# Patient Record
Sex: Male | Born: 1945
Health system: Southern US, Community
[De-identification: ages and names within clinical notes are randomized; demographics above are authoritative.]

## PROBLEM LIST (undated history)

## (undated) DIAGNOSIS — S060XAA Concussion with loss of consciousness status unknown, initial encounter: Secondary | ICD-10-CM

## (undated) DIAGNOSIS — K4090 Unilateral inguinal hernia, without obstruction or gangrene, not specified as recurrent: Secondary | ICD-10-CM

## (undated) DIAGNOSIS — M353 Polymyalgia rheumatica: Secondary | ICD-10-CM

## (undated) DIAGNOSIS — M1990 Unspecified osteoarthritis, unspecified site: Secondary | ICD-10-CM

## (undated) DIAGNOSIS — R0600 Dyspnea, unspecified: Secondary | ICD-10-CM

## (undated) DIAGNOSIS — K219 Gastro-esophageal reflux disease without esophagitis: Secondary | ICD-10-CM

## (undated) DIAGNOSIS — S060X9A Concussion with loss of consciousness of unspecified duration, initial encounter: Secondary | ICD-10-CM

## (undated) DIAGNOSIS — Z8679 Personal history of other diseases of the circulatory system: Secondary | ICD-10-CM

## (undated) DIAGNOSIS — I480 Paroxysmal atrial fibrillation: Secondary | ICD-10-CM

## (undated) DIAGNOSIS — Z9889 Other specified postprocedural states: Secondary | ICD-10-CM

## (undated) DIAGNOSIS — I10 Essential (primary) hypertension: Secondary | ICD-10-CM

## (undated) DIAGNOSIS — R112 Nausea with vomiting, unspecified: Secondary | ICD-10-CM

## (undated) DIAGNOSIS — Z91018 Allergy to other foods: Secondary | ICD-10-CM

## (undated) DIAGNOSIS — E785 Hyperlipidemia, unspecified: Secondary | ICD-10-CM

## (undated) DIAGNOSIS — G4730 Sleep apnea, unspecified: Secondary | ICD-10-CM

## (undated) DIAGNOSIS — T4145XA Adverse effect of unspecified anesthetic, initial encounter: Secondary | ICD-10-CM

## (undated) DIAGNOSIS — I499 Cardiac arrhythmia, unspecified: Secondary | ICD-10-CM

## (undated) DIAGNOSIS — I341 Nonrheumatic mitral (valve) prolapse: Secondary | ICD-10-CM

## (undated) DIAGNOSIS — J8410 Pulmonary fibrosis, unspecified: Secondary | ICD-10-CM

## (undated) DIAGNOSIS — T8859XA Other complications of anesthesia, initial encounter: Secondary | ICD-10-CM

## (undated) DIAGNOSIS — R7303 Prediabetes: Secondary | ICD-10-CM

## (undated) DIAGNOSIS — R079 Chest pain, unspecified: Secondary | ICD-10-CM

## (undated) DIAGNOSIS — K929 Disease of digestive system, unspecified: Secondary | ICD-10-CM

## (undated) HISTORY — DX: Chest pain, unspecified: R07.9

## (undated) HISTORY — DX: Unilateral inguinal hernia, without obstruction or gangrene, not specified as recurrent: K40.90

## (undated) HISTORY — PX: APPENDECTOMY: SHX54

## (undated) HISTORY — PX: BACK SURGERY: SHX140

## (undated) HISTORY — PX: TONSILLECTOMY: SUR1361

## (undated) HISTORY — PX: DG GREAT TOE LEFT FOOT: HXRAD1656

## (undated) HISTORY — PX: EYE SURGERY: SHX253

## (undated) HISTORY — DX: Paroxysmal atrial fibrillation: I48.0

## (undated) HISTORY — PX: COLONOSCOPY: SHX174

---
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
## 1997-05-14 HISTORY — PX: OTHER SURGICAL HISTORY: SHX169

## 1999-05-15 HISTORY — PX: SHOULDER OPEN ROTATOR CUFF REPAIR: SHX2407

## 1999-05-15 HISTORY — PX: CARDIAC CATHETERIZATION: SHX172

## 1999-05-23 ENCOUNTER — Inpatient Hospital Stay (HOSPITAL_COMMUNITY): Admission: EM | Admit: 1999-05-23 | Discharge: 1999-05-25 | Payer: Self-pay | Admitting: Emergency Medicine

## 2001-11-17 ENCOUNTER — Encounter: Payer: Self-pay | Admitting: Family Medicine

## 2001-11-17 ENCOUNTER — Ambulatory Visit (HOSPITAL_COMMUNITY): Admission: RE | Admit: 2001-11-17 | Discharge: 2001-11-17 | Payer: Self-pay | Admitting: Family Medicine

## 2001-11-18 ENCOUNTER — Encounter: Payer: Self-pay | Admitting: Family Medicine

## 2001-11-18 ENCOUNTER — Ambulatory Visit (HOSPITAL_COMMUNITY): Admission: RE | Admit: 2001-11-18 | Discharge: 2001-11-18 | Payer: Self-pay | Admitting: Family Medicine

## 2001-12-31 ENCOUNTER — Encounter: Payer: Self-pay | Admitting: Orthopedic Surgery

## 2001-12-31 ENCOUNTER — Ambulatory Visit (HOSPITAL_COMMUNITY): Admission: RE | Admit: 2001-12-31 | Discharge: 2001-12-31 | Payer: Self-pay | Admitting: Orthopedic Surgery

## 2002-05-05 ENCOUNTER — Encounter: Payer: Self-pay | Admitting: Family Medicine

## 2002-05-05 ENCOUNTER — Ambulatory Visit (HOSPITAL_COMMUNITY): Admission: RE | Admit: 2002-05-05 | Discharge: 2002-05-05 | Payer: Self-pay | Admitting: Family Medicine

## 2002-05-14 HISTORY — PX: CHOLECYSTECTOMY: SHX55

## 2002-07-02 ENCOUNTER — Ambulatory Visit (HOSPITAL_COMMUNITY): Admission: RE | Admit: 2002-07-02 | Discharge: 2002-07-02 | Payer: Self-pay | Admitting: General Surgery

## 2003-02-01 ENCOUNTER — Ambulatory Visit (HOSPITAL_COMMUNITY): Admission: RE | Admit: 2003-02-01 | Discharge: 2003-02-01 | Payer: Self-pay | Admitting: Family Medicine

## 2003-02-01 ENCOUNTER — Encounter: Payer: Self-pay | Admitting: Family Medicine

## 2003-03-12 ENCOUNTER — Ambulatory Visit (HOSPITAL_COMMUNITY): Admission: RE | Admit: 2003-03-12 | Discharge: 2003-03-12 | Payer: Self-pay | Admitting: Neurological Surgery

## 2003-03-23 ENCOUNTER — Encounter (HOSPITAL_COMMUNITY): Admission: RE | Admit: 2003-03-23 | Discharge: 2003-04-22 | Payer: Self-pay | Admitting: Neurological Surgery

## 2003-06-10 ENCOUNTER — Ambulatory Visit (HOSPITAL_COMMUNITY): Admission: RE | Admit: 2003-06-10 | Discharge: 2003-06-10 | Payer: Self-pay | Admitting: Internal Medicine

## 2003-06-17 ENCOUNTER — Encounter (HOSPITAL_COMMUNITY): Admission: RE | Admit: 2003-06-17 | Discharge: 2003-07-17 | Payer: Self-pay | Admitting: Neurological Surgery

## 2003-07-19 ENCOUNTER — Encounter (HOSPITAL_COMMUNITY): Admission: RE | Admit: 2003-07-19 | Discharge: 2003-08-18 | Payer: Self-pay | Admitting: Neurological Surgery

## 2004-04-24 ENCOUNTER — Ambulatory Visit (HOSPITAL_COMMUNITY): Admission: RE | Admit: 2004-04-24 | Discharge: 2004-04-24 | Payer: Self-pay | Admitting: Family Medicine

## 2004-10-06 ENCOUNTER — Ambulatory Visit (HOSPITAL_COMMUNITY): Admission: RE | Admit: 2004-10-06 | Discharge: 2004-10-06 | Payer: Self-pay | Admitting: Family Medicine

## 2005-08-13 ENCOUNTER — Ambulatory Visit (HOSPITAL_COMMUNITY): Admission: RE | Admit: 2005-08-13 | Discharge: 2005-08-13 | Payer: Self-pay | Admitting: Family Medicine

## 2005-09-28 ENCOUNTER — Encounter (HOSPITAL_COMMUNITY): Admission: RE | Admit: 2005-09-28 | Discharge: 2005-10-18 | Payer: Self-pay | Admitting: Orthopedic Surgery

## 2005-10-22 ENCOUNTER — Ambulatory Visit: Payer: Self-pay | Admitting: Internal Medicine

## 2005-10-22 ENCOUNTER — Observation Stay (HOSPITAL_COMMUNITY): Admission: EM | Admit: 2005-10-22 | Discharge: 2005-10-23 | Payer: Self-pay | Admitting: Emergency Medicine

## 2005-10-25 ENCOUNTER — Encounter (HOSPITAL_COMMUNITY): Admission: RE | Admit: 2005-10-25 | Discharge: 2005-11-24 | Payer: Self-pay | Admitting: Orthopedic Surgery

## 2005-11-26 ENCOUNTER — Encounter (HOSPITAL_COMMUNITY): Admission: RE | Admit: 2005-11-26 | Discharge: 2005-12-26 | Payer: Self-pay | Admitting: Orthopedic Surgery

## 2006-01-10 ENCOUNTER — Ambulatory Visit (HOSPITAL_COMMUNITY): Admission: RE | Admit: 2006-01-10 | Discharge: 2006-01-10 | Payer: Self-pay | Admitting: Family Medicine

## 2006-01-11 ENCOUNTER — Encounter (HOSPITAL_COMMUNITY): Admission: RE | Admit: 2006-01-11 | Discharge: 2006-02-09 | Payer: Self-pay | Admitting: Family Medicine

## 2006-12-05 ENCOUNTER — Ambulatory Visit (HOSPITAL_COMMUNITY): Admission: RE | Admit: 2006-12-05 | Discharge: 2006-12-05 | Payer: Self-pay | Admitting: Gastroenterology

## 2007-01-22 ENCOUNTER — Ambulatory Visit (HOSPITAL_COMMUNITY): Admission: RE | Admit: 2007-01-22 | Discharge: 2007-01-22 | Payer: Self-pay | Admitting: Surgery

## 2007-01-22 ENCOUNTER — Encounter (INDEPENDENT_AMBULATORY_CARE_PROVIDER_SITE_OTHER): Payer: Self-pay | Admitting: Surgery

## 2007-04-21 ENCOUNTER — Ambulatory Visit (HOSPITAL_COMMUNITY): Admission: RE | Admit: 2007-04-21 | Discharge: 2007-04-21 | Payer: Self-pay | Admitting: Family Medicine

## 2007-12-01 ENCOUNTER — Encounter: Admission: RE | Admit: 2007-12-01 | Discharge: 2007-12-01 | Payer: Self-pay | Admitting: Family Medicine

## 2007-12-03 ENCOUNTER — Encounter: Admission: RE | Admit: 2007-12-03 | Discharge: 2007-12-03 | Payer: Self-pay | Admitting: Family Medicine

## 2008-01-06 ENCOUNTER — Encounter: Admission: RE | Admit: 2008-01-06 | Discharge: 2008-01-06 | Payer: Self-pay | Admitting: Gastroenterology

## 2008-01-23 ENCOUNTER — Encounter: Admission: RE | Admit: 2008-01-23 | Discharge: 2008-01-23 | Payer: Self-pay | Admitting: Gastroenterology

## 2008-01-29 ENCOUNTER — Ambulatory Visit (HOSPITAL_COMMUNITY): Admission: RE | Admit: 2008-01-29 | Discharge: 2008-01-29 | Payer: Self-pay | Admitting: Gastroenterology

## 2009-02-14 ENCOUNTER — Ambulatory Visit (HOSPITAL_COMMUNITY): Admission: RE | Admit: 2009-02-14 | Discharge: 2009-02-14 | Payer: Self-pay | Admitting: Family Medicine

## 2009-02-15 ENCOUNTER — Ambulatory Visit (HOSPITAL_COMMUNITY): Admission: RE | Admit: 2009-02-15 | Discharge: 2009-02-15 | Payer: Self-pay | Admitting: Family Medicine

## 2009-04-04 ENCOUNTER — Ambulatory Visit (HOSPITAL_COMMUNITY): Admission: RE | Admit: 2009-04-04 | Discharge: 2009-04-04 | Payer: Self-pay | Admitting: Family Medicine

## 2010-03-14 ENCOUNTER — Ambulatory Visit: Payer: Self-pay | Admitting: Internal Medicine

## 2010-03-20 ENCOUNTER — Ambulatory Visit (HOSPITAL_COMMUNITY): Admission: RE | Admit: 2010-03-20 | Discharge: 2010-03-20 | Payer: Self-pay | Admitting: Internal Medicine

## 2010-06-07 ENCOUNTER — Ambulatory Visit (HOSPITAL_COMMUNITY)
Admission: RE | Admit: 2010-06-07 | Discharge: 2010-06-07 | Payer: Self-pay | Source: Home / Self Care | Attending: Internal Medicine | Admitting: Internal Medicine

## 2010-06-20 ENCOUNTER — Ambulatory Visit (INDEPENDENT_AMBULATORY_CARE_PROVIDER_SITE_OTHER): Payer: 59 | Admitting: Internal Medicine

## 2010-06-20 DIAGNOSIS — R197 Diarrhea, unspecified: Secondary | ICD-10-CM

## 2010-06-26 NOTE — Consult Note (Signed)
NAMEMOISES, TERPSTRA             ACCOUNT NO.:  [id number]  MEDICAL RECORD NO.:  [id number]          PATIENT TYPE:  OUT  LOCATION:  RAD                           FACILITY:  [facility]  PHYSICIAN:  Lionel December, M.D.    DATE OF BIRTH:  Dec 25, 1945  DATE OF CONSULTATION: DATE OF DISCHARGE:  06/07/2010                                CONSULTATION   PRESENTING COMPLAINT:  Followup for diarrhea, bloating, and burping.  Last visit was on March 14, 2010.  SUBJECTIVE:  The patient is here for scheduled visit.  He is 65 year old Caucasian male, patient of Dr. Sudie Bailey, who has been struggling with recurrent bouts of diarrhea, bloating, and frequent burping.  He has undergone extensive workup and all the studies been negative, only thing that was abnormal last year was CRP of 8 and it came down when he was treated with prednisone.  Only therapy that has provided relief to his symptoms is prednisone.  This initially was given to him because he was having back pain.  He recently had relapse of his symptoms and he was asked to go back on prednisone.  He states when he dropped the dose to 15 mg, his symptoms relapsed.  He recalls the day he was having small bowel x-ray, he was burping like crazy.  He says now he is back on 20 mg of prednisone and feeling 80% better.  He is still burping but not as much.  He is having 3 to 4 stools per day, stools are loose to firm. Before prednisone, he was having as many as 8 stools per day.  He is having excessive flatulence.  He states when he gets sick, he also experiences severe cramps across the abdomen.  Following his last visit, he went on a gluten-free diet for 1 month.  He states he was 100% compliant and it did not make any difference.  He has not lost any weight.  He is very concerned about side effects from prednisone.  Bone density study in November and he was noted to have osteoporosis.  He had a T-score of -2.7 at neck of left femur, score at [facility]  spine was -0.3%. He is presently on calcium with vitamin D and doing some walking and exercise.  He is not having any problems with skin rash, fever, or chills.  He denies melena or rectal bleeding.  He is not having any dysuria or hematuria.  He states he drinks 4 glasses of red wine every day.  CURRENT MEDICATIONS:  Atenolol 25 mg b.i.d., Norvasc 2.5 mg daily, ASA 81 mg daily, Crestor 2.5 mg daily, B6 100 mg daily, cetirizine 10 mg daily, sulfasalazine 500 mg q.i.d., Imodium 2 mg t.i.d., prednisone 20 mg daily, promethazine 25 mg daily p.r.n., Protonix 40 mg daily p.r.n., carisoprodol 350 mg at bedtime, Citracal one daily, vitamin D3 2000 units daily.  OBJECTIVE:  VITAL SIGNS:  Weight 186 pounds, which is stable.  He is 69 inches tall, pulse 82 per minute, blood pressure 120/80, temperature is 98.7. GENERAL:  He does not appear to be in any distress. HEENT:  Conjunctivae are pink.  Sclerae are nonicteric.  Oropharyngeal mucosa is normal. NECK:  No neck masses or thyromegaly noted. ABDOMEN:  Full.  Bowel sounds are normal on palpation, soft.  Abdomen without tenderness, organomegaly, or masses.  Percussion note is normal.  LABORATORY DATA:  From June 12, 2010, his B12 level was 252.  June 02, 2010, WBC 8.0, H and H 16.6 and 48.7, MCV 107.  His sed rate was 1, CRP was 0.1 which is normal.  Small-bowel follow-through on June 07, 2010, was within normal limits.  Contrast reached the right and transverse colon in 2 hours.  ASSESSMENT:  Si remains with diarrhea, bloating, and frequent belching. In the past, he has failed therapy for irritable bowel syndrome.  He has also failed therapy with Xifaxan and another antibiotics.  His breath test was negative for small intestine bacterial overgrowth.  He has had esophagogastroduodenoscopy with duodenal biopsy on 2 occasions as well as celiac antibody panel and at least 2 colonoscopies, all of these have been negative.  Last year, he  had MR enterography, which was normal.  Second opinion was sought from Dr. [name] of [facility] and he did not recommend small bowel enteroscopy.  I noticed that his MCV is gradually going up.  His CRP, which previously was elevated, is now normal.  I wonder if his symptoms are secondary to an enterocyte toxicity due to red wine or he could be allergic or intolerant of some ingredients in red wine.  Since nothing else has been found, I would like him to get off red wine and see if he can control his symptoms and he can come off prednisone for good.  RECOMMENDATIONS:  The patient will try to quit drinking red wine altogether.  Prescription given for Librium 25 mg t.i.d. p.r.n., 60 with the refill.  Also start him on Levbid 1 tablet 30 minutes before breakfast daily, prescription given for 30 with 5 refills.  He will have stool studies for WBC, C. diff, O and P, and culture sensitivity when his diarrhea gets worse.  He will start his taper at 5 mg per week.  Review progress over the phone in 2 weeks or so and plan to see him back in few weeks.     Lionel December, M.D.     NR/MEDQ  D:  06/21/2010  T:  06/21/2010  Job:  [id number]  cc:   Mila Homer. Sudie Bailey, M.D. Fax: [phone]  Electronically Signed by Lionel December M.D. on 06/26/2010 11:12:29 AM

## 2010-07-04 ENCOUNTER — Ambulatory Visit (INDEPENDENT_AMBULATORY_CARE_PROVIDER_SITE_OTHER): Payer: Self-pay | Admitting: Internal Medicine

## 2010-08-21 ENCOUNTER — Ambulatory Visit (INDEPENDENT_AMBULATORY_CARE_PROVIDER_SITE_OTHER): Payer: 59 | Admitting: Internal Medicine

## 2010-08-21 DIAGNOSIS — R197 Diarrhea, unspecified: Secondary | ICD-10-CM

## 2010-08-22 ENCOUNTER — Ambulatory Visit (INDEPENDENT_AMBULATORY_CARE_PROVIDER_SITE_OTHER): Payer: 59 | Admitting: Internal Medicine

## 2010-09-08 NOTE — Consult Note (Signed)
[name], [name]             ACCOUNT NO.:  [id number]  MEDICAL RECORD NO.:  [id number]           PATIENT TYPE:  LOCATION:                                 FACILITY:  PHYSICIAN:  Lionel December, M.D.    DATE OF BIRTH:  1945/06/22  DATE OF CONSULTATION:  08/21/2010 DATE OF DISCHARGE:                                CONSULTATION   PRESENTING COMPLAINT:  Persistent diarrhea.  SUBJECTIVE:  The patient is a 65 year old Caucasian male patient of Dr. Sudie Bailey who was last seen 2 months ago.  He has had extensive studies for his diarrhea and they have all been negative except elevated CRP on one occasion was normal in January at 0.1.  He has not responded to antispasmodics, antidiarrheals, antibiotics, but he always has responded to prednisone.  He had repeat small bowel study in January this year and was within normal limits.  On his last visit, I asked him to cut back on his alcohol intake to no more than 2 drinks a day.  He was asked to take Librium on p.r.n. basis.  He was begun on Levbid.  He has stool studies and all of these were negative consisting of C. diff by PCR, O and P cultures and lactoferrin.  Few weeks ago, we also called in a prescription for Apriso for him.  As we discussed, he has been keeping a stool diary.  He stopped his prednisone on July 28, 2010, and he is only on 2.5 mg daily.  He has been keeping diary from July 31, 2010, through today.  He was having anywhere from 4-6 loose to watery stools per day.  He took 5 mg of prednisone on August 05, 2010, and he had one loose stool that day and on the following day, he had two loose stools.  He took 10 mg of prednisone on 3 and April 4 and on the 5, he had one normal stool all day long and now he is back on low-dose prednisone.  He took 5 mg and 6 and 2.5 daily thereafter and he has had normal to loose stools, but one to three per day.  He has also been keeping diary of the times.  Almost all of his bowel  movements occur prior to 12 o'clock and very rarely in the afternoon.  He states when he has diarrhea, he exploited and he starts burping.  He states he has to take prednisone, otherwise he cannot function and just gets out and feels exhausted.  When he has normal stools, they are like they have been in the past and at times he has caliber thin long stools, but other times they are loose watery and mushy.  He has not passed any blood.  His appetite is good and he has been maintaining his weight.  In fact, he has gained 5 pounds in the last 8 weeks.  He states that [name] has not helped him, it has given him heartburn.  He also has not seen any improvement with Imodium and Levbid.  CURRENT MEDICATIONS: 1. Atenolol 25 mg p.o. b.i.d. 2. Norvasc 2.5 mg daily. 3. ASA 81 mg  daily. 4. Crestor 2.5 mg daily. 5. Vitamin B 600 mg daily. 6. Cetirizine 10 mg daily p.r.n. 7. Sulfasalazine 500 mg p.o. q.i.d. 8. Imodium 2 mg 3-4 times a day. 9. Prednisone 2.5 mg daily presently. 10.Promethazine 25 mg daily p.r.n. 11.Protonix 40 mg p.o. daily p.r.n. 12.Vitamin D3 2000 units daily. 13.Apriso 1.5 g daily. 14.Levbid 1 tablet every morning.  OBJECTIVE:  VITAL SIGNS:  Weight 191 pounds, he is 69 inches tall, pulse 72 per minute, blood pressure 128/70 and temp is 97.4. EYES:  Conjunctivae are pink.  Sclerae are nonicteric. MOUTH:  Oropharyngeal mucosa is normal. NECK:  No neck masses or thyromegaly noted. ABDOMEN:  Symmetrical.  Bowel sounds are normal on palpation, soft abdomen without tenderness, organomegaly or masses. EXTREMITIES:  No peripheral edema or clubbing noted.  ASSESSMENT:  The patient has had diarrhea for more than 2 years.  He has had EGD with duodenal biopsy x2.  Two colonoscopies, breath test for small intestine bacterial overgrowth, small-bowel follow-through MR enterography, multiple stool studies and they have all been negative. He has not responded to antispasmodics,  antidiarrheals, Xifaxan.  Only test that was abnormal was CRP of 8 at one point and 3 months ago it was 0.1 which is normal.  At the time, I felt his CRP may be elevated because of unknown inflammatory bowel disease, but it is possible that CRP was up because of his RA and not necessarily of GI origin.  It is still possible that he has irritable bowel syndrome, although he has not responded to therapy.  PLAN:  He will discontinue Apriso, Imodium and Levbid.  We will start him on Lomotil 2 tablets twice daily prescription given for 120 doses.  We will proceed with 24-hour stool collection for stool volume and fat. I asked the patient that when he is ready to collect stool, he will need to be on regular diet for at least 1 week and he should also hold off his antidiarrheals for few days before and while he is collecting stool specimen.  He can try to come off the prednisone as soon as he could.     Lionel December, M.D.     NR/MEDQ  D:  08/21/2010  T:  08/22/2010  Job:  [id number]  cc:   Mila Homer. Sudie Bailey, M.D. Fax: [phone]  Aundra Dubin, M.D. [address] [place] [address]  Electronically Signed by Lionel December M.D. on 09/08/2010 11:29:16 AM

## 2010-09-12 ENCOUNTER — Ambulatory Visit (INDEPENDENT_AMBULATORY_CARE_PROVIDER_SITE_OTHER): Payer: 59 | Admitting: Internal Medicine

## 2010-09-26 NOTE — Op Note (Signed)
[name], [name]             ACCOUNT NO.:  [id number]   MEDICAL RECORD NO.:  [id number]          PATIENT TYPE:  AMB   LOCATION:  DAY                          FACILITY:  [facility]   PHYSICIAN:  Currie Paris, M.D.DATE OF BIRTH:  [birth year]   DATE OF PROCEDURE:  01/22/2007  DATE OF DISCHARGE:                               OPERATIVE REPORT   PREOPERATIVE DIAGNOSIS:  Chronic calculus cholecystitis.   POSTOPERATIVE DIAGNOSIS:  Chronic calculus cholecystitis.   OPERATION:  Laparoscopic cholecystectomy with intraoperative  choloangiogram.   SURGEON:  Currie Paris, M.D.   ASSISTANT:  Lennie Muckle, M.D.   ANESTHESIA:  General endotracheal.   CLINICAL HISTORY:  This is a 65 year old man with known gallstones and  biliary type symptoms.  We elected to proceed with cholecystectomy.   DESCRIPTION OF PROCEDURE:  The patient was seen in the holding area and  had no further questions.  We confirmed cholecystectomy as the planned  procedure.  The patient was taken to the operating room and after  satisfactory general endotracheal anesthesia had been obtained, the  abdomen was clipped, prepped and draped.  The time out was done.   An umbilical incision was made, the fascia identified and opened, and  the peritoneal cavity entered under direct vision.  A pursestring was  placed, the [place] introduced, and the abdomen insufflated 15.  A camera  was placed and there was obvious adhesions around the gallbladder as  well as a few around the right colon where he had a prior appendectomy.  The patient was placed in reverse Trendelenburg and tilted to the left.  A 10/11 trocar was placed in the epigastrium and two 5 mm in the right  upper quadrant.   The omental adhesions were taken down. The gallbladder was retracted  over the liver.  The peritoneum in the area of the triangle of Calot was  opened and I dissected out a long segment of cystic duct and cystic  artery and put one clip  on each.  The cyst duct was opened and a Cook  catheter introduced and operative angiography done.  This was normal.  The catheter was removed and three clips were placed on the cystic duct.  The cystic duct was divided.  Additional clips were placed on the cystic  artery and it was divided.   A posterior branch was clipped and divided and the gallbladder removed  from below to above with coagulation current cautery.  It was placed  into a bag and we spent a lot of time irrigating as there had been a  little bit of bile spillage.  Once everything appeared to be completely  clear, the gallbladder was brought out the umbilical port.  We made a  final check for hemostasis and then removed the lateral ports.  The  umbilical site was closed with the  pursestring.  The air was deflated through the epigastric port.  The  skin was closed with 4-0 Monocryl subcuticular plus Dermabond.  The  patient tolerated the procedure well and there were no operative  complications.  All counts were correct.  Currie Paris, M.D.  Electronically Signed     CJS/MEDQ  D:  01/22/2007  T:  01/22/2007  Job:  [id number]   cc:   Mila Homer. Sudie Bailey, M.D.  Fax: [phone]   Petra Kuba, M.D.  Fax: [phone]

## 2010-09-29 NOTE — H&P (Signed)
   NAMEDEQUON, SCHNEBLY                         ACCOUNT NO.:  [id number]   MEDICAL RECORD NO.:  [id number]                  PATIENT TYPE:   LOCATION:                                       FACILITY:   PHYSICIAN:  Dalia Heading, M.D.               DATE OF BIRTH:  May 20, 1945   DATE OF ADMISSION:  DATE OF DISCHARGE:                                HISTORY & PHYSICAL   CHIEF COMPLAINT:  Diverticulitis.   HISTORY OF PRESENT ILLNESS:  This patient is a 65 year old white male who is  referred for colonoscopy.  He recently was treated for CT scan, confirmed  left-sided diverticulitis.  His last episode was 1 week ago.  No fever or  chills have been noted.  Occasional diarrhea had been noted.  He is  finishing his antibiotic course. He denies any lightheadedness, weight loss,  fever, abdominal pain, constipation, hematochezia, or melena. He has never  had a colonoscopy.  There is no immediate family history of colon carcinoma   PAST MEDICAL HISTORY:  Past medical history includes hypertension.   PAST SURGICAL HISTORY:  Past surgical history bone spur surgery,  appendectomy, torn rotator cuff repair on the left shoulder.   CURRENT MEDICATIONS:  Atenolol, Norvasc, baby aspirin, glucosamine,  Pravachol.   ALLERGIES:  Xylocaine and codeine.   REVIEW OF SYSTEMS:  Noncontributory.   PHYSICAL EXAMINATION:  GENERAL:  On physical examination the patient is a  well-developed, well-nourished white male in no acute distress.  VITAL SIGNS:  He is afebrile and vital signs are stable.  LUNGS:  Clear to auscultation with equal breath sounds bilaterally.  HEART:  Heart examination reveals a regular rate and rhythm without S3, S4,  or murmurs.  ABDOMEN:  The abdomen is soft, nontender, nondistended.  No  hepatosplenomegaly, masses are noted.  RECTAL:  Rectal examination was deferred to the procedure.   IMPRESSION:  Diverticulitis.    PLAN:  The patient is scheduled for colonoscopy on 07/02/02.   The risks and  benefits of the procedure including bleeding and perforation were fully  explained to the patient who gave informed consent.                                               Dalia Heading, M.D.    MAJ/MEDQ  D:  06/11/2002  T:  06/11/2002  Job:  [id number]   cc:   Patrica Duel, M.D.  [address], [address]  [place]  [place] [address]  Fax: [phone]

## 2010-09-29 NOTE — H&P (Signed)
NAMEDENNISE, BAMBER             ACCOUNT NO.:  [id number]   MEDICAL RECORD NO.:  [id number]          PATIENT TYPE:  EMS   LOCATION:  ED                            FACILITY:  [facility]   PHYSICIAN:  Madelin Rear. Sherwood Gambler, MD  DATE OF BIRTH:  1945-07-01   DATE OF ADMISSION:  10/22/2005  DATE OF DISCHARGE:  LH                                HISTORY & PHYSICAL   CHIEF COMPLAINT:  Abdominal pain.   HISTORY OF PRESENT ILLNESS:  Patient presented to the emergency department  after being awakened in the middle of the night by abdominal pain.  He was  fine when he went to bed; however, woke up with crescendo type central  epigastric abdominal crampy type pain.  He had no emesis, no hematemesis,  hematochezia, or melena.  He had no fever, rigors, or chills.  No frequency,  urgency, or dysuria.   PAST MEDICAL HISTORY:  He has had viral cardiomyopathy evaluated by  cardiology in the remote past with a catheterization in the past showing no  coronary artery disease and normal left ventricular function.  He was noted  at that time of catheterization to have angiographic evidence of mitral  valve prolapse.  He was maintained on calcium channel blocker and beta  blocker therapy.  He has had hyperlipidemia maintained on Zocor.  He has  also had previous episodes of gastroesophageal reflux disease, although he  is not currently on any antacid regimen.  He is recently status post rotator  cuff orthopedic surgery, in fact, due for a follow-up postoperative visit  tomorrow in his orthopedist office.   SOCIAL HISTORY:  No smoking, drinking, or alcohol.  No drug use.   REVIEW OF SYSTEMS:  As under HPI, also negative.   FAMILY HISTORY:  Noncontributory.   PHYSICAL EXAMINATION:  SKIN:  Unremarkable.  HEENT:  No JVD or adenopathy.  Neck is supple.  CHEST:  Clear.  CARDIAC:  Regular rate and rhythm without murmur, gallop, or rub.  ABDOMEN:  Soft.  No organomegaly or masses.  No guarding or rebound  tenderness.  EXTREMITIES:  Without clubbing, cyanosis, edema.  NEUROLOGIC:  Nonfocal.   LABORATORIES:  Normal CBC.  Normal amylase and lipase, although liver  function tests are conspicuously absent on the ER laboratory sheet and will  be added and reviewed when available.  Ultrasonography shows by my look at  the radiographs to show gallstones, however, official radiologic  interpretation is pending at present.  The CT scan was interpreted by Dr.  __________ and this was interpreted as having mesenteric stranding specific  with nonspecific inflammation.  There was specifically no evidence of  abscess, perforation according to the radiology report.   IMPRESSION:  1.  Abdominal pain, question etiology.  He does have evidence of mesenteric      inflammation.  At present, his pain is under control.  We will admit him      for observation, gastrointestinal, as well as possible surgical      consultation, although right now other than his gallstones there does      not appear to be  active surgical disease.  2.  Cardiomyopathy in the past.  Right now he is well compensated.  We will      maintain his outpatient regimen as usual.  3.  Status post orthopedic/rotator cuff surgery.  He will have to put his      appointment off and he will make arrangements to do that.      Madelin Rear. Sherwood Gambler, MD  Electronically Signed     LJF/MEDQ  D:  10/22/2005  T:  10/22/2005  Job:  [id number]

## 2010-09-29 NOTE — Consult Note (Signed)
[name], [name]             ACCOUNT NO.:  [id number]   MEDICAL RECORD NO.:  [id number]          PATIENT TYPE:  INP   LOCATION:  A311                          FACILITY:  [facility]   PHYSICIAN:  R. Roetta Sessions, M.D. DATE OF BIRTH:  [birth year]   DATE OF CONSULTATION:  10/22/2005  DATE OF DISCHARGE:                                   CONSULTATION   REASON FOR CONSULTATION:  Abdominal pain, abnormal ultrasound findings.   HISTORY OF PRESENT ILLNESS:  Mr. Taggart Prasad was a 65 year old gentleman  in his usual state of fairly good health until he was awoken in the early  morning hours this morning out of a sound sleep with boring epigastric pain  and was prominent and he describes it as severe.  He got no relief with over-  the-counter Zantac, came to the Emergency Department where he was evaluated  further and subsequently admitted.  Workup this far included CT of the  abdomen and pelvis along with ultrasound and HIDA scan.  CT shows some  nonspecific mesenteric stranding.  Ultrasound demonstrated prominent sludge  in the gallbladder without gallbladder wall thickening or pericholecystic  fluid.  HIDA scan demonstrated very sluggish filling of the gallbladder,  upwards of 1 hour, with a diminished gallbladder ejection fraction of  approximately 11-12%.  Mr. Sarr was given some pain medication and  within a couple of hours of coming to the Emergency Department his symptoms  are totally resolved, he is hungry and he wants to eat.   ADMITTING LABORATORY DATA:  Include a white count of 6.8, H&H of 15.8 and  45.3.  The LFTs were notably normal with total bilirubin of 0.9 on  admission.  They were repeated at 1330 today and his total bilirubin is 1.8.  However, his transaminases and alkaline phosphatase have remained normal.  Amylase, lipase normal on admission.   PAST MEDICAL HISTORY:  Significant for:  1.  Recent right rotator cuff repair by Dr. Thurston Hole, down in [place].  He   is 4 weeks out from surgery.  2.  History of viral cardiomyopathy with apparent good recovery in cardiac      function.  Prior cardiac cath demonstrated some mitral valve prolapse      only.  3.  He was told he had diverticulitis when he had abdominal pain back in      2004.  He describes Dr. Lovell Sheehan doing a colonoscopy without significant      findings.  4.  He also has a hypertension.  5.  History of hyperlipidemia.   MEDICATIONS ON ADMISSION:  Atenolol, Norvasc, aspirin, Allegra, Zocor.   ALLERGIES:  1.  CODEINE.  2.  XYLOCAINE.   FAMILY HISTORY:  Noncontributory.   SOCIAL HISTORY:  Patient does not smoke or drink.  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   REVIEW OF SYSTEMS:  No chest pain or dyspnea on exertion.  No fever, chills,  no change in weight.  No melena, rectal bleeding.  No odynophagia, dysphagia  or early satiety, reflux symptoms.   PHYSICAL EXAMINATION:  Reveals a pleasant 65 year old gentleman resting  comfortably.  Temperature 98.2, pulse 78, respiratory rate 18, BP 107/67,  admission weight 182.7 pounds.  Skin warm and dry, there is no jaundice.  No  continuous stigmata of chronic liver disease.  HEENT EXAMINATION:  No scleral icterus, JVD is not prominent.  CHEST:  Lungs are clear to auscultation.  CARDIAC EXAM:  Regular rate and rhythm without murmur, gallop or rub.  ABDOMEN:  Nondistended, positive bowel sounds, soft, nontender, no  appreciable mass or organomegaly.   ADDITIONAL LABORATORY DATA:  Sodium 135, potassium 3.5, chloride 106,  glucose 110, BUN 9, creatinine 1.  LFTs at 1330:  Total bilirubin 1.4,  direct troponin 0.2, indirect 1.2.  Alkaline phosphatase 80, AST 22, ALT 26,  total protein 5.8, albumin 3.3.   IMPRESSION:  Mr. Jaquavis Felmlee is a pleasant 65 year old gentleman who has  been admitted to the hospital with a  self-limiting bout of upper abdominal  pain.  Most likely this is secondary to biliary colic.  He has prominent  sludge in his gallbladder seen on ultrasound and had a slow-to-fill  gallbladder and a markedly diminished ejection fraction.  I suspect there is  an element of chronic cholecystitis and biliary dyskinesia present.   This was apparently his first attack.   He did have a mild bump in his bilirubin over a several hour period which  could be a telltale sign of some recent passage of sludge through his bile  duct.  At any rate, he looks pretty good now.   RECOMMENDATIONS:  I feel that Mr. Metzinger ought to go ahead and see a  surgeon electively, make plans to get his gallbladder out.  He tells me he  would like to have surgery down in [place] and wants to be referred to  one of the surgeons down there.   Will go ahead and advance him to a low-fat diet this evening and see how he  does.  Will make sure he is not having any problems before being allowed to  go home.   I want to thank Dr. Artis Delay for letting me see this nice gentleman.      Jonathon Bellows, M.D.  Electronically Signed     RMR/MEDQ  D:  10/22/2005  T:  10/22/2005  Job:  [id number]   cc:   Madelin Rear. Sherwood Gambler, MD  Fax: [phone]

## 2010-09-29 NOTE — Op Note (Signed)
   TNAMEJULES, BATY                      ACCOUNT NO.:  [id number]   MEDICAL RECORD NO.:  [id number]                   PATIENT TYPE:  AMB   LOCATION:  DAY                                  FACILITY:  [facility]   PHYSICIAN:  Sherri Rad, M.D.               DATE OF BIRTH:  03/13/1946   DATE OF PROCEDURE:  12/31/2001  DATE OF DISCHARGE:                                 OPERATIVE REPORT   PREOPERATIVE DIAGNOSIS:  Left hallux rigidus.   POSTOPERATIVE DIAGNOSIS:  Left hallux rigidus.   OPERATION:  Left great toe cheilectomy.   ANESTHESIA:  General endotracheal tube.   TOURNIQUET TIME:  17 minutes.   SURGEON:  Sherri Rad, M.D.   ASSISTANT:  None.   COMPLICATIONS:  None.   DISPOSITION:  Stable to PAR.   INDICATION:  This is a 65 year old gentleman, who approximately three months  ago, had a right great toe cheilectomy for hallux rigidus and has also had  symptoms involving the left great toe. very similar to the right.  He was  consented for the above procedure.  All risks which include infection,  neurovascular injury, stiffness, arthritis, same pain, worsening of pain  were all explained.  Questions were encouraged and answered.   DESCRIPTION OF OPERATION:  The patient was brought to the operating room and  placed in supine position.  After adequate general endotracheal tube  anesthesia was administered as well as Ancef 1 g IV piggyback, the left  lower extremity was then prepped and draped in a sterile manner over a  proximally-placed thigh tourniquet.  The left lower extremity was then  gravity exsanguinated and tourniquet was elevated 290 mmHg.  A longitudinal  incision medial to the EHL was then made.  Dissection was carried down  through skin.  Neurovascular bundle was protected superomedially, and the  EHL was protected laterally.  The capsule was then incised in line with the  incision.  There was a large spur dorsally.  This was then removed with the  sagittal saw as well as the spurs in both the medial and lateral gutters of  the joint as well.  The wound was copiously irrigated with normal saline.  Toe was ranged, and the range was excellent with almost 80 degrees of  dorsiflexion and no impinging areas.  The capsule was then closed with 3-0  Vicryl.  Skin was closed with 4-0 nylon after the tourniquet was deflated at  17 minutes.  Complications none.  Disposition stable to PAR.                                               Sherri Rad, M.D.    PAB/MEDQ  D:  12/31/2001  T:  12/31/2001  Job:  [id number]

## 2010-09-29 NOTE — Cardiovascular Report (Signed)
[facility]  Patient:    [name]                     MRN: [id number] Proc. Date: 05/24/99 Adm. Date:  [id number] Attending:  Virgina Evener CC:         Cardiac Catheterization Laboratory             Karleen Hampshire, M.D.             [place] [facility] - Dr. Ellin Goodie office                        Cardiac Catheterization  PROCEDURE:  Cardiac catheterization.  CARDIOLOGIST:  Lennette Bihari, M.D.  INDICATIONS:  Mr. Sena is a 65 year old white male with a remote history of an apparent viral cardiomyopathy in 1989, at which time he was found to have LV dysfunction and ventricular arrhythmia, for which he underwent a cardiac catheterization and an EP study, as well as an echocardiography at [facility].  The patient does have mitral valve prolapse.  Ultimately, his LV function has normalized.  He has done well with beta blockade and had been taking atenolol 75 mg q.d.  Yesterday, he developed significant substernal chest pressure, consistent with possible unstable angina that was nitrate-responsive.  He was admitted to [facility].  CPK enzymes have been negative.  He s now referred for a definitive cardiac catheterization.  Because of the LIDOCAINE allergy, the patient was premedicated and topical anesthesia was done with 1% Nesacaine (_______), rather than lidocaine, an amide.  HEMODYNAMIC DATA: Central aortic pressure:  95/60,. Left ventricular pressure:  94/14.  ANGIOGRAPHIC DATA: 1. Left main coronary artery:  Was angiographically normal and trifurcated    into an left anterior descending coronary artery, in an intermediate vessel    in the left circumflex coronary artery. 2. Left anterior descending coronary artery:  Gave rise to a proximal diagonal    vessel, and several proximal septal perforating arteries.  The mid-LAD    seemed to dip intramyocardially, and then there was evidence for  systolic    muscle bridging in this intramyocardial segment.  During diastole, this    vessel appeared normal without obstruction.  During systole, it seemed    to narrow to approximately possibly 60% in some views, not seen on all views.    The remainder of the LAD was normal. 3. Intermediate vessel:  Was angiographically normal. 4. Circumflex vessel:  Gave rise to a proximal marginal vessel and otherwise    the AV groove portion was angiographically normal. 5. Right coronary artery:  Was angiographically normal and gave rise to the    PDA, and ended in a posterolateral system.  BIPLANE CINE LEFT VENTRICULOGRAPHY:  Revealed normal LV function.  There was ventricular ectopy during the left ventriculography.  There was evidence for angiographic mitral valve prolapse.  IMPRESSION: 1. Normal left ventricular function. 2. Angiographic mitral valve prolapse. 3. No evidence for obstructive coronary artery disease. 4. Mid-left anterior descending coronary artery muscle bridging in an    intramyocardial segment with normal appearance during diastole, and    narrowing up to 60% during systole.  RECOMMENDATIONS: 1. Addition of nitrate, versus calcium channel blockade. 2. Beta blocker regimen, as blood pressure tolerates it. DD:  05/24/99 TD:  05/24/99 Job: [id number]

## 2010-09-29 NOTE — Discharge Summary (Signed)
[name], [name]             ACCOUNT NO.:  [id number]   MEDICAL RECORD NO.:  [id number]          PATIENT TYPE:  INP   LOCATION:  A311                          FACILITY:  [facility]   PHYSICIAN:  Madelin Rear. Sherwood Gambler, MD  DATE OF BIRTH:  04-30-1946   DATE OF ADMISSION:  10/22/2005  DATE OF DISCHARGE:  06/12/2007LH                                 DISCHARGE SUMMARY   DISCHARGE DIAGNOSES:  1.  Acute on chronic cholelithiasis and cholecystitis.  2.  Abdominal pain with mesenteric stranding, question etiology.  3.  Previous history of viral cardiomyopathy, resolved and well compensated.   DISCHARGE MEDICATIONS:  1.  Norvasc 2.5 mg p.o. daily.  2.  Aspirin 81 mg p.o. daily.  3.  Tenormin 25 and 50 A.M. and P.M.  4.  Protonix 40 mg p.o. b.i.d.  5.  Zocor 20 mg daily.   HOSPITAL COURSE:  The patient was admitted with severe abdominal pain  requiring intravenous morphine for relief.  He had precious little in the  way of associated symptoms or prodromal symptoms.  Work up showed CT scan  which showed mesenteric stranding of uncertain significance in etiology.  Subsequent work up of his gallbladder did reveal gallbladder sludge,  thickened gallbladder wall, and markedly reduced ejection fraction.  His  symptoms spontaneously resolved with bowel rest.  He was requesting Dr.  Jamey Ripa or Dr. Ezzard Standing of [facility] for elective  outpatient cholecystectomy, anticipated to be laparoscopically.  These  arrangements will be made as an outpatient.  On the day of discharge he is  asymptomatic.      Madelin Rear. Sherwood Gambler, MD  Electronically Signed     LJF/MEDQ  D:  10/23/2005  T:  10/23/2005  Job:  [id number]

## 2011-02-23 LAB — COMPREHENSIVE METABOLIC PANEL WITH GFR
ALT: 21
AST: 22
Albumin: 3.8
Alkaline Phosphatase: 88
BUN: 8
CO2: 26
Calcium: 8.9
Chloride: 111
Creatinine, Ser: 0.99
GFR calc Af Amer: >60
GFR calc non Af Amer: >60
Glucose, Bld: 114 — ABNORMAL HIGH
Potassium: 4
Sodium: 142
Total Bilirubin: 1.4 — ABNORMAL HIGH
Total Protein: 6.5

## 2011-02-23 LAB — LIPASE, BLOOD: Lipase: 37

## 2011-02-23 LAB — URINALYSIS, ROUTINE W REFLEX MICROSCOPIC
Bilirubin Urine: NEGATIVE
Glucose, UA: NEGATIVE
Hgb urine dipstick: NEGATIVE
Ketones, ur: NEGATIVE
Nitrite: NEGATIVE
Protein, ur: NEGATIVE
Specific Gravity, Urine: 1.016
Urobilinogen, UA: 0.2
pH: 5.5

## 2011-02-23 LAB — CBC
HCT: 45.5
Hemoglobin: 16.1
MCHC: 35.4
MCV: 97
Platelets: 216
RBC: 4.69
RDW: 13.5
WBC: 5.7

## 2011-02-23 LAB — DIFFERENTIAL
Basophils Absolute: 0
Basophils Relative: 1
Eosinophils Absolute: 0.2
Eosinophils Relative: 3
Lymphocytes Relative: 40
Lymphs Abs: 2.3
Monocytes Absolute: 0.5
Monocytes Relative: 9
Neutro Abs: 2.7
Neutrophils Relative %: 47

## 2011-02-26 ENCOUNTER — Other Ambulatory Visit (HOSPITAL_COMMUNITY): Payer: Self-pay | Admitting: Rheumatology

## 2011-02-26 ENCOUNTER — Ambulatory Visit (HOSPITAL_COMMUNITY)
Admission: RE | Admit: 2011-02-26 | Discharge: 2011-02-26 | Disposition: A | Discharge status: Home / Self Care | Payer: 59 | Source: Ambulatory Visit | Attending: Rheumatology | Admitting: Rheumatology

## 2011-02-26 DIAGNOSIS — M542 Cervicalgia: Secondary | ICD-10-CM

## 2011-02-26 DIAGNOSIS — M5030 Other cervical disc degeneration, unspecified cervical region: Secondary | ICD-10-CM | POA: Insufficient documentation

## 2011-02-26 DIAGNOSIS — R202 Paresthesia of skin: Secondary | ICD-10-CM

## 2011-02-26 DIAGNOSIS — R209 Unspecified disturbances of skin sensation: Secondary | ICD-10-CM | POA: Insufficient documentation

## 2011-03-09 ENCOUNTER — Other Ambulatory Visit (HOSPITAL_COMMUNITY): Payer: Self-pay | Admitting: Rheumatology

## 2011-03-09 DIAGNOSIS — M542 Cervicalgia: Secondary | ICD-10-CM

## 2011-03-13 ENCOUNTER — Ambulatory Visit (HOSPITAL_COMMUNITY)
Admission: RE | Admit: 2011-03-13 | Discharge: 2011-03-13 | Disposition: A | Discharge status: Home / Self Care | Payer: 59 | Source: Ambulatory Visit | Attending: Rheumatology | Admitting: Rheumatology

## 2011-03-13 DIAGNOSIS — M542 Cervicalgia: Secondary | ICD-10-CM

## 2011-03-13 DIAGNOSIS — M5380 Other specified dorsopathies, site unspecified: Secondary | ICD-10-CM | POA: Insufficient documentation

## 2011-03-13 DIAGNOSIS — M5000 Cervical disc disorder with myelopathy, unspecified cervical region: Secondary | ICD-10-CM | POA: Insufficient documentation

## 2011-03-13 DIAGNOSIS — R209 Unspecified disturbances of skin sensation: Secondary | ICD-10-CM | POA: Insufficient documentation

## 2011-04-16 ENCOUNTER — Telehealth (INDEPENDENT_AMBULATORY_CARE_PROVIDER_SITE_OTHER): Payer: Self-pay | Admitting: *Deleted

## 2011-04-16 NOTE — Telephone Encounter (Signed)
[name] said the reason he has not been to see you, is he has a pinched nerve in his neck. [name] has been going for injections and hasn't have any problems. Would like to know if he still needs to be seen. Scheduled an apt for 04/24/11 at 2:15 pm.

## 2011-04-24 ENCOUNTER — Ambulatory Visit (INDEPENDENT_AMBULATORY_CARE_PROVIDER_SITE_OTHER): Payer: 59 | Admitting: Internal Medicine

## 2011-07-10 ENCOUNTER — Ambulatory Visit (INDEPENDENT_AMBULATORY_CARE_PROVIDER_SITE_OTHER): Payer: 59 | Admitting: Internal Medicine

## 2011-07-10 ENCOUNTER — Encounter (INDEPENDENT_AMBULATORY_CARE_PROVIDER_SITE_OTHER): Payer: Self-pay | Admitting: Internal Medicine

## 2011-07-10 DIAGNOSIS — R141 Gas pain: Secondary | ICD-10-CM

## 2011-07-10 DIAGNOSIS — R142 Eructation: Secondary | ICD-10-CM

## 2011-07-10 DIAGNOSIS — R197 Diarrhea, unspecified: Secondary | ICD-10-CM

## 2011-07-10 DIAGNOSIS — R110 Nausea: Secondary | ICD-10-CM | POA: Insufficient documentation

## 2011-07-10 DIAGNOSIS — M1990 Unspecified osteoarthritis, unspecified site: Secondary | ICD-10-CM | POA: Insufficient documentation

## 2011-07-10 DIAGNOSIS — K529 Noninfective gastroenteritis and colitis, unspecified: Secondary | ICD-10-CM

## 2011-07-10 DIAGNOSIS — M810 Age-related osteoporosis without current pathological fracture: Secondary | ICD-10-CM | POA: Insufficient documentation

## 2011-07-10 MED ORDER — DIPHENOXYLATE-ATROPINE 2.5-0.025 MG PO TABS: 2.0000 | ORAL_TABLET | Freq: Two times a day (BID) | ORAL | Status: DC

## 2011-07-10 MED ORDER — ONDANSETRON HCL 4 MG PO TABS: 4.0000 mg | ORAL_TABLET | Freq: Two times a day (BID) | ORAL | Status: AC | PRN

## 2011-07-10 NOTE — Progress Notes (Signed)
Presenting complaint; Followup for diarrhea nausea and burping. Subjective: Patient is 66 year old Caucasian male patient of Dr. [name] who is here for scheduled visit to review his GI problems. He was last seen in April 2012 and begun on Lomotil. He says his diarrhea is just about gone. However he continues to experience intractable spells of nausea with burping and inability to eat. Last week before he went on prednisone and he was also vomiting. He had Kenalog injection to his neck about 2 months ago and had no problems until last week and he believe it is secondary to Kenalog. Since he is then back on prednisone he is 75% better. He generally has 2-3 formed stools daily with occasional diarrhea. He has not experienced any accidents lately. His appetite is back to normal. His weight is close to his baseline. He is concerned about having to use prednisone for his symptoms because of osteoporosis. He remains with pain involving multiple joints. He is under care of Dr. [name] who feels he has mild rheumatoid arthritis. He is doing better with sulfasalazine. He uses Aleve no more than couple of times a month. Current Medications:  sulfasalazine 1 g by mouth twice a day. Atenolol 25 mg by mouth twice a day Amlodipine 2.5 mg by mouth daily Aspirin 81 mg by mouth daily Vitamin B6 100 mg by mouth daily Cetirizine 10 mg by mouth daily. Prednisone 2.5 mg by mouth twice a day Iomotil 2 tablets by mouth twice a day Citracal 1 tablet by mouth daily Pantoprazole 40 mg by mouth daily when necessary Witamin D3 2000 units by mouth daily     Objective: Blood pressure 128/70, pulse 76, temperature 98.7 F (37.1 C), temperature source Oral, resp. rate 14, height 5\' 9"  (1.753 m), weight 185 lb 6.4 oz (84.097 kg). Conjunctiva is pink. Sclera is nonicteric Oropharyngeal mucosa is normal. No neck masses or thyromegaly noted. Cardiac exam with regular rhythm normal S1 and S2. No murmur or gallop  noted. Lungs are clear to auscultation. Abdomen is full. Bowel sounds are hyperactive. No bruits noted. On palpation it is soft and nontender without organomegaly or masses.  No LE edema or clubbing noted.   Assessment: Patient has ill-defined GI syndrome consisting of diarrhea, nausea and intractable burping. He has undergone extensive evaluation both locally as well as in [place] an opinion from Dr. [name] of [facility]. Trial with the gluten free diet was ineffective. He has always responded to prednisone. He was documented to have elevated CRP. He there has allergic gastroenteritis or idiopathic IBD. Osteoporosis. Presently he is on calcium and vitamin D. He may benefit from additional therapy. Will get assistance from Dr.[name].  Plan: Taper prednisone over the next 2 weeks. Consider EGD with gastroduodenal biopsy as well as CBC with differential and CRP when symptoms relapse. Patient will discuss further treatment options regarding osteoporosis with Dr. [name] on his next visit. Decrease Lomotil dose to 3 tablets per day if possible. Ondansetron 4 mg by mouth twice a day when necessary

## 2011-07-10 NOTE — Patient Instructions (Addendum)
Prednisone taper over the next 2 weeks as discussed. Notify when symptoms recur in which case will schedule EGD with  gastric and duodenal biopsy. Decrease Lomotil to 2 tablets in the morning and one in the evening.

## 2011-11-21 ENCOUNTER — Other Ambulatory Visit (INDEPENDENT_AMBULATORY_CARE_PROVIDER_SITE_OTHER): Payer: Self-pay | Admitting: *Deleted

## 2011-11-21 NOTE — Telephone Encounter (Signed)
We rec'd to refill request from the [facility] for Prednisone 10 mg #100  Take one tablet once daily. Take with Food Ondansetron 4 mg Tab #30 Take one tablet every 12 hours as needed for nausea. Per Dr.Rehman both of these are approved and I called and gave a verbal okay to [facility].

## 2012-01-02 ENCOUNTER — Telehealth (INDEPENDENT_AMBULATORY_CARE_PROVIDER_SITE_OTHER): Payer: Self-pay | Admitting: *Deleted

## 2012-01-02 NOTE — Telephone Encounter (Signed)
[facility] called and states that the patient is in need of a refill on his generic lomotil , patient going out of town tomorrow, August 23,2013. Per Dr.Rehman may refill the Lomotil 2.5 mg - Patient to take 2 tablets twice a day #120 with 2 additional refills. This was called to [facility].

## 2012-08-26 ENCOUNTER — Telehealth (INDEPENDENT_AMBULATORY_CARE_PROVIDER_SITE_OTHER): Payer: Self-pay | Admitting: *Deleted

## 2012-08-26 NOTE — Telephone Encounter (Signed)
Patient had [facility] to call our office and ask that we refill his Prednisone 10 mg take 1 daily #30 Per Dr.Rehman we may fill this  Medication but the patient needs to have a office appointment. Last seen February 2013. I called [facility] and gave a verbal order per Dr.Rehman to [employer]. Marchelle Folks was made aware that the patient needed an appointment with Dr.Rehman..... Forwarded to Lupita Leash to make an appointment soon as possible

## 2012-08-27 DIAGNOSIS — Z79899 Other long term (current) drug therapy: Secondary | ICD-10-CM | POA: Diagnosis not present

## 2012-08-27 DIAGNOSIS — M130 Polyarthritis, unspecified: Secondary | ICD-10-CM | POA: Diagnosis not present

## 2012-08-27 NOTE — Telephone Encounter (Signed)
Apt has been scheduled for 09/16/12 with Dr. Karilyn Cota.

## 2012-09-01 DIAGNOSIS — M7989 Other specified soft tissue disorders: Secondary | ICD-10-CM | POA: Diagnosis not present

## 2012-09-01 DIAGNOSIS — R7401 Elevation of levels of liver transaminase levels: Secondary | ICD-10-CM | POA: Diagnosis not present

## 2012-09-01 DIAGNOSIS — M069 Rheumatoid arthritis, unspecified: Secondary | ICD-10-CM | POA: Diagnosis not present

## 2012-09-01 DIAGNOSIS — Z79899 Other long term (current) drug therapy: Secondary | ICD-10-CM | POA: Diagnosis not present

## 2012-09-16 ENCOUNTER — Encounter (INDEPENDENT_AMBULATORY_CARE_PROVIDER_SITE_OTHER): Payer: Self-pay | Admitting: Internal Medicine

## 2012-09-16 ENCOUNTER — Ambulatory Visit (INDEPENDENT_AMBULATORY_CARE_PROVIDER_SITE_OTHER): Payer: Medicare Other | Admitting: Internal Medicine

## 2012-09-16 VITALS — BP 128/78 | HR 74 | Temp 99.0°F | Resp 18 | Ht 69.0 in | Wt 193.2 lb

## 2012-09-16 DIAGNOSIS — R197 Diarrhea, unspecified: Secondary | ICD-10-CM | POA: Diagnosis not present

## 2012-09-16 DIAGNOSIS — R143 Flatulence: Secondary | ICD-10-CM | POA: Diagnosis not present

## 2012-09-16 DIAGNOSIS — K529 Noninfective gastroenteritis and colitis, unspecified: Secondary | ICD-10-CM

## 2012-09-16 DIAGNOSIS — R7989 Other specified abnormal findings of blood chemistry: Secondary | ICD-10-CM | POA: Diagnosis not present

## 2012-09-16 DIAGNOSIS — R141 Gas pain: Secondary | ICD-10-CM | POA: Diagnosis not present

## 2012-09-16 DIAGNOSIS — R945 Abnormal results of liver function studies: Secondary | ICD-10-CM

## 2012-09-16 DIAGNOSIS — R140 Abdominal distension (gaseous): Secondary | ICD-10-CM

## 2012-09-16 DIAGNOSIS — R142 Eructation: Secondary | ICD-10-CM | POA: Diagnosis not present

## 2012-09-16 MED ORDER — AZATHIOPRINE 50 MG PO TABS: 100.0000 mg | ORAL_TABLET | Freq: Every day | ORAL | Status: DC

## 2012-09-16 NOTE — Patient Instructions (Addendum)
Physician will contact her with results of blood work and CT when completed.

## 2012-09-16 NOTE — Progress Notes (Signed)
Presenting complaint;  Abdominal bloating. History of diarrhea, regurgitation and belching.  Subjective:  Patient is 67 year old Caucasian male who has history of chronic diarrhea which has been prednisone responsive. He has had elevated CRP. He has undergone multiple studies but definite etiology of his diarrhea has not been determined. He was last seen in February 2013. Since then he has been diagnosed with rheumatoid arthritis and is under care of Dr. will [name]. At one point he was in 150 mg of azathioprine per day. Dose was decreased from 100 mg of 50 mg last month because of elevated transaminases. He states while he was on higher dose of azathioprine all of his GI symptoms resolved. He had a relapse of his diarrhea with intractable bloating regurgitation and burping and was begun on prednisone on 09/02/2011 and he is then rapidly tapering it. He has 3 more days to go. He is presently on 5 mg daily. His diarrhea has resolved. Today he had one formed stool but he continues to complain of loading which he describes to be intractable. His appetite is good. He has gained 8 pounds since his last visit 14 months ago. He denies fever chills nausea vomiting or night sweats. There is no history of icteric hepatitis. There is also no history of prior transfusion or jaundice. He has not been vaccinated for hepatitis A or B. He has never received any tattoos. He does have a history of fatty liver based on prior imaging studies but he does not recall that his transaminases have ever been elevated. He has 2-3 losses of wine every evening.   Current Medications: Current Outpatient Prescriptions  Medication Sig Dispense Refill  . alendronate (FOSAMAX) 70 MG tablet Take 70 mg by mouth every 7 (seven) days. Take with a full glass of water on an empty stomach.      Marland Kitchen amLODipine (NORVASC) 2.5 MG tablet Take 2.5 mg by mouth daily.       Marland Kitchen aspirin 81 MG tablet Take 81 mg by mouth daily.      Marland Kitchen atenolol  (TENORMIN) 25 MG tablet Take 25 mg by mouth. Patient takes 1 by mouth in the morning and 1 by mouth in the evening      . azaTHIOprine (IMURAN) 50 MG tablet Take 50 mg by mouth daily.       . Calcium Citrate-Vitamin D (CITRACAL + D PO) Take by mouth. This is 400 mg of Citracal and 500 Units of Vitamin D      . carisoprodol (SOMA) 350 MG tablet Take 350 mg by mouth as needed for muscle spasms.      . diphenoxylate-atropine (LOMOTIL) 2.5-0.025 MG per tablet Take 1 tablet by mouth. Patient takes 1 by mouth in the morning and 1 by mouth in the evening      . NON FORMULARY 10 mg. Per the patient this is an over the counter medication - Ceritigine Hydrochloride      . ondansetron (ZOFRAN) 4 MG tablet Take 4 mg by mouth as needed for nausea.      . predniSONE (DELTASONE) 10 MG tablet Take 5 mg by mouth daily.       Marland Kitchen sulfaSALAzine (AZULFIDINE) 500 MG tablet Take 500 mg by mouth 2 (two) times daily.      Marland Kitchen ZETIA 10 MG tablet Take 10 mg by mouth daily.        No current facility-administered medications for this visit.     Objective: Blood pressure 128/78, pulse 74, temperature 99 F (  37.2 C), temperature source Oral, resp. rate 18, height 5\' 9"  (1.753 m), weight 193 lb 3.2 oz (87.635 kg). Patient is alert and in no acute distress. Conjunctiva is pink. Sclera is nonicteric Oropharyngeal mucosa is normal. No neck masses or thyromegaly noted. Cardiac exam with regular rhythm normal S1 and S2. No murmur or gallop noted. Lungs are clear to auscultation. Abdomen is protuberant but protection note is not very tympanitic. Bowel sounds are normal. No bruits noted. On palpation soft abdomen without tenderness splenomegaly or masses. No LE edema or clubbing noted.  Labs/studies Results:  Lab data from 08/27/2012. WBC 4.7, H&H 16.3 and 46.1. MCV is 108.7 and platelet count 225K. Bilirubin 1.4, AP 52, AST 40, ALT 65, total protein 6.5 and albumin 4.1. Calcium 9.2. Electrolytes within normal limits. Glucose  83, BUN 11 and creatinine 0.88  Assessment:  #1. Recurrent diarrhea so severe with bloating burping and regurgitation. His symptom complex has always responded to prednisone. His CRP has been elevated in the past. His symptom complex has been suggestive of IBD but none found on endoscopic evaluation as well as multiple imaging modalities. He was seen by Dr. [name] of [facility] but he was not interested in small bowel endoscopy. Given capsule could not be authorized by his insurance. When he was on higher dose of azathioprine his GI symptoms resolved suggesting he may have idiopathic IBD. He needs to be on higher dose of azathioprine.  #2. Mildly elevated transaminases. ALT is greater than AST. I doubt that this is secondary to ethanol use. I agree with Dr. [name] that he needs to cut back on alcohol intake and should not consume more than 2 drinks per day. #3. Bloating. His abdominal exam does not suggest excessive flatulence. I doubt that he has ascites. This would be easily ruled out with ultrasound.    Plan: Patient advised to keep alcohol intake to no more than 2 drinks per day. Hepatobiliary ultrasound. He will go to the lab next week for LFTs, HbsAg and HCV antibody.  increase azathioprine 200 mg by mouth daily. I will contact patient with results of the studies and plan to see him back in 3 months.

## 2012-09-17 ENCOUNTER — Other Ambulatory Visit (INDEPENDENT_AMBULATORY_CARE_PROVIDER_SITE_OTHER): Payer: Self-pay | Admitting: Internal Medicine

## 2012-09-17 DIAGNOSIS — R110 Nausea: Secondary | ICD-10-CM

## 2012-09-17 DIAGNOSIS — M069 Rheumatoid arthritis, unspecified: Secondary | ICD-10-CM

## 2012-09-17 DIAGNOSIS — R140 Abdominal distension (gaseous): Secondary | ICD-10-CM

## 2012-09-17 DIAGNOSIS — R197 Diarrhea, unspecified: Secondary | ICD-10-CM

## 2012-09-17 DIAGNOSIS — K5229 Other allergic and dietetic gastroenteritis and colitis: Secondary | ICD-10-CM

## 2012-09-17 DIAGNOSIS — M810 Age-related osteoporosis without current pathological fracture: Secondary | ICD-10-CM

## 2012-09-17 DIAGNOSIS — R142 Eructation: Secondary | ICD-10-CM

## 2012-09-22 ENCOUNTER — Ambulatory Visit (HOSPITAL_COMMUNITY)
Admission: RE | Admit: 2012-09-22 | Discharge: 2012-09-22 | Disposition: A | Discharge status: Home / Self Care | Payer: Medicare Other | Source: Ambulatory Visit | Attending: Internal Medicine | Admitting: Internal Medicine

## 2012-09-22 DIAGNOSIS — M069 Rheumatoid arthritis, unspecified: Secondary | ICD-10-CM

## 2012-09-22 DIAGNOSIS — R197 Diarrhea, unspecified: Secondary | ICD-10-CM | POA: Diagnosis not present

## 2012-09-22 DIAGNOSIS — R7989 Other specified abnormal findings of blood chemistry: Secondary | ICD-10-CM | POA: Diagnosis not present

## 2012-09-22 DIAGNOSIS — K7689 Other specified diseases of liver: Secondary | ICD-10-CM | POA: Diagnosis not present

## 2012-09-22 DIAGNOSIS — R110 Nausea: Secondary | ICD-10-CM | POA: Diagnosis not present

## 2012-09-22 DIAGNOSIS — R141 Gas pain: Secondary | ICD-10-CM | POA: Diagnosis not present

## 2012-09-22 DIAGNOSIS — R140 Abdominal distension (gaseous): Secondary | ICD-10-CM

## 2012-09-22 DIAGNOSIS — R142 Eructation: Secondary | ICD-10-CM | POA: Insufficient documentation

## 2012-09-22 DIAGNOSIS — M810 Age-related osteoporosis without current pathological fracture: Secondary | ICD-10-CM

## 2012-09-22 DIAGNOSIS — K5229 Other allergic and dietetic gastroenteritis and colitis: Secondary | ICD-10-CM

## 2012-09-22 LAB — HEPATITIS B SURFACE ANTIGEN: Hepatitis B Surface Ag: NEGATIVE

## 2012-09-22 LAB — HEPATIC FUNCTION PANEL
ALT: 35 U/L (ref 0–53)
AST: 30 U/L (ref 0–37)
Albumin: 3.9 g/dL (ref 3.5–5.2)
Alkaline Phosphatase: 48 U/L (ref 39–117)
Bilirubin, Direct: 0.1 mg/dL (ref 0.0–0.3)
Indirect Bilirubin: 0.9 mg/dL (ref 0.0–0.9)
Total Bilirubin: 1 mg/dL (ref 0.3–1.2)
Total Protein: 5.9 g/dL — ABNORMAL LOW (ref 6.0–8.3)

## 2012-09-22 LAB — HEPATITIS C ANTIBODY: HCV Ab: NEGATIVE

## 2012-09-22 MED ORDER — IOHEXOL 300 MG/ML  SOLN
100.0000 mL | Freq: Once | INTRAMUSCULAR | Status: AC | PRN
  Administered 2012-09-22: 100 mL via INTRAVENOUS

## 2012-09-25 ENCOUNTER — Telehealth: Payer: Self-pay | Admitting: Cardiovascular Disease

## 2012-09-25 NOTE — Telephone Encounter (Signed)
Please call -cant afford his Zieta!

## 2012-10-01 ENCOUNTER — Telehealth (INDEPENDENT_AMBULATORY_CARE_PROVIDER_SITE_OTHER): Payer: Self-pay | Admitting: *Deleted

## 2012-10-01 DIAGNOSIS — R945 Abnormal results of liver function studies: Secondary | ICD-10-CM

## 2012-10-01 NOTE — Telephone Encounter (Signed)
Labs per Dr.Rehman. 

## 2012-10-02 ENCOUNTER — Encounter (INDEPENDENT_AMBULATORY_CARE_PROVIDER_SITE_OTHER): Payer: Self-pay

## 2012-10-08 ENCOUNTER — Other Ambulatory Visit (INDEPENDENT_AMBULATORY_CARE_PROVIDER_SITE_OTHER): Payer: Self-pay | Admitting: Internal Medicine

## 2012-10-27 DIAGNOSIS — H43399 Other vitreous opacities, unspecified eye: Secondary | ICD-10-CM | POA: Diagnosis not present

## 2012-10-27 DIAGNOSIS — H35369 Drusen (degenerative) of macula, unspecified eye: Secondary | ICD-10-CM | POA: Diagnosis not present

## 2012-10-27 DIAGNOSIS — H5210 Myopia, unspecified eye: Secondary | ICD-10-CM | POA: Diagnosis not present

## 2012-10-27 DIAGNOSIS — H43819 Vitreous degeneration, unspecified eye: Secondary | ICD-10-CM | POA: Diagnosis not present

## 2012-10-28 DIAGNOSIS — Z79899 Other long term (current) drug therapy: Secondary | ICD-10-CM | POA: Diagnosis not present

## 2012-10-28 DIAGNOSIS — M130 Polyarthritis, unspecified: Secondary | ICD-10-CM | POA: Diagnosis not present

## 2012-10-29 ENCOUNTER — Other Ambulatory Visit (INDEPENDENT_AMBULATORY_CARE_PROVIDER_SITE_OTHER): Payer: Self-pay | Admitting: Internal Medicine

## 2012-11-05 ENCOUNTER — Other Ambulatory Visit (INDEPENDENT_AMBULATORY_CARE_PROVIDER_SITE_OTHER): Payer: Self-pay | Admitting: *Deleted

## 2012-11-05 ENCOUNTER — Encounter (INDEPENDENT_AMBULATORY_CARE_PROVIDER_SITE_OTHER): Payer: Self-pay | Admitting: *Deleted

## 2012-11-05 DIAGNOSIS — R7989 Other specified abnormal findings of blood chemistry: Secondary | ICD-10-CM | POA: Diagnosis not present

## 2012-11-05 DIAGNOSIS — R945 Abnormal results of liver function studies: Secondary | ICD-10-CM

## 2012-11-13 DIAGNOSIS — R7989 Other specified abnormal findings of blood chemistry: Secondary | ICD-10-CM | POA: Diagnosis not present

## 2012-11-13 LAB — CBC WITH DIFFERENTIAL/PLATELET
Basophils Absolute: 0 10*3/uL (ref 0.0–0.1)
Basophils Relative: 0 % (ref 0–1)
Eosinophils Absolute: 0 10*3/uL (ref 0.0–0.7)
Eosinophils Relative: 0 % (ref 0–5)
HCT: 42.7 % (ref 39.0–52.0)
Hemoglobin: 15.3 g/dL (ref 13.0–17.0)
Lymphocytes Relative: 28 % (ref 12–46)
Lymphs Abs: 1.5 10*3/uL (ref 0.7–4.0)
MCH: 38.3 pg — ABNORMAL HIGH (ref 26.0–34.0)
MCHC: 35.8 g/dL (ref 30.0–36.0)
MCV: 107 fL — ABNORMAL HIGH (ref 78.0–100.0)
Monocytes Absolute: 0.6 10*3/uL (ref 0.1–1.0)
Monocytes Relative: 12 % (ref 3–12)
Neutro Abs: 3.2 10*3/uL (ref 1.7–7.7)
Neutrophils Relative %: 60 % (ref 43–77)
Platelets: 211 10*3/uL (ref 150–400)
RBC: 3.99 MIL/uL — ABNORMAL LOW (ref 4.22–5.81)
RDW: 14.6 % (ref 11.5–15.5)
WBC: 5.3 10*3/uL (ref 4.0–10.5)

## 2012-11-18 DIAGNOSIS — M069 Rheumatoid arthritis, unspecified: Secondary | ICD-10-CM | POA: Diagnosis not present

## 2012-11-18 DIAGNOSIS — R6889 Other general symptoms and signs: Secondary | ICD-10-CM | POA: Diagnosis not present

## 2012-11-18 DIAGNOSIS — Z79899 Other long term (current) drug therapy: Secondary | ICD-10-CM | POA: Diagnosis not present

## 2012-11-20 ENCOUNTER — Telehealth (INDEPENDENT_AMBULATORY_CARE_PROVIDER_SITE_OTHER): Payer: Self-pay | Admitting: *Deleted

## 2012-11-20 DIAGNOSIS — R945 Abnormal results of liver function studies: Secondary | ICD-10-CM

## 2012-11-20 NOTE — Telephone Encounter (Signed)
Per Dr.Rehman the patient will need to have labs drawn in 3 months 

## 2012-11-27 ENCOUNTER — Other Ambulatory Visit: Payer: Self-pay | Admitting: Cardiovascular Disease

## 2012-11-27 ENCOUNTER — Other Ambulatory Visit (INDEPENDENT_AMBULATORY_CARE_PROVIDER_SITE_OTHER): Payer: Self-pay | Admitting: Internal Medicine

## 2012-12-08 DIAGNOSIS — M549 Dorsalgia, unspecified: Secondary | ICD-10-CM | POA: Diagnosis not present

## 2012-12-16 ENCOUNTER — Encounter (INDEPENDENT_AMBULATORY_CARE_PROVIDER_SITE_OTHER): Payer: Self-pay | Admitting: Internal Medicine

## 2012-12-16 ENCOUNTER — Ambulatory Visit (INDEPENDENT_AMBULATORY_CARE_PROVIDER_SITE_OTHER): Payer: Medicare Other | Admitting: Internal Medicine

## 2012-12-16 VITALS — BP 118/76 | HR 74 | Temp 98.3°F | Resp 18 | Ht 69.0 in | Wt 188.0 lb

## 2012-12-16 DIAGNOSIS — R197 Diarrhea, unspecified: Secondary | ICD-10-CM | POA: Diagnosis not present

## 2012-12-16 DIAGNOSIS — K529 Noninfective gastroenteritis and colitis, unspecified: Secondary | ICD-10-CM

## 2012-12-16 MED ORDER — DIPHENOXYLATE-ATROPINE 2.5-0.025 MG PO TABS: 1.0000 | ORAL_TABLET | Freq: Every day | ORAL | Status: DC

## 2012-12-16 NOTE — Patient Instructions (Signed)
Consultation with GI physician at [facility] to be arranged

## 2012-12-16 NOTE — Progress Notes (Signed)
Presenting complaint;  Followup for chronic diarrhea.  Subjective:  Patient is 67 year old Caucasian male who presents for scheduled visit. He was last seen 3 months ago. He has steroid responsive chronic diarrhea. Endoscopic evaluation as well as small bowel follow-through were negative. He was evaluated by Dr. [name] of [facility] at my request but he did not feel that small bowel endoscopy was indicated. Patient was also diagnosed with rheumatoid arthritis by Dr. [name] and had been on Imuran. On his last visit I advised him to stay on this medication as it might help with this diarrhea. He states he did well for several weeks. As he tried to decrease prednisone from 10 mg to 5 mg daily his diarrhea relapse any at the co-op in the dose. 2 weeks ago he fell coming down the stairs and developed back pain. He was put back on Medrol Dosepak. As soon as he went on Medrol Dosepak he noted resolution of his diarrhea and bloating. Since Imuran is not helping Dr. Sharyn Lull felt it could be discontinued. Patient denies fever chills melena or rectal bleeding. His weight is down by 5 pounds in the last 3 months.  Current Medications: Current Outpatient Prescriptions  Medication Sig Dispense Refill  . alendronate (FOSAMAX) 70 MG tablet Take 70 mg by mouth every 7 (seven) days. Take with a full glass of water on an empty stomach.      Marland Kitchen amLODipine (NORVASC) 2.5 MG tablet TAKE ONE (1) TABLET EACH DAY  30 tablet  6  . aspirin 81 MG tablet Take 81 mg by mouth. The patient states that he takes every other day      . atenolol (TENORMIN) 25 MG tablet Take 25 mg by mouth. Patient takes 1 by mouth in the morning and 1 by mouth in the evening      . azaTHIOprine (IMURAN) 50 MG tablet Take 2 tablets (100 mg total) by mouth daily.  60 tablet  5  . Calcium Citrate-Vitamin D (CITRACAL + D PO) Take by mouth. This is 400 mg of Citracal and 500 Units of Vitamin D      . carisoprodol (SOMA) 350 MG tablet Take 350 mg  by mouth daily as needed for muscle spasms.       . diphenoxylate-atropine (LOMOTIL) 2.5-0.025 MG per tablet Take 1 tablet by mouth. Patient takes 2 by mouth in the morning .      Marland Kitchen NON FORMULARY 10 mg. Per the patient this is an over the counter medication - Ceritigine Hydrochloride      . ondansetron (ZOFRAN) 4 MG tablet TAKE ONE TABLET EVERY 12 HOURS AS NEEDEDFOR NAUSEA  30 tablet  1  . predniSONE (DELTASONE) 10 MG tablet TAKE ONE TABLET ONCE DAILY. TAKE WITH FOOD  100 tablet  0  . sulfaSALAzine (AZULFIDINE) 500 MG tablet Take 500 mg by mouth 2 (two) times daily.      Marland Kitchen ZETIA 10 MG tablet Take 10 mg by mouth daily.        No current facility-administered medications for this visit.     Objective: Blood pressure 118/76, pulse 74, temperature 98.3 F (36.8 C), temperature source Oral, resp. rate 18, height 5\' 9"  (1.753 m), weight 188 lb (85.276 kg). Patient is alert and in no acute distress. Conjunctiva is pink. Sclera is nonicteric Oropharyngeal mucosa is normal. No neck masses or thyromegaly noted. Cardiac exam with regular rhythm normal S1 and S2. No murmur or gallop noted. Lungs are clear to auscultation. Abdomen. Bowel  sounds are normal. Palpation reveals soft abdomen without tenderness and no megaly or masses.  No LE edema or clubbing noted.   Assessment:  #1. Chronic diarrhea. He has been on Imuran for over 6 months but he could not be weaned off prednisone. Therefore I do not see any reason for him to stay on Imuran.  Response to prednisone would suggest inflammatory bowel disease but we have not been able to document this with endoscopic or other means. Only biochemical abnormality that was documented was elevated CRP.    Plan: Will discuss patient's condition with Dr. [name] later this Discontinue Imuran. New prescription given for diphenoxylate. CRP. Consultation with GI physician at [facility].

## 2012-12-17 DIAGNOSIS — R197 Diarrhea, unspecified: Secondary | ICD-10-CM | POA: Diagnosis not present

## 2012-12-18 LAB — C-REACTIVE PROTEIN: CRP: <0.5 mg/dL

## 2012-12-30 ENCOUNTER — Encounter (INDEPENDENT_AMBULATORY_CARE_PROVIDER_SITE_OTHER): Payer: Self-pay

## 2013-01-21 DIAGNOSIS — Q828 Other specified congenital malformations of skin: Secondary | ICD-10-CM | POA: Diagnosis not present

## 2013-01-28 ENCOUNTER — Other Ambulatory Visit: Payer: Self-pay | Admitting: Cardiovascular Disease

## 2013-01-30 ENCOUNTER — Other Ambulatory Visit (INDEPENDENT_AMBULATORY_CARE_PROVIDER_SITE_OTHER): Payer: Self-pay | Admitting: Internal Medicine

## 2013-01-30 DIAGNOSIS — R197 Diarrhea, unspecified: Secondary | ICD-10-CM

## 2013-01-30 NOTE — Telephone Encounter (Signed)
Prednisone 10 mg daily 

## 2013-01-30 NOTE — Telephone Encounter (Signed)
Rx was sent to pharmacy electronically. 

## 2013-02-11 ENCOUNTER — Encounter (INDEPENDENT_AMBULATORY_CARE_PROVIDER_SITE_OTHER): Payer: Self-pay | Admitting: *Deleted

## 2013-02-11 ENCOUNTER — Other Ambulatory Visit (INDEPENDENT_AMBULATORY_CARE_PROVIDER_SITE_OTHER): Payer: Self-pay | Admitting: *Deleted

## 2013-02-11 DIAGNOSIS — R945 Abnormal results of liver function studies: Secondary | ICD-10-CM

## 2013-02-16 DIAGNOSIS — R21 Rash and other nonspecific skin eruption: Secondary | ICD-10-CM | POA: Diagnosis not present

## 2013-02-18 DIAGNOSIS — R21 Rash and other nonspecific skin eruption: Secondary | ICD-10-CM | POA: Diagnosis not present

## 2013-02-18 DIAGNOSIS — J309 Allergic rhinitis, unspecified: Secondary | ICD-10-CM | POA: Diagnosis not present

## 2013-04-13 DIAGNOSIS — M545 Low back pain, unspecified: Secondary | ICD-10-CM | POA: Diagnosis not present

## 2013-04-13 DIAGNOSIS — IMO0002 Reserved for concepts with insufficient information to code with codable children: Secondary | ICD-10-CM | POA: Diagnosis not present

## 2013-04-14 ENCOUNTER — Ambulatory Visit (INDEPENDENT_AMBULATORY_CARE_PROVIDER_SITE_OTHER): Payer: Medicare Other | Admitting: Cardiovascular Disease

## 2013-04-14 ENCOUNTER — Encounter: Payer: Self-pay | Admitting: Cardiovascular Disease

## 2013-04-14 VITALS — BP 120/78 | HR 57 | Ht 69.0 in | Wt 193.0 lb

## 2013-04-14 DIAGNOSIS — E785 Hyperlipidemia, unspecified: Secondary | ICD-10-CM | POA: Diagnosis not present

## 2013-04-14 DIAGNOSIS — I422 Other hypertrophic cardiomyopathy: Secondary | ICD-10-CM

## 2013-04-14 DIAGNOSIS — I10 Essential (primary) hypertension: Secondary | ICD-10-CM | POA: Diagnosis not present

## 2013-04-14 NOTE — Patient Instructions (Signed)
Your physician recommends that you schedule a follow-up appointment in: 1 year  

## 2013-04-19 ENCOUNTER — Encounter: Payer: Self-pay | Admitting: Cardiovascular Disease

## 2013-04-19 DIAGNOSIS — I10 Essential (primary) hypertension: Secondary | ICD-10-CM | POA: Insufficient documentation

## 2013-04-19 DIAGNOSIS — I422 Other hypertrophic cardiomyopathy: Secondary | ICD-10-CM | POA: Insufficient documentation

## 2013-04-19 DIAGNOSIS — E785 Hyperlipidemia, unspecified: Secondary | ICD-10-CM | POA: Insufficient documentation

## 2013-04-19 NOTE — Progress Notes (Signed)
[name], male   DOB: 1945-12-28, 67 y.o.   MRN: [id number]     HPI: [name] is a 67 y.o. male  presents for one-year cardiology evaluation.  [name] has remote history of viral myocarditis in 1989 ejection fraction was 25%. He subsequently normalized LV function. He has a history of mild mitral valve prolapse. Cardiac catheterization in 2001 showed mild mid systolic LAD bridging. He has been noted to have mild T-wave changes on his left cardiogram. Additional problems include hypertension as well as hyperlipidemia. He was unable to tolerate Lipitor. He initially tolerate Crestor but then developed significant myalgias. Is also a history of possible polymyalgia rheumatica for which he is seeing Dr. [name]. He sees Dr. Karilyn Cota for bowel issues and has been on prednisone and sulfasalazine therapy. Over the past year, he has continued to do well. He denies any episodes of palpitations. He denies any shortness of breath. He continues to work. He now sees Dr. Dwana Melena for his primary M.D.  History reviewed. No pertinent past medical history.  Past Surgical History  Procedure Laterality Date  . Cholecystectomy  2004  . Shoulder open rotator cuff repair Bilateral 2001  . Apendectomy  1965  . Bone spur Bilateral 1999    Allergies  Allergen Reactions  . Lidocaine Hcl Anaphylaxis  . Crestor [Rosuvastatin Calcium]     Body Aches    Current Outpatient Prescriptions  Medication Sig Dispense Refill  . alendronate (FOSAMAX) 70 MG tablet Take 70 mg by mouth every 7 (seven) days. Take with a full glass of water on an empty stomach.      Marland Kitchen amLODipine (NORVASC) 2.5 MG tablet TAKE ONE (1) TABLET EACH DAY  30 tablet  6  . aspirin 81 MG tablet Take 81 mg by mouth. The patient states that he takes every other day      . atenolol (TENORMIN) 25 MG tablet Take 25 mg by mouth. Patient takes 1 by mouth in the morning and 1 by mouth in the evening      . Calcium Citrate-Vitamin  D (CITRACAL + D PO) Take by mouth. This is 400 mg of Citracal and 500 Units of Vitamin D      . carisoprodol (SOMA) 350 MG tablet Take 350 mg by mouth daily as needed for muscle spasms.       . diphenoxylate-atropine (LOMOTIL) 2.5-0.025 MG per tablet Patient takes 2 by mouth in the morning .      Marland Kitchen NON FORMULARY 10 mg. Per the patient this is an over the counter medication - Ceritigine Hydrochloride      . ondansetron (ZOFRAN) 4 MG tablet TAKE ONE TABLET EVERY 12 HOURS AS NEEDEDFOR NAUSEA  30 tablet  1  . predniSONE (DELTASONE) 10 MG tablet TAKE ONE TABLET ONCE DAILY. TAKE WITH FOOD  30 tablet  5  . sulfaSALAzine (AZULFIDINE) 500 MG tablet Take 500 mg by mouth 2 (two) times daily.      Marland Kitchen ZETIA 10 MG tablet TAKE ONE (1) TABLET EACH DAY  30 tablet  3   No current facility-administered medications for this visit.    History   Social History  . Marital Status: Married    Spouse Name: N/A    Number of Children: N/A  . Years of Education: N/A   Occupational History  . Not on file.   Social History Main Topics  . Smoking status: Former Smoker    Types: Cigarettes    Quit  date: 07/10/1971  . Smokeless tobacco: Never Used     Comment: Patient smoked 1/2 pack per week  . Alcohol Use: Yes     Comment: 1-2 glasses of wine with evening meal  . Drug Use: No  . Sexual Activity: Not on file   Other Topics Concern  . Not on file   Social History Narrative  . 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 He is married has 2 children. There is no tobacco use. He stays active. There is no alcohol use.  History reviewed. No pertinent family history.  ROS is negative for fevers, chills or night sweats. He denies skin rash. He wears glasses. Denies visual changes. He denies change in hearing. There is no lymphadenopathy. He denies wheezing or shortness of breath. He denies palpitations. There is no chest pain. He denies nausea or vomiting. His stomach issues have resolved with chronic  prednisone as well as sulfasalazine. He was never diagnosed with ulcerative colitis or Crohn's disease. He denies myalgias. He denies edema. He denies paresthesias. There is no diabetes. There is no cold or heat intolerance.  Other comprehensive 14 point system review is negative.  PE BP 120/78  Pulse 57  Ht 5\' 9"  (1.753 m)  Wt 87.544 kg (193 lb)  BMI 28.49 kg/m2  General: Alert, oriented, no distress.  Skin: normal turgor, no rashes HEENT: Normocephalic, atraumatic. Pupils round and reactive; sclera anicteric;no lid lag.  Nose without nasal septal hypertrophy Mouth/Parynx benign; Mallinpatti scale 2 Neck: No JVD, no carotid briuts Lungs: clear to ausculatation and percussion; no wheezing or rales Heart: RRR, s1 s2 normal 1/6 systolic murmur. Abdomen: soft, nontender; no hepatosplenomehaly, BS+; abdominal aorta nontender and not dilated by palpation. Pulses 2+ Extremities: no clubbing cyanosis or edema, Homan's sign negative  Neurologic: grossly nonfocal; cranial nerves intact Psychologic: normal affect and mood.  ECG: Sinus rhythm at 57 beats per minute. No ectopy. Normal intervals.  LABS:  BMET    Component Value Date/Time   NA 142 01/22/2007 0720   K 4.0 01/22/2007 0720   CL 111 01/22/2007 0720   CO2 26 01/22/2007 0720   GLUCOSE 114* 01/22/2007 0720   BUN 8 01/22/2007 0720   CREATININE 0.99 01/22/2007 0720   CALCIUM 8.9 01/22/2007 0720   GFRNONAA >60 01/22/2007 0720   GFRAA  Value: >60        The eGFR has been calculated using the MDRD equation. This calculation has not been validated in all clinical 01/22/2007 0720     Hepatic Function Panel     Component Value Date/Time   PROT 5.9* 09/16/2012 1621   ALBUMIN 3.9 09/16/2012 1621   AST 30 09/16/2012 1621   ALT 35 09/16/2012 1621   ALKPHOS 48 09/16/2012 1621   BILITOT 1.0 09/16/2012 1621   BILIDIR 0.1 09/16/2012 1621   IBILI 0.9 09/16/2012 1621     CBC    Component Value Date/Time   WBC 5.3 11/13/2012 0940   RBC 3.99* 11/13/2012 0940     HGB 15.3 11/13/2012 0940   HCT 42.7 11/13/2012 0940   PLT 211 11/13/2012 0940   MCV 107.0* 11/13/2012 0940   MCH 38.3* 11/13/2012 0940   MCHC 35.8 11/13/2012 0940   RDW 14.6 11/13/2012 0940   LYMPHSABS 1.5 11/13/2012 0940   MONOABS 0.6 11/13/2012 0940   EOSABS 0.0 11/13/2012 0940   BASOSABS 0.0 11/13/2012 0940     BNP No results found for this basename: probnp    Lipid  Panel  No results found for this basename: chol, trig, hdl, cholhdl, vldl, ldlcalc     RADIOLOGY: No results found.    ASSESSMENT AND PLAN:  [name] continues to do well. He is now 25 years after he developed a viral myocarditis associated with an ejection fraction of 25%. His last echo Doppler study in October 2011 showed an ejection fraction greater than 55%. He did have mild TR. Clinically he remained stable. He is not having palpitations. His GI status is stable on current therapy. He was unable to tolerate statin therapy but tolerates Zetia for hyperlipidemia.Marland Kitchen His blood pressure is controlled. I will see him in one year for followup evaluation.    Lennette Bihari, MD, [facility]  04/19/2013 8:32 PM

## 2013-04-20 ENCOUNTER — Other Ambulatory Visit: Payer: Self-pay | Admitting: Rheumatology

## 2013-04-20 ENCOUNTER — Other Ambulatory Visit (HOSPITAL_COMMUNITY): Payer: Self-pay | Admitting: Rheumatology

## 2013-04-20 DIAGNOSIS — M545 Low back pain, unspecified: Secondary | ICD-10-CM

## 2013-04-21 ENCOUNTER — Ambulatory Visit (HOSPITAL_COMMUNITY)
Admission: RE | Admit: 2013-04-21 | Discharge: 2013-04-21 | Disposition: A | Discharge status: Home / Self Care | Payer: Medicare Other | Source: Ambulatory Visit | Attending: Rheumatology | Admitting: Rheumatology

## 2013-04-21 ENCOUNTER — Encounter (HOSPITAL_COMMUNITY): Payer: Self-pay

## 2013-04-21 DIAGNOSIS — M25559 Pain in unspecified hip: Secondary | ICD-10-CM | POA: Diagnosis not present

## 2013-04-21 DIAGNOSIS — M47817 Spondylosis without myelopathy or radiculopathy, lumbosacral region: Secondary | ICD-10-CM | POA: Insufficient documentation

## 2013-04-21 DIAGNOSIS — M899 Disorder of bone, unspecified: Secondary | ICD-10-CM | POA: Diagnosis not present

## 2013-04-21 DIAGNOSIS — M5126 Other intervertebral disc displacement, lumbar region: Secondary | ICD-10-CM | POA: Insufficient documentation

## 2013-04-21 DIAGNOSIS — M48061 Spinal stenosis, lumbar region without neurogenic claudication: Secondary | ICD-10-CM | POA: Diagnosis not present

## 2013-04-21 DIAGNOSIS — M545 Low back pain, unspecified: Secondary | ICD-10-CM | POA: Insufficient documentation

## 2013-04-22 ENCOUNTER — Other Ambulatory Visit: Payer: Medicare Other

## 2013-04-24 DIAGNOSIS — M5126 Other intervertebral disc displacement, lumbar region: Secondary | ICD-10-CM | POA: Diagnosis not present

## 2013-04-24 DIAGNOSIS — E669 Obesity, unspecified: Secondary | ICD-10-CM | POA: Diagnosis not present

## 2013-04-27 ENCOUNTER — Other Ambulatory Visit: Payer: Self-pay | Admitting: Neurological Surgery

## 2013-04-27 ENCOUNTER — Encounter (HOSPITAL_COMMUNITY): Payer: Self-pay | Admitting: Respiratory Therapy

## 2013-04-28 ENCOUNTER — Encounter: Payer: Self-pay | Admitting: Cardiovascular Disease

## 2013-04-30 ENCOUNTER — Encounter (HOSPITAL_COMMUNITY)
Admission: RE | Admit: 2013-04-30 | Discharge: 2013-04-30 | Disposition: A | Discharge status: Home / Self Care | Payer: Medicare Other | Source: Ambulatory Visit | Attending: Neurological Surgery | Admitting: Neurological Surgery

## 2013-04-30 ENCOUNTER — Other Ambulatory Visit (HOSPITAL_COMMUNITY): Payer: Self-pay | Admitting: *Deleted

## 2013-04-30 ENCOUNTER — Encounter (HOSPITAL_COMMUNITY): Payer: Self-pay

## 2013-04-30 DIAGNOSIS — Z01818 Encounter for other preprocedural examination: Secondary | ICD-10-CM | POA: Diagnosis not present

## 2013-04-30 DIAGNOSIS — Z01812 Encounter for preprocedural laboratory examination: Secondary | ICD-10-CM | POA: Insufficient documentation

## 2013-04-30 HISTORY — DX: Gastro-esophageal reflux disease without esophagitis: K21.9

## 2013-04-30 HISTORY — DX: Hyperlipidemia, unspecified: E78.5

## 2013-04-30 HISTORY — DX: Disease of digestive system, unspecified: K92.9

## 2013-04-30 HISTORY — DX: Personal history of other diseases of the circulatory system: Z86.79

## 2013-04-30 HISTORY — DX: Essential (primary) hypertension: I10

## 2013-04-30 HISTORY — DX: Unspecified osteoarthritis, unspecified site: M19.90

## 2013-04-30 HISTORY — DX: Polymyalgia rheumatica: M35.3

## 2013-04-30 LAB — BASIC METABOLIC PANEL
BUN: 18 mg/dL (ref 6–23)
CO2: 29 mEq/L (ref 19–32)
Calcium: 8.6 mg/dL (ref 8.4–10.5)
Chloride: 100 mEq/L (ref 96–112)
Creatinine, Ser: 0.97 mg/dL (ref 0.50–1.35)
GFR calc Af Amer: >90 mL/min (ref >90)
GFR calc non Af Amer: 85 mL/min — ABNORMAL LOW (ref >90)
Glucose, Bld: 100 mg/dL — ABNORMAL HIGH (ref 70–99)
Potassium: 3.9 mEq/L (ref 3.5–5.1)
Sodium: 136 mEq/L (ref 135–145)

## 2013-04-30 LAB — CBC
HCT: 48.8 % (ref 39.0–52.0)
Hemoglobin: 17.7 g/dL — ABNORMAL HIGH (ref 13.0–17.0)
MCH: 38.6 pg — ABNORMAL HIGH (ref 26.0–34.0)
MCHC: 36.3 g/dL — ABNORMAL HIGH (ref 30.0–36.0)
MCV: 106.3 fL — ABNORMAL HIGH (ref 78.0–100.0)
Platelets: 213 10*3/uL (ref 150–400)
RBC: 4.59 MIL/uL (ref 4.22–5.81)
RDW: 12.3 % (ref 11.5–15.5)
WBC: 6.3 10*3/uL (ref 4.0–10.5)

## 2013-04-30 LAB — SURGICAL PCR SCREEN
MRSA, PCR: NEGATIVE
Staphylococcus aureus: NEGATIVE

## 2013-04-30 NOTE — Progress Notes (Signed)
04/30/13 1259  OBSTRUCTIVE SLEEP APNEA  Have you ever been diagnosed with sleep apnea through a sleep study? No  Do you snore loudly (loud enough to be heard through closed doors)?  1  Do you often feel tired, fatigued, or sleepy during the daytime? 0  Has anyone observed you stop breathing during your sleep? 0  Do you have, or are you being treated for high blood pressure? 0  BMI more than 35 kg/m2? 0  Age over 65 years old? 1  Neck circumference greater than 40 cm/18 inches? 1 (19)  Gender: 1  Obstructive Sleep Apnea Score 4  Score 4 or greater  Results sent to PCP

## 2013-04-30 NOTE — Pre-Procedure Instructions (Signed)
Yosiah Jasmin Lollis  04/30/2013   Your procedure is scheduled on:  Tuesday, May 05, 2013 at 7:30 AM.   Report to [facility] Entrance "A" Admitting Office at 5:30 AM.   Call this number if you have problems the morning of surgery: [phone]   Remember:   Do not eat food or drink liquids after midnight Monday, 05/04/13.   Take these medicines the morning of surgery with A SIP OF WATER: amLODipine (NORVASC), atenolol (TENORMIN),diphenoxylate-atropine (LOMOTIL),  carisoprodol (SOMA) - if needed, ondansetron [facility]) - if needed.    Do not wear jewelry.  Do not wear lotions, powders, or cologne. You may wear deodorant.  Men may shave face and neck.  Do not bring valuables to the hospital.  [facility] is not responsible                  for any belongings or valuables.               Contacts, dentures or bridgework may not be worn into surgery.  Leave suitcase in the car. After surgery it may be brought to your room.  For patients admitted to the hospital, discharge time is determined by your                treatment team.                Special Instructions: Shower using CHG 2 nights before surgery and the night before surgery.  If you shower the day of surgery use CHG.  Use special wash - you have one bottle of CHG for all showers.  You should use approximately 1/3 of the bottle for each shower.   Please read over the following fact sheets that you were given: Pain Booklet, Coughing and Deep Breathing, MRSA Information and Surgical Site Infection Prevention

## 2013-05-04 ENCOUNTER — Encounter (HOSPITAL_COMMUNITY): Payer: Self-pay | Admitting: Certified Registered Nurse Anesthetist

## 2013-05-04 ENCOUNTER — Encounter (INDEPENDENT_AMBULATORY_CARE_PROVIDER_SITE_OTHER): Payer: Self-pay | Admitting: *Deleted

## 2013-05-04 MED ORDER — CEFAZOLIN SODIUM-DEXTROSE 2-3 GM-% IV SOLR
2.0000 g | INTRAVENOUS | Status: AC
  Administered 2013-05-05: 2 g via INTRAVENOUS
  Filled 2013-05-04: qty 50

## 2013-05-05 ENCOUNTER — Ambulatory Visit (HOSPITAL_COMMUNITY)
Admission: RE | Admit: 2013-05-05 | Discharge: 2013-05-05 | Disposition: A | Discharge status: Home / Self Care | Payer: Medicare Other | Source: Ambulatory Visit | Attending: Neurological Surgery | Admitting: Neurological Surgery

## 2013-05-05 ENCOUNTER — Ambulatory Visit (HOSPITAL_COMMUNITY): Payer: Medicare Other

## 2013-05-05 ENCOUNTER — Encounter (HOSPITAL_COMMUNITY): Payer: Medicare Other | Admitting: Certified Registered Nurse Anesthetist

## 2013-05-05 ENCOUNTER — Ambulatory Visit (HOSPITAL_COMMUNITY): Payer: Medicare Other | Admitting: Certified Registered Nurse Anesthetist

## 2013-05-05 ENCOUNTER — Encounter (HOSPITAL_COMMUNITY)
Admission: RE | Disposition: A | Discharge status: Home / Self Care | Payer: Self-pay | Source: Ambulatory Visit | Attending: Neurological Surgery

## 2013-05-05 ENCOUNTER — Encounter (HOSPITAL_COMMUNITY): Payer: Self-pay | Admitting: *Deleted

## 2013-05-05 DIAGNOSIS — Z7982 Long term (current) use of aspirin: Secondary | ICD-10-CM | POA: Diagnosis not present

## 2013-05-05 DIAGNOSIS — I10 Essential (primary) hypertension: Secondary | ICD-10-CM | POA: Diagnosis not present

## 2013-05-05 DIAGNOSIS — M519 Unspecified thoracic, thoracolumbar and lumbosacral intervertebral disc disorder: Secondary | ICD-10-CM | POA: Diagnosis not present

## 2013-05-05 DIAGNOSIS — Z87891 Personal history of nicotine dependence: Secondary | ICD-10-CM | POA: Diagnosis not present

## 2013-05-05 DIAGNOSIS — Z79899 Other long term (current) drug therapy: Secondary | ICD-10-CM | POA: Insufficient documentation

## 2013-05-05 DIAGNOSIS — K219 Gastro-esophageal reflux disease without esophagitis: Secondary | ICD-10-CM | POA: Diagnosis not present

## 2013-05-05 DIAGNOSIS — M5126 Other intervertebral disc displacement, lumbar region: Secondary | ICD-10-CM | POA: Diagnosis present

## 2013-05-05 HISTORY — PX: LUMBAR LAMINECTOMY/ DECOMPRESSION WITH MET-RX: SHX5959

## 2013-05-05 SURGERY — LUMBAR LAMINECTOMY/ DECOMPRESSION WITH MET-RX
Anesthesia: General | Site: Back | Laterality: Right

## 2013-05-05 MED ORDER — PHENOL 1.4 % MT LIQD: 1.0000 | OROMUCOSAL | Status: DC | PRN

## 2013-05-05 MED ORDER — DOCUSATE SODIUM 100 MG PO CAPS
100.0000 mg | ORAL_CAPSULE | Freq: Two times a day (BID) | ORAL | Status: DC
  Filled 2013-05-05: qty 1

## 2013-05-05 MED ORDER — EZETIMIBE 10 MG PO TABS
10.0000 mg | ORAL_TABLET | Freq: Every day | ORAL | Status: DC
  Filled 2013-05-05: qty 1

## 2013-05-05 MED ORDER — METHOCARBAMOL 100 MG/ML IJ SOLN
500.0000 mg | Freq: Four times a day (QID) | INTRAVENOUS | Status: DC | PRN
  Filled 2013-05-05: qty 5

## 2013-05-05 MED ORDER — PHENYLEPHRINE HCL 10 MG/ML IJ SOLN
INTRAMUSCULAR | Status: DC | PRN
  Administered 2013-05-05 (×2): 40 ug via INTRAVENOUS

## 2013-05-05 MED ORDER — OXYCODONE HCL 5 MG/5ML PO SOLN
5.0000 mg | Freq: Once | ORAL | Status: DC | PRN
Start: 2013-05-05 — End: 2013-05-05

## 2013-05-05 MED ORDER — HYDROMORPHONE HCL PF 1 MG/ML IJ SOLN: 0.2500 mg | INTRAMUSCULAR | Status: DC | PRN

## 2013-05-05 MED ORDER — KETOROLAC TROMETHAMINE 15 MG/ML IJ SOLN
INTRAMUSCULAR | Status: DC | PRN
  Administered 2013-05-05: 30 mg via INTRAVENOUS

## 2013-05-05 MED ORDER — GLYCOPYRROLATE 0.2 MG/ML IJ SOLN
INTRAMUSCULAR | Status: DC | PRN
  Administered 2013-05-05: .6 mg via INTRAVENOUS

## 2013-05-05 MED ORDER — SUCCINYLCHOLINE CHLORIDE 20 MG/ML IJ SOLN
INTRAMUSCULAR | Status: DC | PRN
  Administered 2013-05-05: 100 mg via INTRAVENOUS

## 2013-05-05 MED ORDER — OXYCODONE HCL 5 MG PO TABS: 5.0000 mg | ORAL_TABLET | Freq: Once | ORAL | Status: DC | PRN

## 2013-05-05 MED ORDER — ONDANSETRON HCL 4 MG/2ML IJ SOLN: 4.0000 mg | INTRAMUSCULAR | Status: DC | PRN

## 2013-05-05 MED ORDER — HEMOSTATIC AGENTS (NO CHARGE) OPTIME
TOPICAL | Status: DC | PRN
  Administered 2013-05-05: 1 via TOPICAL

## 2013-05-05 MED ORDER — ACETAMINOPHEN 650 MG RE SUPP: 650.0000 mg | RECTAL | Status: DC | PRN

## 2013-05-05 MED ORDER — SODIUM CHLORIDE 0.9 % IR SOLN
Status: DC | PRN
  Administered 2013-05-05: 09:00:00

## 2013-05-05 MED ORDER — FLEET ENEMA 7-19 GM/118ML RE ENEM
1.0000 | ENEMA | Freq: Once | RECTAL | Status: DC | PRN
  Filled 2013-05-05: qty 1

## 2013-05-05 MED ORDER — THROMBIN 5000 UNITS EX SOLR
CUTANEOUS | Status: DC | PRN
  Administered 2013-05-05 (×2): 5000 [IU] via TOPICAL

## 2013-05-05 MED ORDER — MENTHOL 3 MG MT LOZG: 1.0000 | LOZENGE | OROMUCOSAL | Status: DC | PRN

## 2013-05-05 MED ORDER — ALUM & MAG HYDROXIDE-SIMETH 200-200-20 MG/5ML PO SUSP: 30.0000 mL | Freq: Four times a day (QID) | ORAL | Status: DC | PRN

## 2013-05-05 MED ORDER — SULFASALAZINE 500 MG PO TABS
500.0000 mg | ORAL_TABLET | Freq: Two times a day (BID) | ORAL | Status: DC
Start: 2013-05-05 — End: 2013-05-05
  Filled 2013-05-05 (×2): qty 1

## 2013-05-05 MED ORDER — MIDAZOLAM HCL 2 MG/2ML IJ SOLN: 0.5000 mg | Freq: Once | INTRAMUSCULAR | Status: DC | PRN

## 2013-05-05 MED ORDER — ROCURONIUM BROMIDE 100 MG/10ML IV SOLN
INTRAVENOUS | Status: DC | PRN
  Administered 2013-05-05: 20 mg via INTRAVENOUS
  Administered 2013-05-05: 30 mg via INTRAVENOUS

## 2013-05-05 MED ORDER — PROPOFOL 10 MG/ML IV BOLUS
INTRAVENOUS | Status: DC | PRN
  Administered 2013-05-05: 50 mg via INTRAVENOUS
  Administered 2013-05-05: 150 mg via INTRAVENOUS

## 2013-05-05 MED ORDER — BISACODYL 10 MG RE SUPP: 10.0000 mg | Freq: Every day | RECTAL | Status: DC | PRN

## 2013-05-05 MED ORDER — ATENOLOL 25 MG PO TABS
25.0000 mg | ORAL_TABLET | Freq: Once | ORAL | Status: AC
  Administered 2013-05-05: 25 mg via ORAL
  Filled 2013-05-05: qty 1

## 2013-05-05 MED ORDER — POLYETHYLENE GLYCOL 3350 17 G PO PACK
17.0000 g | PACK | Freq: Every day | ORAL | Status: DC | PRN
  Filled 2013-05-05: qty 1

## 2013-05-05 MED ORDER — OXYCODONE-ACETAMINOPHEN 5-325 MG PO TABS: 1.0000 | ORAL_TABLET | ORAL | Status: DC | PRN

## 2013-05-05 MED ORDER — MIDAZOLAM HCL 5 MG/5ML IJ SOLN
INTRAMUSCULAR | Status: DC | PRN
  Administered 2013-05-05 (×2): 1 mg via INTRAVENOUS

## 2013-05-05 MED ORDER — KETOROLAC TROMETHAMINE 15 MG/ML IJ SOLN
15.0000 mg | Freq: Four times a day (QID) | INTRAMUSCULAR | Status: DC
  Administered 2013-05-05: 15 mg via INTRAVENOUS
  Filled 2013-05-05: qty 1

## 2013-05-05 MED ORDER — 0.9 % SODIUM CHLORIDE (POUR BTL) OPTIME
TOPICAL | Status: DC | PRN
  Administered 2013-05-05: 1000 mL

## 2013-05-05 MED ORDER — LACTATED RINGERS IV SOLN
INTRAVENOUS | Status: DC | PRN
  Administered 2013-05-05 (×2): via INTRAVENOUS

## 2013-05-05 MED ORDER — AMLODIPINE BESYLATE 2.5 MG PO TABS
2.5000 mg | ORAL_TABLET | Freq: Every day | ORAL | Status: DC
  Filled 2013-05-05: qty 1

## 2013-05-05 MED ORDER — DEXAMETHASONE SODIUM PHOSPHATE 10 MG/ML IJ SOLN
INTRAMUSCULAR | Status: AC
  Administered 2013-05-05: 10 mg via INTRAVENOUS
  Filled 2013-05-05: qty 1

## 2013-05-05 MED ORDER — BUPIVACAINE HCL (PF) 0.5 % IJ SOLN
INTRAMUSCULAR | Status: DC | PRN
  Administered 2013-05-05: 6 mL

## 2013-05-05 MED ORDER — METHOCARBAMOL 500 MG PO TABS: 500.0000 mg | ORAL_TABLET | Freq: Four times a day (QID) | ORAL | Status: DC | PRN

## 2013-05-05 MED ORDER — ONDANSETRON HCL 4 MG/2ML IJ SOLN
INTRAMUSCULAR | Status: DC | PRN
  Administered 2013-05-05: 4 mg via INTRAVENOUS

## 2013-05-05 MED ORDER — MORPHINE SULFATE 2 MG/ML IJ SOLN: 1.0000 mg | INTRAMUSCULAR | Status: DC | PRN

## 2013-05-05 MED ORDER — SODIUM CHLORIDE 0.9 % IJ SOLN: 3.0000 mL | INTRAMUSCULAR | Status: DC | PRN

## 2013-05-05 MED ORDER — ATENOLOL 25 MG PO TABS
25.0000 mg | ORAL_TABLET | Freq: Two times a day (BID) | ORAL | Status: DC
  Filled 2013-05-05 (×2): qty 1

## 2013-05-05 MED ORDER — SENNA 8.6 MG PO TABS: 1.0000 | ORAL_TABLET | Freq: Two times a day (BID) | ORAL | Status: DC

## 2013-05-05 MED ORDER — FENTANYL CITRATE 0.05 MG/ML IJ SOLN
INTRAMUSCULAR | Status: DC | PRN
  Administered 2013-05-05: 50 ug via INTRAVENOUS
  Administered 2013-05-05: 200 ug via INTRAVENOUS
  Administered 2013-05-05: 50 ug via INTRAVENOUS

## 2013-05-05 MED ORDER — SODIUM CHLORIDE 0.9 % IJ SOLN
3.0000 mL | Freq: Two times a day (BID) | INTRAMUSCULAR | Status: DC
Start: 2013-05-05 — End: 2013-05-05
  Administered 2013-05-05: 3 mL via INTRAVENOUS

## 2013-05-05 MED ORDER — NEOSTIGMINE METHYLSULFATE 1 MG/ML IJ SOLN
INTRAMUSCULAR | Status: DC | PRN
  Administered 2013-05-05: 4 mg via INTRAVENOUS

## 2013-05-05 MED ORDER — ACETAMINOPHEN 325 MG PO TABS: 650.0000 mg | ORAL_TABLET | ORAL | Status: DC | PRN

## 2013-05-05 MED ORDER — PROMETHAZINE HCL 25 MG/ML IJ SOLN: 6.2500 mg | INTRAMUSCULAR | Status: DC | PRN

## 2013-05-05 MED ORDER — MEPERIDINE HCL 25 MG/ML IJ SOLN: 6.2500 mg | INTRAMUSCULAR | Status: DC | PRN

## 2013-05-05 SURGICAL SUPPLY — 55 items
ADH SKN CLS APL DERMABOND .7 (GAUZE/BANDAGES/DRESSINGS)
ADH SKN CLS LQ APL DERMABOND (GAUZE/BANDAGES/DRESSINGS) ×1
BAG DECANTER FOR FLEXI CONT (MISCELLANEOUS) ×2 IMPLANT
BLADE SURG 15 STRL LF DISP TIS (BLADE) IMPLANT
BLADE SURG 15 STRL SS (BLADE)
BLADE SURG ROTATE 9660 (MISCELLANEOUS) IMPLANT
CANISTER SUCT 3000ML (MISCELLANEOUS) ×2 IMPLANT
CONT SPEC 4OZ CLIKSEAL STRL BL (MISCELLANEOUS) ×2 IMPLANT
DECANTER SPIKE VIAL GLASS SM (MISCELLANEOUS) IMPLANT
DERMABOND ADHESIVE PROPEN (GAUZE/BANDAGES/DRESSINGS) ×1
DERMABOND ADVANCED (GAUZE/BANDAGES/DRESSINGS)
DERMABOND ADVANCED .7 DNX12 (GAUZE/BANDAGES/DRESSINGS) IMPLANT
DERMABOND ADVANCED .7 DNX6 (GAUZE/BANDAGES/DRESSINGS) IMPLANT
DRAPE C-ARM 42X72 X-RAY (DRAPES) ×4 IMPLANT
DRAPE LAPAROTOMY 100X72 PEDS (DRAPES) ×2 IMPLANT
DRAPE MICROSCOPE LEICA (MISCELLANEOUS) ×2 IMPLANT
DRAPE POUCH INSTRU U-SHP 10X18 (DRAPES) ×2 IMPLANT
DURAPREP 6ML APPLICATOR 50/CS (WOUND CARE) ×2 IMPLANT
ELECT BLADE 6.5 EXT (BLADE) ×2 IMPLANT
ELECT REM PT RETURN 9FT ADLT (ELECTROSURGICAL) ×2
ELECTRODE REM PT RTRN 9FT ADLT (ELECTROSURGICAL) ×1 IMPLANT
GAUZE SPONGE 4X4 16PLY XRAY LF (GAUZE/BANDAGES/DRESSINGS) IMPLANT
GLOVE BIOGEL PI IND STRL 6.5 (GLOVE) ×2 IMPLANT
GLOVE BIOGEL PI IND STRL 8.5 (GLOVE) ×1 IMPLANT
GLOVE BIOGEL PI INDICATOR 6.5 (GLOVE) ×2
GLOVE BIOGEL PI INDICATOR 8.5 (GLOVE) ×1
GLOVE ECLIPSE 6.5 STRL STRAW (GLOVE) ×1 IMPLANT
GLOVE ECLIPSE 8.5 STRL (GLOVE) ×2 IMPLANT
GLOVE EXAM NITRILE LRG STRL (GLOVE) IMPLANT
GLOVE EXAM NITRILE MD LF STRL (GLOVE) IMPLANT
GLOVE EXAM NITRILE XL STR (GLOVE) IMPLANT
GLOVE EXAM NITRILE XS STR PU (GLOVE) IMPLANT
GLOVE SS BIOGEL STRL SZ 6.5 (GLOVE) ×2 IMPLANT
GLOVE SUPERSENSE BIOGEL SZ 6.5 (GLOVE) ×2
GOWN BRE IMP SLV AUR LG STRL (GOWN DISPOSABLE) ×2 IMPLANT
GOWN BRE IMP SLV AUR XL STRL (GOWN DISPOSABLE) IMPLANT
GOWN STRL REIN 2XL LVL4 (GOWN DISPOSABLE) ×2 IMPLANT
KIT BASIN OR (CUSTOM PROCEDURE TRAY) ×2 IMPLANT
KIT ROOM TURNOVER OR (KITS) ×2 IMPLANT
NDL SPNL 20GX3.5 QUINCKE YW (NEEDLE) IMPLANT
NEEDLE HYPO 18GX1.5 BLUNT FILL (NEEDLE) IMPLANT
NEEDLE HYPO 22GX1.5 SAFETY (NEEDLE) ×2 IMPLANT
NEEDLE SPNL 20GX3.5 QUINCKE YW (NEEDLE) ×2 IMPLANT
NS IRRIG 1000ML POUR BTL (IV SOLUTION) ×2 IMPLANT
PACK LAMINECTOMY NEURO (CUSTOM PROCEDURE TRAY) ×2 IMPLANT
PAD ARMBOARD 7.5X6 YLW CONV (MISCELLANEOUS) ×6 IMPLANT
RUBBERBAND STERILE (MISCELLANEOUS) ×4 IMPLANT
SPONGE SURGIFOAM ABS GEL SZ50 (HEMOSTASIS) ×2 IMPLANT
SUT VIC AB 3-0 SH 8-18 (SUTURE) ×2 IMPLANT
SYR 20ML ECCENTRIC (SYRINGE) ×2 IMPLANT
SYR 5ML LL (SYRINGE) IMPLANT
TOWEL OR 17X24 6PK STRL BLUE (TOWEL DISPOSABLE) ×2 IMPLANT
TOWEL OR 17X26 10 PK STRL BLUE (TOWEL DISPOSABLE) ×2 IMPLANT
WATER STERILE IRR 1000ML POUR (IV SOLUTION) ×2 IMPLANT
WIRE TIP MIS 2.5MM NEURO (BURR) ×2 IMPLANT

## 2013-05-05 NOTE — Transfer of Care (Signed)
Immediate Anesthesia Transfer of Care Note  Patient: [name]  Procedure(s) Performed: Procedure(s) with comments: Right Lumbar three-four Extraforaminal Microdiskectomy with Metrex (Right) - Right Lumbar three-four Extraforaminal Microdiskectomy with Metrex  Patient Location: PACU  Anesthesia Type:General  Level of Consciousness: awake, alert  and oriented  Airway & Oxygen Therapy: Patient Spontanous Breathing  Post-op Assessment: Report given to PACU RN  Post vital signs: Reviewed and stable  Complications: No apparent anesthesia complications

## 2013-05-05 NOTE — H&P (Signed)
CHIEF COMPLAINT:                                          Pain in the lower back and on right leg.  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  I  have seen him in the past for cervical spondylotic changes.  However, he tells me that around the Thanksgiving holiday, he developed significant pain in the buttock, right lower extremity radiating down the anterior and posterior aspect of his thigh.  Pain has become progressively worse.  He was seen by Dr. [name], who gave him a prednisone dose course and he notes that this has had little effect.  He also was started on some hydrocodone and he found that this would not allow him to sleep and was not controlling his pain very well.  He since has been given a prescription for oxycodone and he underwent an MRI of the lumbar spine that was performed on 04/21/2013.  The study is brought in for review and it demonstrates that he has evidence of disk degenerative changes at L2-3 and L3-4.  At L3-4 on the right side, there is presence of a foraminal disc herniation that elevates and compresses the exiting L3 nerve root on the right side.  I demonstrated the findings to [name] and his wife.  He notes that he has otherwise been fairly healthy and he has been doing well; however, this pain has become increasingly intolerable.  REVIEW OF SYSTEMS:                                    Systems review is notable for the back pain and leg pain on the 14-point review sheet in addition to some wearing glasses and ringing in the ears.  PAST MEDICAL HISTORY:                                    Current Medical Conditions:  Reveals that he has generally had good health, has some GI symptoms.    Prior Operations:  Notable for some rotator cuff surgery, cholecystectomy in the past, and some foot surgery.    Medications and Allergies:  His current medications include atenolol, Norvasc,  aspirin, cetirizine, sulfasalazine, prednisone, calcium, Zetia, Soma, and Fosamax for his bones.  He also has been recently started on oxycodone.  NOTES AN ALLERGY TO XYLOCAINE.  SOCIAL HISTORY:                                            He does not smoke.  He drinks alcohol on a social basis. Height and weight have been stable at 185 pounds and 5 feet 9 inches.  PHYSICAL EXAMINATION:                                On physical examination, I note that he has some weakness in the right quad compared  to the left side, and he has an absent quad reflex on the right side.  He has trace Achilles reflexes bilaterally.  His left quad reflex is intact.  IMPRESSION AND PLAN:                                 The patient has evidence of a foraminal disc herniation at L3-4 on the right side.  He also has some spondylotic changes at L2-3, but I do not believe that these are relevant to the current difficulties.  I discussed with him a plan of treatment for this process.  I have suggested that we use some larger doses of steroids in the form of Decadron 4 mg twice a day for the next couple of days to see if this gives him some relief.  If it does give him substantial relief, then we may consider transforaminal steroid injection at L3-4 on the right side.  If it is not effective, then ultimately he may require surgical extirpation of the disc and I discussed the procedure using a Metrx to dilator system to approach the area, doing a minimal amount of dissection and removing the disc fragment itself.  I  noted that this would simply decompress the nerve root, hopefully alleviating the pressure and pain on the nerve, thereafter the nerve can heal itself.  I did note that he does have findings of degenerative changes of the disc and there is a chance of recurrent disc herniation, which is about 15%.  Nonetheless, I believe that decompressing this nerve root would likely give him good relief of the symptoms.  We will see how he  does with the oral Decadron; however, we will also plan on scheduling an extraforaminal discectomy at L3-4 on the right side in the coming week if this is not successful.

## 2013-05-05 NOTE — Plan of Care (Signed)
Problem: Consults Goal: Diagnosis - Spinal Surgery Outcome: Completed/Met Date Met:  05/05/13 Lumbar Laminectomy (Complex)     

## 2013-05-05 NOTE — Op Note (Signed)
Date of surgery: 05/05/2013  Pre op Diagnosis: Lumbar herniated nucleus pulposus extraforaminal . Right lumbar radiculopathy L3-L4   Post op Diagnosis:Lumbar herniated nucleus pulposus extraforaminal L3-4 right with lumbar radiculopathy   Procedure: Microdiscectomy L3-4 left, extraforaminal with Metrix retractor, operating microscope  Surgeon:Tihanna Goodson, MD  Assistant: Shirlean Kelly M.D.  Anesthesia: General endotracheal  Indications: Patient is a 67 year old individual is had severe right buttock and hip pain for approximately 2 weeks time. He's not responded well to oral prednisone or Decadron. He's been advised regarding the need for microdiscectomy in the extraforaminal space as an MRI demonstrates a large fragment of disc at L3-L4 on the right side.  Procedure: The patient was brought to the operating room supine on a stretcher. After the smooth induction of general endotracheal anesthesia the patient was turned prone onto the operating table. The bony prominences were appropriately padded and protected. The back was prepped with alcohol and DuraPrep and draped sterilely. Fluoroscopic guidance was used to localize the appropriate level right L3-L4. The skin was infiltrated with 5 cc  half percent Marcaine. A small vertical incision an inch in length was created. A K wire was passed to the laminar arch of L3 and a wanding technique was used to create a space around the lamina and pars. Dilators were then passed to dilate to the 20 mm diameter. A 5 centimeter by  cannula was fixed to the operating table with a clamp.  The operating microscope was brought into the field. Through this aperture the soft tissues were cleared from the lateral aspect of the pars and the superior articular process of the facet joint. The intertransverse space was cleared of soft tissues. A curette was used to remove remnants of soft tissue and then a high-speed drill was used to remove the lateral  aspect of the pars and the superior articular process. The undersurface of the lateral aspect was then examined. A 2 and 3 mm Kerrison punch was used to remove further bone from the lateral aspect of the pars to allow exploration of the exiting L3 nerve root. The nerve root was mobilized from above and below and a mass of the disc was identified.  The nerve root was carefully protected and dissection about the nerve root was performed both from the inferior aspect of the nerve and the superior aspect of the nerve. A series of ball dissectors were used to facilitate removal and freeing of any loose disc material. In the end the nerve root was decompressed of the surrounding disc material and its path was free and clear into the extraforaminal zone. Hemostasis was obtained using small pledgets of Gelfoam and careful bipolar cautery.  When adequate hemostasis was achieved the endoscopic cannula was removed, the microscope was removed, and closure of the incision was undertaken with 3-0 Vicryl in the fascia and 3-0 Vicryl in the subcuticular skin. Dermabond was used to cover the skin.Blood loss was 10 cc. The patient tolerated the procedure and was returned to the recovery room in stable condition.

## 2013-05-05 NOTE — Progress Notes (Signed)
PT Cancellation Note  Patient Details Name: [name] MRN: [id number] DOB: 03-Jul-1945   Cancelled Treatment:     Pt actively discharging as we speak and is declining PT services prior to discharge.   Marcene Brawn 05/05/2013, 2:21 PM

## 2013-05-05 NOTE — Anesthesia Preprocedure Evaluation (Addendum)
Anesthesia Evaluation  Patient identified by MRN, date of birth, ID band Patient awake    Reviewed: Allergy & Precautions, H&P , NPO status , Patient's Chart, lab work & pertinent test results, reviewed documented beta blocker date and time   History of Anesthesia Complications Negative for: history of anesthetic complications  Airway Mallampati: II TM Distance: >3 FB Neck ROM: Full    Dental  (+) Teeth Intact   Pulmonary former smoker,  breath sounds clear to auscultation  Pulmonary exam normal       Cardiovascular hypertension, Pt. on home beta blockers and Pt. on medications Rhythm:Regular Rate:Normal  '11 ECHO: EF 55%   Neuro/Psych Back pain: narcotics    GI/Hepatic Neg liver ROS, GERD-  Medicated and Poorly Controlled,  Endo/Other  negative endocrine ROS  Renal/GU negative Renal ROS     Musculoskeletal  (+) Arthritis -, Rheumatoid disorders,    Abdominal (+) - obese,   Peds  Hematology negative hematology ROS (+)   Anesthesia Other Findings   Reproductive/Obstetrics                       Anesthesia Physical Anesthesia Plan  ASA: II  Anesthesia Plan: General   Post-op Pain Management:    Induction: Intravenous  Airway Management Planned: Oral ETT  Additional Equipment:   Intra-op Plan:   Post-operative Plan: Extubation in OR  Informed Consent: I have reviewed the patients History and Physical, chart, labs and discussed the procedure including the risks, benefits and alternatives for the proposed anesthesia with the patient or authorized representative who has indicated his/her understanding and acceptance.   Dental advisory given  Plan Discussed with: CRNA, Anesthesiologist and Surgeon  Anesthesia Plan Comments: (Plan routine monitors, GETA )       Anesthesia Quick Evaluation

## 2013-05-05 NOTE — Anesthesia Postprocedure Evaluation (Signed)
  Anesthesia Post-op Note  Patient: [name]  Procedure(s) Performed: Procedure(s) with comments: Right Lumbar three-four Extraforaminal Microdiskectomy with Metrex (Right) - Right Lumbar three-four Extraforaminal Microdiskectomy with Metrex  Patient Location: PACU  Anesthesia Type:General  Level of Consciousness: awake, alert , oriented and patient cooperative  Airway and Oxygen Therapy: Patient Spontanous Breathing and Patient connected to nasal cannula oxygen  Post-op Pain: mild  Post-op Assessment: Post-op Vital signs reviewed, Patient's Cardiovascular Status Stable, Respiratory Function Stable, Patent Airway, No signs of Nausea or vomiting and Pain level controlled  Post-op Vital Signs: Reviewed and stable  Complications: No apparent anesthesia complications

## 2013-05-05 NOTE — Preoperative (Signed)
Beta Blockers   Reason not to administer Beta Blockers:Not Applicable 

## 2013-05-05 NOTE — Discharge Summary (Signed)
Physician Discharge Summary  [name] MRN: [id number] DOB/AGE: [birth year] 67 y.o.  Admit date: 05/05/2013 Discharge date: 05/05/2013  Admission Diagnoses: Herniated nucleus pulposus L3-L4 right with right lumbar radiculopathy  Discharge Diagnoses: Herniated nucleus pulposus L3-L4 right with right lumbar radiculopathy Active Problems:   Herniated nucleus pulposus, L3-4 right   Discharged Condition: good  Hospital Course: Patient was admitted to undergo surgical decompression via an extraforaminal discectomy using Metrix retractor and operating microscope. He tolerated the surgery well.  Consults: None  Significant Diagnostic Studies: None  Treatments: surgery: Extraforaminal microdiscectomy using Metrix operating system and operating microscope.  Discharge Exam: Blood pressure 124/77, pulse 77, temperature 97.5 F (36.4 C), temperature source Oral, resp. rate 18, SpO2 92.00%. Motor function is intact in both lower sternum his incision is clean and dry.  Disposition:  discharge home  Discharge Orders   Future Appointments Provider Department Dept Phone   06/29/2013 9:30 AM Malissa Hippo, MD [facility] [phone]   Future Orders Complete By Expires   Call MD for:  redness, tenderness, or signs of infection (pain, swelling, redness, odor or green/yellow discharge around incision site)  As directed    Call MD for:  severe uncontrolled pain  As directed    Call MD for:  temperature >100.4  As directed    Diet - low sodium heart healthy  As directed    Discharge instructions  As directed    Comments:     Okay to shower. Do not apply salves or appointments to incision. No heavy lifting with the upper extremities greater than 15 pounds. May resume driving when not requiring pain medication and patient feels comfortable with doing so.   Increase activity slowly  As directed        Medication List         alendronate 70 MG tablet   Commonly known as:  FOSAMAX  Take 70 mg by mouth every 7 (seven) days. Take with a full glass of water on an empty stomach.     amLODipine 2.5 MG tablet  Commonly known as:  NORVASC  Take 2.5 mg by mouth daily.     aspirin 81 MG tablet  Take 81 mg by mouth. The patient states that he takes every other day     atenolol 25 MG tablet  Commonly known as:  TENORMIN  Take 25 mg by mouth 2 (two) times daily.     carisoprodol 350 MG tablet  Commonly known as:  SOMA  Take 350 mg by mouth daily as needed for muscle spasms.     CITRACAL + D PO  Take 1 tablet by mouth daily.     diphenoxylate-atropine 2.5-0.025 MG per tablet  Commonly known as:  LOMOTIL  Take 2 tablets by mouth every morning.     ezetimibe 10 MG tablet  Commonly known as:  ZETIA  Take 10 mg by mouth daily.     HYDROcodone-acetaminophen 5-325 MG per tablet  Commonly known as:  NORCO/VICODIN  Take 1 tablet by mouth at bedtime as needed for moderate pain.     naproxen sodium 220 MG tablet  Commonly known as:  ANAPROX  Take 440 mg by mouth 2 (two) times daily as needed (pain).     ondansetron 4 MG tablet  Commonly known as:  ZOFRAN  Take 4 mg by mouth every 12 (twelve) hours as needed for nausea or vomiting.     oxyCODONE-acetaminophen 7.5-325 MG per tablet  Commonly  known as:  PERCOCET  Take 1 tablet by mouth at bedtime as needed for pain.     predniSONE 10 MG tablet  Commonly known as:  DELTASONE  Take 10 mg by mouth daily with breakfast.     sulfaSALAzine 500 MG tablet  Commonly known as:  AZULFIDINE  Take 500 mg by mouth 2 (two) times daily.         SignedStefani Dama 05/05/2013, 2:01 PM

## 2013-05-05 NOTE — Progress Notes (Signed)
Pt. alert and oriented,follows simple instructions, denies pain. Incision area without swelling, redness or S/S of infection. Voiding adequate clear yellow urine. Moving all extremities well and vitals stable and documented. Patient discharged home with spouse. Lumbar surgery notes instructions given to patient and family member for home safety and precautions. Pt. and family stated understanding of instructions given.

## 2013-05-06 ENCOUNTER — Encounter (HOSPITAL_COMMUNITY): Payer: Self-pay | Admitting: Neurological Surgery

## 2013-05-10 ENCOUNTER — Other Ambulatory Visit (INDEPENDENT_AMBULATORY_CARE_PROVIDER_SITE_OTHER): Payer: Self-pay | Admitting: Internal Medicine

## 2013-06-02 ENCOUNTER — Other Ambulatory Visit: Payer: Self-pay | Admitting: Cardiovascular Disease

## 2013-06-02 NOTE — Telephone Encounter (Signed)
Rx was sent to pharmacy electronically. 

## 2013-06-17 ENCOUNTER — Other Ambulatory Visit: Payer: Self-pay | Admitting: Cardiovascular Disease

## 2013-06-17 NOTE — Telephone Encounter (Signed)
Rx was sent to pharmacy electronically. 

## 2013-06-29 ENCOUNTER — Encounter (INDEPENDENT_AMBULATORY_CARE_PROVIDER_SITE_OTHER): Payer: Self-pay | Admitting: Internal Medicine

## 2013-06-29 ENCOUNTER — Ambulatory Visit (INDEPENDENT_AMBULATORY_CARE_PROVIDER_SITE_OTHER): Payer: Medicare Other | Admitting: Internal Medicine

## 2013-06-29 VITALS — BP 116/70 | HR 76 | Temp 97.2°F | Resp 18 | Ht 69.0 in | Wt 187.1 lb

## 2013-06-29 DIAGNOSIS — K529 Noninfective gastroenteritis and colitis, unspecified: Secondary | ICD-10-CM

## 2013-06-29 DIAGNOSIS — R197 Diarrhea, unspecified: Secondary | ICD-10-CM

## 2013-06-29 MED ORDER — PREDNISONE 2 MG PO TBEC: 8.0000 mg | DELAYED_RELEASE_TABLET | Freq: Every day | ORAL | Status: DC

## 2013-06-29 NOTE — Progress Notes (Signed)
Presenting complaint;  Follow for chronic diarrhea.  Subjective:  Patient is 68 year old Caucasian male with several year history of diarrhea with negative workup and responsive to prednisone. He was also tried on Imuran but it did not work. He is presently taking prednisone 10 mg a day and 2 Lomotil tablets every morning. He feels fine. On most days he has 2 formed stools. Every now and then he has a third stool. He has intermittent nausea and usually takes 2 ondansetron tablets a week. He has good appetite and his weight has been stable. He had lumbar spine surgery on 05/05/2013. He did develop constipation while he was on pain medications. He has history of rheumatoid arthritis and osteoporosis and sees Dr. Charlestine Night.    Current Medications: Current Outpatient Prescriptions  Medication Sig Dispense Refill  . alendronate (FOSAMAX) 70 MG tablet Take 70 mg by mouth every 7 (seven) days. Take with a full glass of water on an empty stomach.      Marland Kitchen amLODipine (NORVASC) 2.5 MG tablet Take 2.5 mg by mouth daily.      Marland Kitchen aspirin 81 MG tablet Take 81 mg by mouth. The patient states that he takes every other day      . atenolol (TENORMIN) 25 MG tablet TAKE ONE TABLET BY MOUTH TWICE A DAY  180 tablet  3  . Calcium Citrate-Vitamin D (CITRACAL + D PO) Take 1 tablet by mouth daily.       . carisoprodol (SOMA) 350 MG tablet Take 350 mg by mouth daily as needed for muscle spasms.       . diphenoxylate-atropine (LOMOTIL) 2.5-0.025 MG per tablet TAKE TWO (2) TABLETS BY MOUTH EVERY MORNING  60 tablet  5  . ondansetron (ZOFRAN) 4 MG tablet Take 4 mg by mouth every 12 (twelve) hours as needed for nausea or vomiting.      . predniSONE (DELTASONE) 10 MG tablet Take 10 mg by mouth every evening.       . sulfaSALAzine (AZULFIDINE) 500 MG tablet Take 500 mg by mouth 2 (two) times daily.      Marland Kitchen ZETIA 10 MG tablet TAKE ONE (1) TABLET EACH DAY  30 tablet  11   No current facility-administered medications for this  visit.     Objective: Blood pressure 116/70, pulse 76, temperature 97.2 F (36.2 C), temperature source Oral, resp. rate 18, height 5\' 9"  (1.753 m), weight 187 lb 1.6 oz (84.868 kg). Patient is alert and in no acute distress. Conjunctiva is pink. Sclera is nonicteric Oropharyngeal mucosa is normal. No neck masses or thyromegaly noted. Cardiac exam with regular rhythm normal S1 and S2. No murmur or gallop noted. Lungs are clear to auscultation. Abdomen is full. Bowel sounds are normal. On palpation it is soft and nontender without organomegaly or masses   No LE edema or clubbing noted.  Labs/studies Results: C-reactive protein on 12/16/2012 was less than 0.5  Assessment:  #1.Chronic steroid responsive diarrhea. He has undergone multiple studies and no evidence found to make a diagnosis of inflammatory bowel disease. On one occasion his CRP was elevated but that could have been due to RA. He is also on low-dose sulfasalazine but it has not helped. #2.Osteoporosis.   Plan:  Continued diphenoxylate at current dosages 2 tablets every morning. Increase Citracal D to 2 tablets daily. Decrease prednisone dose to 8 mg by mouth every morning. If he does well with prednisone dose reduction will consider dropping the dose to 7 mg a day in 2-3  months. Office visit in 6 months.

## 2013-06-29 NOTE — Patient Instructions (Addendum)
Decrease prednisone to 8 mg by mouth every morning. Take 2 Citracal D tablets daily instead of one Notify if diarrhea relapses. Progress report in 2 months

## 2013-07-01 ENCOUNTER — Other Ambulatory Visit: Payer: Self-pay | Admitting: Cardiovascular Disease

## 2013-07-01 NOTE — Telephone Encounter (Signed)
Rx was sent to pharmacy electronically. 

## 2013-07-14 DIAGNOSIS — L259 Unspecified contact dermatitis, unspecified cause: Secondary | ICD-10-CM | POA: Diagnosis not present

## 2013-09-01 ENCOUNTER — Telehealth (INDEPENDENT_AMBULATORY_CARE_PROVIDER_SITE_OTHER): Payer: Self-pay | Admitting: *Deleted

## 2013-09-01 DIAGNOSIS — M069 Rheumatoid arthritis, unspecified: Secondary | ICD-10-CM | POA: Diagnosis not present

## 2013-09-01 DIAGNOSIS — L259 Unspecified contact dermatitis, unspecified cause: Secondary | ICD-10-CM | POA: Diagnosis not present

## 2013-09-01 DIAGNOSIS — Z79899 Other long term (current) drug therapy: Secondary | ICD-10-CM | POA: Diagnosis not present

## 2013-09-01 NOTE — Telephone Encounter (Signed)
Forwarded to Dr.Rehman for review. 

## 2013-09-01 NOTE — Telephone Encounter (Signed)
Dr. 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 He feels this is the problems he has had for years. He also has been off the Lotomil and Prednisone for 2 weeks and is doing good. Canceled his Aug. apt and would like to know when he should schedule for next f/u? The return phone number is [phone].

## 2013-09-03 NOTE — Telephone Encounter (Signed)
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

## 2013-09-03 NOTE — Telephone Encounter (Signed)
Need to know the diagnosis and he can call for office visit if symptoms relapse.

## 2013-09-03 NOTE — Telephone Encounter (Signed)
[name] per Dr.Rehman the patient may make an appointment when needed.

## 2013-09-03 NOTE — Telephone Encounter (Signed)
I have called PCP but they remain closed for lunch.

## 2013-09-04 NOTE — Telephone Encounter (Signed)
LM that Dr. Laural Golden has been advised of his phone message on 09/01/13. He has also confirmed everything with Dr. Nevada Crane and he could schedule an apt when needed. To please return the call at [phone].

## 2013-09-07 DIAGNOSIS — L600 Ingrowing nail: Secondary | ICD-10-CM | POA: Diagnosis not present

## 2013-09-07 DIAGNOSIS — B353 Tinea pedis: Secondary | ICD-10-CM | POA: Diagnosis not present

## 2013-10-14 ENCOUNTER — Encounter: Payer: Self-pay | Admitting: Podiatry

## 2013-10-14 ENCOUNTER — Ambulatory Visit: Payer: Medicare Other | Admitting: Podiatry

## 2013-10-14 VITALS — BP 114/70 | HR 77 | Resp 16 | Ht 69.0 in | Wt 180.0 lb

## 2013-10-14 DIAGNOSIS — L600 Ingrowing nail: Secondary | ICD-10-CM | POA: Diagnosis not present

## 2013-10-14 NOTE — Patient Instructions (Signed)

## 2013-10-14 NOTE — Progress Notes (Signed)
Subjective:     [name], male   DOB: 11-Aug-1945, 68 y.o.   MRN: [id number]  Toe Pain    patient presents with damage big toenail left that has redness has had drainage occurring mostly on the medial side also proximal with probable history of trauma   Review of Systems  All other systems reviewed and are negative.      Objective:   Physical Exam  Nursing note and vitals reviewed. Constitutional: He is oriented to person, place, and time.  Cardiovascular: Intact distal pulses.   Musculoskeletal: Normal range of motion.  Neurological: He is oriented to person, place, and time.  Skin: Skin is dry.   neurovascular status intact with muscle strength adequate range of motion subtalar and midtarsal joint within normal limits. Left big toenail distal two thirds is loose with redness and drainage occurring on both the medial and lateral side with mild discomfort noted. Digits found to be well perfused and arch height normal    Assessment:     Probable paronychia infection left hallux with trauma most likely a part of the problem    Plan:     H&P performed infiltrated 60 mg I can Marcaine mixture removed the hallux nail borders proud flesh and abscess tissue which is all localized with no proximal edema erythema drainage noted. Applied sterile dressing instructed on soaks and reappoint as needed

## 2013-10-14 NOTE — Progress Notes (Signed)
   Subjective:    [name], male    DOB: 15-Jun-1945, 68 y.o.   MRN: [id number]  HPI Comments: "I have a bad toenail"  Patient c/o achy 1st toe left for about 4 weeks. The toenail is thickened and discolored and loose at the base of toe. The corners are ingrown. He says that it was infected and has now cleared with antibiotic Rx'd by PCP. PCP recommended toenail removal.  Toe Pain       Review of Systems  Eyes: Positive for redness.  Musculoskeletal: Positive for back pain.  Skin: Positive for rash.       Change in nails  Allergic/Immunologic: Positive for food allergies.  All other systems reviewed and are negative.      Objective:   Physical Exam        Assessment & Plan:

## 2013-12-28 ENCOUNTER — Ambulatory Visit (INDEPENDENT_AMBULATORY_CARE_PROVIDER_SITE_OTHER): Payer: Medicare Other | Admitting: Internal Medicine

## 2014-01-04 ENCOUNTER — Other Ambulatory Visit (INDEPENDENT_AMBULATORY_CARE_PROVIDER_SITE_OTHER): Payer: Self-pay | Admitting: Internal Medicine

## 2014-01-11 DIAGNOSIS — M130 Polyarthritis, unspecified: Secondary | ICD-10-CM | POA: Diagnosis not present

## 2014-01-11 DIAGNOSIS — M545 Low back pain, unspecified: Secondary | ICD-10-CM | POA: Diagnosis not present

## 2014-01-11 DIAGNOSIS — M722 Plantar fascial fibromatosis: Secondary | ICD-10-CM | POA: Diagnosis not present

## 2014-01-14 ENCOUNTER — Other Ambulatory Visit (INDEPENDENT_AMBULATORY_CARE_PROVIDER_SITE_OTHER): Payer: Self-pay | Admitting: Internal Medicine

## 2014-01-20 DIAGNOSIS — K589 Irritable bowel syndrome without diarrhea: Secondary | ICD-10-CM | POA: Diagnosis not present

## 2014-02-10 DIAGNOSIS — K589 Irritable bowel syndrome without diarrhea: Secondary | ICD-10-CM | POA: Diagnosis not present

## 2014-04-12 DIAGNOSIS — H5213 Myopia, bilateral: Secondary | ICD-10-CM | POA: Diagnosis not present

## 2014-04-12 DIAGNOSIS — H524 Presbyopia: Secondary | ICD-10-CM | POA: Diagnosis not present

## 2014-04-12 DIAGNOSIS — H3530 Unspecified macular degeneration: Secondary | ICD-10-CM | POA: Diagnosis not present

## 2014-04-12 DIAGNOSIS — Z23 Encounter for immunization: Secondary | ICD-10-CM | POA: Diagnosis not present

## 2014-04-12 DIAGNOSIS — H52223 Regular astigmatism, bilateral: Secondary | ICD-10-CM | POA: Diagnosis not present

## 2014-04-14 ENCOUNTER — Ambulatory Visit (INDEPENDENT_AMBULATORY_CARE_PROVIDER_SITE_OTHER): Payer: Medicare Other | Admitting: Cardiovascular Disease

## 2014-04-14 ENCOUNTER — Encounter: Payer: Self-pay | Admitting: Cardiovascular Disease

## 2014-04-14 VITALS — BP 110/66 | HR 74 | Ht 69.0 in | Wt 187.1 lb

## 2014-04-14 DIAGNOSIS — E785 Hyperlipidemia, unspecified: Secondary | ICD-10-CM

## 2014-04-14 DIAGNOSIS — I10 Essential (primary) hypertension: Secondary | ICD-10-CM

## 2014-04-14 DIAGNOSIS — A071 Giardiasis [lambliasis]: Secondary | ICD-10-CM | POA: Diagnosis not present

## 2014-04-14 DIAGNOSIS — E782 Mixed hyperlipidemia: Secondary | ICD-10-CM | POA: Diagnosis not present

## 2014-04-14 DIAGNOSIS — Z8679 Personal history of other diseases of the circulatory system: Secondary | ICD-10-CM | POA: Diagnosis not present

## 2014-04-14 DIAGNOSIS — Z79899 Other long term (current) drug therapy: Secondary | ICD-10-CM | POA: Diagnosis not present

## 2014-04-14 NOTE — Patient Instructions (Signed)
Your physician wants you to follow-up in: 1 year or sooner if needed. You will receive a reminder letter in the mail two months in advance. If you don't receive a letter, please call our office to schedule the follow-up appointment.  Your physician recommends that you return for lab work. The requested lab orders has been provided for you to carry to Dr. Nevada Crane to be included with what he plans to order. Please be sure that Dr. Claiborne Billings receive  a copy of these  lab reports.

## 2014-04-14 NOTE — Progress Notes (Signed)
[name], male   DOB: 07/28/1945, 68 y.o.   MRN: [id number]    Follow-up  HPI: [name] is a 68 y.o. male  Who presents for one-year cardiology follow-up evaluation.  [name] has remote history of viral myocarditis in 1989 at which time his ejection fraction was 25%. He subsequently normalized LV function. He has a history of mild mitral valve prolapse. Cardiac catheterization in 2001 showed mild mid systolic LAD bridging. He has been noted to have mild T-wave changes on his ECG. Additional problems include hypertension as well as hyperlipidemia. He was unable to tolerate Lipitor. He initially tolerated Crestor but then developed significant myalgias.  He has been able to tolerate Zetia 10 mg daily.   Since I last saw him over the year, he has remained stable from a cardiac standpoint.  He specifically denies chest pain.  He denies palpitations.  He believes his blood pressure has been stable.  Since March he has not had any red meat and this has assisted him with a 6 pound weight loss.  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  He also was recently diagnosed as having Giardia as a contributor to his diarrhea.  Most recently he is on Flagyl 500 g twice a day and is been on a steroid taper, currently taking prednisone 8 mg daily.  He has been on amlodipine 2.5 mg and atenolol 25 g twice a day, which has been helpful for his blood pressure, palpitations, as well as documented systolic LAD muscle bridging.  He presents for one-year evaluation.  Past Medical History  Diagnosis Date  . H/O viral myocarditis     25 years ago  . GERD (gastroesophageal reflux disease)   . Arthritis   . Digestive problems     on Prednisone for this  . Hypertension     states he's not had issues recently with this.  . Hyperlipidemia   . Polymyalgia rheumatica     Past Surgical History  Procedure Laterality Date  . Cholecystectomy  2004  . Shoulder open rotator cuff repair  Bilateral 2001  . Apendectomy  1965  . Bone spur Bilateral 1999  . Tonsillectomy    . Appendectomy    . Colonoscopy    . Cardiac catheterization  2001  . Lumbar laminectomy/ decompression with met-rx Right 05/05/2013    Procedure: Right Lumbar three-four Extraforaminal Microdiskectomy with Metrex;  Surgeon: [name], MD;  Location: [facility];  Service: Neurosurgery;  Laterality: Right;  Right Lumbar three-four Extraforaminal Microdiskectomy with Metrex    Allergies  Allergen Reactions  . Lidocaine Hcl Anaphylaxis  . Crestor [Rosuvastatin Calcium]     Body Aches    Current Outpatient Prescriptions  Medication Sig Dispense Refill  . alendronate (FOSAMAX) 70 MG tablet Take 70 mg by mouth every 7 (seven) days. Take with a full glass of water on an empty stomach.    Marland Kitchen amLODipine (NORVASC) 2.5 MG tablet TAKE ONE (1) TABLET EACH DAY 30 tablet 10  . aspirin 81 MG tablet Take 81 mg by mouth. The patient states that he takes every other day    . atenolol (TENORMIN) 25 MG tablet TAKE ONE TABLET BY MOUTH TWICE A DAY 180 tablet 3  . Calcium Citrate-Vitamin D (CITRACAL + D PO) Take 1 tablet by mouth daily.     . metroNIDAZOLE (FLAGYL) 500 MG tablet Take 1 tablet by mouth 2 (two) times daily.    . PredniSONE 2 MG  TBEC Take 8 mg by mouth daily. (Patient taking differently: Take 2.5 mg by mouth daily. ) 120 tablet 5  . ZETIA 10 MG tablet TAKE ONE (1) TABLET EACH DAY 30 tablet 11   No current facility-administered medications for this visit.    History   Social History  . Marital Status: Married    Spouse Name: N/A    Number of Children: N/A  . Years of Education: N/A   Occupational History  . Not on file.   Social History Main Topics  . Smoking status: Former Smoker    Types: Cigarettes    Quit date: 07/10/1971  . Smokeless tobacco: Never Used     Comment: Patient smoked 1/2 pack per week  . Alcohol Use: Yes     Comment: 1-2 glasses of wine with evening meal  . Drug Use: No    . Sexual Activity: Not on file   Other Topics Concern  . 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 He is married has 2 children. There is no tobacco use. He stays active. There is no alcohol use.  He is still working but had a significantly reduced pace than he had previously.  Family history is notable that both parents are deceased.  Mother died of old age.  Father died but had a history of mitral valve prolapse.  He has 2 brothers and one sister who are  alive and well.   ROS General: Negative; No fevers, chills, or night sweats;  HEENT: Negative; No changes in vision or hearing, sinus congestion, difficulty swallowing Pulmonary: Negative; No cough, wheezing, shortness of breath, hemoptysis Cardiovascular: See history of present illness GI: Recently diagnosed with Giardia which may have accounted for his previous GI issues. GU: Negative; No dysuria, hematuria, or difficulty voiding Musculoskeletal: Negative; no myalgias, joint pain, or weakness Hematologic/Oncology: Negative; no easy bruising, bleeding Endocrine: Negative; no heat/cold intolerance; no diabetes Neuro: Negative; no changes in balance, headaches Skin: Negative; No rashes or skin lesions Psychiatric: Negative; No behavioral problems, depression Sleep: Negative; No snoring, daytime sleepiness, hypersomnolence, bruxism, restless legs, hypnogognic hallucinations, no cataplexy Other comprehensive 14 point system review is negative.  PE BP 110/66 mmHg  Pulse 74  Ht '5\' 9"'  (1.753 m)  Wt 187 lb 1.6 oz (84.868 kg)  BMI 27.62 kg/m2  General: Alert, oriented, no distress.  Skin: normal turgor, no rashes HEENT: Normocephalic, atraumatic. Pupils round and reactive; sclera anicteric;no lid lag.  Nose without nasal septal hypertrophy Mouth/Parynx benign; Mallinpatti scale 2 Neck: No JVD, no carotid bruits with normal carotid upstroke Lungs: clear to ausculatation and percussion; no wheezing or  rales Chest wall: Nontender to palpation Heart: RRR, s1 s2 normal 1/6 systolic murmur, no S3 gallop.  No diastolic murmur.  No rubs thrills or heaves. Abdomen: soft, nontender; no hepatosplenomehaly, BS+; abdominal aorta nontender and not dilated by palpation. Back: No CVA tenderness Pulses 2+ Extremities: no clubbing cyanosis or edema, Homan's sign negative  Neurologic: grossly nonfocal; cranial nerves intact Psychologic: normal affect and mood.  ECG (independently read by me): Normal sinus rhythm at 74 bpm.  Mild RV conduction delay.  QTc interval 461 ms.  December 2014 ECG: Sinus rhythm at 57 beats per minute. No ectopy. Normal intervals.  LABS:  BMET    Component Value Date/Time   NA 136 04/30/2013 1332   K 3.9 04/30/2013 1332   CL 100 04/30/2013 1332   CO2 29 04/30/2013 1332   GLUCOSE 100*  04/30/2013 1332   BUN 18 04/30/2013 1332   CREATININE 0.97 04/30/2013 1332   CALCIUM 8.6 04/30/2013 1332   GFRNONAA 85* 04/30/2013 1332   GFRAA >90 04/30/2013 1332     Hepatic Function Panel     Component Value Date/Time   PROT 5.9* 09/16/2012 1621   ALBUMIN 3.9 09/16/2012 1621   AST 30 09/16/2012 1621   ALT 35 09/16/2012 1621   ALKPHOS 48 09/16/2012 1621   BILITOT 1.0 09/16/2012 1621   BILIDIR 0.1 09/16/2012 1621   IBILI 0.9 09/16/2012 1621     CBC    Component Value Date/Time   WBC 6.3 04/30/2013 1332   RBC 4.59 04/30/2013 1332   HGB 17.7* 04/30/2013 1332   HCT 48.8 04/30/2013 1332   PLT 213 04/30/2013 1332   MCV 106.3* 04/30/2013 1332   MCH 38.6* 04/30/2013 1332   MCHC 36.3* 04/30/2013 1332   RDW 12.3 04/30/2013 1332   LYMPHSABS 1.5 11/13/2012 0940   MONOABS 0.6 11/13/2012 0940   EOSABS 0.0 11/13/2012 0940   BASOSABS 0.0 11/13/2012 0940     BNP No results found for: PROBNP  Lipid Panel  No results found for: CHOL   RADIOLOGY: No results found.    ASSESSMENT AND PLAN:  Mr. Thelonious Kauffmann is now 68 years old and continues to do well 26 years  after he developed a viral myocarditis associated with an ejection fraction of 25%. His last echo Doppler study in October 2011 showed an ejection fraction greater than 55%. He did have mild TR.  He is not having any palpitations on his current dose of atenolol 25 mg twice a day.  He has a history of mitral valve prolapse involving the anterior mitral valve leaflet which was seen on prior echo Doppler studies but was not commented on on his last echo study.  His father also had mitral valve prolapse and is deceased.  He is not having any chest pain, and has done well with amlodipine 2.5 mg.  In the past.  He was noted have significant LAD systolic bridging.  His blood pressure is stable on current therapy.  He now is on Zetia for hyperlipidemia.  He also is on Flagyl 500 mg twice a day, and a tapering dose of prednisone for his recent diagnosis of Giardia.  He has not had red meat since March 2015 and he has felt well from a GI perspective and with weight loss since this.  His ECG shows a mild RV conduction delay without  right bundle branch block.  He has not had laboratory checked over the year.  I am recommending conference of blood work be obtained including a CBC, chemistry profile, TSH, lipid panel and since he has been on steroids also check hemoglobin A1c.  He will have his blood work done at Dr. Josue Hector office for convenience in [place].  I will last that.  As long as he is stable, I will see him in one year for cardiology reevaluation or sooner if problems arise.  Time spent: 25 minutes  Troy Sine, MD, [facility]  04/14/2014 2:24 PM

## 2014-04-23 DIAGNOSIS — E782 Mixed hyperlipidemia: Secondary | ICD-10-CM | POA: Diagnosis not present

## 2014-04-23 DIAGNOSIS — I10 Essential (primary) hypertension: Secondary | ICD-10-CM | POA: Diagnosis not present

## 2014-04-23 DIAGNOSIS — Z79899 Other long term (current) drug therapy: Secondary | ICD-10-CM | POA: Diagnosis not present

## 2014-04-26 DIAGNOSIS — R945 Abnormal results of liver function studies: Secondary | ICD-10-CM | POA: Diagnosis not present

## 2014-04-26 DIAGNOSIS — E782 Mixed hyperlipidemia: Secondary | ICD-10-CM | POA: Diagnosis not present

## 2014-04-26 DIAGNOSIS — R110 Nausea: Secondary | ICD-10-CM | POA: Diagnosis not present

## 2014-05-04 DIAGNOSIS — R7989 Other specified abnormal findings of blood chemistry: Secondary | ICD-10-CM | POA: Diagnosis not present

## 2014-05-18 ENCOUNTER — Other Ambulatory Visit (HOSPITAL_COMMUNITY): Payer: Self-pay | Admitting: Rheumatology

## 2014-05-18 DIAGNOSIS — M810 Age-related osteoporosis without current pathological fracture: Secondary | ICD-10-CM

## 2014-05-18 DIAGNOSIS — Z7952 Long term (current) use of systemic steroids: Secondary | ICD-10-CM

## 2014-05-21 ENCOUNTER — Ambulatory Visit (HOSPITAL_COMMUNITY)
Admission: RE | Admit: 2014-05-21 | Discharge: 2014-05-21 | Disposition: A | Discharge status: Home / Self Care | Payer: Medicare Other | Source: Ambulatory Visit | Attending: Rheumatology | Admitting: Rheumatology

## 2014-05-21 DIAGNOSIS — Z7952 Long term (current) use of systemic steroids: Secondary | ICD-10-CM | POA: Diagnosis not present

## 2014-05-21 DIAGNOSIS — M810 Age-related osteoporosis without current pathological fracture: Secondary | ICD-10-CM | POA: Diagnosis not present

## 2014-05-21 DIAGNOSIS — Z13820 Encounter for screening for osteoporosis: Secondary | ICD-10-CM | POA: Insufficient documentation

## 2014-06-27 ENCOUNTER — Other Ambulatory Visit: Payer: Self-pay | Admitting: Cardiovascular Disease

## 2014-06-28 NOTE — Telephone Encounter (Signed)
Rx(s) sent to pharmacy electronically.  

## 2014-07-14 ENCOUNTER — Other Ambulatory Visit: Payer: Self-pay | Admitting: Cardiovascular Disease

## 2014-07-14 NOTE — Telephone Encounter (Signed)
Rx has been sent to the pharmacy electronically. ° °

## 2014-07-15 DIAGNOSIS — M810 Age-related osteoporosis without current pathological fracture: Secondary | ICD-10-CM | POA: Diagnosis not present

## 2014-07-15 DIAGNOSIS — M069 Rheumatoid arthritis, unspecified: Secondary | ICD-10-CM | POA: Diagnosis not present

## 2014-07-15 DIAGNOSIS — Z79899 Other long term (current) drug therapy: Secondary | ICD-10-CM | POA: Diagnosis not present

## 2014-07-15 DIAGNOSIS — M545 Low back pain: Secondary | ICD-10-CM | POA: Diagnosis not present

## 2014-07-23 DIAGNOSIS — M5416 Radiculopathy, lumbar region: Secondary | ICD-10-CM | POA: Diagnosis not present

## 2014-07-23 DIAGNOSIS — M47816 Spondylosis without myelopathy or radiculopathy, lumbar region: Secondary | ICD-10-CM | POA: Diagnosis not present

## 2014-09-13 ENCOUNTER — Ambulatory Visit (INDEPENDENT_AMBULATORY_CARE_PROVIDER_SITE_OTHER): Payer: Medicare Other | Admitting: Internal Medicine

## 2014-09-13 ENCOUNTER — Encounter (INDEPENDENT_AMBULATORY_CARE_PROVIDER_SITE_OTHER): Payer: Self-pay | Admitting: Internal Medicine

## 2014-09-13 VITALS — BP 118/72 | HR 72 | Temp 99.1°F | Ht 69.0 in | Wt 187.4 lb

## 2014-09-13 DIAGNOSIS — R197 Diarrhea, unspecified: Secondary | ICD-10-CM

## 2014-09-13 MED ORDER — METRONIDAZOLE 500 MG PO TABS: 500.0000 mg | ORAL_TABLET | Freq: Two times a day (BID) | ORAL | Status: DC

## 2014-09-13 NOTE — Progress Notes (Signed)
Subjective:    [name], male    DOB: 1945/12/13, 69 y.o.   MRN: [id number]  HPI Here today for f/u of  Chronic diarrhea. Presently taking Prednisone 61m daily for this and his arthritis. He is followed by Dr. TCharlestine Nightfor his osteoarthritis. He tells me he is not doing good.  He has some nausea.   He has stopped red meats due to testing positive for Alpha Gal. [facility] He had positive stool studies for heavey tape cysts and adults, 4+ protozoa, heavy Giardi, heavy round worms..Marland KitchenHe was placed on Flagyl and symptoms became better. After taking Flagyl he started  having one-two stools a day and were solid. Stools normal up until after Christmas.  He says he is having 3-4 stools a day now.  He is taking Lomitil twice a day.    Review of Systems Past Medical History  Diagnosis Date  . H/O viral myocarditis     25 years ago  . GERD (gastroesophageal reflux disease)   . Arthritis   . Digestive problems     on Prednisone for this  . Hypertension     states he's not had issues recently with this.  . Hyperlipidemia   . Polymyalgia rheumatica     Past Surgical History  Procedure Laterality Date  . Cholecystectomy  2004  . Shoulder open rotator cuff repair Bilateral 2001  . Apendectomy  1965  . Bone spur Bilateral 1999  . Tonsillectomy    . Appendectomy    . Colonoscopy    . Cardiac catheterization  2001  . Lumbar laminectomy/ decompression with met-rx Right 05/05/2013    Procedure: Right Lumbar three-four Extraforaminal Microdiskectomy with Metrex;  Surgeon: HKristeen Miss MD;  [facility];  Service: Neurosurgery;  Laterality: Right;  Right Lumbar three-four Extraforaminal Microdiskectomy with Metrex    Allergies  Allergen Reactions  . Lidocaine Hcl Anaphylaxis  . Crestor [Rosuvastatin Calcium]     Body Aches    Current Outpatient Prescriptions on File Prior to Visit  Medication Sig Dispense Refill  . alendronate  (FOSAMAX) 70 MG tablet Take 70 mg by mouth every 7 (seven) days. Take with a full glass of water on an empty stomach.    .Marland KitchenamLODipine (NORVASC) 2.5 MG tablet TAKE ONE (1) TABLET EACH DAY 30 tablet 10  . aspirin 81 MG tablet Take 81 mg by mouth. The patient states that he takes every other day    . atenolol (TENORMIN) 25 MG tablet TAKE ONE TABLET BY MOUTH TWICE A DAY 180 tablet 1  . Calcium Citrate-Vitamin D (CITRACAL + D PO) Take 1 tablet by mouth daily.     . PredniSONE 2 MG TBEC Take 8 mg by mouth daily. (Patient taking differently: Take 5 mg by mouth daily. ) 120 tablet 5  . ZETIA 10 MG tablet TAKE ONE (1) TABLET EACH DAY 30 tablet 10   No current facility-administered medications on file prior to visit.  Married. Two children in good health.  Is a cChief Strategy Officer       Objective:   Physical Exam  Blood pressure 118/72, pulse 72, temperature 99.1 F (37.3 C), height '5\' 9"'  (1.753 m), weight 187 lb 6.4 oz (85.004 kg).  Alert and oriented. Skin warm and dry. Oral mucosa is moist.   . Sclera anicteric, conjunctivae is pink. Thyroid not enlarged. No cervical lymphadenopathy. Lungs clear. Heart regular rate and rhythm.  Abdomen is soft. Bowel sounds are  positive. No hepatomegaly. No abdominal masses felt. No tenderness.  No edema to lower extremities.         Assessment & Plan:  Chronic diarrhea. I discussed with Dr. Laural Golden. Patient had positive stool studies at [facility] and responded to Flagyl.  Will get a GI pathogen and start him on Flagyl BID x 10 days after the stool studies are obtained.

## 2014-09-13 NOTE — Patient Instructions (Signed)
GI pathogen   

## 2014-09-14 DIAGNOSIS — R197 Diarrhea, unspecified: Secondary | ICD-10-CM | POA: Diagnosis not present

## 2014-09-15 LAB — GASTROINTESTINAL PATHOGEN PANEL PCR
C. difficile Tox A/B, PCR: NEGATIVE
Campylobacter, PCR: NEGATIVE
Cryptosporidium, PCR: NEGATIVE
E coli (ETEC) LT/ST PCR: NEGATIVE
E coli (STEC) stx1/stx2, PCR: NEGATIVE
E coli 0157, PCR: NEGATIVE
Giardia lamblia, PCR: NEGATIVE
Norovirus, PCR: NEGATIVE
Rotavirus A, PCR: NEGATIVE
Salmonella, PCR: NEGATIVE
Shigella, PCR: NEGATIVE

## 2014-09-28 ENCOUNTER — Other Ambulatory Visit (INDEPENDENT_AMBULATORY_CARE_PROVIDER_SITE_OTHER): Payer: Self-pay | Admitting: Internal Medicine

## 2014-10-01 DIAGNOSIS — M5416 Radiculopathy, lumbar region: Secondary | ICD-10-CM | POA: Diagnosis not present

## 2014-10-01 DIAGNOSIS — M47816 Spondylosis without myelopathy or radiculopathy, lumbar region: Secondary | ICD-10-CM | POA: Diagnosis not present

## 2014-11-01 ENCOUNTER — Other Ambulatory Visit (INDEPENDENT_AMBULATORY_CARE_PROVIDER_SITE_OTHER): Payer: Self-pay | Admitting: Internal Medicine

## 2014-11-03 DIAGNOSIS — L570 Actinic keratosis: Secondary | ICD-10-CM | POA: Diagnosis not present

## 2014-11-03 DIAGNOSIS — X32XXXD Exposure to sunlight, subsequent encounter: Secondary | ICD-10-CM | POA: Diagnosis not present

## 2014-11-03 DIAGNOSIS — Z125 Encounter for screening for malignant neoplasm of prostate: Secondary | ICD-10-CM | POA: Diagnosis not present

## 2014-11-03 DIAGNOSIS — D225 Melanocytic nevi of trunk: Secondary | ICD-10-CM | POA: Diagnosis not present

## 2014-11-03 DIAGNOSIS — E782 Mixed hyperlipidemia: Secondary | ICD-10-CM | POA: Diagnosis not present

## 2014-11-05 DIAGNOSIS — E785 Hyperlipidemia, unspecified: Secondary | ICD-10-CM | POA: Diagnosis not present

## 2014-11-05 DIAGNOSIS — I10 Essential (primary) hypertension: Secondary | ICD-10-CM | POA: Diagnosis not present

## 2014-11-08 ENCOUNTER — Other Ambulatory Visit: Payer: Self-pay

## 2014-11-17 DIAGNOSIS — M545 Low back pain: Secondary | ICD-10-CM | POA: Diagnosis not present

## 2014-11-17 DIAGNOSIS — Z79899 Other long term (current) drug therapy: Secondary | ICD-10-CM | POA: Diagnosis not present

## 2014-11-17 DIAGNOSIS — M069 Rheumatoid arthritis, unspecified: Secondary | ICD-10-CM | POA: Diagnosis not present

## 2014-12-15 DIAGNOSIS — M5416 Radiculopathy, lumbar region: Secondary | ICD-10-CM | POA: Diagnosis not present

## 2014-12-22 ENCOUNTER — Other Ambulatory Visit (HOSPITAL_COMMUNITY): Payer: Self-pay | Admitting: Neurological Surgery

## 2014-12-22 DIAGNOSIS — M5416 Radiculopathy, lumbar region: Secondary | ICD-10-CM

## 2015-01-03 ENCOUNTER — Ambulatory Visit (HOSPITAL_COMMUNITY)
Admission: RE | Admit: 2015-01-03 | Discharge: 2015-01-03 | Disposition: A | Discharge status: Home / Self Care | Payer: Medicare Other | Source: Ambulatory Visit | Attending: Neurological Surgery | Admitting: Neurological Surgery

## 2015-01-03 ENCOUNTER — Other Ambulatory Visit (HOSPITAL_COMMUNITY): Payer: Self-pay | Admitting: Neurological Surgery

## 2015-01-03 DIAGNOSIS — M5416 Radiculopathy, lumbar region: Secondary | ICD-10-CM

## 2015-01-03 DIAGNOSIS — M4806 Spinal stenosis, lumbar region: Secondary | ICD-10-CM | POA: Diagnosis not present

## 2015-01-03 DIAGNOSIS — M5136 Other intervertebral disc degeneration, lumbar region: Secondary | ICD-10-CM | POA: Insufficient documentation

## 2015-01-03 DIAGNOSIS — M5126 Other intervertebral disc displacement, lumbar region: Secondary | ICD-10-CM | POA: Diagnosis not present

## 2015-01-06 DIAGNOSIS — Z6826 Body mass index (BMI) 26.0-26.9, adult: Secondary | ICD-10-CM | POA: Diagnosis not present

## 2015-01-06 DIAGNOSIS — M5126 Other intervertebral disc displacement, lumbar region: Secondary | ICD-10-CM | POA: Diagnosis not present

## 2015-01-06 DIAGNOSIS — M4156 Other secondary scoliosis, lumbar region: Secondary | ICD-10-CM | POA: Diagnosis not present

## 2015-01-07 ENCOUNTER — Other Ambulatory Visit: Payer: Self-pay | Admitting: Neurological Surgery

## 2015-01-18 ENCOUNTER — Other Ambulatory Visit: Payer: Self-pay | Admitting: Cardiovascular Disease

## 2015-01-19 NOTE — Telephone Encounter (Signed)
REFILL 

## 2015-01-24 ENCOUNTER — Encounter (HOSPITAL_COMMUNITY): Payer: Self-pay

## 2015-01-24 ENCOUNTER — Encounter (HOSPITAL_COMMUNITY)
Admission: RE | Admit: 2015-01-24 | Discharge: 2015-01-24 | Disposition: A | Discharge status: Home / Self Care | Payer: Medicare Other | Source: Ambulatory Visit | Attending: Neurological Surgery | Admitting: Neurological Surgery

## 2015-01-24 DIAGNOSIS — Z01812 Encounter for preprocedural laboratory examination: Secondary | ICD-10-CM | POA: Diagnosis not present

## 2015-01-24 DIAGNOSIS — M4156 Other secondary scoliosis, lumbar region: Secondary | ICD-10-CM | POA: Diagnosis not present

## 2015-01-24 DIAGNOSIS — Z0183 Encounter for blood typing: Secondary | ICD-10-CM | POA: Diagnosis not present

## 2015-01-24 DIAGNOSIS — M5126 Other intervertebral disc displacement, lumbar region: Secondary | ICD-10-CM | POA: Diagnosis not present

## 2015-01-24 HISTORY — DX: Concussion with loss of consciousness of unspecified duration, initial encounter: S06.0X9A

## 2015-01-24 HISTORY — DX: Cardiac arrhythmia, unspecified: I49.9

## 2015-01-24 HISTORY — DX: Concussion with loss of consciousness status unknown, initial encounter: S06.0XAA

## 2015-01-24 HISTORY — DX: Nonrheumatic mitral (valve) prolapse: I34.1

## 2015-01-24 LAB — SURGICAL PCR SCREEN
MRSA, PCR: NEGATIVE
Staphylococcus aureus: NEGATIVE

## 2015-01-24 LAB — BASIC METABOLIC PANEL
Anion gap: 9 (ref 5–15)
BUN: 9 mg/dL (ref 6–20)
CO2: 25 mmol/L (ref 22–32)
Calcium: 9.4 mg/dL (ref 8.9–10.3)
Chloride: 106 mmol/L (ref 101–111)
Creatinine, Ser: 0.87 mg/dL (ref 0.61–1.24)
GFR calc Af Amer: >60 mL/min (ref >60)
GFR calc non Af Amer: >60 mL/min (ref >60)
Glucose, Bld: 110 mg/dL — ABNORMAL HIGH (ref 65–99)
Potassium: 4.2 mmol/L (ref 3.5–5.1)
Sodium: 140 mmol/L (ref 135–145)

## 2015-01-24 LAB — CBC
HCT: 48.6 % (ref 39.0–52.0)
Hemoglobin: 17.3 g/dL — ABNORMAL HIGH (ref 13.0–17.0)
MCH: 37.6 pg — ABNORMAL HIGH (ref 26.0–34.0)
MCHC: 35.6 g/dL (ref 30.0–36.0)
MCV: 105.7 fL — ABNORMAL HIGH (ref 78.0–100.0)
Platelets: 192 10*3/uL (ref 150–400)
RBC: 4.6 MIL/uL (ref 4.22–5.81)
RDW: 12.6 % (ref 11.5–15.5)
WBC: 10 10*3/uL (ref 4.0–10.5)

## 2015-01-24 LAB — TYPE AND SCREEN
ABO/RH(D): A POS
Antibody Screen: NEGATIVE

## 2015-01-24 LAB — ABO/RH: ABO/RH(D): A POS

## 2015-01-24 NOTE — Pre-Procedure Instructions (Signed)
    [place]  01/24/2015    Your procedure is scheduled on Tuesday, September 20.  Report to [facility] Admitting at 5:30 A.M.                Surgery is scheduled for 7:30 A M.   Call this number if you have problems the morning of surgery:[phone]                 For any other questions, please call [phone], Monday - Friday 8 AM - 4 PM.   Remember:  Do not eat food or drink liquids after midnight Monday, September 19.  Take these medicines the morning of surgery with A SIP OF WATER :amLODipine (NORVASC), atenolol (TENORMIN), cetirizine (ZYRTEC).                          Stop as instructed by Dr Ellene Route.  Do not take any vitamins, Herbal medications or NSAIDS (Ibuprofen, Naproxen)   Do not wear jewelry, make-up or nail polish.   Do not wear lotions, powders, or perfumes.     Men may shave face and neck.   Do not bring valuables to the hospital.   [facility] is not responsible for any belongings or valuables.  Contacts, dentures or bridgework may not be worn into surgery.  Leave your suitcase in the car.  After surgery it may be brought to your room.  For patients admitted to the hospital, discharge time will be determined by your treatment team.  Special instructions:  Review  [facility] - Preparing For Surgery.  Please read over the following fact sheets that you were given. Pain Booklet, Coughing and Deep Breathing, Blood Transfusion Information and Surgical Site Infection Prevention

## 2015-01-24 NOTE — Progress Notes (Addendum)
Mr Gintz's PCP is Dr Delphina Cahill, cardiologist Dr Shelva Majestic.  Patient denies pain or shortness of breath. Patient has history of myocarditis 25 years ago, EF was 25% at that time, last ECHO was in 2011, EF was >55%. Mr Rudzinski reported that he takes Norvasc and Tenormin for irregular heart beat, he denies HTN   Patient sees Dr Claiborne Billings yearly.

## 2015-01-31 MED ORDER — CEFAZOLIN SODIUM-DEXTROSE 2-3 GM-% IV SOLR
2.0000 g | INTRAVENOUS | Status: AC
  Administered 2015-02-01 (×2): 2 g via INTRAVENOUS
  Filled 2015-01-31: qty 50

## 2015-02-01 ENCOUNTER — Inpatient Hospital Stay (HOSPITAL_COMMUNITY): Payer: Medicare Other

## 2015-02-01 ENCOUNTER — Inpatient Hospital Stay (HOSPITAL_COMMUNITY): Payer: Medicare Other | Admitting: Anesthesiology

## 2015-02-01 ENCOUNTER — Inpatient Hospital Stay (HOSPITAL_COMMUNITY)
Admission: AD | Admit: 2015-02-01 | Discharge: 2015-02-04 | DRG: 458 | Disposition: A | Discharge status: Home / Self Care | Payer: Medicare Other | Source: Ambulatory Visit | Attending: Neurological Surgery | Admitting: Neurological Surgery

## 2015-02-01 ENCOUNTER — Encounter (HOSPITAL_COMMUNITY)
Admission: AD | Disposition: A | Discharge status: Home / Self Care | Payer: Self-pay | Source: Ambulatory Visit | Attending: Neurological Surgery

## 2015-02-01 ENCOUNTER — Encounter (HOSPITAL_COMMUNITY): Payer: Self-pay | Admitting: *Deleted

## 2015-02-01 ENCOUNTER — Encounter (HOSPITAL_COMMUNITY)
Admission: AD | Disposition: A | Discharge status: Home / Self Care | Payer: Medicare Other | Source: Ambulatory Visit | Attending: Neurological Surgery

## 2015-02-01 DIAGNOSIS — Z87820 Personal history of traumatic brain injury: Secondary | ICD-10-CM

## 2015-02-01 DIAGNOSIS — Z7982 Long term (current) use of aspirin: Secondary | ICD-10-CM

## 2015-02-01 DIAGNOSIS — Z87891 Personal history of nicotine dependence: Secondary | ICD-10-CM | POA: Diagnosis not present

## 2015-02-01 DIAGNOSIS — M353 Polymyalgia rheumatica: Secondary | ICD-10-CM | POA: Diagnosis present

## 2015-02-01 DIAGNOSIS — M4186 Other forms of scoliosis, lumbar region: Secondary | ICD-10-CM | POA: Diagnosis not present

## 2015-02-01 DIAGNOSIS — Z791 Long term (current) use of non-steroidal anti-inflammatories (NSAID): Secondary | ICD-10-CM

## 2015-02-01 DIAGNOSIS — E785 Hyperlipidemia, unspecified: Secondary | ICD-10-CM | POA: Diagnosis present

## 2015-02-01 DIAGNOSIS — M4726 Other spondylosis with radiculopathy, lumbar region: Secondary | ICD-10-CM | POA: Diagnosis not present

## 2015-02-01 DIAGNOSIS — M4806 Spinal stenosis, lumbar region: Secondary | ICD-10-CM | POA: Diagnosis not present

## 2015-02-01 DIAGNOSIS — Z7952 Long term (current) use of systemic steroids: Secondary | ICD-10-CM | POA: Diagnosis not present

## 2015-02-01 DIAGNOSIS — M5126 Other intervertebral disc displacement, lumbar region: Secondary | ICD-10-CM | POA: Diagnosis not present

## 2015-02-01 DIAGNOSIS — K219 Gastro-esophageal reflux disease without esophagitis: Secondary | ICD-10-CM | POA: Diagnosis present

## 2015-02-01 DIAGNOSIS — M4326 Fusion of spine, lumbar region: Secondary | ICD-10-CM

## 2015-02-01 DIAGNOSIS — M4156 Other secondary scoliosis, lumbar region: Secondary | ICD-10-CM | POA: Diagnosis not present

## 2015-02-01 DIAGNOSIS — Z79899 Other long term (current) drug therapy: Secondary | ICD-10-CM | POA: Diagnosis not present

## 2015-02-01 DIAGNOSIS — M1990 Unspecified osteoarthritis, unspecified site: Secondary | ICD-10-CM | POA: Diagnosis not present

## 2015-02-01 DIAGNOSIS — M48061 Spinal stenosis, lumbar region without neurogenic claudication: Secondary | ICD-10-CM | POA: Diagnosis present

## 2015-02-01 DIAGNOSIS — M5416 Radiculopathy, lumbar region: Secondary | ICD-10-CM | POA: Diagnosis not present

## 2015-02-01 DIAGNOSIS — M4126 Other idiopathic scoliosis, lumbar region: Secondary | ICD-10-CM | POA: Diagnosis not present

## 2015-02-01 DIAGNOSIS — M79604 Pain in right leg: Secondary | ICD-10-CM | POA: Diagnosis not present

## 2015-02-01 SURGERY — POSTERIOR LUMBAR FUSION 3 LEVEL
Anesthesia: General | Site: Back

## 2015-02-01 MED ORDER — MENTHOL 3 MG MT LOZG: 1.0000 | LOZENGE | OROMUCOSAL | Status: DC | PRN

## 2015-02-01 MED ORDER — DEXAMETHASONE SODIUM PHOSPHATE 10 MG/ML IJ SOLN
INTRAMUSCULAR | Status: AC
  Filled 2015-02-01: qty 1

## 2015-02-01 MED ORDER — GLYCOPYRROLATE 0.2 MG/ML IJ SOLN
INTRAMUSCULAR | Status: DC | PRN
  Administered 2015-02-01: .8 mg via INTRAVENOUS

## 2015-02-01 MED ORDER — KETOROLAC TROMETHAMINE 15 MG/ML IJ SOLN
INTRAMUSCULAR | Status: AC
  Administered 2015-02-01: 15 mg
  Filled 2015-02-01: qty 1

## 2015-02-01 MED ORDER — ACETAMINOPHEN 10 MG/ML IV SOLN
INTRAVENOUS | Status: AC
  Administered 2015-02-01: 1000 mg via INTRAVENOUS
  Filled 2015-02-01: qty 100

## 2015-02-01 MED ORDER — STERILE WATER FOR INJECTION IJ SOLN
INTRAMUSCULAR | Status: AC
  Filled 2015-02-01: qty 20

## 2015-02-01 MED ORDER — SODIUM CHLORIDE 0.9 % IJ SOLN
3.0000 mL | Freq: Two times a day (BID) | INTRAMUSCULAR | Status: DC
  Administered 2015-02-02 – 2015-02-03 (×3): 3 mL via INTRAVENOUS

## 2015-02-01 MED ORDER — MEPERIDINE HCL 25 MG/ML IJ SOLN: 6.2500 mg | INTRAMUSCULAR | Status: DC | PRN

## 2015-02-01 MED ORDER — THROMBIN 5000 UNITS EX SOLR
OROMUCOSAL | Status: DC | PRN
  Administered 2015-02-01: 10 mL via TOPICAL

## 2015-02-01 MED ORDER — NEOSTIGMINE METHYLSULFATE 10 MG/10ML IV SOLN
INTRAVENOUS | Status: AC
  Filled 2015-02-01: qty 1

## 2015-02-01 MED ORDER — NEOSTIGMINE METHYLSULFATE 10 MG/10ML IV SOLN
INTRAVENOUS | Status: DC | PRN
  Administered 2015-02-01: 4 mg via INTRAVENOUS

## 2015-02-01 MED ORDER — ONDANSETRON HCL 4 MG/2ML IJ SOLN
INTRAMUSCULAR | Status: AC
  Filled 2015-02-01: qty 2

## 2015-02-01 MED ORDER — MIDAZOLAM HCL 5 MG/5ML IJ SOLN
INTRAMUSCULAR | Status: DC | PRN
  Administered 2015-02-01: 2 mg via INTRAVENOUS

## 2015-02-01 MED ORDER — LORATADINE 10 MG PO TABS
10.0000 mg | ORAL_TABLET | Freq: Every day | ORAL | Status: DC
  Administered 2015-02-02 – 2015-02-04 (×3): 10 mg via ORAL
  Filled 2015-02-01 (×3): qty 1

## 2015-02-01 MED ORDER — PANTOPRAZOLE SODIUM 40 MG PO TBEC
40.0000 mg | DELAYED_RELEASE_TABLET | Freq: Every day | ORAL | Status: DC
  Administered 2015-02-01 – 2015-02-04 (×4): 40 mg via ORAL
  Filled 2015-02-01 (×4): qty 1

## 2015-02-01 MED ORDER — FLUTICASONE PROPIONATE 50 MCG/ACT NA SUSP
1.0000 | Freq: Every day | NASAL | Status: DC
  Administered 2015-02-01 – 2015-02-04 (×4): 1 via NASAL
  Filled 2015-02-01: qty 16

## 2015-02-01 MED ORDER — SODIUM CHLORIDE 0.9 % IR SOLN
Status: DC | PRN
  Administered 2015-02-01: 500 mL

## 2015-02-01 MED ORDER — METHOCARBAMOL 500 MG PO TABS
500.0000 mg | ORAL_TABLET | Freq: Four times a day (QID) | ORAL | Status: DC | PRN
  Administered 2015-02-02 – 2015-02-03 (×3): 500 mg via ORAL
  Filled 2015-02-01 (×5): qty 1

## 2015-02-01 MED ORDER — THROMBIN 20000 UNITS EX SOLR
CUTANEOUS | Status: DC | PRN
  Administered 2015-02-01: 20 mL via TOPICAL

## 2015-02-01 MED ORDER — ATENOLOL 25 MG PO TABS
25.0000 mg | ORAL_TABLET | Freq: Two times a day (BID) | ORAL | Status: DC
  Administered 2015-02-02 – 2015-02-04 (×5): 25 mg via ORAL
  Filled 2015-02-01 (×5): qty 1

## 2015-02-01 MED ORDER — SODIUM CHLORIDE 0.9 % IV SOLN
INTRAVENOUS | Status: DC
Start: 2015-02-01 — End: 2015-02-04
  Administered 2015-02-01 – 2015-02-02 (×2): via INTRAVENOUS

## 2015-02-01 MED ORDER — OXYCODONE-ACETAMINOPHEN 5-325 MG PO TABS
ORAL_TABLET | ORAL | Status: AC
  Filled 2015-02-01: qty 2

## 2015-02-01 MED ORDER — ONDANSETRON HCL 4 MG/2ML IJ SOLN: 4.0000 mg | INTRAMUSCULAR | Status: DC | PRN

## 2015-02-01 MED ORDER — DIPHENOXYLATE-ATROPINE 2.5-0.025 MG PO TABS: 1.0000 | ORAL_TABLET | Freq: Four times a day (QID) | ORAL | Status: DC | PRN

## 2015-02-01 MED ORDER — PREDNISONE 5 MG PO TABS: 5.0000 mg | ORAL_TABLET | Freq: Every evening | ORAL | Status: DC

## 2015-02-01 MED ORDER — HYDROMORPHONE HCL 1 MG/ML IJ SOLN
0.2500 mg | INTRAMUSCULAR | Status: DC | PRN
Start: 2015-02-01 — End: 2015-02-01
  Administered 2015-02-01 (×4): 0.5 mg via INTRAVENOUS

## 2015-02-01 MED ORDER — HYDROMORPHONE HCL 1 MG/ML IJ SOLN
INTRAMUSCULAR | Status: AC
  Filled 2015-02-01: qty 1

## 2015-02-01 MED ORDER — DIPHENHYDRAMINE HCL 25 MG PO CAPS
25.0000 mg | ORAL_CAPSULE | Freq: Four times a day (QID) | ORAL | Status: DC | PRN
  Administered 2015-02-01 – 2015-02-03 (×2): 25 mg via ORAL
  Filled 2015-02-01 (×2): qty 1

## 2015-02-01 MED ORDER — PROPOFOL 10 MG/ML IV BOLUS
INTRAVENOUS | Status: AC
  Filled 2015-02-01: qty 20

## 2015-02-01 MED ORDER — 0.9 % SODIUM CHLORIDE (POUR BTL) OPTIME
TOPICAL | Status: DC | PRN
  Administered 2015-02-01: 1000 mL

## 2015-02-01 MED ORDER — VECURONIUM BROMIDE 10 MG IV SOLR
INTRAVENOUS | Status: AC
  Filled 2015-02-01: qty 20

## 2015-02-01 MED ORDER — ARTIFICIAL TEARS OP OINT
TOPICAL_OINTMENT | OPHTHALMIC | Status: AC
  Filled 2015-02-01: qty 3.5

## 2015-02-01 MED ORDER — KETOROLAC TROMETHAMINE 15 MG/ML IJ SOLN
15.0000 mg | Freq: Four times a day (QID) | INTRAMUSCULAR | Status: AC
  Administered 2015-02-01 – 2015-02-02 (×4): 15 mg via INTRAVENOUS
  Filled 2015-02-01 (×5): qty 1

## 2015-02-01 MED ORDER — SODIUM CHLORIDE 0.9 % IJ SOLN: 3.0000 mL | INTRAMUSCULAR | Status: DC | PRN

## 2015-02-01 MED ORDER — AMLODIPINE BESYLATE 2.5 MG PO TABS
2.5000 mg | ORAL_TABLET | Freq: Every day | ORAL | Status: DC
  Administered 2015-02-02 – 2015-02-04 (×3): 2.5 mg via ORAL
  Filled 2015-02-01 (×3): qty 1

## 2015-02-01 MED ORDER — CEFAZOLIN SODIUM 1-5 GM-% IV SOLN
1.0000 g | Freq: Three times a day (TID) | INTRAVENOUS | Status: AC
  Administered 2015-02-01 – 2015-02-02 (×2): 1 g via INTRAVENOUS
  Filled 2015-02-01 (×2): qty 50

## 2015-02-01 MED ORDER — ACETAMINOPHEN 10 MG/ML IV SOLN
1000.0000 mg | Freq: Once | INTRAVENOUS | Status: AC
  Administered 2015-02-01: 1000 mg via INTRAVENOUS

## 2015-02-01 MED ORDER — ROCURONIUM BROMIDE 100 MG/10ML IV SOLN
INTRAVENOUS | Status: DC | PRN
  Administered 2015-02-01: 50 mg via INTRAVENOUS
  Administered 2015-02-01: 10 mg via INTRAVENOUS
  Administered 2015-02-01 (×2): 20 mg via INTRAVENOUS

## 2015-02-01 MED ORDER — PROPOFOL 10 MG/ML IV BOLUS
INTRAVENOUS | Status: DC | PRN
  Administered 2015-02-01: 200 mg via INTRAVENOUS

## 2015-02-01 MED ORDER — ZOLPIDEM TARTRATE 5 MG PO TABS
5.0000 mg | ORAL_TABLET | Freq: Every evening | ORAL | Status: DC | PRN
  Administered 2015-02-02 – 2015-02-03 (×3): 5 mg via ORAL
  Filled 2015-02-01 (×3): qty 1

## 2015-02-01 MED ORDER — DEXAMETHASONE SODIUM PHOSPHATE 4 MG/ML IJ SOLN
4.0000 mg | Freq: Two times a day (BID) | INTRAMUSCULAR | Status: DC
  Administered 2015-02-01 – 2015-02-04 (×6): 4 mg via INTRAVENOUS
  Filled 2015-02-01 (×6): qty 1

## 2015-02-01 MED ORDER — BISACODYL 10 MG RE SUPP: 10.0000 mg | Freq: Every day | RECTAL | Status: DC | PRN

## 2015-02-01 MED ORDER — PHENYLEPHRINE HCL 10 MG/ML IJ SOLN
10.0000 mg | INTRAMUSCULAR | Status: DC | PRN
  Administered 2015-02-01: 20 ug/min via INTRAVENOUS

## 2015-02-01 MED ORDER — HYDROMORPHONE HCL 1 MG/ML IJ SOLN
0.5000 mg | INTRAMUSCULAR | Status: DC | PRN
  Administered 2015-02-02 (×2): 0.5 mg via INTRAVENOUS
  Filled 2015-02-01 (×2): qty 1

## 2015-02-01 MED ORDER — ACETAMINOPHEN 10 MG/ML IV SOLN
INTRAVENOUS | Status: AC
  Filled 2015-02-01: qty 100

## 2015-02-01 MED ORDER — OXYCODONE-ACETAMINOPHEN 5-325 MG PO TABS
1.0000 | ORAL_TABLET | ORAL | Status: DC | PRN
  Administered 2015-02-01 (×2): 2 via ORAL
  Filled 2015-02-01: qty 2

## 2015-02-01 MED ORDER — SODIUM CHLORIDE 0.9 % IV SOLN
250.0000 mL | INTRAVENOUS | Status: DC
Start: 2015-02-01 — End: 2015-02-04
  Administered 2015-02-01: 250 mL via INTRAVENOUS

## 2015-02-01 MED ORDER — GLYCOPYRROLATE 0.2 MG/ML IJ SOLN
INTRAMUSCULAR | Status: AC
  Filled 2015-02-01: qty 4

## 2015-02-01 MED ORDER — ONDANSETRON HCL 4 MG/2ML IJ SOLN
INTRAMUSCULAR | Status: DC | PRN
  Administered 2015-02-01: 4 mg via INTRAVENOUS

## 2015-02-01 MED ORDER — ALBUMIN HUMAN 5 % IV SOLN
INTRAVENOUS | Status: DC | PRN
  Administered 2015-02-01: 10:00:00 via INTRAVENOUS

## 2015-02-01 MED ORDER — PHENYLEPHRINE HCL 10 MG/ML IJ SOLN
INTRAMUSCULAR | Status: DC | PRN
  Administered 2015-02-01: 80 ug via INTRAVENOUS

## 2015-02-01 MED ORDER — VECURONIUM BROMIDE 10 MG IV SOLR
INTRAVENOUS | Status: DC | PRN
  Administered 2015-02-01: 4 mg via INTRAVENOUS

## 2015-02-01 MED ORDER — PHENOL 1.4 % MT LIQD: 1.0000 | OROMUCOSAL | Status: DC | PRN

## 2015-02-01 MED ORDER — POLYETHYLENE GLYCOL 3350 17 G PO PACK
17.0000 g | PACK | Freq: Every day | ORAL | Status: DC | PRN
  Administered 2015-02-02: 17 g via ORAL
  Filled 2015-02-01: qty 1

## 2015-02-01 MED ORDER — FENTANYL CITRATE (PF) 250 MCG/5ML IJ SOLN
INTRAMUSCULAR | Status: AC
Start: 2015-02-01 — End: 2015-02-01
  Filled 2015-02-01: qty 5

## 2015-02-01 MED ORDER — ONDANSETRON HCL 4 MG PO TABS: 4.0000 mg | ORAL_TABLET | Freq: Three times a day (TID) | ORAL | Status: DC | PRN

## 2015-02-01 MED ORDER — FENTANYL CITRATE (PF) 100 MCG/2ML IJ SOLN
INTRAMUSCULAR | Status: DC | PRN
  Administered 2015-02-01 (×2): 50 ug via INTRAVENOUS
  Administered 2015-02-01: 100 ug via INTRAVENOUS
  Administered 2015-02-01: 50 ug via INTRAVENOUS

## 2015-02-01 MED ORDER — ARTIFICIAL TEARS OP OINT
TOPICAL_OINTMENT | OPHTHALMIC | Status: DC | PRN
  Administered 2015-02-01: 1 via OPHTHALMIC

## 2015-02-01 MED ORDER — PREDNISONE 5 MG PO TABS: 10.0000 mg | ORAL_TABLET | Freq: Every day | ORAL | Status: DC

## 2015-02-01 MED ORDER — EPHEDRINE SULFATE 50 MG/ML IJ SOLN
INTRAMUSCULAR | Status: AC
  Filled 2015-02-01: qty 1

## 2015-02-01 MED ORDER — DOCUSATE SODIUM 100 MG PO CAPS
100.0000 mg | ORAL_CAPSULE | Freq: Two times a day (BID) | ORAL | Status: DC
  Administered 2015-02-01 – 2015-02-03 (×5): 100 mg via ORAL
  Filled 2015-02-01 (×5): qty 1

## 2015-02-01 MED ORDER — SENNA 8.6 MG PO TABS
1.0000 | ORAL_TABLET | Freq: Two times a day (BID) | ORAL | Status: DC
  Administered 2015-02-01 – 2015-02-03 (×5): 8.6 mg via ORAL
  Filled 2015-02-01 (×5): qty 1

## 2015-02-01 MED ORDER — ALUM & MAG HYDROXIDE-SIMETH 200-200-20 MG/5ML PO SUSP: 30.0000 mL | Freq: Four times a day (QID) | ORAL | Status: DC | PRN

## 2015-02-01 MED ORDER — ACETAMINOPHEN 325 MG PO TABS: 650.0000 mg | ORAL_TABLET | ORAL | Status: DC | PRN

## 2015-02-01 MED ORDER — METHOCARBAMOL 1000 MG/10ML IJ SOLN
500.0000 mg | Freq: Four times a day (QID) | INTRAVENOUS | Status: DC | PRN
  Administered 2015-02-01: 500 mg via INTRAVENOUS
  Filled 2015-02-01 (×2): qty 5

## 2015-02-01 MED ORDER — PROMETHAZINE HCL 25 MG/ML IJ SOLN
INTRAMUSCULAR | Status: AC
  Filled 2015-02-01: qty 1

## 2015-02-01 MED ORDER — PROMETHAZINE HCL 25 MG/ML IJ SOLN
6.2500 mg | INTRAMUSCULAR | Status: DC | PRN
  Administered 2015-02-01: 12.5 mg via INTRAVENOUS

## 2015-02-01 MED ORDER — DEXAMETHASONE SODIUM PHOSPHATE 10 MG/ML IJ SOLN
INTRAMUSCULAR | Status: DC | PRN
  Administered 2015-02-01: 10 mg via INTRAVENOUS

## 2015-02-01 MED ORDER — EZETIMIBE 10 MG PO TABS
10.0000 mg | ORAL_TABLET | Freq: Every day | ORAL | Status: DC
  Administered 2015-02-02 – 2015-02-04 (×3): 10 mg via ORAL
  Filled 2015-02-01 (×3): qty 1

## 2015-02-01 MED ORDER — ACETAMINOPHEN 650 MG RE SUPP: 650.0000 mg | RECTAL | Status: DC | PRN

## 2015-02-01 MED ORDER — MIDAZOLAM HCL 2 MG/2ML IJ SOLN
INTRAMUSCULAR | Status: AC
  Filled 2015-02-01: qty 4

## 2015-02-01 MED ORDER — LACTATED RINGERS IV SOLN
INTRAVENOUS | Status: DC | PRN
  Administered 2015-02-01 (×3): via INTRAVENOUS

## 2015-02-01 SURGICAL SUPPLY — 61 items
ADH SKN CLS LQ APL DERMABOND (GAUZE/BANDAGES/DRESSINGS) ×1
BAG DECANTER FOR FLEXI CONT (MISCELLANEOUS) ×2 IMPLANT
BLADE CLIPPER SURG (BLADE) IMPLANT
BONE MATRIX OSTEOCEL PRO MED (Bone Implant) ×1 IMPLANT
BUR MATCHSTICK NEURO 3.0 LAGG (BURR) ×2 IMPLANT
CAGE COROENT LRG 9X9X28-8 (Cage) ×6 IMPLANT
CANISTER SUCT 3000ML PPV (MISCELLANEOUS) ×2 IMPLANT
CONT SPEC 4OZ CLIKSEAL STRL BL (MISCELLANEOUS) ×4 IMPLANT
COVER BACK TABLE 60X90IN (DRAPES) ×2 IMPLANT
DECANTER SPIKE VIAL GLASS SM (MISCELLANEOUS) ×2 IMPLANT
DERMABOND ADHESIVE PROPEN (GAUZE/BANDAGES/DRESSINGS) ×1
DERMABOND ADVANCED .7 DNX6 (GAUZE/BANDAGES/DRESSINGS) IMPLANT
DRAPE C-ARM 42X72 X-RAY (DRAPES) ×4 IMPLANT
DRAPE LAPAROTOMY 100X72X124 (DRAPES) ×2 IMPLANT
DRAPE POUCH INSTRU U-SHP 10X18 (DRAPES) ×2 IMPLANT
DRAPE PROXIMA HALF (DRAPES) IMPLANT
DURAPREP 26ML APPLICATOR (WOUND CARE) ×2 IMPLANT
ELECT REM PT RETURN 9FT ADLT (ELECTROSURGICAL) ×2
ELECTRODE REM PT RTRN 9FT ADLT (ELECTROSURGICAL) ×1 IMPLANT
GAUZE SPONGE 4X4 12PLY STRL (GAUZE/BANDAGES/DRESSINGS) ×2 IMPLANT
GAUZE SPONGE 4X4 16PLY XRAY LF (GAUZE/BANDAGES/DRESSINGS) ×1 IMPLANT
GLOVE BIOGEL PI IND STRL 8.5 (GLOVE) ×2 IMPLANT
GLOVE BIOGEL PI INDICATOR 8.5 (GLOVE) ×2
GLOVE ECLIPSE 8.5 STRL (GLOVE) ×4 IMPLANT
GLOVE EXAM NITRILE LRG STRL (GLOVE) IMPLANT
GLOVE EXAM NITRILE MD LF STRL (GLOVE) IMPLANT
GLOVE EXAM NITRILE XL STR (GLOVE) IMPLANT
GLOVE EXAM NITRILE XS STR PU (GLOVE) IMPLANT
GOWN STRL REUS W/ TWL LRG LVL3 (GOWN DISPOSABLE) IMPLANT
GOWN STRL REUS W/ TWL XL LVL3 (GOWN DISPOSABLE) IMPLANT
GOWN STRL REUS W/TWL 2XL LVL3 (GOWN DISPOSABLE) ×4 IMPLANT
GOWN STRL REUS W/TWL LRG LVL3 (GOWN DISPOSABLE)
GOWN STRL REUS W/TWL XL LVL3 (GOWN DISPOSABLE)
HEMOSTAT POWDER KIT SURGIFOAM (HEMOSTASIS) IMPLANT
KIT BASIN OR (CUSTOM PROCEDURE TRAY) ×2 IMPLANT
KIT ROOM TURNOVER OR (KITS) ×2 IMPLANT
LIQUID BAND (GAUZE/BANDAGES/DRESSINGS) ×2 IMPLANT
NDL SPNL 18GX3.5 QUINCKE PK (NEEDLE) IMPLANT
NEEDLE HYPO 22GX1.5 SAFETY (NEEDLE) ×2 IMPLANT
NEEDLE SPNL 18GX3.5 QUINCKE PK (NEEDLE) IMPLANT
NS IRRIG 1000ML POUR BTL (IV SOLUTION) ×2 IMPLANT
PACK LAMINECTOMY NEURO (CUSTOM PROCEDURE TRAY) ×2 IMPLANT
PAD ARMBOARD 7.5X6 YLW CONV (MISCELLANEOUS) ×6 IMPLANT
PATTIES SURGICAL .5 X1 (DISPOSABLE) ×2 IMPLANT
ROD RELINE-O LORD 5.5X110MM (Rod) ×2 IMPLANT
SCREW LOCK RELINE 5.5 TULIP (Screw) ×8 IMPLANT
SCREW RELINE-O POLY 6.5X45 (Screw) ×8 IMPLANT
SPONGE LAP 4X18 X RAY DECT (DISPOSABLE) IMPLANT
SPONGE SURGIFOAM ABS GEL 100 (HEMOSTASIS) ×2 IMPLANT
SUT VIC AB 1 CT1 18XBRD ANBCTR (SUTURE) ×1 IMPLANT
SUT VIC AB 1 CT1 8-18 (SUTURE) ×2
SUT VIC AB 2-0 CP2 18 (SUTURE) ×2 IMPLANT
SUT VIC AB 3-0 SH 8-18 (SUTURE) ×2 IMPLANT
SYR 20ML ECCENTRIC (SYRINGE) ×2 IMPLANT
SYR 3ML LL SCALE MARK (SYRINGE) ×8 IMPLANT
SYR 5ML LL (SYRINGE) IMPLANT
TOWEL OR 17X24 6PK STRL BLUE (TOWEL DISPOSABLE) ×2 IMPLANT
TOWEL OR 17X26 10 PK STRL BLUE (TOWEL DISPOSABLE) ×2 IMPLANT
TRAP SPECIMEN MUCOUS 40CC (MISCELLANEOUS) ×2 IMPLANT
TRAY FOLEY CATH 14FRSI W/METER (CATHETERS) ×2 IMPLANT
WATER STERILE IRR 1000ML POUR (IV SOLUTION) ×2 IMPLANT

## 2015-02-01 NOTE — Anesthesia Preprocedure Evaluation (Addendum)
Anesthesia Evaluation  Patient identified by MRN, date of birth, ID band Patient awake    Reviewed: Allergy & Precautions, H&P , NPO status , Patient's Chart, lab work & pertinent test results, reviewed documented beta blocker date and time   History of Anesthesia Complications Negative for: history of anesthetic complications  Airway Mallampati: II  TM Distance: >3 FB Neck ROM: Full    Dental  (+) Teeth Intact   Pulmonary former smoker,    Pulmonary exam normal breath sounds clear to auscultation       Cardiovascular hypertension, Pt. on home beta blockers and Pt. on medications + dysrhythmias  Rhythm:Regular Rate:Normal  '11 ECHO: EF 55%   Neuro/Psych Back pain: narcotics    GI/Hepatic Neg liver ROS, GERD  Medicated and Poorly Controlled,  Endo/Other  negative endocrine ROS  Renal/GU negative Renal ROS     Musculoskeletal  (+) Arthritis , Rheumatoid disorders,    Abdominal (+) - obese,   Peds  Hematology negative hematology ROS (+)   Anesthesia Other Findings   Reproductive/Obstetrics                             Anesthesia Physical  Anesthesia Plan  ASA: II  Anesthesia Plan: General   Post-op Pain Management:    Induction: Intravenous  Airway Management Planned: Oral ETT  Additional Equipment:   Intra-op Plan:   Post-operative Plan: Extubation in OR  Informed Consent: I have reviewed the patients History and Physical, chart, labs and discussed the procedure including the risks, benefits and alternatives for the proposed anesthesia with the patient or authorized representative who has indicated his/her understanding and acceptance.   Dental advisory given  Plan Discussed with: CRNA, Anesthesiologist and Surgeon  Anesthesia Plan Comments: (Plan routine monitors, GETA )        Anesthesia Quick Evaluation

## 2015-02-01 NOTE — H&P (Signed)
[name] is an 69 y.o. male.   Chief Complaint: Back and right leg pain HPI: Patient is a 69 year old individual who has had a previous herniated nucleus pulposus at L3-4 on the right he has evidence of lumbar radiculopathy on the right side he's been having pain and weakness in the right leg recent workup demonstrates the patient is developing a degenerative scoliosis at L2-3 L3-4 L4-5 has marked foraminal stenosis despite having had previous D compression. He's been advised regarding need for surgical decompression and stabilization from L2-L5. He is now admitted for this process.  Past Medical History  Diagnosis Date  . H/O viral myocarditis     25 years ago  . GERD (gastroesophageal reflux disease)   . Arthritis   . Digestive problems     on Prednisone prn for this issue- diarrhea  . Hyperlipidemia   . Polymyalgia rheumatica   . Dysrhythmia     irregular due to Myocarditis- takes Atenolol, Norvasc  . Head injury, closed, with concussion     Breif LOC  . Mild mitral valve prolapse     per Dr Evette Georges notes    Past Surgical History  Procedure Laterality Date  . Cholecystectomy  2004  . Shoulder open rotator cuff repair Bilateral 2001  . Apendectomy  1965  . Bone spur Bilateral 1999    feet  . Tonsillectomy    . Appendectomy    . Colonoscopy    . Cardiac catheterization  2001  . Lumbar laminectomy/ decompression with met-rx Right 05/05/2013    Procedure: Right Lumbar three-four Extraforaminal Microdiskectomy with Metrex;  Surgeon: [name], MD;  Location: [facility];  Service: Neurosurgery;  Laterality: Right;  Right Lumbar three-four Extraforaminal Microdiskectomy with Metrex    Family History  Problem Relation Age of Onset  . Heart disease Father    Social History:  reports that he quit smoking about 43 years ago. His smoking use included Cigarettes. He quit after 1 year of use. He has never used smokeless tobacco. He reports that he drinks about 8.4 oz of  alcohol per week. He reports that he does not use illicit drugs.  Allergies:  Allergies  Allergen Reactions  . Lidocaine Hcl Anaphylaxis    Xylocaine  . Codeine Nausea And Vomiting  . Crestor [Rosuvastatin Calcium]     Body Aches    Medications Prior to Admission  Medication Sig Dispense Refill  . acetaminophen (TYLENOL) 500 MG tablet Take 500 mg by mouth every 6 (six) hours as needed.    Marland Kitchen amLODipine (NORVASC) 2.5 MG tablet TAKE ONE (1) TABLET EACH DAY 30 tablet 10  . aspirin 81 MG tablet Take 81 mg by mouth. The patient states that he takes every other day    . atenolol (TENORMIN) 25 MG tablet Take 1 tablet (25 mg total) by mouth 2 (two) times daily. NEED OV. 180 tablet 0  . Calcium Citrate-Vitamin D (CITRACAL + D PO) Take 1 tablet by mouth daily.     . cetirizine (ZYRTEC) 10 MG tablet Take 10 mg by mouth daily.    . diphenoxylate-atropine (LOMOTIL) 2.5-0.025 MG per tablet TAKE TWO (2) TABLETS BY MOUTH EVERY MORNING 60 tablet 2  . esomeprazole (NEXIUM) 20 MG capsule Take 20 mg by mouth daily as needed.    . naproxen sodium (ANAPROX) 220 MG tablet Take 440 mg by mouth 2 (two) times daily with a meal.    . ondansetron (ZOFRAN) 4 MG tablet Take 4 mg by mouth every  8 (eight) hours as needed for nausea or vomiting.    . predniSONE (DELTASONE) 10 MG tablet TAKE ONE (1) TABLET EACH DAY. TAKE WITH FOOD. (Patient taking differently: TAKE ONE (1) TABLET EACH DAY. TAKE WITH FOOD. IN THE MORNING) 30 tablet 2  . predniSONE (DELTASONE) 5 MG tablet Take 5 mg by mouth every evening.    Marland Kitchen ZETIA 10 MG tablet TAKE ONE (1) TABLET EACH DAY 30 tablet 10    No results found for this or any previous visit (from the past 48 hour(s)). No results found.  Review of Systems  Constitutional: Negative.   HENT: Negative.   Eyes: Negative.   Respiratory: Negative.   Cardiovascular: Negative.   Gastrointestinal: Negative.   Genitourinary: Negative.   Musculoskeletal: Positive for back pain.  Skin: Negative.    Neurological: Positive for sensory change and focal weakness.    Blood pressure 135/79, pulse 76, temperature 98.6 F (37 C), temperature source Oral, resp. rate 18, height _0  (1.753 m), weight 83.008 kg (183 lb), SpO2 96 %. Physical Exam  Constitutional: He is oriented to person, place, and time. He appears well-developed and well-nourished.  HENT:  Head: Normocephalic and atraumatic.  Eyes: Conjunctivae and EOM are normal. Pupils are equal, round, and reactive to light.  Neck: Normal range of motion. Neck supple.  Cardiovascular: Normal rate and regular rhythm.   Respiratory: Effort normal and breath sounds normal.  GI: Bowel sounds are normal.  Musculoskeletal:  Centralized midline and low back pain. Positive with palpation and percussion.  Neurological: He is alert and oriented to person, place, and time.  Positive straight leg raising at 30 in either lower extremity motor function reveals mild weakness in the quad on the right absent patella reflex on the right absent Achilles reflex on the right weakness in tibialis anterior on the right 4+ out of 5. Sensation is intact in both lower extremities. Cranial nerve examination is normal.  Skin: Skin is warm and dry.  Psychiatric: He has a normal mood and affect. His behavior is normal. Judgment and thought content normal.     Assessment/Plan Spondylosis and stenosis with radiculopathy. Plan: Decompression L2-3 L3-4 and L4-5 with posterior lumbar interbody arthrodesis using peek spacers local autograft allograft pedicle screw fixation L2-L5.  [name] J 02/01/2015, 7:25 AM

## 2015-02-01 NOTE — Anesthesia Postprocedure Evaluation (Signed)
Anesthesia Post Note  Patient: [name]  Procedure(s) Performed: Procedure(s) (LRB): POSTERIOR LUMBAR FUSION 3 LEVEL (N/A)  Anesthesia type: General  Patient location: PACU  Post pain: Pain level controlled  Post assessment: Post-op Vital signs reviewed  Last Vitals: BP 90/48 mmHg  Pulse 66  Temp(Src) 36.4 C (Oral)  Resp 16  Ht 5\' 9"  (1.753 m)  Wt 183 lb (83.008 kg)  BMI 27.01 kg/m2  SpO2 98%  Post vital signs: Reviewed  Level of consciousness: sedated  Complications: Chin pressure injury likely from positioning in OR. Edema improving in PACU. No apparent anesthesia complications

## 2015-02-01 NOTE — Plan of Care (Signed)
Problem: Consults Goal: Diagnosis - Spinal Surgery Outcome: Completed/Met Date Met:  02/01/15 Thoraco/Lumbar Spine Fusion

## 2015-02-01 NOTE — Transfer of Care (Signed)
Immediate Anesthesia Transfer of Care Note  Patient: [name]  Procedure(s) Performed: Procedure(s) with comments: POSTERIOR LUMBAR FUSION 3 LEVEL (N/A) - POSTERIOR LUMBAR FUSION 3 LEVEL LUMBAR 2-3, 3-4, 4-5  Patient Location: PACU  Anesthesia Type:General  Level of Consciousness: awake, alert  and oriented  Airway & Oxygen Therapy: Patient Spontanous Breathing and Patient connected to nasal cannula oxygen  Post-op Assessment: Report given to RN, Post -op Vital signs reviewed and stable and Patient moving all extremities X 4  Post vital signs: Reviewed and stable  Last Vitals:  Filed Vitals:   02/01/15 0623  BP:   Pulse:   Temp: 37 C  Resp:     Complications: No apparent anesthesia complications

## 2015-02-01 NOTE — Anesthesia Procedure Notes (Signed)
Procedure Name: Intubation Date/Time: 02/01/2015 8:19 AM Performed by: Neldon Newport Pre-anesthesia Checklist: Patient being monitored, Suction available, Emergency Drugs available, Patient identified and Timeout performed Patient Re-evaluated:Patient Re-evaluated prior to inductionOxygen Delivery Method: Circle system utilized Preoxygenation: Pre-oxygenation with 100% oxygen Intubation Type: IV induction Ventilation: Mask ventilation without difficulty Laryngoscope Size: Mac and 3 Grade View: Grade II Tube type: Oral Tube size: 7.5 mm Number of attempts: 1 Placement Confirmation: positive ETCO2,  ETT inserted through vocal cords under direct vision and breath sounds checked- equal and bilateral Secured at: 23 cm Tube secured with: Tape Dental Injury: Teeth and Oropharynx as per pre-operative assessment

## 2015-02-01 NOTE — Progress Notes (Signed)
[name], male   DOB: Jun 18, 1945, 69 y.o.   MRN: [id number] Vital signs are stable Motor function is good Dressing is dry Patient comfortable

## 2015-02-01 NOTE — Progress Notes (Signed)
Utilization review completed.  

## 2015-02-01 NOTE — Op Note (Signed)
Date of surgery: 02/01/2015 Preoperative diagnosis: Spinal stenosis L2-3 L3-4 L4-5, degenerative scoliosis L2-L5, lumbar radiculopathy Postoperative diagnosis: Spinal stenosis L2-3 L3-4 L4-5, degenerative scoliosis L2-L5, lumbar radiculopathy Procedure: Lumbar decompression via laminectomy of L2-3 L3-4 L4-5 decompression of L2 L3 L4 and L5 nerve roots individually with more work than require for simple posterior lumbar interbody technique. Posterior lumbar interbody arthrodesis with peek spacers local autograft and allograft pedicle screw fixation L2-L5 segmentally posterior lateral arthrodesis with local autograft and allograft L2-L5 Surgeon: [name] M.D. First assistant: Consuella Lose M.D. Anesthesia: Gen. endotracheal Indications: The patient is a 69 year old individual who's had a previous microdiscectomy at L3-4 on the right he's had persistence of radiculopathy and he has a degenerative scoliosis with significant stenosis that is been aggravating this condition and getting worse despite maximal efforts at conservative management including physical therapy epidural injections and pain management. The patient has been advised regarding surgical decompression and stabilization from L2-L5.  Procedure: The patient was brought to the operating room supine on a stretcher. After the smooth induction of general endotracheal anesthesia, he was turned prone. The bony prominences were appropriately padded and protected. The back was prepped with alcohol and DuraPrep, then draped in a sterile fashion. Midline incision was created and carried down to the lumbar dorsal fascia. The fascia was opened on either side of the midline and the first spinous processes that were identified were noted to be L3 and L4. Then further dissection the subperiosteal plane was performed to expose L5 and L2 superiorly out to and over the facet joints at L2-3 L3-4 and L4-5. The transverse processes from L2-L5 were denuded.  These were packed away for later use in grafting. Then laminotomies were created removing the inferior margin lamina of L2 out to and including the entirety of the facet joint at L2-L3. This was done bilaterally at L2-3 L3-4 and L4-5. The yellow ligament was then lifted and the common dural tube was explored then individually the L2 nerve root was decompressed using accommodation of high-speed drill a 2 and 3 mm Kerrison punch to follow its path out the region of the foramen. The L3 nerve root was similarly decompressed using a 2 and a 3 mm Kerrison punch and a high-speed drill also the L4 nerve root was similarly decompressed and it was noted to be markedly stenotic particularly on the right side and so was the L5 nerve root inferiorly. Once the nerve roots were isolated the disc spaces were then isolated in starting at L3-4 bilateral discectomy was carried out removing the entirety of the disc space in its entire contents down to the articular cartilage this was scraped using a toothed curette to remove all remnants of the cartilage from the endplates. A self-retaining disc spreader was placed into the interspace and the lateral gutters were decompressed and all the disc was removed from this region once the disc space was prepared a series of sizers were used and it was felt that a 9 mm tall 28 mm long 8 lordotic spacer would fit best into the interspace this was filled with accommodation of autograft and allograft the allograft being the test the interspace was filled with 9 mL of this graft material in addition to the 2 spacers attention was turned to L4-5 were similar process was carried out and here the same size spacers were used then at L2-3 a similar process was carried out and again the same size spacers were used once all the interbody grafting was completed pedicle entry  sites were chosen at L2 L3 L4 and L5 singularly using fluoroscopy for guidance 6.5 x 45 mm tap and screw was used to place 86.5 x 45  mm screws in each of the pedicles from L2-L5. These were checked radiographically and then a precontoured 110 mm rod was placed between the screw heads and tightened in a neutral construct. Final radiographs were obtained in AP and lateral projection area these were noted to be good showing an good lordosis and lumbar spine. With this then lateral gutters were packed with the remainder of autograft and allograft. Once this was accomplished the retractors were removed the wound was inspected the area was irrigated copiously with an about your getting solution and lumbar dorsal fascia was closed with #1 Vicryls in interrupted fashion 2-0 Vicryls using 17 is tissues 3-0 Vicryls used to close the subcuticular skin. Patient tolerated procedure well and returned to recovery room in stable condition. Blood loss was estimated at 700 mL, 210 mL of Cell Saver blood was returned to the patient.

## 2015-02-02 MED ORDER — HYDROCODONE-ACETAMINOPHEN 5-325 MG PO TABS
1.0000 | ORAL_TABLET | Freq: Four times a day (QID) | ORAL | Status: DC | PRN
  Administered 2015-02-02 – 2015-02-04 (×5): 2 via ORAL
  Filled 2015-02-02 (×5): qty 2

## 2015-02-02 MED FILL — Sodium Chloride IV Soln 0.9%: INTRAVENOUS | Qty: 2000 | Status: AC

## 2015-02-02 MED FILL — Heparin Sodium (Porcine) Inj 1000 Unit/ML: INTRAMUSCULAR | Qty: 30 | Status: AC

## 2015-02-02 NOTE — Clinical Social Work Note (Addendum)
CSW Consult Acknowledged:   CSW received a consult for SNF placement. CSW awaiting PT/OT evaluation to determine the appropriate level of care.      Addendum: Per PT's note the appropriate level of care is home. CSW will sign off.   Ammar Moffatt, MSW, LCSWA [phone]  

## 2015-02-02 NOTE — Progress Notes (Signed)
[name], male   DOB: January 05, 1946, 69 y.o.   MRN: [id number] Vital signs are stable Motor function appears intact in lower extremities Patient is emulating with a walker Foley catheter is out Continue supportive care today Stable status post three-level lumbar decompression and fusion

## 2015-02-02 NOTE — Progress Notes (Addendum)
Occupational Therapy Evaluation Patient Details Name: [name] MRN: [id number] DOB: 1945/08/18 Today's Date: 02/02/2015    History of Present Illness Patient is a 69 yo male s/p 3 level posterior lumbar interbody fusion.   Clinical Impression   Making excellent progress. Completed all education regarding compensatory techniques and use of available AE/DME for ADL. Pt able to demonstrate understanding. Pt appropriate for D/C home with intermittent S when medically stable. Written information given. Pt very appreciative. OT signing off.     Follow Up Recommendations  No OT follow up;Supervision - Intermittent    Equipment Recommendations  None recommended by OT    Recommendations for Other Services       Precautions / Restrictions Precautions Precautions: Back Precaution Booklet Issued: Yes (comment) Precaution Comments: verbally reviewed with visual teachback Required Braces or Orthoses: Spinal Brace Spinal Brace: Lumbar corset      Mobility Bed Mobility Overal bed mobility: Needs Assistance Bed Mobility: Rolling;Sit to Sidelying Rolling: Modified independent (Device/Increase time) Sidelying to sit: Supervision     Sit to sidelying: Supervision General bed mobility comments: good carry over from earlier PT session  Transfers Overall transfer level: Needs assistance Equipment used: Rolling walker (2 wheeled) Transfers: Sit to/from Stand Sit to Stand: Supervision         General transfer comment: VCs for hand placement with RW    Balance Overall balance assessment: No apparent balance deficits (not formally assessed)                                          ADL Overall ADL's : Needs assistance/impaired                                     Functional mobility during ADLs: Supervision/safety;Cueing for sequencing General ADL Comments: Educated pt on back precautions for ADL and use of available AE and DME to help  adhere to precautions. Pt verbalized understadning on how to bth,dress, complete grooming and peri care after toileting using techniques/DME/AE. also discussed home set up to maximize functional level of independence.  (independent with donning/doffing brace)Recommended pt use tub seat for shower and place 3 in 1 over toilet. Also recommended pt use reacher for ADL and home use as needed. Again, reviewed back precautions and no lifting.                     Pertinent Vitals/Pain Pain Assessment: 0-10 Pain Score: 6  Pain Location: back Pain Descriptors / Indicators: Aching;Burning Pain Intervention(s): Limited activity within patient's tolerance;Monitored during session;Repositioned;Ice applied     Hand Dominance     Extremity/Trunk Assessment Upper Extremity Assessment Upper Extremity Assessment: Overall WFL for tasks assessed   Lower Extremity Assessment Lower Extremity Assessment: Overall WFL for tasks assessed   Cervical / Trunk Assessment Cervical / Trunk Assessment: Normal   Communication Communication Communication: No difficulties   Cognition Arousal/Alertness: Awake/alert Behavior During Therapy: WFL for tasks assessed/performed Overall Cognitive Status: Within Functional Limits for tasks assessed                     General Comments       Exercises  Pt asking about exercising - deferred to MD     Shoulder Instructions      Home Living Family/patient expects to be  discharged to:: Private residence Living Arrangements: Spouse/significant other Available Help at Discharge: Family;Available PRN/intermittently (wife is a retired Marine scientist and is available to help as needed) Type of Home: House Home Access: Stairs to enter Technical brewer of Steps: 1 Entrance Stairs-Rails: None Home Layout: One level     Bathroom Shower/Tub: Tub/shower unit Shower/tub characteristics: Architectural technologist: Standard Bathroom Accessibility: Yes How  Accessible: Accessible via walker Home Equipment: Other (comment) (has access to Bathroom DME)          Prior Functioning/Environment Level of Independence: Independent             OT Diagnosis: Generalized weakness;Acute pain   OT Problem List: Decreased knowledge of use of DME or AE;Pain   OT Treatment/Interventions:      OT Goals(Current goals can be found in the care plan section) Acute Rehab OT Goals Patient Stated Goal: to do things I enjoy OT Goal Formulation: All assessment and education complete, DC therapy  OT Frequency:     Barriers to D/C:            Co-evaluation              End of Session Equipment Utilized During Treatment: Back brace  Activity Tolerance: Patient tolerated treatment well Patient left: in bed;with call bell/phone within reach;with SCD's reapplied   Time: 0943-1010 OT Time Calculation (min): 27 min Charges:  OT General Charges $OT Visit: 1 Procedure OT Evaluation $Initial OT Evaluation Tier I: 1 Procedure OT Treatments $Self Care/Home Management : 8-22 mins G-Codes:    Danisha Brassfield,HILLARY Mar 01, 2015, 10:17 AM   Maurie Boettcher, OTR/L  [phone] 03-01-2015

## 2015-02-02 NOTE — Evaluation (Signed)
Physical Therapy Evaluation Patient Details Name: [name] MRN: [id number] DOB: Mar 08, 1946 Today's Date: 02/02/2015   History of Present Illness  Patient is a 69 yo male s/p 3 level posterior lumbar interbody fusion.  Clinical Impression  Patient demonstrates deficits in functional mobility as indicated below. Patient will benefit from continued skilled PT to address deficits and reinforce education. Will see as indicated and progress as tolerated.    Follow Up Recommendations No PT follow up;Supervision - Intermittent    Equipment Recommendations  None recommended by PT    Recommendations for Other Services       Precautions / Restrictions Precautions Precautions: Back Precaution Booklet Issued: Yes (comment) Precaution Comments: verbally reviewed with visual teachback Required Braces or Orthoses: Spinal Brace Spinal Brace: Lumbar corset      Mobility  Bed Mobility Overal bed mobility: Needs Assistance Bed Mobility: Rolling;Sidelying to Sit Rolling: Supervision Sidelying to sit: Supervision       General bed mobility comments: VCs for technique, increased time to perform, no physical assist required  Transfers Overall transfer level: Needs assistance Equipment used: Rolling walker (2 wheeled) Transfers: Sit to/from Stand Sit to Stand: Supervision         General transfer comment: VCs for hand placement with RW  Ambulation/Gait Ambulation/Gait assistance: Supervision Ambulation Distance (Feet): 260 Feet Assistive device: Rolling walker (2 wheeled) (20 ft without Rw) Gait Pattern/deviations: Step-through pattern;Decreased stride length (initially flexed posture, improved with mobility) Gait velocity: decreased, VCs for increased cadence   General Gait Details: steady gait, able to progress to no use of RW  Stairs            Wheelchair Mobility    Modified Rankin (Stroke Patients Only)       Balance Overall balance assessment: No  apparent balance deficits (not formally assessed)                                           Pertinent Vitals/Pain Pain Assessment: 0-10 Pain Score: 5  Pain Location: back Pain Descriptors / Indicators: Operative site guarding Pain Intervention(s): Monitored during session    Home Living Family/patient expects to be discharged to:: Private residence Living Arrangements: Spouse/significant other Available Help at Discharge: Family;Available PRN/intermittently (wife is a retired Marine scientist and is available to help as needed) Type of Home: House Home Access: Stairs to enter Entrance Stairs-Rails: None Technical brewer of Steps: 1 Home Layout: One level Home Equipment: None      Prior Function Level of Independence: Independent               Hand Dominance        Extremity/Trunk Assessment   Upper Extremity Assessment: Overall WFL for tasks assessed           Lower Extremity Assessment: Overall WFL for tasks assessed (improvements noted in R LE pain)         Communication   Communication: No difficulties  Cognition Arousal/Alertness: Awake/alert Behavior During Therapy: WFL for tasks assessed/performed Overall Cognitive Status: Within Functional Limits for tasks assessed                      General Comments      Exercises        Assessment/Plan    PT Assessment Patient needs continued PT services  PT Diagnosis Difficulty walking;Acute pain   PT Problem List Decreased  strength;Decreased activity tolerance;Decreased balance;Decreased mobility;Decreased coordination;Decreased knowledge of use of DME;Decreased safety awareness;Decreased knowledge of precautions  PT Treatment Interventions DME instruction;Gait training;Stair training;Functional mobility training;Therapeutic activities;Therapeutic exercise;Balance training;Patient/family education   PT Goals (Current goals can be found in the Care Plan section) Acute Rehab PT  Goals Patient Stated Goal: to go home PT Goal Formulation: With patient Time For Goal Achievement: 02/16/15 Potential to Achieve Goals: Good    Frequency Min 5X/week   Barriers to discharge        Co-evaluation               End of Session Equipment Utilized During Treatment: Gait belt;Back brace Activity Tolerance: Patient tolerated treatment well Patient left: in chair;with call bell/phone within reach           Time: 0859-0916 PT Time Calculation (min) (ACUTE ONLY): 17 min   Charges:   PT Evaluation $Initial PT Evaluation Tier I: 1 Procedure     PT G CodesDuncan Dull February 08, 2015, 10:13 AM  Alben Deeds, PT DPT  [phone]

## 2015-02-03 MED ORDER — METHOCARBAMOL 500 MG PO TABS: 500.0000 mg | ORAL_TABLET | Freq: Four times a day (QID) | ORAL | Status: DC | PRN

## 2015-02-03 MED ORDER — ZOLPIDEM TARTRATE 5 MG PO TABS: 5.0000 mg | ORAL_TABLET | Freq: Every evening | ORAL | Status: DC | PRN

## 2015-02-03 MED ORDER — HYDROCODONE-ACETAMINOPHEN 5-325 MG PO TABS: 1.0000 | ORAL_TABLET | Freq: Four times a day (QID) | ORAL | Status: DC | PRN

## 2015-02-03 NOTE — Progress Notes (Signed)
[name], male   DOB: May 21, 1945, 69 y.o.   MRN: [id number] Vital signs are stable Motor function is intact Dressings dry Mobilizing well Will allow to shower Plan discharge tomorrow

## 2015-02-03 NOTE — Progress Notes (Signed)
Physical Therapy Treatment Patient Details Name: [name] MRN: [id number] DOB: Apr 11, 1946 Today's Date: 02/03/2015    History of Present Illness Patient is a 69 yo male s/p 3 level posterior lumbar interbody fusion.    PT Comments    Pt alert/oriented and willing to participate with PT.  Pt able to perform bed mobility with modified independence.  He is able to perform transfers  and gait with supervision and verbal cueing.  Pt started gait using RW and was able to progress to no assistive device. Pt verbalizing that he is more comfortable using the RW despite demonstrating good dynamic balance and safety without assistive device.  Continue PT POC to continue working toward increased independence and comfort without assistive device with functional mobility.   Follow Up Recommendations  No PT follow up;Supervision - Intermittent     Equipment Recommendations  None recommended by PT    Recommendations for Other Services       Precautions / Restrictions Precautions Precautions: Back Precaution Comments: verbally reviewed precautions with teachback; pt with good understanding Required Braces or Orthoses: Spinal Brace Spinal Brace: Lumbar corset Restrictions Weight Bearing Restrictions: No    Mobility  Bed Mobility Overal bed mobility: Needs Assistance Bed Mobility: Sit to Sidelying;Rolling Rolling: Modified independent (Device/Increase time)       Sit to sidelying: Modified independent (Device/Increase time) (increaed time)    Transfers Overall transfer level: Needs assistance Equipment used: Rolling walker (2 wheeled) Transfers: Sit to/from Stand Sit to Stand: Supervision         General transfer comment: VC to scoot to edge of surface, foot/hand placement, and to bring hips forward to acheive upright  Ambulation/Gait Ambulation/Gait assistance: Supervision Ambulation Distance (Feet): 460 Feet (with multiple standing rest breaks) Assistive device:  None Gait Pattern/deviations: Step-through pattern;Decreased stride length (guarded movement) Gait velocity: decreased, VCs for increased cadence   General Gait Details: progressed from RW to no assistive device after 40 feet; guarded movement with pt requesting to use RW but pt with steady gait not needing it; pt educated on importance of practicing normalized gait to gain strength back   Stairs Stairs: Yes Stairs assistance: Modified independent (Device/Increase time) Stair Management: Two rails;Step to pattern Number of Stairs: 2 General stair comments: pt with increased pain but good technique and no LOB  Wheelchair Mobility    Modified Rankin (Stroke Patients Only)       Balance Overall balance assessment: No apparent balance deficits (not formally assessed)                                  Cognition Arousal/Alertness: Awake/alert Behavior During Therapy: WFL for tasks assessed/performed Overall Cognitive Status: Within Functional Limits for tasks assessed                      Exercises      General Comments        Pertinent Vitals/Pain Pain Assessment: 0-10 Pain Score: 3  Pain Location: back Pain Descriptors / Indicators: Aching;Operative site guarding Pain Intervention(s): Limited activity within patient's tolerance;Monitored during session;Repositioned    Home Living                      Prior Function            PT Goals (current goals can now be found in the care plan section) Acute Rehab PT Goals Patient Stated Goal:  to go home PT Goal Formulation: With patient Time For Goal Achievement: 02/16/15 Potential to Achieve Goals: Good Progress towards PT goals: Progressing toward goals    Frequency  Min 5X/week    PT Plan Current plan remains appropriate    Co-evaluation             End of Session Equipment Utilized During Treatment: Gait belt;Back brace Activity Tolerance: Patient tolerated treatment  well Patient left: in bed;with call bell/phone within reach     Time: 0819-0835 PT Time Calculation (min) (ACUTE ONLY): 16 min  Charges:  $Gait Training: 8-22 mins                    G Codes:      Corrin Sieling Feb 10, 2015, 10:35 AM

## 2015-02-03 NOTE — Discharge Summary (Signed)
Physician Discharge Summary  [name] MRN: [id number] DOB/AGE: 1945/12/15 69 y.o.  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 He tolerated surgery well. His incision is clean and dry.  Consults: None  Significant Diagnostic Studies: None  Treatments: Decompression of L2-3 L3-4 L4-5 via laminotomies foraminotomies total discectomy L2-3 L3-4 L4-5 posterior lumbar interbody arthrodesis using peek spacers local autograft and allograft L2-3 L3-4 L4-5 segmental fixation L2-L5 with posterior lateral arthrodesis using local autograft and allograft.  Discharge Exam: Blood pressure 115/68, pulse 74, temperature 98.1 F (36.7 C), temperature source Oral, resp. rate 20, height 5\' 9"  (1.753 m), weight 83.008 kg (183 lb), SpO2 92 %. Incision is clean and dry motor function station and gait are intact  Disposition: 01-Home or Self Care  Discharge Instructions    Call MD for:  redness, tenderness, or signs of infection (pain, swelling, redness, odor or green/yellow discharge around incision site)    Complete by:  As directed      Call MD for:  severe uncontrolled pain    Complete by:  As directed      Call MD for:  temperature >100.4    Complete by:  As directed      Diet - low sodium heart healthy    Complete by:  As directed      Discharge instructions    Complete by:  As directed   Okay to shower. Do not apply salves or appointments to incision. No heavy lifting with the upper extremities greater than 15 pounds. May resume driving when not requiring pain medication and patient feels  comfortable with doing so.     Increase activity slowly    Complete by:  As directed             Medication List    TAKE these medications        acetaminophen 500 MG tablet  Commonly known as:  TYLENOL  Take 500 mg by mouth every 6 (six) hours as needed.     amLODipine 2.5 MG tablet  Commonly known as:  NORVASC  TAKE ONE (1) TABLET EACH DAY     aspirin 81 MG tablet  Take 81 mg by mouth. The patient states that he takes every other day     atenolol 25 MG tablet  Commonly known as:  TENORMIN  Take 1 tablet (25 mg total) by mouth 2 (two) times daily. NEED OV.     cetirizine 10 MG tablet  Commonly known as:  ZYRTEC  Take 10 mg by mouth daily.     CITRACAL + D PO  Take 1 tablet by mouth daily.     diphenoxylate-atropine 2.5-0.025 MG per tablet  Commonly known as:  LOMOTIL  TAKE TWO (2) TABLETS BY MOUTH EVERY MORNING     esomeprazole 20 MG capsule  Commonly known as:  NEXIUM  Take 20 mg by mouth daily as needed.     HYDROcodone-acetaminophen 5-325 MG per tablet  Commonly known as:  NORCO/VICODIN  Take 1-2 tablets by mouth every 6 (six) hours as needed for moderate pain.     methocarbamol 500 MG tablet  Commonly known as:  ROBAXIN  Take 1 tablet (500  mg total) by mouth every 6 (six) hours as needed for muscle spasms.     naproxen sodium 220 MG tablet  Commonly known as:  ANAPROX  Take 440 mg by mouth 2 (two) times daily with a meal.     ondansetron 4 MG tablet  Commonly known as:  ZOFRAN  Take 4 mg by mouth every 8 (eight) hours as needed for nausea or vomiting.     predniSONE 5 MG tablet  Commonly known as:  DELTASONE  Take 5 mg by mouth every evening.     predniSONE 10 MG tablet  Commonly known as:  DELTASONE  TAKE ONE (1) TABLET EACH DAY. TAKE WITH FOOD.     ZETIA 10 MG tablet  Generic drug:  ezetimibe  TAKE ONE (1) TABLET EACH DAY     zolpidem 5 MG tablet  Commonly known as:  AMBIEN  Take 1 tablet (5 mg total) by mouth at bedtime as needed for  sleep.         SignedEarleen Newport 02/03/2015, 5:24 PM

## 2015-02-23 DIAGNOSIS — M4156 Other secondary scoliosis, lumbar region: Secondary | ICD-10-CM | POA: Diagnosis not present

## 2015-02-23 DIAGNOSIS — Z6826 Body mass index (BMI) 26.0-26.9, adult: Secondary | ICD-10-CM | POA: Diagnosis not present

## 2015-03-09 DIAGNOSIS — I10 Essential (primary) hypertension: Secondary | ICD-10-CM | POA: Diagnosis not present

## 2015-03-09 DIAGNOSIS — E785 Hyperlipidemia, unspecified: Secondary | ICD-10-CM | POA: Diagnosis not present

## 2015-03-09 DIAGNOSIS — R945 Abnormal results of liver function studies: Secondary | ICD-10-CM | POA: Diagnosis not present

## 2015-03-09 DIAGNOSIS — R7301 Impaired fasting glucose: Secondary | ICD-10-CM | POA: Diagnosis not present

## 2015-03-09 DIAGNOSIS — Z23 Encounter for immunization: Secondary | ICD-10-CM | POA: Diagnosis not present

## 2015-03-15 DIAGNOSIS — E782 Mixed hyperlipidemia: Secondary | ICD-10-CM | POA: Diagnosis not present

## 2015-03-15 DIAGNOSIS — R7301 Impaired fasting glucose: Secondary | ICD-10-CM | POA: Diagnosis not present

## 2015-03-15 DIAGNOSIS — I10 Essential (primary) hypertension: Secondary | ICD-10-CM | POA: Diagnosis not present

## 2015-04-19 DIAGNOSIS — M79642 Pain in left hand: Secondary | ICD-10-CM | POA: Diagnosis not present

## 2015-04-19 DIAGNOSIS — M545 Low back pain: Secondary | ICD-10-CM | POA: Diagnosis not present

## 2015-04-19 DIAGNOSIS — M79641 Pain in right hand: Secondary | ICD-10-CM | POA: Diagnosis not present

## 2015-04-19 DIAGNOSIS — M0609 Rheumatoid arthritis without rheumatoid factor, multiple sites: Secondary | ICD-10-CM | POA: Diagnosis not present

## 2015-04-28 DIAGNOSIS — M5126 Other intervertebral disc displacement, lumbar region: Secondary | ICD-10-CM | POA: Diagnosis not present

## 2015-04-28 DIAGNOSIS — M5416 Radiculopathy, lumbar region: Secondary | ICD-10-CM | POA: Diagnosis not present

## 2015-04-28 DIAGNOSIS — Z6826 Body mass index (BMI) 26.0-26.9, adult: Secondary | ICD-10-CM | POA: Diagnosis not present

## 2015-05-06 ENCOUNTER — Other Ambulatory Visit: Payer: Self-pay | Admitting: Cardiovascular Disease

## 2015-05-06 ENCOUNTER — Telehealth: Payer: Self-pay | Admitting: Cardiovascular Disease

## 2015-05-06 MED ORDER — ATENOLOL 25 MG PO TABS: 25.0000 mg | ORAL_TABLET | Freq: Two times a day (BID) | ORAL | Status: DC

## 2015-05-06 NOTE — Telephone Encounter (Signed)
Refilled atenolol  X 1 until office visit Patient aware Notified [facility]

## 2015-05-06 NOTE — Telephone Encounter (Signed)
Pt can not get his Atenolol until he see Dr Claiborne Billings. Will this be okay? His appointment is 05-17-15.

## 2015-05-17 ENCOUNTER — Encounter: Payer: Self-pay | Admitting: Cardiovascular Disease

## 2015-05-17 ENCOUNTER — Ambulatory Visit (INDEPENDENT_AMBULATORY_CARE_PROVIDER_SITE_OTHER): Payer: PPO | Admitting: Cardiovascular Disease

## 2015-05-17 VITALS — BP 132/84 | HR 82 | Ht 69.0 in | Wt 184.0 lb

## 2015-05-17 DIAGNOSIS — E785 Hyperlipidemia, unspecified: Secondary | ICD-10-CM

## 2015-05-17 DIAGNOSIS — I10 Essential (primary) hypertension: Secondary | ICD-10-CM

## 2015-05-17 DIAGNOSIS — M48061 Spinal stenosis, lumbar region without neurogenic claudication: Secondary | ICD-10-CM

## 2015-05-17 DIAGNOSIS — M4806 Spinal stenosis, lumbar region: Secondary | ICD-10-CM | POA: Diagnosis not present

## 2015-05-17 DIAGNOSIS — Z8679 Personal history of other diseases of the circulatory system: Secondary | ICD-10-CM

## 2015-05-17 NOTE — Patient Instructions (Signed)
Your physician wants you to follow-up in: 1 year or sooner if needed. You will receive a reminder letter in the mail two months in advance. If you don't receive a letter, please call our office to schedule the follow-up appointment.   If you need a refill on your cardiac medications before your next appointment, please call your pharmacy.   

## 2015-05-18 ENCOUNTER — Encounter: Payer: Self-pay | Admitting: Cardiovascular Disease

## 2015-05-18 NOTE — Progress Notes (Signed)
[name], male   DOB: [birth year], 70 y.o.   MRN: [id number]    Primary M.D.: Dr. Wende Neighbors  HPI: [name] is a 70 y.o. male who presents for one-year cardiology follow-up evaluation.  [name] has remote history of viral myocarditis in 1989 at which time his ejection fraction was 25%. He subsequently normalized LV function. He has a history of mild mitral valve prolapse. Cardiac catheterization in 2001 showed mild mid systolic LAD bridging. He has been noted to have mild T-wave changes on his ECG. Additional problems include hypertension as well as hyperlipidemia. He was unable to tolerate Lipitor. He initially tolerated Crestor but then developed significant myalgias.  He has been able to tolerate Zetia 10 mg daily.   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  Apparently, this has stabilized and is tired.  Ears have almost normalized.  He has been starting to resume very small amounts of red meat.    Past year, he has remained active.  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  He denies any episodes of chest pain, or change in exercise tolerance.  On 02/01/2015.  He underwent lumbar surgery by [name] involving L2-L3, L3-L4, and L4-L5.  He tolerated surgery without cardiovascular compromise. He has been on amlodipine 2.5 mg and atenolol 25 g twice a day, which has been helpful for his blood pressure, palpitations, as well as documented systolic LAD muscle bridging.  He presents for one-year evaluation.  Past Medical History  Diagnosis Date  . H/O viral myocarditis     25 years ago  . GERD (gastroesophageal reflux disease)   . Arthritis   . Digestive problems     on Prednisone prn for this issue- diarrhea  . Hyperlipidemia   . Polymyalgia rheumatica ([place])   . Dysrhythmia     irregular due to Myocarditis- takes Atenolol, Norvasc  . Head injury, closed, with concussion ([place])     Breif LOC  .  Mild mitral valve prolapse     per Dr Evette Georges notes    Past Surgical History  Procedure Laterality Date  . Cholecystectomy  2004  . Shoulder open rotator cuff repair Bilateral 2001  . Apendectomy  1965  . Bone spur Bilateral 1999    feet  . Tonsillectomy    . Appendectomy    . Colonoscopy    . Cardiac catheterization  2001  . Lumbar laminectomy/ decompression with met-rx Right 05/05/2013    Procedure: Right Lumbar three-four Extraforaminal Microdiskectomy with Metrex;  Surgeon: [name], MD;  Location: [facility];  Service: Neurosurgery;  Laterality: Right;  Right Lumbar three-four Extraforaminal Microdiskectomy with Metrex    Allergies  Allergen Reactions  . Lidocaine Hcl Anaphylaxis    Xylocaine  . Codeine Nausea And Vomiting  . Crestor [Rosuvastatin Calcium]     Body Aches  . Percocet [Oxycodone-Acetaminophen] Itching    Current Outpatient Prescriptions  Medication Sig Dispense Refill  . acetaminophen (TYLENOL) 500 MG tablet Take 500 mg by mouth every 6 (six) hours as needed.    Marland Kitchen amLODipine (NORVASC) 2.5 MG tablet TAKE ONE (1) TABLET EACH DAY 30 tablet 10  . aspirin 81 MG tablet Take 81 mg by mouth daily.     Marland Kitchen atenolol (TENORMIN) 25 MG tablet Take 1 tablet (25 mg total) by mouth 2 (two) times daily. NEED OV. 180 tablet 0  . celecoxib (CELEBREX) 200  MG capsule Take 200 mg by mouth daily.    . cetirizine (ZYRTEC) 10 MG tablet Take 10 mg by mouth daily as needed.     . Cholecalciferol (VITAMIN D3) 2000 units TABS Take 1 capsule by mouth daily.    . naproxen sodium (ANAPROX) 220 MG tablet Take 440 mg by mouth daily as needed.     . ondansetron (ZOFRAN) 4 MG tablet Take 4 mg by mouth every 8 (eight) hours as needed for nausea or vomiting.    Marland Kitchen ZETIA 10 MG tablet TAKE ONE (1) TABLET EACH DAY 30 tablet 10   No current facility-administered medications for this visit.    Social History   Social History  . Marital Status: Married    Spouse Name: N/A  . Number of  Children: N/A  . Years of Education: N/A   Occupational History  . Not on file.   Social History Main Topics  . Smoking status: Former Smoker -- 1 years    Types: Cigarettes    Quit date: 07/10/1971  . Smokeless tobacco: Never Used     Comment: Patient smoked 1/2 pack per week  . Alcohol Use: 8.4 oz/week    14 Glasses of wine per week     Comment: 1-2 glasses of wine with evening meal  . Drug Use: No  . Sexual Activity: Yes    Birth Control/ Protection: Other-see comments     Comment: old age   Other Topics Concern  . 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 He is married has 2 children. There is no tobacco use. He stays active. There is no alcohol use.  He is still working but had a significantly reduced pace than he had previously.  Family history is notable that both parents are deceased.  Mother died of old age.  Father died but had a history of mitral valve prolapse.  He has 2 brothers and one sister who are  alive and well.   ROS General: Negative; No fevers, chills, or night sweats;  HEENT: Negative; No changes in vision or hearing, sinus congestion, difficulty swallowing Pulmonary: Negative; No cough, wheezing, shortness of breath, hemoptysis Cardiovascular: See history of present illness GI: No recent diarrhea. GU: Negative; No dysuria, hematuria, or difficulty voiding Musculoskeletal: Negative; no myalgias, joint pain, or weakness Hematologic/Oncology: Negative; no easy bruising, bleeding Endocrine: Negative; no heat/cold intolerance; no diabetes Neuro: Negative; no changes in balance, headaches Skin: Negative; No rashes or skin lesions Psychiatric: Negative; No behavioral problems, depression Sleep: Negative; No snoring, daytime sleepiness, hypersomnolence, bruxism, restless legs, hypnogognic hallucinations, no cataplexy Other comprehensive 14 point system review is negative.  PE BP 132/84 mmHg  Pulse 82  Ht '5\' 9"'  (1.753 m)  Wt  184 lb (83.462 kg)  BMI 27.16 kg/m2   Wt Readings from Last 3 Encounters:  05/17/15 184 lb (83.462 kg)  02/01/15 183 lb (83.008 kg)  01/24/15 183 lb 14.4 oz (83.416 kg)   General: Alert, oriented, no distress.  Skin: normal turgor, no rashes HEENT: Normocephalic, atraumatic. Pupils round and reactive; sclera anicteric;no lid lag.  Nose without nasal septal hypertrophy Mouth/Parynx benign; Mallinpatti scale 2 Neck: No JVD, no carotid bruits with normal carotid upstroke Lungs: clear to ausculatation and percussion; no wheezing or rales Chest wall: Nontender to palpation Heart: RRR, s1 s2 normal 1/6 systolic murmur, no S3 gallop.  No diastolic murmur.  No rubs thrills or heaves. Abdomen: soft, nontender; no hepatosplenomehaly, BS+; abdominal  aorta nontender and not dilated by palpation. Back: No CVA tenderness Pulses 2+ Extremities: no clubbing cyanosis or edema, Homan's sign negative  Neurologic: grossly nonfocal; cranial nerves intact Psychologic: normal affect and mood.  ECG (independently read by me): Normal sinus rhythm at 82 bpm.  Mild RV conduction delay.  Normal intervals.  No significant ST-T changes.  December 2015 ECG (independently read by me): Normal sinus rhythm at 74 bpm.  Mild RV conduction delay.  QTc interval 461 ms.  December 2014 ECG: Sinus rhythm at 57 beats per minute. No ectopy. Normal intervals.  LABS:  BMP Latest Ref Rng 01/24/2015 04/30/2013 01/22/2007  Glucose 65 - 99 mg/dL 110(H) 100(H) 114(H)  BUN 6 - 20 mg/dL '9 18 8  ' Creatinine 0.61 - 1.24 mg/dL 0.87 0.97 0.99  Sodium 135 - 145 mmol/L 140 136 142  Potassium 3.5 - 5.1 mmol/L 4.2 3.9 4.0  Chloride 101 - 111 mmol/L 106 100 111  CO2 22 - 32 mmol/L '25 29 26  ' Calcium 8.9 - 10.3 mg/dL 9.4 8.6 8.9   Hepatic Function Latest Ref Rng 09/16/2012 01/22/2007  Total Protein 6.0 - 8.3 g/dL 5.9(L) 6.5  Albumin 3.5 - 5.2 g/dL 3.9 3.8  AST 0 - 37 U/L 30 22  ALT 0 - 53 U/L 35 21  Alk Phosphatase 39 - 117 U/L 48 88    Total Bilirubin 0.3 - 1.2 mg/dL 1.0 1.4(H)  Bilirubin, Direct 0.0 - 0.3 mg/dL 0.1 -   CBC Latest Ref Rng 01/24/2015 04/30/2013 11/13/2012  WBC 4.0 - 10.5 K/uL 10.0 6.3 5.3  Hemoglobin 13.0 - 17.0 g/dL 17.3(H) 17.7(H) 15.3  Hematocrit 39.0 - 52.0 % 48.6 48.8 42.7  Platelets 150 - 400 K/uL 192 213 211   Lab Results  Component Value Date   MCV 105.7* 01/24/2015   MCV 106.3* 04/30/2013   MCV 107.0* 11/13/2012    No results found for: TSH   No results found for: HGBA1C   Lipid Panel  No results found for: CHOL, TRIG, HDL, CHOLHDL, VLDL, LDLCALC, LDLDIRECT   ASSESSMENT AND PLAN: [name] is a 70 year old Caucasian male who and continues to do well 27 years after he developed a viral myocarditis associated with an ejection fraction of 25%. His last echo Doppler study in October 2011 showed an ejection fraction greater than 55%. He did have mild TR.  He is not having any palpitations on his current dose of atenolol 25 mg twice a day.  He has a history of mitral valve prolapse involving the anterior mitral valve leaflet which was seen on prior echo Doppler studies.  His father also had mitral valve prolapse and is deceased.  He is not having any chest pain, and has done well with amlodipine 2.5 mg.  At cardiac catheterization he was noted have significant LAD systolic bridging.  His blood pressure is stable on current therapy.  He now is on Zetia for hyperlipidemia.  He sees Dr. Wende Neighbors for primary care who will be rechecking blood work.  [name] denies any palpitations.  He denies PND, orthopnea, and has no signs or symptoms of residual CHF.  His ECG remained stable.  His QTc interval is normal at 443 ms.  Tolerated lumbar surgery without cardiovascular compromise.  As long as he continues to do well.  He will continue his current medical regimen.  Remotely, he was intolerant to Crestor, but has been able to tolerate Zetia for his hyperlipidemia.  As long as he remains stable,  I  will see him in one year for reevaluation.   Time spent: 25 minutes  Troy Sine, MD, [facility]  05/18/2015 7:53 PM

## 2015-05-31 DIAGNOSIS — M5126 Other intervertebral disc displacement, lumbar region: Secondary | ICD-10-CM | POA: Diagnosis not present

## 2015-06-03 DIAGNOSIS — M5416 Radiculopathy, lumbar region: Secondary | ICD-10-CM | POA: Diagnosis not present

## 2015-06-03 DIAGNOSIS — Z981 Arthrodesis status: Secondary | ICD-10-CM | POA: Diagnosis not present

## 2015-06-03 DIAGNOSIS — M545 Low back pain: Secondary | ICD-10-CM | POA: Diagnosis not present

## 2015-06-03 DIAGNOSIS — M47816 Spondylosis without myelopathy or radiculopathy, lumbar region: Secondary | ICD-10-CM | POA: Diagnosis not present

## 2015-06-24 ENCOUNTER — Other Ambulatory Visit: Payer: Self-pay | Admitting: Cardiovascular Disease

## 2015-06-27 NOTE — Telephone Encounter (Signed)
Rx(s) sent to pharmacy electronically.  

## 2015-07-05 ENCOUNTER — Other Ambulatory Visit: Payer: Self-pay | Admitting: Cardiovascular Disease

## 2015-07-06 NOTE — Telephone Encounter (Signed)
Rx(s) sent to pharmacy electronically.  

## 2015-07-20 DIAGNOSIS — Z6827 Body mass index (BMI) 27.0-27.9, adult: Secondary | ICD-10-CM | POA: Diagnosis not present

## 2015-07-20 DIAGNOSIS — M5416 Radiculopathy, lumbar region: Secondary | ICD-10-CM | POA: Diagnosis not present

## 2015-08-23 ENCOUNTER — Other Ambulatory Visit: Payer: Self-pay | Admitting: Cardiovascular Disease

## 2015-08-23 NOTE — Telephone Encounter (Signed)
Rx(s) sent to pharmacy electronically.  

## 2015-08-29 DIAGNOSIS — M5416 Radiculopathy, lumbar region: Secondary | ICD-10-CM | POA: Diagnosis not present

## 2015-08-30 DIAGNOSIS — M2550 Pain in unspecified joint: Secondary | ICD-10-CM | POA: Diagnosis not present

## 2015-08-30 DIAGNOSIS — M0609 Rheumatoid arthritis without rheumatoid factor, multiple sites: Secondary | ICD-10-CM | POA: Diagnosis not present

## 2015-09-14 DIAGNOSIS — E782 Mixed hyperlipidemia: Secondary | ICD-10-CM | POA: Diagnosis not present

## 2015-09-14 DIAGNOSIS — R7301 Impaired fasting glucose: Secondary | ICD-10-CM | POA: Diagnosis not present

## 2015-09-14 DIAGNOSIS — I10 Essential (primary) hypertension: Secondary | ICD-10-CM | POA: Diagnosis not present

## 2015-09-19 DIAGNOSIS — I10 Essential (primary) hypertension: Secondary | ICD-10-CM | POA: Diagnosis not present

## 2015-09-19 DIAGNOSIS — E782 Mixed hyperlipidemia: Secondary | ICD-10-CM | POA: Diagnosis not present

## 2015-09-19 DIAGNOSIS — N401 Enlarged prostate with lower urinary tract symptoms: Secondary | ICD-10-CM | POA: Diagnosis not present

## 2015-09-19 DIAGNOSIS — Z91018 Allergy to other foods: Secondary | ICD-10-CM | POA: Diagnosis not present

## 2015-09-27 DIAGNOSIS — H2513 Age-related nuclear cataract, bilateral: Secondary | ICD-10-CM | POA: Diagnosis not present

## 2015-09-27 DIAGNOSIS — H5213 Myopia, bilateral: Secondary | ICD-10-CM | POA: Diagnosis not present

## 2015-09-27 DIAGNOSIS — H52203 Unspecified astigmatism, bilateral: Secondary | ICD-10-CM | POA: Diagnosis not present

## 2015-12-07 DIAGNOSIS — M545 Low back pain: Secondary | ICD-10-CM | POA: Diagnosis not present

## 2015-12-07 DIAGNOSIS — Z79899 Other long term (current) drug therapy: Secondary | ICD-10-CM | POA: Diagnosis not present

## 2015-12-07 DIAGNOSIS — M4800 Spinal stenosis, site unspecified: Secondary | ICD-10-CM | POA: Diagnosis not present

## 2015-12-07 DIAGNOSIS — M818 Other osteoporosis without current pathological fracture: Secondary | ICD-10-CM | POA: Diagnosis not present

## 2016-01-13 DIAGNOSIS — Z125 Encounter for screening for malignant neoplasm of prostate: Secondary | ICD-10-CM | POA: Diagnosis not present

## 2016-01-13 DIAGNOSIS — E782 Mixed hyperlipidemia: Secondary | ICD-10-CM | POA: Diagnosis not present

## 2016-01-13 DIAGNOSIS — R7301 Impaired fasting glucose: Secondary | ICD-10-CM | POA: Diagnosis not present

## 2016-01-18 DIAGNOSIS — E782 Mixed hyperlipidemia: Secondary | ICD-10-CM | POA: Diagnosis not present

## 2016-01-18 DIAGNOSIS — T7840XD Allergy, unspecified, subsequent encounter: Secondary | ICD-10-CM | POA: Diagnosis not present

## 2016-01-18 DIAGNOSIS — Z0001 Encounter for general adult medical examination with abnormal findings: Secondary | ICD-10-CM | POA: Diagnosis not present

## 2016-01-18 DIAGNOSIS — I10 Essential (primary) hypertension: Secondary | ICD-10-CM | POA: Diagnosis not present

## 2016-01-18 DIAGNOSIS — Z23 Encounter for immunization: Secondary | ICD-10-CM | POA: Diagnosis not present

## 2016-01-18 DIAGNOSIS — R7301 Impaired fasting glucose: Secondary | ICD-10-CM | POA: Diagnosis not present

## 2016-01-18 DIAGNOSIS — N401 Enlarged prostate with lower urinary tract symptoms: Secondary | ICD-10-CM | POA: Diagnosis not present

## 2016-02-14 DIAGNOSIS — L989 Disorder of the skin and subcutaneous tissue, unspecified: Secondary | ICD-10-CM | POA: Diagnosis not present

## 2016-02-28 DIAGNOSIS — M4726 Other spondylosis with radiculopathy, lumbar region: Secondary | ICD-10-CM | POA: Diagnosis not present

## 2016-02-28 DIAGNOSIS — M5416 Radiculopathy, lumbar region: Secondary | ICD-10-CM | POA: Diagnosis not present

## 2016-02-28 DIAGNOSIS — Z981 Arthrodesis status: Secondary | ICD-10-CM | POA: Diagnosis not present

## 2016-03-11 DIAGNOSIS — R0789 Other chest pain: Secondary | ICD-10-CM | POA: Diagnosis not present

## 2016-03-11 DIAGNOSIS — R061 Stridor: Secondary | ICD-10-CM | POA: Diagnosis not present

## 2016-03-11 DIAGNOSIS — I499 Cardiac arrhythmia, unspecified: Secondary | ICD-10-CM | POA: Diagnosis not present

## 2016-03-11 DIAGNOSIS — R000 Tachycardia, unspecified: Secondary | ICD-10-CM | POA: Diagnosis not present

## 2016-03-11 DIAGNOSIS — M542 Cervicalgia: Secondary | ICD-10-CM | POA: Diagnosis not present

## 2016-03-11 DIAGNOSIS — Z87891 Personal history of nicotine dependence: Secondary | ICD-10-CM | POA: Diagnosis not present

## 2016-03-11 DIAGNOSIS — R0602 Shortness of breath: Secondary | ICD-10-CM | POA: Diagnosis not present

## 2016-03-11 DIAGNOSIS — R079 Chest pain, unspecified: Secondary | ICD-10-CM | POA: Diagnosis not present

## 2016-03-11 DIAGNOSIS — I4891 Unspecified atrial fibrillation: Secondary | ICD-10-CM | POA: Diagnosis not present

## 2016-03-11 DIAGNOSIS — Z7901 Long term (current) use of anticoagulants: Secondary | ICD-10-CM | POA: Diagnosis not present

## 2016-03-11 DIAGNOSIS — I10 Essential (primary) hypertension: Secondary | ICD-10-CM | POA: Diagnosis not present

## 2016-03-12 ENCOUNTER — Telehealth: Payer: Self-pay | Admitting: Cardiovascular Disease

## 2016-03-12 NOTE — Telephone Encounter (Signed)
Patient was on vacation at [facility] and woke up in the middle of the night with chest pain.  He went to the hospital and was found to be in A fib. He was started on Xarelto and diltiazem, he underwent additional testing,etc. At discharge, he was told to f/u with his Cardiologist. Patient has copies of medical records from [facility] that he will bring to his visit. He will be seen with a PA on 03/14/16.

## 2016-03-12 NOTE — Telephone Encounter (Signed)
Pt was seen in the ER at  [facility]. They told him to be seen as soon as he got back here.Pt is and was in Atrial Fib.

## 2016-03-14 ENCOUNTER — Encounter: Payer: Self-pay | Admitting: Physician Assistant

## 2016-03-14 ENCOUNTER — Ambulatory Visit (INDEPENDENT_AMBULATORY_CARE_PROVIDER_SITE_OTHER): Payer: PPO | Admitting: Physician Assistant

## 2016-03-14 VITALS — BP 144/80 | HR 72 | Ht 69.0 in | Wt 192.4 lb

## 2016-03-14 DIAGNOSIS — I10 Essential (primary) hypertension: Secondary | ICD-10-CM | POA: Diagnosis not present

## 2016-03-14 DIAGNOSIS — E785 Hyperlipidemia, unspecified: Secondary | ICD-10-CM

## 2016-03-14 DIAGNOSIS — I480 Paroxysmal atrial fibrillation: Secondary | ICD-10-CM | POA: Diagnosis not present

## 2016-03-14 DIAGNOSIS — Q245 Malformation of coronary vessels: Secondary | ICD-10-CM

## 2016-03-14 DIAGNOSIS — Z8679 Personal history of other diseases of the circulatory system: Secondary | ICD-10-CM

## 2016-03-14 DIAGNOSIS — R079 Chest pain, unspecified: Secondary | ICD-10-CM

## 2016-03-14 LAB — CBC WITH DIFFERENTIAL/PLATELET
Basophils Absolute: 0 cells/uL (ref 0–200)
Basophils Relative: 0 %
Eosinophils Absolute: 0 cells/uL — ABNORMAL LOW (ref 15–500)
Eosinophils Relative: 0 %
HCT: 47.9 % (ref 38.5–50.0)
Hemoglobin: 16.8 g/dL (ref 13.2–17.1)
Lymphocytes Relative: 12 %
Lymphs Abs: 1428 cells/uL (ref 850–3900)
MCH: 36.1 pg — ABNORMAL HIGH (ref 27.0–33.0)
MCHC: 35.1 g/dL (ref 32.0–36.0)
MCV: 103 fL — ABNORMAL HIGH (ref 80.0–100.0)
MPV: 9.3 fL (ref 7.5–12.5)
Monocytes Absolute: 952 cells/uL — ABNORMAL HIGH (ref 200–950)
Monocytes Relative: 8 %
Neutro Abs: 9520 cells/uL — ABNORMAL HIGH (ref 1500–7800)
Neutrophils Relative %: 80 %
Platelets: 188 10*3/uL (ref 140–400)
RBC: 4.65 MIL/uL (ref 4.20–5.80)
RDW: 13.1 % (ref 11.0–15.0)
WBC: 11.9 10*3/uL — ABNORMAL HIGH (ref 3.8–10.8)

## 2016-03-14 MED ORDER — DILTIAZEM HCL ER COATED BEADS 180 MG PO CP24: 180.0000 mg | ORAL_CAPSULE | Freq: Every day | ORAL | 5 refills | Status: DC

## 2016-03-14 MED ORDER — RIVAROXABAN 20 MG PO TABS: 20.0000 mg | ORAL_TABLET | Freq: Every day | ORAL | 3 refills | Status: DC

## 2016-03-14 NOTE — Patient Instructions (Addendum)
Medication Instructions:  Your physician recommends that you continue on your current medications as directed. Please refer to the Current Medication list given to you today.  Labwork: CBC AT [facility] ON THE FIRST FLOOR  Testing/Procedures: Your physician has requested that you have an echocardiogram. Echocardiography is a painless test that uses sound waves to create images of your heart. It provides your doctor with information about the size and shape of your heart and how well your heart's chambers and valves are working. This procedure takes approximately one hour. There are no restrictions for this procedure. [place] has requested that you have a lexiscan myoview. For further information please visit [email or web address]. Please follow instruction sheet, as given.  Follow-Up: Your physician recommends that you schedule a follow-up appointment in: 4-6 WEEKS WITH DR Claiborne Billings  Any Other Special Instructions Will Be Listed Below (If Applicable).     If you need a refill on your cardiac medications before your next appointment, please call your pharmacy.

## 2016-03-14 NOTE — Progress Notes (Signed)
Cardiology Office Note    Date:  03/14/2016   ID:  [name], DOB Nov 01, 1945, MRN [id number]  PCP:  Wende Neighbors, MD  Cardiologist:  Dr. Claiborne Billings  Chief Complaint  Patient presents with  . Hospitalization Follow-up    seen for Dr. Claiborne Billings, recently admitted at OSH for new afib    History of Present Illness:  [name] is a 70 y.o. male with PMH of GERD, h/o viral myocarditis, hypertension, hyperlipidemia and polymyalgia rheumatica. He suffered an episode of viral myocarditis in 1989 at which time his ejection fraction was 25% which normalized on subsequent checks. He had a history of mild mitral valve prolapse. Cardiac catheterization performed in 2001 showed only mild mid systolic LAD bridging. He has been noted to have mild T-wave changes on his EKG. He was unable to tolerate Lipitor. He initially tolerated Crestor, however then developed significant myalgia. He has been able to tolerate Zetia 10 mg daily. He was then last seen in the office on 05/17/2015, at which time he was doing well. Previously, he had lumbar spine surgery by [name] in September 2016, one year follow-up was recommended  According to our phone note, apparently patient was on vacation at [facility] recently when he developed atrial fibrillation and was admitted at [facility]. He presents today for cardiology follow-up. Based on the outside records she brought, his CTA of the chest was negative for PE. He says he was discharged on rate control medication, however was still in atrial fibrillation when he left the emergency room. However today's EKG shows that he has already converted back to normal sinus rhythm, he continued to have diffuse T-wave inversion in the anterior lead. He also noted he has been having some intermittent chest discomfort as well. His TSH was checked at the outside hospital, we will request the records. I will also obtain a CBC to make sure he is not having any bleeding  issue. His CHA2DS2-Vasc score is 2, he will require chronic systemic anticoagulation as long as he can tolerate. He does have some mild bruising, however no frank bleeding issue. We plan to obtain a repeat echocardiogram to take a look at his EF and the left atrial size and also given the intermittent chest discomfort he has experienced recently, we will also obtain a Lexiscan Myoview as outpatient. He can follow-up with Dr. Claiborne Billings in 4-6 weeks.   Past Medical History:  Diagnosis Date  . Arthritis   . Digestive problems    on Prednisone prn for this issue- diarrhea  . Dysrhythmia    irregular due to Myocarditis- takes Atenolol, Norvasc  . GERD (gastroesophageal reflux disease)   . H/O viral myocarditis    25 years ago  . Head injury, closed, with concussion    Breif LOC  . Hyperlipidemia   . Mild mitral valve prolapse    per Dr Evette Georges notes  . Polymyalgia rheumatica ([facility])     Past Surgical History:  Procedure Laterality Date  . apendectomy  1965  . APPENDECTOMY    . bone spur Bilateral 1999   feet  . CARDIAC CATHETERIZATION  2001  . CHOLECYSTECTOMY  2004  . COLONOSCOPY    . LUMBAR LAMINECTOMY/ DECOMPRESSION WITH MET-RX Right 05/05/2013   Procedure: Right Lumbar three-four Extraforaminal Microdiskectomy with Metrex;  Surgeon: [name], MD;  Location: [place] [facility];  Service: Neurosurgery;  Laterality: Right;  Right Lumbar three-four Extraforaminal Microdiskectomy with Metrex  . SHOULDER OPEN ROTATOR  CUFF REPAIR Bilateral 2001  . TONSILLECTOMY      Current Medications: Outpatient Medications Prior to Visit  Medication Sig Dispense Refill  . acetaminophen (TYLENOL) 500 MG tablet Take 500 mg by mouth every 6 (six) hours as needed.    Marland Kitchen amLODipine (NORVASC) 2.5 MG tablet TAKE ONE (1) TABLET BY MOUTH EVERY DAY 30 tablet 10  . atenolol (TENORMIN) 25 MG tablet Take 1 tablet (25 mg total) by mouth 2 (two) times daily. 180 tablet 2  . cetirizine (ZYRTEC) 10 MG tablet Take 10 mg by  mouth daily as needed.     . Cholecalciferol (VITAMIN D3) 2000 units TABS Take 1 capsule by mouth daily.    Marland Kitchen ezetimibe (ZETIA) 10 MG tablet TAKE ONE (1) TABLET BY MOUTH EVERY DAY 30 tablet 10  . ondansetron (ZOFRAN) 4 MG tablet Take 4 mg by mouth every 8 (eight) hours as needed for nausea or vomiting.    Marland Kitchen aspirin 81 MG tablet Take 81 mg by mouth daily.     . naproxen sodium (ANAPROX) 220 MG tablet Take 440 mg by mouth daily as needed.     . celecoxib (CELEBREX) 200 MG capsule Take 200 mg by mouth daily.     No facility-administered medications prior to visit.      Allergies:   Lidocaine hcl; Codeine; Crestor [rosuvastatin calcium]; and Percocet [oxycodone-acetaminophen]   Social History   Social History  . Marital status: Married    Spouse name: N/A  . Number of children: N/A  . Years of education: N/A   Social History Main Topics  . Smoking status: Former Smoker    Years: 1.00    Types: Cigarettes    Quit date: 07/10/1971  . Smokeless tobacco: Never Used     Comment: Patient smoked 1/2 pack per week  . Alcohol use 8.4 oz/week    14 Glasses of wine per week     Comment: 1-2 glasses of wine with evening meal  . Drug use: No  . Sexual activity: Yes    Birth control/ protection: Other-see comments     Comment: old age   Other Topics Concern  . None   Social History Narrative  . None     Family History:  The patient's family history includes Heart disease in his father.   ROS:   Please see the history of present illness.    ROS All other systems reviewed and are negative.   PHYSICAL EXAM:   VS:  BP (!) 144/80   Pulse 72   Ht '5\' 9"'  (1.753 m)   Wt 192 lb 6.4 oz (87.3 kg)   BMI 28.41 kg/m    GEN: Well nourished, well developed, in no acute distress  HEENT: normal  Neck: no JVD, carotid bruits, or masses Cardiac: RRR; no murmurs, rubs, or gallops,no edema  Respiratory:  clear to auscultation bilaterally, normal work of breathing GI: soft, nontender,  nondistended, + BS MS: no deformity or atrophy  Skin: warm and dry, no rash Neuro:  Alert and Oriented x 3, Strength and sensation are intact Psych: euthymic mood, full affect  Wt Readings from Last 3 Encounters:  03/14/16 192 lb 6.4 oz (87.3 kg)  05/17/15 184 lb (83.5 kg)  02/01/15 183 lb (83 kg)      Studies/Labs Reviewed:   EKG:  EKG is ordered today.  The ekg ordered today demonstrates Normal sinus rhythm was chronic T-wave inversion in the anterior leads.  Recent Labs: No results found for requested  labs within last 8760 hours.   Lipid Panel No results found for: CHOL, TRIG, HDL, CHOLHDL, VLDL, LDLCALC, LDLDIRECT  Additional studies/ records that were reviewed today include:   CT of the chest obtained recently at outside hospital has been reviewed, no PE or cardiopulmonary issues.     ASSESSMENT:    1. Paroxysmal atrial fibrillation (HCC)   2. H/O viral myocarditis   3. Essential hypertension   4. Hyperlipidemia, unspecified hyperlipidemia type   5. Coronary-myocardial bridge   6. Intermittent chest pain      PLAN:  In order of problems listed above:   1. Paroxysmal atrial fibrillation  - This patients CHA2DS2-VASc Score and unadjusted Ischemic Stroke Rate (% per year) is equal to 2.2 % stroke rate/year from a score of 2  Above score calculated as 1 point each if present [CHF, HTN, DM, Vascular=MI/PAD/Aortic Plaque, Age if 65-74, or Male] Above score calculated as 2 points each if present [Age > 75, or Stroke/TIA/TE]  - continue on atenolol and diltiazem. Will obtain echocardiogram as outpatient. Will request her record regarding his recent TSH. Obtain CBC to make sure there is no sign of bleeding.  2. Intermittent chest pain  - Unclear cause, may be related to his mild myocardial bridging that was seen on the previous cardiac catheterization.  - Given new onset of paroxysmal atrial fibrillation, will obtain outpatient stress test.  3. History of viral  myocarditis: No sign of recurrence  4. Hypertension: Blood pressure high today at 144/80, however per wife, his systolic blood pressure at home is 120s.  5. Hyperlipidemia: unable to tolerate statin, on Zetia.    Medication Adjustments/Labs and Tests Ordered: Current medicines are reviewed at length with the patient today.  Concerns regarding medicines are outlined above.  Medication changes, Labs and Tests ordered today are listed in the Patient Instructions below. Patient Instructions  Medication Instructions:  Your physician recommends that you continue on your current medications as directed. Please refer to the Current Medication list given to you today.  Labwork: CBC AT [facility] ON THE FIRST FLOOR  Testing/Procedures: Your physician has requested that you have an echocardiogram. Echocardiography is a painless test that uses sound waves to create images of your heart. It provides your doctor with information about the size and shape of your heart and how well your heart's chambers and valves are working. This procedure takes approximately one hour. There are no restrictions for this procedure. [place] has requested that you have a lexiscan myoview. For further information please visit [email or web address]. Please follow instruction sheet, as given.  Follow-Up: Your physician recommends that you schedule a follow-up appointment in: 4-6 WEEKS WITH DR Claiborne Billings  Any Other Special Instructions Will Be Listed Below (If Applicable).     If you need a refill on your cardiac medications before your next appointment, please call your pharmacy.     Hilbert Corrigan, [place]  03/14/2016 4:02 PM    [facility] [place], [place], [place]  [address] Phone: [phone]; Fax: [phone]

## 2016-03-16 ENCOUNTER — Telehealth (HOSPITAL_COMMUNITY): Payer: Self-pay

## 2016-03-16 NOTE — Telephone Encounter (Signed)
Encounter complete. 

## 2016-03-21 ENCOUNTER — Ambulatory Visit (HOSPITAL_COMMUNITY)
Admission: RE | Admit: 2016-03-21 | Discharge: 2016-03-21 | Disposition: A | Discharge status: Home / Self Care | Payer: PPO | Source: Ambulatory Visit | Attending: Cardiovascular Disease | Admitting: Cardiovascular Disease

## 2016-03-21 DIAGNOSIS — I10 Essential (primary) hypertension: Secondary | ICD-10-CM | POA: Diagnosis not present

## 2016-03-21 DIAGNOSIS — E785 Hyperlipidemia, unspecified: Secondary | ICD-10-CM

## 2016-03-21 DIAGNOSIS — I480 Paroxysmal atrial fibrillation: Secondary | ICD-10-CM | POA: Diagnosis not present

## 2016-03-21 DIAGNOSIS — R079 Chest pain, unspecified: Secondary | ICD-10-CM | POA: Insufficient documentation

## 2016-03-21 DIAGNOSIS — Z8679 Personal history of other diseases of the circulatory system: Secondary | ICD-10-CM | POA: Diagnosis not present

## 2016-03-21 LAB — MYOCARDIAL PERFUSION IMAGING
LV dias vol: 86 mL (ref 62–150)
LV sys vol: 34 mL
Peak HR: 102 {beats}/min
Rest HR: 90 {beats}/min
SDS: 3
SRS: 1
SSS: 3
TID: 1.16

## 2016-03-21 MED ORDER — TECHNETIUM TC 99M TETROFOSMIN IV KIT
10.0000 | PACK | Freq: Once | INTRAVENOUS | Status: AC | PRN
  Administered 2016-03-21: 10 via INTRAVENOUS
  Filled 2016-03-21: qty 10

## 2016-03-21 MED ORDER — REGADENOSON 0.4 MG/5ML IV SOLN
0.4000 mg | Freq: Once | INTRAVENOUS | Status: AC
  Administered 2016-03-21: 0.4 mg via INTRAVENOUS

## 2016-03-21 MED ORDER — TECHNETIUM TC 99M TETROFOSMIN IV KIT
30.4000 | PACK | Freq: Once | INTRAVENOUS | Status: AC | PRN
  Administered 2016-03-21: 30.4 via INTRAVENOUS
  Filled 2016-03-21: qty 31

## 2016-03-22 ENCOUNTER — Ambulatory Visit (HOSPITAL_COMMUNITY): Payer: PPO | Attending: Cardiology

## 2016-03-22 ENCOUNTER — Telehealth: Payer: Self-pay | Admitting: Cardiovascular Disease

## 2016-03-22 ENCOUNTER — Other Ambulatory Visit: Payer: Self-pay

## 2016-03-22 DIAGNOSIS — E785 Hyperlipidemia, unspecified: Secondary | ICD-10-CM | POA: Diagnosis not present

## 2016-03-22 DIAGNOSIS — I480 Paroxysmal atrial fibrillation: Secondary | ICD-10-CM

## 2016-03-22 DIAGNOSIS — I10 Essential (primary) hypertension: Secondary | ICD-10-CM

## 2016-03-22 DIAGNOSIS — K219 Gastro-esophageal reflux disease without esophagitis: Secondary | ICD-10-CM | POA: Diagnosis not present

## 2016-03-22 DIAGNOSIS — Z8679 Personal history of other diseases of the circulatory system: Secondary | ICD-10-CM | POA: Diagnosis not present

## 2016-03-22 LAB — ECHOCARDIOGRAM COMPLETE
E decel time: 162 msec
E/e' ratio: 11.81
FS: 51 % — AB (ref 28–44)
IVS/LV PW RATIO, ED: 0.89
LA ID, A-P, ES: 32 mm
LA diam end sys: 32 mm
LA diam index: 1.58 cm/m2
LA vol A4C: 37 ml
LV E/e' medial: 11.81
LV E/e'average: 11.81
LV PW d: 10.6 mm — AB (ref 0.6–1.1)
LV e' LATERAL: 4.93 cm/s
LVOT SV: 67 mL
LVOT VTI: 17.7 cm
LVOT area: 3.8 cm2
LVOT diameter: 22 mm
LVOT peak grad rest: 3 mmHg
LVOT peak vel: 85 cm/s
MV Dec: 162
MV pk A vel: 68.1 m/s
MV pk E vel: 58.2 m/s
S' Lateral: 13.6 cm/s
TDI e' lateral: 4.93
TDI e' medial: 4.61

## 2016-03-22 NOTE — Telephone Encounter (Signed)
New message     FYI Pt had a echo/stress test recently.  He wanted to let us know that he will be out of the [place] starting nov 23 for 2 weeks in case further testing is needed or ov is needed.

## 2016-03-22 NOTE — Telephone Encounter (Signed)
Returned call to patient.  Gave him the result of the myoview. He verbalized understanding.  Echo was just done today and has not resulted yet. Advised we will notify him as soon as we can once results are read. He verbalized understanding and will keep his appt in December with Dr Claiborne Billings.

## 2016-03-26 ENCOUNTER — Telehealth: Payer: Self-pay | Admitting: Cardiovascular Disease

## 2016-03-26 NOTE — Telephone Encounter (Signed)
[name] is calling in reference to the new medication ( Diltiazem) states that he is not doing well on with it. Please call   Thanks

## 2016-03-26 NOTE — Telephone Encounter (Signed)
Spoke with pt states that he has been taking diltiazem 180mg  for 2 weeks, also started Xarelto 20 mg  and he states that he has been having body aches, fatigue and SOB <50 feet walking upon exertion. Pt denies at rest SOB or chest pain or pressure or any other symptoms. BP is running 120's/70-80's pulse 70-80. Appt scheduled tomorrow with PA Sharolyn Douglas.

## 2016-03-28 ENCOUNTER — Ambulatory Visit: Payer: PPO | Admitting: Nurse Practitioner

## 2016-03-28 ENCOUNTER — Telehealth: Payer: Self-pay | Admitting: Physician Assistant

## 2016-03-28 ENCOUNTER — Encounter: Payer: Self-pay | Admitting: *Deleted

## 2016-03-28 ENCOUNTER — Encounter: Payer: Self-pay | Admitting: Physician Assistant

## 2016-03-28 ENCOUNTER — Ambulatory Visit (INDEPENDENT_AMBULATORY_CARE_PROVIDER_SITE_OTHER): Payer: PPO | Admitting: Physician Assistant

## 2016-03-28 VITALS — BP 130/76 | HR 112 | Ht 69.0 in | Wt 187.0 lb

## 2016-03-28 DIAGNOSIS — I10 Essential (primary) hypertension: Secondary | ICD-10-CM | POA: Diagnosis not present

## 2016-03-28 DIAGNOSIS — Z8679 Personal history of other diseases of the circulatory system: Secondary | ICD-10-CM

## 2016-03-28 DIAGNOSIS — Q245 Malformation of coronary vessels: Secondary | ICD-10-CM

## 2016-03-28 DIAGNOSIS — I480 Paroxysmal atrial fibrillation: Secondary | ICD-10-CM | POA: Diagnosis not present

## 2016-03-28 DIAGNOSIS — Z79899 Other long term (current) drug therapy: Secondary | ICD-10-CM

## 2016-03-28 DIAGNOSIS — E785 Hyperlipidemia, unspecified: Secondary | ICD-10-CM

## 2016-03-28 LAB — CBC
HCT: 44.8 % (ref 38.5–50.0)
Hemoglobin: 15.5 g/dL (ref 13.2–17.1)
MCH: 35.3 pg — ABNORMAL HIGH (ref 27.0–33.0)
MCHC: 34.6 g/dL (ref 32.0–36.0)
MCV: 102.1 fL — ABNORMAL HIGH (ref 80.0–100.0)
MPV: 8.9 fL (ref 7.5–12.5)
Platelets: 212 10*3/uL (ref 140–400)
RBC: 4.39 MIL/uL (ref 4.20–5.80)
RDW: 13.1 % (ref 11.0–15.0)
WBC: 11.2 10*3/uL — ABNORMAL HIGH (ref 3.8–10.8)

## 2016-03-28 MED ORDER — ATENOLOL 50 MG PO TABS: 50.0000 mg | ORAL_TABLET | Freq: Two times a day (BID) | ORAL | 11 refills | Status: DC

## 2016-03-28 NOTE — Telephone Encounter (Signed)
Spoke with pharmacist to give pt Atenolol 25 mg, pt will take 2 tablets daily

## 2016-03-28 NOTE — Patient Instructions (Addendum)
Medication Instructions:  INCREASE- Atenolol 50 mg twice a day STOP - Diltiazem  Labwork: CBC today  Testing/Procedures: None Ordered  Follow-Up: Your physician recommends that you schedule a follow-up appointment in: Keep appointment with Dr Claiborne Billings on December 13th @ 8:45 am    Any Other Special Instructions Will Be Listed Below (If Applicable).         Happy Thanksgiving  If you need a refill on your cardiac medications before your next appointment, please call your pharmacy.

## 2016-03-28 NOTE — Telephone Encounter (Signed)
New message  Pharm called, RX states 50mg , pharm can only get 25 or 100mg   Pharm wants to know if they can convert mg's  Please call back

## 2016-03-28 NOTE — Progress Notes (Signed)
Cardiology Office Note    Date:  03/28/2016   ID:  [name], DOB Apr 13, 1946, MRN [id number]  PCP:  Wende Neighbors, MD  Cardiologist:  Dr. Claiborne Billings  Chief Complaint  Patient presents with  . Follow-up    seen for Dr. Claiborne Billings, seen for DOE and fatigue    History of Present Illness:  [name] is a 70 y.o. male with PMH of GERD, h/o viral myocarditis, hypertension, hyperlipidemia and polymyalgia rheumatica. He suffered an episode of viral myocarditis in 1989 at which time his ejection fraction was 25% which normalized on subsequent checks. He had a history of mild mitral valve prolapse. Cardiac catheterization performed in 2001 showed only mild mid systolic LAD bridging. He has been noted to have mild T-wave changes on his EKG. He was unable to tolerate Lipitor. He initially tolerated Crestor, however then developed significant myalgia. He has been able to tolerate Zetia 10 mg daily. He was then last seen in the office on 05/17/2015, at which time he was doing well. Previously, he had lumbar spine surgery by [name] in September 2016, one year follow-up was recommended.   I last saw the patient on 03/14/2016, he was on vacation at [facility] when he developed new atrial fibrillation and was admitted at [facility]. He presented to the office on 03/14/2016 for cardiology visit, EKG obtained in the office shows yard he converted back to sinus rhythm. He continued to have diffuse T-wave inversion in the anterior lead at the time. His CHA2DS2-Vasc score was 2. He denies any prior bleeding issues, given the fact he was compliant with atenolol 3m BID and still went into afib, I placed him on diltiazem 180 mg daily and Xarelto 20 mg daily. CBC obtained on 11/1 was normal without any sign of anemia. Echocardiogram obtained on 03/22/2016 showed EF 609-73% grade 1 diastolic dysfunction, mildly dilated aortic root measuring 38 mm. Myoview obtained also shows normal EF without any sign  of ischemia.  After started on the diltiazem, he has significant shortness of breath and weakness, he was having shortness breath walking only a short distance. He has been compliant with Xarelto and has not noticed any significant bleeding. He did stop his diltiazem 2 days ago, yesterday, he had an episode of palpitation and his blood pressure dropped down to 953Gsystolic. I do not think his current dose of atenolol is controlling his symptom, however unclear cause for the shortness breath and weakness. He did say the symptom has significantly improved after he stopped the diltiazem, I think the symptom is likely side effect from diltiazem. I will increase his atenolol to 50 mg twice a day as his current dose is not controlling his heart rate and it appears he is having recurrence of atrial fibrillation. Fortunately he is compliant with Xarelto. I will also check a CBC today to make sure that he is not having significant anemia and also follow-up on the mildly elevated white blood cell count on the last CBC. Risk of PE very low as he is on Xarelto.    Past Medical History:  Diagnosis Date  . Arthritis   . Digestive problems    on Prednisone prn for this issue- diarrhea  . Dysrhythmia    irregular due to Myocarditis- takes Atenolol, Norvasc  . GERD (gastroesophageal reflux disease)   . H/O viral myocarditis    25 years ago  . Head injury, closed, with concussion    Breif LOC  .  Hyperlipidemia   . Mild mitral valve prolapse    per Dr Evette Georges notes  . Polymyalgia rheumatica (HCC)     Past Surgical History:  Procedure Laterality Date  . apendectomy  1965  . APPENDECTOMY    . bone spur Bilateral 1999   feet  . CARDIAC CATHETERIZATION  2001  . CHOLECYSTECTOMY  2004  . COLONOSCOPY    . LUMBAR LAMINECTOMY/ DECOMPRESSION WITH MET-RX Right 05/05/2013   Procedure: Right Lumbar three-four Extraforaminal Microdiskectomy with Metrex;  Surgeon: [name], MD;  Location: [facility];  Service:  Neurosurgery;  Laterality: Right;  Right Lumbar three-four Extraforaminal Microdiskectomy with Metrex  . SHOULDER OPEN ROTATOR CUFF REPAIR Bilateral 2001  . TONSILLECTOMY      Current Medications: Outpatient Medications Prior to Visit  Medication Sig Dispense Refill  . acetaminophen (TYLENOL) 500 MG tablet Take 500 mg by mouth every 6 (six) hours as needed.    Marland Kitchen alendronate (FOSAMAX) 35 MG tablet Take 1 tablet by mouth once a week.    Marland Kitchen amLODipine (NORVASC) 2.5 MG tablet TAKE ONE (1) TABLET BY MOUTH EVERY DAY 30 tablet 10  . cetirizine (ZYRTEC) 10 MG tablet Take 10 mg by mouth daily as needed.     . Cholecalciferol (VITAMIN D3) 2000 units TABS Take 1 capsule by mouth daily.    Marland Kitchen ezetimibe (ZETIA) 10 MG tablet TAKE ONE (1) TABLET BY MOUTH EVERY DAY 30 tablet 10  . ondansetron (ZOFRAN) 4 MG tablet Take 4 mg by mouth every 8 (eight) hours as needed for nausea or vomiting.    . rivaroxaban (XARELTO) 20 MG TABS tablet Take 1 tablet (20 mg total) by mouth daily with supper. 30 tablet 3  . atenolol (TENORMIN) 25 MG tablet Take 1 tablet (25 mg total) by mouth 2 (two) times daily. 180 tablet 2  . diltiazem (CARTIA XT) 180 MG 24 hr capsule Take 1 capsule (180 mg total) by mouth daily. 30 capsule 5   No facility-administered medications prior to visit.      Allergies:   Diltiazem hcl er coated beads; Lidocaine hcl; Codeine; Crestor [rosuvastatin calcium]; and Percocet [oxycodone-acetaminophen]   Social History   Social History  . Marital status: Married    Spouse name: N/A  . Number of children: N/A  . Years of education: N/A   Social History Main Topics  . Smoking status: Former Smoker    Years: 1.00    Types: Cigarettes    Quit date: 07/10/1971  . Smokeless tobacco: Never Used     Comment: Patient smoked 1/2 pack per week  . Alcohol use 8.4 oz/week    14 Glasses of wine per week     Comment: 1-2 glasses of wine with evening meal  . Drug use: No  . Sexual activity: Yes    Birth  control/ protection: Other-see comments     Comment: old age   Other Topics Concern  . None   Social History Narrative  . None     Family History:  The patient's family history includes Heart disease in his father.   ROS:   Please see the history of present illness.    ROS All other systems reviewed and are negative.   PHYSICAL EXAM:   VS:  BP 130/76   Pulse (!) 112   Ht '5\' 9"'  (1.753 m)   Wt 187 lb (84.8 kg)   BMI 27.62 kg/m    GEN: Well nourished, well developed, in no acute distress  HEENT: normal  Neck: no JVD, carotid bruits, or masses Cardiac: Tachycardic, regular; no murmurs, rubs, or gallops,no edema  Respiratory:  clear to auscultation bilaterally, normal work of breathing GI: soft, nontender, nondistended, + BS MS: no deformity or atrophy  Skin: warm and dry, no rash Neuro:  Alert and Oriented x 3, Strength and sensation are intact Psych: euthymic mood, full affect  Wt Readings from Last 3 Encounters:  03/28/16 187 lb (84.8 kg)  03/21/16 192 lb (87.1 kg)  03/14/16 192 lb 6.4 oz (87.3 kg)      Studies/Labs Reviewed:   EKG:  EKG is ordered today.  The ekg ordered today demonstrates Mild sinus tachycardia with heart rate 105, diffuse T-wave inversion in the anterior leads, unchanged compared to that. His EKG.  Recent Labs: 03/14/2016: Hemoglobin 16.8; Platelets 188   Lipid Panel No results found for: CHOL, TRIG, HDL, CHOLHDL, VLDL, LDLCALC, LDLDIRECT  Additional studies/ records that were reviewed today include:   CT of the chest obtained recently at outside hospital has been reviewed, no PE or cardiopulmonary issues.  Myoview 03/21/2016 Study Highlights    The left ventricular ejection fraction is normal (55-65%).  Nuclear stress EF: 61%.  The study is normal.  This is a low risk study.  There was no ST segment deviation noted during stress.     Echo 03/22/2016 LV EF: 60% -   65%  - Left ventricle: The cavity size was normal. Wall  thickness was   normal. Systolic function was normal. The estimated ejection   fraction was in the range of 60% to 65%. Wall motion was normal;   there were no regional wall motion abnormalities. Doppler   parameters are consistent with abnormal left ventricular   relaxation (grade 1 diastolic dysfunction). - Aortic valve: There was no stenosis. - Aorta: Mildly dilated aortic root. Aortic root dimension: 38 mm   (ED). - Mitral valve: There was no significant regurgitation. - Right ventricle: The cavity size was normal. Systolic function   was normal. - Pulmonary arteries: No complete TR doppler jet so unable to   estimate PA systolic pressure. - Inferior vena cava: The vessel was normal in size. The   respirophasic diameter changes were in the normal range (>= 50%),   consistent with normal central venous pressure.  Impressions:  - Normal LV size with EF 60-65%. Normal RV size and systolic   function. No significant valvular abnormalities.    ASSESSMENT:    1. Paroxysmal atrial fibrillation (HCC)   2. Medication management   3. Essential hypertension   4. H/O viral myocarditis   5. Hyperlipidemia, unspecified hyperlipidemia type   6. Coronary-myocardial bridge      PLAN:  In order of problems listed above:  1. Paroxysmal atrial fibrillation             - This patients CHA2DS2-VASc Score and unadjusted Ischemic Stroke Rate (% per year) is equal to 2.2 % stroke rate/year from a score of 2  Above score calculated as 1 point each if present [CHF, HTN, DM, Vascular=MI/PAD/Aortic Plaque, Age if 65-74, or Male] Above score calculated as 2 points each if present [Age > 75, or Stroke/TIA/TE]             - Could not tolerate the diltiazem. His recent TSH obtained on 03/11/2016 was normal. He has been compliant with was Xarelto. Without the diltiazem, just on 25 mg twice a day of atenolol, he was still having recurrent palpitations. Therefore we have increased his atenolol  to 50  mg twice a day. Recent echo reviewed, normal EF, left atrial size normal.  2. Intermittent chest pain             - echocardiogram and Myoview were negative. He did have mildly dilated aortic root, we will continue to observe this time. Patient has been informed of results.  3. History of viral myocarditis: No sign of recurrence  4. Hypertension: Blood pressure 130s today, will increase atenolol to 50 mg twice a day.  5. Hyperlipidemia: unable to tolerate statin, on Zetia.    Medication Adjustments/Labs and Tests Ordered: Current medicines are reviewed at length with the patient today.  Concerns regarding medicines are outlined above.  Medication changes, Labs and Tests ordered today are listed in the Patient Instructions below. Patient Instructions  Medication Instructions:  INCREASE- Atenolol 50 mg twice a day STOP - Diltiazem  Labwork: CBC today  Testing/Procedures: None Ordered  Follow-Up: Your physician recommends that you schedule a follow-up appointment in: Keep appointment with Dr Claiborne Billings on December 13th @ 8:45 am    Any Other Special Instructions Will Be Listed Below (If Applicable).         Happy Thanksgiving  If you need a refill on your cardiac medications before your next appointment, please call your pharmacy.      Hilbert Corrigan, [place]  03/28/2016 12:09 PM    [address], [place], [place]  [address] Phone: [phone]; Fax: [phone]

## 2016-04-02 ENCOUNTER — Ambulatory Visit (INDEPENDENT_AMBULATORY_CARE_PROVIDER_SITE_OTHER): Payer: PPO | Admitting: Physician Assistant

## 2016-04-02 ENCOUNTER — Ambulatory Visit (INDEPENDENT_AMBULATORY_CARE_PROVIDER_SITE_OTHER)
Admission: RE | Admit: 2016-04-02 | Discharge: 2016-04-02 | Disposition: A | Discharge status: Home / Self Care | Payer: PPO | Source: Ambulatory Visit | Attending: Physician Assistant | Admitting: Physician Assistant

## 2016-04-02 ENCOUNTER — Encounter: Payer: Self-pay | Admitting: Physician Assistant

## 2016-04-02 VITALS — BP 142/89 | HR 112 | Ht 69.0 in | Wt 186.0 lb

## 2016-04-02 DIAGNOSIS — E785 Hyperlipidemia, unspecified: Secondary | ICD-10-CM

## 2016-04-02 DIAGNOSIS — R0602 Shortness of breath: Secondary | ICD-10-CM | POA: Diagnosis not present

## 2016-04-02 DIAGNOSIS — R05 Cough: Secondary | ICD-10-CM | POA: Diagnosis not present

## 2016-04-02 DIAGNOSIS — R059 Cough, unspecified: Secondary | ICD-10-CM

## 2016-04-02 DIAGNOSIS — E8770 Fluid overload, unspecified: Secondary | ICD-10-CM

## 2016-04-02 DIAGNOSIS — I480 Paroxysmal atrial fibrillation: Secondary | ICD-10-CM

## 2016-04-02 DIAGNOSIS — I10 Essential (primary) hypertension: Secondary | ICD-10-CM

## 2016-04-02 DIAGNOSIS — Z79899 Other long term (current) drug therapy: Secondary | ICD-10-CM | POA: Diagnosis not present

## 2016-04-02 DIAGNOSIS — Z8679 Personal history of other diseases of the circulatory system: Secondary | ICD-10-CM

## 2016-04-02 DIAGNOSIS — R718 Other abnormality of red blood cells: Secondary | ICD-10-CM

## 2016-04-02 DIAGNOSIS — J9811 Atelectasis: Secondary | ICD-10-CM | POA: Diagnosis not present

## 2016-04-02 DIAGNOSIS — Q245 Malformation of coronary vessels: Secondary | ICD-10-CM

## 2016-04-02 LAB — CBC
HCT: 46 % (ref 38.5–50.0)
Hemoglobin: 16.2 g/dL (ref 13.2–17.1)
MCH: 36.2 pg — ABNORMAL HIGH (ref 27.0–33.0)
MCHC: 35.2 g/dL (ref 32.0–36.0)
MCV: 102.7 fL — ABNORMAL HIGH (ref 80.0–100.0)
MPV: 9 fL (ref 7.5–12.5)
Platelets: 304 10*3/uL (ref 140–400)
RBC: 4.48 MIL/uL (ref 4.20–5.80)
RDW: 13.1 % (ref 11.0–15.0)
WBC: 9.7 10*3/uL (ref 3.8–10.8)

## 2016-04-02 LAB — BASIC METABOLIC PANEL
BUN: 7 mg/dL (ref 7–25)
CO2: 29 mmol/L (ref 20–31)
Calcium: 9.1 mg/dL (ref 8.6–10.3)
Chloride: 101 mmol/L (ref 98–110)
Creat: 0.91 mg/dL (ref 0.70–1.25)
Glucose, Bld: 132 mg/dL — ABNORMAL HIGH (ref 65–99)
Potassium: 4.7 mmol/L (ref 3.5–5.3)
Sodium: 137 mmol/L (ref 135–146)

## 2016-04-02 LAB — FOLATE: Folate: 17 ng/mL (ref >5.4)

## 2016-04-02 LAB — TROPONIN I: Troponin I: <0.01 ng/mL (ref <=0.05)

## 2016-04-02 LAB — VITAMIN B12: Vitamin B-12: 471 pg/mL (ref 200–1100)

## 2016-04-02 MED ORDER — NEBIVOLOL HCL 5 MG PO TABS: 5.0000 mg | ORAL_TABLET | Freq: Every day | ORAL | 11 refills | Status: DC

## 2016-04-02 MED ORDER — APIXABAN 5 MG PO TABS: 5.0000 mg | ORAL_TABLET | Freq: Two times a day (BID) | ORAL | 11 refills | Status: DC

## 2016-04-02 NOTE — Patient Instructions (Addendum)
Medication Instructions:  STOP- Atenolol and Xarelto START- Eliquis 5 mg twice a day and Bystolic 5 mg daily  Labwork: CBC, BMP, B12, Folate ESR, CRP and Troponin  Testing/Procedures: Non-Cardiac CT scanning, (CAT scanning), is a noninvasive, special x-ray that produces cross-sectional images of the body using x-rays and a computer. CT scans help physicians diagnose and treat medical conditions. For some CT exams, a contrast material is used to enhance visibility in the area of the body being studied. CT scans provide greater clarity and reveal more details than regular x-ray exams.  Follow-Up: Your physician recommends that you schedule a follow-up appointment in: Next Tuesday with Dr Claiborne Billings @ 4:00 pm   Any Other Special Instructions Will Be Listed Below (If Applicable).     If you need a refill on your cardiac medications before your next appointment, please call your pharmacy.

## 2016-04-02 NOTE — Progress Notes (Signed)
Cardiology Office Note    Date:  04/02/2016   ID:  [name], DOB December 20, 1945, MRN [id number]  PCP:  Wende Neighbors, MD  Cardiologist:  Dr. Claiborne Billings  Chief Complaint  Patient presents with  . Follow-up  . Shortness of Breath    frequently  . Fatigue    frequently    History of Present Illness:  [name] is a 70 y.o. male with PMH of GERD, h/o viral myocarditis, hypertension, hyperlipidemia and polymyalgia rheumatica. He suffered an episode of viral myocarditis in 1989 at which time his ejection fraction was 25% which normalized on subsequent checks. He had a history of mild mitral valve prolapse. Cardiac catheterization performed in 2001 showed only mild mid systolic LAD bridging. He has been noted to have mild T-wave changes on his EKG. He was unable to tolerate Lipitor. He initially tolerated Crestor, however then developed significant myalgia. He has been able to tolerate Zetia 10 mg daily. He was then last seen in the office on 05/17/2015, at which time he was doing well. Previously, he had lumbar spine surgery by [name] in September 2016, one year follow-up was recommended.  I last saw the patient on 03/14/2016, he was on vacation at [place] when he developed new atrial fibrillation and was admitted at [facility]. He presented to the office on 03/14/2016 for cardiology visit, EKG obtained in the office shows yard he converted back to sinus rhythm. He continued to have diffuse T-wave inversion in the anterior lead at the time. His CHA2DS2-Vasc score was 2. He denies any prior bleeding issues, given the fact he was compliant with atenolol 70m BID and still went into afib, I placed him on diltiazem 180 mg daily and Xarelto 20 mg daily. CBC obtained on 11/1 was normal without any sign of anemia. Echocardiogram obtained on 03/22/2016 showed EF 644-31% grade 1 diastolic dysfunction, mildly dilated aortic root measuring 38 mm. Myoview obtained also shows normal EF  without any sign of ischemia.  Patient presented to cardiology office on 03/28/2016, he was complaining of some fatigue and shortness of breath. He think it was related to the diltiazem, and actually stopping the diltiazem made it feel better. Therefore I have discontinued his diltiazem and increase his atenolol to 50 mg twice a day. However since switching to atenolol, he has not noticed any significant benefit. He presents today continued to complain significant fatigue and shortness of breath both at rest and with exertion. His chance of PE is very low, however I was unable to explain why he was having sinus tachycardia with heart rate of 110. I have discussed with Dr. KClaiborne Billings who is the patient's primary cardiologist, due to the nonspecific characteristic of his symptom, we plan to obtain standard labs and hemorrhage to rule out cardiopulmonary issue and rheumatology issue as well.   Past Medical History:  Diagnosis Date  . Arthritis   . Digestive problems    on Prednisone prn for this issue- diarrhea  . Dysrhythmia    irregular due to Myocarditis- takes Atenolol, Norvasc  . GERD (gastroesophageal reflux disease)   . H/O viral myocarditis    25 years ago  . Head injury, closed, with concussion    Breif LOC  . Hyperlipidemia   . Mild mitral valve prolapse    per Dr KEvette Georgesnotes  . Polymyalgia rheumatica (HCC)     Past Surgical History:  Procedure Laterality Date  . apendectomy  1965  . APPENDECTOMY    .  bone spur Bilateral 1999   feet  . CARDIAC CATHETERIZATION  2001  . CHOLECYSTECTOMY  2004  . COLONOSCOPY    . LUMBAR LAMINECTOMY/ DECOMPRESSION WITH MET-RX Right 05/05/2013   Procedure: Right Lumbar three-four Extraforaminal Microdiskectomy with Metrex;  Surgeon: [name], MD;  Location: [facility];  Service: Neurosurgery;  Laterality: Right;  Right Lumbar three-four Extraforaminal Microdiskectomy with Metrex  . SHOULDER OPEN ROTATOR CUFF REPAIR Bilateral 2001  .  TONSILLECTOMY      Current Medications: Outpatient Medications Prior to Visit  Medication Sig Dispense Refill  . acetaminophen (TYLENOL) 500 MG tablet Take 500 mg by mouth every 6 (six) hours as needed.    Marland Kitchen alendronate (FOSAMAX) 35 MG tablet Take 1 tablet by mouth once a week.    Marland Kitchen amLODipine (NORVASC) 2.5 MG tablet TAKE ONE (1) TABLET BY MOUTH EVERY DAY 30 tablet 10  . cetirizine (ZYRTEC) 10 MG tablet Take 10 mg by mouth daily as needed.     . Cholecalciferol (VITAMIN D3) 2000 units TABS Take 1 capsule by mouth daily.    Marland Kitchen ezetimibe (ZETIA) 10 MG tablet TAKE ONE (1) TABLET BY MOUTH EVERY DAY 30 tablet 10  . ondansetron (ZOFRAN) 4 MG tablet Take 4 mg by mouth every 8 (eight) hours as needed for nausea or vomiting.    . rivaroxaban (XARELTO) 20 MG TABS tablet Take 1 tablet (20 mg total) by mouth daily with supper. 30 tablet 3  . atenolol (TENORMIN) 50 MG tablet Take 1 tablet (50 mg total) by mouth 2 (two) times daily. 60 tablet 11   No facility-administered medications prior to visit.      Allergies:   Diltiazem hcl er coated beads; Lidocaine hcl; Codeine; Crestor [rosuvastatin calcium]; and Percocet [oxycodone-acetaminophen]   Social History   Social History  . Marital status: Married    Spouse name: N/A  . Number of children: N/A  . Years of education: N/A   Social History Main Topics  . Smoking status: Former Smoker    Years: 1.00    Types: Cigarettes    Quit date: 07/10/1971  . Smokeless tobacco: Never Used     Comment: Patient smoked 1/2 pack per week  . Alcohol use 8.4 oz/week    14 Glasses of wine per week     Comment: 1-2 glasses of wine with evening meal  . Drug use: No  . Sexual activity: Yes    Birth control/ protection: Other-see comments     Comment: old age   Other Topics Concern  . None   Social History Narrative  . None     Family History:  The patient's family history includes Heart disease in his father.   ROS:   Please see the history of present  illness.    ROS All other systems reviewed and are negative.   PHYSICAL EXAM:   VS:  BP (!) 142/89   Pulse (!) 112   Ht '5\' 9"'  (1.753 m)   Wt 186 lb (84.4 kg)   BMI 27.47 kg/m    GEN: Well nourished, well developed, in no acute distress  HEENT: normal  Neck: no JVD, carotid bruits, or masses Cardiac: RRR; no murmurs, rubs, or gallops,no edema  Respiratory:  clear to auscultation bilaterally, normal work of breathing GI: soft, nontender, nondistended, + BS MS: no deformity or atrophy  Skin: warm and dry, no rash Neuro:  Alert and Oriented x 3, Strength and sensation are intact Psych: euthymic mood, full affect  Wt  Readings from Last 3 Encounters:  04/02/16 186 lb (84.4 kg)  03/28/16 187 lb (84.8 kg)  03/21/16 192 lb (87.1 kg)      Studies/Labs Reviewed:   EKG:  EKG is ordered today.  The ekg ordered today demonstrates normal sinus rhythm with nonspecific ST-T wave changes.  Recent Labs: 03/28/2016: Hemoglobin 15.5; Platelets 212   Lipid Panel No results found for: CHOL, TRIG, HDL, CHOLHDL, VLDL, LDLCALC, LDLDIRECT  Additional studies/ records that were reviewed today include:   CT of the chest obtained recently at outside hospital has been reviewed, no PE or cardiopulmonary issues.  Myoview 03/21/2016 Study Highlights    The left ventricular ejection fraction is normal (55-65%).  Nuclear stress EF: 61%.  The study is normal.  This is a low risk study.  There was no ST segment deviation noted during stress.     Echo 03/22/2016 LV EF: 60% - 65%  - Left ventricle: The cavity size was normal. Wall thickness was normal. Systolic function was normal. The estimated ejection fraction was in the range of 60% to 65%. Wall motion was normal; there were no regional wall motion abnormalities. Doppler parameters are consistent with abnormal left ventricular relaxation (grade 1 diastolic dysfunction). - Aortic valve: There was no stenosis. - Aorta:  Mildly dilated aortic root. Aortic root dimension: 38 mm (ED). - Mitral valve: There was no significant regurgitation. - Right ventricle: The cavity size was normal. Systolic function was normal. - Pulmonary arteries: No complete TR doppler jet so unable to estimate PA systolic pressure. - Inferior vena cava: The vessel was normal in size. The respirophasic diameter changes were in the normal range (>= 50%), consistent with normal central venous pressure.  Impressions:  - Normal LV size with EF 60-65%. Normal RV size and systolic function. No significant valvular abnormalities.    ASSESSMENT:    1. SOB (shortness of breath)   2. Hypervolemia, unspecified hypervolemia type   3. Elevated MCV   4. Cough   5. Medication management   6. PAF (paroxysmal atrial fibrillation) ([place])   7. Essential hypertension   8. H/O viral myocarditis   9. Hyperlipidemia, unspecified hyperlipidemia type   10. Coronary-myocardial bridge      PLAN:  In order of problems listed above:  1. Shortness of breath both at rest and with exertion: He has no lower extremity edema, however he has prominent crackles in bilateral bases. He doesn't appears to be fluid overloaded on physical exam. He does have a nonproductive cough. He also had borderline elevated white blood cell count as well. I plan to obtain a CT of the chest to rule out acute etiology.  - He attributed some of his symptom to recent medication changes. Given his symptom, I have transition him from previous Xarelto to eliquis 5 mg twice a day. Dr. Claiborne Billings also recommended switching to Bystolic which has less of the fatigue symptom.  - I have also discussed with the patient and his wife and urged them to go to the emergency room if he has any continuous deterioration of his symptom.  2. Profound weakness: I have discussed with Dr. Claiborne Billings, who is also the patient's thymic cardiologist. His symptom is very atypical and does not suggest any  1 diagnosis. We plan to obtain CBC, BMP, BNP.   3. Elevated MCV: Dr. Claiborne Billings recommended also checking B-12 and folate. He does not appears to be severely anemic.  4. H/o myocarditis: Remote history, plan to obtain a stat troponin to rule  out possibility of pericarditis and myocarditis.    Medication Adjustments/Labs and Tests Ordered: Current medicines are reviewed at length with the patient today.  Concerns regarding medicines are outlined above.  Medication changes, Labs and Tests ordered today are listed in the Patient Instructions below. Patient Instructions  Medication Instructions:  STOP- Atenolol and Xarelto START- Eliquis 5 mg twice a day and Bystolic 5 mg daily  Labwork: CBC, BMP, B12, Folate ESR, CRP and Troponin  Testing/Procedures: Non-Cardiac CT scanning, (CAT scanning), is a noninvasive, special x-ray that produces cross-sectional images of the body using x-rays and a computer. CT scans help physicians diagnose and treat medical conditions. For some CT exams, a contrast material is used to enhance visibility in the area of the body being studied. CT scans provide greater clarity and reveal more details than regular x-ray exams.  Follow-Up: Your physician recommends that you schedule a follow-up appointment in: Next Tuesday with Dr Claiborne Billings @ 4:00 pm   Any Other Special Instructions Will Be Listed Below (If Applicable).     If you need a refill on your cardiac medications before your next appointment, please call your pharmacy.      [name], [place]  04/02/2016 11:27 PM    [address], [place], [place]  [address] Phone: [phone]; Fax: [phone]

## 2016-04-03 ENCOUNTER — Telehealth: Payer: Self-pay | Admitting: Physician Assistant

## 2016-04-03 DIAGNOSIS — D72829 Elevated white blood cell count, unspecified: Secondary | ICD-10-CM | POA: Diagnosis not present

## 2016-04-03 DIAGNOSIS — I4891 Unspecified atrial fibrillation: Secondary | ICD-10-CM | POA: Diagnosis not present

## 2016-04-03 DIAGNOSIS — R0602 Shortness of breath: Secondary | ICD-10-CM | POA: Diagnosis not present

## 2016-04-03 DIAGNOSIS — R509 Fever, unspecified: Secondary | ICD-10-CM | POA: Diagnosis not present

## 2016-04-03 LAB — BRAIN NATRIURETIC PEPTIDE: Brain Natriuretic Peptide: 53 pg/mL (ref <100)

## 2016-04-03 LAB — SEDIMENTATION RATE: Sed Rate: 61 mm/hr — ABNORMAL HIGH (ref 0–20)

## 2016-04-03 LAB — C-REACTIVE PROTEIN: CRP: 139 mg/L — ABNORMAL HIGH (ref <8.0)

## 2016-04-03 NOTE — Telephone Encounter (Signed)
Patient of Dr. Claiborne Billings - seen by [facility] yesterday. A chest CT was ordered, images for which are pending read. Pt had been reporting symptoms of dry cough and some dyspnea when evaluated in office yesterday. Now reporting chills overnight and low-grade fever at home today. Wife will take patient for a PCP evaluation today but was hoping to get an interpretation of the chest CT.  She's aware I will route to provider for review & call her w findings. I did reinforce recommendation for PCP evaluation in presence of his current symptoms.

## 2016-04-03 NOTE — Telephone Encounter (Signed)
Pt was seen yesterday,wife is calling to say last night he had chills,coughing and he has a fever this morning.Please call to advise.

## 2016-04-04 NOTE — Telephone Encounter (Signed)
See my separate result note, I called the patient's wife last night (at his request since his wife is a Marine scientist). Explained that CT of chest was normal and Blood work was also normal except his sed rate and CRP which indicate inflammation were both elevated, concerning for either infection or rheumatological issues. He has been seen by his PCP and given levaquin which may improve his symptom. If his condition continue to deteriorate despite antibiotics, I have advised his wife during last visit to take him to ED, for now, she will monitor the symptom.

## 2016-04-08 NOTE — Telephone Encounter (Signed)
thx

## 2016-04-10 ENCOUNTER — Encounter: Payer: Self-pay | Admitting: Cardiovascular Disease

## 2016-04-10 ENCOUNTER — Ambulatory Visit (INDEPENDENT_AMBULATORY_CARE_PROVIDER_SITE_OTHER): Payer: PPO | Admitting: Cardiovascular Disease

## 2016-04-10 VITALS — BP 106/70 | HR 99 | Ht 69.0 in | Wt 185.0 lb

## 2016-04-10 DIAGNOSIS — R0609 Other forms of dyspnea: Secondary | ICD-10-CM | POA: Diagnosis not present

## 2016-04-10 DIAGNOSIS — R700 Elevated erythrocyte sedimentation rate: Secondary | ICD-10-CM

## 2016-04-10 DIAGNOSIS — I10 Essential (primary) hypertension: Secondary | ICD-10-CM | POA: Diagnosis not present

## 2016-04-10 DIAGNOSIS — R0600 Dyspnea, unspecified: Secondary | ICD-10-CM

## 2016-04-10 DIAGNOSIS — M1990 Unspecified osteoarthritis, unspecified site: Secondary | ICD-10-CM

## 2016-04-10 DIAGNOSIS — R718 Other abnormality of red blood cells: Secondary | ICD-10-CM

## 2016-04-10 DIAGNOSIS — R0602 Shortness of breath: Secondary | ICD-10-CM

## 2016-04-10 DIAGNOSIS — Z8679 Personal history of other diseases of the circulatory system: Secondary | ICD-10-CM

## 2016-04-10 MED ORDER — NEBIVOLOL HCL 5 MG PO TABS: 7.5000 mg | ORAL_TABLET | Freq: Every day | ORAL | 11 refills | Status: DC

## 2016-04-10 NOTE — Patient Instructions (Signed)
You have been referred to Dr's [name] and McQuaid. Someone will call you with trhese appointments.  Your physician recommends that you schedule a follow-up appointment in: 3 months with Dr Claiborne Billings.  Your physician has recommended you make the following change in your medication:   1.) the Bystolic has been increased to 7.5 mg daily. ( 1.5 tablets daily)

## 2016-04-11 ENCOUNTER — Telehealth: Payer: Self-pay | Admitting: Cardiovascular Disease

## 2016-04-11 MED ORDER — NEBIVOLOL HCL 5 MG PO TABS: 5.0000 mg | ORAL_TABLET | Freq: Every day | ORAL | 11 refills | Status: DC

## 2016-04-11 MED ORDER — NEBIVOLOL HCL 2.5 MG PO TABS: 2.5000 mg | ORAL_TABLET | Freq: Every day | ORAL | 11 refills | Status: DC

## 2016-04-11 NOTE — Telephone Encounter (Signed)
Tammy-pharmacist at [facility] she called back and states that she spoke with pt and he is willing to pay 2 co-pays for 5mg  and 2.5mg . Will send new rx.

## 2016-04-11 NOTE — Telephone Encounter (Signed)
Spoke with Tammy-pharmacist at [facility] she states that Bystolic is irregular triangle shape and is not scored and doesn't know how pt will split in half. Please advise how to change this Sig/dose.

## 2016-04-11 NOTE — Telephone Encounter (Signed)
Please call,question about the directions for his Bystolic.

## 2016-04-11 NOTE — Progress Notes (Signed)
[name], male   DOB: [birth year], 70 y.o.   MRN: [id number]    Primary M.D.: Dr. Wende Neighbors  HPI: [name] is a 70 y.o. male who presents for cardiology follow-up evaluation.  I last saw him in January 2017, but over the past month he has seen [name], [facility] on 3 occasions and presents for follow-up evaluation  [name] has remote history of viral myocarditis in 1989 at which time his ejection fraction was 25%. He subsequently normalized LV function. He has a history of mild mitral valve prolapse. Cardiac catheterization in 2001 showed mild mid systolic LAD bridging. He has been noted to have mild T-wave changes on his ECG. Additional problems include hypertension as well as hyperlipidemia. He was unable to tolerate Lipitor. He initially tolerated Crestor but then developed significant myalgias.  He has been able to tolerate Zetia 10 mg daily.   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  Apparently, this has stabilized and is tired.  Ears have almost normalized.  He has been starting to resume very small amounts of red meat.    Past year, he has remained active.  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  He denies any episodes of chest pain, or change in exercise tolerance.  On 02/01/2015.  He underwent lumbar surgery by [name] involving L2-L3, L3-L4, and L4-L5.  He tolerated surgery without cardiovascular compromise.  When I last saw him in January 2017 he was on amlodipine 2.5 mg and atenolol 25 g twice a day, which has been helpful for his blood pressure, palpitations, as well as documented systolic LAD muscle bridging.    Over the past month, he has a definite change him to hematology and has developed progressive shortness of breath with activity.  He denies any significant chest pain.  He has seen [name], [facility] and when he was last seen by him one week ago I  went over his symptom complex and  recommended a workup strategy.  He underwent an echo Doppler study which was done on 03/22/2016 and this showed normal systolic function with an EF of 60-65% with grade 1 diastolic dysfunction.  His aorta was upper normal to mildly dilated at 38 mm.  There was no significant valvular pathology.  There was no TR Doppler jets an estimated PA systolic pressure was not able to be obtained.  Due to his shortness of breath.  He also underwent a CT of his chest.  He had dependent linear and platelike atelectasis in lower lungs bilaterally.  The patient has a remote exposure to asbestosis in his 53s, but his CT scan did not reveal any evidence for pleural based calcification.  There were diffuse atelectatic changes in the lungs bilaterally.  He also underwent a nuclear perfusion study which showed normal perfusion and function without scar or ischemia.  I had sent off laboratory was notable for a normal BMP 53.  Troponins were negative.  He had been prescribed levaquin by his primary physician as empiric therapy for a mild fever.  His fever resolved but he continues to experience shortness of breath.  His white blood count 13 days ago was 11.2.  He was macrocytic with an MCV of 102 with a normal hemoglobin at 16.2, hematocrit of 46.0.  B12 and folate levels were normal.  An erythrocyte sedimentation rate was increased at 61 and a high-sensitivity C-reactive protein  was significantly increased at 139.  He presents for evaluation.  Past Medical History:  Diagnosis Date  . Arthritis   . Digestive problems    on Prednisone prn for this issue- diarrhea  . Dysrhythmia    irregular due to Myocarditis- takes Atenolol, Norvasc  . GERD (gastroesophageal reflux disease)   . H/O viral myocarditis    25 years ago  . Head injury, closed, with concussion    [facility] LOC  . Hyperlipidemia   . Mild mitral valve prolapse    per Dr Evette Georges notes  . Polymyalgia rheumatica (HCC)     Past Surgical History:  Procedure Laterality  Date  . apendectomy  1965  . APPENDECTOMY    . bone spur Bilateral 1999   feet  . CARDIAC CATHETERIZATION  2001  . CHOLECYSTECTOMY  2004  . COLONOSCOPY    . LUMBAR LAMINECTOMY/ DECOMPRESSION WITH MET-RX Right 05/05/2013   Procedure: Right Lumbar three-four Extraforaminal Microdiskectomy with Metrex;  Surgeon: [name], MD;  Location: [facility];  Service: Neurosurgery;  Laterality: Right;  Right Lumbar three-four Extraforaminal Microdiskectomy with Metrex  . SHOULDER OPEN ROTATOR CUFF REPAIR Bilateral 2001  . TONSILLECTOMY      Allergies  Allergen Reactions  . Diltiazem Hcl Er Coated Beads Shortness Of Breath  . Lidocaine Hcl Anaphylaxis    Xylocaine  . Codeine Nausea And Vomiting  . Crestor [Rosuvastatin Calcium]     Body Aches  . Percocet [Oxycodone-Acetaminophen] Itching    Current Outpatient Prescriptions  Medication Sig Dispense Refill  . acetaminophen (TYLENOL) 500 MG tablet Take 500 mg by mouth every 6 (six) hours as needed.    Marland Kitchen amLODipine (NORVASC) 2.5 MG tablet TAKE ONE (1) TABLET BY MOUTH EVERY DAY 30 tablet 10  . apixaban (ELIQUIS) 5 MG TABS tablet Take 1 tablet (5 mg total) by mouth 2 (two) times daily. 60 tablet 11  . Cholecalciferol (VITAMIN D3) 2000 units TABS Take 1 capsule by mouth daily.    Marland Kitchen dextromethorphan-guaiFENesin (MUCINEX DM) 30-600 MG 12hr tablet Take 1 tablet by mouth 2 (two) times daily.    Marland Kitchen esomeprazole (NEXIUM) 20 MG packet Take 20 mg by mouth as directed.    . ezetimibe (ZETIA) 10 MG tablet TAKE ONE (1) TABLET BY MOUTH EVERY DAY 30 tablet 10  . ondansetron (ZOFRAN) 4 MG tablet Take 4 mg by mouth every 8 (eight) hours as needed for nausea or vomiting.    . tamsulosin (FLOMAX) 0.4 MG CAPS capsule Take 0.4 mg by mouth as directed.    . nebivolol (BYSTOLIC) 2.5 MG tablet Take 1 tablet (2.5 mg total) by mouth daily. Take along with 81m (7.512mtotal) daily 30 tablet 11  . nebivolol (BYSTOLIC) 5 MG tablet Take 1 tablet (5 mg total) by mouth daily.  Take along with 2.40m67m7.40mg26mtal) daily 30 tablet 11   No current facility-administered medications for this visit.     Social History   Social History  . Marital status: Married    Spouse name: N/A  . Number of children: N/A  . Years of education: N/A   Occupational History  . Not on file.   Social History Main Topics  . Smoking status: Former Smoker    Years: 1.00    Types: Cigarettes    Quit date: 07/10/1971  . Smokeless tobacco: Never Used     Comment: Patient smoked 1/2 pack per week  . Alcohol use 8.4 oz/week    14 Glasses of wine per week  Comment: 1-2 glasses of wine with evening meal  . Drug use: No  . Sexual activity: Yes    Birth control/ protection: Other-see comments     Comment: old age   Other Topics Concern  . Not on file   Social History Narrative  . 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 He is married has 2 children. There is no tobacco use. He stays active. There is no alcohol use.  He is still working but had a significantly reduced pace than he had previously.  Family history is notable that both parents are deceased.  Mother died of old age.  Father died but had a history of mitral valve prolapse.  He has 2 brothers and one sister who are  alive and well.   ROS General: Negative; No fevers, chills, or night sweats;  HEENT: Negative; No changes in vision or hearing, sinus congestion, difficulty swallowing Pulmonary:  shortness of breath with activity Cardiovascular: See history of present illness GI: No recent diarrhea. GU: Negative; No dysuria, hematuria, or difficulty voiding Musculoskeletal: Negative; no myalgias, joint pain, or weakness Hematologic/Oncology: Negative; no easy bruising, bleeding Endocrine: Negative; no heat/cold intolerance; no diabetes Neuro: Negative; no changes in balance, headaches Skin: Negative; No rashes or skin lesions Psychiatric: Negative; No behavioral problems, depression Sleep: Negative; No  snoring, daytime sleepiness, hypersomnolence, bruxism, restless legs, hypnogognic hallucinations, no cataplexy Other comprehensive 14 point system review is negative.  PE BP 106/70   Pulse 99   Ht _0  (1.753 m)   Wt 185 lb (83.9 kg)   BMI 27.32 kg/m    Repeat blood pressure by me 120/64.  Wt Readings from Last 3 Encounters:  04/10/16 185 lb (83.9 kg)  04/02/16 186 lb (84.4 kg)  03/28/16 187 lb (84.8 kg)   General: Alert, oriented, no distress.  Skin: normal turgor, no rashes HEENT: Normocephalic, atraumatic. Pupils round and reactive; sclera anicteric;no lid lag.  Nose without nasal septal hypertrophy Mouth/Parynx benign; Mallinpatti scale 2 Neck: No JVD, no carotid bruits with normal carotid upstroke Lungs: Bibasilar possible fibrotic changes without wet sounding rales Chest wall: Nontender to palpation Heart: RRR, s1 s2 normal 1/6 systolic murmur, no S3 gallop.  No diastolic murmur.  No rubs thrills or heaves. Abdomen: soft, nontender; no hepatosplenomehaly, BS+; abdominal aorta nontender and not dilated by palpation. Back: No CVA tenderness Pulses 2+ Extremities: no clubbing cyanosis or edema, Homan's sign negative  Neurologic: grossly nonfocal; cranial nerves intact Psychologic: normal affect and mood.  ECG (independently read by me): Normal sinus rhythm at 99 bpm, incomplete right bundle branch block.  Nondiagnostic ST-T changes.  January 2017 ECG (independently read by me): Normal sinus rhythm at 82 bpm.  Mild RV conduction delay.  Normal intervals.  No significant ST-T changes.  December 2015 ECG (independently read by me): Normal sinus rhythm at 74 bpm.  Mild RV conduction delay.  QTc interval 461 ms.  December 2014 ECG: Sinus rhythm at 57 beats per minute. No ectopy. Normal intervals.  LABS:  BMP Latest Ref Rng & Units 04/02/2016 01/24/2015 04/30/2013  Glucose 65 - 99 mg/dL 132(H) 110(H) 100(H)  BUN 7 - 25 mg/dL _1 Creatinine 0.70 - 1.25 mg/dL 0.91 0.87  0.97  Sodium 135 - 146 mmol/L 137 140 136  Potassium 3.5 - 5.3 mmol/L 4.7 4.2 3.9  Chloride 98 - 110 mmol/L 101 106 100  CO2 20 - 31 mmol/L _2 Calcium 8.6 - 10.3 mg/dL  9.1 9.4 8.6   Hepatic Function Latest Ref Rng & Units 09/16/2012 01/22/2007  Total Protein 6.0 - 8.3 g/dL 5.9(L) 6.5  Albumin 3.5 - 5.2 g/dL 3.9 3.8  AST 0 - 37 U/L 30 22  ALT 0 - 53 U/L 35 21  Alk Phosphatase 39 - 117 U/L 48 88  Total Bilirubin 0.3 - 1.2 mg/dL 1.0 1.4(H)  Bilirubin, Direct 0.0 - 0.3 mg/dL 0.1 -   CBC Latest Ref Rng & Units 04/02/2016 03/28/2016 03/14/2016  WBC 3.8 - 10.8 K/uL 9.7 11.2(H) 11.9(H)  Hemoglobin 13.2 - 17.1 g/dL 16.2 15.5 16.8  Hematocrit 38.5 - 50.0 % 46.0 44.8 47.9  Platelets 140 - 400 K/uL 304 212 188   Lab Results  Component Value Date   MCV 102.7 (H) 04/02/2016   MCV 102.1 (H) 03/28/2016   MCV 103.0 (H) 03/14/2016   Erythrocyte Sedimentation Rate     Component Value Date/Time   ESRSEDRATE 61 (H) 04/02/2016 1435   CRP 139  No results found for: TSH   No results found for: HGBA1C   Lipid Panel  No results found for: CHOL, TRIG, HDL, CHOLHDL, VLDL, LDLCALC, LDLDIRECT ------------------------------------------------------------------------------------------------------------- Oxygen Saturation: Rest: 92% with pulse of 93  Following walking in the office: 93% with a pulse of 106  03/21/2016 Nuclear Study Highlights    The left ventricular ejection fraction is normal (55-65%).  Nuclear stress EF: 61%.  The study is normal.  This is a low risk study.  There was no ST segment deviation noted during stress.     ------------------------------------------------------------------- 03/22/2016 ECHO Study Conclusions  - Left ventricle: The cavity size was normal. Wall thickness was   normal. Systolic function was normal. The estimated ejection   fraction was in the range of 60% to 65%. Wall motion was normal;   there were no regional wall motion abnormalities.  Doppler   parameters are consistent with abnormal left ventricular   relaxation (grade 1 diastolic dysfunction). - Aortic valve: There was no stenosis. - Aorta: Mildly dilated aortic root. Aortic root dimension: 38 mm   (ED). - Mitral valve: There was no significant regurgitation. - Right ventricle: The cavity size was normal. Systolic function   was normal. - Pulmonary arteries: No complete TR doppler jet so unable to   estimate PA systolic pressure. - Inferior vena cava: The vessel was normal in size. The   respirophasic diameter changes were in the normal range (>= 50%),   consistent with normal central venous pressure.  Impressions:  - Normal LV size with EF 60-65%. Normal RV size and systolic   function. No significant valvular abnormalities.  --------------------------------------------------------------------------------------------  04/02/2016 EXAM: CT CHEST WITHOUT CONTRAST  TECHNIQUE: Multidetector CT imaging of the chest was performed following the standard protocol without IV contrast.  COMPARISON:  None.  FINDINGS: Cardiovascular: The heart size is normal. No pericardial effusion. Atherosclerotic calcification is noted in the wall of the thoracic aorta.  Mediastinum/Nodes: No mediastinal lymphadenopathy. No evidence for gross hilar lymphadenopathy although assessment is limited by the lack of intravenous contrast on today's study. There is no axillary lymphadenopathy.  Lungs/Pleura: Fine detail obscured by breathing motion. Dependent linear and platelike atelectasis noted in the lower lungs bilaterally. Components of associated parenchymal scarring not excluded. No evidence for pulmonary edema. No dense focal airspace consolidation. No evidence for pleural effusion.  Upper Abdomen: The liver shows diffusely decreased attenuation suggesting steatosis. Gallbladder surgically absent.  Musculoskeletal: Degenerative changes noted in the  shoulders bilaterally.  IMPRESSION: Relatively diffuse atelectatic  changes in the lungs bilaterally. Study degraded by patient breathing motion.  No specific findings to explain the patient's history of shortness of breath.  ASSESSMENT AND PLAN: [name] is a 70 year old Caucasian male who has a remote history of viral myocarditis associated with an ejection fraction of 25% in 1989.  His echo Doppler study in October 2011 showed an ejection fraction greater than 55% with mild TR.  He is not having any palpitations on his current dose of atenolol 25 mg twice a day.  He has a history of mitral valve prolapse involving the anterior mitral valve leaflet which was seen on prior echo Doppler studies.  His father also had mitral valve prolapse and is deceased.  He previously had undergone cardiac catheterization by me in 2001, which showed mild mid systolic bridging.  He has had chronic T-wave abnormalities on his ECG.  Over the past month.  There is been a definite change in his symptom complex.  He has developed progressive increasing shortness of breath with activity.  He also had a low-grade fever and was empirically treated with Levaquin by his primary physician with resolution of the fever, but with continued dyspnea.  He admits to exposure to as best stenosis when he was in his 17s.  A recent CT of his chest showed basilar diffuse atelectasis.  There is no evidence for pleural based calcifications.  I reviewed with him in detail.  His most recent echo Doppler study, which again shows normal systolic function and only grade 1 diastolic dysfunction, which would not explain his significant dyspnea.  In addition, his nuclear perfusion study continues to show normal perfusion without scar or ischemia.  His BMP is normal and are use against a cardiac etiology to his dyspnea.  He has been followed in the past by Dr. Charlestine Night and has a presumptive diagnosis of possible polymyalgia rheumatica.  I had  ordered an erythrocyte sedimentation rate which was significantly increased at 61 and his high-sensitivity C-reactive protein was markedly elevated at 139.  Long time with him today in the office.  Oxygen saturation at rest was 92% with a heart rate of 93% and after walking.  His O2 sat duration was 93% and pulse rate had increased to 106.  I am recommending slight titration of Bystolic to 7.5 mg from his present dose of 5 mg.  I have recommended he see Dr. Charlestine Night back for rheumatologic evaluation since I suspect there may be a significant inflammatory component contributimg to his dyspnea.  There is no definitive history of rheumatoid arthritis with rheumatoid lung disease.  I will also refer him to [name] to evaluate for possible interstitial fibrosis/ pneumonitis or other pulmonary etiologies for his new onset  dyspnea.  I will see him back in 2-3 months for follow-up evaluation.    Time spent: 40 minutes  Troy Sine, MD, [facility]  04/11/2016 9:01 PM

## 2016-04-12 ENCOUNTER — Telehealth: Payer: Self-pay | Admitting: Pulmonary Disease

## 2016-04-12 NOTE — Telephone Encounter (Signed)
Spoke with pt, he is requesting that BQ review his ct chest and his labs ordered by Dr. Claiborne Billings- states that Dr. Claiborne Billings had seemed concerned about these results and was insistent that he follow up with [place] in the near future.   Pt is scheduled for a consult with BQ on 12/18.  BQ please advise.  Thanks.

## 2016-04-16 ENCOUNTER — Telehealth: Payer: Self-pay | Admitting: Cardiovascular Disease

## 2016-04-16 NOTE — Telephone Encounter (Signed)
Called spoke with patient Advised BQ recommended he keep his 12.19.17 appt  Pt okay with this and voiced his understanding Nothing further needed; will sign off

## 2016-04-16 NOTE — Telephone Encounter (Signed)
Keep appointment for consult

## 2016-04-16 NOTE — Telephone Encounter (Signed)
Patient returning call.

## 2016-04-16 NOTE — Telephone Encounter (Signed)
New message      Calling to let Dr Claiborne Billings know an appt has been made to see Dr Charlestine Night for Wednesday, dec 6th at 3:30.

## 2016-04-16 NOTE — Telephone Encounter (Signed)
lmtcb x1 for pt. 

## 2016-04-18 ENCOUNTER — Ambulatory Visit
Admission: RE | Admit: 2016-04-18 | Discharge: 2016-04-18 | Disposition: A | Discharge status: Home / Self Care | Payer: PPO | Source: Ambulatory Visit | Attending: Rheumatology | Admitting: Rheumatology

## 2016-04-18 ENCOUNTER — Other Ambulatory Visit: Payer: Self-pay | Admitting: Rheumatology

## 2016-04-18 DIAGNOSIS — R509 Fever, unspecified: Secondary | ICD-10-CM | POA: Diagnosis not present

## 2016-04-18 DIAGNOSIS — J189 Pneumonia, unspecified organism: Secondary | ICD-10-CM | POA: Diagnosis not present

## 2016-04-18 DIAGNOSIS — R0602 Shortness of breath: Secondary | ICD-10-CM | POA: Diagnosis not present

## 2016-04-18 DIAGNOSIS — R7982 Elevated C-reactive protein (CRP): Secondary | ICD-10-CM | POA: Diagnosis not present

## 2016-04-18 DIAGNOSIS — R700 Elevated erythrocyte sedimentation rate: Secondary | ICD-10-CM | POA: Diagnosis not present

## 2016-04-19 DIAGNOSIS — R700 Elevated erythrocyte sedimentation rate: Secondary | ICD-10-CM | POA: Diagnosis not present

## 2016-04-19 DIAGNOSIS — R7982 Elevated C-reactive protein (CRP): Secondary | ICD-10-CM | POA: Diagnosis not present

## 2016-04-19 DIAGNOSIS — R0602 Shortness of breath: Secondary | ICD-10-CM | POA: Diagnosis not present

## 2016-04-19 DIAGNOSIS — J189 Pneumonia, unspecified organism: Secondary | ICD-10-CM | POA: Diagnosis not present

## 2016-04-19 DIAGNOSIS — R509 Fever, unspecified: Secondary | ICD-10-CM | POA: Diagnosis not present

## 2016-04-25 ENCOUNTER — Telehealth: Payer: Self-pay | Admitting: Cardiovascular Disease

## 2016-04-25 ENCOUNTER — Ambulatory Visit: Payer: PPO | Admitting: Cardiovascular Disease

## 2016-04-25 NOTE — Telephone Encounter (Signed)
Acknowledged. Filling pharmacy aware to call back if further guidance needed.

## 2016-04-25 NOTE — Telephone Encounter (Signed)
Spoke to [employer]. She notes pt is prescribed 7.5mg  daily for the bystolic - in form of 2 Rx(s) - 2.5mg  and 5mg . Because of this, he has 2 $40 copays for the med.   Asking if OK to run this as 3x 2.5mg  tablets daily to see if insurance will approve, so that patient only has 1 $40 copay instead of two. I advised OK to run that way - if insurance approves and this works out to be cheaper for patient, no problem. Also, to consider possibility of patient using pill cutter and getting the 5mg  tablets. She will call back if insurance will not cover so that we can discuss dose change or med change.  Will route to pharmD for any considerations.

## 2016-04-25 NOTE — Telephone Encounter (Signed)
nebivolol (BYSTOLIC)   Has question about getting just one script for medication. Patient is having to pay two co-pays now medication

## 2016-04-25 NOTE — Telephone Encounter (Signed)
I think either option would be appropriate. It would likely be easier for patient to get 3-2.5mg  tablets to save him from cutting them.

## 2016-04-26 DIAGNOSIS — R7982 Elevated C-reactive protein (CRP): Secondary | ICD-10-CM | POA: Diagnosis not present

## 2016-04-26 DIAGNOSIS — Z79899 Other long term (current) drug therapy: Secondary | ICD-10-CM | POA: Diagnosis not present

## 2016-04-26 DIAGNOSIS — J189 Pneumonia, unspecified organism: Secondary | ICD-10-CM | POA: Diagnosis not present

## 2016-04-26 DIAGNOSIS — R0602 Shortness of breath: Secondary | ICD-10-CM | POA: Diagnosis not present

## 2016-04-26 DIAGNOSIS — R05 Cough: Secondary | ICD-10-CM | POA: Diagnosis not present

## 2016-05-01 ENCOUNTER — Ambulatory Visit (INDEPENDENT_AMBULATORY_CARE_PROVIDER_SITE_OTHER): Payer: PPO | Admitting: Pulmonary Disease

## 2016-05-01 ENCOUNTER — Encounter: Payer: Self-pay | Admitting: Pulmonary Disease

## 2016-05-01 ENCOUNTER — Other Ambulatory Visit: Payer: PPO

## 2016-05-01 VITALS — BP 112/64 | HR 92 | Ht 69.0 in | Wt 187.0 lb

## 2016-05-01 DIAGNOSIS — J849 Interstitial pulmonary disease, unspecified: Secondary | ICD-10-CM

## 2016-05-01 DIAGNOSIS — R0609 Other forms of dyspnea: Secondary | ICD-10-CM

## 2016-05-01 DIAGNOSIS — R0600 Dyspnea, unspecified: Secondary | ICD-10-CM

## 2016-05-01 NOTE — Assessment & Plan Note (Signed)
[name] describes persistent dyspnea over the last 6-8 weeks since his initial presentation to the emergency room with atrial fibrillation. Objectively his lungs are essentially clear on exam with a few crackles in the bases but his CT scan of the chest in November 2017 clearly shows patchy atelectasis bilaterally and perhaps some groundglass. He also has a slightly patulous esophagus. This was all associated with nonspecific systemic inflammatory symptoms with fever, body aches, and some sinus congestion. Those symptoms improved with prednisone but his dyspnea remains.  Given his chest CT abnormalities differential diagnosis includes an atypical infection with something like a virus or an atypical bacterial process versus an underlying systemic condition such as a vasculitis or interstitial lung disease. Compared to the general population he is at increased risk for these conditions given his known underlying polymyalgia rheumatica. Silent aspiration is also in the differential considering his slightly patulous esophagus. The differential diagnosis would also include some form of diaphragmatic weakness given the subsegmental atelectasis, but I'm encouraged by the fact that he has no other sign of systemic weakness on exam. My hope is that this was just a viral process which will pass.  Plan: Check pulmonary function testing now Check aldolase, CK, and antineutrophil cytoplasmic antibodies today Check barium swallow for evaluation of aspiration or esophageal disorder Taper off of prednisone now Return to clinic about 2 weeks after tapering off prednisone and then check more extended interstitial lung disease serology panel: Anti SCL-70, SSA/SSB, ANA, RF, CCP, Hypersensitivity pneumonitis panel, Anti-centromere.   Order HRCT when he returns to clinic If worsening symptoms or radiographic changes he will need an open lung biopsy  Explained to the patient and his wife in detail today. Greater than 50%  of this 55 minute visit was spent face-to-face

## 2016-05-01 NOTE — Telephone Encounter (Signed)
I had reviewed with patient that office visit

## 2016-05-01 NOTE — Progress Notes (Signed)
Subjective:    [name], male    DOB: 06-19-1945, 70 y.o.   MRN: [id number]  HPI Chief Complaint  Patient presents with  . Advice Only    Referred by Dr. Claiborne Billings for worsening DOE X6 weeks.     Davio was referred by Dr. Claiborne Billings for evaluation of shortness of breath which developed in the last 6-8 weeks.  This developed abruptly in October while at the beach.  He woke up at 2 AM with chest pain and dyspnea and went to the ER and was treated for atrial fibrilliation with cardizem.  He followed with Dr. Claiborne Billings and is now back in sinus rhythm but remains dyspnic.  He had cardizem changed to atenolol.  Amiodarone was not used.  He was eventually changed form atenolol to Bystolic.  Since this episode he had a low grade fever and he was treated with levaquin.  He had body aches, sinus congestion, muscle aches at that time.  He went to cardiology who ordered a CT scan that showed scarring in his lungs.  An echo was normal at that time.  He had a blood test that showed a high CRP.   He doesn't note choking on food or food getting stuck when he eats.  No recent vomiting.  He had a lumbar injection by Dr. Ellene Route in 02/2016.   He was referred to Dr. Charlestine Night with whom he has followed for years for polymyalgia rheumatica. He diagnosed him with adrenal insufficiency (per patient) and was started on prednisone again. He has been on prednisone 10mg  daily.  His achiness and malaise improved but he still has dyspnea.   Dyspnea: > occurs with any activity: specifically he notes it when climbing stairs or up a hill.  Walking up a hill will make it worse.   > associated with a dry cough > walking will make him dyspnea and sometimes even nauseated when he "overdoes it" > when dyspneic, he feels like he can't get enough oxygen in > panting  > no tightness or wheezing > it has remained constant, not improving > he recalls that he was lethargic for a few days after this No childhood lung  disease.  No associated muscle weakness.  No smoking since his teenage years.  He is a Clinical biochemist.  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 This would create a lot of dust.  He used to work in [employer]. 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  He is exposed to a lot of saw dust at work, rarely red cedar.  No recent mold or mildew in the house.    Past Medical History:  Diagnosis Date  . Arthritis   . Digestive problems    on Prednisone prn for this issue- diarrhea  . Dysrhythmia    irregular due to Myocarditis- takes Atenolol, Norvasc  . GERD (gastroesophageal reflux disease)   . H/O viral myocarditis    25 years ago  . Head injury, closed, with concussion    Breif LOC  . Hyperlipidemia   . Mild mitral valve prolapse    per Dr Evette Georges notes  . Polymyalgia rheumatica (HCC)      Family History  Problem Relation Age of Onset  . Rheum arthritis Mother   . Thyroid cancer Mother   . Heart disease Father   . Asthma Father   . Thyroid cancer Sister   .  Melanoma Brother      Social History   Social History  . Marital status: Married    Spouse name: N/A  . Number of children: N/A  . Years of education: N/A   Occupational History  . Not on file.   Social History Main Topics  . Smoking status: Former Smoker    Packs/day: 0.02    Years: 1.00    Types: Cigarettes    Quit date: 07/10/1971  . Smokeless tobacco: Never Used     Comment: Patient smoked 1/2 pack per week  . Alcohol use 8.4 oz/week    14 Glasses of wine per week     Comment: 1-2 glasses of wine with evening meal  . Drug use: No  . Sexual activity: Yes    Birth control/ protection: Other-see comments     Comment: old age   Other Topics Concern  . Not on file   Social History Narrative  . No narrative on file     Allergies  Allergen Reactions  . Diltiazem Hcl Er Coated Beads Shortness Of Breath  .  Lidocaine Hcl Anaphylaxis    Xylocaine  . Codeine Nausea And Vomiting  . Crestor [Rosuvastatin Calcium]     Body Aches  . Percocet [Oxycodone-Acetaminophen] Itching     Outpatient Medications Prior to Visit  Medication Sig Dispense Refill  . acetaminophen (TYLENOL) 500 MG tablet Take 500 mg by mouth every 6 (six) hours as needed.    Marland Kitchen amLODipine (NORVASC) 2.5 MG tablet TAKE ONE (1) TABLET BY MOUTH EVERY DAY 30 tablet 10  . apixaban (ELIQUIS) 5 MG TABS tablet Take 1 tablet (5 mg total) by mouth 2 (two) times daily. 60 tablet 11  . dextromethorphan-guaiFENesin (MUCINEX DM) 30-600 MG 12hr tablet Take 1 tablet by mouth 2 (two) times daily.    Marland Kitchen esomeprazole (NEXIUM) 20 MG packet Take 20 mg by mouth as needed.     . ezetimibe (ZETIA) 10 MG tablet TAKE ONE (1) TABLET BY MOUTH EVERY DAY 30 tablet 10  . nebivolol (BYSTOLIC) 2.5 MG tablet Take 1 tablet (2.5 mg total) by mouth daily. Take along with 5mg  (7.5mg  total) daily 30 tablet 11  . nebivolol (BYSTOLIC) 5 MG tablet Take 1 tablet (5 mg total) by mouth daily. Take along with 2.5mg  (7.5mg  total) daily 30 tablet 11  . ondansetron (ZOFRAN) 4 MG tablet Take 4 mg by mouth every 8 (eight) hours as needed for nausea or vomiting.    . tamsulosin (FLOMAX) 0.4 MG CAPS capsule Take 0.4 mg by mouth as directed.    . Cholecalciferol (VITAMIN D3) 2000 units TABS Take 1 capsule by mouth daily.     No facility-administered medications prior to visit.       Review of Systems  Constitutional: Negative for fever and unexpected weight change.  HENT: Positive for postnasal drip. Negative for congestion, dental problem, ear pain, nosebleeds, rhinorrhea, sinus pressure, sneezing, sore throat and trouble swallowing.   Eyes: Negative for redness and itching.  Respiratory: Positive for cough and shortness of breath. Negative for chest tightness and wheezing.   Cardiovascular: Negative for palpitations and leg swelling.  Gastrointestinal: Negative for nausea and  vomiting.  Genitourinary: Negative for dysuria.  Musculoskeletal: Negative for joint swelling.  Skin: Negative for rash.  Neurological: Negative for headaches.  Hematological: Does not bruise/bleed easily.  Psychiatric/Behavioral: Negative for dysphoric mood. The patient is not nervous/anxious.        Objective:   Physical Exam Vitals:   05/01/16  0900  BP: 112/64  Pulse: 92  SpO2: 93%  Weight: 187 lb (84.8 kg)  Height: 5\' 9"  (1.753 m)   RA  Gen: well appearing, no acute distress HENT: NCAT, OP clear, neck supple without masses Eyes: PERRL, EOMi Lymph: no cervical lymphadenopathy PULM: few crackles bases B CV: RRR, no mgr, no JVD GI: BS+, soft, nontender, no hsm Derm: no rash or skin breakdown MSK: normal bulk and tone Neuro: A&Ox4, CN II-XII intact, strength 5/5 in all 4 extremities Psyche: normal mood and affect   Records reviewed from Dr. Shelva Majestic in the cardiology clinic. Mr. Knauf has a history of viral myocarditis and recently developed dyspnea in the setting of some tachycardia  Records from Dr. Charlestine Night reviewed where it appears that he has been cared for for either "mild rheumatoid arthritis" or polymyalgia rheumatica over the years. He was treated with prednisone after that visit.  November 2017 high-resolution CT scan of the chest images personally reviewed showing patchy groundglass bilaterally which appears predominantly to be patchy lower lobe atelectasis     Assessment & Plan:  Dyspnea Mr. Bee describes persistent dyspnea over the last 6-8 weeks since his initial presentation to the emergency room with atrial fibrillation. Objectively his lungs are essentially clear on exam with a few crackles in the bases but his CT scan of the chest in November 2017 clearly shows patchy atelectasis bilaterally and perhaps some groundglass. He also has a slightly patulous esophagus. This was all associated with nonspecific systemic inflammatory symptoms with  fever, body aches, and some sinus congestion. Those symptoms improved with prednisone but his dyspnea remains.  Given his chest CT abnormalities differential diagnosis includes an atypical infection with something like a virus or an atypical bacterial process versus an underlying systemic condition such as a vasculitis or interstitial lung disease. Compared to the general population he is at increased risk for these conditions given his known underlying polymyalgia rheumatica. Silent aspiration is also in the differential considering his slightly patulous esophagus. The differential diagnosis would also include some form of diaphragmatic weakness given the subsegmental atelectasis, but I'm encouraged by the fact that he has no other sign of systemic weakness on exam. My hope is that this was just a viral process which will pass.  Plan: Check pulmonary function testing now Check aldolase, CK, and antineutrophil cytoplasmic antibodies today Check barium swallow for evaluation of aspiration or esophageal disorder Taper off of prednisone now Return to clinic about 2 weeks after tapering off prednisone and then check more extended interstitial lung disease serology panel: Anti SCL-70, SSA/SSB, ANA, RF, CCP, Hypersensitivity pneumonitis panel, Anti-centromere.   Order HRCT when he returns to clinic If worsening symptoms or radiographic changes he will need an open lung biopsy  Explained to the patient and his wife in detail today. Greater than 50% of this 55 minute visit was spent face-to-face    Current Outpatient Prescriptions:  .  acetaminophen (TYLENOL) 500 MG tablet, Take 500 mg by mouth every 6 (six) hours as needed., Disp: , Rfl:  .  amLODipine (NORVASC) 2.5 MG tablet, TAKE ONE (1) TABLET BY MOUTH EVERY DAY, Disp: 30 tablet, Rfl: 10 .  apixaban (ELIQUIS) 5 MG TABS tablet, Take 1 tablet (5 mg total) by mouth 2 (two) times daily., Disp: 60 tablet, Rfl: 11 .  dextromethorphan-guaiFENesin (MUCINEX  DM) 30-600 MG 12hr tablet, Take 1 tablet by mouth 2 (two) times daily., Disp: , Rfl:  .  esomeprazole (NEXIUM) 20 MG packet, Take 20 mg  by mouth as needed. , Disp: , Rfl:  .  ezetimibe (ZETIA) 10 MG tablet, TAKE ONE (1) TABLET BY MOUTH EVERY DAY, Disp: 30 tablet, Rfl: 10 .  nebivolol (BYSTOLIC) 2.5 MG tablet, Take 1 tablet (2.5 mg total) by mouth daily. Take along with 5mg  (7.5mg  total) daily, Disp: 30 tablet, Rfl: 11 .  nebivolol (BYSTOLIC) 5 MG tablet, Take 1 tablet (5 mg total) by mouth daily. Take along with 2.5mg  (7.5mg  total) daily, Disp: 30 tablet, Rfl: 11 .  ondansetron (ZOFRAN) 4 MG tablet, Take 4 mg by mouth every 8 (eight) hours as needed for nausea or vomiting., Disp: , Rfl:  .  tamsulosin (FLOMAX) 0.4 MG CAPS capsule, Take 0.4 mg by mouth as directed., Disp: , Rfl:

## 2016-05-01 NOTE — Patient Instructions (Signed)
We will arrange a lung function test We will check your blood work today and call you with the results We will arrange for a barium swallow test I would like for you to taper off of prednisone over the next 10 days. I recommend that tomorrow you take 5 mg, do this for 5 days in a row. After that take 5 mg every other day for 5 days then stop. We will have you return in 3-4 weeks and draw your blood at that point for an expanded panel of blood work. We will also arrange for a high-resolution CT scan of the chest at that point.  Let us know if your symptoms worsen during this time.

## 2016-05-02 LAB — ANCA SCREEN W REFLEX TITER: ANCA Screen: NEGATIVE

## 2016-05-02 LAB — CK TOTAL AND CKMB (NOT AT ARMC)
CK, MB: 0.9 ng/mL (ref 0.0–5.0)
Relative Index: 3.6 (ref 0.0–4.0)
Total CK: 25 U/L (ref 7–232)

## 2016-05-03 ENCOUNTER — Ambulatory Visit (INDEPENDENT_AMBULATORY_CARE_PROVIDER_SITE_OTHER): Payer: PPO | Admitting: Pulmonary Disease

## 2016-05-03 DIAGNOSIS — J849 Interstitial pulmonary disease, unspecified: Secondary | ICD-10-CM

## 2016-05-03 LAB — PULMONARY FUNCTION TEST
DL/VA % pred: 91 %
DL/VA: 4.21 ml/min/mmHg/L
DLCO cor % pred: 75 %
DLCO cor: 23.99 ml/min/mmHg
DLCO unc % pred: 75 %
DLCO unc: 23.92 ml/min/mmHg
FEF 25-75 Post: 3.55 L/sec
FEF 25-75 Pre: 2.86 L/sec
FEF2575-%Change-Post: 24 %
FEF2575-%Pred-Post: 142 %
FEF2575-%Pred-Pre: 114 %
FEV1-%Change-Post: 4 %
FEV1-%Pred-Post: 93 %
FEV1-%Pred-Pre: 88 %
FEV1-Post: 3.02 L
FEV1-Pre: 2.88 L
FEV1FVC-%Change-Post: 1 %
FEV1FVC-%Pred-Pre: 109 %
FEV6-%Change-Post: 3 %
FEV6-%Pred-Post: 88 %
FEV6-%Pred-Pre: 85 %
FEV6-Post: 3.65 L
FEV6-Pre: 3.55 L
FEV6FVC-%Change-Post: 0 %
FEV6FVC-%Pred-Post: 105 %
FEV6FVC-%Pred-Pre: 105 %
FVC-%Change-Post: 2 %
FVC-%Pred-Post: 83 %
FVC-%Pred-Pre: 80 %
FVC-Post: 3.65 L
FVC-Pre: 3.55 L
Post FEV1/FVC ratio: 83 %
Post FEV6/FVC ratio: 100 %
Pre FEV1/FVC ratio: 81 %
Pre FEV6/FVC Ratio: 100 %
RV % pred: 94 %
RV: 2.26 L
TLC % pred: 84 %
TLC: 5.85 L

## 2016-05-03 LAB — ALDOLASE: Aldolase: 3.6 U/L (ref <=8.1)

## 2016-05-09 ENCOUNTER — Ambulatory Visit (HOSPITAL_COMMUNITY)
Admission: RE | Admit: 2016-05-09 | Discharge: 2016-05-09 | Disposition: A | Discharge status: Home / Self Care | Payer: PPO | Source: Ambulatory Visit | Attending: Pulmonary Disease | Admitting: Pulmonary Disease

## 2016-05-09 DIAGNOSIS — J849 Interstitial pulmonary disease, unspecified: Secondary | ICD-10-CM | POA: Insufficient documentation

## 2016-05-09 DIAGNOSIS — K449 Diaphragmatic hernia without obstruction or gangrene: Secondary | ICD-10-CM | POA: Diagnosis not present

## 2016-05-11 ENCOUNTER — Telehealth: Payer: Self-pay | Admitting: Pulmonary Disease

## 2016-05-11 NOTE — Progress Notes (Signed)
See PN dated 12/29

## 2016-05-11 NOTE — Telephone Encounter (Signed)
Spoke with pt, aware of results/recs.  Nothing further needed.  

## 2016-05-11 NOTE — Telephone Encounter (Signed)
Patient is returning phone call.  °

## 2016-05-11 NOTE — Telephone Encounter (Signed)
Juanito Doom, MD sent to Len Blalock, CMA        A,  Please let the patient know this was OK  Thanks,  B    The above are lab and dg esophagus test results  LMTCB

## 2016-05-11 NOTE — Progress Notes (Signed)
See PN 12/29

## 2016-05-23 ENCOUNTER — Telehealth: Payer: Self-pay | Admitting: Cardiovascular Disease

## 2016-05-23 NOTE — Telephone Encounter (Signed)
[name] states that he stopped taking Eliquis on Monday, because he had been nauseous and had a rash for two weeks. Since he stopped Eliquis he no longer has rash and would like to know what steps to take next, please call. Thanks.

## 2016-05-23 NOTE — Telephone Encounter (Signed)
Returned call to discuss patient's discontinuation of his Eliquis. "been on 3 or 4 things", had reactions to them.  Notes he only recently started the Eliquis on 11/20. For "two or three weeks or possibly longer", states recurrent, debilitating nausea & rash on face and chest. Since discontinuation Monday, has abated.   "basically incapacitated when he takes it"  I did speak w him regarding the importance of taking a blood thinning medication to improve his stroke risk. He voiced understanding but states he refuses to take the Eliquis & will need some other option.  He also voices concern that the bystolic is expensive, between this and Eliquis he's having a hard time affording what he needs. Would like to see if he can go back on atenolol instead of the bystolic.  I mentioned brand NOACs, and warfarin, - which he was initially dismissive of, but given options he noted may be a better solution given his concerns for medication costs.  Aware that I will route message for pharmD to review.

## 2016-05-24 NOTE — Telephone Encounter (Signed)
LMOM - asked patient to call back.  Have seen several patients have allergic reaction to Eliquis in the past.  All involved rash on the face.  Per office note Nov 20 from [facility], patient was originally on Xarelto, but switched to Eliquis.  At this point would recommend warfarin.

## 2016-05-25 NOTE — Telephone Encounter (Signed)
Spoke to patient on the phone today. He stopped eliquis 2 days ago and held amlodipine for 1 day. Now feeling better but not sure about what to do with his anticoagulation.    Recommendations discussed with patient :   1. initiate warfarin as long term anticoagulation         OR  2. re-trial with Eliquis.    Patient preference is to avoid re-trial and not sure about warfarin either.  He wants to know Dr Evette Georges opinion before making any final decision.  Currently on no anticoagulation. Intrusted to resume ASA 81mg  daily for now until further instructions from Cardiologist.

## 2016-05-29 ENCOUNTER — Other Ambulatory Visit: Payer: PPO

## 2016-05-29 ENCOUNTER — Ambulatory Visit (INDEPENDENT_AMBULATORY_CARE_PROVIDER_SITE_OTHER): Payer: PPO | Admitting: Pulmonary Disease

## 2016-05-29 ENCOUNTER — Encounter: Payer: Self-pay | Admitting: Pulmonary Disease

## 2016-05-29 VITALS — BP 118/72 | HR 96 | Ht 69.0 in | Wt 192.4 lb

## 2016-05-29 DIAGNOSIS — J849 Interstitial pulmonary disease, unspecified: Secondary | ICD-10-CM

## 2016-05-29 DIAGNOSIS — R0600 Dyspnea, unspecified: Secondary | ICD-10-CM

## 2016-05-29 DIAGNOSIS — R0609 Other forms of dyspnea: Principal | ICD-10-CM

## 2016-05-29 NOTE — Assessment & Plan Note (Signed)
I have once again gone over the images of his CT chest which shows patchy groundglass bilaterally in the lower lobes. The barium swallow images were also reviewed which showed no evidence of a significant esophageal disorder and his blood work did not show evidence of a vasculitis or a myositis. However, he had significant evidence of elevation of systemic inflammation in that his CRP was quite elevated.  Overall, I'm concerned that he has an autoimmune mediated interstitial lung disease. As he has been off prednisone for several weeks today is a good window for rest to obtain more serologies to look for evidence of a nonspecific or specific connective tissue disease. I'm going to repeat high-resolution CT scan of the chest.  After considerable discussion today I explained to him that it's very likely he will need to have an open lung biopsy to further diagnose this condition.  Plan: High-resolution CT scan of the chest Blood work today for extended serologic panel for connective tissue diseases associated with interstitial lung disease Depending on the results of these 2 tests we will determine whether or not he needs to have an open lung biopsy

## 2016-05-29 NOTE — Patient Instructions (Signed)
We will call you with the results of the bloodwork and the CT scan and then contact you in regards to whether or not you need to have an open lung biopsy.  We will plan on a follow-up with [place] in 6 weeks.

## 2016-05-29 NOTE — Progress Notes (Signed)
Subjective:    [name], male    DOB: 1945-10-21, 71 y.o.   MRN: [id number]  Synopsis: Referred in November 2017 for evaluation of shortness of breath.   HPI Chief Complaint  Patient presents with  . Follow-up    Pt stated his SOB is about the same with activity. Pt will also like PFT results.    No improvement in symptoms.  No cough bringing up mucus.  No fevers or chills now.  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 He has been having increasing muscle aches and stopping the prednisone which she attributes to his polymyalgia rheumatica. He is anxious to start back on prednisone.   Past Medical History:  Diagnosis Date  . Arthritis   . Digestive problems    on Prednisone prn for this issue- diarrhea  . Dysrhythmia    irregular due to Myocarditis- takes Atenolol, Norvasc  . GERD (gastroesophageal reflux disease)   . H/O viral myocarditis    25 years ago  . Head injury, closed, with concussion    Breif LOC  . Hyperlipidemia   . Mild mitral valve prolapse    per Dr Evette Georges notes  . Polymyalgia rheumatica (HCC)      Review of Systems  Constitutional: Positive for fatigue. Negative for chills, diaphoresis and fever.  HENT: Negative for rhinorrhea, sinus pain and sinus pressure.   Respiratory: Positive for shortness of breath. Negative for cough and wheezing.   Cardiovascular: Negative for chest pain and leg swelling.       Objective:   Physical Exam  Vitals:   05/29/16 0911  BP: 118/72  Pulse: 96  SpO2: 94%  Weight: 192 lb 6.4 oz (87.3 kg)  Height: 5\' 9"  (1.753 m)  RA  Gen: well appearing HENT: OP clear, , neck supple PULM: CTA B, normal percussion CV: RRR, no mgr, trace edema GI: BS+, soft, nontender Derm: no cyanosis or rash Psyche: normal mood and  affect   Imaging: November 2017 high-resolution CT scan of the chest images personally reviewed showing patchy groundglass bilaterally which appears predominantly to be patchy lower lobe atelectasis  Barium swallow from December 2017 no evidence of esophageal disorder.  blood work  November 2017 reviewed: CRP 139, total CK normal, antineutrophil cytoplasmic antibody testing negative   Pulmonary function testing Ratio 83%, FEV1 3.02 L 93%, FVC 3.06 L , total lung capacity 5. 12/17/1982 percent protected, DLCO 23.9 75% predicted     Assessment & Plan:  ILD (interstitial lung disease) ([place]) I have once again gone over the images of his CT chest which shows patchy groundglass bilaterally in the lower lobes. The barium swallow images were also reviewed which showed no evidence of a significant esophageal disorder and his blood work did not show evidence of a vasculitis or a myositis. However, he had significant evidence of elevation of systemic inflammation in that his CRP was quite elevated.  Overall, I'm concerned that he has an autoimmune mediated interstitial lung disease. As he has been off prednisone for several weeks today is a good window for rest to obtain more serologies to look for evidence of a nonspecific or specific connective tissue disease. I'm going to repeat high-resolution CT scan of the chest.  After considerable discussion today I explained to him  that it's very likely he will need to have an open lung biopsy to further diagnose this condition.  Plan: High-resolution CT scan of the chest Blood work today for extended serologic panel for connective tissue diseases associated with interstitial lung disease Depending on the results of these 2 tests we will determine whether or not he needs to have an open lung biopsy    Current Outpatient Prescriptions:  .  acetaminophen (TYLENOL) 500 MG tablet, Take 500 mg by mouth every 6 (six) hours as needed., Disp: , Rfl:  .   esomeprazole (NEXIUM) 20 MG packet, Take 20 mg by mouth as needed. , Disp: , Rfl:  .  ezetimibe (ZETIA) 10 MG tablet, TAKE ONE (1) TABLET BY MOUTH EVERY DAY, Disp: 30 tablet, Rfl: 10 .  nebivolol (BYSTOLIC) 2.5 MG tablet, Take 1 tablet (2.5 mg total) by mouth daily. Take along with 5mg  (7.5mg  total) daily, Disp: 30 tablet, Rfl: 11 .  nebivolol (BYSTOLIC) 5 MG tablet, Take 1 tablet (5 mg total) by mouth daily. Take along with 2.5mg  (7.5mg  total) daily, Disp: 30 tablet, Rfl: 11 .  ondansetron (ZOFRAN) 4 MG tablet, Take 4 mg by mouth every 8 (eight) hours as needed for nausea or vomiting., Disp: , Rfl:  .  predniSONE (DELTASONE) 10 MG tablet, Take 10 mg by mouth daily. 20mg  tapered, Disp: , Rfl:  .  tamsulosin (FLOMAX) 0.4 MG CAPS capsule, Take 0.4 mg by mouth as directed., Disp: , Rfl:  .  amLODipine (NORVASC) 2.5 MG tablet, TAKE ONE (1) TABLET BY MOUTH EVERY DAY (Patient not taking: Reported on 05/29/2016), Disp: 30 tablet, Rfl: 10 .  apixaban (ELIQUIS) 5 MG TABS tablet, Take 1 tablet (5 mg total) by mouth 2 (two) times daily. (Patient not taking: Reported on 05/29/2016), Disp: 60 tablet, Rfl: 11 .  dextromethorphan-guaiFENesin (MUCINEX DM) 30-600 MG 12hr tablet, Take 1 tablet by mouth 2 (two) times daily., Disp: , Rfl:

## 2016-05-30 LAB — RHEUMATOID FACTOR: Rhuematoid fact SerPl-aCnc: 24 IU/mL — ABNORMAL HIGH (ref <14)

## 2016-05-30 LAB — SJOGRENS SYNDROME-B EXTRACTABLE NUCLEAR ANTIBODY: SSB (La) (ENA) Antibody, IgG: <1

## 2016-05-30 LAB — CENTROMERE ANTIBODIES: Centromere Ab Screen: <1

## 2016-05-30 LAB — CYCLIC CITRUL PEPTIDE ANTIBODY, IGG: Cyclic Citrullin Peptide Ab: <16 Units

## 2016-05-30 LAB — ANTI-SCLERODERMA ANTIBODY: Scleroderma (Scl-70) (ENA) Antibody, IgG: <1

## 2016-05-30 LAB — SJOGRENS SYNDROME-A EXTRACTABLE NUCLEAR ANTIBODY: SSA (Ro) (ENA) Antibody, IgG: <1

## 2016-05-30 LAB — ANA: Anti Nuclear Antibody(ANA): NEGATIVE

## 2016-06-03 LAB — HYPERSENSITIVITY PNUEMONITIS PROFILE

## 2016-06-04 ENCOUNTER — Other Ambulatory Visit: Payer: Self-pay | Admitting: Cardiovascular Disease

## 2016-06-04 NOTE — Telephone Encounter (Signed)
Rx(s) sent to pharmacy electronically.  

## 2016-06-07 MED ORDER — ASPIRIN EC 81 MG PO TBEC: 81.0000 mg | DELAYED_RELEASE_TABLET | Freq: Every day | ORAL | 3 refills | Status: DC

## 2016-06-07 NOTE — Telephone Encounter (Signed)
Chads score is 2.  Okay to resume aspirin 81 mg and hold anticoagulation as long as he is maintaining sinus rhythm.

## 2016-06-07 NOTE — Telephone Encounter (Signed)
Spoke to patient. Information given. Patient states he notice when heart rate changes from regular to irregular. Patient states wife ( nurse) monitor heart rate for him.  states he will  Contact office . Has follow up appointment in 07/2016.  Medication list reflect above information

## 2016-06-13 ENCOUNTER — Ambulatory Visit (INDEPENDENT_AMBULATORY_CARE_PROVIDER_SITE_OTHER)
Admission: RE | Admit: 2016-06-13 | Discharge: 2016-06-13 | Disposition: A | Discharge status: Home / Self Care | Payer: PPO | Source: Ambulatory Visit | Attending: Pulmonary Disease | Admitting: Pulmonary Disease

## 2016-06-13 DIAGNOSIS — J849 Interstitial pulmonary disease, unspecified: Secondary | ICD-10-CM | POA: Diagnosis not present

## 2016-06-13 DIAGNOSIS — R0602 Shortness of breath: Secondary | ICD-10-CM | POA: Diagnosis not present

## 2016-06-14 ENCOUNTER — Telehealth: Payer: Self-pay | Admitting: Pulmonary Disease

## 2016-06-14 NOTE — Telephone Encounter (Signed)
I called [name] to let him know I see significant improvement in his high resolution CT scan of his chest.  He says that he is feeling better.  Given this, will hold off on an open lung biopsy at this time but will continue close monitoring in clinic. Will make sure he has a repeat PFT 6 months from the last.

## 2016-07-16 ENCOUNTER — Ambulatory Visit (INDEPENDENT_AMBULATORY_CARE_PROVIDER_SITE_OTHER): Payer: PPO | Admitting: Cardiovascular Disease

## 2016-07-16 ENCOUNTER — Encounter: Payer: Self-pay | Admitting: Cardiovascular Disease

## 2016-07-16 VITALS — BP 122/66 | HR 79 | Ht 69.0 in | Wt 193.0 lb

## 2016-07-16 DIAGNOSIS — I10 Essential (primary) hypertension: Secondary | ICD-10-CM | POA: Diagnosis not present

## 2016-07-16 DIAGNOSIS — E782 Mixed hyperlipidemia: Secondary | ICD-10-CM

## 2016-07-16 DIAGNOSIS — J189 Pneumonia, unspecified organism: Secondary | ICD-10-CM | POA: Diagnosis not present

## 2016-07-16 DIAGNOSIS — M353 Polymyalgia rheumatica: Secondary | ICD-10-CM | POA: Diagnosis not present

## 2016-07-16 DIAGNOSIS — Z79899 Other long term (current) drug therapy: Secondary | ICD-10-CM

## 2016-07-16 DIAGNOSIS — I700 Atherosclerosis of aorta: Secondary | ICD-10-CM | POA: Diagnosis not present

## 2016-07-16 DIAGNOSIS — E785 Hyperlipidemia, unspecified: Secondary | ICD-10-CM | POA: Diagnosis not present

## 2016-07-16 NOTE — Patient Instructions (Signed)
Your physician recommends that you return for lab work  FASTING. Slips provided to you today.  Your physician wants you to follow-up in: 6 months or sooner  If needed. You will receive a reminder letter in the mail two months in advance. If you don't receive a letter, please call our office to schedule the follow-up appointment.   If you need a refill on your cardiac medications before your next appointment, please call your pharmacy.

## 2016-07-16 NOTE — Progress Notes (Signed)
[name], male   DOB: 26-Mar-1946, 71 y.o.   MRN: [id number]    Primary M.D.: Dr. Wende Neighbors  HPI: [name] is a 71 y.o. male who presents for a 4 month cardiology follow-up evaluation.   [name] has remote history of viral myocarditis in 1989 at which time his ejection fraction was 25%. He subsequently normalized LV function. He has a history of mild mitral valve prolapse. Cardiac catheterization in 2001 showed mild mid systolic LAD bridging. He has been noted to have mild T-wave changes on his ECG. Additional problems include hypertension as well as hyperlipidemia. He was unable to tolerate Lipitor. He initially tolerated Crestor but then developed significant myalgias.  He has been able to tolerate Zetia 10 mg daily.   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  Apparently, this has stabilized and is tired.  Ears have almost normalized.  He has been starting to resume very small amounts of red meat.    Past year, he has remained active.  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  He denies any episodes of chest pain, or change in exercise tolerance.  On 02/01/2015.  He underwent lumbar surgery by [name] involving L2-L3, L3-L4, and L4-L5.  He tolerated surgery without cardiovascular compromise.  When I last saw him in January 2017 he was on amlodipine 2.5 mg and atenolol 25 g twice a day, which has been helpful for his blood pressure, palpitations, as well as documented systolic LAD muscle bridging.    Over the past month, he has a definite change him to hematology and has developed progressive shortness of breath with activity.  He denies any significant chest pain.  He has seen [name], [facility] and when he was last seen by him one week ago I  went over his symptom complex and recommended a workup strategy.  He underwent an echo Doppler study which was done on 03/22/2016 and this showed normal systolic  function with an EF of 60-65% with grade 1 diastolic dysfunction.  His aorta was upper normal to mildly dilated at 38 mm.  There was no significant valvular pathology.  There was no TR Doppler jets an estimated PA systolic pressure was not able to be obtained.  Due to his shortness of breath.  He also underwent a CT of his chest.  He had dependent linear and platelike atelectasis in lower lungs bilaterally.  The patient has a remote exposure to asbestosis in his 50s, but his CT scan did not reveal any evidence for pleural based calcification.  There were diffuse atelectatic changes in the lungs bilaterally.  He also underwent a nuclear perfusion study which showed normal perfusion and function without scar or ischemia.  I had sent off laboratory was notable for a normal BMP 53.  Troponins were negative.  He had been prescribed levaquin by his primary physician as empiric therapy for a mild fever.  His fever resolved but he continues to experience shortness of breath.  His white blood count 13 days ago was 11.2.  He was macrocytic with an MCV of 102 with a normal hemoglobin at 16.2, hematocrit of 46.0.  B12 and folate levels were normal.  An erythrocyte sedimentation rate was increased at 61 and a high-sensitivity C-reactive protein was significantly increased at 139.  He presents for evaluation.  When I last saw him, I referred him to [name] for  pulmonary evaluation for possible interstitial lung disease and also [name] for rheumatologic follow-up.  He was given increased steroids.  In November 2017.  A high resolution CT scan of the chest showed patchy groundglass bilaterally.  It last patchy lower lobe atelectasis.  A barium swallow did not show evidence for esophageal disorder.  Antineutrophil cytoplasmic antibody testing was negative.  A follow-up high-resolution CT was done on January 30 118, which showed mild basilar predominant subpleural reticulation and groundglass suggesting nonspecific  interstitial pneumonitis, however, will interstitial pneumonitis could not be excluded.  He was also noted to have aortic atherosclerosis and a punctate stone in his left kidney.  [name] has felt improved.  He is much less short of breath.  He denies chest tightness.  He is unaware of any arrhythmias.  Past Medical History:  Diagnosis Date  . Arthritis   . Digestive problems    on Prednisone prn for this issue- diarrhea  . Dysrhythmia    irregular due to Myocarditis- takes Atenolol, Norvasc  . GERD (gastroesophageal reflux disease)   . H/O viral myocarditis    25 years ago  . Head injury, closed, with concussion    Bre[place] LOC  . Hyperlipidemia   . Mild mitral valve prolapse    per Dr Evette Georges notes  . Polymyalgia rheumatica (HCC)     Past Surgical History:  Procedure Laterality Date  . apendectomy  1965  . APPENDECTOMY    . bone spur Bilateral 1999   feet  . CARDIAC CATHETERIZATION  2001  . CHOLECYSTECTOMY  2004  . COLONOSCOPY    . LUMBAR LAMINECTOMY/ DECOMPRESSION WITH MET-RX Right 05/05/2013   Procedure: Right Lumbar three-four Extraforaminal Microdiskectomy with Metrex;  Surgeon: [name], MD;  Location: [facility];  Service: Neurosurgery;  Laterality: Right;  Right Lumbar three-four Extraforaminal Microdiskectomy with Metrex  . SHOULDER OPEN ROTATOR CUFF REPAIR Bilateral 2001  . TONSILLECTOMY      Allergies  Allergen Reactions  . Diltiazem Hcl Er Coated Beads Shortness Of Breath  . Lidocaine Hcl Anaphylaxis    Xylocaine  . Codeine Nausea And Vomiting  . Crestor [Rosuvastatin Calcium]     Body Aches  . Percocet [Oxycodone-Acetaminophen] Itching    Current Outpatient Prescriptions  Medication Sig Dispense Refill  . acetaminophen (TYLENOL) 500 MG tablet Take 500 mg by mouth every 6 (six) hours as needed.    Marland Kitchen aspirin EC 81 MG tablet Take 1 tablet (81 mg total) by mouth daily. 90 tablet 3  . esomeprazole (NEXIUM) 20 MG packet Take 20 mg by mouth as  needed.     . ezetimibe (ZETIA) 10 MG tablet TAKE ONE (1) TABLET EACH DAY 30 tablet 10  . Multiple Vitamins-Minerals (CENTRUM SILVER 50+MEN PO) Take 1 tablet by mouth daily.    . nebivolol (BYSTOLIC) 2.5 MG tablet Take 1 tablet (2.5 mg total) by mouth daily. Take along with 7m (7.580mtotal) daily 30 tablet 11  . nebivolol (BYSTOLIC) 5 MG tablet Take 1 tablet (5 mg total) by mouth daily. Take along with 2.53m40m7.53mg64mtal) daily 30 tablet 11  . NON FORMULARY Type of Tumeric Take 1 tablet once a day    . ondansetron (ZOFRAN) 4 MG tablet Take 4 mg by mouth every 8 (eight) hours as needed for nausea or vomiting.    . predniSONE (DELTASONE) 10 MG tablet Take 10 mg by mouth daily. 20mg71mered    . tamsulosin (FLOMAX) 0.4 MG CAPS capsule Take 0.4 mg by mouth as  directed.    . cetirizine (ZYRTEC) 10 MG tablet Take 10 mg by mouth daily.     No current facility-administered medications for this visit.     Social History   Social History  . Marital status: Married    Spouse name: N/A  . Number of children: N/A  . Years of education: N/A   Occupational History  . Not on file.   Social History Main Topics  . Smoking status: Former Smoker    Packs/day: 0.02    Years: 1.00    Types: Cigarettes    Quit date: 07/10/1971  . Smokeless tobacco: Never Used     Comment: Patient smoked 1/2 pack per week  . Alcohol use 8.4 oz/week    14 Glasses of wine per week     Comment: 1-2 glasses of wine with evening meal  . Drug use: No  . Sexual activity: Yes    Birth control/ protection: Other-see comments     Comment: old age   Other Topics Concern  . Not on file   Social History Narrative  . 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 He is married has 2 children. There is no tobacco use. He stays active. There is no alcohol use.  He is still working but had a significantly reduced pace than he had previously.  Family history is notable that both parents are deceased.  Mother died of old  age.  Father died but had a history of mitral valve prolapse.  He has 2 brothers and one sister who are  alive and well.   ROS General: Negative; No fevers, chills, or night sweats;  HEENT: Negative; No changes in vision or hearing, sinus congestion, difficulty swallowing Pulmonary:  shortness of breath with activity Cardiovascular: See history of present illness GI: No recent diarrhea. GU: Negative; No dysuria, hematuria, or difficulty voiding Musculoskeletal: Negative; no myalgias, joint pain, or weakness Hematologic/Oncology: Negative; no easy bruising, bleeding Endocrine: Negative; no heat/cold intolerance; no diabetes Neuro: Negative; no changes in balance, headaches Skin: Negative; No rashes or skin lesions Psychiatric: Negative; No behavioral problems, depression Sleep: Negative; No snoring, daytime sleepiness, hypersomnolence, bruxism, restless legs, hypnogognic hallucinations, no cataplexy Other comprehensive 14 point system review is negative.  PE BP 122/66   Pulse 79   Ht 5\' 9"  (1.753 m)   Wt 193 lb (87.5 kg)   BMI 28.50 kg/m    Repeat blood pressure by me 128/70.  Wt Readings from Last 3 Encounters:  07/19/16 195 lb 6.4 oz (88.6 kg)  07/16/16 193 lb (87.5 kg)  05/29/16 192 lb 6.4 oz (87.3 kg)   General: Alert, oriented, no distress.  Skin: normal turgor, no rashes HEENT: Normocephalic, atraumatic. Pupils round and reactive; sclera anicteric;no lid lag.  Nose without nasal septal hypertrophy Mouth/Parynx benign; Mallinpatti scale 2 Neck: No JVD, no carotid bruits with normal carotid upstroke Lungs: Bibasilar possible fibrotic changes without wet sounding rales Chest wall: Nontender to palpation Heart: RRR, s1 s2 normal 1/6 systolic murmur, no S3 gallop.  No diastolic murmur.  No rubs thrills or heaves. Abdomen: soft, nontender; no hepatosplenomehaly, BS+; abdominal aorta nontender and not dilated by palpation. Back: No CVA tenderness Pulses 2+ Extremities: no  clubbing cyanosis or edema, Homan's sign negative  Neurologic: grossly nonfocal; cranial nerves intact Psychologic: normal affect and mood.  ECG (independently read by me): Normal sinus rhythm at 79 bpm.  QTc interval 460 ms.  PR interval normal at 158 ms.  November  2017 ECG (independently read by me): Normal sinus rhythm at 99 bpm, incomplete right bundle branch block.  Nondiagnostic ST-T changes.  January 2017 ECG (independently read by me): Normal sinus rhythm at 82 bpm.  Mild RV conduction delay.  Normal intervals.  No significant ST-T changes.  December 2015 ECG (independently read by me): Normal sinus rhythm at 74 bpm.  Mild RV conduction delay.  QTc interval 461 ms.  December 2014 ECG: Sinus rhythm at 57 beats per minute. No ectopy. Normal intervals.  LABS:  BMP Latest Ref Rng & Units 07/16/2016 04/02/2016 01/24/2015  Glucose 65 - 99 mg/dL 89 132(H) 110(H)  BUN 7 - 25 mg/dL _0 Creatinine 0.70 - 1.25 mg/dL 0.95 0.91 0.87  Sodium 135 - 146 mmol/L 141 137 140  Potassium 3.5 - 5.3 mmol/L 4.0 4.7 4.2  Chloride 98 - 110 mmol/L 105 101 106  CO2 20 - 31 mmol/L _1 Calcium 8.6 - 10.3 mg/dL 8.7 9.1 9.4   Hepatic Function Latest Ref Rng & Units 07/16/2016 09/16/2012 01/22/2007  Total Protein 6.1 - 8.1 g/dL 6.0(L) 5.9(L) 6.5  Albumin 3.6 - 5.1 g/dL 3.8 3.9 3.8  AST 10 - 35 U/L _2 ALT 9 - 46 U/L 13 35 21  Alk Phosphatase 40 - 115 U/L 65 48 88  Total Bilirubin 0.2 - 1.2 mg/dL 0.7 1.0 1.4(H)  Bilirubin, Direct 0.0 - 0.3 mg/dL - 0.1 -   CBC Latest Ref Rng & Units 07/16/2016 04/02/2016 03/28/2016  WBC 3.8 - 10.8 K/uL 4.9 9.7 11.2(H)  Hemoglobin 13.2 - 17.1 g/dL 17.4(H) 16.2 15.5  Hematocrit 38.5 - 50.0 % 50.2(H) 46.0 44.8  Platelets 140 - 400 K/uL 176 304 212   Lab Results  Component Value Date   MCV 100.8 (H) 07/16/2016   MCV 102.7 (H) 04/02/2016   MCV 102.1 (H) 03/28/2016   Erythrocyte Sedimentation Rate     Component Value Date/Time   ESRSEDRATE 1 07/16/2016 0902    CRP November 2017: 139  Repeat CRP 07/16/16:  6  Lab Results  Component Value Date   TSH 2.31 07/16/2016     No results found for: HGBA1C   Lipid Panel     Component Value Date/Time   CHOL 222 (H) 07/16/2016 0902   TRIG 359 (H) 07/16/2016 0902   HDL 67 07/16/2016 0902   CHOLHDL 3.3 07/16/2016 0902   VLDL 72 (H) 07/16/2016 0902   LDLCALC 83 07/16/2016 0902   ------------------------------------------------------------------------------------------------------------- Oxygen Saturation: Rest: 92% with pulse of 93  Following walking in the office: 93% with a pulse of 106  03/21/2016 Nuclear Study Highlights    The left ventricular ejection fraction is normal (55-65%).  Nuclear stress EF: 61%.  The study is normal.  This is a low risk study.  There was no ST segment deviation noted during stress.     ------------------------------------------------------------------- 03/22/2016 ECHO Study Conclusions  - Left ventricle: The cavity size was normal. Wall thickness was   normal. Systolic function was normal. The estimated ejection   fraction was in the range of 60% to 65%. Wall motion was normal;   there were no regional wall motion abnormalities. Doppler   parameters are consistent with abnormal left ventricular   relaxation (grade 1 diastolic dysfunction). - Aortic valve: There was no stenosis. - Aorta: Mildly dilated aortic root. Aortic root dimension: 38 mm   (ED). - Mitral valve: There was no significant regurgitation. - Right ventricle: The cavity size was normal.  Systolic function   was normal. - Pulmonary arteries: No complete TR doppler jet so unable to   estimate PA systolic pressure. - Inferior vena cava: The vessel was normal in size. The   respirophasic diameter changes were in the normal range (>= 50%),   consistent with normal central venous pressure.  Impressions:  - Normal LV size with EF 60-65%. Normal RV size and systolic   function. No  significant valvular abnormalities.  --------------------------------------------------------------------------------------------  04/02/2016 EXAM: CT CHEST WITHOUT CONTRAST  TECHNIQUE: Multidetector CT imaging of the chest was performed following the standard protocol without IV contrast.  COMPARISON:  None.  FINDINGS: Cardiovascular: The heart size is normal. No pericardial effusion. Atherosclerotic calcification is noted in the wall of the thoracic aorta.  Mediastinum/Nodes: No mediastinal lymphadenopathy. No evidence for gross hilar lymphadenopathy although assessment is limited by the lack of intravenous contrast on today's study. There is no axillary lymphadenopathy.  Lungs/Pleura: Fine detail obscured by breathing motion. Dependent linear and platelike atelectasis noted in the lower lungs bilaterally. Components of associated parenchymal scarring not excluded. No evidence for pulmonary edema. No dense focal airspace consolidation. No evidence for pleural effusion.  Upper Abdomen: The liver shows diffusely decreased attenuation suggesting steatosis. Gallbladder surgically absent.  Musculoskeletal: Degenerative changes noted in the shoulders bilaterally.  IMPRESSION: Relatively diffuse atelectatic changes in the lungs bilaterally. Study degraded by patient breathing motion.  No specific findings to explain the patient's history of shortness of breath.   06/13/2016 CT Chest High Resolution  FINDINGS: Cardiovascular: Atherosclerotic calcification of the arterial vasculature. Heart size normal. No pericardial effusion.  Mediastinum/Nodes: No pathologically enlarged mediastinal or axillary lymph nodes. Hilar regions are difficult to evaluate without IV contrast. Esophagus is grossly unremarkable.  Lungs/Pleura: Mild basilar predominant subpleural reticulation and ground-glass with minimal architectural distortion. No definite traction  bronchiectasis/bronchiolectasis or honeycombing. Findings are likely unchanged from 04/02/2016 but there was a great deal of respiratory motion on that exam. There may be minimal air trapping. No pleural fluid. Airway is unremarkable.  Upper Abdomen: Visualized portions of the liver, adrenal glands and right kidney are grossly unremarkable. Punctate stone in the left kidney. Visualized portions of the spleen, pancreas, stomach and bowel are grossly unremarkable. Cholecystectomy. No upper abdominal adenopathy.  Musculoskeletal: No worrisome lytic or sclerotic lesions. Degenerative changes are seen in the spine.  IMPRESSION: 1. Mild basilar predominant subpleural reticulation and ground-glass, suggesting nonspecific interstitial pneumonitis. Usual interstitial pneumonitis cannot be excluded on this baseline examination. 2.  Aortic atherosclerosis (ICD10-170.0). 3. Punctate stone left kidney.   IMPRESSION:  1. Essential hypertension   2. Mixed hyperlipidemia   3. Polymyalgia rheumatica ([place])   4. Medication management   5. Pneumonitis   6. Aortic atherosclerosis (HCC)     ASSESSMENT AND PLAN: Mr. Marsha Hillman is a 71 year old Caucasian male who has a remote history of viral myocarditis associated with an ejection fraction of 25% in 1989.  His echo Doppler study in October 2011 showed an ejection fraction greater than 55% with mild TR.  He is not having any palpitations on his current dose of atenolol 25 mg twice a day.  He has a history of mitral valve prolapse involving the anterior mitral valve leaflet which was seen on prior echo Doppler studies.  His father also had mitral valve prolapse and is deceased.  He previously had undergone cardiac catheterization by me in 2001, which showed mild mid systolic bridging.  He has had chronic T-wave abnormalities on his ECG.  He  developed a significant change in symptoms in the fall of 2017 with  progressive increasing shortness of  breath with activity.  He also had a low-grade fever and was empirically treated with Levaquin by his primary physician with resolution of the fever, but with continued dyspnea.  He admits to exposure to as best stenosis when he was in his 19s.  A CT of his chest showed basilar diffuse atelectasis.  There is no evidence for pleural based calcifications.  I reviewed with him in detail.  His most recent echo Doppler study, which again shows normal systolic function and only grade 1 diastolic dysfunction, which would not explain his significant dyspnea.  In addition, his nuclear perfusion study continues to show normal perfusion without scar or ischemia.  His BMP is normal and are use against a cardiac etiology to his dyspnea.  He has been followed by Dr. Charlestine Night and has a presumptive diagnosis of possible polymyalgia rheumatica.  I had ordered an erythrocyte sedimentation rate which was significantly increased at 61 and his high-sensitivity C-reactive protein was markedly elevated at 139.  Long time with him today in the office.  Oxygen saturation at rest was 92% with a heart rate of 93% and after walking.  His O2 sat duration was 93% and pulse rate had increased to 106.  I recommended slight titration of Bystolic to 7.5 mg from his present dose of 5 mg.  I referred him back to Dr. Charlestine Night back for rheumatologic evaluation since I suspect there may be a significant inflammatory component contributimg to his dyspnea.  There is no definitive history of rheumatoid arthritis with rheumatoid lung disease.  I referred him to [name] to evaluate for possible interstitial fibrosis/ pneumonitis or other pulmonary etiologies for his new onset  dyspnea.  I have reviewed their records and his follow-up high-resolution CT scan.  He was treated with increased steroid regimen.  Clinically, he feels significantly improved.  He has not been given a definitive diagnosis.  I repeated a C-reactive protein today and this was markedly  improved at 6.8 compared to 139, 3 months ago.  In addition, his sedimentation rate is now 1 as compared to 61,  3 months ago.  It appears that his inflammation has significantly resolved.  He will be following up with [name].  He tells me Dr. Charlestine Night will be retiring.  I sent off a lipid panel today.  After the office visit, the results came back elevated with a total cholesterol 222, triglycerides 359, VLDL 72, consistent with an atherogenic profile.  LFTs are normal.  With his aortic atherosclerosis we will contact the patient and initiate rosuvastatin 20 mg if he is able to tolerate statin as well as omega-3 fatty acids at least 2 capsules daily.  He will need follow-up lipid studies in several months.  I will see him in 6 months for follow-up neurologic evaluation.  Time spent: 30 minutes  Troy Sine, MD, [facility]  07/22/2016 11:51 AM

## 2016-07-17 LAB — LIPID PANEL
Cholesterol: 222 mg/dL — ABNORMAL HIGH (ref <200)
HDL: 67 mg/dL (ref >40)
LDL Cholesterol: 83 mg/dL (ref <100)
Total CHOL/HDL Ratio: 3.3 Ratio (ref <5.0)
Triglycerides: 359 mg/dL — ABNORMAL HIGH (ref <150)
VLDL: 72 mg/dL — ABNORMAL HIGH (ref <30)

## 2016-07-17 LAB — COMPREHENSIVE METABOLIC PANEL
ALT: 13 U/L (ref 9–46)
AST: 18 U/L (ref 10–35)
Albumin: 3.8 g/dL (ref 3.6–5.1)
Alkaline Phosphatase: 65 U/L (ref 40–115)
BUN: 9 mg/dL (ref 7–25)
CO2: 23 mmol/L (ref 20–31)
Calcium: 8.7 mg/dL (ref 8.6–10.3)
Chloride: 105 mmol/L (ref 98–110)
Creat: 0.95 mg/dL (ref 0.70–1.25)
Glucose, Bld: 89 mg/dL (ref 65–99)
Potassium: 4 mmol/L (ref 3.5–5.3)
Sodium: 141 mmol/L (ref 135–146)
Total Bilirubin: 0.7 mg/dL (ref 0.2–1.2)
Total Protein: 6 g/dL — ABNORMAL LOW (ref 6.1–8.1)

## 2016-07-17 LAB — CBC
HCT: 50.2 % — ABNORMAL HIGH (ref 38.5–50.0)
Hemoglobin: 17.4 g/dL — ABNORMAL HIGH (ref 13.2–17.1)
MCH: 34.9 pg — ABNORMAL HIGH (ref 27.0–33.0)
MCHC: 34.7 g/dL (ref 32.0–36.0)
MCV: 100.8 fL — ABNORMAL HIGH (ref 80.0–100.0)
MPV: 9.6 fL (ref 7.5–12.5)
Platelets: 176 10*3/uL (ref 140–400)
RBC: 4.98 MIL/uL (ref 4.20–5.80)
RDW: 13.5 % (ref 11.0–15.0)
WBC: 4.9 10*3/uL (ref 3.8–10.8)

## 2016-07-17 LAB — TSH: TSH: 2.31 mIU/L (ref 0.40–4.50)

## 2016-07-18 LAB — C-REACTIVE PROTEIN: CRP: 6.8 mg/L (ref <8.0)

## 2016-07-18 LAB — SEDIMENTATION RATE: Sed Rate: 1 mm/hr (ref 0–20)

## 2016-07-19 ENCOUNTER — Encounter: Payer: Self-pay | Admitting: Pulmonary Disease

## 2016-07-19 ENCOUNTER — Ambulatory Visit (INDEPENDENT_AMBULATORY_CARE_PROVIDER_SITE_OTHER): Payer: PPO | Admitting: Pulmonary Disease

## 2016-07-19 VITALS — BP 130/80 | HR 71 | Ht 69.0 in | Wt 195.4 lb

## 2016-07-19 DIAGNOSIS — J849 Interstitial pulmonary disease, unspecified: Secondary | ICD-10-CM | POA: Diagnosis not present

## 2016-07-19 NOTE — Assessment & Plan Note (Signed)
He has significantly improved imaging findings and symptoms compared to late 2017 when I first met him. My hope is that he had some sort of a viral pneumonitis that caused the dramatic findings on his original testing.  However, I explained to he and his wife today that they're still some mild residual changes. I'm very encouraged by the improvement in these findings and his symptoms.  The only way to know for certain what is causing these would be to perform an open lung biopsy, but given his improvement in both imaging and clinical findings I don't think this is appropriate right now.  We need to continue following this with objective testing (6 minute walk in lung function testing.  Plan: Repeat 6 minute walk in May, lung function testing in May with an office visit with one of our nurse practitioners He will let us know if he develops any respiratory symptoms between now and then If his lung function testing is stable at that point and he'll follow-up in another 6 months with me with similar testing  > 50 % of this 26 minute visit spent face to face

## 2016-07-19 NOTE — Patient Instructions (Signed)
Please let us know if you have worsening shortness of breath, cough or other respiratory symptoms We will have you come back in May and see our nurse practitioner, at that visit he will have a lung function test and a 6 minute walk test I will then see you in November 2018 with an office visit here in the same studies.

## 2016-07-19 NOTE — Progress Notes (Signed)
Subjective:    [name], male    DOB: 10/12/1945, 71 y.o.   MRN: [id number]  Synopsis: Referred in November 2017 for evaluation of shortness of breath.   HPI Chief Complaint  Patient presents with  . Follow-up    breathing is better but not completely back to normal, still SOB upon exertion   [name] has done well since the last visit. He says that he still has some shortness of breath when he bends over and pick something up, but in general he is much better than before. He says that he's not been very active. He doesn't exercise nor do yard work. However, in general his symptoms have improved significantly since when we first met him in late 2017. He can do more now without feeling short of breath. He has a cough sometimes in the mornings.  Past Medical History:  Diagnosis Date  . Arthritis   . Digestive problems    on Prednisone prn for this issue- diarrhea  . Dysrhythmia    irregular due to Myocarditis- takes Atenolol, Norvasc  . GERD (gastroesophageal reflux disease)   . H/O viral myocarditis    25 years ago  . Head injury, closed, with concussion    Breif LOC  . Hyperlipidemia   . Mild mitral valve prolapse    per Dr Evette Georges notes  . Polymyalgia rheumatica (HCC)      Review of Systems  Constitutional: Negative for chills, diaphoresis, fatigue and fever.  HENT: Negative for rhinorrhea, sinus pain and sinus pressure.   Respiratory: Negative for cough, shortness of breath and wheezing.   Cardiovascular: Negative for chest pain and leg swelling.       Objective:   Physical Exam  Vitals:   07/19/16 1645  BP: 130/80  Pulse: 71  SpO2: 95%  Weight: 195 lb 6.4 oz (88.6 kg)  Height: _0  (1.753 m)  RA  Ambulated 500 feet on RA and his O2 saturation remained above 94%  Gen: well appearing HENT: OP clear, TM's clear, neck supple PULM: Scant crackles Bases, normal percussion CV: RRR, no mgr, trace edema GI: BS+, soft, nontender Derm: no  cyanosis or rash Psyche: normal mood and affect   Imaging: November 2017 high-resolution CT scan of the chest images personally reviewed showing patchy groundglass bilaterally which appears predominantly to be patchy lower lobe atelectasis  Barium swallow from December 2017 no evidence of esophageal disorder. January 2018 high-resolution CT scan of the chest showed mild basilar predominant subpleural reticulation and groundglass suggesting nonspecific interstitial pneumonitis. (images independently reviewed 07/19/2016)   blood work  November 2017 reviewed: CRP 139, total CK normal, antineutrophil cytoplasmic antibody testing negative   Pulmonary function testing 04/2016 Ratio 83%, FEV1 3.02 L 93%, FVC 3.06 L , total lung capacity 5. 12/17/1982 percent protected, DLCO 23.9 75% predicted     Assessment & Plan:  ILD (interstitial lung disease) ([place]) He has significantly improved imaging findings and symptoms compared to late 2017 when I first met him. My hope is that he had some sort of a viral pneumonitis that caused the dramatic findings on his original testing.  However, I explained to he and his wife today that they're still some mild residual changes. I'm very encouraged by the improvement in these findings and his symptoms.  The only way to know for certain what is causing these would be to perform an open lung biopsy, but given his improvement in both imaging and clinical findings I don't think  this is appropriate right now.  We need to continue following this with objective testing (6 minute walk in lung function testing.  Plan: Repeat 6 minute walk in May, lung function testing in May with an office visit with one of our nurse practitioners He will let us know if he develops any respiratory symptoms between now and then If his lung function testing is stable at that point and he'll follow-up in another 6 months with me with similar testing  > 50 % of this 26 minute visit spent face  to face    Current Outpatient Prescriptions:  .  acetaminophen (TYLENOL) 500 MG tablet, Take 500 mg by mouth every 6 (six) hours as needed., Disp: , Rfl:  .  aspirin EC 81 MG tablet, Take 1 tablet (81 mg total) by mouth daily., Disp: 90 tablet, Rfl: 3 .  cetirizine (ZYRTEC) 10 MG tablet, Take 10 mg by mouth daily., Disp: , Rfl:  .  ezetimibe (ZETIA) 10 MG tablet, TAKE ONE (1) TABLET EACH DAY, Disp: 30 tablet, Rfl: 10 .  Multiple Vitamins-Minerals (CENTRUM SILVER 50+MEN PO), Take 1 tablet by mouth daily., Disp: , Rfl:  .  nebivolol (BYSTOLIC) 2.5 MG tablet, Take 1 tablet (2.5 mg total) by mouth daily. Take along with 53m (7.546mtotal) daily, Disp: 30 tablet, Rfl: 11 .  nebivolol (BYSTOLIC) 5 MG tablet, Take 1 tablet (5 mg total) by mouth daily. Take along with 2.41m24m7.41mg68mtal) daily, Disp: 30 tablet, Rfl: 11 .  NON FORMULARY, Type of Tumeric Take 1 tablet once a day, Disp: , Rfl:  .  ondansetron (ZOFRAN) 4 MG tablet, Take 4 mg by mouth every 8 (eight) hours as needed for nausea or vomiting., Disp: , Rfl:  .  predniSONE (DELTASONE) 10 MG tablet, Take 10 mg by mouth daily. 20mg50mered, Disp: , Rfl:  .  tamsulosin (FLOMAX) 0.4 MG CAPS capsule, Take 0.4 mg by mouth as directed., Disp: , Rfl:  .  esomeprazole (NEXIUM) 20 MG packet, Take 20 mg by mouth as needed. , Disp: , Rfl:

## 2016-07-26 ENCOUNTER — Other Ambulatory Visit: Payer: Self-pay | Admitting: *Deleted

## 2016-07-26 ENCOUNTER — Telehealth: Payer: Self-pay | Admitting: *Deleted

## 2016-07-26 NOTE — Telephone Encounter (Signed)
-----  Message from Troy Sine, MD sent at 07/22/2016 11:53 AM EDT ----- ESR is now normal.  C-reactive protein is markedly improved.  Lipids are significantly elevated.  LFTs are normal.  Start Crestor 20 mg and 2 capsules of omega-3 fatty acids daily.  Repeat lipid panel in 2-3 months with LFTs

## 2016-07-26 NOTE — Telephone Encounter (Signed)
Left message on patient's voice mail ( okay per DPR)  That since I could not reach him,to look on mychart for lab results and recommendations. If he cannot access his records to return a call and I will discuss them with him.

## 2016-07-27 DIAGNOSIS — M5136 Other intervertebral disc degeneration, lumbar region: Secondary | ICD-10-CM | POA: Diagnosis not present

## 2016-07-27 DIAGNOSIS — M4726 Other spondylosis with radiculopathy, lumbar region: Secondary | ICD-10-CM | POA: Diagnosis not present

## 2016-07-27 DIAGNOSIS — Z981 Arthrodesis status: Secondary | ICD-10-CM | POA: Diagnosis not present

## 2016-07-27 DIAGNOSIS — M48061 Spinal stenosis, lumbar region without neurogenic claudication: Secondary | ICD-10-CM | POA: Diagnosis not present

## 2016-07-27 DIAGNOSIS — M5416 Radiculopathy, lumbar region: Secondary | ICD-10-CM | POA: Diagnosis not present

## 2016-08-02 MED ORDER — ROSUVASTATIN CALCIUM 20 MG PO TABS: 20.0000 mg | ORAL_TABLET | Freq: Every day | ORAL | 6 refills | Status: DC

## 2016-08-11 ENCOUNTER — Encounter (HOSPITAL_COMMUNITY): Payer: Self-pay | Admitting: *Deleted

## 2016-08-11 ENCOUNTER — Emergency Department (HOSPITAL_COMMUNITY): Payer: PPO

## 2016-08-11 ENCOUNTER — Emergency Department (HOSPITAL_COMMUNITY)
Admission: EM | Admit: 2016-08-11 | Discharge: 2016-08-11 | Disposition: A | Discharge status: Home / Self Care | Payer: PPO | Attending: Emergency Medicine | Admitting: Emergency Medicine

## 2016-08-11 DIAGNOSIS — I480 Paroxysmal atrial fibrillation: Secondary | ICD-10-CM | POA: Insufficient documentation

## 2016-08-11 DIAGNOSIS — Z7982 Long term (current) use of aspirin: Secondary | ICD-10-CM | POA: Insufficient documentation

## 2016-08-11 DIAGNOSIS — I10 Essential (primary) hypertension: Secondary | ICD-10-CM | POA: Diagnosis not present

## 2016-08-11 DIAGNOSIS — Z79899 Other long term (current) drug therapy: Secondary | ICD-10-CM | POA: Diagnosis not present

## 2016-08-11 DIAGNOSIS — Z87891 Personal history of nicotine dependence: Secondary | ICD-10-CM | POA: Diagnosis not present

## 2016-08-11 DIAGNOSIS — R0789 Other chest pain: Secondary | ICD-10-CM | POA: Diagnosis not present

## 2016-08-11 DIAGNOSIS — I4891 Unspecified atrial fibrillation: Secondary | ICD-10-CM

## 2016-08-11 DIAGNOSIS — R079 Chest pain, unspecified: Secondary | ICD-10-CM | POA: Diagnosis not present

## 2016-08-11 DIAGNOSIS — R000 Tachycardia, unspecified: Secondary | ICD-10-CM | POA: Diagnosis not present

## 2016-08-11 LAB — BASIC METABOLIC PANEL
Anion gap: 11 (ref 5–15)
BUN: 19 mg/dL (ref 6–20)
CO2: 20 mmol/L — ABNORMAL LOW (ref 22–32)
Calcium: 9 mg/dL (ref 8.9–10.3)
Chloride: 105 mmol/L (ref 101–111)
Creatinine, Ser: 1.03 mg/dL (ref 0.61–1.24)
GFR calc Af Amer: >60 mL/min (ref >60)
GFR calc non Af Amer: >60 mL/min (ref >60)
Glucose, Bld: 147 mg/dL — ABNORMAL HIGH (ref 65–99)
Potassium: 4.3 mmol/L (ref 3.5–5.1)
Sodium: 136 mmol/L (ref 135–145)

## 2016-08-11 LAB — CBC
HCT: 50.4 % (ref 39.0–52.0)
Hemoglobin: 18.4 g/dL — ABNORMAL HIGH (ref 13.0–17.0)
MCH: 36.4 pg — ABNORMAL HIGH (ref 26.0–34.0)
MCHC: 36.5 g/dL — ABNORMAL HIGH (ref 30.0–36.0)
MCV: 99.6 fL (ref 78.0–100.0)
Platelets: 182 10*3/uL (ref 150–400)
RBC: 5.06 MIL/uL (ref 4.22–5.81)
RDW: 13.3 % (ref 11.5–15.5)
WBC: 10.5 10*3/uL (ref 4.0–10.5)

## 2016-08-11 LAB — TROPONIN I: Troponin I: <0.03 ng/mL (ref <0.03)

## 2016-08-11 LAB — I-STAT TROPONIN, ED: Troponin i, poc: 0.01 ng/mL (ref 0.00–0.08)

## 2016-08-11 MED ORDER — DILTIAZEM HCL ER 60 MG PO CP12
240.0000 mg | ORAL_CAPSULE | Freq: Once | ORAL | Status: DC
  Filled 2016-08-11: qty 1

## 2016-08-11 MED ORDER — APIXABAN 5 MG PO TABS: 5.0000 mg | ORAL_TABLET | Freq: Two times a day (BID) | ORAL | 0 refills | Status: DC

## 2016-08-11 MED ORDER — APIXABAN 5 MG PO TABS
5.0000 mg | ORAL_TABLET | Freq: Once | ORAL | Status: AC
  Administered 2016-08-11: 5 mg via ORAL
  Filled 2016-08-11: qty 1

## 2016-08-11 MED ORDER — DILTIAZEM HCL ER COATED BEADS 240 MG PO CP24
240.0000 mg | ORAL_CAPSULE | Freq: Once | ORAL | Status: AC
  Administered 2016-08-11: 240 mg via ORAL
  Filled 2016-08-11: qty 1

## 2016-08-11 MED ORDER — DILTIAZEM LOAD VIA INFUSION
10.0000 mg | Freq: Once | INTRAVENOUS | Status: AC
  Administered 2016-08-11: 10 mg via INTRAVENOUS
  Filled 2016-08-11: qty 10

## 2016-08-11 MED ORDER — DILTIAZEM HCL ER COATED BEADS 240 MG PO TB24: 240.0000 mg | ORAL_TABLET | Freq: Every day | ORAL | 0 refills | Status: DC

## 2016-08-11 MED ORDER — DILTIAZEM HCL-DEXTROSE 100-5 MG/100ML-% IV SOLN (PREMIX)
5.0000 mg/h | INTRAVENOUS | Status: DC
  Administered 2016-08-11: 5 mg/h via INTRAVENOUS
  Filled 2016-08-11: qty 100

## 2016-08-11 NOTE — ED Notes (Signed)
ED Provider at bedside. 

## 2016-08-11 NOTE — Progress Notes (Signed)
Called regarding patient seen in the [place]. He woke up with chest pain and came to the ER - found to be in a-fib with RVR. This is a recurrent issue, was evaluated for this in the fall and had a nuclear stress test which was negative for ischemia. He was on Xarelto - switched to Eliquis and it was eventually discontinued. He was also on atenolol and ditiazem for rate control. He thought he was dyspneic related to meds and they were stopped and switched to Bystolic (which is not a good b-blocker for rate control). Apparently he also had some type of atypical pneumonia that time as well. He currently feels better with HR control around 90-100 on diltiazem 10 mg/hr. Per the ER physician report, he eagerly wants to be discharged. Initial troponin is negative.   I would recommend the following:  1. Start diltiazem 240 mg po now and overlap diltiazem gtts for 1 hour - if rate controlled, then d/c diltiazem gtts. 2. Start Eliquis 5 mg po BID - 1st dose this morning 3. D/c bystolic for room in blood pressure on diltiazem  Our office will contact him next week for follow-up.  Pixie Casino, MD, [facility] Attending Cardiologist [place]

## 2016-08-11 NOTE — ED Provider Notes (Addendum)
[name] Provider Note   CSN: [id number] Arrival date & time: 08/11/16  0454  Time seen 05:12 AM   History   Chief Complaint Chief Complaint  Patient presents with  . Chest Pain    HPI OGLE HOEFFNER is a 71 y.o. male.  HPI    patient reports he felt fine yesterday. He states it for a.m. he woke up with left-sided chest pain. He states it radiated into his left arm. He also had some shortness of breath which is better now. He did have dyspnea on exertion. He was diaphoretic. He denies palpitations. He denies nausea or vomiting. He states he took 2 regular aspirin in his symptoms felt better. He states in October he had atrial fibrillation. At that time he had no palpitations. He was treated with IV Cardizem however he was on vacation and wanted to come home and he was discharged from the hospital while still in afib to follow-up with his cardiologist. He states when he saw his cardiologist 3 days later his heart rate had gone back to normal rhythm. He also states at the same time he had a viral and had shortness of breath. He states he had no problems taking the IV Cardizem while in the hospital. He does have Cardizem listed as an allergy however this was also when he was having pneumonia. He denies any cough, sneezing, or fever.  PCP Wende Neighbors, MD Cardiology Dr Linus Salmons  Past Medical History:  Diagnosis Date  . Arthritis   . Digestive problems    on Prednisone prn for this issue- diarrhea  . Dysrhythmia    irregular due to Myocarditis- takes Atenolol, Norvasc  . GERD (gastroesophageal reflux disease)   . H/O viral myocarditis    25 years ago  . Head injury, closed, with concussion    Breif LOC  . Hyperlipidemia   . Mild mitral valve prolapse    per Dr Evette Georges notes  . Polymyalgia rheumatica [facility])     Patient Active Problem List   Diagnosis Date Noted  . ILD (interstitial lung disease) ([place]) 05/01/2016  . Lumbar stenosis 02/01/2015  . Giardia 04/14/2014  .  H/O viral cardiomyopathy 04/14/2014  . Herniated nucleus pulposus, L3-4 right 05/05/2013  . Other hypertrophic cardiomyopathy ([place]) 04/19/2013  . Hyperlipidemia 04/19/2013  . Essential hypertension 04/19/2013  . Abnormal LFTs 09/16/2012  . Nausea 07/10/2011  . Chronic diarrhea 07/10/2011  . Burping 07/10/2011  . Osteoporosis 07/10/2011  . Arthritis 07/10/2011    Past Surgical History:  Procedure Laterality Date  . apendectomy  1965  . APPENDECTOMY    . bone spur Bilateral 1999   feet  . CARDIAC CATHETERIZATION  2001  . CHOLECYSTECTOMY  2004  . COLONOSCOPY    . LUMBAR LAMINECTOMY/ DECOMPRESSION WITH MET-RX Right 05/05/2013   Procedure: Right Lumbar three-four Extraforaminal Microdiskectomy with Metrex;  Surgeon: [name], MD;  Location: [facility];  Service: Neurosurgery;  Laterality: Right;  Right Lumbar three-four Extraforaminal Microdiskectomy with Metrex  . SHOULDER OPEN ROTATOR CUFF REPAIR Bilateral 2001  . TONSILLECTOMY         Home Medications    Prior to Admission medications   Medication Sig Start Date End Date Taking? Authorizing Provider  acetaminophen (TYLENOL) 500 MG tablet Take 500 mg by mouth every 6 (six) hours as needed.   Yes Historical Provider, MD  aspirin EC 81 MG tablet Take 1 tablet (81 mg total) by mouth daily. 06/07/16  Yes Troy Sine, MD  cetirizine (  ZYRTEC) 10 MG tablet Take 10 mg by mouth daily.   Yes Historical Provider, MD  esomeprazole (NEXIUM) 20 MG packet Take 20 mg by mouth as needed.    Yes Historical Provider, MD  ezetimibe (ZETIA) 10 MG tablet TAKE ONE (1) TABLET EACH DAY 06/04/16  Yes Troy Sine, MD  Multiple Vitamins-Minerals (CENTRUM SILVER 50+MEN PO) Take 1 tablet by mouth daily.   Yes Historical Provider, MD  nebivolol (BYSTOLIC) 2.5 MG tablet Take 1 tablet (2.5 mg total) by mouth daily. Take along with 74m (7.569mtotal) daily 04/11/16  Yes ThTroy SineMD  nebivolol (BYSTOLIC) 5 MG tablet Take 1 tablet (5 mg total) by  mouth daily. Take along with 2.57m9m7.57mg64mtal) daily 04/11/16  Yes ThomTroy Sine  NON FORMULARY Type of Tumeric Take 1 tablet once a day   Yes Historical Provider, MD  ondansetron (ZOFRAN) 4 MG tablet Take 4 mg by mouth every 8 (eight) hours as needed for nausea or vomiting.   Yes Historical Provider, MD  rosuvastatin (CRESTOR) 20 MG tablet Take 1 tablet (20 mg total) by mouth daily. 08/02/16 10/31/16 Yes ThomTroy Sine  tamsulosin (FLOMAX) 0.4 MG CAPS capsule Take 0.4 mg by mouth as directed.   Yes Historical Provider, MD  apixaban (ELIQUIS) 5 MG TABS tablet Take 1 tablet (5 mg total) by mouth 2 (two) times daily. 08/11/16   Nickalos Petersen Rolland Porter  diltiazem (CARDIZEM LA) 240 MG 24 hr tablet Take 1 tablet (240 mg total) by mouth daily. 08/11/16   Adaora Mchaney Rolland Porter    Family History Family History  Problem Relation Age of Onset  . Rheum arthritis Mother   . Thyroid cancer Mother   . Heart disease Father   . Asthma Father   . Thyroid cancer Sister   . Melanoma Brother     Social History Social History  Substance Use Topics  . Smoking status: Former Smoker    Packs/day: 0.02    Years: 1.00    Types: Cigarettes    Quit date: 07/10/1971  . Smokeless tobacco: Never Used     Comment: Patient smoked 1/2 pack per week  . Alcohol use 8.4 oz/week    14 Glasses of wine per week     Comment: 1-2 glasses of wine with evening meal  lives at home Lives with spouse   Allergies   Diltiazem hcl er coated beads; Lidocaine hcl; Codeine; Crestor [rosuvastatin calcium]; and Percocet [oxycodone-acetaminophen]   Review of Systems Review of Systems  All other systems reviewed and are negative.    Physical Exam Updated Vital Signs ED Triage Vitals  Enc Vitals Group     BP 08/11/16 0540 118/75     Pulse Rate 08/11/16 0515 (!) 150     Resp 08/11/16 0515 (!) 22     Temp --      Temp src --      SpO2 08/11/16 0515 94 %     Weight 08/11/16 0502 190 lb (86.2 kg)     Height 08/11/16 0502 '5\' 9"'   (1.753 m)     Head Circumference --      Peak Flow --      Pain Score 08/11/16 0502 3     Pain Loc --      Pain Edu? --      Excl. in GC? [place]    Vital signs normal except for tachycardia   Physical Exam  Constitutional: He is oriented to person, place, and  time. He appears well-developed and well-nourished.  Non-toxic appearance. He does not appear ill. No distress.  HENT:  Head: Normocephalic and atraumatic.  Right Ear: External ear normal.  Left Ear: External ear normal.  Nose: Nose normal. No mucosal edema or rhinorrhea.  Mouth/Throat: Oropharynx is clear and moist and mucous membranes are normal. No dental abscesses or uvula swelling.  Eyes: Conjunctivae and EOM are normal. Pupils are equal, round, and reactive to light.  Neck: Normal range of motion and full passive range of motion without pain. Neck supple.  Cardiovascular: Normal heart sounds.  An irregularly irregular rhythm present. Tachycardia present.  Exam reveals no gallop and no friction rub.   No murmur heard. Pulmonary/Chest: Effort normal and breath sounds normal. No respiratory distress. He has no wheezes. He has no rhonchi. He has no rales. He exhibits no tenderness and no crepitus.  Abdominal: Soft. Normal appearance and bowel sounds are normal. He exhibits no distension. There is no tenderness. There is no rebound and no guarding.  Musculoskeletal: Normal range of motion. He exhibits no edema or tenderness.  Moves all extremities well.   Neurological: He is alert and oriented to person, place, and time. He has normal strength. No cranial nerve deficit.  Skin: Skin is warm, dry and intact. No rash noted. No erythema. No pallor.  Psychiatric: He has a normal mood and affect. His speech is normal and behavior is normal. His mood appears not anxious.  Nursing note and vitals reviewed.    ED Treatments / Results  Labs (all labs ordered are listed, but only abnormal results are displayed) Results for orders placed  or performed during the hospital encounter of 74/16/38  Basic metabolic panel  Result Value Ref Range   Sodium 136 135 - 145 mmol/L   Potassium 4.3 3.5 - 5.1 mmol/L   Chloride 105 101 - 111 mmol/L   CO2 20 (L) 22 - 32 mmol/L   Glucose, Bld 147 (H) 65 - 99 mg/dL   BUN 19 6 - 20 mg/dL   Creatinine, Ser 1.03 0.61 - 1.24 mg/dL   Calcium 9.0 8.9 - 10.3 mg/dL   GFR calc non Af Amer >60 >60 mL/min   GFR calc Af Amer >60 >60 mL/min   Anion gap 11 5 - 15  CBC  Result Value Ref Range   WBC 10.5 4.0 - 10.5 K/uL   RBC 5.06 4.22 - 5.81 MIL/uL   Hemoglobin 18.4 (H) 13.0 - 17.0 g/dL   HCT 50.4 39.0 - 52.0 %   MCV 99.6 78.0 - 100.0 fL   MCH 36.4 (H) 26.0 - 34.0 pg   MCHC 36.5 (H) 30.0 - 36.0 g/dL   RDW 13.3 11.5 - 15.5 %   Platelets 182 150 - 400 K/uL  I-stat troponin, ED  Result Value Ref Range   Troponin i, poc 0.01 0.00 - 0.08 ng/mL   Comment 3           Laboratory interpretation all normal except hyperglycemia, elevated Hb      EKG  EKG Interpretation  Date/Time:  Saturday August 11 2016 05:03:56 EDT Ventricular Rate:  161 PR Interval:    QRS Duration: 90 QT Interval:  296 QTC Calculation: 508 R Axis:   48 Text Interpretation:  Atrial fibrillation with rapid V-rate Low voltage, precordial leads Repolarization abnormality, prob rate related Baseline wander in lead(s) V3 Since last tracing 25 May 1999 Atrial fibrillation with rapid ventricular response has replaced Normal sinus rhythm Confirmed by Marko Skalski  MD-I, Kienna Moncada ([id number]) on 08/11/2016 5:12:35 AM       Radiology Dg Chest 2 View  Result Date: 08/11/2016 CLINICAL DATA:  Awoke with central chest pain. EXAM: CHEST  2 VIEW COMPARISON:  Chest CT 06/13/2016, radiographs 04/18/2016 FINDINGS: The cardiomediastinal contours are normal. Streaky subsegmental atelectasis or scarring in the left lower lung zone. Pulmonary vasculature is normal. No consolidation, pleural effusion, or pneumothorax. No acute osseous abnormalities are seen.  IMPRESSION: Streaky subsegmental atelectasis or scarring in the left lung. Electronically Signed   By: Jeb Levering M.D.   On: 08/11/2016 05:58    Procedures Procedures (including critical care time)  Medications Ordered in ED Medications  diltiazem (CARDIZEM) 1 mg/mL load via infusion 10 mg (10 mg Intravenous Bolus from Bag 08/11/16 0541)    And  diltiazem (CARDIZEM) 100 mg in dextrose 5% 132m (1 mg/mL) infusion (10 mg/hr Intravenous Rate/Dose Change 08/11/16 0618)  apixaban (ELIQUIS) tablet 5 mg (not administered)  diltiazem (CARDIZEM CD) 24 hr capsule 240 mg (not administered)     Initial Impression / Assessment and Plan / ED Course  I have reviewed the triage vital signs and the nursing notes.  Pertinent labs & imaging results that were available during my care of the patient were reviewed by me and considered in my medical decision making (see chart for details).  Patient was started on Cardizem drip. It does not appear that the Cardizem was causing shortness of breath before, patient states he had pneumonia at that time. He states he had taken the IV form with no problem.  07:10 AM pt on cardizem drip. His HR is 105-125, atrial fibrillation. Pt would like to be discharged home today if possible. He states he has been off the Eliquis since December.   08:25 AM Dr HDebara Pickett Cardiology has reviewed his office visits with Dr KClaiborne Billingsand has reviewed his VS and testing here. He states the patient has been stable and his rate is controlled with the IV cardizem. Feels he should stop his bystolic, start on Cardizem CD 240 mg once a day. Give the first dose in the ED and stop his IV drip in about an hour. Start back on the eliquis 5 mg twice a day. He will send Dr KClaiborne Billingsa note so he can be seen in the office and if still in afib may schedule cardioversion. He also wants a delta troponin to be done.   Final Clinical Impressions(s) / ED Diagnoses   Final diagnoses:  Atrial fibrillation with  RVR (HCC)  Other chest pain    New Prescriptions New Prescriptions   APIXABAN (ELIQUIS) 5 MG TABS TABLET    Take 1 tablet (5 mg total) by mouth 2 (two) times daily.   DILTIAZEM (CARDIZEM LA) 240 MG 24 HR TABLET    Take 1 tablet (240 mg total) by mouth daily.    Plan discharge pending results of delta troponin and oral cardizem.   IRolland Porter MD, FBarbette Or MD 08/11/16 05374   IRolland Porter MD 08/11/16 [phone]

## 2016-08-11 NOTE — ED Triage Notes (Signed)
Pt reports being woken up in his sleep with centralized chest pain that radiates into his left arm. Pt states he got sob and lightheaded. Pt reports aching in his chest that was a 5 on the pain scale. Pt took 2-325mg  aspirin with relief to a 3. Pt states he was diagnosed A-fib in October 2017.

## 2016-08-11 NOTE — ED Provider Notes (Signed)
Patient's hemodynamic be stable. Second troponin negative. Will discharge home with Cardizem LA 240 mg daily and Eliquis 5 mg twice a day.  Discussed with patient and his wife.   Nat Christen, MD 08/11/16 [phone]

## 2016-08-11 NOTE — Discharge Instructions (Signed)
Stop the bystolic. Start the Cardizem daily (you got the first dose this morning). Also continue on the Eliquis twice a day (you had the first dose in the ED today). You should call Dr Evette Georges office on Monday, April 2, to be seen by him to see if you are still in the atrial fibrillation.  Return to the ED if you feel worse again.  No strenuous activity this weekend!

## 2016-08-13 NOTE — Progress Notes (Signed)
Cardiology Office Note    Date:  08/14/2016   ID:  [name], DOB 1946/03/24, MRN [id number]  PCP:  Wende Neighbors, MD  Cardiologist: Dr. Claiborne Billings   Chief Complaint  Patient presents with  . Follow-up    recent Emergency Department visit.     History of Present Illness:    [name] is a 71 y.o. male with past medical history of viral myocarditis (occurring in 1989), HTN, HLD, PAF, PMR and interstitial lung disease who presents to the office today for follow-up from the Emergency Department.   He was recently seen by Dr. Claiborne Billings on 07/16/2016 and reported doing well from a cardiac perspective. He had been undergoing treatment for ILD by Pulmonology and reported significant improvement in his respiratory status. A lipid panel was obtained at that time and showed a total cholesterol 222, triglycerides 359, and LDL 83. He was started on Crestor 19m at that time. Recent NST from 03/2016 reviewed and showed a normal EF with no evidence of ischemia, therefore being a low-risk study.   [facility] evaluation of chest pain with associated dyspnea and diaphoresis. Initial EKG showed atrial fibrillation with RVR, HR 161. Labs with WBC 10.5, Hgb 18.4, platelets 182. K+4.3, creatinine 1.03. Initial and repeat troponin values remained negative. He was initially placed on IV Diltiazem but this was transitioned to PO Cardizem CD 2418mdaily along with being restarted on Eliquis 86m72mID for anticoagulation.   In talking with the patient today, he denies any repeat episodes of chest pain or dyspnea since his recent ER visit. He says the symptoms he experienced that they were very similar to prior episodes of atrial fibrillation.  He reports good compliance with Eliquis and Cardizem. He denies any evidence of active bleeding since starting Eliquis. Was unsure whether or not to continue with ASA since starting Eliquis. He has yet to start Crestor which was recently  prescribed in March due to having significant myalgias with this in the past.   Past Medical History:  Diagnosis Date  . Arthritis   . Chest pain    a. 03/2017: echo showing EF of 60-65%, no regional WMA or significant valve abnormalities. b. 03/2016: NST with no evidence of ischemia.   . Digestive problems    on Prednisone prn for this issue- diarrhea  . Dysrhythmia    irregular due to Myocarditis- takes Atenolol, Norvasc  . GERD (gastroesophageal reflux disease)   . H/O viral myocarditis    25 years ago  . Head injury, closed, with concussion    Breif LOC  . Hyperlipidemia   . Mild mitral valve prolapse    per Dr KelEvette Georgestes  . PAF ([place]) [place]  a. initially occuring in 02/2016. b. recurrent in 07/2016. Placed on Eliquis  . Polymyalgia rheumatica ([facility])     Past Surgical History:  Procedure Laterality Date  . apendectomy  1965  . APPENDECTOMY    . bone spur Bilateral 1999   feet  . CARDIAC CATHETERIZATION  2001  . CHOLECYSTECTOMY  2004  . COLONOSCOPY    . LUMBAR LAMINECTOMY/ DECOMPRESSION WITH MET-RX Right 05/05/2013   Procedure: Right Lumbar three-four Extraforaminal Microdiskectomy with Metrex;  Surgeon: HenKristeen MissD;  [facility];  Service: Neurosurgery;  Laterality: Right;  Right Lumbar three-four Extraforaminal Microdiskectomy with Metrex  . SHOULDER OPEN ROTATOR CUFF REPAIR Bilateral 2001  . TONSILLECTOMY      Current  Medications: Outpatient Medications Prior to Visit  Medication Sig Dispense Refill  . acetaminophen (TYLENOL) 500 MG tablet Take 500 mg by mouth every 6 (six) hours as needed for mild pain.     Marland Kitchen aspirin EC 81 MG tablet Take 1 tablet (81 mg total) by mouth daily. 90 tablet 3  . cetirizine (ZYRTEC) 10 MG tablet Take 10 mg by mouth daily as needed for allergies.     Marland Kitchen esomeprazole (NEXIUM) 20 MG packet Take 20 mg by mouth daily as needed. Acid reflux    . ezetimibe (ZETIA) 10 MG tablet TAKE ONE (1) TABLET  EACH DAY 30 tablet 10  . Multiple Vitamins-Minerals (CENTRUM SILVER 50+MEN PO) Take 1 tablet by mouth daily.    . NON FORMULARY Type of Tumeric Take 1 tablet once a day    . ondansetron (ZOFRAN) 4 MG tablet Take 4 mg by mouth every 8 (eight) hours as needed for nausea or vomiting.    . tamsulosin (FLOMAX) 0.4 MG CAPS capsule Take 0.4 mg by mouth at bedtime.     Marland Kitchen apixaban (ELIQUIS) 5 MG TABS tablet Take 1 tablet (5 mg total) by mouth 2 (two) times daily. 60 tablet 0  . diltiazem (CARDIZEM LA) 240 MG 24 hr tablet Take 1 tablet (240 mg total) by mouth daily. 30 tablet 0  . nebivolol (BYSTOLIC) 2.5 MG tablet Take 1 tablet (2.5 mg total) by mouth daily. Take along with 527m (7.521mtotal) daily (Patient not taking: Reported on 08/11/2016) 30 tablet 11  . nebivolol (BYSTOLIC) 5 MG tablet Take 1 tablet (5 mg total) by mouth daily. Take along with 2.27m32m7.27mg827mtal) daily (Patient not taking: Reported on 08/11/2016) 30 tablet 11  . rosuvastatin (CRESTOR) 20 MG tablet Take 1 tablet (20 mg total) by mouth daily. (Patient not taking: Reported on 08/14/2016) 30 tablet 6   No facility-administered medications prior to visit.      Allergies:   Lidocaine hcl; Codeine; Crestor [rosuvastatin calcium]; and Percocet [oxycodone-acetaminophen]   Social History   Social History  . Marital status: Married    Spouse name: N/A  . Number of children: N/A  . Years of education: N/A   Social History Main Topics  . Smoking status: Former Smoker    Packs/day: 0.02    Years: 1.00    Types: Cigarettes    Quit date: 07/10/1971  . Smokeless tobacco: Never Used     Comment: Patient smoked 1/2 pack per week  . Alcohol use 8.4 oz/week    14 Glasses of wine per week     Comment: 1-2 glasses of wine with evening meal  . Drug use: No  . Sexual activity: Yes    Birth control/ protection: Other-see comments     Comment: old age   Other Topics Concern  . None   Social History Narrative  . None     Family History:   The patient's family history includes Asthma in his father; Heart disease in his father; Melanoma in his brother; Rheum arthritis in his mother; Thyroid cancer in his mother and sister.   Review of Systems:   Please see the history of present illness.     General:  No chills, fever, night sweats or weight changes.  Cardiovascular:  No dyspnea on exertion, edema, orthopnea, paroxysmal nocturnal dyspnea. Positive for chest pain and palpitations.  Dermatological: No rash, lesions/masses Respiratory: No cough, dyspnea Urologic: No hematuria, dysuria Abdominal:   No nausea, vomiting, diarrhea, bright red blood per rectum,  melena, or hematemesis Neurologic:  No visual changes, wkns, changes in mental status. All other systems reviewed and are otherwise negative except as noted above.   Physical Exam:    VS:  BP 130/80   Pulse 80   Ht '5\' 9"'  (1.753 m)   Wt 189 lb 9.6 oz (86 kg)   BMI 28.00 kg/m    General: Well developed, well nourished Caucasian male appearing in no acute distress. Head: Normocephalic, atraumatic, sclera non-icteric, no xanthomas, nares are without discharge.  Neck: No carotid bruits. JVD not elevated.  Lungs: Respirations regular and unlabored, without wheezes or rales.  Heart: Regular rate and rhythm. No S3 or S4.  No murmur, no rubs, or gallops appreciated. Abdomen: Soft, non-tender, non-distended with normoactive bowel sounds. No hepatomegaly. No rebound/guarding. No obvious abdominal masses. Msk:  Strength and tone appear normal for age. No joint deformities or effusions. Extremities: No clubbing or cyanosis. No lower extremity edema.  Distal pedal pulses are 2+ bilaterally. Neuro: Alert and oriented X 3. Moves all extremities spontaneously. No focal deficits noted. Psych:  Responds to questions appropriately with a normal affect. Skin: No rashes or lesions noted  Wt Readings from Last 3 Encounters:  08/14/16 189 lb 9.6 oz (86 kg)  08/11/16 190 lb (86.2 kg)    07/19/16 195 lb 6.4 oz (88.6 kg)     Studies/Labs Reviewed:   EKG:  EKG is ordered today.  The ekg ordered today demonstrates NSR, HR 80, with occasional PVC's. No acute ST or T-wave changes when compared to prior tracings.   Recent Labs: 04/02/2016: Brain Natriuretic Peptide 53.0 07/16/2016: ALT 13; TSH 2.31 08/11/2016: BUN 19; Creatinine, Ser 1.03; Hemoglobin 18.4; Platelets 182; Potassium 4.3; Sodium 136   Lipid Panel    Component Value Date/Time   CHOL 222 (H) 07/16/2016 0902   TRIG 359 (H) 07/16/2016 0902   HDL 67 07/16/2016 0902   CHOLHDL 3.3 07/16/2016 0902   VLDL 72 (H) 07/16/2016 0902   LDLCALC 83 07/16/2016 0902    Additional studies/ records that were reviewed today include:   Echocardiogram: 03/22/2016 Study Conclusions  - Left ventricle: The cavity size was normal. Wall thickness was   normal. Systolic function was normal. The estimated ejection   fraction was in the range of 60% to 65%. Wall motion was normal;   there were no regional wall motion abnormalities. Doppler   parameters are consistent with abnormal left ventricular   relaxation (grade 1 diastolic dysfunction). - Aortic valve: There was no stenosis. - Aorta: Mildly dilated aortic root. Aortic root dimension: 38 mm   (ED). - Mitral valve: There was no significant regurgitation. - Right ventricle: The cavity size was normal. Systolic function   was normal. - Pulmonary arteries: No complete TR doppler jet so unable to   estimate PA systolic pressure. - Inferior vena cava: The vessel was normal in size. The   respirophasic diameter changes were in the normal range (>= 50%),   consistent with normal central venous pressure.  Impressions:  - Normal LV size with EF 60-65%. Normal RV size and systolic   function. No significant valvular abnormalities.  NST: 03/21/2016  The left ventricular ejection fraction is normal (55-65%).  Nuclear stress EF: 61%.  The study is normal.  This is a low  risk study.  There was no ST segment deviation noted during stress.   Assessment:    1. PAF (paroxysmal atrial fibrillation) ([place])   2. Chronic anticoagulation   3. Essential hypertension  4. Mixed hyperlipidemia      Plan:   In order of problems listed above:  1. Paroxysmal Atrial Fibrillation/ Chronic Anticoagulation - has a history of PAF, initially diagnosed in 02/2016. Seen in the ER on 08/11/2016 for evaluation of chest pain with associated dyspnea and diaphoresis and found to be in atrial fibrillation with RVR, HR in the 160's. Labs were without acute findings. Started on Cardizem CD 243m daily (Bisoprolol discontinued) along with being restarted on Eliquis 57mBID for anticoagulation.  - he denies any recurrent episodes since. EKG today shows he has converted to NSR. Recent NST in 03/2016 showed no evidence of ischemia.  - in discussing possible triggers of his recurrent rhythm, he reports consuming 2-3 glasses of wine per night. We discussed limiting this to one drink per night with eventual cessation.  - This patients CHA2DS2-VASc Score and unadjusted Ischemic Stroke Rate (% per year) is equal to 2.2 % stroke rate/year from a score of 2 (HTN, Age). He denies any evidence of active bleeding. Continue Eliquis for anticoagulation. Stop ASA.  - continue Cardizem CD 24046maily for rate-control.   2. HTN - BP well-controlled at 130/80 during today's visit.  - continue Cardizem.  3. HLD - Lipid Panel from 07/2016 shows total cholesterol 222, triglycerides 359, and LDL 83.  - prescribed Crestor 67m77m that time but has yet to start this due to a history of myalgias with Crestor in the past (unsure of dosing at that time). He is willing to try a lower dose and titrate if able. Will provide with Rx for Crestor 5mg 40mly and continue with Zetia and Fish Oil.     Medication Adjustments/Labs and Tests Ordered: Current medicines are reviewed at length with the patient today.   Concerns regarding medicines are outlined above.  Medication changes, Labs and Tests ordered today are listed in the Patient Instructions below. Patient Instructions   Medication Instructions: STOP Aspirin   Decrease Crestor to 5 mg daily.  Follow-Up: Your physician wants you to follow-up in: 6 months with Dr. KellyClaiborne Billings will receive a reminder letter in the mail two months in advance. If you don't receive a letter, please call our office to schedule the follow-up appointment.  If you need a refill on any cardiac medications before your next appointment, please call your pharmacy.    SigneArna Medici 4U[place]/2018 12:46 PM    [facility] [address] [id number]: [phone]: [phone] [id number]: [phone]

## 2016-08-14 ENCOUNTER — Encounter: Payer: Self-pay | Admitting: Student

## 2016-08-14 ENCOUNTER — Ambulatory Visit (INDEPENDENT_AMBULATORY_CARE_PROVIDER_SITE_OTHER): Payer: PPO | Admitting: Student

## 2016-08-14 VITALS — BP 130/80 | HR 80 | Ht 69.0 in | Wt 189.6 lb

## 2016-08-14 DIAGNOSIS — I10 Essential (primary) hypertension: Secondary | ICD-10-CM | POA: Diagnosis not present

## 2016-08-14 DIAGNOSIS — I480 Paroxysmal atrial fibrillation: Secondary | ICD-10-CM

## 2016-08-14 DIAGNOSIS — E782 Mixed hyperlipidemia: Secondary | ICD-10-CM

## 2016-08-14 DIAGNOSIS — Z7901 Long term (current) use of anticoagulants: Secondary | ICD-10-CM | POA: Diagnosis not present

## 2016-08-14 MED ORDER — APIXABAN 5 MG PO TABS: 5.0000 mg | ORAL_TABLET | Freq: Two times a day (BID) | ORAL | 0 refills | Status: DC

## 2016-08-14 MED ORDER — APIXABAN 5 MG PO TABS: 5.0000 mg | ORAL_TABLET | Freq: Two times a day (BID) | ORAL | 1 refills | Status: DC

## 2016-08-14 MED ORDER — DILTIAZEM HCL ER COATED BEADS 240 MG PO TB24: 240.0000 mg | ORAL_TABLET | Freq: Every day | ORAL | 1 refills | Status: DC

## 2016-08-14 MED ORDER — ROSUVASTATIN CALCIUM 5 MG PO TABS: 5.0000 mg | ORAL_TABLET | Freq: Every day | ORAL | 1 refills | Status: DC

## 2016-08-14 MED ORDER — ROSUVASTATIN CALCIUM 5 MG PO TABS: 5.0000 mg | ORAL_TABLET | Freq: Every day | ORAL | 2 refills | Status: DC

## 2016-08-14 MED ORDER — DILTIAZEM HCL ER COATED BEADS 240 MG PO TB24: 240.0000 mg | ORAL_TABLET | Freq: Every day | ORAL | 0 refills | Status: DC

## 2016-08-14 NOTE — Patient Instructions (Signed)
Medication Instructions: STOP Aspirin   Decrease Crestor to 5 mg daily.   Follow-Up: Your physician wants you to follow-up in: 6 months with Dr. Claiborne Billings. You will receive a reminder letter in the mail two months in advance. If you don't receive a letter, please call our office to schedule the follow-up appointment.  If you need a refill on any cardiac medications before your next appointment, please call your pharmacy.

## 2016-08-20 ENCOUNTER — Telehealth: Payer: Self-pay | Admitting: Cardiovascular Disease

## 2016-08-20 NOTE — Telephone Encounter (Signed)
New message    Pt is calling because his pulse rate is 127 after doing yard work today. He said his medication was just recently changed. No other symptoms.

## 2016-08-20 NOTE — Telephone Encounter (Signed)
Spoke with patient and he stated that when he was outside doing a few things his heart rate went up to 127. Patient stated he did not feel that he was in Afib After resting for a few minutes HR did come down to 113. He stated that is HR had been going up like this since he was in the ED 08/11/16. No way of checking blood pressure until his wife gets home.  Discussed with Estrella Myrtle PA and will have patient start Bystolic 5 mg daily if systolic blood pressure above 130 and HR above 55.  Monitor blood pressure and HR and call back with update. Advised patient, verbalized understanding.

## 2016-08-27 DIAGNOSIS — R21 Rash and other nonspecific skin eruption: Secondary | ICD-10-CM | POA: Diagnosis not present

## 2016-08-30 ENCOUNTER — Telehealth: Payer: Self-pay | Admitting: *Deleted

## 2016-08-30 DIAGNOSIS — L42 Pityriasis rosea: Secondary | ICD-10-CM | POA: Diagnosis not present

## 2016-08-30 MED ORDER — FISH OIL 1000 MG PO CAPS: ORAL_CAPSULE | ORAL | Status: DC

## 2016-08-30 NOTE — Telephone Encounter (Signed)
-----  Message from Troy Sine, MD sent at 07/22/2016 11:53 AM EDT ----- ESR is now normal.  C-reactive protein is markedly improved.  Lipids are significantly elevated.  LFTs are normal.  Start Crestor 20 mg and 2 capsules of omega-3 fatty acids daily.  Repeat lipid panel in 2-3 months with LFTs

## 2016-08-30 NOTE — Telephone Encounter (Signed)
Message was sent to Dr Claiborne Billings weeks ago informing him patient listed allergy to rosuvastatin. Asked if he wants to change to a different medication. He responded to me today saying that he wants the patient to "revisit" the medication if possible since his "allergy" is muscle discomfort. Spoke with patient today to give lab results and recommendations and he informs me that he was seen by  "the PA" and was started back on the rosuvastatin at 5 mg daily. I told him this is fine, but Dr Claiborne Billings also wants him to increase the omega-3 to 2 capsules daily. Get labs repeated in 2-3 months. Patient agrees with plan.

## 2016-09-20 ENCOUNTER — Ambulatory Visit: Payer: PPO

## 2016-09-20 ENCOUNTER — Ambulatory Visit: Payer: PPO | Admitting: Adult Health

## 2016-09-25 ENCOUNTER — Emergency Department (HOSPITAL_COMMUNITY)
Admission: EM | Admit: 2016-09-25 | Discharge: 2016-09-25 | Disposition: A | Discharge status: Home / Self Care | Payer: PPO | Attending: Physician Assistant | Admitting: Physician Assistant

## 2016-09-25 ENCOUNTER — Emergency Department (HOSPITAL_COMMUNITY): Payer: PPO

## 2016-09-25 ENCOUNTER — Encounter (HOSPITAL_COMMUNITY): Payer: Self-pay | Admitting: Nurse Practitioner

## 2016-09-25 DIAGNOSIS — I10 Essential (primary) hypertension: Secondary | ICD-10-CM | POA: Diagnosis not present

## 2016-09-25 DIAGNOSIS — Z7901 Long term (current) use of anticoagulants: Secondary | ICD-10-CM | POA: Diagnosis not present

## 2016-09-25 DIAGNOSIS — H539 Unspecified visual disturbance: Secondary | ICD-10-CM | POA: Diagnosis not present

## 2016-09-25 DIAGNOSIS — R410 Disorientation, unspecified: Secondary | ICD-10-CM | POA: Diagnosis not present

## 2016-09-25 DIAGNOSIS — H538 Other visual disturbances: Secondary | ICD-10-CM

## 2016-09-25 DIAGNOSIS — Z87891 Personal history of nicotine dependence: Secondary | ICD-10-CM | POA: Insufficient documentation

## 2016-09-25 DIAGNOSIS — Z79899 Other long term (current) drug therapy: Secondary | ICD-10-CM | POA: Insufficient documentation

## 2016-09-25 DIAGNOSIS — Z7982 Long term (current) use of aspirin: Secondary | ICD-10-CM | POA: Diagnosis not present

## 2016-09-25 LAB — DIFFERENTIAL
Basophils Absolute: 0 10*3/uL (ref 0.0–0.1)
Basophils Relative: 0 %
Eosinophils Absolute: 0 10*3/uL (ref 0.0–0.7)
Eosinophils Relative: 0 %
Lymphocytes Relative: 37 %
Lymphs Abs: 2.8 10*3/uL (ref 0.7–4.0)
Monocytes Absolute: 0.6 10*3/uL (ref 0.1–1.0)
Monocytes Relative: 9 %
Neutro Abs: 4.1 10*3/uL (ref 1.7–7.7)
Neutrophils Relative %: 54 %

## 2016-09-25 LAB — CBG MONITORING, ED: Glucose-Capillary: 88 mg/dL (ref 65–99)

## 2016-09-25 LAB — COMPREHENSIVE METABOLIC PANEL
ALT: 20 U/L (ref 17–63)
AST: 26 U/L (ref 15–41)
Albumin: 3.7 g/dL (ref 3.5–5.0)
Alkaline Phosphatase: 72 U/L (ref 38–126)
Anion gap: 8 (ref 5–15)
BUN: 5 mg/dL — ABNORMAL LOW (ref 6–20)
CO2: 26 mmol/L (ref 22–32)
Calcium: 9.2 mg/dL (ref 8.9–10.3)
Chloride: 103 mmol/L (ref 101–111)
Creatinine, Ser: 1.06 mg/dL (ref 0.61–1.24)
GFR calc Af Amer: >60 mL/min (ref >60)
GFR calc non Af Amer: >60 mL/min (ref >60)
Glucose, Bld: 93 mg/dL (ref 65–99)
Potassium: 3.6 mmol/L (ref 3.5–5.1)
Sodium: 137 mmol/L (ref 135–145)
Total Bilirubin: 1.3 mg/dL — ABNORMAL HIGH (ref 0.3–1.2)
Total Protein: 6.2 g/dL — ABNORMAL LOW (ref 6.5–8.1)

## 2016-09-25 LAB — CBC
HCT: 47.8 % (ref 39.0–52.0)
Hemoglobin: 16.7 g/dL (ref 13.0–17.0)
MCH: 36.6 pg — ABNORMAL HIGH (ref 26.0–34.0)
MCHC: 34.9 g/dL (ref 30.0–36.0)
MCV: 104.8 fL — ABNORMAL HIGH (ref 78.0–100.0)
Platelets: 195 10*3/uL (ref 150–400)
RBC: 4.56 MIL/uL (ref 4.22–5.81)
RDW: 15.1 % (ref 11.5–15.5)
WBC: 7.5 10*3/uL (ref 4.0–10.5)

## 2016-09-25 LAB — I-STAT CHEM 8, ED
BUN: 5 mg/dL — ABNORMAL LOW (ref 6–20)
Calcium, Ion: 1.13 mmol/L — ABNORMAL LOW (ref 1.15–1.40)
Chloride: 100 mmol/L — ABNORMAL LOW (ref 101–111)
Creatinine, Ser: 1.1 mg/dL (ref 0.61–1.24)
Glucose, Bld: 95 mg/dL (ref 65–99)
HCT: 49 % (ref 39.0–52.0)
Hemoglobin: 16.7 g/dL (ref 13.0–17.0)
Potassium: 3.7 mmol/L (ref 3.5–5.1)
Sodium: 139 mmol/L (ref 135–145)
TCO2: 29 mmol/L (ref 0–100)

## 2016-09-25 LAB — APTT: aPTT: 30 seconds (ref 24–36)

## 2016-09-25 LAB — PROTIME-INR
INR: 1.01
Prothrombin Time: 13.3 seconds (ref 11.4–15.2)

## 2016-09-25 LAB — I-STAT TROPONIN, ED: Troponin i, poc: 0 ng/mL (ref 0.00–0.08)

## 2016-09-25 NOTE — ED Provider Notes (Signed)
[facility] DEPT Provider Note   CSN: [id number] Arrival date & time: 09/25/16  1600     History   Chief Complaint Chief Complaint  Patient presents with  . Stroke Symptoms    HPI [name] is a 71 y.o. male.  The history is provided by the patient.  Neurologic Problem  This is a new problem. The current episode started 6 to 12 hours ago. The problem has been resolved. Pertinent negatives include no chest pain, no abdominal pain, no headaches and no shortness of breath. Associated symptoms comments: Right side eye blurriness, no pain, mild pressure, all resolved now, no headaches. Started at 2 pm while he was driving. No other symptoms. No hx of stroke.. Nothing aggravates the symptoms. Nothing relieves the symptoms. He has tried nothing for the symptoms. The treatment provided no relief.    Past Medical History:  Diagnosis Date  . Arthritis   . Chest pain    a. 03/2017: echo showing EF of 60-65%, no regional WMA or significant valve abnormalities. b. 03/2016: NST with no evidence of ischemia.   . Digestive problems    on Prednisone prn for this issue- diarrhea  . Dysrhythmia    irregular due to Myocarditis- takes Atenolol, Norvasc  . GERD (gastroesophageal reflux disease)   . H/O viral myocarditis    25 years ago  . Head injury, closed, with concussion    Breif LOC  . Hyperlipidemia   . Mild mitral valve prolapse    per Dr Evette Georges notes  . PAF (paroxysmal atrial fibrillation) ([place])    a. initially occuring in 02/2016. b. recurrent in 07/2016. Placed on Eliquis  . Polymyalgia rheumatica [facility])     Patient Active Problem List   Diagnosis Date Noted  . PAF (paroxysmal atrial fibrillation) ([place]) 08/14/2016  . ILD (interstitial lung disease) ([place]) 05/01/2016  . Lumbar stenosis 02/01/2015  . Giardia 04/14/2014  . H/O viral cardiomyopathy 04/14/2014  . Herniated nucleus pulposus, L3-4 right 05/05/2013  . Other hypertrophic cardiomyopathy ([place]) 04/19/2013  .  Hyperlipidemia 04/19/2013  . Essential hypertension 04/19/2013  . Abnormal LFTs 09/16/2012  . Nausea 07/10/2011  . Chronic diarrhea 07/10/2011  . Burping 07/10/2011  . Osteoporosis 07/10/2011  . Arthritis 07/10/2011    Past Surgical History:  Procedure Laterality Date  . apendectomy  1965  . APPENDECTOMY    . bone spur Bilateral 1999   feet  . CARDIAC CATHETERIZATION  2001  . CHOLECYSTECTOMY  2004  . COLONOSCOPY    . LUMBAR LAMINECTOMY/ DECOMPRESSION WITH MET-RX Right 05/05/2013   Procedure: Right Lumbar three-four Extraforaminal Microdiskectomy with Metrex;  Surgeon: [name], MD;  [facility];  Service: Neurosurgery;  Laterality: Right;  Right Lumbar three-four Extraforaminal Microdiskectomy with Metrex  . SHOULDER OPEN ROTATOR CUFF REPAIR Bilateral 2001  . TONSILLECTOMY         Home Medications    Prior to Admission medications   Medication Sig Start Date End Date Taking? Authorizing Provider  acetaminophen (TYLENOL) 500 MG tablet Take 500 mg by mouth every 6 (six) hours as needed for mild pain.    Yes [provider]  apixaban (ELIQUIS) 5 MG TABS tablet Take 1 tablet (5 mg total) by mouth 2 (two) times daily. 08/14/16  Yes Strader, [place] M, PA-C  cetirizine (ZYRTEC) 10 MG tablet Take 10 mg by mouth daily as needed for allergies.    Yes [provider]  diltiazem (CARDIZEM LA) 240 MG 24 hr tablet Take 1 tablet (240  mg total) by mouth daily. 08/14/16  Yes Strader, [place] M, PA-C  Esomeprazole Magnesium (NEXIUM 24HR) 20 MG TBEC Take 20 mg by mouth daily as needed (for heartburn).   Yes [provider]  Multiple Vitamins-Minerals (CENTRUM SILVER 50+MEN PO) Take 1 tablet by mouth daily.   Yes [provider]  Omega-3 Fatty Acids (FISH OIL) 1000 MG CAPS take 2 capsules daily Patient taking differently: Take 1,000 mg by mouth 2 (two) times daily.  08/30/16  Yes Troy Sine, MD  ondansetron (ZOFRAN) 4 MG tablet Take 4 mg by  mouth every 8 (eight) hours as needed for nausea or vomiting.   Yes [provider]  tamsulosin (FLOMAX) 0.4 MG CAPS capsule Take 0.4 mg by mouth at bedtime.    Yes [provider]  TURMERIC PO Take 1 capsule by mouth daily.   Yes [provider]  aspirin EC 81 MG tablet Take 1 tablet (81 mg total) by mouth daily. Patient not taking: Reported on 09/25/2016 06/07/16   Troy Sine, MD  ezetimibe (ZETIA) 10 MG tablet TAKE ONE (1) TABLET EACH DAY Patient not taking: Reported on 09/25/2016 06/04/16   Troy Sine, MD  rosuvastatin (CRESTOR) 5 MG tablet Take 1 tablet (5 mg total) by mouth daily. Patient not taking: Reported on 09/25/2016 08/14/16 02/10/17  Erma Heritage, PA-C    Family History Family History  Problem Relation Age of Onset  . Rheum arthritis Mother   . Thyroid cancer Mother   . Heart disease Father   . Asthma Father   . Thyroid cancer Sister   . Melanoma Brother     Social History Social History  Substance Use Topics  . Smoking status: Former Smoker    Packs/day: 0.02    Years: 1.00    Types: Cigarettes    Quit date: 07/10/1971  . Smokeless tobacco: Never Used     Comment: Patient smoked 1/2 pack per week  . Alcohol use 8.4 oz/week    14 Glasses of wine per week     Comment: 1-2 glasses of wine with evening meal     Allergies   Lidocaine hcl; Xylocaine [lidocaine]; Codeine; Crestor [rosuvastatin calcium]; Percocet [oxycodone-acetaminophen]; and Statins   Review of Systems Review of Systems  Constitutional: Negative for chills and fever.  HENT: Negative for ear pain and sore throat.   Eyes: Positive for visual disturbance. Negative for pain.  Respiratory: Negative for cough and shortness of breath.   Cardiovascular: Negative for chest pain and palpitations.  Gastrointestinal: Negative for abdominal pain and vomiting.  Genitourinary: Negative for dysuria and hematuria.  Musculoskeletal: Negative for arthralgias and back pain.    Skin: Negative for color change and rash.  Neurological: Negative for dizziness, tremors, seizures, syncope, facial asymmetry, speech difficulty, weakness, light-headedness, numbness and headaches.  All other systems reviewed and are negative.    Physical Exam Updated Vital Signs ED Triage Vitals [09/25/16 1608]  Enc Vitals Group     BP 121/79     Pulse Rate 79     Resp 18     Temp 98.4 F (36.9 C)     Temp Source Oral     SpO2 95 %     Weight      Height      Head Circumference      Peak Flow      Pain Score 0     Pain Loc      Pain Edu?  Excl. in [place]?     Physical Exam  Constitutional: He is oriented to person, place, and time. He appears well-developed and well-nourished.  HENT:  Head: Normocephalic and atraumatic.  Eyes: Conjunctivae and EOM are normal. Pupils are equal, round, and reactive to light.  20/20 vision in each eye  Neck: Normal range of motion. Neck supple.  Cardiovascular: Normal rate, regular rhythm, normal heart sounds and intact distal pulses.   Pulmonary/Chest: Effort normal and breath sounds normal. No respiratory distress.  Abdominal: Soft. There is no tenderness.  Musculoskeletal: Normal range of motion. He exhibits no edema.  Neurological: He is alert and oriented to person, place, and time. No cranial nerve deficit or sensory deficit. He exhibits normal muscle tone. Coordination normal.  5+/5 strength, normal sensation, no drift, normal finger to nose finger  Skin: Skin is warm and dry.  Psychiatric: He has a normal mood and affect.  Nursing note and vitals reviewed.    ED Treatments / Results  Labs (all labs ordered are listed, but only abnormal results are displayed) Labs Reviewed  CBC - Abnormal; Notable for the following:       Result Value   MCV 104.8 (*)    MCH 36.6 (*)    All other components within normal limits  COMPREHENSIVE METABOLIC PANEL - Abnormal; Notable for the following:    BUN 5 (*)    Total Protein 6.2 (*)     Total Bilirubin 1.3 (*)    All other components within normal limits  I-STAT CHEM 8, ED - Abnormal; Notable for the following:    Chloride 100 (*)    BUN 5 (*)    Calcium, Ion 1.13 (*)    All other components within normal limits  PROTIME-INR  APTT  DIFFERENTIAL  I-STAT TROPOININ, ED  CBG MONITORING, ED    EKG  EKG Interpretation None       Radiology Ct Head Wo Contrast  Result Date: 09/25/2016 CLINICAL DATA:  71 year old male with history of confusion and blurred vision in the right side. EXAM: CT HEAD WITHOUT CONTRAST TECHNIQUE: Contiguous axial images were obtained from the base of the skull through the vertex without intravenous contrast. COMPARISON:  No priors. FINDINGS: Brain: No evidence of acute infarction, hemorrhage, hydrocephalus, extra-axial collection or mass lesion/mass effect. Vascular: Small hyperdense area in the posterior aspect of the left frontal lobe best demonstrated on axial image 22 of series 2 and coronal image 33 of series 4, likely to represent a small developmental venous anomaly. Skull: Normal. Negative for fracture or focal lesion. Sinuses/Orbits: No acute finding. Other: None. IMPRESSION: 1. No acute intracranial abnormalities. 2. Probable small left frontal developmental venous anomaly incidentally noted. Electronically Signed   By: Vinnie Langton M.D.   On: 09/25/2016 17:09    Procedures Procedures (including critical care time)  Medications Ordered in ED Medications - No data to display   Initial Impression / Assessment and Plan / ED Course  I have reviewed the triage vital signs and the nursing notes.  Pertinent labs & imaging results that were available during my care of the patient were reviewed by me and considered in my medical decision making (see chart for details).     [name] is a 71 year old male with history of high cholesterol, alcohol abuse, diabetes, atrial fibrillation on eliquis, reflux who presents to the ED with  right blurry vision. Patient's vitals at time of arrival to the ED are unremarkable and patient is without fever. Patient with  patient with blurry vision in the right eye that began around 2 PM, about 6 hours ago. Symptoms have now resolved and patient has no blurry vision or neurological complaints at this time. Patient states that he was driving and was unable to focus out of his right eye. He denies weakness in his arms or legs, slurred speech, facial droop, inability to walk. Patient did not have headache at that time. He denies shortness of breath, chest pain, belly pain. Patient is asymptomatic upon my evaluation. Patient was evaluated upon arrival to the ED and symptoms were not consistent with a stroke and code stroke was called. Patient on exam is overall well-appearing and has normal neurological exam. Patient with CBC, coags, troponin, EKG, blood glucose, CMP, head CT ordered prior to my evaluation. EKG in triage showed normal sinus rhythm with no signs of ischemia.  Patient with no signs of acute stroke on head CT. Head CT with no acute findings. Patient with undetectable troponin and doubt ACS. Patient with normal blood glucose. Patient with no anemia. Patient with no leukocytosis and no fever and doubt infectious process. Patient with overall unremarkable labs. Patient with 20/20 vision on exam. Patient has no eye pain and no high pressure and no signs of trauma. No signs to suggest glaucoma, corneal abrasion. Patient with normal extraocular movements as well. No signs of infection around. Patient does not describe amaurosis fugax. Patient did not have headache and doubt likely complex migraine. Possible TIA symptoms and offered patient an MRI but he would like to follow-up with primary care provider. Patient has capacity to make this decision and risks and benefits were discussed and after discussion using shared decision making, patient would like to follow up with primary care provider. Patient  was told to return to the ED if symptoms worsen or if he changed his mind. Recommend that patient get outpatient MRI and carotid ultrasounds. Patient discharged from the ED in good condition and was asymptomatic at the time of discharge.  Final Clinical Impressions(s) / ED Diagnoses   Final diagnoses:  Visual disturbances  Visual blurriness    New Prescriptions Discharge Medication List as of 09/25/2016 10:38 PM       Lennice Sites, DO 09/25/16 2323    Thomasene Lot, Fredia Sorrow, MD 09/26/16 2000

## 2016-09-25 NOTE — ED Notes (Signed)
Dr Thomasene Lot to triage to screen patient, stroke protocol ordered, no code stroke called

## 2016-09-25 NOTE — ED Notes (Signed)
ED Provider at bedside. 

## 2016-09-25 NOTE — Discharge Instructions (Signed)
Please return to the ED if symptoms worsen.

## 2016-09-25 NOTE — ED Triage Notes (Addendum)
Pt presents with c/o stroke symptoms. The symptoms began today at around 2 pm, while he was sitting and eating lunch. He suddenly felt diaphoretic, off balance, unable to focus, and he experienced blurred vision and pressure behind his R eye. He began to drive home and some of  the symptoms resolved but he continues to not feel well. He says he is unable to exactly  describe his feelings with words but he "just feels like things have shifted off their track". He reports weakness. He denies any speech difficulty. He tried nasal spray, cetirizine, rest with no improvement.

## 2016-09-26 ENCOUNTER — Telehealth: Payer: Self-pay | Admitting: Cardiovascular Disease

## 2016-09-26 MED ORDER — DABIGATRAN ETEXILATE MESYLATE 150 MG PO CAPS: 150.0000 mg | ORAL_CAPSULE | Freq: Two times a day (BID) | ORAL | 11 refills | Status: DC

## 2016-09-26 NOTE — Telephone Encounter (Signed)
Can cause some nausea, have him eat some food with the dose.

## 2016-09-26 NOTE — Telephone Encounter (Signed)
Routed to pharmacy for assistance - common side effect?

## 2016-09-26 NOTE — Telephone Encounter (Signed)
New message      Pt c/o medication issue:  1. Name of Medication:  eliquis 2. How are you currently taking this medication (dosage and times per day)? 5mg  3. Are you having a reaction (difficulty breathing--STAT)?  no 4. What is your medication issue? Morning dosage is making pt nausea.  No problem with pm dosage. Please advise

## 2016-09-26 NOTE — Telephone Encounter (Signed)
Patient complaining of nausea with Eliquis. Unable to use Xarelto due to severe nausea as well.   Option for warfarin therapy presented to [name] but he will prefer to avoid warfarin for now.    Pradaxa may be another option for anticoagulation on A-Fib but may cause gastric irritation. Option and potential ADR discussed with patient. He want to try Pradaxa for now.

## 2016-09-26 NOTE — Telephone Encounter (Signed)
Patient states he eats food with his morning dose. He takes his evening dose right before he goes to bed so if he has nausea he wouldn't know/doesn't wake him up. He has been on eliquis since 4/24.   Will send back to pharm? Change NOAC d/t side effect?

## 2016-09-27 ENCOUNTER — Other Ambulatory Visit: Payer: Self-pay | Admitting: Internal Medicine

## 2016-09-27 ENCOUNTER — Ambulatory Visit (HOSPITAL_COMMUNITY)
Admission: RE | Admit: 2016-09-27 | Discharge: 2016-09-27 | Disposition: A | Discharge status: Home / Self Care | Payer: PPO | Source: Ambulatory Visit | Attending: Internal Medicine | Admitting: Internal Medicine

## 2016-09-27 DIAGNOSIS — R4182 Altered mental status, unspecified: Secondary | ICD-10-CM | POA: Diagnosis not present

## 2016-09-27 DIAGNOSIS — G3184 Mild cognitive impairment, so stated: Secondary | ICD-10-CM | POA: Diagnosis not present

## 2016-09-27 DIAGNOSIS — G8101 Flaccid hemiplegia affecting right dominant side: Secondary | ICD-10-CM

## 2016-09-27 DIAGNOSIS — R410 Disorientation, unspecified: Secondary | ICD-10-CM

## 2016-10-04 ENCOUNTER — Telehealth: Payer: Self-pay | Admitting: Cardiovascular Disease

## 2016-10-04 NOTE — Telephone Encounter (Signed)
Spoke with [name] states that for the last 2 weeks he has been having body aches and he states that he thinks that it is from [place] or Eliquis, he states that he does not take Crestor or Zetia. Please advise what he should do now

## 2016-10-04 NOTE — Telephone Encounter (Signed)
New message     Pt c/o medication issue:  1. Name of Medication:  Doesn't know he is on several - you have changed several of his medications   2. How are you currently taking this medication (dosage and times per day)?   3. Are you having a reaction (difficulty breathing--STAT)?  Body is aching, he is having trouble with walking   4. What is your medication issue?  Medication is giving him body aches and he doesn't know which one

## 2016-10-04 NOTE — Telephone Encounter (Signed)
Patient never changed the Eliquis as discussed last week.   Patient will change to Pradaxa 150mg  twice daily and re-assess tolerability.  Will continue Diltiazem for now

## 2016-10-10 ENCOUNTER — Ambulatory Visit (INDEPENDENT_AMBULATORY_CARE_PROVIDER_SITE_OTHER): Payer: PPO | Admitting: Neurology

## 2016-10-10 ENCOUNTER — Encounter: Payer: Self-pay | Admitting: Neurology

## 2016-10-10 ENCOUNTER — Other Ambulatory Visit (INDEPENDENT_AMBULATORY_CARE_PROVIDER_SITE_OTHER): Payer: PPO

## 2016-10-10 VITALS — BP 140/88 | HR 94 | Ht 69.0 in | Wt 190.1 lb

## 2016-10-10 DIAGNOSIS — G4730 Sleep apnea, unspecified: Secondary | ICD-10-CM

## 2016-10-10 DIAGNOSIS — R251 Tremor, unspecified: Secondary | ICD-10-CM | POA: Diagnosis not present

## 2016-10-10 DIAGNOSIS — R441 Visual hallucinations: Secondary | ICD-10-CM | POA: Diagnosis not present

## 2016-10-10 DIAGNOSIS — R413 Other amnesia: Secondary | ICD-10-CM

## 2016-10-10 DIAGNOSIS — R4789 Other speech disturbances: Secondary | ICD-10-CM

## 2016-10-10 LAB — FOLATE: Folate: >24 ng/mL (ref >5.9)

## 2016-10-10 LAB — VITAMIN B12: Vitamin B-12: 742 pg/mL (ref 211–911)

## 2016-10-10 NOTE — Patient Instructions (Addendum)
1.  We will check blood work for HIV, RPR, B12, methylmalonic acid level, folate, heavy metals and Lyme 2.  We will check MRI of brain with contrast 3.  I will refer you for formal neurocognitive testing (performed here in our office) 4.  Further recommendations pending results. 5.  We will also refer you to sleep specialist to evaluate for sleep apnea.

## 2016-10-10 NOTE — Progress Notes (Addendum)
NEUROLOGY CONSULTATION NOTE  BENOIT MEECH MRN: [id number] DOB: [birth year]  Referring provider: Dr. Nevada Crane Primary care provider: Dr. Nevada Crane  Reason for consult:  Possible TIA  HISTORY OF PRESENT ILLNESS: [name] is a 71 year old right-handed [place] male with paroxysmal atrial fibrillation, hypertension, interstitial lung disease and history of viral myocarditis who presents for possible TIA.  History supplemented by ED note.  On 09/25/16, he was eating lunch when he had sudden onset of not feeling well.  He described it as a "wave rushing to the right" like "dizziness without the dizziness."  He could not elaborate further, but it was not spinning sensation or lightheadedness/sensation of going to pass out.  He also reported blurred vision in the right eye, which may have been there well before these symptoms.  There was no associated darkening of vision, diplopia, eye pain, headache, facial droop, unilateral numbness or weakness.  It lasted about 8 to 9 hours.  It occurred spontaneously.  There was nothing to trigger or relieve the symptom.  He presented to the ED for further evaluation.  CT of head was personally reviewed and revealed no acute findings.  MRI was recommended, but he declined, opting to follow up with his PCP instead.  Two days later, he had a recurrence of similar symptoms but this time, symptoms have not completely resolved, however they fluctuate in intensity.  He reports hypersensitivity to his surroundings and difficulty processing stimulation.  When there is a lot of stimulation, he becomes uncomfortable and scared.  For example, he cannot tolerate riding in a car that is going at 45 mph with cars moving and traveling back and forth around him.  He also cannot tolerate loud noise or voices.  He also reports problems with memory.  He has word-finding difficulty.  He once placed his toothbrush upside down in the cup.  He has trouble using the TV remote.  He also  reports two instances of visual hallucinations.  He once saw bugs and another time, it looked like that the toilet seat was vibrating.  He has excessive daytime somnolence.  He also has decreased appetite.  He has nausea but denies this as a factor.  He also feels more unsteady on his feet.  He has PMR and has some joint pain but he feels that his legs are weaker.  He has not had any falls.  He denies fever and palpitations.  The only change in medicine was from Eliquis to Pradaxa, but that was only 2 days ago.  He has history of multiple tick bites, but no recent bites or rash.    He had an outpatient MRI of the brain performed on 09/27/16, which was personally reviewed and revealed no acute findings.  It did reveal small area of gliosis in the right inferior temporal lobe, consistent with remote contusion.  He does have history of three closed head injuries as a child and adolescence with brief loss of consciousness.  Prior to onset of symptoms, he started noticing mild tremor in hands (mostly left), noticeable when holding something, such as the paper.    Recent labs: 05/29/16:  RF elevated at 24, but ANA negative, SSB/SSA antibodies negative, anti-scleroderma antibody negative, centromere antibodies negative, and cyclic citrullin peptide antibody negative 07/16/16:   Sed Rate 1, CRP 6.8, TSH 2.31 09/25/16:  CBC with WBC 7.5, HGB 16.7, HCT 47.8, PLT 195; CMP with Na 137, K 3.6, Cl 103, CO2 26, glucose 93, BUN 5, Cr 1.06,  total bili 1.3, ALP 72, AST 26 and ALT 20.  He is a former smoker.  He has no history of cancer.  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  He usually sleeps well but now is hypersomnolent.  His wife notes that he sometimes stops breathing while he is asleep.  He does not have history consistent with REM sleep behavioral disorder.  PAST MEDICAL HISTORY: Past Medical History:  Diagnosis Date  . Arthritis   . Chest pain    a. 03/2017: echo showing EF of 60-65%, no regional WMA or  significant valve abnormalities. b. 03/2016: NST with no evidence of ischemia.   . Digestive problems    on Prednisone prn for this issue- diarrhea  . Dysrhythmia    irregular due to Myocarditis- takes Atenolol, Norvasc  . GERD (gastroesophageal reflux disease)   . H/O viral myocarditis    25 years ago  . Head injury, closed, with concussion    Breif LOC  . Hyperlipidemia   . Mild mitral valve prolapse    per Dr Evette Georges notes  . PAF (paroxysmal atrial fibrillation) ([place])    a. initially occuring in 02/2016. b. recurrent in 07/2016. Placed on Eliquis  . Polymyalgia rheumatica ([place])     PAST SURGICAL HISTORY: Past Surgical History:  Procedure Laterality Date  . apendectomy  1965  . APPENDECTOMY    . bone spur Bilateral 1999   feet  . CARDIAC CATHETERIZATION  2001  . CHOLECYSTECTOMY  2004  . COLONOSCOPY    . LUMBAR LAMINECTOMY/ DECOMPRESSION WITH MET-RX Right 05/05/2013   Procedure: Right Lumbar three-four Extraforaminal Microdiskectomy with Metrex;  Surgeon: [name], MD;  Location: [facility];  Service: Neurosurgery;  Laterality: Right;  Right Lumbar three-four Extraforaminal Microdiskectomy with Metrex  . SHOULDER OPEN ROTATOR CUFF REPAIR Bilateral 2001  . TONSILLECTOMY      MEDICATIONS: Current Outpatient Prescriptions on File Prior to Visit  Medication Sig Dispense Refill  . acetaminophen (TYLENOL) 500 MG tablet Take 500 mg by mouth every 6 (six) hours as needed for mild pain.     . cetirizine (ZYRTEC) 10 MG tablet Take 10 mg by mouth daily as needed for allergies.     Marland Kitchen dabigatran (PRADAXA) 150 MG CAPS capsule Take 1 capsule (150 mg total) by mouth 2 (two) times daily. Start 12 hours after last dose of Eliquis 60 capsule 11  . diltiazem (CARDIZEM LA) 240 MG 24 hr tablet Take 1 tablet (240 mg total) by mouth daily. 90 tablet 1  . Esomeprazole Magnesium (NEXIUM 24HR) 20 MG TBEC Take 20 mg by mouth daily as needed (for heartburn).    . Multiple Vitamins-Minerals  (CENTRUM SILVER 50+MEN PO) Take 1 tablet by mouth daily.    . Omega-3 Fatty Acids (FISH OIL) 1000 MG CAPS take 2 capsules daily (Patient taking differently: Take 1,000 mg by mouth 2 (two) times daily. )    . ondansetron (ZOFRAN) 4 MG tablet Take 4 mg by mouth every 8 (eight) hours as needed for nausea or vomiting.    . tamsulosin (FLOMAX) 0.4 MG CAPS capsule Take 0.4 mg by mouth at bedtime.     . TURMERIC PO Take 1 capsule by mouth daily.     No current facility-administered medications on file prior to visit.     ALLERGIES: Allergies  Allergen Reactions  . Lidocaine Hcl Anaphylaxis    Xylocaine  . Xylocaine [Lidocaine] Anaphylaxis  . Codeine Nausea And Vomiting  . Crestor [Rosuvastatin Calcium] Other (  See Comments)    All-over body aches  . Percocet [Oxycodone-Acetaminophen] Itching  . Statins Other (See Comments)    Muscle soreness and an aching feeling all over    FAMILY HISTORY: Family History  Problem Relation Age of Onset  . Rheum arthritis Mother   . Thyroid cancer Mother   . Heart disease Father   . Asthma Father   . Thyroid cancer Sister   . Melanoma Brother     SOCIAL HISTORY: Social History   Social History  . Marital status: Married    Spouse name: N/A  . Number of children: N/A  . Years of education: N/A   Occupational History  . Not on file.   Social History Main Topics  . Smoking status: Former Smoker    Packs/day: 0.02    Years: 1.00    Types: Cigarettes    Quit date: 07/10/1971  . Smokeless tobacco: Never Used     Comment: Patient smoked 1/2 pack per week  . Alcohol use 8.4 oz/week    14 Glasses of wine per week     Comment: 1-2 glasses of wine with evening meal  . Drug use: No  . Sexual activity: Yes    Birth control/ protection: Other-see comments     Comment: old age   Other Topics Concern  . Not on file   Social History Narrative  . No narrative on file    REVIEW OF SYSTEMS: Constitutional: No fevers, chills, or sweats, no  generalized fatigue, change in appetite Eyes: No visual changes, double vision, eye pain Ear, nose and throat: No hearing loss, ear pain, nasal congestion, sore throat Cardiovascular: No chest pain, palpitations Respiratory:  No shortness of breath at rest or with exertion, wheezes GastrointestinaI: No nausea, vomiting, diarrhea, abdominal pain, fecal incontinence Genitourinary:  No dysuria, urinary retention or frequency Musculoskeletal:  No neck pain, back pain Integumentary: No rash, pruritus, skin lesions Neurological: as above Psychiatric: No depression, insomnia, anxiety Endocrine: No palpitations, fatigue, diaphoresis, mood swings, change in appetite, change in weight, increased thirst Hematologic/Lymphatic:  No purpura, petechiae. Allergic/Immunologic: no itchy/runny eyes, nasal congestion, recent allergic reactions, rashes  PHYSICAL EXAM: Vitals:   10/10/16 1301  BP: 140/88  Pulse: 94   General: Patient appears well-groomed.  During the visit, he requested to lay down on the table because he did not feel well.  He also asked me to speak softly because loud volume was uncomfortable to him. Head:  Normocephalic/atraumatic Eyes:  fundi examined but not visualized Neck: supple, no paraspinal tenderness, full range of motion Back: No paraspinal tenderness Heart: regular rate and rhythm Lungs: Clear to auscultation bilaterally. Vascular: No carotid bruits. Neurological Exam: Mental status: alert and oriented to person, place, and time, recent memory poor and remote memory intact, fund of knowledge intact, attention and concentration intact, speech fluent and not dysarthric, language intact. Montreal Cognitive Assessment  10/10/2016  Visuospatial/ Executive (0/5) 3  Naming (0/3) 3  Attention: Read list of digits (0/2) 2  Attention: Read list of letters (0/1) 1  Attention: Serial 7 subtraction starting at 100 (0/3) 3  Language: Repeat phrase (0/2) 2  Language : Fluency (0/1) 1    Abstraction (0/2) 2  Delayed Recall (0/5) 0  Orientation (0/6) 5  Total 22  Adjusted Score (based on education) 22   Cranial nerves: CN I: not tested CN II: pupils equal, round and reactive to light, visual fields intact CN III, IV, VI:  full range of motion, no nystagmus,  no ptosis CN V: facial sensation intact CN VII: upper and lower face symmetric CN VIII: hearing intact CN IX, X: gag intact, uvula midline CN XI: sternocleidomastoid and trapezius muscles intact CN XII: tongue midline Bulk & Tone: normal, no fasciculations. Motor:  5/5 throughout  Sensation:  Temperature and vibration sensation intact. Deep Tendon Reflexes:  2+ throughout, toes downgoing.  Finger to nose testing:  Without dysmetria.  Heel to shin:  Without dysmetria.  Gait:  Normal station and stride.  Able to turn but not tandem walk. Romberg negative.  IMPRESSION: 1.  Acute change in cognition with memory loss, word-finding difficulty, excessive daytime somnolence.  Unclear etiology and differential diagnosis is broad.  I am not convinced this is related to a cerebrovascular event.  MRI was negative for stroke and current symptoms are difficulty to lateralize.  Differential diagnosis may include neurodegenerative disease, autoimmune disease, infectious etiology, paraneoplastic syndrome et al. 2.  He also exhibits symptoms consistent with obstructive sleep apnea, which may explain somnolence, but not other symptoms.  PLAN: 1.  We will check blood work for HIV, RPR, B12, methylmalonic acid level, folate, heavy metals and Lyme 2.  I would like to check MRI of brain WITH contrast in order to look for abnormal enhancement to suggest underlying paraneoplastic/autoimmune/infectious etiology. 3.  I will refer you for formal neurocognitive testing (performed here in our office) 4.  Further recommendations pending results.  Further testing may include checking paraneoplastic panel or CSF analysis. 5.  We will also refer  him to a sleep specialist to evaluate for sleep apnea.    Thank you for allowing me to take part in the care of this patient.  Metta Clines, DO  CC:  [name], MD

## 2016-10-11 LAB — LYME AB/WESTERN BLOT REFLEX: B burgdorferi Ab IgG+IgM: <0.9 Index (ref <0.90)

## 2016-10-11 LAB — RPR

## 2016-10-11 LAB — HIV ANTIBODY (ROUTINE TESTING W REFLEX): HIV 1&2 Ab, 4th Generation: NONREACTIVE

## 2016-10-12 LAB — METHYLMALONIC ACID, SERUM: Methylmalonic Acid, Quant: 113 nmol/L (ref 87–318)

## 2016-10-15 NOTE — Progress Notes (Signed)
Left voicemail to call office.

## 2016-10-25 ENCOUNTER — Ambulatory Visit
Admission: RE | Admit: 2016-10-25 | Discharge: 2016-10-25 | Disposition: A | Discharge status: Home / Self Care | Payer: PPO | Source: Ambulatory Visit | Attending: Neurology | Admitting: Neurology

## 2016-10-25 DIAGNOSIS — G4730 Sleep apnea, unspecified: Secondary | ICD-10-CM

## 2016-10-25 DIAGNOSIS — R441 Visual hallucinations: Secondary | ICD-10-CM

## 2016-10-25 DIAGNOSIS — R251 Tremor, unspecified: Secondary | ICD-10-CM

## 2016-10-25 DIAGNOSIS — R413 Other amnesia: Secondary | ICD-10-CM

## 2016-10-25 DIAGNOSIS — R4789 Other speech disturbances: Secondary | ICD-10-CM

## 2016-10-26 ENCOUNTER — Ambulatory Visit (INDEPENDENT_AMBULATORY_CARE_PROVIDER_SITE_OTHER): Payer: PPO | Admitting: Adult Health

## 2016-10-26 ENCOUNTER — Encounter: Payer: Self-pay | Admitting: Adult Health

## 2016-10-26 ENCOUNTER — Ambulatory Visit (INDEPENDENT_AMBULATORY_CARE_PROVIDER_SITE_OTHER): Payer: PPO | Admitting: Pulmonary Disease

## 2016-10-26 ENCOUNTER — Ambulatory Visit (INDEPENDENT_AMBULATORY_CARE_PROVIDER_SITE_OTHER): Payer: PPO | Admitting: *Deleted

## 2016-10-26 VITALS — BP 126/82 | HR 83 | Ht 69.0 in | Wt 188.1 lb

## 2016-10-26 DIAGNOSIS — J849 Interstitial pulmonary disease, unspecified: Secondary | ICD-10-CM

## 2016-10-26 DIAGNOSIS — G4719 Other hypersomnia: Secondary | ICD-10-CM

## 2016-10-26 DIAGNOSIS — R0683 Snoring: Secondary | ICD-10-CM | POA: Diagnosis not present

## 2016-10-26 DIAGNOSIS — G4710 Hypersomnia, unspecified: Secondary | ICD-10-CM

## 2016-10-26 LAB — PULMONARY FUNCTION TEST
DL/VA % pred: 87 %
DL/VA: 4.03 ml/min/mmHg/L
DLCO cor % pred: 80 %
DLCO cor: 25.62 ml/min/mmHg
DLCO unc % pred: 84 %
DLCO unc: 26.71 ml/min/mmHg
FEF 25-75 Post: 2.9 L/sec
FEF 25-75 Pre: 2.98 L/sec
FEF2575-%Change-Post: -2 %
FEF2575-%Pred-Post: 117 %
FEF2575-%Pred-Pre: 120 %
FEV1-%Change-Post: 0 %
FEV1-%Pred-Post: 102 %
FEV1-%Pred-Pre: 102 %
FEV1-Post: 3.3 L
FEV1-Pre: 3.3 L
FEV1FVC-%Change-Post: 3 %
FEV1FVC-%Pred-Pre: 105 %
FEV6-%Change-Post: -3 %
FEV6-%Pred-Post: 98 %
FEV6-%Pred-Pre: 101 %
FEV6-Post: 4.08 L
FEV6-Pre: 4.21 L
FEV6FVC-%Change-Post: 0 %
FEV6FVC-%Pred-Post: 105 %
FEV6FVC-%Pred-Pre: 105 %
FVC-%Change-Post: -3 %
FVC-%Pred-Post: 93 %
FVC-%Pred-Pre: 96 %
FVC-Post: 4.08 L
FVC-Pre: 4.21 L
Post FEV1/FVC ratio: 81 %
Post FEV6/FVC ratio: 100 %
Pre FEV1/FVC ratio: 78 %
Pre FEV6/FVC Ratio: 100 %
RV % pred: 102 %
RV: 2.47 L
TLC % pred: 98 %
TLC: 6.79 L

## 2016-10-26 NOTE — Assessment & Plan Note (Signed)
Abnormal sleep patter, snoring and daytime sleepiness, recent dx of A Fib  ? OSA   Set up for Home sleep study  .

## 2016-10-26 NOTE — Patient Instructions (Signed)
Increase Activity as tolerated  Follow up with [name] in 6 months and As needed   Set up for home sleep study .

## 2016-10-26 NOTE — Progress Notes (Signed)
SIX MIN WALK 10/26/2016 05/01/2016  Medications Bystolic 2.5mg  and pradaxa 150 taken at 8am -  Supplimental Oxygen during Test? (L/min) No -  Laps 8 -  Partial Lap (in Meters) 0 -  Baseline BP (sitting) 130/70 -  Baseline Heartrate 81 -  Baseline Dyspnea (Borg Scale) 0.5 -  Baseline Fatigue (Borg Scale) 0.5 -  Baseline SPO2 96 -  BP (sitting) 138/76 -  Heartrate 84 -  Dyspnea (Borg Scale) 2 -  Fatigue (Borg Scale) 1 -  SPO2 99 -  BP (sitting) 136/72 -  Heartrate 77 -  SPO2 100 -  Stopped or Paused before Six Minutes No -  Distance Completed 384 -  Tech Comments: pt completed test at moderate pace with no complaints or desats.  Patient was able to attempt all 3 laps around office. Patient walked at a brisk pace. No complaints of shortness of breath or chest pain.

## 2016-10-26 NOTE — Assessment & Plan Note (Signed)
?  NSIP /viral Pnuemonitis  He is clinically improved and PFT is normal and improved  . 6 min walk test normal .  Will continue to follow  Consider repeat ct chest at 1 yr follow up .

## 2016-10-26 NOTE — Progress Notes (Signed)
Reviewed, agree PFT improving

## 2016-10-26 NOTE — Progress Notes (Signed)
PFT done today. 

## 2016-10-26 NOTE — Progress Notes (Signed)
@Patient  ID: [name], male    DOB: 1946/05/05, 71 y.o.   MRN: [id number]  Chief Complaint  Patient presents with  . Follow-up    ILD     Referring provider: [name], MD  HPI: 71 year old male former smoker followed for abnormal CT chest ? Pneumonitis   TEST  CT chest 03/2016 >Dependent linear and platelike atelectasis noted in the lower lungs bilaterally Pulmonary function testing 04/2016 Ratio 83%, FEV1 3.02 L 93%, FVC 3.06 L , total lung capacity 5. 12/17/1982 percent protected, DLCO 23.9 75% predicted  HRCT chest 05/2016 >Mild basilar predominant subpleural reticulation and ground-glass with minimal architectural distortion 04/2016 ANCA, Aldolase, CK, negative 05/2016 (off steroids)> ANA negative, CCP negative, SCL 70 negative, centromere negative, SSA/SSB negative, RF slightly elevated, Hypersensitivity pneumonitis panel negative 05/2016 HRCT > Mild subpleural reticulation and ground glass suggestive of NSIP, cannot exclude UIP   10/26/2016 Follow up : ? Pneumonitis  Patient presents for a 41-month follow-up. Patient was seen in November for pulmonary consult for significant shortness of breath. CT chest showed patchy groundglass bilaterally with patchy lower lobe atelectasis. Follow-up CT chest high resolution in January showed mild basilar predominant subpleural reticulation and groundglass suggesting nonspecific interstitial pneumonitis. On return visit in March. Patient was substantially improved with decreased shortness of breath. Activity level had returned back to baseline.  Patient says since last visit. He's been doing very well. He denies any significant shortness of breath or cough. Feels that his activity level is returned back to baseline. He works full-time. Says he does not exercise on a regular basis. PFT done today is normal and mproved from December 2017.  Pulmonary function test today shows FEV1 at 102%, ratio 81, FVC 93%, total lung capacity 98%. DLCO  84%., No significant bronchodilator response. 6 minute walk test with no desaturations on room air. Total distance 384 m  Patient does tell me that he has been diagnosed with A. Fib. He is now on Cardizem and Eliquis .  Patient wife reports that he has significant snoring and witnessed apneic events. He does feel very tired in the morning and does not refill refreshed. He he has some daytime sleepiness. Epworth score is 2. We discussed the possibility of underlying sleep apnea. We went over options of testing including home sleep study which he is agreeable to.   Allergies  Allergen Reactions  . Lidocaine Hcl Anaphylaxis    Xylocaine  . Xylocaine [Lidocaine] Anaphylaxis  . Codeine Nausea And Vomiting  . Crestor [Rosuvastatin Calcium] Other (See Comments)    All-over body aches  . Percocet [Oxycodone-Acetaminophen] Itching  . Statins Other (See Comments)    Muscle soreness and an aching feeling all over    Immunization History  Administered Date(s) Administered  . Influenza Split 03/20/2016    Past Medical History:  Diagnosis Date  . Arthritis   . Chest pain    a. 03/2017: echo showing EF of 60-65%, no regional WMA or significant valve abnormalities. b. 03/2016: NST with no evidence of ischemia.   . Digestive problems    on Prednisone prn for this issue- diarrhea  . Dysrhythmia    irregular due to Myocarditis- takes Atenolol, Norvasc  . GERD (gastroesophageal reflux disease)   . H/O viral myocarditis    25 years ago  . Head injury, closed, with concussion    Breif LOC  . Hyperlipidemia   . Mild mitral valve prolapse    per Dr Evette Georges notes  . PAF (paroxysmal  atrial fibrillation) ([place])    a. initially occuring in 02/2016. b. recurrent in 07/2016. Placed on Eliquis  . Polymyalgia rheumatica ([facility])     Tobacco History: History  Smoking Status  . Former Smoker  . Packs/day: 0.02  . Years: 1.00  . Types: Cigarettes  . Quit date: 07/10/1971  Smokeless Tobacco  . Never  Used    Comment: Patient smoked 1/2 pack per week   Counseling given: Not Answered   Outpatient Encounter Prescriptions as of 10/26/2016  Medication Sig  . acetaminophen (TYLENOL) 500 MG tablet Take 500 mg by mouth every 6 (six) hours as needed for mild pain.   Marland Kitchen BYSTOLIC 2.5 MG tablet   . cetirizine (ZYRTEC) 10 MG tablet Take 10 mg by mouth daily as needed for allergies.   Marland Kitchen dabigatran (PRADAXA) 150 MG CAPS capsule Take 1 capsule (150 mg total) by mouth 2 (two) times daily. Start 12 hours after last dose of Eliquis  . diltiazem (CARDIZEM LA) 240 MG 24 hr tablet Take 1 tablet (240 mg total) by mouth daily.  Marland Kitchen ELIQUIS 5 MG TABS tablet   . Esomeprazole Magnesium (NEXIUM 24HR) 20 MG TBEC Take 20 mg by mouth daily as needed (for heartburn).  . Multiple Vitamins-Minerals (CENTRUM SILVER 50+MEN PO) Take 1 tablet by mouth daily.  . Omega-3 Fatty Acids (FISH OIL) 1000 MG CAPS take 2 capsules daily (Patient taking differently: Take 1,000 mg by mouth 2 (two) times daily. )  . ondansetron (ZOFRAN) 4 MG tablet Take 4 mg by mouth every 8 (eight) hours as needed for nausea or vomiting.  . tamsulosin (FLOMAX) 0.4 MG CAPS capsule Take 0.4 mg by mouth at bedtime.   . TURMERIC PO Take 1 capsule by mouth daily.   No facility-administered encounter medications on file as of 10/26/2016.      Review of Systems  Constitutional:   No  weight loss, night sweats,  Fevers, chills, fatigue, or  lassitude.  HEENT:   No headaches,  Difficulty swallowing,  Tooth/dental problems, or  Sore throat,                No sneezing, itching, ear ache, nasal congestion, post nasal drip,   CV:  No chest pain,  Orthopnea, PND, swelling in lower extremities, anasarca, dizziness, palpitations, syncope.   GI  No heartburn, indigestion, abdominal pain, nausea, vomiting, diarrhea, change in bowel habits, loss of appetite, bloody stools.   Resp: No shortness of breath with exertion or at rest.  No excess mucus, no productive cough,   No non-productive cough,  No coughing up of blood.  No change in color of mucus.  No wheezing.  No chest wall deformity  Skin: no rash or lesions.  GU: no dysuria, change in color of urine, no urgency or frequency.  No flank pain, no hematuria   MS:  No joint pain or swelling.  No decreased range of motion.  No back pain.    Physical Exam  BP 126/82 (BP Location: Left Arm, Cuff Size: Normal)   Pulse 83   Ht 5\' 9"  (1.753 m)   Wt 188 lb 1.6 oz (85.3 kg)   SpO2 94%   BMI 27.78 kg/m   GEN: A/Ox3; pleasant , NAD, well nourished    HEENT:  [place]/AT,  EACs-clear, TMs-wnl, NOSE-clear, THROAT-clear, no lesions, no postnasal drip or exudate noted.   NECK:  Supple w/ fair ROM; no JVD; normal carotid impulses w/o bruits; no thyromegaly or nodules palpated; no lymphadenopathy.  RESP  Clear  P & A; w/o, wheezes/ rales/ or rhonchi. no accessory muscle use, no dullness to percussion  CARD:  RRR, no m/r/g, no peripheral edema, pulses intact, no cyanosis or clubbing.  GI:   Soft & nt; nml bowel sounds; no organomegaly or masses detected.   Musco: Warm bil, no deformities or joint swelling noted.   Neuro: alert, no focal deficits noted.    Skin: Warm, no lesions or rashes  Psych:  No change in mood or affect. No depression or anxiety.  No memory loss.  Lab Results:  CBC  BNP    Component Value Date/Time   BNP 53.0 04/02/2016 1435    ProBNP No results found for: PROBNP  Imaging: Mr Brain Wo Contrast  Result Date: 09/27/2016 CLINICAL DATA:  Hemiplegia and altered mental status. Two days of blurred vision. EXAM: MRI HEAD WITHOUT CONTRAST TECHNIQUE: Multiplanar, multiecho pulse sequences of the brain and surrounding structures were obtained without intravenous contrast. COMPARISON:  03/13/2011 FINDINGS: Brain: No acute infarction, hemorrhage, hydrocephalus, extra-axial collection or mass lesion. Small area of cortically based FLAIR hyperintensity in inferior right temporal lobe with  triangular morphology. Suspect this is gliosis, possibly posttraumatic in this patient with history of closed head injury. No generalized white matter disease or atrophy. There is a single remote microhemorrhage in the right posterior frontal white matter, nonspecific in isolation. Vascular: Incidental DVA in the lateral left frontal lobe posteriorly. Right sigmoid and upper IJ appearance on T2 weighted imaging is likely related to non dominant vessel rather than thrombosis. Skull and upper cervical spine: C2-3 disc and facet degeneration. No evidence of marrow lesion. Sinuses/Orbits: Negative IMPRESSION: 1. No acute finding.  No explanation for symptoms. 2. Small area of inferior right temporal probable gliosis, question remote contusion in in this patient with history of closed head injury. Electronically Signed   By: Monte Fantasia M.D.   On: 09/27/2016 14:53     Assessment & Plan:   ILD (interstitial lung disease) ([place]) ?NSIP /viral Pnuemonitis  He is clinically improved and PFT is normal and improved  . 6 min walk test normal .  Will continue to follow  Consider repeat ct chest at 1 yr follow up .    Snoring Abnormal sleep patter, snoring and daytime sleepiness, recent dx of A Fib  ? OSA   Set up for Home sleep study  .      Rexene Edison, NP 10/26/2016

## 2016-10-31 ENCOUNTER — Telehealth: Payer: Self-pay | Admitting: Neurology

## 2016-10-31 DIAGNOSIS — R443 Hallucinations, unspecified: Secondary | ICD-10-CM

## 2016-10-31 DIAGNOSIS — R251 Tremor, unspecified: Secondary | ICD-10-CM

## 2016-10-31 DIAGNOSIS — R4789 Other speech disturbances: Secondary | ICD-10-CM

## 2016-10-31 DIAGNOSIS — R413 Other amnesia: Secondary | ICD-10-CM

## 2016-10-31 NOTE — Telephone Encounter (Signed)
PT called and said he needs an order to be put in for an MRI to [facility]

## 2016-11-01 NOTE — Telephone Encounter (Signed)
Order placed. Unsure why this wasn't ordered at his last visit.

## 2016-11-05 NOTE — Progress Notes (Signed)
Reviewed, agree 

## 2016-11-06 DIAGNOSIS — J060 Acute laryngopharyngitis: Secondary | ICD-10-CM | POA: Diagnosis not present

## 2016-11-07 ENCOUNTER — Telehealth: Payer: Self-pay | Admitting: Neurology

## 2016-11-07 DIAGNOSIS — R413 Other amnesia: Secondary | ICD-10-CM

## 2016-11-07 NOTE — Telephone Encounter (Signed)
PT called and said he needs another order put in for [facility] for a MRI

## 2016-11-07 NOTE — Telephone Encounter (Signed)
Put in new order for MRI of brain

## 2016-11-08 ENCOUNTER — Other Ambulatory Visit: Payer: Self-pay

## 2016-11-08 DIAGNOSIS — R413 Other amnesia: Secondary | ICD-10-CM

## 2016-11-25 ENCOUNTER — Ambulatory Visit
Admission: RE | Admit: 2016-11-25 | Discharge: 2016-11-25 | Disposition: A | Discharge status: Home / Self Care | Payer: PPO | Source: Ambulatory Visit | Attending: Neurology | Admitting: Neurology

## 2016-11-25 DIAGNOSIS — R413 Other amnesia: Secondary | ICD-10-CM | POA: Diagnosis not present

## 2016-11-25 MED ORDER — GADOBENATE DIMEGLUMINE 529 MG/ML IV SOLN
18.0000 mL | Freq: Once | INTRAVENOUS | Status: AC | PRN
  Administered 2016-11-25: 18 mL via INTRAVENOUS

## 2016-11-26 ENCOUNTER — Telehealth: Payer: Self-pay | Admitting: *Deleted

## 2016-11-26 NOTE — Telephone Encounter (Signed)
Patient given results

## 2016-11-26 NOTE — Telephone Encounter (Signed)
-----   Message from Pieter Partridge, DO sent at 11/26/2016  7:13 AM EDT ----- MRI of brain is unremarkable

## 2016-11-27 ENCOUNTER — Encounter: Payer: Self-pay | Admitting: Neurology

## 2016-12-04 DIAGNOSIS — G4733 Obstructive sleep apnea (adult) (pediatric): Secondary | ICD-10-CM | POA: Diagnosis not present

## 2016-12-11 ENCOUNTER — Other Ambulatory Visit: Payer: Self-pay | Admitting: *Deleted

## 2016-12-11 DIAGNOSIS — G4719 Other hypersomnia: Secondary | ICD-10-CM

## 2016-12-11 DIAGNOSIS — G4733 Obstructive sleep apnea (adult) (pediatric): Secondary | ICD-10-CM | POA: Diagnosis not present

## 2016-12-11 DIAGNOSIS — G4710 Hypersomnia, unspecified: Secondary | ICD-10-CM

## 2016-12-13 ENCOUNTER — Encounter: Payer: Self-pay | Admitting: Adult Health

## 2016-12-13 DIAGNOSIS — G4733 Obstructive sleep apnea (adult) (pediatric): Secondary | ICD-10-CM | POA: Insufficient documentation

## 2016-12-20 ENCOUNTER — Encounter: Payer: Self-pay | Admitting: Psychology

## 2016-12-20 ENCOUNTER — Ambulatory Visit (INDEPENDENT_AMBULATORY_CARE_PROVIDER_SITE_OTHER): Payer: PPO | Admitting: Psychology

## 2016-12-20 DIAGNOSIS — R413 Other amnesia: Secondary | ICD-10-CM

## 2016-12-20 DIAGNOSIS — F4323 Adjustment disorder with mixed anxiety and depressed mood: Secondary | ICD-10-CM

## 2016-12-20 NOTE — Progress Notes (Signed)
NEUROPSYCHOLOGICAL INTERVIEW (CPT: [id number])  Name: [name] Date of Birth: [birth year] Date of Interview: 12/20/2016  Reason for Referral:  [name] is a 71 y.o. male who is referred for neuropsychological evaluation by Dr. Metta Clines of [facility] due to concerns about cognitive changes. This patient is accompanied in the office by his wife, [name], who supplements the history.  History of Presenting Problem:  [name] was seen for neurological consultation by Dr. Tomi Likens on 10/10/2016. He had abrupt onset of not feeling well on 09/25/2016. He described blurred vision of the right eye and a sensation of a "wave rushing to the right" and "dizziness with the dizziness". It lasted about 8-9 hours. He presented to the ED for evaluation and CT did not reveal any acute findings. Two days later, he had recurrence of similar symptoms but they did not resolve completely and continue to occur with fluctuation in intensity. When he saw Dr. Tomi Likens on 09/25/2016, MoCA was 22/30. He reported attention and memory lapses. He also reported two instances of visual hallucinations, significant daytime hypersomnolence and significant hypersensitivity to environmental stimulation, particularly when driving or in a car as a passenger. Dr. Tomi Likens reported unclear etiology with broad differential diagnosis. He was not convinced symptoms are related to a cerebrovascular event, as MRI was negative for stroke and current symptoms are difficult to lateralize.  He noted differential diagnosis may include neurodegenerative disease, autoimmune disease, infectious etiology, paraneoplastic syndrome. MRI with contrast was ordered as well as additional bloodwork.  MRI of the brain with and without contrast completed on 11/25/2016 did not show any acute intracranial findings. Chronic findings mentioned in the report were the following: Mild cerebral and cerebellar atrophy. Mild subcortical and periventricular T2 and FLAIR  hyperintensities, likely chronic microvascular ischemic change. Small focus of gliosis along the undersurface of the RIGHT temporal lobe, stable from priors, likely posttraumatic. Post infusion, no abnormal enhancement of the brain or meninges. There is an incidental large venous angioma, LEFT frontal white matter, which drains laterally. Vascular: Flow voids are maintained. Tiny focus of chronic hemorrhage RIGHT posterior frontal subcortical white matter, stable."  At today's visit the patient reports that he continues to have "good days and bad days". He does not feel symptoms are getting worse but they are not getting significantly better. Driving is where symptoms tend to "show up" the most. He says he cannot drive more than 60-73 mph. He also does not like to ride in a car that is traveling faster than that. One day he was driving to work and had to turn around and go home because he just feel too overwhelmed and stimulated. He denies symptoms of panic attack, however.  He reports ongoing blurred vision in his right eye as well as feeling that "everything is shifted just 2-3 inches". He also reported ongoing significant tinnitus. He has not had any more visual hallucinations. Some days he feels dizzy. In the first 2 weeks after the abrupt onset of symptoms, he slept 12-14 hours a day. This decreased over time, and he is now sleeping 8-9 hours most nights. If he is having a bad day with increased symptoms, he will take a nap. He was recently diagnosed with moderate sleep apnea after undergoing sleep study. He has not yet been set up with a CPAP.  He continues to have intermittent tremor in both hands, particularly when holding the newspaper. His wife notices this too. This started before the abrupt onset of other symptoms. Additionally, before  onset of newer symptoms, the patient had mentioned to his wife that there had been a few occasions of more significant tremor in his left hand. His wife notices that  his walking and movements are slower, and at times it seems like he is not picking up his feet when he is walking. He has not had any falls.  He continues to work. 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 His work has mostly been affected by his inability to drive at times. He has to miss a day of work about once a week. Once at the job site, he is able to do pretty much the same work he always did, but he feels that the project he is working on now is not that difficult. His wife says she has seen a small change in his thinking/processing since this all started. The patient complains of memory difficulty characterized by difficulty retrieving names of people. He denies forgetfulness for anything else. He is not having trouble managing his medications, appointments, finances. He does not have trouble remembering routes when he is driving. He has never gotten lost. He has difficulty concentrating when his symptoms are particularly bad. He processes information more slowly. He denies word finding or language difficulties.  With regard to mood, the patient reports that he is "discouraged" that he is not getting better. He denied suicidal ideation or intention. He denies prior psychiatric history. He was not experiencing any significant depression or anxiety prior to symptom onset. He has no history of mental health treatment. He denied history of substance abuse or what he would consider heavy drinking. Of note, he has drank 3 glasses of wine nightly for a long time and continues to do so.    Social History: Born/Raised: [place] Education: Master's degree in counseling.  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 He loves his work and did not have any plans to retire. His  father owned a Conservation officer, historic buildings, and now he and his brother own it.  Marital history: Married 39 years. Two children, 3 young grandchildren (they live in [place] and [place]) Alcohol: 3 glasses of wine a night. Never drank more than this. Tobacco: Former smoker SA: none   Medical History: Past Medical History:  Diagnosis Date  . Arthritis   . Chest pain    a. 03/2017: echo showing EF of 60-65%, no regional WMA or significant valve abnormalities. b. 03/2016: NST with no evidence of ischemia.   . Digestive problems    on Prednisone prn for this issue- diarrhea  . Dysrhythmia    irregular due to Myocarditis- takes Atenolol, Norvasc  . GERD (gastroesophageal reflux disease)   . H/O viral myocarditis    25 years ago  . Head injury, closed, with concussion    Breif LOC  . Hyperlipidemia   . Mild mitral valve prolapse    per Dr Evette Georges notes  . PAF (paroxysmal atrial fibrillation) ([place])    a. initially occuring in 02/2016. b. recurrent in 07/2016. Placed on Eliquis  . Polymyalgia rheumatica ([facility])      Current Medications:  Outpatient Encounter Prescriptions as of 12/20/2016  Medication Sig  . acetaminophen (TYLENOL) 500 MG tablet Take 500 mg by mouth every 6 (six) hours as needed for mild pain.   Marland Kitchen BYSTOLIC 2.5 MG tablet   .  cetirizine (ZYRTEC) 10 MG tablet Take 10 mg by mouth daily as needed for allergies.   Marland Kitchen dabigatran (PRADAXA) 150 MG CAPS capsule Take 1 capsule (150 mg total) by mouth 2 (two) times daily. Start 12 hours after last dose of Eliquis  . diltiazem (CARDIZEM LA) 240 MG 24 hr tablet Take 1 tablet (240 mg total) by mouth daily.  Marland Kitchen ELIQUIS 5 MG TABS tablet   . Esomeprazole Magnesium (NEXIUM 24HR) 20 MG TBEC Take 20 mg by mouth daily as needed (for heartburn).  . Multiple Vitamins-Minerals (CENTRUM SILVER 50+MEN PO) Take 1 tablet by mouth daily.  . Omega-3 Fatty Acids (FISH OIL) 1000 MG CAPS take 2 capsules daily (Patient taking differently: Take 1,000 mg by mouth  2 (two) times daily. )  . ondansetron (ZOFRAN) 4 MG tablet Take 4 mg by mouth every 8 (eight) hours as needed for nausea or vomiting.  . tamsulosin (FLOMAX) 0.4 MG CAPS capsule Take 0.4 mg by mouth at bedtime.   . TURMERIC PO Take 1 capsule by mouth daily.   No facility-administered encounter medications on file as of 12/20/2016.      Behavioral Observations:   Appearance: Neatly and appropriately dressed and groomed Gait: Ambulated independently, mildly slowed gait Speech: Fluent; mildly reduced rate, rhythm and volume. No significant word finding difficulty. One semantic paraphasic error that was quickly self corrected during conversational speech. Thought process: Linear, goal directed Affect: Blunted, stable Interpersonal: Pleasant, appropriate   TESTING: There is medical necessity to proceed with neuropsychological assessment as the results will be used to aid in differential diagnosis and clinical decision-making and to inform specific treatment recommendations. Per the patient, his wife and medical records reviewed, there has been a change in cognitive functioning and a reasonable suspicion of neurocognitive disorder.  Following the clinical interview, the patient completed a full battery of neuropsychological testing with my psychometrician under my supervision.   PLAN: The patient will return to see me for a follow-up session at which time his test performances and my impressions and treatment recommendations will be reviewed in detail.  Full report to follow.

## 2016-12-20 NOTE — Progress Notes (Signed)
   Neuropsychology Note  [name] came in today for 2 hours of neuropsychological testing with technician, Milana Kidney, BS, under the supervision of Dr. Macarthur Critchley. The patient did not appear overtly distressed by the testing session, per behavioral observation or via self-report to the technician. Rest breaks were offered. [name] will return within 2 weeks for a feedback session with Dr. Si Raider at which time his test performances, clinical impressions and treatment recommendations will be reviewed in detail. The patient understands he can contact our office should he require our assistance before this time.  Full report to follow.

## 2016-12-31 NOTE — Progress Notes (Signed)
NEUROPSYCHOLOGICAL EVALUATION   Name:    [name]  Date of Birth:   1945-06-25 Date of Interview:  12/20/2016 Date of Testing:  12/20/2016   Date of Feedback:  01/03/2017       Background Information:  Reason for Referral:  [name] is a 71 y.o. male referred by Dr. Metta Clines to assess his current level of cognitive functioning and assist in differential diagnosis. The current evaluation consisted of a review of available medical records, an interview with the patient and his wife, [name], and the completion of a neuropsychological testing battery. Informed consent was obtained.  History of Presenting Problem:  [name] was seen for neurological consultation by Dr. Tomi Likens on 10/10/2016. He had abrupt onset of not feeling well on 09/25/2016. He described blurred vision of the right eye and a sensation of a "wave rushing to the right" and "dizziness without the dizziness". It lasted about 8-9 hours. He presented to the ED for evaluation and CT did not reveal any acute findings. Two days later, he had recurrence of similar symptoms but they did not resolve completely and continue to occur with fluctuation in intensity. When he saw Dr. Tomi Likens on 09/25/2016, MoCA was 22/30. He reported attention and memory lapses. He also reported two instances of visual hallucinations, significant daytime hypersomnolence and significant hypersensitivity to environmental stimulation, particularly when driving or in a car as a passenger. Dr. Tomi Likens reported unclear etiology with broad differential diagnosis. He was not convinced symptoms were related to a cerebrovascular event, asMRI was negative for stroke and current symptoms are difficult to lateralize. He noted differential diagnosis may include neurodegenerative disease, autoimmune disease, infectious etiology, paraneoplastic syndrome. MRI with contrast was ordered as well as additional bloodwork.  MRI of the brain with and without contrast  completed on 11/25/2016 did not show any acute intracranial findings. Chronic findings mentioned in the report were the following: Mild cerebral and cerebellar atrophy. Mild subcortical and periventricular T2 and FLAIR hyperintensities, likely chronic microvascular ischemic change. Small focus of gliosis along the undersurface of the RIGHT temporal lobe, stable from priors, likely posttraumatic. Post infusion, no abnormal enhancement of the brain or meninges. There is an incidental large venous angioma, LEFT frontal white matter, which drains laterally. Vascular: Flow voids are maintained. Tiny focus of chronic hemorrhage RIGHT posterior frontal subcortical white matter, stable."  At today's visit the patient reports that he continues to have "good days and bad days". He does not feel symptoms are getting worse but they are not getting significantly better. Driving is where symptoms tend to "show up" the most. He says he cannot drive more than 55-73 mph. He also does not like to ride in a car that is traveling faster than that. One day he was driving to work and had to turn around and go home because he just feel too overwhelmed and stimulated. He denies symptoms of panic attack, however.  He reports ongoing blurred vision in his right eye as well as feeling that "everything is shifted just 2-3 inches". He also reported ongoing significant tinnitus. He has not had any more visual hallucinations. Some days he feels dizzy. In the first 2 weeks after the abrupt onset of symptoms, he slept 12-14 hours a day. This decreased over time, and he is now sleeping 8-9 hours most nights. If he is having a bad day with increased symptoms, he will take a nap. He was recently diagnosed with moderate sleep apnea after undergoing sleep study.  He has not yet been set up with a CPAP.  He continues to have intermittent tremor in both hands, particularly when holding the newspaper. His wife notices this too. This started before  the abrupt onset of other symptoms. Additionally, before onset of newer symptoms, the patient had mentioned to his wife that there had been a few occasions of more significant tremor in his left hand. His wife notices that his walking and movements are slower, and at times it seems like he is not picking up his feet when he is walking. He has not had any falls.  He continues to work. 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 His work has mostly been affected by his inability to drive at times. He has to miss a day of work about once a week. Once at the job site, he is able to do pretty much the same work he always did, but he feels that the project he is working on now is not that difficult. His wife says she has seen a small change in his thinking/processing since this all started. The patient complains of memory difficulty characterized by difficulty retrieving names of people. He denies forgetfulness for anything else. He is not having trouble managing his medications, appointments, finances. He does not have trouble remembering routes when he is driving. He has never gotten lost. He has difficulty concentrating when his symptoms are particularly bad. He processes information more slowly. He denies word finding or language difficulties.  With regard to mood, the patient reports that he is "discouraged" that he is not getting better. He denied suicidal ideation or intention. He denies prior psychiatric history. He was not experiencing any significant depression or anxiety prior to symptom onset. He has no history of mental health treatment. He denied history of substance abuse or what he would consider heavy drinking. Of note, he has drank 3 glasses of wine nightly for a long time and continues to do so.    Social History: Born/Raised: [place] Education: Master's degree in counseling.  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 He loves his work and did not have any plans to retire. His father owned a Conservation officer, historic buildings, and now he and his brother own it.  Marital history: Married 39 years. Two children, 3 young grandchildren (they live in [place] and [place]) Alcohol: 3 glasses of wine a night. Never drank more than this. Tobacco: Former smoker SA: none   Medical History:  Past Medical History:  Diagnosis Date  . Arthritis   . Chest pain    a. 03/2017: echo showing EF of 60-65%, no regional WMA or significant valve abnormalities. b. 03/2016: NST with no evidence of ischemia.   . Digestive problems    on Prednisone prn for this issue- diarrhea  . Dysrhythmia    irregular due to Myocarditis- takes Atenolol, Norvasc  . GERD (gastroesophageal reflux disease)   . H/O viral myocarditis    25 years ago  . Head injury, closed, with concussion    Breif LOC  . Hyperlipidemia   . Mild mitral valve prolapse    per Dr Evette Georges notes  . PAF (paroxysmal atrial fibrillation) ([place])    a. initially occuring in 02/2016. b. recurrent in 07/2016. Placed on Eliquis  . Polymyalgia rheumatica ([place])  Current medications:  Outpatient Encounter Prescriptions as of 01/03/2017  Medication Sig  . acetaminophen (TYLENOL) 500 MG tablet Take 500 mg by mouth every 6 (six) hours as needed for mild pain.   Marland Kitchen BYSTOLIC 2.5 MG tablet   . cetirizine (ZYRTEC) 10 MG tablet Take 10 mg by mouth daily as needed for allergies.   Marland Kitchen dabigatran (PRADAXA) 150 MG CAPS capsule Take 1 capsule (150 mg total) by mouth 2 (two) times daily. Start 12 hours after last dose of Eliquis  . diltiazem (CARDIZEM LA) 240 MG 24 hr tablet Take 1 tablet (240 mg total) by mouth daily.  Marland Kitchen ELIQUIS 5 MG TABS tablet   . Esomeprazole Magnesium (NEXIUM 24HR) 20 MG TBEC Take 20 mg by mouth daily as needed  (for heartburn).  . Multiple Vitamins-Minerals (CENTRUM SILVER 50+MEN PO) Take 1 tablet by mouth daily.  . Omega-3 Fatty Acids (FISH OIL) 1000 MG CAPS take 2 capsules daily (Patient taking differently: Take 1,000 mg by mouth 2 (two) times daily. )  . ondansetron (ZOFRAN) 4 MG tablet Take 4 mg by mouth every 8 (eight) hours as needed for nausea or vomiting.  . tamsulosin (FLOMAX) 0.4 MG CAPS capsule Take 0.4 mg by mouth at bedtime.   . TURMERIC PO Take 1 capsule by mouth daily.   No facility-administered encounter medications on file as of 01/03/2017.      Current Examination:  Behavioral Observations:  Appearance: Neatly and appropriately dressed and groomed Gait: Ambulated independently, mildly slowed gait Speech: Fluent; mildly reduced rate, rhythm and volume. No significant word finding difficulty. One semantic paraphasic error that was quickly self corrected during conversational speech. Thought process: Linear, goal directed Affect: Blunted, stable Interpersonal: Pleasant, appropriate Orientation: Oriented to all spheres. Accurately named the current President and his predecessor.   Tests Administered: . Test of Premorbid Functioning (TOPF) . Wechsler Adult Intelligence Scale-Fourth Edition (WAIS-IV): Similarities, Block Design, Coding, Digit Span, Matrix Reasoning, and Arithmetic subtests . Wechsler Memory Scale-Fourth Edition (WMS-IV) Older Adult Version (ages 34-90): Logical Memory I, II and Recognition subtests  . [place] Verbal Learning Test - 2nd Edition (CVLT-2) Standard Form . Repeatable Battery for the Assessment of Neuropsychological Status (RBANS) Form A:  Figure Copy and Recall subtests and Semantic Fluency subtest . Neuropsychological Assessment Battery (NAB) Language Module, Form 1: Naming subtest . Boston Diagnostic Aphasia Examination: Complex Ideational Material subtest . Controlled Oral Word Association Test (COWAT) . Trail Making Test A and B . Clock drawing  test . Beck Depression Inventory - 2nd Edition (BDI-II) . Generalized Anxiety Disorder - 7 item screener (GAD-7) . Beck Anxiety Inventory (BAI)  Test Results: Note: Standardized scores are presented only for use by appropriately trained professionals and to allow for any future test-retest comparison. These scores should not be interpreted without consideration of all the information that is contained in the rest of the report. The most recent standardization samples from the test publisher or other sources were used whenever possible to derive standard scores; scores were corrected for age, gender, ethnicity and education when available.   Test Scores:  Test Name Raw Score Standardized Score Descriptor  TOPF 58/70 SS= 115 High average  WAIS-IV Subtests     Similarities 21/36 ss= 8 Average   Block Design 51/66 ss= 15 Superior  Coding 45/135 ss= 8 Average  Digit Span  25/48 ss= 10 Average  Matrix Reasoning 15/26 ss= 11 Average  Arithmetic 12/22 ss= 9 Average  WAIS-IV Index Score     Working Memory  SS= 97 Average  WMS-IV Subtests     LM I 33/53 ss= 10 Average  LM II 18/39 ss= 10 Average  LM II Recognition 19/23 Cum %: 51-75 WNL  RBANS Subtests     Figure Copy 18/20 Z= -0.1 Average  Figure Recall 11/20 Z= -0.7 Average  Semantic Fluency 12 Z= -2 Impaired  CVLT-II Scores     Trial 1 2/16 Z= -3 Severely impaired  Trial 5 10/16 Z= 0 Average  Trials 1-5 total 32/80 T= 40 Low average  SD Free Recall 4/16 Z= -1.5 Borderline  SD Cued Recall 4/16 Z= -2.5 Impaired  LD Free Recall 4/16 Z= -1.5 Borderline   LD Cued Recall 5/16 Z= -2 Impaired  Recognition Discriminability 9/16 hits, 4 false positives Z= -1.5 Borderline  Forced Choice Recognition 15/16  Below expectation  NAB Language subtest     Naming 31/31 T= 55 Average  BDAE Subtest     Complex Ideational Material 12/12  WNL  COWAT-FAS 32 T= 42 Low average  COWAT-Animals 17 T= 47 Average  Trail Making Test A  32" 0 errors T= 53  Average   Trail Making Test B  63" 0 errors T= 55 Average  Clock Drawing   WNL  BDI-II 11/63  WNL  GAD-7 1/21  WNL  BAI 8/63  Mild      Description of Test Results:  Premorbid verbal intellectual abilities were estimated to have been within the high average range based on a test of word reading. Psychomotor processing speed was average. Auditory attention and working memory were average. Visual-spatial construction was average to superior. Language abilities assessed were generally within normal limits. Specifically, confrontation naming was intact, while semantic verbal fluency ranged from impaired (for fruits/vegetables) to average (for animals). Auditory comprehension of complex ideational material was intact. With regard to verbal memory, encoding and acquisition of non-contextual information (i.e., word list) was low average across five learning trials with a steep positive learning curve. After an interference task, free recall was borderline impaired (4/9 items recalled). He did not benefit from semantic cueing (impaired). After a delay, free recall was borderline impaired, again recalling 4/16 items. He recalled one additional word with semantic cueing but this still fell within the impaired range. Performance on a yes/no recognition task was borderline impaired, and performance on a forced choice recognition task was below expectation as well. Meanwhile, on another verbal memory test, encoding and acquisition of contextual auditory information (i.e., short stories) was average. After a delay, free recall was average. Performance on a yes/no recognition task was intact. With regard to non-verbal memory, delayed free recall of visual information was average. Performances on tasks measuring various aspects of executive functioning were within normal limits overall. Mental flexibility and set-shifting were average on Trails B. Verbal fluency with phonemic search restrictions was low average.  Verbal abstract reasoning was average. Non-verbal abstract reasoning was average. Performance on a clock drawing task was intact. On a self-report measure of mood, the patient's responses were not indicative of clinically significant depression at the present time, but he did endorse some depressive symptoms including pessimism, anhedonia, guilty feelings, loss of interest in people/activities, indecisiveness, loss of energy, increased sleep, reduced appetite, reduced concentration, and fatigue.  On a self-report measure of anxiety, the patient did not endorse clinically significant generalized anxiety at the present time (e.g., he did not endorse excessive worrying, inability to control worry, difficulty relaxing, irritability, fear of something awful happening, or restlessness).    Clinical Impressions: Adjustment  disorder with anxiety. Neurocognitive test results are largely within normal limits. There is no clear evidence of a memory disorder or dementia. He did demonstrate difficulty on word list memory task, but this seemed due to overwhelm as opposed to memory deficit (there was evidence of an interference effect), and performance on all other memory tests was intact. If any cognitive diagnosis is possible, it would be mild cognitive impairment with probable frontal-subcortical involvement. Untreated sleep apnea can contribute to such a pattern. However, based on all data available at this time, it is my opinion that anxiety is the primary factor contributing to his subjective cognitive complaints. The etiology of his neurologic symptoms (e.g., blurred vision in one eye, increased tinnitus, dizziness, hypersensitivity to external stimuli) remains unclear, which is anxiety provoking to him and has likely in turn resulted in hypervigilance of functioning. He may be more likely to notice mild everyday symptoms and interpret them as more significant. As a result, his attention and concentration for other  activities may be diminished and therefore perceived as cognitive decline.  Recommendations/Plan: Based on the findings of the present evaluation, the following recommendations are offered:  1. I reassured him that there are no clear signs of underlying neurocognitive disorder or dementia.  2. I provided education/counseling regarding the impact of stress on cognitive functioning. 3. He can utilize behavioral strategies to enhance attention/memory in daily life. 4. Neurocognitive re-testing could be considered in a year if he continues to report cognitive symptoms. 5. He was advised of general recommendations to limit alcohol intake to 2 or less drinks per day in order to reduce risk of alcohol related cognitive disorder and vascular cognitive impairment. (He is currently drinking 3 glasses of wine nightly). 6. CPAP treatment of OSA was highly recommended, and I educated him regarding the effects of untreated sleep apnea on white matter and cognitive functioning, and how these can be reversed with regular CPAP use. 7. If he continues to experience significant anxiety when driving, he may wish to see a psychologist for time limited behavioral treatment of driving phobia.  Feedback to Patient: [name] and his wife returned for a feedback appointment on 01/03/2017 to review the results of his neuropsychological evaluation with this provider. 20 minutes face-to-face time was spent reviewing his test results, my impressions and my recommendations as detailed above.    Total time spent on this patient's case: [id number] unit for interview with psychologist; [phone] units of testing by psychometrician under psychologist's supervision; [phone] units for medical record review, scoring of neuropsychological tests, interpretation of test results, preparation of this report, and review of results to the patient by psychologist.      Thank you for your referral of [facility]. Please feel free  to contact me if you have any questions or concerns regarding this report.

## 2017-01-03 ENCOUNTER — Other Ambulatory Visit: Payer: Self-pay | Admitting: Adult Health

## 2017-01-03 ENCOUNTER — Ambulatory Visit (INDEPENDENT_AMBULATORY_CARE_PROVIDER_SITE_OTHER): Payer: PPO | Admitting: Psychology

## 2017-01-03 ENCOUNTER — Encounter: Payer: Self-pay | Admitting: Psychology

## 2017-01-03 DIAGNOSIS — R413 Other amnesia: Secondary | ICD-10-CM

## 2017-01-03 DIAGNOSIS — G4733 Obstructive sleep apnea (adult) (pediatric): Secondary | ICD-10-CM

## 2017-01-03 NOTE — Patient Instructions (Addendum)
1. Your test results were reassuring. There are no clear indicators of underlying neurocognitive disorder or dementia. 2. Etiology of your neurologic symptoms remains unclear. 3. Stress related to this may be contributing to reduced cognitive functioning in daily life. (see below) 4. Some strategies may be useful to enhance attention/memory in daily life (see below) 5. Reducing alcohol intake (ideally to 2 or less drinks per day) is recommended in order to reduce risk of alcohol related cognitive disorder and vascular cognitive impairment 6. CPAP treatment of OSA is also highly recommended, as untreated sleep apnea can cause cognitive impairment, but this is potentially reversible 7. If you continue to have anxiety related to driving, working with a psychologist may be useful.   How STRESS and ANXIETY affect your ability to pay attention  . In times of stress, it is common to have difficulty concentrating, paying attention and making decisions . Various anxiety disorders also interfere with attention and concentration . Problems with attention and concentration can disrupt the process of learning and making new memories, which can make it seem like there is a problem with your memory. In your daily life, you may experience this disruption as forgetting names and appointments, misplacing items, and needing to make lists for shopping and errands  . Regardless of the test scores on neuropsychological evaluation, psychosocial stress can interfere with the ability to make use of your cognitive resources and function optimally across settings such as work or school, maintaining the home and responsibilities, and personal relationships  . Assessment and monitoring of symptoms o A thorough psychological evaluation is important in order to assess current symptoms and determine the best treatment o The assessment provides a starting point to monitor symptom improvement  . Treatment o Psychotherapy:  Individual and group psychotherapy have been shown to be effective in improving the symptoms of depression and anxiety disorders, and typically cognitive difficulties improve with symptom reduction o Mindfulness based interventions have been shown to be effective in improving stress management o Behavioral strategies for improving attention and concentration in your daily life may be helpful.   . Pleasurable activities, exercise, daily structure o People who have high stress levels often stop engaging in activities that they enjoy, which can have an additional negative effect on their mood. It is recommended that all patients set aside time in their schedule to engage in pleasurable activities because this has been shown to help alleviate many types of symptoms. Examples of pleasurable activities include taking a walk/spending time outdoors, reading or seeing a movie, spending time with friends and/or family, listening to music, and volunteering o In addition to the numerous physical health benefits of exercise, there are many psychological and cognitive benefits as well.  o Maintaining a regular schedule is also recommended, such as setting regular sleep/wake times and scheduling activities such as social outings    Strategies to enhance cognitive functioning Attention, concentration, memory encoding and consolidation    . Make a plan and be prepared o If you find that you are more attentive at certain times of the day (i.e., the morning), plan important activities and discussions at that time o Determine which activities take the most time and which are most important, then prioritize your "to do list" based on this information o Break tasks into simpler parts, understand the steps involved before starting o Rehearse the steps mentally or write them down. If you write them down, you can use this as a checklist to check off as you complete them.  o Visualize completing the task  . Use external  aids  o Write everything down that you do not need to know or work with right now. Don't store extra information in your brain that you don't need right now.  o Use a calendar or planner to make checklists, due dates and "to do" lists. o Use ONLY ONE calendar or planner and look at it frequently o Set alarms for important deadlines or appointments  . Minimize interruptions and distractions  o Find a good work environment, e.g., quiet room with a desk, close curtains, use earplugs, mask sounds with a fan or white noise machine o Turn off cell phone and/or email alerts during important tasks. In fact, it is helpful to schedule a block of time each day where you limit phone and email interruptions and focus on just the more detailed work you have. o Try to minimize the amount of background noise (i.e., television, music) when engaged in important tasks or conversations with others (note that some individuals find soft background music helpful in minimize distraction, so you may need to experiment with optimal level of noise for specific situations)  . Use active effort = consciously attending to details, closely analyzing o Failures of encoding may reflect failure to attend to one's own actions o Be prepared to work more slowly than you usually do  o When reading, allow time for re-reading sections  o Check your work for errors  . Avoid multitasking o Do not attempt to complete more than one task at one time. Focus on one task until it is completed and then move on to the next one. o Avoid other activities while engaged in important tasks, such as talking on the phone while balancing the checkbook, making a shopping list during a meeting.   . Use self-talk during tasks o Repeat the steps of the activity to yourself as you complete them o Talk to yourself about your progress o This helps you maintain focus on the task and makes it easier to remember completing the task (Similar to "active effort"  above)  . Conserve energy o Conserve energy to avoid fatigue and its effects on cognition - Get enough sleep - Pace yourself  and make sure to take breaks - Be open to receiving help - Exercise for increased energy  . Conversational vigilance = paying attention during a conversation  o Listen actively: focus on the speaker and position yourself so that you can clearly hear the him/her, and have open/relaxed posture  o Eye contact: Maintaining eye contact with the person you are speaking with may increase the likelihood that important information is properly received  o Ask questions: Ask questions for clarification (e.g., request that the speaker explain something in a different way) or ask for information to be repeated if you become distracted, or if you do not hear or understand something during a conversation  o Paraphrase: Summarize or repeat back important information from a conversation in your own words to facilitate communication and ensure that you have heard correctly and understand

## 2017-01-03 NOTE — Progress Notes (Signed)
Spoke with the pt  He states that he has all of the information that he needs and he wants to go ahead and start on CPAP. Order sent to [facility].

## 2017-01-15 DIAGNOSIS — H3530 Unspecified macular degeneration: Secondary | ICD-10-CM | POA: Diagnosis not present

## 2017-01-15 DIAGNOSIS — H52223 Regular astigmatism, bilateral: Secondary | ICD-10-CM | POA: Diagnosis not present

## 2017-01-15 DIAGNOSIS — H5213 Myopia, bilateral: Secondary | ICD-10-CM | POA: Diagnosis not present

## 2017-01-15 DIAGNOSIS — H524 Presbyopia: Secondary | ICD-10-CM | POA: Diagnosis not present

## 2017-01-15 DIAGNOSIS — R7301 Impaired fasting glucose: Secondary | ICD-10-CM | POA: Diagnosis not present

## 2017-01-15 DIAGNOSIS — I10 Essential (primary) hypertension: Secondary | ICD-10-CM | POA: Diagnosis not present

## 2017-01-17 DIAGNOSIS — G4733 Obstructive sleep apnea (adult) (pediatric): Secondary | ICD-10-CM | POA: Diagnosis not present

## 2017-01-17 DIAGNOSIS — I10 Essential (primary) hypertension: Secondary | ICD-10-CM | POA: Diagnosis not present

## 2017-01-17 DIAGNOSIS — I4891 Unspecified atrial fibrillation: Secondary | ICD-10-CM | POA: Diagnosis not present

## 2017-01-17 DIAGNOSIS — R7301 Impaired fasting glucose: Secondary | ICD-10-CM | POA: Diagnosis not present

## 2017-01-17 DIAGNOSIS — N401 Enlarged prostate with lower urinary tract symptoms: Secondary | ICD-10-CM | POA: Diagnosis not present

## 2017-01-17 DIAGNOSIS — E782 Mixed hyperlipidemia: Secondary | ICD-10-CM | POA: Diagnosis not present

## 2017-02-07 ENCOUNTER — Ambulatory Visit (INDEPENDENT_AMBULATORY_CARE_PROVIDER_SITE_OTHER): Payer: PPO | Admitting: Cardiovascular Disease

## 2017-02-07 ENCOUNTER — Encounter: Payer: Self-pay | Admitting: Cardiovascular Disease

## 2017-02-07 VITALS — BP 113/69 | HR 65 | Ht 69.0 in | Wt 192.0 lb

## 2017-02-07 DIAGNOSIS — G4733 Obstructive sleep apnea (adult) (pediatric): Secondary | ICD-10-CM

## 2017-02-07 DIAGNOSIS — I10 Essential (primary) hypertension: Secondary | ICD-10-CM

## 2017-02-07 DIAGNOSIS — J189 Pneumonia, unspecified organism: Secondary | ICD-10-CM

## 2017-02-07 DIAGNOSIS — Z79899 Other long term (current) drug therapy: Secondary | ICD-10-CM

## 2017-02-07 DIAGNOSIS — E782 Mixed hyperlipidemia: Secondary | ICD-10-CM | POA: Diagnosis not present

## 2017-02-07 DIAGNOSIS — I480 Paroxysmal atrial fibrillation: Secondary | ICD-10-CM

## 2017-02-07 DIAGNOSIS — M353 Polymyalgia rheumatica: Secondary | ICD-10-CM

## 2017-02-07 DIAGNOSIS — I700 Atherosclerosis of aorta: Secondary | ICD-10-CM

## 2017-02-07 MED ORDER — EZETIMIBE 10 MG PO TABS: 10.0000 mg | ORAL_TABLET | Freq: Every day | ORAL | 3 refills | Status: DC

## 2017-02-07 MED ORDER — METOPROLOL TARTRATE 25 MG PO TABS: 25.0000 mg | ORAL_TABLET | Freq: Two times a day (BID) | ORAL | 3 refills | Status: DC

## 2017-02-07 NOTE — Patient Instructions (Signed)
Medication Instructions:  When you run out of Bystolic, STOP and start metoprolol.   START metoprolol tartrate (Lopressor) 25mg  two times daily --start with 12.5. Mg (1/2 tablet) two times daily to make sure you tolerate okay. --if HR continues in 80-90s, increase to 25mg  (1 tablet) two times daily.  START Zetia 10mg  (1 tablet) daily  Follow-Up: Your physician wants you to follow-up in: 4 MONTHS with Dr. Claiborne Billings. You will receive a reminder letter in the mail two months in advance. If you don't receive a letter, please call our office to schedule the follow-up appointment.   Any Other Special Instructions Will Be Listed Below (If Applicable).     If you need a refill on your cardiac medications before your next appointment, please call your pharmacy.

## 2017-02-07 NOTE — Progress Notes (Signed)
[name], male   DOB: 09-Aug-1945, 71 y.o.   MRN: [id number]    Primary M.D.: Dr. Wende Neighbors  HPI: [name] is a 71 y.o. male who presents for a 6 month cardiology follow-up evaluation.   [name] has remote history of viral myocarditis in 1989 at which time his ejection fraction was 25%. He subsequently normalized LV function. He has a history of mild mitral valve prolapse. Cardiac catheterization in 2001 showed mild mid systolic LAD bridging. He has been noted to have mild T-wave changes on his ECG. Additional problems include hypertension as well as hyperlipidemia. He was unable to tolerate Lipitor. He initially tolerated Crestor but then developed significant myalgias.  He has been able to tolerate Zetia 10 mg daily.   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  Apparently, this has stabilized and is tired.  Ears have almost normalized.  He has been starting to resume very small amounts of red meat.    Past year, he has remained active.  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  He denies any episodes of chest pain, or change in exercise tolerance.  On 02/01/2015.  He underwent lumbar surgery by [name] involving L2-L3, L3-L4, and L4-L5.  He tolerated surgery without cardiovascular compromise.  When I last saw him in January 2017 he was on amlodipine 2.5 mg and atenolol 25 g twice a day, which has been helpful for his blood pressure, palpitations, as well as documented systolic LAD muscle bridging.    Over the past month, he has a definite change him to hematology and has developed progressive shortness of breath with activity.  He denies any significant chest pain.  He has seen [name], [facility] and when he was last seen by him one week ago I  went over his symptom complex and recommended a workup strategy.  He underwent an echo Doppler study which was done on 03/22/2016 and this showed normal systolic  function with an EF of 60-65% with grade 1 diastolic dysfunction.  His aorta was upper normal to mildly dilated at 38 mm.  There was no significant valvular pathology.  There was no TR Doppler jets an estimated PA systolic pressure was not able to be obtained.  Due to his shortness of breath.  He also underwent a CT of his chest.  He had dependent linear and platelike atelectasis in lower lungs bilaterally.  The patient has a remote exposure to asbestosis in his 38s, but his CT scan did not reveal any evidence for pleural based calcification.  There were diffuse atelectatic changes in the lungs bilaterally.  He also underwent a nuclear perfusion study which showed normal perfusion and function without scar or ischemia.  I had sent off laboratory was notable for a normal BMP 53.  Troponins were negative.  He had been prescribed levaquin by his primary physician as empiric therapy for a mild fever.  His fever resolved but he continues to experience shortness of breath.  His white blood count 13 days ago was 11.2.  He was macrocytic with an MCV of 102 with a normal hemoglobin at 16.2, hematocrit of 46.0.  B12 and folate levels were normal.  An erythrocyte sedimentation rate was increased at 61 and a high-sensitivity C-reactive protein was significantly increased at 139.    I referred him to [name] for pulmonary evaluation for possible interstitial lung disease and  also [name] for rheumatologic follow-up.  He was given increased steroids.  In November 2017.  A high resolution CT scan of the chest showed patchy groundglass bilaterally andpatchy lower lobe atelectasis.  A barium swallow did not show evidence for esophageal disorder.  Antineutrophil cytoplasmic antibody testing was negative.  A follow-up high-resolution CT was done on June 12, 2016, which showed mild basilar predominant subpleural reticulation and groundglass suggesting nonspecific interstitial pneumonitis, however, will interstitial  pneumonitis could not be excluded.  He was also noted to have aortic atherosclerosis and a punctate stone in his left kidney.  Since I last saw him, he continues to feel improved.  Subsequent laboratory had shown a sedimentation rate which improved from 61 to  1 and CRP which had improved from 139 to 6.8.  His lipids were elevated with a total cholesterol 222, triglycerides 359, HDL 67, VLDL 72, and LDL 83.  He developed myalgias with Crestor and Lipitor.  He denies chest pain. He is concerned about the cost of soe ofhis medicatios, partiyby.  He continues to be on pradaxa for anticoagulation and long-acting Cardizem.  He is unaware of any recent arrhythmia.  He had follow-up blood work on 01/15/2017 by Wende Neighbors in [place].  Hemoglobin 17.5, hematocrit 48.7.  Lipid studies were improved with a total cholesterol 220, triglycerides 175, HDL 72, LDL 113.  Hemoglobin A1c was 5.7.  He is on CPAP therapy.  He nasal pillows, small size, the D[employer] [facility].  Ms. Mannis feels well.  He is much less short of breath.  He denies chest tightness.  He is unaware of any arrhythmias.  He presents for evaluation.  Past Medical History:  Diagnosis Date  . Arthritis   . Chest pain    a. 03/2017: echo showing EF of 60-65%, no regional WMA or significant valve abnormalities. b. 03/2016: NST with no evidence of ischemia.   . Digestive problems    on Prednisone prn for this issue- diarrhea  . Dysrhythmia    irregular due to Myocarditis- takes Atenolol, Norvasc  . GERD (gastroesophageal reflux disease)   . H/O viral myocarditis    25 years ago  . Head injury, closed, with concussion    Breif LOC  . Hyperlipidemia   . Mild mitral valve prolapse    per Dr Evette Georges notes  . PAF (paroxysmal atrial fibrillation) ([place])    a. initially occuring in 02/2016. b. recurrent in 07/2016. Placed on Eliquis  . Polymyalgia rheumatica (HCC)     Past Surgical History:  Procedure Laterality Date  .  apendectomy  1965  . APPENDECTOMY    . bone spur Bilateral 1999   feet  . CARDIAC CATHETERIZATION  2001  . CHOLECYSTECTOMY  2004  . COLONOSCOPY    . LUMBAR LAMINECTOMY/ DECOMPRESSION WITH MET-RX Right 05/05/2013   Procedure: Right Lumbar three-four Extraforaminal Microdiskectomy with Metrex;  Surgeon: [name], MD;  Location: [facility];  Service: Neurosurgery;  Laterality: Right;  Right Lumbar three-four Extraforaminal Microdiskectomy with Metrex  . SHOULDER OPEN ROTATOR CUFF REPAIR Bilateral 2001  . TONSILLECTOMY      Allergies  Allergen Reactions  . Lidocaine Hcl Anaphylaxis    Xylocaine  . Xylocaine [Lidocaine] Anaphylaxis  . Codeine Nausea And Vomiting  . Crestor [Rosuvastatin Calcium] Other (See Comments)    All-over body aches  . Percocet [Oxycodone-Acetaminophen] Itching  . Statins Other (See Comments)    Muscle soreness and an aching feeling all over    Current Outpatient Prescriptions  Medication Sig Dispense Refill  . acetaminophen (TYLENOL) 500 MG tablet Take 500 mg by mouth every 6 (six) hours as needed for mild pain.     . cetirizine (ZYRTEC) 10 MG tablet Take 10 mg by mouth daily as needed for allergies.     Marland Kitchen dabigatran (PRADAXA) 150 MG CAPS capsule Take 1 capsule (150 mg total) by mouth 2 (two) times daily. Start 12 hours after last dose of Eliquis 60 capsule 11  . diltiazem (CARDIZEM LA) 240 MG 24 hr tablet Take 1 tablet (240 mg total) by mouth daily. 90 tablet 1  . Multiple Vitamins-Minerals (CENTRUM SILVER 50+MEN PO) Take 1 tablet by mouth daily.    . Omega-3 Fatty Acids (FISH OIL) 1000 MG CAPS take 2 capsules daily (Patient taking differently: Take 1,000 mg by mouth 2 (two) times daily. )    . ondansetron (ZOFRAN) 4 MG tablet Take 4 mg by mouth every 8 (eight) hours as needed for nausea or vomiting.    . tamsulosin (FLOMAX) 0.4 MG CAPS capsule Take 0.4 mg by mouth at bedtime.     Marland Kitchen ezetimibe (ZETIA) 10 MG tablet Take 1 tablet (10 mg total) by mouth  daily. 90 tablet 3  . metoprolol tartrate (LOPRESSOR) 25 MG tablet Take 1 tablet (25 mg total) by mouth 2 (two) times daily. 180 tablet 3   No current facility-administered medications for this visit.     Social History   Social History  . Marital status: Married    Spouse name: N/A  . Number of children: N/A  . Years of education: N/A   Occupational History  . Not on file.   Social History Main Topics  . Smoking status: Former Smoker    Packs/day: 0.02    Years: 1.00    Types: Cigarettes    Quit date: 07/10/1971  . Smokeless tobacco: Never Used     Comment: Patient smoked 1/2 pack per week  . Alcohol use 12.6 oz/week    21 Glasses of wine per week     Comment: 3 glasses of wine nightly - 12/20/2016  . Drug use: No  . Sexual activity: Yes    Birth control/ protection: Other-see comments     Comment: old age   Other Topics Concern  . Not on file   Social History Narrative  . 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 He is married has 2 children. There is no tobacco use. He stays active. There is no alcohol use.  He is still working but had a significantly reduced pace than he had previously.  Family history is notable that both parents are deceased.  Mother died of old age.  Father died but had a history of mitral valve prolapse.  He has 2 brothers and one sister who are  alive and well.  ROS General: Negative; No fevers, chills, or night sweats;  HEENT: Negative; No changes in vision or hearing, sinus congestion, difficulty swallowing Pulmonary:  shortness of breath with activity Cardiovascular: See history of present illness GI: No recent diarrhea. GU: Negative; No dysuria, hematuria, or difficulty voiding Musculoskeletal: Negative; no myalgias, joint pain, or weakness Hematologic/Oncology: Negative; no easy bruising, bleeding Endocrine: Negative; no heat/cold intolerance; no diabetes Neuro: Negative; no changes in balance, headaches Skin: Negative; No  rashes or skin lesions Psychiatric: Negative; No behavioral problems, depression Sleep: Negative; No snoring, daytime sleepiness, hypersomnolence, bruxism, restless legs, hypnogognic hallucinations, no cataplexy Other comprehensive 14 point system review is negative.  PE BP 113/69   Pulse 65   Ht _0  (1.753 m)   Wt 192 lb (87.1 kg)   BMI 28.35 kg/m    Wt Readings from Last 3 Encounters:  02/07/17 192 lb (87.1 kg)  10/26/16 188 lb 1.6 oz (85.3 kg)  10/10/16 190 lb 1 oz (86.2 kg)   General: Alert, oriented, no distress.  Skin: normal turgor, no rashes, warm and dry HEENT: Normocephalic, atraumatic. Pupils equal round and reactive to light; sclera anicteric; extraocular muscles intact;  Nose without nasal septal hypertrophy Mouth/Parynx benign; Mallinpatti scale 2 Neck: No JVD, no carotid bruits; normal carotid upstroke Lungs: clear to ausculatation and percussion; no wheezing or rales Chest wall: without tenderness to palpitation Heart: PMI not displaced, RRR, s1 s2 normal, 1/6 systolic murmur, no diastolic murmur, no rubs, gallops, thrills, or heaves Abdomen: soft, nontender; no hepatosplenomehaly, BS+; abdominal aorta nontender and not dilated by palpation. Back: no CVA tenderness Pulses 2+ Musculoskeletal: full range of motion, normal strength, no joint deformities Extremities: no clubbing cyanosis or edema, Homan's sign negative  Neurologic: grossly nonfocal; Cranial nerves grossly wnl Psychologic: Normal mood and affect   ECG (independently read by me): Normal sinus rhythm at 65 bpm.  Isolated PAC.  Incomplete right bundle branch block.  Normal intervals.  March 2018 ECG (independently read by me): Normal sinus rhythm at 79 bpm.  QTc interval 460 ms.  PR interval normal at 158 ms.  November 2017 ECG (independently read by me): Normal sinus rhythm at 99 bpm, incomplete right bundle branch block.  Nondiagnostic ST-T changes.  January 2017 ECG (independently read by me):  Normal sinus rhythm at 82 bpm.  Mild RV conduction delay.  Normal intervals.  No significant ST-T changes.  December 2015 ECG (independently read by me): Normal sinus rhythm at 74 bpm.  Mild RV conduction delay.  QTc interval 461 ms.  December 2014 ECG: Sinus rhythm at 57 beats per minute. No ectopy. Normal intervals.  LABS:  BMP Latest Ref Rng & Units 09/25/2016 09/25/2016 08/11/2016  Glucose 65 - 99 mg/dL 95 93 147(H)  BUN 6 - 20 mg/dL 5(L) 5(L) 19  Creatinine 0.61 - 1.24 mg/dL 1.10 1.06 1.03  Sodium 135 - 145 mmol/L 139 137 136  Potassium 3.5 - 5.1 mmol/L 3.7 3.6 4.3  Chloride 101 - 111 mmol/L 100(L) 103 105  CO2 22 - 32 mmol/L - 26 20(L)  Calcium 8.9 - 10.3 mg/dL - 9.2 9.0   Hepatic Function Latest Ref Rng & Units 09/25/2016 07/16/2016 09/16/2012  Total Protein 6.5 - 8.1 g/dL 6.2(L) 6.0(L) 5.9(L)  Albumin 3.5 - 5.0 g/dL 3.7 3.8 3.9  AST 15 - 41 U/L _1 ALT 17 - 63 U/L 20 13 35  Alk Phosphatase 38 - 126 U/L 72 65 48  Total Bilirubin 0.3 - 1.2 mg/dL 1.3(H) 0.7 1.0  Bilirubin, Direct 0.0 - 0.3 mg/dL - - 0.1   CBC Latest Ref Rng & Units 09/25/2016 09/25/2016 08/11/2016  WBC 4.0 - 10.5 K/uL - 7.5 10.5  Hemoglobin 13.0 - 17.0 g/dL 16.7 16.7 18.4(H)  Hematocrit 39.0 - 52.0 % 49.0 47.8 50.4  Platelets 150 - 400 K/uL - 195 182   Lab Results  Component Value Date   MCV 104.8 (H) 09/25/2016   MCV 99.6 08/11/2016   MCV 100.8 (H) 07/16/2016   Erythrocyte Sedimentation Rate     Component Value Date/Time   ESRSEDRATE 1 07/16/2016 0902   CRP November 2017: 139  Repeat CRP 07/16/16:  6  Lab Results  Component Value Date   TSH 2.31 07/16/2016     No results found for: HGBA1C   Lipid Panel     Component Value Date/Time   CHOL 222 (H) 07/16/2016 0902   TRIG 359 (H) 07/16/2016 0902   HDL 67 07/16/2016 0902   CHOLHDL 3.3 07/16/2016 0902   VLDL 72 (H) 07/16/2016 0902   LDLCALC 83 07/16/2016 0902    ------------------------------------------------------------------------------------------------------------- Oxygen Saturation: Rest: 92% with pulse of 93  Following walking in the office: 93% with a pulse of 106  03/21/2016 Nuclear Study Highlights    The left ventricular ejection fraction is normal (55-65%).  Nuclear stress EF: 61%.  The study is normal.  This is a low risk study.  There was no ST segment deviation noted during stress.     ------------------------------------------------------------------- 03/22/2016 ECHO Study Conclusions  - Left ventricle: The cavity size was normal. Wall thickness was   normal. Systolic function was normal. The estimated ejection   fraction was in the range of 60% to 65%. Wall motion was normal;   there were no regional wall motion abnormalities. Doppler   parameters are consistent with abnormal left ventricular   relaxation (grade 1 diastolic dysfunction). - Aortic valve: There was no stenosis. - Aorta: Mildly dilated aortic root. Aortic root dimension: 38 mm   (ED). - Mitral valve: There was no significant regurgitation. - Right ventricle: The cavity size was normal. Systolic function   was normal. - Pulmonary arteries: No complete TR doppler jet so unable to   estimate PA systolic pressure. - Inferior vena cava: The vessel was normal in size. The   respirophasic diameter changes were in the normal range (>= 50%),   consistent with normal central venous pressure.  Impressions:  - Normal LV size with EF 60-65%. Normal RV size and systolic   function. No significant valvular abnormalities.  --------------------------------------------------------------------------------------------  04/02/2016 EXAM: CT CHEST WITHOUT CONTRAST  TECHNIQUE: Multidetector CT imaging of the chest was performed following the standard protocol without IV contrast.  COMPARISON:  None.  FINDINGS: Cardiovascular: The heart size is normal.  No pericardial effusion. Atherosclerotic calcification is noted in the wall of the thoracic aorta.  Mediastinum/Nodes: No mediastinal lymphadenopathy. No evidence for gross hilar lymphadenopathy although assessment is limited by the lack of intravenous contrast on today's study. There is no axillary lymphadenopathy.  Lungs/Pleura: Fine detail obscured by breathing motion. Dependent linear and platelike atelectasis noted in the lower lungs bilaterally. Components of associated parenchymal scarring not excluded. No evidence for pulmonary edema. No dense focal airspace consolidation. No evidence for pleural effusion.  Upper Abdomen: The liver shows diffusely decreased attenuation suggesting steatosis. Gallbladder surgically absent.  Musculoskeletal: Degenerative changes noted in the shoulders bilaterally.  IMPRESSION: Relatively diffuse atelectatic changes in the lungs bilaterally. Study degraded by patient breathing motion.  No specific findings to explain the patient's history of shortness of breath.   06/13/2016 CT Chest High Resolution  FINDINGS: Cardiovascular: Atherosclerotic calcification of the arterial vasculature. Heart size normal. No pericardial effusion.  Mediastinum/Nodes: No pathologically enlarged mediastinal or axillary lymph nodes. Hilar regions are difficult to evaluate without IV contrast. Esophagus is grossly unremarkable.  Lungs/Pleura: Mild basilar predominant subpleural reticulation and ground-glass with minimal architectural distortion. No definite traction bronchiectasis/bronchiolectasis or honeycombing. Findings are likely unchanged from 04/02/2016 but there was a great deal of respiratory motion on that exam. There may be minimal air trapping. No pleural fluid. Airway is unremarkable.  Upper Abdomen: Visualized portions of the  liver, adrenal glands and right kidney are grossly unremarkable. Punctate stone in the left kidney. Visualized  portions of the spleen, pancreas, stomach and bowel are grossly unremarkable. Cholecystectomy. No upper abdominal adenopathy.  Musculoskeletal: No worrisome lytic or sclerotic lesions. Degenerative changes are seen in the spine.  IMPRESSION: 1. Mild basilar predominant subpleural reticulation and ground-glass, suggesting nonspecific interstitial pneumonitis. Usual interstitial pneumonitis cannot be excluded on this baseline examination. 2.  Aortic atherosclerosis (ICD10-170.0). 3. Punctate stone left kidney.   IMPRESSION:  1. PAF (paroxysmal atrial fibrillation) ([place])   2. Essential hypertension   3. Mixed hyperlipidemia   4. Aortic atherosclerosis ([place])   5. Medication management   6. OSA (obstructive sleep apnea)   7. Pneumonitis; resolved   8. Polymyalgia rheumatica (HCC)     ASSESSMENT AND PLAN: Mr. Kosta Schnitzler is a 71 year old Caucasian male who has a remote history of viral myocarditis associated with an ejection fraction of 25% in 1989.  His echo Doppler study in October 2011 showed an ejection fraction greater than 55% with mild TR. He has a history of mitral valve prolapse involving the anterior mitral valve leaflet which was seen on prior echo Doppler studies.  His father also had mitral valve prolapse and is deceased.  Cardiac catheterization by me in 2001 showed mild mid systolic bridging.  He has had chronic T-wave abnormalities on his ECG.  He developed a significant change in symptoms in the fall of 2017 with  progressive increasing shortness of breath with activity.  He also had a low-grade fever and was empirically treated with Levaquin by his primary physician with resolution of the fever, but with continued dyspnea.  He admits to exposure to asbestos when he was in his 16s.  A CT of his chest showed basilar diffuse atelectasis.  There is no evidence for pleural based calcifications.  His last echo Doppler study, shows normal systolic function and only grade 1  diastolic dysfunction, which did explain his significant dyspnea.  In addition, his nuclear perfusion study continues to show normal perfusion without scar or ischemia.  His BMP is normal and are use against a cardiac etiology to his dyspnea.  He had undergone extensive evaluation with [name] and had seen Dr. Charlestine Night before he retired.  His symptom status is now completely resolved and his significant markers of inflammation have now normalized.  He feels well.  He blood pressure remained stable.  He denies significant shortness of breath.  Of recent palpitations and has been tolerating Bystolic but is concerned about the cost.  As result, I will try switching him to metoprolol, tartrate at 12.5 mg twice a day since in the past he may have gotten bradycardic on higher doses of Bystolic.  I also reviewed his lipid studies.  He is developed myalgias with Crestor and Lipitor.  I will add Zetia 10 mg daily.  I reviewed the recent blood work done in [place] from 01/15/2017.  He is maintaining sinus rhythm without recurrent atrial fibrillation.  He is not having any bleeding issues with Pradaxa.  He has sleep apnea on CPAP.  His sleep is stable.  I will see him in 4 months for reevaluation.   Time spent: 30 minutes  Troy Sine, MD, [facility]  02/09/2017 11:23 AM

## 2017-02-16 DIAGNOSIS — G4733 Obstructive sleep apnea (adult) (pediatric): Secondary | ICD-10-CM | POA: Diagnosis not present

## 2017-03-11 ENCOUNTER — Other Ambulatory Visit: Payer: Self-pay | Admitting: Student

## 2017-03-11 ENCOUNTER — Ambulatory Visit: Payer: PPO | Admitting: Neurology

## 2017-03-11 NOTE — Telephone Encounter (Signed)
REFILL 

## 2017-03-12 ENCOUNTER — Telehealth: Payer: Self-pay | Admitting: Cardiovascular Disease

## 2017-03-12 MED ORDER — DILTIAZEM HCL ER COATED BEADS 240 MG PO CP24: 240.0000 mg | ORAL_CAPSULE | Freq: Every day | ORAL | Status: DC

## 2017-03-12 NOTE — Telephone Encounter (Signed)
pharmacy needs to speak to rn about E-script error

## 2017-03-12 NOTE — Telephone Encounter (Signed)
Spoke to pharmacist  Jonni Sanger -- matzim la 240 mg tablet not available from manufacturer okay to switch to diltiazem CD 240 MG  CAPSULES same quantity.  Okay to switch - to capsules dilt CD 240 MG

## 2017-03-12 NOTE — Telephone Encounter (Signed)
New message  Patient states for the last several morning palpitations wake him.  He has been waking up to palpitations. Please call  Patient c/o Palpitations:  High priority if patient c/o lightheadedness, shortness of breath, or chest pain  1) How long have you had palpitations/irregular HR/ Afib? Are you having the symptoms now? NO  2) Are you currently experiencing lightheadedness, SOB or CP? NO  3) Do you have a history of afib (atrial fibrillation) or irregular heart rhythm? YES  4) Have you checked your BP or HR? (document readings if available): 120/67 HR 90  5) Are you experiencing any other symptoms? NO

## 2017-03-12 NOTE — Telephone Encounter (Signed)
Patient states for last 2 morning between 5 am -7 am - he was awaken from sleep  Heart palpitations- no symptoms at present. Patient states he has not had any issue lately until this past 2 mornings  Patient states he has taken diltiazem 240 mg qd and metoprolol 25 mg twice a day . He states he has taken,medication medication today .  aware will defer to DR [facility].

## 2017-03-15 NOTE — Telephone Encounter (Signed)
Called pt as he is upset he has not heard back from his call earlier in the week.  Pt given MD recommendations and pt stated he only wanted to make one medication change at this time.  Pt stated he will start taking his Diltiazem in the evening instead of the morning.  He will let us know if this is not helpful and feels he needs additional medication changes.  No additional questions at this time.

## 2017-03-15 NOTE — Telephone Encounter (Signed)
Please have patient monitor blood pressure.  Would recommend he change taken diltiazem and take this at bedtime.  Will increase metoprolol to 37.5 mg twice a day.  If symptoms persist medicine

## 2017-03-22 DIAGNOSIS — Z23 Encounter for immunization: Secondary | ICD-10-CM | POA: Diagnosis not present

## 2017-04-15 DIAGNOSIS — M5416 Radiculopathy, lumbar region: Secondary | ICD-10-CM | POA: Diagnosis not present

## 2017-04-15 DIAGNOSIS — Z981 Arthrodesis status: Secondary | ICD-10-CM | POA: Diagnosis not present

## 2017-06-06 ENCOUNTER — Other Ambulatory Visit: Payer: Self-pay | Admitting: Cardiovascular Disease

## 2017-06-18 DIAGNOSIS — H2513 Age-related nuclear cataract, bilateral: Secondary | ICD-10-CM | POA: Diagnosis not present

## 2017-06-18 DIAGNOSIS — I10 Essential (primary) hypertension: Secondary | ICD-10-CM | POA: Diagnosis not present

## 2017-06-18 DIAGNOSIS — H25013 Cortical age-related cataract, bilateral: Secondary | ICD-10-CM | POA: Diagnosis not present

## 2017-06-18 DIAGNOSIS — H2511 Age-related nuclear cataract, right eye: Secondary | ICD-10-CM | POA: Diagnosis not present

## 2017-06-18 DIAGNOSIS — H25043 Posterior subcapsular polar age-related cataract, bilateral: Secondary | ICD-10-CM | POA: Diagnosis not present

## 2017-06-19 ENCOUNTER — Telehealth: Payer: Self-pay | Admitting: *Deleted

## 2017-06-19 NOTE — Telephone Encounter (Signed)
   Primary Cardiologist: Shelva Majestic, MD  Chart reviewed as part of pre-operative protocol coverage. Given past medical history and time since last visit, based on ACC/AHA guidelines, STEPHAN NELIS would be at acceptable risk for the planned procedure without further cardiovascular testing.   I will route this recommendation to the requesting party via Epic fax function and remove from pre-op pool.  Per the pharmacy: "We generally recommend against holding [pradaxa] for cataract surgery. Most of the surgeons are comfortable doing the procedure on anticoagulation.   Please call with questions.  Preop call back pool, please call office and make sure they received this documentation.  Tami Lin Salam [place], PA 06/19/2017, 3:58 PM

## 2017-06-19 NOTE — Telephone Encounter (Signed)
We generally recommend against holding for cataract surgery. Most of the surgeons are comfortable doing the procedure on anticoagulation.

## 2017-06-19 NOTE — Telephone Encounter (Signed)
   [facility] Pre-operative Risk Assessment    Request for surgical clearance:  1. What type of surgery is being performed? CATARACT EXTRACTION W/INTRAOCULAR LENS IMPLANTATION OF THE RIGHT EYE FOLLOWED BY THE LEFT EYE   2. When is this surgery scheduled? TBD   3. What type of clearance is required (medical clearance vs. Pharmacy clearance to hold med vs. Both)? BOTH  4. Are there any medications that need to be held prior to surgery and how long?   5. Practice name and name of physician performing surgery? [facility] DR TIMOTHY BEVIS   6. What is your office phone and fax number? PHONE [phone] FAX [phone]   7. Anesthesia type (None, local, MAC, general) ? TOPICAL ANESTHESIA W/IV MEDICATION

## 2017-06-19 NOTE — Telephone Encounter (Signed)
   Primary Cardiologist: Shelva Majestic, MD  Chart reviewed as part of pre-operative protocol coverage. Given past medical history and time since last visit, based on ACC/AHA guidelines, DRAY DENTE would be at acceptable risk for the planned procedure without further cardiovascular testing.   I will route this to pharmacy for questions regarding pradaxa.  Tami Lin Dominque Marlin, PA 06/19/2017, 3:49 PM

## 2017-06-20 NOTE — Telephone Encounter (Signed)
[facility] and notified staff that clearance was sent. The answering party states the person who works with Dr. Talbert Forest is gone for the day. Advised to call back if clearance info was not received.

## 2017-07-18 DIAGNOSIS — Z125 Encounter for screening for malignant neoplasm of prostate: Secondary | ICD-10-CM | POA: Diagnosis not present

## 2017-07-18 DIAGNOSIS — I10 Essential (primary) hypertension: Secondary | ICD-10-CM | POA: Diagnosis not present

## 2017-07-18 DIAGNOSIS — R7301 Impaired fasting glucose: Secondary | ICD-10-CM | POA: Diagnosis not present

## 2017-07-18 DIAGNOSIS — E782 Mixed hyperlipidemia: Secondary | ICD-10-CM | POA: Diagnosis not present

## 2017-07-18 DIAGNOSIS — R945 Abnormal results of liver function studies: Secondary | ICD-10-CM | POA: Diagnosis not present

## 2017-07-22 DIAGNOSIS — R7301 Impaired fasting glucose: Secondary | ICD-10-CM | POA: Diagnosis not present

## 2017-07-22 DIAGNOSIS — E782 Mixed hyperlipidemia: Secondary | ICD-10-CM | POA: Diagnosis not present

## 2017-07-22 DIAGNOSIS — Z6828 Body mass index (BMI) 28.0-28.9, adult: Secondary | ICD-10-CM | POA: Diagnosis not present

## 2017-07-22 DIAGNOSIS — N400 Enlarged prostate without lower urinary tract symptoms: Secondary | ICD-10-CM | POA: Diagnosis not present

## 2017-07-22 DIAGNOSIS — Z0000 Encounter for general adult medical examination without abnormal findings: Secondary | ICD-10-CM | POA: Diagnosis not present

## 2017-07-22 DIAGNOSIS — R0981 Nasal congestion: Secondary | ICD-10-CM | POA: Diagnosis not present

## 2017-07-22 DIAGNOSIS — T7840XA Allergy, unspecified, initial encounter: Secondary | ICD-10-CM | POA: Diagnosis not present

## 2017-07-22 DIAGNOSIS — I482 Chronic atrial fibrillation: Secondary | ICD-10-CM | POA: Diagnosis not present

## 2017-07-22 DIAGNOSIS — G4730 Sleep apnea, unspecified: Secondary | ICD-10-CM | POA: Diagnosis not present

## 2017-07-22 DIAGNOSIS — M353 Polymyalgia rheumatica: Secondary | ICD-10-CM | POA: Diagnosis not present

## 2017-07-22 DIAGNOSIS — I10 Essential (primary) hypertension: Secondary | ICD-10-CM | POA: Diagnosis not present

## 2017-08-12 DIAGNOSIS — H2511 Age-related nuclear cataract, right eye: Secondary | ICD-10-CM | POA: Diagnosis not present

## 2017-08-13 DIAGNOSIS — H2512 Age-related nuclear cataract, left eye: Secondary | ICD-10-CM | POA: Diagnosis not present

## 2017-08-19 ENCOUNTER — Other Ambulatory Visit (INDEPENDENT_AMBULATORY_CARE_PROVIDER_SITE_OTHER): Payer: Self-pay | Admitting: Internal Medicine

## 2017-08-26 DIAGNOSIS — H2512 Age-related nuclear cataract, left eye: Secondary | ICD-10-CM | POA: Diagnosis not present

## 2017-10-08 ENCOUNTER — Other Ambulatory Visit: Payer: Self-pay | Admitting: Cardiovascular Disease

## 2017-10-08 NOTE — Telephone Encounter (Signed)
Rx sent to pharmacy   

## 2017-10-21 DIAGNOSIS — Z981 Arthrodesis status: Secondary | ICD-10-CM | POA: Diagnosis not present

## 2017-10-21 DIAGNOSIS — M5416 Radiculopathy, lumbar region: Secondary | ICD-10-CM | POA: Diagnosis not present

## 2017-10-21 DIAGNOSIS — M5116 Intervertebral disc disorders with radiculopathy, lumbar region: Secondary | ICD-10-CM | POA: Diagnosis not present

## 2017-11-18 ENCOUNTER — Other Ambulatory Visit: Payer: Self-pay | Admitting: Cardiovascular Disease

## 2017-11-18 NOTE — Telephone Encounter (Signed)
Rx sent to pharmacy   

## 2017-11-25 ENCOUNTER — Other Ambulatory Visit (INDEPENDENT_AMBULATORY_CARE_PROVIDER_SITE_OTHER): Payer: Self-pay | Admitting: Internal Medicine

## 2017-11-26 ENCOUNTER — Other Ambulatory Visit (HOSPITAL_COMMUNITY): Payer: Self-pay | Admitting: Adult Health Nurse Practitioner

## 2017-11-26 ENCOUNTER — Ambulatory Visit (HOSPITAL_COMMUNITY)
Admission: RE | Admit: 2017-11-26 | Discharge: 2017-11-26 | Disposition: A | Discharge status: Home / Self Care | Payer: PPO | Source: Ambulatory Visit | Attending: Adult Health Nurse Practitioner | Admitting: Adult Health Nurse Practitioner

## 2017-11-26 DIAGNOSIS — Z6826 Body mass index (BMI) 26.0-26.9, adult: Secondary | ICD-10-CM | POA: Diagnosis not present

## 2017-11-26 DIAGNOSIS — I482 Chronic atrial fibrillation: Secondary | ICD-10-CM | POA: Diagnosis not present

## 2017-11-26 DIAGNOSIS — I10 Essential (primary) hypertension: Secondary | ICD-10-CM | POA: Diagnosis not present

## 2017-11-26 DIAGNOSIS — G4730 Sleep apnea, unspecified: Secondary | ICD-10-CM | POA: Diagnosis not present

## 2017-11-26 DIAGNOSIS — M353 Polymyalgia rheumatica: Secondary | ICD-10-CM | POA: Diagnosis not present

## 2017-11-26 DIAGNOSIS — Z23 Encounter for immunization: Secondary | ICD-10-CM | POA: Diagnosis not present

## 2017-11-26 DIAGNOSIS — R0602 Shortness of breath: Secondary | ICD-10-CM | POA: Diagnosis not present

## 2017-11-26 DIAGNOSIS — J849 Interstitial pulmonary disease, unspecified: Secondary | ICD-10-CM | POA: Diagnosis not present

## 2017-11-26 DIAGNOSIS — Z6828 Body mass index (BMI) 28.0-28.9, adult: Secondary | ICD-10-CM | POA: Diagnosis not present

## 2017-11-26 DIAGNOSIS — R7301 Impaired fasting glucose: Secondary | ICD-10-CM | POA: Diagnosis not present

## 2017-11-26 DIAGNOSIS — T7840XA Allergy, unspecified, initial encounter: Secondary | ICD-10-CM | POA: Diagnosis not present

## 2017-11-26 DIAGNOSIS — Z0000 Encounter for general adult medical examination without abnormal findings: Secondary | ICD-10-CM | POA: Diagnosis not present

## 2017-11-26 DIAGNOSIS — E782 Mixed hyperlipidemia: Secondary | ICD-10-CM | POA: Diagnosis not present

## 2017-11-26 DIAGNOSIS — N400 Enlarged prostate without lower urinary tract symptoms: Secondary | ICD-10-CM | POA: Diagnosis not present

## 2017-11-26 DIAGNOSIS — R0981 Nasal congestion: Secondary | ICD-10-CM | POA: Diagnosis not present

## 2017-12-02 ENCOUNTER — Other Ambulatory Visit: Payer: Self-pay | Admitting: Internal Medicine

## 2017-12-02 DIAGNOSIS — R0602 Shortness of breath: Secondary | ICD-10-CM

## 2017-12-02 DIAGNOSIS — J849 Interstitial pulmonary disease, unspecified: Secondary | ICD-10-CM

## 2017-12-02 DIAGNOSIS — I10 Essential (primary) hypertension: Secondary | ICD-10-CM | POA: Diagnosis not present

## 2017-12-02 DIAGNOSIS — E782 Mixed hyperlipidemia: Secondary | ICD-10-CM | POA: Diagnosis not present

## 2017-12-02 DIAGNOSIS — I482 Chronic atrial fibrillation: Secondary | ICD-10-CM | POA: Diagnosis not present

## 2017-12-06 ENCOUNTER — Ambulatory Visit (HOSPITAL_COMMUNITY)
Admission: RE | Admit: 2017-12-06 | Discharge: 2017-12-06 | Disposition: A | Discharge status: Home / Self Care | Payer: PPO | Source: Ambulatory Visit | Attending: Internal Medicine | Admitting: Internal Medicine

## 2017-12-06 DIAGNOSIS — J189 Pneumonia, unspecified organism: Secondary | ICD-10-CM | POA: Diagnosis not present

## 2017-12-06 DIAGNOSIS — I700 Atherosclerosis of aorta: Secondary | ICD-10-CM | POA: Diagnosis not present

## 2017-12-06 DIAGNOSIS — R0602 Shortness of breath: Secondary | ICD-10-CM | POA: Insufficient documentation

## 2017-12-06 DIAGNOSIS — J849 Interstitial pulmonary disease, unspecified: Secondary | ICD-10-CM | POA: Insufficient documentation

## 2017-12-09 ENCOUNTER — Telehealth: Payer: Self-pay | Admitting: Cardiovascular Disease

## 2017-12-09 ENCOUNTER — Telehealth: Payer: Self-pay | Admitting: *Deleted

## 2017-12-09 MED ORDER — APIXABAN 5 MG PO TABS: 5.0000 mg | ORAL_TABLET | Freq: Two times a day (BID) | ORAL | 0 refills | Status: DC

## 2017-12-09 NOTE — Telephone Encounter (Addendum)
New message     Pt c/o medication issue:  1. Name of Medication: dabigatran (PRADAXA) 150 MG CAPS capsule  2. How are you currently taking this medication (dosage and times per day)? Take 1 capsule (150 mg total) by mouth 2 (two) times daily. Need OV for further refills.  3. Are you having a reaction (difficulty breathing--STAT)? NO  4. What is your medication issue? Ovid Curd from Dr [facility]row office calling to request Pradaxa be changed to Apixaban because of the cost difference. Please call Ovid Curd at [phone]

## 2017-12-09 NOTE — Telephone Encounter (Signed)
[name] called to verify that the patient is now on Eliquis 5 mg bid.   Spoke with Ovid Curd at Dr. [facility] office.  Pt has been seen recently there and needs to switch Pradaxa to Eliquis due to cost concerns.  BMET drawn at that office on 7/18 showed SCr of 0.78.  They have verbalized their understanding.

## 2017-12-09 NOTE — Telephone Encounter (Signed)
Spoke with Ovid Curd at Dr. [facility] Burrow office.  Pt has been seen recently there and needs to switch Pradaxa to Eliquis due to cost concerns.  BMET drawn at that office on 7/18 showed SCr of 0.78.  Based on this information switch patient to Eliquis 5 mg twice daily, with the first dose 12 hours after last dose of Pradaxa.  Explained to Ovid Curd that patient is past due to see Dr. Claiborne Billings.  They will contact him with medication change information and stress need to schedule MD appt here.

## 2017-12-10 DIAGNOSIS — Z6826 Body mass index (BMI) 26.0-26.9, adult: Secondary | ICD-10-CM | POA: Diagnosis not present

## 2017-12-10 DIAGNOSIS — R0602 Shortness of breath: Secondary | ICD-10-CM | POA: Diagnosis not present

## 2017-12-16 ENCOUNTER — Telehealth: Payer: Self-pay | Admitting: *Deleted

## 2017-12-16 ENCOUNTER — Encounter: Payer: Self-pay | Admitting: Nurse Practitioner

## 2017-12-16 ENCOUNTER — Ambulatory Visit: Payer: PPO | Admitting: Nurse Practitioner

## 2017-12-16 ENCOUNTER — Other Ambulatory Visit (INDEPENDENT_AMBULATORY_CARE_PROVIDER_SITE_OTHER): Payer: PPO

## 2017-12-16 ENCOUNTER — Ambulatory Visit (INDEPENDENT_AMBULATORY_CARE_PROVIDER_SITE_OTHER): Payer: PPO | Admitting: *Deleted

## 2017-12-16 VITALS — BP 106/60 | HR 74 | Ht 69.0 in | Wt 180.0 lb

## 2017-12-16 DIAGNOSIS — R0602 Shortness of breath: Secondary | ICD-10-CM

## 2017-12-16 DIAGNOSIS — J849 Interstitial pulmonary disease, unspecified: Secondary | ICD-10-CM

## 2017-12-16 LAB — BASIC METABOLIC PANEL
BUN: 11 mg/dL (ref 6–23)
CO2: 25 mEq/L (ref 19–32)
Calcium: 9 mg/dL (ref 8.4–10.5)
Chloride: 100 mEq/L (ref 96–112)
Creatinine, Ser: 0.88 mg/dL (ref 0.40–1.50)
GFR: 90.91 mL/min (ref >60.00)
Glucose, Bld: 214 mg/dL — ABNORMAL HIGH (ref 70–99)
Potassium: 3.9 mEq/L (ref 3.5–5.1)
Sodium: 137 mEq/L (ref 135–145)

## 2017-12-16 LAB — CBC WITH DIFFERENTIAL/PLATELET
Basophils Absolute: 0.1 10*3/uL (ref 0.0–0.1)
Basophils Relative: 1.1 % (ref 0.0–3.0)
Eosinophils Absolute: 0.1 10*3/uL (ref 0.0–0.7)
Eosinophils Relative: 0.7 % (ref 0.0–5.0)
HCT: 45.2 % (ref 39.0–52.0)
Hemoglobin: 15.9 g/dL (ref 13.0–17.0)
Lymphocytes Relative: 14.8 % (ref 12.0–46.0)
Lymphs Abs: 1.7 10*3/uL (ref 0.7–4.0)
MCHC: 35.1 g/dL (ref 30.0–36.0)
MCV: 103.4 fl — ABNORMAL HIGH (ref 78.0–100.0)
Monocytes Absolute: 0.6 10*3/uL (ref 0.1–1.0)
Monocytes Relative: 5.3 % (ref 3.0–12.0)
Neutro Abs: 8.9 10*3/uL — ABNORMAL HIGH (ref 1.4–7.7)
Neutrophils Relative %: 78.1 % — ABNORMAL HIGH (ref 43.0–77.0)
Platelets: 216 10*3/uL (ref 150.0–400.0)
RBC: 4.37 Mil/uL (ref 4.22–5.81)
RDW: 14 % (ref 11.5–15.5)
WBC: 11.4 10*3/uL — ABNORMAL HIGH (ref 4.0–10.5)

## 2017-12-16 MED ORDER — PREDNISONE 10 MG PO TABS: ORAL_TABLET | ORAL | 0 refills | Status: DC

## 2017-12-16 NOTE — Progress Notes (Signed)
SIX MIN WALK 12/16/2017 10/26/2016 05/01/2016  Medications All of his medications per the pt Bystolic 2.5mg  and pradaxa 150 taken at 8am -  Supplimental Oxygen during Test? (L/min) No No -  Laps 10 8 -  Partial Lap (in Meters) 0 0 -  Baseline BP (sitting) 120/80 130/70 -  Baseline Heartrate 84 81 -  Baseline Dyspnea (Borg Scale) 4 0.5 -  Baseline Fatigue (Borg Scale) 4 0.5 -  Baseline SPO2 95 96 -  BP (sitting) 130/90 138/76 -  Heartrate 88 84 -  Dyspnea (Borg Scale) 8 2 -  Fatigue (Borg Scale) 9 1 -  SPO2 94 99 -  BP (sitting) 116/74 136/72 -  Heartrate 72 77 -  SPO2 97 100 -  Stopped or Paused before Six Minutes No No -  Distance Completed 480 384 -  Tech Comments: - pt completed test at moderate pace with no complaints or desats.  Patient was able to attempt all 3 laps around office. Patient walked at a brisk pace. No complaints of shortness of breath or chest pain.

## 2017-12-16 NOTE — Patient Instructions (Addendum)
Will order 6 minute walk today CBC, BMP today Will order prednisone taper and advise patient to continue 10 mg prednisone daily until 3 week follow up with [name] Will order Follow up PFT - will discuss results at follow up visit May continue rescue inhaler

## 2017-12-16 NOTE — Assessment & Plan Note (Signed)
Will order 6 minute walk today CBC, BMP today - will call with results Will order prednisone taper and advise patient to continue 10 mg prednisone daily until 3 week follow up with [name] Will order Follow up PFT - will discuss results at follow up visit May continue rescue inhaler

## 2017-12-16 NOTE — Telephone Encounter (Signed)
Letter given to [facility] RN

## 2017-12-16 NOTE — Telephone Encounter (Signed)
patient walked in and wanted to give some information to dr Claiborne Billings and /or nurse.

## 2017-12-16 NOTE — Telephone Encounter (Signed)
Blood pressure and heart rate data reviewed.  There is variability with some blood pressures being low at 81 and 86 others being elevated.  Heart rate tends to be increased in the 90s to low 100s.  He may need titration of beta-blocker regimen but I am hesitant to do this without evaluation periods of low blood pressure.  May be worthwhile to see if he can be evaluated by APP this week in the office since I do not have any other office days the next 2 weeks.  With the regularity to his heart rate will also need ECG.

## 2017-12-16 NOTE — Progress Notes (Signed)
@Patient  ID: [name], male    DOB: 07/19/1945, 72 y.o.   MRN: [id number]  Chief Complaint  Patient presents with  . Follow-up    Shortness of Breath.Took Prednisone and it did help, but got worse once off the dosage    Referring provider: [name], MD  72 year old patient with history of former smoker,  ILD and OSA followed by [name]  HPI: Patient complains today of ongoing shortness of breath. States that he can not tolerate any activity. Has recently seen PCP and was diagnosed with PNA after chest x-ray and Ct on 12-07-17. He has completed a course of Levaquin and prednisone taper. He states that he did feel a little better while taking Levaquin, but SOB immediately returned after therapy was complete. He was also prescribed albuterol rescue inhaler which he uses Q6 hours.   Shortness of Breath  This is a new problem. The current episode started 1 to 4 weeks ago. The problem occurs daily. The problem has been unchanged. Pertinent negatives include no chest pain, fever, headaches, hemoptysis, rash, rhinorrhea, sore throat, sputum production or syncope. The symptoms are aggravated by any activity. He has tried rest (completed Levaquin, prednisone taper, and rescue inhaler) for the symptoms. The treatment provided no relief. (ILD, OSA)   Note: Patient states that he has past history of asbestos exposure and exposure to dust and chemicals while working in [employer].   Patient has not used CPAP  Recent [facility] Encounters:   10-26-17 OV  Plan: ILD (interstitial lung disease) ([place]) ?NSIP /viral Pnuemonitis  He is clinically improved and PFT is normal and improved  . 6 min walk test normal .  Will continue to follow  Consider repeat ct chest at 1 yr follow up .      Tests:  10-26-17 - Pulmonary function test: FEV1 at 102%, ratio 81, FVC 93%, total lung capacity 98%. DLCO 84%., No significant bronchodilator response. 10-26-17 - 6 minute walk test  with no desaturations on room air. Total distance 384 m  SIX MIN WALK 12/16/2017 10/26/2016 05/01/2016  Medications All of his medications per the pt Bystolic 2.5mg  and pradaxa 150 taken at 8am -  Supplimental Oxygen during Test? (L/min) No No -  Laps 10 8 -  Partial Lap (in Meters) 0 0 -  Baseline BP (sitting) 120/80 130/70 -  Baseline Heartrate 84 81 -  Baseline Dyspnea (Borg Scale) 4 0.5 -  Baseline Fatigue (Borg Scale) 4 0.5 -  Baseline SPO2 95 96 -  BP (sitting) 130/90 138/76 -  Heartrate 88 84 -  Dyspnea (Borg Scale) 8 2 -  Fatigue (Borg Scale) 9 1 -  SPO2 94 99 -  BP (sitting) 116/74 136/72 -  Heartrate 72 77 -  SPO2 97 100 -  Stopped or Paused before Six Minutes No No -  Distance Completed 480 384 -  Tech Comments: - pt completed test at moderate pace with no complaints or desats.  Patient was able to attempt all 3 laps around office. Patient walked at a brisk pace. No complaints of shortness of breath or chest pain.       Allergies  Allergen Reactions  . Lidocaine Hcl Anaphylaxis    Xylocaine  . Xylocaine [Lidocaine] Anaphylaxis  . Codeine Nausea And Vomiting  . Crestor [Rosuvastatin Calcium] Other (See Comments)    All-over body aches  . Percocet [Oxycodone-Acetaminophen] Itching  . Statins Other (See Comments)    Muscle soreness and  an aching feeling all over    Immunization History  Administered Date(s) Administered  . Influenza Split 03/20/2016    Past Medical History:  Diagnosis Date  . Arthritis   . Chest pain    a. 03/2017: echo showing EF of 60-65%, no regional WMA or significant valve abnormalities. b. 03/2016: NST with no evidence of ischemia.   . Digestive problems    on Prednisone prn for this issue- diarrhea  . Dysrhythmia    irregular due to Myocarditis- takes Atenolol, Norvasc  . GERD (gastroesophageal reflux disease)   . H/O viral myocarditis    25 years ago  . Head injury, closed, with concussion    Breif LOC  . Hyperlipidemia   .  Mild mitral valve prolapse    per Dr Evette Georges notes  . PAF (paroxysmal atrial fibrillation) ([place])    a. initially occuring in 02/2016. b. recurrent in 07/2016. Placed on Eliquis  . Polymyalgia rheumatica (HCC)     Tobacco History: Social History   Tobacco Use  Smoking Status Former Smoker  . Packs/day: 0.02  . Years: 1.00  . Pack years: 0.02  . Types: Cigarettes  . Last attempt to quit: 07/10/1971  . Years since quitting: 46.4  Smokeless Tobacco Never Used  Tobacco Comment   Patient smoked 1/2 pack per week   Counseling given: Former smoker Comment: Patient smoked 1/2 pack per week   Outpatient Encounter Medications as of 12/16/2017  Medication Sig  . acetaminophen (TYLENOL) 500 MG tablet Take 500 mg by mouth every 6 (six) hours as needed for mild pain.   Marland Kitchen albuterol (PROVENTIL HFA;VENTOLIN HFA) 108 (90 Base) MCG/ACT inhaler   . apixaban (ELIQUIS) 5 MG TABS tablet Take 1 tablet (5 mg total) by mouth 2 (two) times daily. Must schedule Dr. Claiborne Billings appt for further refills  . CARTIA XT 240 MG 24 hr capsule TAKE ONE CAPSULE ONCE DAILY  . cetirizine (ZYRTEC) 10 MG tablet Take 10 mg by mouth daily as needed for allergies.   . diphenoxylate-atropine (LOMOTIL) 2.5-0.025 MG tablet TAKE TWO (2) TABLETS BY MOUTH EVERY MORNING  . Methylsulfonylmethane (MSM) 1000 MG CAPS Take by mouth.  . Multiple Vitamins-Minerals (CENTRUM SILVER 50+MEN PO) Take 1 tablet by mouth daily.  . Omega-3 Fatty Acids (FISH OIL) 1000 MG CAPS take 2 capsules daily (Patient taking differently: Take 1,000 mg by mouth 2 (two) times daily. )  . ondansetron (ZOFRAN) 4 MG tablet Take 4 mg by mouth every 8 (eight) hours as needed for nausea or vomiting.  . tamsulosin (FLOMAX) 0.4 MG CAPS capsule Take 0.4 mg by mouth at bedtime.   . [DISCONTINUED] predniSONE (DELTASONE) 10 MG tablet Take 10 mg by mouth daily with breakfast.  . ezetimibe (ZETIA) 10 MG tablet Take 1 tablet (10 mg total) by mouth daily.  . metoprolol tartrate  (LOPRESSOR) 25 MG tablet Take 1 tablet (25 mg total) by mouth 2 (two) times daily.  . predniSONE (DELTASONE) 10 MG tablet Take 4 tabs PO daily x4 days, then take 3 tabs daily x4 days, then take 2 tabs daily x4 days, then take 1 tab daily   No facility-administered encounter medications on file as of 12/16/2017.      Review of Systems  Review of Systems  Constitutional: Positive for activity change. Negative for appetite change, chills and fever.  HENT: Negative for congestion, postnasal drip, rhinorrhea, sinus pain and sore throat.   Respiratory: Positive for shortness of breath. Negative for hemoptysis and sputum production.  Cardiovascular: Negative for chest pain and syncope.  Skin: Negative for rash.  Neurological: Negative for headaches.  Psychiatric/Behavioral: Negative.        Physical Exam  BP 106/60 (BP Location: Left Arm, Patient Position: Sitting, Cuff Size: Normal)   Pulse 74   Ht 5\' 9"  (1.753 m)   Wt 180 lb (81.6 kg)   SpO2 94%   BMI 26.58 kg/m   Wt Readings from Last 5 Encounters:  12/16/17 180 lb (81.6 kg)  02/07/17 192 lb (87.1 kg)  10/26/16 188 lb 1.6 oz (85.3 kg)  10/10/16 190 lb 1 oz (86.2 kg)  08/14/16 189 lb 9.6 oz (86 kg)     Physical Exam  Constitutional: He is oriented to person, place, and time. He appears well-developed and well-nourished. No distress.  Cardiovascular: Normal rate and regular rhythm.  Pulmonary/Chest: Effort normal and breath sounds normal.  Neurological: He is alert and oriented to person, place, and time.  Skin: Skin is warm and dry.  Psychiatric: He has a normal mood and affect.  Nursing note and vitals reviewed.    Lab Results:  CBC    Component Value Date/Time   WBC 7.5 09/25/2016 1707   RBC 4.56 09/25/2016 1707   HGB 16.7 09/25/2016 1713   HCT 49.0 09/25/2016 1713   PLT 195 09/25/2016 1707   MCV 104.8 (H) 09/25/2016 1707   MCH 36.6 (H) 09/25/2016 1707   MCHC 34.9 09/25/2016 1707   RDW 15.1 09/25/2016 1707     LYMPHSABS 2.8 09/25/2016 1707   MONOABS 0.6 09/25/2016 1707   EOSABS 0.0 09/25/2016 1707   BASOSABS 0.0 09/25/2016 1707    BMET    Component Value Date/Time   NA 139 09/25/2016 1713   K 3.7 09/25/2016 1713   CL 100 (L) 09/25/2016 1713   CO2 26 09/25/2016 1707   GLUCOSE 95 09/25/2016 1713   BUN 5 (L) 09/25/2016 1713   CREATININE 1.10 09/25/2016 1713   CREATININE 0.95 07/16/2016 0902   CALCIUM 9.2 09/25/2016 1707   GFRNONAA >60 09/25/2016 1707   GFRAA >60 09/25/2016 1707    BNP    Component Value Date/Time   BNP 53.0 04/02/2016 1435    ProBNP No results found for: PROBNP  Imaging: Dg Chest 2 View  Result Date: 11/26/2017 CLINICAL DATA:  2-3 week history of shortness of breath with exertion. Current history of mitral valve prolapse. EXAM: CHEST - 2 VIEW COMPARISON:  08/11/2016, 04/18/2016 and earlier, including high-resolution CT chest 06/13/2016. FINDINGS: Cardiac silhouette normal in size, unchanged. Thoracic aorta mildly atherosclerotic, unchanged. Basilar predominant interstitial lung disease, progressive since the prior high-resolution CT. Lungs otherwise clear. No localized airspace consolidation. No pleural effusions. No pneumothorax. Normal pulmonary vascularity. Degenerative changes involving the thoracic spine. IMPRESSION: Progressive basilar predominant interstitial lung disease since a prior high-resolution CT 06/13/2016. This is likely due to progressive nonspecific interstitial pneumonitis (or usual interstitial pneumonitis). A follow-up high-resolution CT of the chest which can be compared to the prior CT may be confirmatory. Electronically Signed   By: Evangeline Dakin M.D.   On: 11/26/2017 17:32   Ct Chest Wo Contrast  Result Date: 12/07/2017 CLINICAL DATA:  Clinical diagnosis of pneumonia. EXAM: CT CHEST WITHOUT CONTRAST TECHNIQUE: Multidetector CT imaging of the chest was performed following the standard protocol without IV contrast. COMPARISON:  Chest  radiograph 11/26/2017, chest CT 06/13/2016 FINDINGS: Cardiovascular: Normal heart size. No pericardial effusion. Mild calcific atherosclerotic disease of the aorta and coronary arteries. Mediastinum/Nodes: No enlarged mediastinal or axillary  lymph nodes. Thyroid gland, trachea, and esophagus demonstrate no significant findings. Lungs/Pleura: Mild bilateral patchy ground-glass opacities and subpleural reticulation, with lower lobe predominance. No evidence of bronchiectasis, or honeycombing. Upper Abdomen: No acute abnormality.  Postcholecystectomy. Musculoskeletal: No chest wall mass or suspicious bone lesions identified. Spondylosis of the visualized spine. IMPRESSION: Mild bilateral patchy ground-glass opacities and subpleural reticulation, with lower lobe predominance. Nonspecific findings which may be seen with atypical pneumonia, or interstitial pneumonitis. Aortic Atherosclerosis (ICD10-I70.0). Electronically Signed   By: Fidela Salisbury M.D.   On: 12/07/2017 07:43     Assessment & Plan:   Shortness of breath  ILD (interstitial lung disease) ([place]) - Plan: Pulmonary function test, 6 minute walk, predniSONE (DELTASONE) 10 MG tablet, CBC w/Diff, Basic Metabolic Panel (BMET)    ILD (interstitial lung disease) ([place]) Will order 6 minute walk today CBC, BMP today - will call with results Will order prednisone taper and advise patient to continue 10 mg prednisone daily until 3 week follow up with [name] Will order Follow up PFT - will discuss results at follow up visit May continue rescue inhaler      Fenton Foy, NP 12/16/2017

## 2017-12-17 NOTE — Telephone Encounter (Signed)
Spoke to patient, appt made for 8/8 at 1:30 PM with [name] PA.

## 2017-12-17 NOTE — Telephone Encounter (Signed)
Thank you :)

## 2017-12-19 ENCOUNTER — Encounter: Payer: Self-pay | Admitting: Physician Assistant

## 2017-12-19 ENCOUNTER — Ambulatory Visit (INDEPENDENT_AMBULATORY_CARE_PROVIDER_SITE_OTHER): Payer: PPO | Admitting: Physician Assistant

## 2017-12-19 VITALS — BP 122/62 | HR 92 | Ht 69.0 in | Wt 183.0 lb

## 2017-12-19 DIAGNOSIS — E785 Hyperlipidemia, unspecified: Secondary | ICD-10-CM

## 2017-12-19 DIAGNOSIS — Z9989 Dependence on other enabling machines and devices: Secondary | ICD-10-CM | POA: Diagnosis not present

## 2017-12-19 DIAGNOSIS — I480 Paroxysmal atrial fibrillation: Secondary | ICD-10-CM | POA: Diagnosis not present

## 2017-12-19 DIAGNOSIS — G4733 Obstructive sleep apnea (adult) (pediatric): Secondary | ICD-10-CM

## 2017-12-19 DIAGNOSIS — I10 Essential (primary) hypertension: Secondary | ICD-10-CM | POA: Diagnosis not present

## 2017-12-19 DIAGNOSIS — B3322 Viral myocarditis: Secondary | ICD-10-CM

## 2017-12-19 NOTE — Progress Notes (Signed)
Cardiology Office Note    Date:  12/21/2017   ID:  [name], DOB Aug 04, 1945, MRN [id number]  PCP:  [name], MD  Cardiologist:  Dr. Claiborne Billings  Chief Complaint  Patient presents with  . Follow-up    seen for Dr. Claiborne Billings.     History of Present Illness:  [name] is a 72 y.o. male with PMH of viral myocarditis, hypertension, hyperlipidemia, polymyalgia rheumatica, paroxysmal atrial fibrillation, ILD and OSA on CPAP.  He suffered an episode of viral myocarditis in 1989, his ejection for restriction was 25% to time which normalized on subsequent check.  He also had a history of mitral valve prolapse.  Cardiac catheterization in 2001 showed only mild mid systolic LAD bridging.  He has been intolerant of Lipitor and Crestor due to myalgia.  In 2017, he developed new atrial fibrillation while vacationing at [facility].  I added diltiazem on top of his atenolol.  He was also placed on Xarelto at the time.  Echocardiogram in November 2017 showed EF 60 to 65%, grade 1 DD, mildly dilated aortic root measuring 38 mm.  Myoview showed normal EF without ischemia.  He is being followed by pulmonology service for interstitial show lung disease.  Most recent CT of the chest obtained on 12/06/2017 showed mild bilateral patchy groundglass opacities and subpleural reticulation, aortic atherosclerosis.  He was seen by pulmonology service on 12/16/2017 and was given a steroid taper for shortness of breath.  His systemic anticoagulation was recently switched to Eliquis due to cost concern.  Patient presents today for cardiology office visit.  After she was started on the steroid therapy, he has been noticing rare episode of dizziness.  He also noticed his heart rate would suddenly go up.  Based on the blood pressure diary he obtained, on August 2, he had a blood pressure reading of 86/69 with a heart rate of 159.  I suspect he is going in and out of atrial fibrillation at this point.  We talked about heart  monitor versus AliveCor.  He wished to consider AliveCor for long-term monitoring of recurrent atrial fibrillation.  I suspect once his pneumonitis improve and that he is off of steroid therapy, his blood pressure and heart rate should normalize.  I did not recommend increase his dose of metoprolol or diltiazem.    Past Medical History:  Diagnosis Date  . Arthritis   . Chest pain    a. 03/2017: echo showing EF of 60-65%, no regional WMA or significant valve abnormalities. b. 03/2016: NST with no evidence of ischemia.   . Digestive problems    on Prednisone prn for this issue- diarrhea  . Dysrhythmia    irregular due to Myocarditis- takes Atenolol, Norvasc  . GERD (gastroesophageal reflux disease)   . H/O viral myocarditis    25 years ago  . Head injury, closed, with concussion    Breif LOC  . Hyperlipidemia   . Mild mitral valve prolapse    per Dr Evette Georges notes  . PAF (paroxysmal atrial fibrillation) ([place])    a. initially occuring in 02/2016. b. recurrent in 07/2016. Placed on Eliquis  . Polymyalgia rheumatica (HCC)     Past Surgical History:  Procedure Laterality Date  . apendectomy  1965  . APPENDECTOMY    . bone spur Bilateral 1999   feet  . CARDIAC CATHETERIZATION  2001  . CHOLECYSTECTOMY  2004  . COLONOSCOPY    . LUMBAR LAMINECTOMY/ DECOMPRESSION WITH MET-RX Right 05/05/2013  Procedure: Right Lumbar three-four Extraforaminal Microdiskectomy with Metrex;  Surgeon: [name], MD;  Location: [place] [facility];  Service: Neurosurgery;  Laterality: Right;  Right Lumbar three-four Extraforaminal Microdiskectomy with Metrex  . SHOULDER OPEN ROTATOR CUFF REPAIR Bilateral 2001  . TONSILLECTOMY      Current Medications: Outpatient Medications Prior to Visit  Medication Sig Dispense Refill  . acetaminophen (TYLENOL) 500 MG tablet Take 500 mg by mouth every 6 (six) hours as needed for mild pain.     Marland Kitchen albuterol (PROVENTIL HFA;VENTOLIN HFA) 108 (90 Base) MCG/ACT inhaler     .  apixaban (ELIQUIS) 5 MG TABS tablet Take 1 tablet (5 mg total) by mouth 2 (two) times daily. Must schedule Dr. Claiborne Billings appt for further refills 60 tablet 0  . CARTIA XT 240 MG 24 hr capsule TAKE ONE CAPSULE ONCE DAILY 90 capsule 1  . cetirizine (ZYRTEC) 10 MG tablet Take 10 mg by mouth daily as needed for allergies.     . diphenoxylate-atropine (LOMOTIL) 2.5-0.025 MG tablet TAKE TWO (2) TABLETS BY MOUTH EVERY MORNING (Patient taking differently: Take 2 tablets by mouth daily as needed. ) 60 tablet 0  . ezetimibe (ZETIA) 10 MG tablet Take 1 tablet (10 mg total) by mouth daily. 90 tablet 3  . Methylsulfonylmethane (MSM) 1000 MG CAPS Take by mouth.    . metoprolol tartrate (LOPRESSOR) 25 MG tablet Take 1 tablet (25 mg total) by mouth 2 (two) times daily. 180 tablet 3  . Multiple Vitamins-Minerals (CENTRUM SILVER 50+MEN PO) Take 1 tablet by mouth daily.    . Omega-3 Fatty Acids (FISH OIL) 1000 MG CAPS take 2 capsules daily (Patient taking differently: Take 1,000 mg by mouth 2 (two) times daily. )    . ondansetron (ZOFRAN) 4 MG tablet Take 4 mg by mouth every 8 (eight) hours as needed for nausea or vomiting.    . predniSONE (DELTASONE) 10 MG tablet Take 4 tabs PO daily x4 days, then take 3 tabs daily x4 days, then take 2 tabs daily x4 days, then take 1 tab daily 50 tablet 0  . tamsulosin (FLOMAX) 0.4 MG CAPS capsule Take 0.4 mg by mouth daily as needed.      No facility-administered medications prior to visit.      Allergies:   Lidocaine hcl; Xylocaine [lidocaine]; Codeine; Crestor [rosuvastatin calcium]; Percocet [oxycodone-acetaminophen]; and Statins   Social History   Socioeconomic History  . Marital status: Married    Spouse name: Not on file  . Number of children: Not on file  . Years of education: Not on file  . Highest education level: Not on file  Occupational History  . Not on file  Social Needs  . Financial resource strain: Not on file  . Food insecurity:    Worry: Not on file     Inability: Not on file  . Transportation needs:    Medical: Not on file    Non-medical: Not on file  Tobacco Use  . Smoking status: Former Smoker    Packs/day: 0.02    Years: 1.00    Pack years: 0.02    Types: Cigarettes    Last attempt to quit: 07/10/1971    Years since quitting: 46.4  . Smokeless tobacco: Never Used  . Tobacco comment: Patient smoked 1/2 pack per week  Substance and Sexual Activity  . Alcohol use: Yes    Alcohol/week: 21.0 standard drinks    Types: 21 Glasses of wine per week    Comment: 3 glasses of  wine nightly - 12/20/2016  . Drug use: No  . Sexual activity: Yes    Birth control/protection: Other-see comments    Comment: old age  Lifestyle  . Physical activity:    Days per week: Not on file    Minutes per session: Not on file  . Stress: Not on file  Relationships  . Social connections:    Talks on phone: Not on file    Gets together: Not on file    Attends religious service: Not on file    Active member of club or organization: Not on file    Attends meetings of clubs or organizations: Not on file    Relationship status: Not on file  Other Topics Concern  . Not on file  Social History Narrative  . Not on file     Family History:  The patient's family history includes Asthma in his father; Heart disease in his father; Melanoma in his brother; Rheum arthritis in his mother; Thyroid cancer in his mother and sister.   ROS:   Please see the history of present illness.    ROS All other systems reviewed and are negative.   PHYSICAL EXAM:   VS:  BP 122/62   Pulse 92   Ht '5\' 9"'  (1.753 m)   Wt 183 lb (83 kg)   SpO2 92% Comment: on room air  BMI 27.02 kg/m    GEN: Well nourished, well developed, in no acute distress  HEENT: normal  Neck: no JVD, carotid bruits, or masses Cardiac: RRR; no murmurs, rubs, or gallops,no edema  Respiratory:  clear to auscultation bilaterally, normal work of breathing GI: soft, nontender, nondistended, + BS MS: no  deformity or atrophy  Skin: warm and dry, no rash Neuro:  Alert and Oriented x 3, Strength and sensation are intact Psych: euthymic mood, full affect  Wt Readings from Last 3 Encounters:  12/19/17 183 lb (83 kg)  12/16/17 180 lb (81.6 kg)  02/07/17 192 lb (87.1 kg)      Studies/Labs Reviewed:   EKG:  EKG is ordered today.  The ekg ordered today demonstrates normal sinus rhythm, T wave inversion in lead V1 through V3.  Recent Labs: 12/16/2017: BUN 11; Creatinine, Ser 0.88; Hemoglobin 15.9; Platelets 216.0; Potassium 3.9; Sodium 137   Lipid Panel    Component Value Date/Time   CHOL 222 (H) 07/16/2016 0902   TRIG 359 (H) 07/16/2016 0902   HDL 67 07/16/2016 0902   CHOLHDL 3.3 07/16/2016 0902   VLDL 72 (H) 07/16/2016 0902   LDLCALC 83 07/16/2016 0902    Additional studies/ records that were reviewed today include:   Echo 03/22/2016 LV EF: 60% -   65% Study Conclusions  - Left ventricle: The cavity size was normal. Wall thickness was   normal. Systolic function was normal. The estimated ejection   fraction was in the range of 60% to 65%. Wall motion was normal;   there were no regional wall motion abnormalities. Doppler   parameters are consistent with abnormal left ventricular   relaxation (grade 1 diastolic dysfunction). - Aortic valve: There was no stenosis. - Aorta: Mildly dilated aortic root. Aortic root dimension: 38 mm   (ED). - Mitral valve: There was no significant regurgitation. - Right ventricle: The cavity size was normal. Systolic function   was normal. - Pulmonary arteries: No complete TR doppler jet so unable to   estimate PA systolic pressure. - Inferior vena cava: The vessel was normal in size. The  respirophasic diameter changes were in the normal range (>= 50%),   consistent with normal central venous pressure.  Impressions:  - Normal LV size with EF 60-65%. Normal RV size and systolic   function. No significant valvular  abnormalities.     ASSESSMENT:    1. PAF (paroxysmal atrial fibrillation) ([place])   2. Essential hypertension   3. Viral myocarditis, unspecified chronicity   4. Hyperlipidemia, unspecified hyperlipidemia type   5. OSA on CPAP      PLAN:  In order of problems listed above:  1. PAF: I suspect patient is having recurrent atrial fibrillation as there are periods where his heart rate become elevated with drop in the blood pressure.  We discussed 30-day event monitor versus AliveCor, he wished to try AliveCor for now.  I suspect the recent recurrence of atrial fibrillation is likely related to the pneumonitis and steroid use.  Continue Eliquis.  2. Hypertension: He has been having some hypotension recently, however this is accompanied by episodes of tachycardia.  I suspect he is having recurrence of atrial fibrillation.  3. History of viral myocarditis: Ejection fraction was low remotely, however this has normalized  4. Hyperlipidemia: Continue Zetia    Medication Adjustments/Labs and Tests Ordered: Current medicines are reviewed at length with the patient today.  Concerns regarding medicines are outlined above.  Medication changes, Labs and Tests ordered today are listed in the Patient Instructions below. Patient Instructions  Medication Instructions:   Your physician recommends that you continue on your current medications as directed. Please refer to the Current Medication list given to you today.  Labwork:  NONE  Testing/Procedures:  NONE  Follow-Up:  Your physician recommends that you schedule a follow-up appointment in: 1 month.  Any Other Special Instructions Will Be Listed Below (If Applicable).  Your physician recommends you getting AliveCor device to monitor your heart rhythm.  Your physician has requested that you regularly monitor and record your blood pressure & heart rate readings at home. Please use the same machine at the same time of day to check your  readings and record them to bring to your follow-up visit.   If you need a refill on your cardiac medications before your next appointment, please call your pharmacy.    Hilbert Corrigan, [place]  12/21/2017 11:49 PM    [facility] [place], [place], [place]  [address] Phone: [phone]; Fax: [phone]

## 2017-12-19 NOTE — Patient Instructions (Addendum)
Medication Instructions:   Your physician recommends that you continue on your current medications as directed. Please refer to the Current Medication list given to you today.  Labwork:  NONE  Testing/Procedures:  NONE  Follow-Up:  Your physician recommends that you schedule a follow-up appointment in: 1 month.  Any Other Special Instructions Will Be Listed Below (If Applicable).  Your physician recommends you getting AliveCor device to monitor your heart rhythm.  Your physician has requested that you regularly monitor and record your blood pressure & heart rate readings at home. Please use the same machine at the same time of day to check your readings and record them to bring to your follow-up visit.   If you need a refill on your cardiac medications before your next appointment, please call your pharmacy.

## 2017-12-21 ENCOUNTER — Encounter: Payer: Self-pay | Admitting: Physician Assistant

## 2017-12-23 ENCOUNTER — Other Ambulatory Visit: Payer: Self-pay | Admitting: Cardiovascular Disease

## 2017-12-23 NOTE — Telephone Encounter (Signed)
Rx request sent to pharmacy.  

## 2017-12-30 DIAGNOSIS — Z6826 Body mass index (BMI) 26.0-26.9, adult: Secondary | ICD-10-CM | POA: Diagnosis not present

## 2017-12-30 DIAGNOSIS — M353 Polymyalgia rheumatica: Secondary | ICD-10-CM | POA: Diagnosis not present

## 2017-12-30 DIAGNOSIS — Z23 Encounter for immunization: Secondary | ICD-10-CM | POA: Diagnosis not present

## 2017-12-30 DIAGNOSIS — J849 Interstitial pulmonary disease, unspecified: Secondary | ICD-10-CM | POA: Diagnosis not present

## 2017-12-30 DIAGNOSIS — E782 Mixed hyperlipidemia: Secondary | ICD-10-CM | POA: Diagnosis not present

## 2017-12-30 DIAGNOSIS — I482 Chronic atrial fibrillation: Secondary | ICD-10-CM | POA: Diagnosis not present

## 2017-12-30 DIAGNOSIS — R7301 Impaired fasting glucose: Secondary | ICD-10-CM | POA: Diagnosis not present

## 2017-12-30 DIAGNOSIS — Z0000 Encounter for general adult medical examination without abnormal findings: Secondary | ICD-10-CM | POA: Diagnosis not present

## 2017-12-30 DIAGNOSIS — R0602 Shortness of breath: Secondary | ICD-10-CM | POA: Diagnosis not present

## 2017-12-30 DIAGNOSIS — I10 Essential (primary) hypertension: Secondary | ICD-10-CM | POA: Diagnosis not present

## 2017-12-30 DIAGNOSIS — N400 Enlarged prostate without lower urinary tract symptoms: Secondary | ICD-10-CM | POA: Diagnosis not present

## 2017-12-30 DIAGNOSIS — T7840XA Allergy, unspecified, initial encounter: Secondary | ICD-10-CM | POA: Diagnosis not present

## 2017-12-31 DIAGNOSIS — E782 Mixed hyperlipidemia: Secondary | ICD-10-CM | POA: Diagnosis not present

## 2017-12-31 DIAGNOSIS — I482 Chronic atrial fibrillation: Secondary | ICD-10-CM | POA: Diagnosis not present

## 2017-12-31 DIAGNOSIS — Z23 Encounter for immunization: Secondary | ICD-10-CM | POA: Diagnosis not present

## 2017-12-31 DIAGNOSIS — I10 Essential (primary) hypertension: Secondary | ICD-10-CM | POA: Diagnosis not present

## 2017-12-31 DIAGNOSIS — R7301 Impaired fasting glucose: Secondary | ICD-10-CM | POA: Diagnosis not present

## 2017-12-31 DIAGNOSIS — T7840XA Allergy, unspecified, initial encounter: Secondary | ICD-10-CM | POA: Diagnosis not present

## 2018-01-09 ENCOUNTER — Ambulatory Visit: Payer: PPO | Admitting: Pulmonary Disease

## 2018-01-10 ENCOUNTER — Encounter: Payer: Self-pay | Admitting: Pulmonary Disease

## 2018-01-10 ENCOUNTER — Other Ambulatory Visit (HOSPITAL_COMMUNITY): Payer: Self-pay | Admitting: Specialist

## 2018-01-10 ENCOUNTER — Ambulatory Visit (INDEPENDENT_AMBULATORY_CARE_PROVIDER_SITE_OTHER): Payer: PPO | Admitting: Pulmonary Disease

## 2018-01-10 ENCOUNTER — Ambulatory Visit: Payer: PPO | Admitting: Pulmonary Disease

## 2018-01-10 VITALS — BP 122/74 | HR 75 | Ht 69.0 in | Wt 179.0 lb

## 2018-01-10 DIAGNOSIS — R1319 Other dysphagia: Secondary | ICD-10-CM

## 2018-01-10 DIAGNOSIS — Z23 Encounter for immunization: Secondary | ICD-10-CM

## 2018-01-10 DIAGNOSIS — R1310 Dysphagia, unspecified: Secondary | ICD-10-CM

## 2018-01-10 DIAGNOSIS — J849 Interstitial pulmonary disease, unspecified: Secondary | ICD-10-CM

## 2018-01-10 LAB — PULMONARY FUNCTION TEST
DL/VA % pred: 81 %
DL/VA: 3.71 ml/min/mmHg/L
DLCO cor % pred: 65 %
DLCO cor: 20.44 ml/min/mmHg
DLCO unc % pred: 68 %
DLCO unc: 21.15 ml/min/mmHg
FEF 25-75 Post: 3.14 L/sec
FEF 25-75 Pre: 2.75 L/sec
FEF2575-%Change-Post: 14 %
FEF2575-%Pred-Post: 133 %
FEF2575-%Pred-Pre: 116 %
FEV1-%Change-Post: 1 %
FEV1-%Pred-Post: 92 %
FEV1-%Pred-Pre: 91 %
FEV1-Post: 2.88 L
FEV1-Pre: 2.85 L
FEV1FVC-%Change-Post: 0 %
FEV1FVC-%Pred-Pre: 109 %
FEV6-%Change-Post: 0 %
FEV6-%Pred-Post: 88 %
FEV6-%Pred-Pre: 87 %
FEV6-Post: 3.53 L
FEV6-Pre: 3.52 L
FEV6FVC-%Pred-Post: 106 %
FEV6FVC-%Pred-Pre: 106 %
FVC-%Change-Post: 0 %
FVC-%Pred-Post: 83 %
FVC-%Pred-Pre: 82 %
FVC-Post: 3.53 L
FVC-Pre: 3.52 L
Post FEV1/FVC ratio: 82 %
Post FEV6/FVC ratio: 100 %
Pre FEV1/FVC ratio: 81 %
Pre FEV6/FVC Ratio: 100 %
RV % pred: 155 %
RV: 3.73 L
TLC % pred: 106 %
TLC: 7.25 L

## 2018-01-10 MED ORDER — AEROCHAMBER MV MISC: 0 refills | Status: DC

## 2018-01-10 NOTE — Progress Notes (Signed)
Patient completed full PFT today. 

## 2018-01-10 NOTE — Progress Notes (Signed)
Subjective:    [name], male    DOB: 1945/11/05, 72 y.o.   MRN: [id number]  Synopsis: Referred in November 2017 for evaluation of shortness of breath.  Found to have diffuse parenchymal lung disease of undetermined cause.   HPI Chief Complaint  Patient presents with  . Follow-up    follow up from PFT   [name] is been struggling recently.  He has been having increasing shortness of breath and has been put on multiple rounds of prednisone which she says is always somewhat helpful.  Albuterol has been helpful when he takes it as well.  He says that when the prednisone starts to taper off he starts to become more short of breath.  He recently received an injection of steroids from his primary care physician which helped his breathing improved.  He is not coughing up mucus though he has had a dry cough for quite some time.  He says that he has trouble swallowing from time to time.  He has noticed no other systemic problems other than A. fib.  No problems with rash or joint aches that he can explain.  Past Medical History:  Diagnosis Date  . Arthritis   . Chest pain    a. 03/2017: echo showing EF of 60-65%, no regional WMA or significant valve abnormalities. b. 03/2016: NST with no evidence of ischemia.   . Digestive problems    on Prednisone prn for this issue- diarrhea  . Dysrhythmia    irregular due to Myocarditis- takes Atenolol, Norvasc  . GERD (gastroesophageal reflux disease)   . H/O viral myocarditis    25 years ago  . Head injury, closed, with concussion    Breif LOC  . Hyperlipidemia   . Mild mitral valve prolapse    per Dr Evette Georges notes  . PAF (paroxysmal atrial fibrillation) ([place])    a. initially occuring in 02/2016. b. recurrent in 07/2016. Placed on Eliquis  . Polymyalgia rheumatica (HCC)      Review of Systems  Constitutional: Negative for chills, diaphoresis, fatigue and fever.  HENT: Negative for rhinorrhea, sinus pressure and sinus  pain.   Respiratory: Negative for cough, shortness of breath and wheezing.   Cardiovascular: Negative for chest pain and leg swelling.       Objective:   Physical Exam  Vitals:   01/10/18 1157  BP: 122/74  Pulse: 75  SpO2: 94%  Weight: 179 lb (81.2 kg)  Height: 5\' 9"  (1.753 m)  RA   Gen: well appearing HENT: OP clear, TM's clear, neck supple PULM: CTA B, normal percussion CV: RRR, no mgr, trace edema GI: BS+, soft, nontender Derm: no cyanosis or rash Psyche: normal mood and affect    Imaging: November 2017 high-resolution CT scan of the chest images personally reviewed showing patchy groundglass bilaterally which appears predominantly to be patchy lower lobe atelectasis  Barium swallow from December 2017 no evidence of esophageal disorder. January 2018 high-resolution CT scan of the chest showed mild basilar predominant subpleural reticulation and groundglass suggesting nonspecific interstitial pneumonitis. (images independently reviewed 01/10/2018) July 2019 HRCT> images independently reviewed showing fibrotic changes in the bases of the lungs in a nonspecific pattern, not consistent with UIP.  Records from her visit earlier this month with our nurse practitioner reviewed where he was seen for shortness of breath.  Prednisone and a rescue inhaler were prescribed  blood work  November 2017 reviewed: CRP 139, total CK normal, antineutrophil cytoplasmic antibody testing negative  Pulmonary function testing 04/2016 Ratio 83%, FEV1 3.02 L 93%, FVC 3.06 L , total lung capacity 5. 12/17/1982 percent protected, DLCO 23.9 75% predicted  August 2019 pulmonary function testing ratio normal, FVC 3.53 L 83% predicted, total lung capacity 6.25 L 106% predicted, residual volume 155% predicted, DLCO 21.2 mL 68% predicted    Assessment & Plan:   ILD (interstitial lung disease) ([place]) - Plan: Sedimentation rate, Aldolase, Jo-1 antibody-IgG, Rheumatoid factor, Sjogren's syndrome  antibods(ssa + ssb), Centromere Antibodies, Anti-scleroderma antibody, Anti-scleroderma antibody, Cyclic citrul peptide antibody, IgG, Hypersensitivity pnuemonitis profile, ANA  Dysphagia, unspecified type - Plan: SLP modified barium swallow  Discussion: I worry that [name] has nonspecific interstitial pneumonitis, fibrosing type because the imaging findings were worse compared to before.  Interestingly though his lung function testing is made very little change but he has worsening symptoms.  I think he needs to have an open lung biopsy.  I am going to repeat lab work today to look for evidence of an underlying connective tissue disease.  Because of his history of dysphagia and choking on food and then get a modified barium swallow to make sure there is no evidence of aspiration pneumonitis.  Plan: Fibrotic lung disease: I am going to present her case to the interstitial lung disease conference to determine if we think that a biopsy is necessary to make a diagnosis We will check lab work today to make sure there is no evidence of an underlying inflammatory connective tissue disease which is causing your lung problem We are going to check a modified barium swallow because of your history of dysphasia so that we can rule out silent aspiration Pneumonia vaccine today (Prevnar) Flu shot next visit  Airtrapping on lung function testing: Continue albuterol Add spacer  We will call you after the interstitial lung disease conference  Plan on following up with me in 2 to 4 weeks, though we may adjust that date depending on whether or not you need to have surgery for a biopsy.    Current Outpatient Medications:  .  acetaminophen (TYLENOL) 500 MG tablet, Take 500 mg by mouth every 6 (six) hours as needed for mild pain. , Disp: , Rfl:  .  albuterol (PROVENTIL HFA;VENTOLIN HFA) 108 (90 Base) MCG/ACT inhaler, , Disp: , Rfl:  .  apixaban (ELIQUIS) 5 MG TABS tablet, Take 1 tablet (5 mg total) by  mouth 2 (two) times daily. Must schedule Dr. Claiborne Billings appt for further refills, Disp: 60 tablet, Rfl: 0 .  CARTIA XT 240 MG 24 hr capsule, TAKE ONE CAPSULE BY MOUTH DAILY, Disp: 90 capsule, Rfl: 1 .  cetirizine (ZYRTEC) 10 MG tablet, Take 10 mg by mouth daily as needed for allergies. , Disp: , Rfl:  .  diphenoxylate-atropine (LOMOTIL) 2.5-0.025 MG tablet, TAKE TWO (2) TABLETS BY MOUTH EVERY MORNING (Patient taking differently: Take 2 tablets by mouth daily as needed. ), Disp: 60 tablet, Rfl: 0 .  Methylsulfonylmethane (MSM) 1000 MG CAPS, Take by mouth., Disp: , Rfl:  .  Multiple Vitamins-Minerals (CENTRUM SILVER 50+MEN PO), Take 1 tablet by mouth daily., Disp: , Rfl:  .  Omega-3 Fatty Acids (FISH OIL) 1000 MG CAPS, take 2 capsules daily (Patient taking differently: Take 1,000 mg by mouth 2 (two) times daily. ), Disp: , Rfl:  .  ondansetron (ZOFRAN) 4 MG tablet, Take 4 mg by mouth every 8 (eight) hours as needed for nausea or vomiting., Disp: , Rfl:  .  predniSONE (DELTASONE) 10 MG tablet, Take 4  tabs PO daily x4 days, then take 3 tabs daily x4 days, then take 2 tabs daily x4 days, then take 1 tab daily, Disp: 50 tablet, Rfl: 0 .  tamsulosin (FLOMAX) 0.4 MG CAPS capsule, Take 0.4 mg by mouth daily as needed. , Disp: , Rfl:  .  ezetimibe (ZETIA) 10 MG tablet, Take 1 tablet (10 mg total) by mouth daily., Disp: 90 tablet, Rfl: 3 .  metoprolol tartrate (LOPRESSOR) 25 MG tablet, Take 1 tablet (25 mg total) by mouth 2 (two) times daily., Disp: 180 tablet, Rfl: 3

## 2018-01-10 NOTE — Patient Instructions (Signed)
Fibrotic lung disease: I am going to present her case to the interstitial lung disease conference to determine if we think that a biopsy is necessary to make a diagnosis We will check lab work today to make sure there is no evidence of an underlying inflammatory connective tissue disease which is causing your lung problem We are going to check a modified barium swallow because of your history of dysphasia so that we can rule out silent aspiration Pneumonia vaccine today (Prevnar) Flu shot next visit  We will call you after the interstitial lung disease conference  Plan on following up with me in 2 to 4 weeks, though we may adjust that date depending on whether or not you need to have surgery for a biopsy.

## 2018-01-14 ENCOUNTER — Other Ambulatory Visit (INDEPENDENT_AMBULATORY_CARE_PROVIDER_SITE_OTHER): Payer: PPO

## 2018-01-14 ENCOUNTER — Telehealth (HOSPITAL_COMMUNITY): Payer: Self-pay | Admitting: Internal Medicine

## 2018-01-14 DIAGNOSIS — J849 Interstitial pulmonary disease, unspecified: Secondary | ICD-10-CM | POA: Diagnosis not present

## 2018-01-14 LAB — SEDIMENTATION RATE: Sed Rate: 13 mm/hr (ref 0–20)

## 2018-01-14 NOTE — Telephone Encounter (Signed)
01/14/18  Left a message to inform patient of MBSS date and time.

## 2018-01-15 LAB — CENTROMERE ANTIBODIES: Centromere Ab Screen: <1 AI

## 2018-01-15 LAB — ANTI-SCLERODERMA ANTIBODY: Scleroderma (Scl-70) (ENA) Antibody, IgG: <1 AI

## 2018-01-19 LAB — RHEUMATOID FACTOR: Rhuematoid fact SerPl-aCnc: 38 IU/mL — ABNORMAL HIGH (ref <14)

## 2018-01-19 LAB — CYCLIC CITRUL PEPTIDE ANTIBODY, IGG: Cyclic Citrullin Peptide Ab: <16 UNITS

## 2018-01-19 LAB — ALDOLASE: Aldolase: 5.2 U/L (ref <=8.1)

## 2018-01-19 LAB — HYPERSENSITIVITY PNUEMONITIS PROFILE
ASPERGILLUS FUMIGATUS: NEGATIVE
Faenia retivirgula: NEGATIVE
Pigeon Serum: NEGATIVE
S. VIRIDIS: NEGATIVE
T. CANDIDUS: NEGATIVE
T. VULGARIS: NEGATIVE

## 2018-01-19 LAB — JO-1 ANTIBODY-IGG: Jo-1 Autoabs: <1 AI

## 2018-01-19 LAB — SJOGREN'S SYNDROME ANTIBODS(SSA + SSB)
SSA (Ro) (ENA) Antibody, IgG: <1 AI
SSB (La) (ENA) Antibody, IgG: <1 AI

## 2018-01-19 LAB — ANA: Anti Nuclear Antibody(ANA): NEGATIVE

## 2018-01-19 LAB — ANTI-SCLERODERMA ANTIBODY: Scleroderma (Scl-70) (ENA) Antibody, IgG: <1 AI

## 2018-01-20 DIAGNOSIS — Z6826 Body mass index (BMI) 26.0-26.9, adult: Secondary | ICD-10-CM | POA: Diagnosis not present

## 2018-01-20 DIAGNOSIS — Z0000 Encounter for general adult medical examination without abnormal findings: Secondary | ICD-10-CM | POA: Diagnosis not present

## 2018-01-20 DIAGNOSIS — R7301 Impaired fasting glucose: Secondary | ICD-10-CM | POA: Diagnosis not present

## 2018-01-20 DIAGNOSIS — T7840XA Allergy, unspecified, initial encounter: Secondary | ICD-10-CM | POA: Diagnosis not present

## 2018-01-20 DIAGNOSIS — I10 Essential (primary) hypertension: Secondary | ICD-10-CM | POA: Diagnosis not present

## 2018-01-20 DIAGNOSIS — Z23 Encounter for immunization: Secondary | ICD-10-CM | POA: Diagnosis not present

## 2018-01-20 DIAGNOSIS — M353 Polymyalgia rheumatica: Secondary | ICD-10-CM | POA: Diagnosis not present

## 2018-01-20 DIAGNOSIS — E782 Mixed hyperlipidemia: Secondary | ICD-10-CM | POA: Diagnosis not present

## 2018-01-20 DIAGNOSIS — N400 Enlarged prostate without lower urinary tract symptoms: Secondary | ICD-10-CM | POA: Diagnosis not present

## 2018-01-20 DIAGNOSIS — R0981 Nasal congestion: Secondary | ICD-10-CM | POA: Diagnosis not present

## 2018-01-20 DIAGNOSIS — I482 Chronic atrial fibrillation: Secondary | ICD-10-CM | POA: Diagnosis not present

## 2018-01-20 DIAGNOSIS — R0602 Shortness of breath: Secondary | ICD-10-CM | POA: Diagnosis not present

## 2018-01-21 IMAGING — CT CT CHEST W/O CM
2 of 3 series · 15 of 36 positions shown, 18 images · non-contrast
Comparison: None.

CLINICAL DATA: One month history of shortness of breath.

EXAM:
CT CHEST WITHOUT CONTRAST
TECHNIQUE: Multidetector CT imaging of the chest was performed following the
standard protocol without IV contrast.

[Series 2: thorax · axial · 0.76mm/px · z∈[-334,-78]mm · 12 of 152 slices shown, 15 images]
[im 12/152  mediastinal]
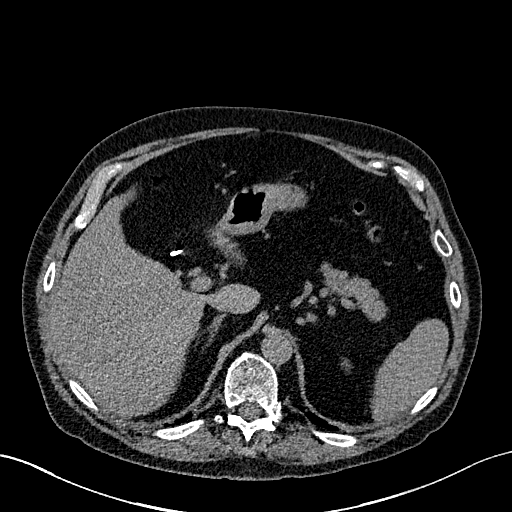
[im 12/152  lung]
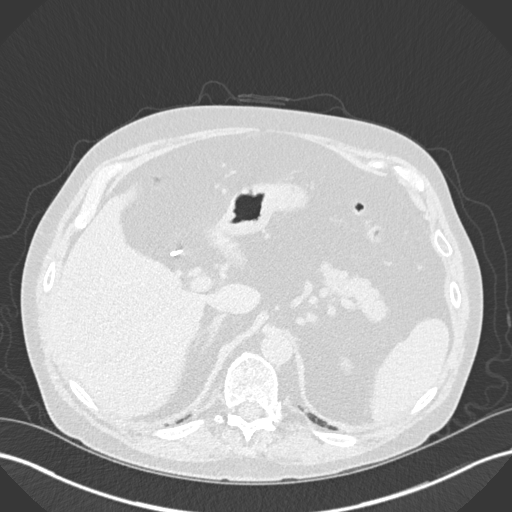
[im 23/152  lung]
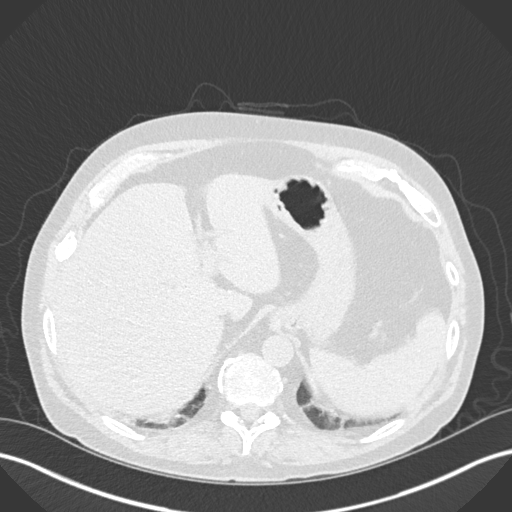
[im 34/152  lung]
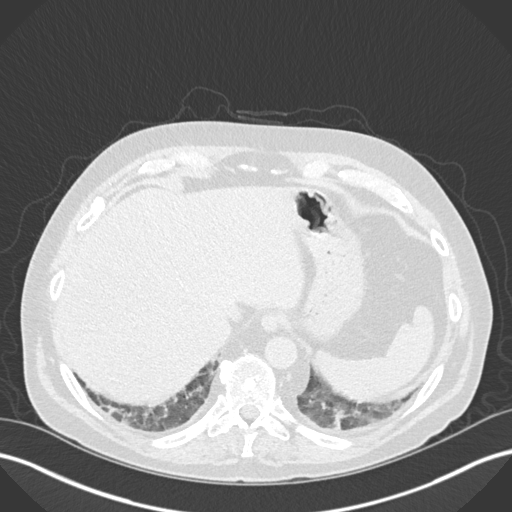
[im 45/152  lung]
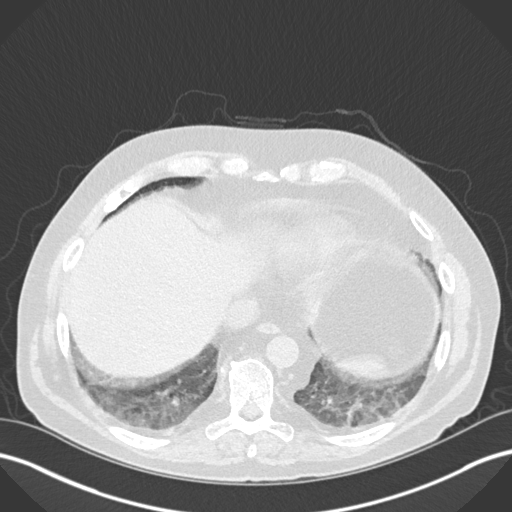
[im 56/152  mediastinal]
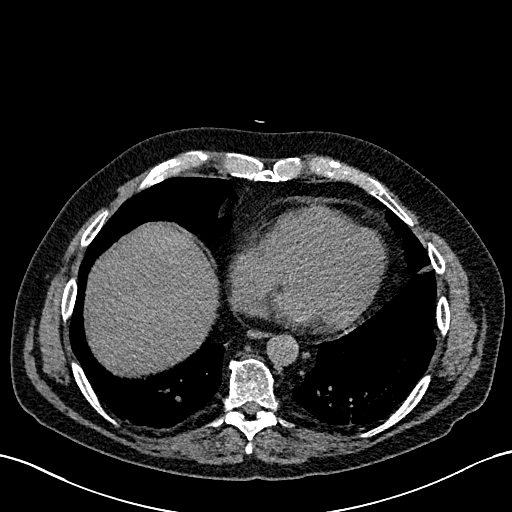
[im 56/152  lung]
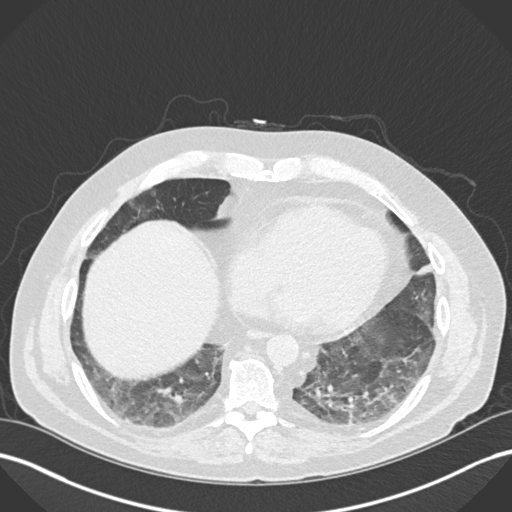
[im 68/152  lung]
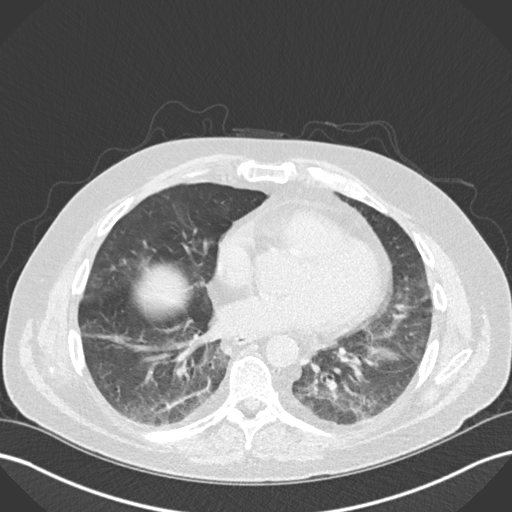
[im 84/152  lung]
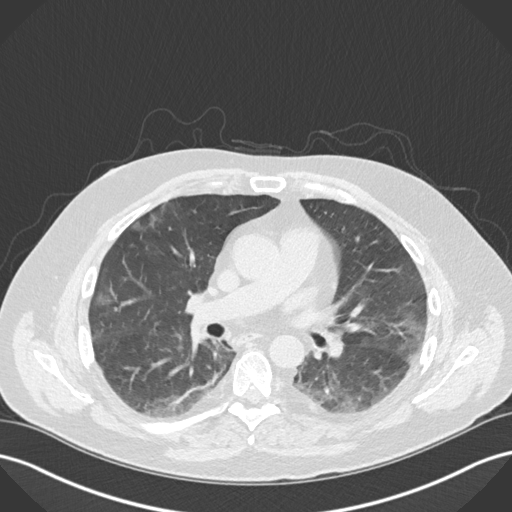
[im 96/152  lung]
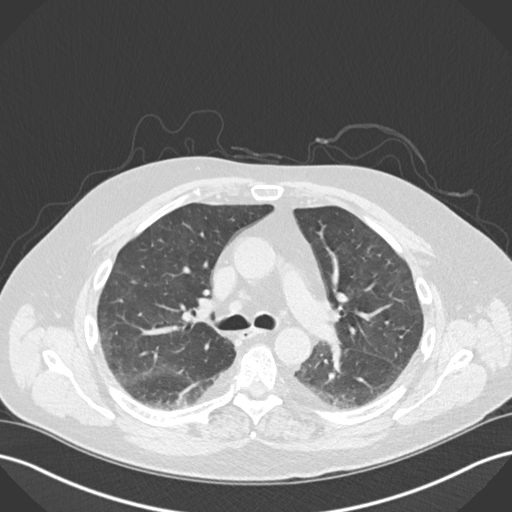
[im 107/152  mediastinal]
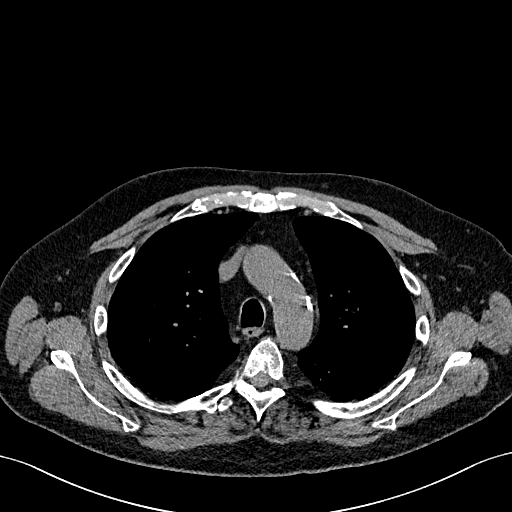
[im 107/152  lung]
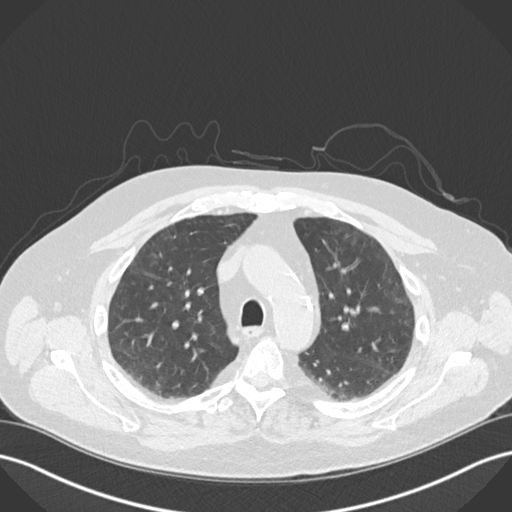
[im 118/152  lung]
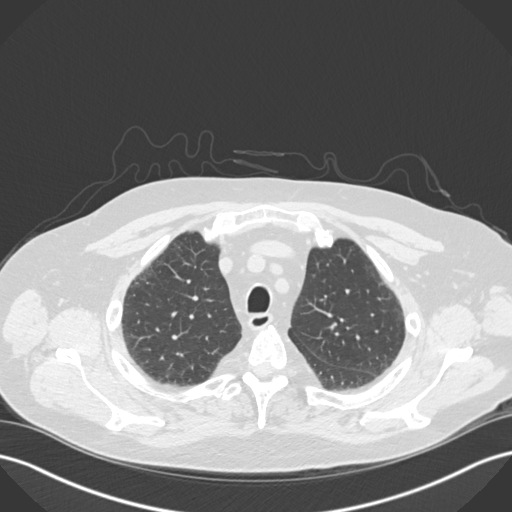
[im 129/152  lung]
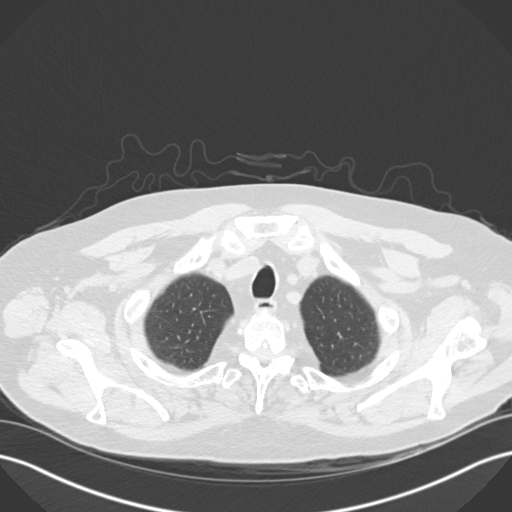
[im 140/152  lung]
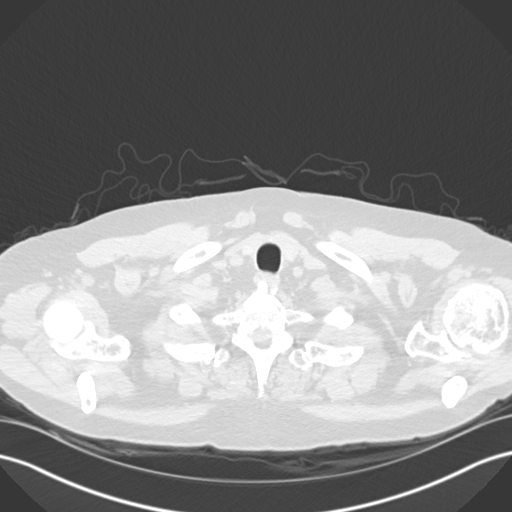

[Series 5: coronal · coronal · 0.59mm/px · 3 of 116 slices shown]
[im 24/116  lung]
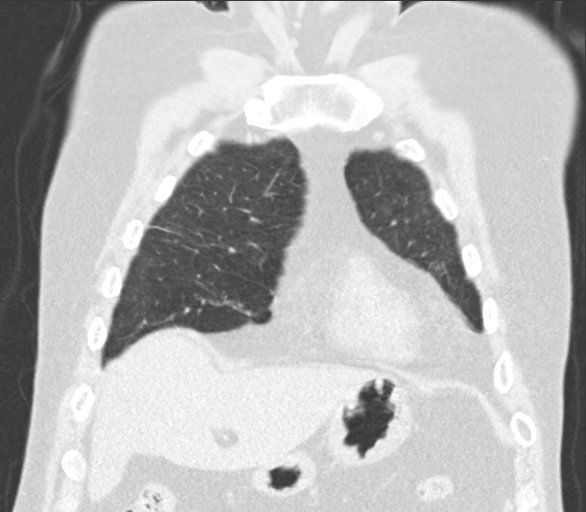
[im 47/116  lung]
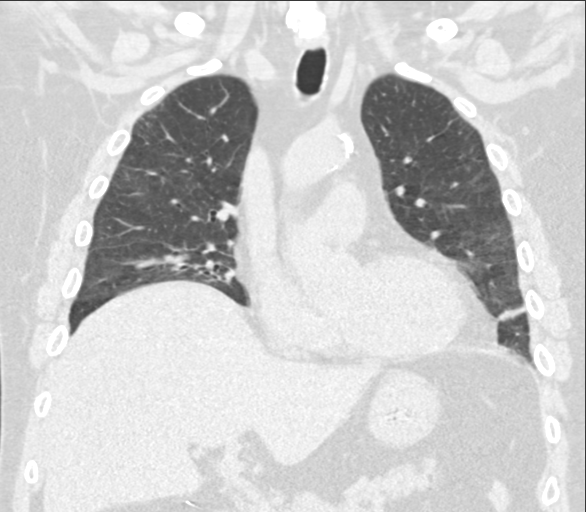
[im 70/116  lung]
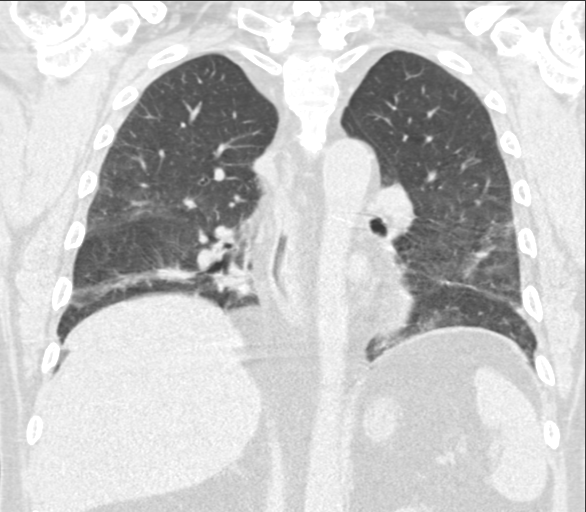

[15 of 36 positions shown; findings below may reference images not displayed]

FINDINGS: Cardiovascular: The heart size is normal. No pericardial effusion.
Atherosclerotic calcification is noted in the wall of the thoracic
aorta.

Mediastinum/Nodes: No mediastinal lymphadenopathy. No evidence for
gross hilar lymphadenopathy although assessment is limited by the
lack of intravenous contrast on today's study. There is no axillary
lymphadenopathy.

Lungs/Pleura: Fine detail obscured by breathing motion. Dependent
linear and platelike atelectasis noted in the lower lungs
bilaterally. Components of associated parenchymal scarring not
excluded. No evidence for pulmonary edema. No dense focal airspace
consolidation. No evidence for pleural effusion.

Upper Abdomen: The liver shows diffusely decreased attenuation
suggesting steatosis. Gallbladder surgically absent.

Musculoskeletal: Degenerative changes noted in the shoulders
bilaterally.
IMPRESSION: Relatively diffuse atelectatic changes in the lungs bilaterally.
Study degraded by patient breathing motion.

No specific findings to explain the patient's history of shortness
of breath.

## 2018-01-22 ENCOUNTER — Telehealth: Payer: Self-pay | Admitting: Pulmonary Disease

## 2018-01-22 ENCOUNTER — Encounter: Payer: Self-pay | Admitting: Pulmonary Disease

## 2018-01-22 DIAGNOSIS — M353 Polymyalgia rheumatica: Secondary | ICD-10-CM | POA: Diagnosis not present

## 2018-01-22 DIAGNOSIS — E7420 Disorders of galactose metabolism, unspecified: Secondary | ICD-10-CM | POA: Diagnosis not present

## 2018-01-22 DIAGNOSIS — E1165 Type 2 diabetes mellitus with hyperglycemia: Secondary | ICD-10-CM | POA: Diagnosis not present

## 2018-01-22 DIAGNOSIS — Z6826 Body mass index (BMI) 26.0-26.9, adult: Secondary | ICD-10-CM | POA: Diagnosis not present

## 2018-01-22 DIAGNOSIS — I482 Chronic atrial fibrillation: Secondary | ICD-10-CM | POA: Diagnosis not present

## 2018-01-22 DIAGNOSIS — N401 Enlarged prostate with lower urinary tract symptoms: Secondary | ICD-10-CM | POA: Diagnosis not present

## 2018-01-22 DIAGNOSIS — G4730 Sleep apnea, unspecified: Secondary | ICD-10-CM | POA: Diagnosis not present

## 2018-01-22 DIAGNOSIS — I10 Essential (primary) hypertension: Secondary | ICD-10-CM | POA: Diagnosis not present

## 2018-01-22 DIAGNOSIS — J849 Interstitial pulmonary disease, unspecified: Secondary | ICD-10-CM

## 2018-01-22 DIAGNOSIS — Z23 Encounter for immunization: Secondary | ICD-10-CM | POA: Diagnosis not present

## 2018-01-22 DIAGNOSIS — E782 Mixed hyperlipidemia: Secondary | ICD-10-CM | POA: Diagnosis not present

## 2018-01-22 NOTE — Progress Notes (Addendum)
   Interstitial Lung Disease Multidisciplinary Conference   [name]    MRN [id number]    DOB 09-01-1945  [facility], [name], MD  Referring Physician: Simonne Maffucci MD  Date of conference: 01/21/2018  Participating Pulmonary: Dr. Brand Males, MD ;  Dr Marshell Garfinkel, MD Pathology:  Dr Jaquita Folds, MD Radiology:  Dr Vinnie Langton MD Others:   Brief History:  72 year old with diffuse interstitial lung disease of unclear etiology.   Asbestos exposure in 1970, dust exposure Initial improvement in 2018 but has worsening symptms in 2019  Serology:  Negative except for mild increase in rheumatoid factor  MDD discussion of CT scan:  Basal -  Zonal predominance Subpleural reticulation, minimal bronchiolectasis- subpleural findings No - Traction Bronchiectasis Yes - Reticulation None  - Honeycombing Mild - Air trapping Yes - Ground Glass Opacities Indeterminate - is the final conclusion per 2018 ATS Criteria Progressive CT findings  Pathology discussion of biopsy: No biopsy available  PFTs:  01/10/18- Diffusion impairment, air trapping. Worse since July 2018    Labs:  Rheumatoid Factor- 38  MDD Impression/Recs:  Unspecified interstitial lung disease, indeterminate CT by ATS criteria Progressive findings on CT scan and PFTs Has mild air trapping-? HP or NSIP Rheumatoid factor elevation is non specific  Would be appropriate for surgical lung biopsy Can consider bronchoscope with BAL and veracyte genomic classifier but we are unsure when this would be commercially available.   Marshell Garfinkel MD [facility] 01/22/2018, 5:31 AM   References: Diagnosis of Idiopathic Pulmonary Fibrosis. An Official ATS/ERS/JRS/ALAT Clinical Practice Guideline. Raghu G et al, [place]. 2018 Sep 1;198(5):e44-e68.   IPF Suspected   Histopath ology Pattern      UIP  Probable UIP  Indeterminate for  UIP    Alternative  diagnosis    UIP  IPF  IPF  IPF  Non-IPF dx   HRCT   Probabe UIP  IPF  IPF  IPF (Likely)**  Non-IPF dx  Pattern  Indeterminate for UIP  IPF  IPF (Likely)**  Indeterminate  for IPF**  Non-IPF dx    Alternative diagnosis  IPF (Likely)**/ non-IPF dx  Non-IPF dx  Non-IPF dx  Non-IPF dx     Idiopathic pulmonary fibrosis diagnosis based upon HRCT and Biopsy paterns.  ** IPF is the likely diagnosis when any of following features are present:  . Moderate-to-severe traction bronchiectasis/bronchiolectasis (defined as mild traction bronchiectasis/bronchiolectasis in four or more lobes including the lingual as a lobe, or moderate to severe traction bronchiectasis in two or more lobes) in a man over age 41 years or in a woman over age 36 years . Extensive (>30%) reticulation on HRCT and an age >70 years  . Increased neutrophils and/or absence of lymphocytosis in BAL fluid  . Multidisciplinary discussion reaches a confident diagnosis of IPF.   **Indeterminate for IPF  . Without an adequate biopsy is unlikely to be IPF  . With an adequate biopsy may be reclassified to a more specific diagnosis after multidisciplinary discussion and/or additional consultation.   dx = diagnosis; HRCT = high-resolution computed tomography; IPF = idiopathic pulmonary fibrosis; UIP = usual interstitial pneumonia.

## 2018-01-22 NOTE — Telephone Encounter (Signed)
Please let him know that our multi-discplinary interstitial lung disease board met and discussed his situation including his labs, PFT's and imaging.  They agree with my plan for him to have an open lung biopsy.  Please refer him to Dr. Roxan Hockey for an open lung biopsy.  I'm happy to discuss with him further by phone if he would like, though I do not think he will find this recommendation surprising as we have already discussed it.

## 2018-01-22 NOTE — Telephone Encounter (Signed)
Please contact [name] and let him know that our multi-disciplinary interstitial lung disease team met and discussed his situation after reviewing his labs, PFT's and images.  They feel that an open lung biopsy would be helpful.  This is consistent with my recommendation.  I'd like for him to be referred to thoracic surgery, Dr. Roxan Hockey, for an open lung biopsy.

## 2018-01-23 ENCOUNTER — Telehealth: Payer: Self-pay | Admitting: Pulmonary Disease

## 2018-01-23 DIAGNOSIS — J849 Interstitial pulmonary disease, unspecified: Secondary | ICD-10-CM

## 2018-01-23 NOTE — Telephone Encounter (Signed)
Attempted to call patient, no answer, left message to call back.  

## 2018-01-23 NOTE — Telephone Encounter (Signed)
Called and spoke with patient regarding results.  Informed the patient of results and recommendations today. Placed referral to Dr. Modesto Charon MD - Cardiovascular thoracic Surgeon with [facility] Pt verbalized understanding and denied any questions or concerns at this time.  Nothing further needed.    BQ result recommendations.    Please let him know that our multi-discplinary interstitial lung disease board met and discussed his situation including his labs, PFT's and imaging.  They agree with my plan for him to have an open lung biopsy.  Please refer him to Dr. Roxan Hockey for an open lung biopsy.  I'm happy to discuss with him further by phone if he would like, though I do not think he will find this recommendation surprising as we have already discussed it.

## 2018-01-24 NOTE — Telephone Encounter (Signed)
Spoke with pt, aware of recs.  Answered all questions to best of my ability.  Pt did not wish to speak with BQ at this time, states that he will reserve any questions regarding lung biopsy for Dr. Roxan Hockey.  Referral placed.  Nothing further needed.

## 2018-01-24 NOTE — Addendum Note (Signed)
Addended by: Len Blalock on: 01/24/2018 11:49 AM   Modules accepted: Orders

## 2018-01-28 ENCOUNTER — Other Ambulatory Visit: Payer: Self-pay | Admitting: Cardiovascular Disease

## 2018-01-29 ENCOUNTER — Other Ambulatory Visit: Payer: Self-pay

## 2018-01-29 ENCOUNTER — Encounter (HOSPITAL_COMMUNITY): Payer: Self-pay | Admitting: Speech Pathology

## 2018-01-29 ENCOUNTER — Ambulatory Visit (HOSPITAL_COMMUNITY)
Admission: RE | Admit: 2018-01-29 | Discharge: 2018-01-29 | Disposition: A | Discharge status: Home / Self Care | Payer: PPO | Source: Ambulatory Visit | Attending: Pulmonary Disease | Admitting: Pulmonary Disease

## 2018-01-29 ENCOUNTER — Ambulatory Visit (HOSPITAL_COMMUNITY): Payer: PPO | Attending: Internal Medicine | Admitting: Speech Pathology

## 2018-01-29 DIAGNOSIS — R1319 Other dysphagia: Secondary | ICD-10-CM | POA: Diagnosis not present

## 2018-01-29 DIAGNOSIS — R1310 Dysphagia, unspecified: Secondary | ICD-10-CM | POA: Diagnosis not present

## 2018-01-29 NOTE — Therapy (Signed)
[place], [place], [address] Phone: [phone]   Fax:  [phone]  Modified Barium Swallow  Patient Details  Name: [name] MRN: [id number] Date of Birth: [birth year] No data recorded  Encounter Date: 01/29/2018  End of Session - 01/29/18 1421    Visit Number  1    Number of Visits  1    Authorization Type  Medicare    SLP Start Time  1335    SLP Stop Time   1400    SLP Time Calculation (min)  25 min    Activity Tolerance  Patient tolerated treatment well       Past Medical History:  Diagnosis Date  . Arthritis   . Chest pain    a. 03/2017: echo showing EF of 60-65%, no regional WMA or significant valve abnormalities. b. 03/2016: NST with no evidence of ischemia.   . Digestive problems    on Prednisone prn for this issue- diarrhea  . Dysrhythmia    irregular due to Myocarditis- takes Atenolol, Norvasc  . GERD (gastroesophageal reflux disease)   . H/O viral myocarditis    25 years ago  . Head injury, closed, with concussion    Breif LOC  . Hyperlipidemia   . Mild mitral valve prolapse    per Dr [name] notes  . PAF (paroxysmal atrial fibrillation) ([place])    a. initially occuring in 02/2016. b. recurrent in 07/2016. Placed on Eliquis  . Polymyalgia rheumatica (HCC)     Past Surgical History:  Procedure Laterality Date  . apendectomy  1965  . APPENDECTOMY    . bone spur Bilateral 1999   feet  . CARDIAC CATHETERIZATION  2001  . CHOLECYSTECTOMY  2004  . COLONOSCOPY    . LUMBAR LAMINECTOMY/ DECOMPRESSION WITH MET-RX Right 05/05/2013   Procedure: Right Lumbar three-four Extraforaminal Microdiskectomy with Metrex;  Surgeon: Randall Miss, MD;  Location: [facility];  Service: Neurosurgery;  Laterality: Right;  Right Lumbar three-four Extraforaminal Microdiskectomy with Metrex  . SHOULDER OPEN ROTATOR CUFF REPAIR Bilateral 2001  . TONSILLECTOMY      There were no vitals filed for this  visit.  Subjective Assessment - 01/29/18 1417    Subjective  "The doctor wants to make sure I am not aspirating."    Special Tests  MBSS    Currently in Pain?  No/denies          General - 01/29/18 1417      General Information   Date of Onset  12/29/17    HPI  [name] is a 72 yo male who was referred for MBSS by Dr. Simonne Roberts to rule out silent aspiration due to questionable idiopathic pulmonary fibrosis. He was initially referred to Dr. Lake Roberts in November 2017 for evaluation of shortness of breath and found to have diffuse parenchymal lung disease of undetermined cause. He more recently has experienced worsening shortness of breath and has been on multiple rounds of prednisone. His case was taken to the interstitial lung disease conference and it was decided that Pt will undergo open lung biospy. Dr. Lake Roberts also ordered MBSS to attempt to rule out swallow dysfunction (aspiration) as part of his clinical picture. Pt tells SLP that he does not any trouble with swallowing, but does endorse shortness of breath with brief ambulation.     Type of Study  MBS-Modified Barium Swallow Study    Previous Swallow Assessment  None on record; BaSw 2017  Diet Prior to this Study  Regular;Thin liquids    Temperature Spikes Noted  No    Respiratory Status  Room air    History of Recent Intubation  No    Behavior/Cognition  Alert;Cooperative;Pleasant mood    Oral Cavity Assessment  Within Functional Limits    Oral Care Completed by SLP  No    Oral Cavity - Dentition  Adequate natural dentition    Vision  Functional for self feeding    Self-Feeding Abilities  Able to feed self    Patient Positioning  Upright in chair    Baseline Vocal Quality  Normal    Volitional Cough  Strong    Volitional Swallow  Able to elicit    Anatomy  Within functional limits   evidence of bony cervical prominence   Pharyngeal Secretions  Not observed secondary MBS         Oral Preparation/Oral  Phase - 01/29/18 1419      Oral Preparation/Oral Phase   Oral Phase  Within functional limits      Electrical stimulation - Oral Phase   Was Electrical Stimulation Used  No       Pharyngeal Phase - 01/29/18 1419      Pharyngeal Phase   Pharyngeal Phase  Within functional limits      Electrical Stimulation - Pharyngeal Phase   Was Electrical Stimulation Used  No       Cricopharyngeal Phase - 01/29/18 1419      Cervical Esophageal Phase   Cervical Esophageal Phase  Impaired      Cervical Esophageal Phase - Thin   Thin Cup  Prominent cricopharyngeal segment      Cervical Esophageal Phase - Comment   Cervical Esophageal Comment  Pt with prominent cricopharyngeus with mild/trace collection of liquids near CP, however no retrograde movement into pharynx/larynx noted.                Plan - 01/29/18 1421    Clinical Impression Statement  Pt assessed with barium tinged thin via tsp/cup/straw, puree, regular textures, and with barium tablet and demonstrated WNL oropharyngeal swallow function. Swallow initiation was timely, no penetration or aspiration observed, and no significant residuals noted post swallow. Pt was noted to have a prominent cricopharyngeus which can be seen in individuals with h/o GERD. Pt states that he takes Prilosec "only as needed". Trace collection of thin liquids noted near CP post swallow, but never observed movement back into pyriforms of into larynx. Unfortunately, the [facility] unit did not capture all images. Please refer to radiology report for additional information. Recommend regular textures and thin liquids with standard aspiration and reflux precautions. No further SLP services inidcated at this time. Pt in agreement with plan of care.     Potential to Achieve Goals  Good    Consulted and Agree with Plan of Care  Patient       Patient will benefit from skilled therapeutic intervention in order to improve the following deficits and impairments:    Other dysphagia    Recommendations/Treatment - 01/29/18 1420      Swallow Evaluation Recommendations   SLP Diet Recommendations  Thin;Age appropriate regular    Liquid Administration via  Cup;Straw    Medication Administration  Whole meds with liquid    Supervision  Patient able to self feed    Postural Changes  Seated upright at 90 degrees;Remain upright for at least 30 minutes after feeds/meals       Prognosis - 01/29/18  1420      Prognosis   Prognosis for Safe Diet Advancement  Good      Individuals Consulted   Consulted and Agree with Results and Recommendations  Patient    Report Sent to   Referring physician       Problem List Patient Active Problem List   Diagnosis Date Noted  . OSA (obstructive sleep apnea) 12/13/2016  . Snoring 10/26/2016  . PAF (paroxysmal atrial fibrillation) ([place]) 08/14/2016  . ILD (interstitial lung disease) ([place]) 05/01/2016  . Lumbar stenosis 02/01/2015  . Giardia 04/14/2014  . H/O viral cardiomyopathy 04/14/2014  . Herniated nucleus pulposus, L3-4 right 05/05/2013  . Other hypertrophic cardiomyopathy ([place]) 04/19/2013  . Hyperlipidemia 04/19/2013  . Essential hypertension 04/19/2013  . Abnormal LFTs 09/16/2012  . Nausea 07/10/2011  . Chronic diarrhea 07/10/2011  . Burping 07/10/2011  . Osteoporosis 07/10/2011  . Arthritis 07/10/2011   Thank you,  Genene Churn, [place]  Genene Churn 01/29/2018, 2:27 PM  [place] [address] [place], [place], [address] Phone: [phone]   Fax:  [phone]  Name: [name] MRN: [id number] Date of Birth: [birth year]

## 2018-02-04 ENCOUNTER — Ambulatory Visit: Payer: PPO | Admitting: Physician Assistant

## 2018-02-04 ENCOUNTER — Encounter: Payer: Self-pay | Admitting: Physician Assistant

## 2018-02-04 ENCOUNTER — Telehealth: Payer: Self-pay | Admitting: Pulmonary Disease

## 2018-02-04 VITALS — BP 126/77 | HR 71 | Ht 69.0 in | Wt 183.2 lb

## 2018-02-04 DIAGNOSIS — Z9989 Dependence on other enabling machines and devices: Secondary | ICD-10-CM | POA: Diagnosis not present

## 2018-02-04 DIAGNOSIS — I10 Essential (primary) hypertension: Secondary | ICD-10-CM | POA: Diagnosis not present

## 2018-02-04 DIAGNOSIS — E785 Hyperlipidemia, unspecified: Secondary | ICD-10-CM | POA: Diagnosis not present

## 2018-02-04 DIAGNOSIS — I480 Paroxysmal atrial fibrillation: Secondary | ICD-10-CM | POA: Diagnosis not present

## 2018-02-04 DIAGNOSIS — J849 Interstitial pulmonary disease, unspecified: Secondary | ICD-10-CM

## 2018-02-04 DIAGNOSIS — G4733 Obstructive sleep apnea (adult) (pediatric): Secondary | ICD-10-CM | POA: Diagnosis not present

## 2018-02-04 NOTE — Patient Instructions (Signed)
Medication Instructions:  Continue current medications  If you need a refill on your cardiac medications before your next appointment, please call your pharmacy.  Labwork: None Ordered   Testing/Procedures: None Ordered   Follow-Up: Your physician wants you to follow-up in: 3-4 Months with Dr Claiborne Billings.      Thank you for choosing [facility] at [facility]!!

## 2018-02-04 NOTE — Telephone Encounter (Signed)
Spoke with pt. He has an upcoming appointment with [name] on 02/07/18. Pt doesn't feel like he needs to keep this OV since he is going to see the thoracic surgeon. OV has been canceled per the pt's request. Nothing further was needed at this time.

## 2018-02-04 NOTE — Progress Notes (Signed)
Cardiology Office Note    Date:  02/04/2018   ID:  [name], DOB [birth year], MRN [id number]  PCP:  [name], MD  Cardiologist:  Dr. Claiborne Billings  Chief Complaint  Patient presents with  . Follow-up    seen for Dr. Claiborne Billings.     History of Present Illness:  [name] is a 72 y.o. male with PMH of viral myocarditis, hypertension, hyperlipidemia, polymyalgia rheumatica, paroxysmal atrial fibrillation, ILD and OSA on CPAP.  He suffered an episode of viral myocarditis in 1989, his ejection for restriction was 25% to time which normalized on subsequent check.  He also had a history of mitral valve prolapse.  Cardiac catheterization in 2001 showed only mild mid systolic LAD bridging.  He has been intolerant of Lipitor and Crestor due to myalgia.  In 2017, he developed new atrial fibrillation while vacationing at [facility].  I added diltiazem on top of his atenolol.  He was also placed on Xarelto at the time.  Echocardiogram in November 2017 showed EF 60 to 65%, grade 1 DD, mildly dilated aortic root measuring 38 mm.  Myoview showed normal EF without ischemia.  He is being followed by pulmonology service for interstitial show lung disease.  Most recent CT of the chest obtained on 12/06/2017 showed mild bilateral patchy groundglass opacities and subpleural reticulation, aortic atherosclerosis.  He was seen by pulmonology service on 12/16/2017 and was given a steroid taper for shortness of breath.  His systemic anticoagulation was recently switched to Eliquis due to cost concern.  I last saw the patient a month ago, he complained of occasional drop in the blood pressure down to the 80s with a heart rate up to 150s.  I suspect that he was in and out of atrial fibrillation.  The most likely culprit was the course of steroid therapy that he was taking at the time.  He presents today for follow-up.  He brought with him a list of blood pressure and heart rate reading.  His blood pressure has been  ranging in the 101-144 range.  Heart rate is in the 80s to low 90s.  He has been seen by pulmonology recently and was referred to cardiothoracic surgery for open lung biopsy to confirm the interstitial lung disease.  He says he would sometimes feel short of breath when he bends over.  I think he is very stable from cardiology perspective to proceed with lung biopsy.  He denies any chest pain or shortness of breath.  I am hesitant to increase his diltiazem at this time unless further recurrence of atrial fibrillation.  He has obtained a AliveCor and has been documenting his heart rhythm.  I have reviewed his tracing, all of which shows normal sinus rhythm with occasional PAC and PVC.  He can follow-up with Dr. Claiborne Billings in 3 to 33-month    Past Medical History:  Diagnosis Date  . Arthritis   . Chest pain    a. 03/2017: echo showing EF of 60-65%, no regional WMA or significant valve abnormalities. b. 03/2016: NST with no evidence of ischemia.   . Digestive problems    on Prednisone prn for this issue- diarrhea  . Dysrhythmia    irregular due to Myocarditis- takes Atenolol, Norvasc  . GERD (gastroesophageal reflux disease)   . H/O viral myocarditis    25 years ago  . Head injury, closed, with concussion    Breif LOC  . Hyperlipidemia   . Mild mitral valve prolapse  per Dr Evette Georges notes  . PAF (paroxysmal atrial fibrillation) ([place])    a. initially occuring in 02/2016. b. recurrent in 07/2016. Placed on Eliquis  . Polymyalgia rheumatica (HCC)     Past Surgical History:  Procedure Laterality Date  . apendectomy  1965  . APPENDECTOMY    . bone spur Bilateral 1999   feet  . CARDIAC CATHETERIZATION  2001  . CHOLECYSTECTOMY  2004  . COLONOSCOPY    . LUMBAR LAMINECTOMY/ DECOMPRESSION WITH MET-RX Right 05/05/2013   Procedure: Right Lumbar three-four Extraforaminal Microdiskectomy with Metrex;  Surgeon: [name], MD;  Location: [place] [facility];  Service: Neurosurgery;  Laterality: Right;  Right  Lumbar three-four Extraforaminal Microdiskectomy with Metrex  . SHOULDER OPEN ROTATOR CUFF REPAIR Bilateral 2001  . TONSILLECTOMY      Current Medications: Outpatient Medications Prior to Visit  Medication Sig Dispense Refill  . acetaminophen (TYLENOL) 500 MG tablet Take 500 mg by mouth every 6 (six) hours as needed for mild pain.     Marland Kitchen albuterol (PROVENTIL HFA;VENTOLIN HFA) 108 (90 Base) MCG/ACT inhaler     . CARTIA XT 240 MG 24 hr capsule TAKE ONE CAPSULE BY MOUTH DAILY 90 capsule 1  . cetirizine (ZYRTEC) 10 MG tablet Take 10 mg by mouth daily as needed for allergies.     . diphenoxylate-atropine (LOMOTIL) 2.5-0.025 MG tablet Take 2 tablets by mouth as needed for diarrhea or loose stools.    Marland Kitchen ELIQUIS 5 MG TABS tablet TAKE ONE TABLET (5MG TOTAL) BY MOUTH TWODAILY 180 tablet 1  . Methylsulfonylmethane (MSM) 1000 MG CAPS Take by mouth.    . Multiple Vitamins-Minerals (CENTRUM SILVER 50+MEN PO) Take 1 tablet by mouth daily.    . Omega-3 Fatty Acids (FISH OIL) 1000 MG CAPS take 2 capsules daily (Patient taking differently: Take 1,000 mg by mouth 2 (two) times daily. )    . ondansetron (ZOFRAN) 4 MG tablet Take 4 mg by mouth every 8 (eight) hours as needed for nausea or vomiting.    Marland Kitchen Spacer/Aero-Holding Chambers (AEROCHAMBER MV) inhaler Use as instructed 1 each 0  . tamsulosin (FLOMAX) 0.4 MG CAPS capsule Take 0.4 mg by mouth daily as needed.     . predniSONE (DELTASONE) 10 MG tablet Take 4 tabs PO daily x4 days, then take 3 tabs daily x4 days, then take 2 tabs daily x4 days, then take 1 tab daily 50 tablet 0  . ezetimibe (ZETIA) 10 MG tablet Take 1 tablet (10 mg total) by mouth daily. 90 tablet 3  . metFORMIN (GLUCOPHAGE) 500 MG tablet Take 1 tablet by mouth 2 (two) times daily.    . metoprolol tartrate (LOPRESSOR) 25 MG tablet Take 1 tablet (25 mg total) by mouth 2 (two) times daily. 180 tablet 3  . predniSONE (DELTASONE) 5 MG tablet Take 1 tablet by mouth daily.    . diphenoxylate-atropine  (LOMOTIL) 2.5-0.025 MG tablet TAKE TWO (2) TABLETS BY MOUTH EVERY MORNING (Patient taking differently: Take 2 tablets by mouth daily as needed. ) 60 tablet 0   No facility-administered medications prior to visit.      Allergies:   Lidocaine hcl; Xylocaine [lidocaine]; Codeine; Crestor [rosuvastatin calcium]; Percocet [oxycodone-acetaminophen]; and Statins   Social History   Socioeconomic History  . Marital status: Married    Spouse name: Not on file  . Number of children: Not on file  . Years of education: Not on file  . Highest education level: Not on file  Occupational History  . Not on  file  Social Needs  . Financial resource strain: Not on file  . Food insecurity:    Worry: Not on file    Inability: Not on file  . Transportation needs:    Medical: Not on file    Non-medical: Not on file  Tobacco Use  . Smoking status: Former Smoker    Packs/day: 0.02    Years: 1.00    Pack years: 0.02    Types: Cigarettes    Last attempt to quit: 07/10/1971    Years since quitting: 46.6  . Smokeless tobacco: Never Used  . Tobacco comment: Patient smoked 1/2 pack per week  Substance and Sexual Activity  . Alcohol use: Yes    Alcohol/week: 21.0 standard drinks    Types: 21 Glasses of wine per week    Comment: 3 glasses of wine nightly - 12/20/2016  . Drug use: No  . Sexual activity: Yes    Birth control/protection: Other-see comments    Comment: old age  Lifestyle  . Physical activity:    Days per week: Not on file    Minutes per session: Not on file  . Stress: Not on file  Relationships  . Social connections:    Talks on phone: Not on file    Gets together: Not on file    Attends religious service: Not on file    Active member of club or organization: Not on file    Attends meetings of clubs or organizations: Not on file    Relationship status: Not on file  Other Topics Concern  . Not on file  Social History Narrative  . Not on file     Family History:  The patient's  family history includes Asthma in his father; Heart disease in his father; Melanoma in his brother; Rheum arthritis in his mother; Thyroid cancer in his mother and sister.   ROS:   Please see the history of present illness.    ROS All other systems reviewed and are negative.   PHYSICAL EXAM:   VS:  BP 126/77   Pulse 71   Ht '5\' 9"'$  (1.753 m)   Wt 183 lb 3.2 oz (83.1 kg)   BMI 27.05 kg/m    GEN: Well nourished, well developed, in no acute distress  HEENT: normal  Neck: no JVD, carotid bruits, or masses Cardiac: RRR; no murmurs, rubs, or gallops,no edema  Respiratory:  clear to auscultation bilaterally, normal work of breathing GI: soft, nontender, nondistended, + BS MS: no deformity or atrophy  Skin: warm and dry, no rash Neuro:  Alert and Oriented x 3, Strength and sensation are intact Psych: euthymic mood, full affect  Wt Readings from Last 3 Encounters:  02/04/18 183 lb 3.2 oz (83.1 kg)  01/10/18 179 lb (81.2 kg)  12/19/17 183 lb (83 kg)      Studies/Labs Reviewed:   EKG:  EKG is not ordered today.   Recent Labs: 12/16/2017: BUN 11; Creatinine, Ser 0.88; Hemoglobin 15.9; Platelets 216.0; Potassium 3.9; Sodium 137   Lipid Panel    Component Value Date/Time   CHOL 222 (H) 07/16/2016 0902   TRIG 359 (H) 07/16/2016 0902   HDL 67 07/16/2016 0902   CHOLHDL 3.3 07/16/2016 0902   VLDL 72 (H) 07/16/2016 0902   LDLCALC 83 07/16/2016 0902    Additional studies/ records that were reviewed today include:   Myoview 03/21/2016 Study Highlights    The left ventricular ejection fraction is normal (55-65%).  Nuclear stress EF: 61%.  The  study is normal.  This is a low risk study.  There was no ST segment deviation noted during stress.      Echo 03/22/2016 LV EF: 60% -   65% Study Conclusions  - Left ventricle: The cavity size was normal. Wall thickness was   normal. Systolic function was normal. The estimated ejection   fraction was in the range of 60% to 65%.  Wall motion was normal;   there were no regional wall motion abnormalities. Doppler   parameters are consistent with abnormal left ventricular   relaxation (grade 1 diastolic dysfunction). - Aortic valve: There was no stenosis. - Aorta: Mildly dilated aortic root. Aortic root dimension: 38 mm   (ED). - Mitral valve: There was no significant regurgitation. - Right ventricle: The cavity size was normal. Systolic function   was normal. - Pulmonary arteries: No complete TR doppler jet so unable to   estimate PA systolic pressure. - Inferior vena cava: The vessel was normal in size. The   respirophasic diameter changes were in the normal range (>= 50%),   consistent with normal central venous pressure.  Impressions:  - Normal LV size with EF 60-65%. Normal RV size and systolic   function. No significant valvular abnormalities.    ASSESSMENT:    1. PAF (paroxysmal atrial fibrillation) ([place])   2. ILD (interstitial lung disease) ([place])   3. Essential hypertension   4. Hyperlipidemia, unspecified hyperlipidemia type   5. OSA on CPAP      PLAN:  In order of problems listed above:  1. Paroxysmal atrial fibrillation: He has been monitoring for recurrence using a AliveCor.  Previous episode occurred in the setting of steroid use for lung disease.  No further recurrence since the last office visit  2. Interstitial lung disease: Patient has been referred to cardiothoracic surgery for possible open lung biopsy.  He is very much stable from cardiology perspective.  3. Hypertension: Blood pressure stable  4. Hyperlipidemia: Continue Zetia and fish oil.  He is due to have repeat fasting lipid panel and LFT on the next visit.  Last cholesterol check was in early 2018.    Medication Adjustments/Labs and Tests Ordered: Current medicines are reviewed at length with the patient today.  Concerns regarding medicines are outlined above.  Medication changes, Labs and Tests ordered today are  listed in the Patient Instructions below. Patient Instructions  Medication Instructions:  Continue current medications  If you need a refill on your cardiac medications before your next appointment, please call your pharmacy.  Labwork: None Ordered   Testing/Procedures: None Ordered   Follow-Up: Your physician wants you to follow-up in: 3-4 Months with Dr Claiborne Billings.      Thank you for choosing [facility] at [employer], [place]  02/04/2018 8:58 AM    [address], [place], [place]  [id number] Phone: [phone]; Fax: [phone]

## 2018-02-07 ENCOUNTER — Ambulatory Visit: Payer: PPO | Admitting: Pulmonary Disease

## 2018-02-11 ENCOUNTER — Encounter: Payer: Self-pay | Admitting: Thoracic Surgery (Cardiothoracic Vascular Surgery)

## 2018-02-11 ENCOUNTER — Institutional Professional Consult (permissible substitution): Payer: PPO | Admitting: Thoracic Surgery (Cardiothoracic Vascular Surgery)

## 2018-02-11 ENCOUNTER — Other Ambulatory Visit: Payer: Self-pay | Admitting: *Deleted

## 2018-02-11 ENCOUNTER — Other Ambulatory Visit: Payer: Self-pay

## 2018-02-11 ENCOUNTER — Encounter: Payer: Self-pay | Admitting: *Deleted

## 2018-02-11 VITALS — BP 125/75 | HR 83 | Resp 16 | Ht 69.0 in | Wt 183.0 lb

## 2018-02-11 DIAGNOSIS — J849 Interstitial pulmonary disease, unspecified: Secondary | ICD-10-CM

## 2018-02-11 NOTE — H&P (View-Only) (Signed)
PCP is [name], MD Referring Provider is Juanito Doom, MD  Chief Complaint  Patient presents with  . Interstitial Lung Disease    Surgical eval for OPEN LUNG BX.Marland KitchenMarland KitchenChest CT 12/06/17, PFT's 01/10/18.Marland KitchenMarland KitchenCARDIAC CLEARANCE per HAO MENG, PA    HPI: [name] is sent for consideration for a lung biopsy.  [name] is a 72 year old man with a past medical history significant for paroxysmal atrial fibrillation, viral myocarditis, polymyalgia rheumatica, gastroesophageal reflux and hyperlipidemia.  He had a history of minimal tobacco use many years ago.  He also had some brief asbestos exposure.  He has been having issues with shortness of breath over the past couple years but his symptoms have progressed over the past 4 months.  His shortness of breath is particularly severe when he goes up an incline or walks up a flight of stairs.  He can only walk about half a flight before having to stop and catch his breath.  He has been treated with multiple rounds of antibiotics and prednisone.  His symptoms improve each time but since he is off the prednisone his symptoms recur.   Past Medical History:  Diagnosis Date  . Arthritis   . Chest pain    a. 03/2017: echo showing EF of 60-65%, no regional WMA or significant valve abnormalities. b. 03/2016: NST with no evidence of ischemia.   . Digestive problems    on Prednisone prn for this issue- diarrhea  . Dysrhythmia    irregular due to Myocarditis- takes Atenolol, Norvasc  . GERD (gastroesophageal reflux disease)   . H/O viral myocarditis    25 years ago  . Head injury, closed, with concussion    Breif LOC  . Hyperlipidemia   . Mild mitral valve prolapse    per Dr Evette Georges notes  . PAF (paroxysmal atrial fibrillation) ([place])    a. initially occuring in 02/2016. b. recurrent in 07/2016. Placed on Eliquis  . Polymyalgia rheumatica (HCC)     Past Surgical History:  Procedure Laterality Date  . apendectomy  1965  . APPENDECTOMY    . bone  spur Bilateral 1999   feet  . CARDIAC CATHETERIZATION  2001  . CHOLECYSTECTOMY  2004  . COLONOSCOPY    . LUMBAR LAMINECTOMY/ DECOMPRESSION WITH MET-RX Right 05/05/2013   Procedure: Right Lumbar three-four Extraforaminal Microdiskectomy with Metrex;  Surgeon: [name], MD;  Location: [place] [facility];  Service: Neurosurgery;  Laterality: Right;  Right Lumbar three-four Extraforaminal Microdiskectomy with Metrex  . SHOULDER OPEN ROTATOR CUFF REPAIR Bilateral 2001  . TONSILLECTOMY      Family History  Problem Relation Age of Onset  . Rheum arthritis Mother   . Thyroid cancer Mother   . Heart disease Father   . Asthma Father   . Thyroid cancer Sister   . Melanoma Brother     Social History Social History   Tobacco Use  . Smoking status: Former Smoker    Packs/day: 0.02    Years: 1.00    Pack years: 0.02    Types: Cigarettes    Last attempt to quit: 07/10/1971    Years since quitting: 46.6  . Smokeless tobacco: Never Used  . Tobacco comment: Patient smoked 1/2 pack per week  Substance Use Topics  . Alcohol use: Yes    Alcohol/week: 21.0 standard drinks    Types: 21 Glasses of wine per week    Comment: 3 glasses of wine nightly - 12/20/2016  . Drug use: No    Current  Outpatient Medications  Medication Sig Dispense Refill  . acetaminophen (TYLENOL) 500 MG tablet Take 500 mg by mouth every 6 (six) hours as needed for mild pain.     Marland Kitchen albuterol (PROVENTIL HFA;VENTOLIN HFA) 108 (90 Base) MCG/ACT inhaler 2 puffs every 4 (four) hours as needed.     Marland Kitchen CARTIA XT 240 MG 24 hr capsule TAKE ONE CAPSULE BY MOUTH DAILY 90 capsule 1  . cetirizine (ZYRTEC) 10 MG tablet Take 10 mg by mouth daily as needed for allergies.     . diphenoxylate-atropine (LOMOTIL) 2.5-0.025 MG tablet Take 2 tablets by mouth as needed for diarrhea or loose stools.    Marland Kitchen ELIQUIS 5 MG TABS tablet TAKE ONE TABLET (5MG TOTAL) BY MOUTH TWODAILY (Patient taking differently: 5 mg 2 (two) times daily. ) 180 tablet 1  .  metFORMIN (GLUCOPHAGE) 500 MG tablet Take 1 tablet by mouth 2 (two) times daily.    . Methylsulfonylmethane (MSM) 1000 MG CAPS Take by mouth.    . metoprolol tartrate (LOPRESSOR) 25 MG tablet Take 1 tablet (25 mg total) by mouth 2 (two) times daily. 180 tablet 3  . Multiple Vitamins-Minerals (CENTRUM SILVER 50+MEN PO) Take 1 tablet by mouth daily.    . Omega-3 Fatty Acids (FISH OIL) 1000 MG CAPS take 2 capsules daily (Patient taking differently: Take 1,000 mg by mouth 2 (two) times daily. )    . ondansetron (ZOFRAN) 4 MG tablet Take 4 mg by mouth every 8 (eight) hours as needed for nausea or vomiting.    . predniSONE (DELTASONE) 5 MG tablet Take 1 tablet by mouth daily.    Marland Kitchen Spacer/Aero-Holding Chambers (AEROCHAMBER MV) inhaler Use as instructed 1 each 0  . tamsulosin (FLOMAX) 0.4 MG CAPS capsule Take 0.4 mg by mouth daily. TAKE 2 TABLETS DAILY    . ezetimibe (ZETIA) 10 MG tablet Take 1 tablet (10 mg total) by mouth daily. 90 tablet 3   No current facility-administered medications for this visit.     Allergies  Allergen Reactions  . Lidocaine Hcl Anaphylaxis    Xylocaine  . Xylocaine [Lidocaine] Anaphylaxis  . Codeine Nausea And Vomiting  . Crestor [Rosuvastatin Calcium] Other (See Comments)    All-over body aches  . Percocet [Oxycodone-Acetaminophen] Itching  . Statins Other (See Comments)    Muscle soreness and an aching feeling all over    Review of Systems  Constitutional: Positive for fatigue. Negative for activity change and unexpected weight change.  HENT: Positive for hearing loss. Negative for trouble swallowing and voice change.   Eyes: Negative for visual disturbance.  Respiratory: Positive for cough and shortness of breath. Negative for wheezing.   Cardiovascular: Positive for leg swelling. Negative for chest pain.  Gastrointestinal: Positive for diarrhea. Negative for abdominal pain.  Genitourinary: Negative for difficulty urinating and dysuria.  Musculoskeletal:  Positive for arthralgias and joint swelling.  Neurological: Negative for seizures, syncope and weakness.  Hematological: Negative for adenopathy. Bruises/bleeds easily.  All other systems reviewed and are negative.   BP 125/75 (BP Location: Right Arm, Patient Position: Sitting, Cuff Size: Large)   Pulse 83   Resp 16   Ht '5\' 9"'  (1.753 m)   Wt 183 lb (83 kg)   SpO2 94% Comment: RA  BMI 27.02 kg/m  Physical Exam  Constitutional: He is oriented to person, place, and time. He appears well-developed and well-nourished.  HENT:  Head: Normocephalic and atraumatic.  Mouth/Throat: No oropharyngeal exudate.  Eyes: Conjunctivae and EOM are normal. No scleral icterus.  Neck: No thyromegaly present.  Cardiovascular: Normal rate, regular rhythm and normal heart sounds.  No murmur heard. Pulmonary/Chest: Effort normal and breath sounds normal. No respiratory distress. He has no wheezes. He has no rales.  Abdominal: Soft. He exhibits no distension. There is no tenderness.  Musculoskeletal: He exhibits no deformity.  Lymphadenopathy:    He has no cervical adenopathy.  Neurological: He is alert and oriented to person, place, and time. No cranial nerve deficit. He exhibits normal muscle tone. Coordination normal.  Skin: Skin is warm and dry.  Vitals reviewed.    Diagnostic Tests: CT CHEST WITHOUT CONTRAST  TECHNIQUE: Multidetector CT imaging of the chest was performed following the standard protocol without IV contrast.  COMPARISON:  Chest radiograph 11/26/2017, chest CT 06/13/2016  FINDINGS: Cardiovascular: Normal heart size. No pericardial effusion. Mild calcific atherosclerotic disease of the aorta and coronary arteries.  Mediastinum/Nodes: No enlarged mediastinal or axillary lymph nodes. Thyroid gland, trachea, and esophagus demonstrate no significant findings.  Lungs/Pleura: Mild bilateral patchy ground-glass opacities and subpleural reticulation, with lower lobe  predominance. No evidence of bronchiectasis, or honeycombing.  Upper Abdomen: No acute abnormality.  Postcholecystectomy.  Musculoskeletal: No chest wall mass or suspicious bone lesions identified. Spondylosis of the visualized spine.  IMPRESSION: Mild bilateral patchy ground-glass opacities and subpleural reticulation, with lower lobe predominance. Nonspecific findings which may be seen with atypical pneumonia, or interstitial pneumonitis.  Aortic Atherosclerosis (ICD10-I70.0).   Electronically Signed   By: Fidela Salisbury M.D.   On: 12/07/2017 07:43  I personally reviewed the CT images and concur with the findings noted above  Impression: Mr. Damiano is a 72 year old gentleman with a history of paroxysmal atrial fibrillation, remote viral myocarditis, polymyalgia rheumatica, and reflux.  He has fibrosing interstitial lung disease.  [name] feels a lung biopsy is necessary to define this as to whether might be NSIP or hypersensitivity pneumonitis.  The biopsy will guide treatment and provide prognostic information.  I described the proposed procedure to Mr. and Mrs. Shuck.  They understand that this is a major operative procedure it would be done in the operating room under general anesthesia.  We discussed the incisions to be used in the need for drainage tube postoperatively.  He will likely be in the hospital 3 to 4 days postoperatively.  They understand this is diagnostic and not therapeutic.  I informed them of the indications, risks, benefits, and alternatives.  They understand the risks include, but not limited to death, MI, DVT, PE, bleeding, possible need for transfusion, infection, prolonged air leak, cardiac arrhythmias, as well as possibility of other unforeseeable complications.  He wishes to proceed as soon as possible  Plan: Right VATS for lung biopsy on Wednesday, 02/19/2018 Hold apixaban for 2 days prior to procedure.  [name],  MD [facility] [phone]

## 2018-02-11 NOTE — Patient Instructions (Signed)
Hold Eliquis for 2 days prior to surgery

## 2018-02-11 NOTE — Progress Notes (Signed)
PCP is [name], MD Referring Provider is Juanito Doom, MD  Chief Complaint  Patient presents with  . Interstitial Lung Disease    Surgical eval for OPEN LUNG BX.Marland KitchenMarland KitchenChest CT 12/06/17, PFT's 01/10/18.Marland KitchenMarland KitchenCARDIAC CLEARANCE per HAO MENG, PA    HPI: [name] is sent for consideration for a lung biopsy.  [name] is a 72 year old man with a past medical history significant for paroxysmal atrial fibrillation, viral myocarditis, polymyalgia rheumatica, gastroesophageal reflux and hyperlipidemia.  [name] had a history of minimal tobacco use many years ago.  [name] also had some brief asbestos exposure.  [name] has been having issues with shortness of breath over the past couple years but his symptoms have progressed over the past 4 months.  His shortness of breath is particularly severe when [name] goes up an incline or walks up a flight of stairs.  [name] can only walk about half a flight before having to stop and catch his breath.  [name] has been treated with multiple rounds of antibiotics and prednisone.  His symptoms improve each time but since [name] is off the prednisone his symptoms recur.   Past Medical History:  Diagnosis Date  . Arthritis   . Chest pain    a. 03/2017: echo showing EF of 60-65%, no regional WMA or significant valve abnormalities. b. 03/2016: NST with no evidence of ischemia.   . Digestive problems    on Prednisone prn for this issue- diarrhea  . Dysrhythmia    irregular due to Myocarditis- takes Atenolol, Norvasc  . GERD (gastroesophageal reflux disease)   . H/O viral myocarditis    25 years ago  . Head injury, closed, with concussion    Breif LOC  . Hyperlipidemia   . Mild mitral valve prolapse    per Dr Evette Georges notes  . PAF (paroxysmal atrial fibrillation) ([place])    a. initially occuring in 02/2016. b. recurrent in 07/2016. Placed on Eliquis  . Polymyalgia rheumatica ([facility])     Past Surgical History:  Procedure Laterality Date  . apendectomy  1965  . APPENDECTOMY    . bone  spur Bilateral 1999   feet  . CARDIAC CATHETERIZATION  2001  . CHOLECYSTECTOMY  2004  . COLONOSCOPY    . LUMBAR LAMINECTOMY/ DECOMPRESSION WITH MET-RX Right 05/05/2013   Procedure: Right Lumbar three-four Extraforaminal Microdiskectomy with Metrex;  Surgeon: [name], MD;  Location: [facility];  Service: Neurosurgery;  Laterality: Right;  Right Lumbar three-four Extraforaminal Microdiskectomy with Metrex  . SHOULDER OPEN ROTATOR CUFF REPAIR Bilateral 2001  . TONSILLECTOMY      Family History  Problem Relation Age of Onset  . Rheum arthritis Mother   . Thyroid cancer Mother   . Heart disease Father   . Asthma Father   . Thyroid cancer Sister   . Melanoma Brother     Social History Social History   Tobacco Use  . Smoking status: Former Smoker    Packs/day: 0.02    Years: 1.00    Pack years: 0.02    Types: Cigarettes    Last attempt to quit: 07/10/1971    Years since quitting: 46.6  . Smokeless tobacco: Never Used  . Tobacco comment: Patient smoked 1/2 pack per week  Substance Use Topics  . Alcohol use: Yes    Alcohol/week: 21.0 standard drinks    Types: 21 Glasses of wine per week    Comment: 3 glasses of wine nightly - 12/20/2016  . Drug use: No    Current  Outpatient Medications  Medication Sig Dispense Refill  . acetaminophen (TYLENOL) 500 MG tablet Take 500 mg by mouth every 6 (six) hours as needed for mild pain.     Marland Kitchen albuterol (PROVENTIL HFA;VENTOLIN HFA) 108 (90 Base) MCG/ACT inhaler 2 puffs every 4 (four) hours as needed.     Marland Kitchen CARTIA XT 240 MG 24 hr capsule TAKE ONE CAPSULE BY MOUTH DAILY 90 capsule 1  . cetirizine (ZYRTEC) 10 MG tablet Take 10 mg by mouth daily as needed for allergies.     . diphenoxylate-atropine (LOMOTIL) 2.5-0.025 MG tablet Take 2 tablets by mouth as needed for diarrhea or loose stools.    Marland Kitchen ELIQUIS 5 MG TABS tablet TAKE ONE TABLET (5MG TOTAL) BY MOUTH TWODAILY (Patient taking differently: 5 mg 2 (two) times daily. ) 180 tablet 1  .  metFORMIN (GLUCOPHAGE) 500 MG tablet Take 1 tablet by mouth 2 (two) times daily.    . Methylsulfonylmethane (MSM) 1000 MG CAPS Take by mouth.    . metoprolol tartrate (LOPRESSOR) 25 MG tablet Take 1 tablet (25 mg total) by mouth 2 (two) times daily. 180 tablet 3  . Multiple Vitamins-Minerals (CENTRUM SILVER 50+MEN PO) Take 1 tablet by mouth daily.    . Omega-3 Fatty Acids (FISH OIL) 1000 MG CAPS take 2 capsules daily (Patient taking differently: Take 1,000 mg by mouth 2 (two) times daily. )    . ondansetron (ZOFRAN) 4 MG tablet Take 4 mg by mouth every 8 (eight) hours as needed for nausea or vomiting.    . predniSONE (DELTASONE) 5 MG tablet Take 1 tablet by mouth daily.    Marland Kitchen Spacer/Aero-Holding Chambers (AEROCHAMBER MV) inhaler Use as instructed 1 each 0  . tamsulosin (FLOMAX) 0.4 MG CAPS capsule Take 0.4 mg by mouth daily. TAKE 2 TABLETS DAILY    . ezetimibe (ZETIA) 10 MG tablet Take 1 tablet (10 mg total) by mouth daily. 90 tablet 3   No current facility-administered medications for this visit.     Allergies  Allergen Reactions  . Lidocaine Hcl Anaphylaxis    Xylocaine  . Xylocaine [Lidocaine] Anaphylaxis  . Codeine Nausea And Vomiting  . Crestor [Rosuvastatin Calcium] Other (See Comments)    All-over body aches  . Percocet [Oxycodone-Acetaminophen] Itching  . Statins Other (See Comments)    Muscle soreness and an aching feeling all over    Review of Systems  Constitutional: Positive for fatigue. Negative for activity change and unexpected weight change.  HENT: Positive for hearing loss. Negative for trouble swallowing and voice change.   Eyes: Negative for visual disturbance.  Respiratory: Positive for cough and shortness of breath. Negative for wheezing.   Cardiovascular: Positive for leg swelling. Negative for chest pain.  Gastrointestinal: Positive for diarrhea. Negative for abdominal pain.  Genitourinary: Negative for difficulty urinating and dysuria.  Musculoskeletal:  Positive for arthralgias and joint swelling.  Neurological: Negative for seizures, syncope and weakness.  Hematological: Negative for adenopathy. Bruises/bleeds easily.  All other systems reviewed and are negative.   BP 125/75 (BP Location: Right Arm, Patient Position: Sitting, Cuff Size: Large)   Pulse 83   Resp 16   Ht '5\' 9"'  (1.753 m)   Wt 183 lb (83 kg)   SpO2 94% Comment: RA  BMI 27.02 kg/m  Physical Exam  Constitutional: [name] is oriented to person, place, and time. [name] appears well-developed and well-nourished.  HENT:  Head: Normocephalic and atraumatic.  Mouth/Throat: No oropharyngeal exudate.  Eyes: Conjunctivae and EOM are normal. No scleral icterus.  Neck: No thyromegaly present.  Cardiovascular: Normal rate, regular rhythm and normal heart sounds.  No murmur heard. Pulmonary/Chest: Effort normal and breath sounds normal. No respiratory distress. [name] has no wheezes. [name] has no rales.  Abdominal: Soft. [name] exhibits no distension. There is no tenderness.  Musculoskeletal: [name] exhibits no deformity.  Lymphadenopathy:    [name] has no cervical adenopathy.  Neurological: [name] is alert and oriented to person, place, and time. No cranial nerve deficit. [name] exhibits normal muscle tone. Coordination normal.  Skin: Skin is warm and dry.  Vitals reviewed.    Diagnostic Tests: CT CHEST WITHOUT CONTRAST  TECHNIQUE: Multidetector CT imaging of the chest was performed following the standard protocol without IV contrast.  COMPARISON:  Chest radiograph 11/26/2017, chest CT 06/13/2016  FINDINGS: Cardiovascular: Normal heart size. No pericardial effusion. Mild calcific atherosclerotic disease of the aorta and coronary arteries.  Mediastinum/Nodes: No enlarged mediastinal or axillary lymph nodes. Thyroid gland, trachea, and esophagus demonstrate no significant findings.  Lungs/Pleura: Mild bilateral patchy ground-glass opacities and subpleural reticulation, with lower lobe  predominance. No evidence of bronchiectasis, or honeycombing.  Upper Abdomen: No acute abnormality.  Postcholecystectomy.  Musculoskeletal: No chest wall mass or suspicious bone lesions identified. Spondylosis of the visualized spine.  IMPRESSION: Mild bilateral patchy ground-glass opacities and subpleural reticulation, with lower lobe predominance. Nonspecific findings which may be seen with atypical pneumonia, or interstitial pneumonitis.  Aortic Atherosclerosis (ICD10-I70.0).   Electronically Signed   By: Fidela Salisbury M.D.   On: 12/07/2017 07:43  I personally reviewed the CT images and concur with the findings noted above  Impression: [name] is a 72 year old gentleman with a history of paroxysmal atrial fibrillation, remote viral myocarditis, polymyalgia rheumatica, and reflux.  [name] has fibrosing interstitial lung disease.  [name] feels a lung biopsy is necessary to define this as to whether might be NSIP or hypersensitivity pneumonitis.  The biopsy will guide treatment and provide prognostic information.  I described the proposed procedure to Mr. and Mrs. Marcano.  They understand that this is a major operative procedure it would be done in the operating room under general anesthesia.  We discussed the incisions to be used in the need for drainage tube postoperatively.  [name] will likely be in the hospital 3 to 4 days postoperatively.  They understand this is diagnostic and not therapeutic.  I informed them of the indications, risks, benefits, and alternatives.  They understand the risks include, but not limited to death, MI, DVT, PE, bleeding, possible need for transfusion, infection, prolonged air leak, cardiac arrhythmias, as well as possibility of other unforeseeable complications.  [name] wishes to proceed as soon as possible  Plan: Right VATS for lung biopsy on Wednesday, 02/19/2018 Hold apixaban for 2 days prior to procedure.  [name],  MD [facility] [phone]

## 2018-02-14 NOTE — Pre-Procedure Instructions (Signed)
[name]  02/14/2018      [place], [address] [place]  [address] Phone: [phone] Fax: [phone]    Your procedure is scheduled on 02-19-2018  Wednesday .  Report to [facility] Admitting at 6:30 A.M.   Call this number if you have problems the morning of surgery:  [phone]   Remember:  Do not eat food or drink liquids after midnight.       Take these medicines the morning of surgery with A SIP OF WATER  Tylenol if needed Albuterol inhaler if needed. Bring inhaler to hospital day of surgery Cartia Ezetimibe(Zetia) Flonase nasal spray if needed Metoprolo(Lopressor) Prednisone Tamsulosin(Flomax)  Follow surgeon instructions on when to stop Eliquis,if no instructions given call the office for instructions.  STOP TAKING ANY ASPIRIN (UNLESS OTHERWISE INSTRUCTED BY YOUR SURGEON),ANTIINFLAMATORIES (IBUPROFEN,ALEVE,MOTRIN,ADVIL,GOODY'S POWDERS),HERBAL SUPPLEMENTS,FISH OIL,AND VITAMINS 5-7 DAYS PRIOR TO SURGERY     How to Manage Your Diabetes Before and After Surgery  Why is it important to control my blood sugar before and after surgery? . Improving blood sugar levels before and after surgery helps healing and can limit problems. . A way of improving blood sugar control is eating a healthy diet by: o  Eating less sugar and carbohydrates o  Increasing activity/exercise o  Talking with your doctor about reaching your blood sugar goals . High blood sugars (greater than 180 mg/dL) can raise your risk of infections and slow your recovery, so you will need to focus on controlling your diabetes during the weeks before surgery. . Make sure that the doctor who takes care of your diabetes knows about your planned surgery including the date and location.  How do I manage my blood sugar before surgery? . Check your blood sugar at least 4 times a day, starting 2 days before surgery, to make sure that  the level is not too high or low. o Check your blood sugar the morning of your surgery when you wake up and every 2 hours until you get to the Short Stay unit. . If your blood sugar is less than 70 mg/dL, you will need to treat for low blood sugar: o Do not take insulin. o Treat a low blood sugar (less than 70 mg/dL) with  cup of clear juice (cranberry or apple), 4 glucose tablets, OR glucose gel. Recheck blood sugar in 15 minutes after treatment (to make sure it is greater than 70 mg/dL). If your blood sugar is not greater than 70 mg/dL on recheck, call [phone] o  for further instructions. . Report your blood sugar to the short stay nurse when you get to Short Stay.  . If you are admitted to the hospital after surgery: o Your blood sugar will be checked by the staff and you will probably be given insulin after surgery (instead of oral diabetes medicines) to make sure you have good blood sugar levels. o The goal for blood sugar control after surgery is 80-180 mg/dL.              WHAT DO I DO ABOUT MY DIABETES MEDICATION?   Marland Kitchen Do not take oral diabetes medicines (pills) the morning of surgery.    [facility] - Preparing for Surgery  Before surgery, you can play an important role.  Because skin is not sterile, your skin needs to be as free of germs as possible.  You can reduce the number of germs on  you skin by washing with CHG (chlorahexidine gluconate) soap before surgery.  CHG is an antiseptic cleaner which kills germs and bonds with the skin to continue killing germs even after washing.  Oral Hygiene is also important in reducing the risk of infection.  Remember to brush your teeth with your regular toothpaste the morning of surgery.  Please DO NOT use if you have an allergy to CHG or antibacterial soaps.  If your skin becomes reddened/irritated stop using the CHG and inform your nurse when you arrive at Short Stay.  Do not shave (including legs and underarms) for at least  48 hours prior to the first CHG shower.  You may shave your face.  Please follow these instructions carefully:   1.  Shower with CHG Soap the night before surgery and the morning of Surgery.  2.  If you choose to wash your hair, wash your hair first as usual with your normal shampoo.  3.  After you shampoo, rinse your hair and body thoroughly to remove the shampoo. 4.  Use CHG as you would any other liquid soap.  You can apply chg directly to the skin and wash gently with a      scrungie or washcloth.           5.  Apply the CHG Soap to your body ONLY FROM THE NECK DOWN.   Do not use on open wounds or open sores. Avoid contact with your eand genitals (private parts).  Wash genitals (private parts) with your normal soap.  6.  Wash thoroughly, paying special attention to the area where your surgery will be performed.  7.  Thoroughly rinse your body with warm water from the neck down.  8.  DO NOT shower/wash with your normal soap after using and rinsing off the CHG Soap.  9.  Pat yourself dry with a clean towel.            10.  Wear clean pajamas.            11.  Place clean sheets on your bed the night of your first shower and do not sleep with pets.  Day of Surgery  Do not apply any lotions/deoderants the morning of surgery.   Please wear clean clothes to the hospital/surgery center. Remember to brush your teeth with toothpaste.         Date:   :  Date:   Reviewed and Endorsed by [facility] Patient Education Committee, August 2015      Do not wear jewelry, make-up or nail polish.  Do not wear lotions, powders, or perfumes, or deodorant.  Do not shave 48 hours prior to surgery.  Men may shave face and neck.  Do not bring valuables to the hospital.  [facility] is not responsible for any belongings or valuables.  Contacts, dentures or bridgework may not be worn into surgery.  Leave your suitcase in the car.  After surgery it may be brought to your room.  For patients admitted  to the hospital, discharge time will be determined by your treatment team.  Patients discharged the day of surgery will not be allowed to drive home.      Please read over the following fact sheets that you were given. Pain Booklet, Coughing and Deep Breathing and Surgical Site Infection Prevention

## 2018-02-17 ENCOUNTER — Encounter (HOSPITAL_COMMUNITY): Payer: Self-pay

## 2018-02-17 ENCOUNTER — Ambulatory Visit (HOSPITAL_COMMUNITY)
Admission: RE | Admit: 2018-02-17 | Discharge: 2018-02-17 | Disposition: A | Discharge status: Home / Self Care | Payer: PPO | Source: Ambulatory Visit | Attending: Thoracic Surgery (Cardiothoracic Vascular Surgery) | Admitting: Thoracic Surgery (Cardiothoracic Vascular Surgery)

## 2018-02-17 ENCOUNTER — Encounter (HOSPITAL_COMMUNITY)
Admission: RE | Admit: 2018-02-17 | Discharge: 2018-02-17 | Disposition: A | Discharge status: Home / Self Care | Payer: PPO | Source: Ambulatory Visit | Attending: Thoracic Surgery (Cardiothoracic Vascular Surgery) | Admitting: Thoracic Surgery (Cardiothoracic Vascular Surgery)

## 2018-02-17 ENCOUNTER — Other Ambulatory Visit: Payer: Self-pay

## 2018-02-17 DIAGNOSIS — I10 Essential (primary) hypertension: Secondary | ICD-10-CM | POA: Diagnosis not present

## 2018-02-17 DIAGNOSIS — Z7901 Long term (current) use of anticoagulants: Secondary | ICD-10-CM | POA: Diagnosis not present

## 2018-02-17 DIAGNOSIS — S060X9A Concussion with loss of consciousness of unspecified duration, initial encounter: Secondary | ICD-10-CM | POA: Diagnosis present

## 2018-02-17 DIAGNOSIS — J8410 Pulmonary fibrosis, unspecified: Secondary | ICD-10-CM | POA: Diagnosis not present

## 2018-02-17 DIAGNOSIS — J849 Interstitial pulmonary disease, unspecified: Secondary | ICD-10-CM | POA: Diagnosis present

## 2018-02-17 DIAGNOSIS — Z4682 Encounter for fitting and adjustment of non-vascular catheter: Secondary | ICD-10-CM | POA: Diagnosis not present

## 2018-02-17 DIAGNOSIS — Z7952 Long term (current) use of systemic steroids: Secondary | ICD-10-CM | POA: Insufficient documentation

## 2018-02-17 DIAGNOSIS — Z77090 Contact with and (suspected) exposure to asbestos: Secondary | ICD-10-CM | POA: Diagnosis present

## 2018-02-17 DIAGNOSIS — Z888 Allergy status to other drugs, medicaments and biological substances status: Secondary | ICD-10-CM | POA: Diagnosis not present

## 2018-02-17 DIAGNOSIS — E785 Hyperlipidemia, unspecified: Secondary | ICD-10-CM

## 2018-02-17 DIAGNOSIS — K219 Gastro-esophageal reflux disease without esophagitis: Secondary | ICD-10-CM

## 2018-02-17 DIAGNOSIS — Z885 Allergy status to narcotic agent status: Secondary | ICD-10-CM | POA: Diagnosis not present

## 2018-02-17 DIAGNOSIS — G4730 Sleep apnea, unspecified: Secondary | ICD-10-CM

## 2018-02-17 DIAGNOSIS — Z01818 Encounter for other preprocedural examination: Secondary | ICD-10-CM

## 2018-02-17 DIAGNOSIS — Z79899 Other long term (current) drug therapy: Secondary | ICD-10-CM

## 2018-02-17 DIAGNOSIS — R7303 Prediabetes: Secondary | ICD-10-CM | POA: Insufficient documentation

## 2018-02-17 DIAGNOSIS — J439 Emphysema, unspecified: Secondary | ICD-10-CM | POA: Diagnosis not present

## 2018-02-17 DIAGNOSIS — M353 Polymyalgia rheumatica: Secondary | ICD-10-CM | POA: Diagnosis present

## 2018-02-17 DIAGNOSIS — I480 Paroxysmal atrial fibrillation: Secondary | ICD-10-CM | POA: Diagnosis present

## 2018-02-17 DIAGNOSIS — X58XXXA Exposure to other specified factors, initial encounter: Secondary | ICD-10-CM | POA: Diagnosis present

## 2018-02-17 DIAGNOSIS — I700 Atherosclerosis of aorta: Secondary | ICD-10-CM | POA: Diagnosis present

## 2018-02-17 DIAGNOSIS — J9811 Atelectasis: Secondary | ICD-10-CM | POA: Diagnosis not present

## 2018-02-17 DIAGNOSIS — Z7984 Long term (current) use of oral hypoglycemic drugs: Secondary | ICD-10-CM | POA: Diagnosis not present

## 2018-02-17 DIAGNOSIS — Z87891 Personal history of nicotine dependence: Secondary | ICD-10-CM | POA: Diagnosis not present

## 2018-02-17 HISTORY — DX: Dyspnea, unspecified: R06.00

## 2018-02-17 HISTORY — DX: Sleep apnea, unspecified: G47.30

## 2018-02-17 HISTORY — DX: Prediabetes: R73.03

## 2018-02-17 LAB — GLUCOSE, CAPILLARY: Glucose-Capillary: 160 mg/dL — ABNORMAL HIGH (ref 70–99)

## 2018-02-17 LAB — URINALYSIS, ROUTINE W REFLEX MICROSCOPIC
Bilirubin Urine: NEGATIVE
Glucose, UA: NEGATIVE mg/dL
Hgb urine dipstick: NEGATIVE
Ketones, ur: NEGATIVE mg/dL
Leukocytes, UA: NEGATIVE
Nitrite: NEGATIVE
Protein, ur: NEGATIVE mg/dL
Specific Gravity, Urine: 1.02 (ref 1.005–1.030)
pH: 5 (ref 5.0–8.0)

## 2018-02-17 LAB — BLOOD GAS, ARTERIAL
Acid-Base Excess: 3.2 mmol/L — ABNORMAL HIGH (ref 0.0–2.0)
Bicarbonate: 26.3 mmol/L (ref 20.0–28.0)
Drawn by: 421801
FIO2: 21
O2 Saturation: 97.4 %
Patient temperature: 98.6
pCO2 arterial: 34.4 mmHg (ref 32.0–48.0)
pH, Arterial: 7.496 — ABNORMAL HIGH (ref 7.350–7.450)
pO2, Arterial: 92.5 mmHg (ref 83.0–108.0)

## 2018-02-17 LAB — TYPE AND SCREEN
ABO/RH(D): A POS
Antibody Screen: NEGATIVE

## 2018-02-17 LAB — COMPREHENSIVE METABOLIC PANEL
ALT: 17 U/L (ref 0–44)
AST: 22 U/L (ref 15–41)
Albumin: 3.6 g/dL (ref 3.5–5.0)
Alkaline Phosphatase: 48 U/L (ref 38–126)
Anion gap: 9 (ref 5–15)
BUN: 11 mg/dL (ref 8–23)
CO2: 24 mmol/L (ref 22–32)
Calcium: 8.7 mg/dL — ABNORMAL LOW (ref 8.9–10.3)
Chloride: 103 mmol/L (ref 98–111)
Creatinine, Ser: 0.99 mg/dL (ref 0.61–1.24)
GFR calc Af Amer: >60 mL/min (ref >60)
GFR calc non Af Amer: >60 mL/min (ref >60)
Glucose, Bld: 136 mg/dL — ABNORMAL HIGH (ref 70–99)
Potassium: 3.6 mmol/L (ref 3.5–5.1)
Sodium: 136 mmol/L (ref 135–145)
Total Bilirubin: 0.9 mg/dL (ref 0.3–1.2)
Total Protein: 5.8 g/dL — ABNORMAL LOW (ref 6.5–8.1)

## 2018-02-17 LAB — HEMOGLOBIN A1C
Hgb A1c MFr Bld: 6.9 % — ABNORMAL HIGH (ref 4.8–5.6)
Mean Plasma Glucose: 151.33 mg/dL

## 2018-02-17 LAB — SURGICAL PCR SCREEN
MRSA, PCR: NEGATIVE
Staphylococcus aureus: NEGATIVE

## 2018-02-17 LAB — CBC
HCT: 47.9 % (ref 39.0–52.0)
Hemoglobin: 16.8 g/dL (ref 13.0–17.0)
MCH: 36.7 pg — ABNORMAL HIGH (ref 26.0–34.0)
MCHC: 35.1 g/dL (ref 30.0–36.0)
MCV: 104.6 fL — ABNORMAL HIGH (ref 78.0–100.0)
Platelets: 188 10*3/uL (ref 150–400)
RBC: 4.58 MIL/uL (ref 4.22–5.81)
RDW: 12.5 % (ref 11.5–15.5)
WBC: 8.2 10*3/uL (ref 4.0–10.5)

## 2018-02-17 LAB — PROTIME-INR
INR: 1.01
Prothrombin Time: 13.2 seconds (ref 11.4–15.2)

## 2018-02-17 LAB — APTT: aPTT: 26 seconds (ref 24–36)

## 2018-02-17 NOTE — Progress Notes (Signed)
PCP  [name]. Nevada Crane MD  Cardiologist  Dr. Claiborne Billings  Echo  03-22-2018  Stress  03-21-2018

## 2018-02-19 ENCOUNTER — Inpatient Hospital Stay (HOSPITAL_COMMUNITY)
Admission: RE | Admit: 2018-02-19 | Discharge: 2018-02-21 | DRG: 167 | Disposition: A | Discharge status: Home / Self Care | Payer: PPO | Attending: Thoracic Surgery (Cardiothoracic Vascular Surgery) | Admitting: Thoracic Surgery (Cardiothoracic Vascular Surgery)

## 2018-02-19 ENCOUNTER — Encounter (HOSPITAL_COMMUNITY): Payer: Self-pay

## 2018-02-19 ENCOUNTER — Other Ambulatory Visit: Payer: Self-pay

## 2018-02-19 ENCOUNTER — Inpatient Hospital Stay (HOSPITAL_COMMUNITY): Payer: PPO | Admitting: Certified Registered Nurse Anesthetist

## 2018-02-19 ENCOUNTER — Encounter (HOSPITAL_COMMUNITY)
Admission: RE | Disposition: A | Discharge status: Home / Self Care | Payer: Self-pay | Source: Home / Self Care | Attending: Thoracic Surgery (Cardiothoracic Vascular Surgery)

## 2018-02-19 ENCOUNTER — Inpatient Hospital Stay (HOSPITAL_COMMUNITY): Payer: PPO

## 2018-02-19 DIAGNOSIS — E785 Hyperlipidemia, unspecified: Secondary | ICD-10-CM | POA: Diagnosis present

## 2018-02-19 DIAGNOSIS — Z79899 Other long term (current) drug therapy: Secondary | ICD-10-CM

## 2018-02-19 DIAGNOSIS — X58XXXA Exposure to other specified factors, initial encounter: Secondary | ICD-10-CM | POA: Diagnosis present

## 2018-02-19 DIAGNOSIS — Z77090 Contact with and (suspected) exposure to asbestos: Secondary | ICD-10-CM | POA: Diagnosis present

## 2018-02-19 DIAGNOSIS — T884XXA Failed or difficult intubation, initial encounter: Secondary | ICD-10-CM

## 2018-02-19 DIAGNOSIS — Z888 Allergy status to other drugs, medicaments and biological substances status: Secondary | ICD-10-CM | POA: Diagnosis not present

## 2018-02-19 DIAGNOSIS — Z7901 Long term (current) use of anticoagulants: Secondary | ICD-10-CM

## 2018-02-19 DIAGNOSIS — S060X9A Concussion with loss of consciousness of unspecified duration, initial encounter: Secondary | ICD-10-CM | POA: Diagnosis present

## 2018-02-19 DIAGNOSIS — M353 Polymyalgia rheumatica: Secondary | ICD-10-CM | POA: Diagnosis present

## 2018-02-19 DIAGNOSIS — K219 Gastro-esophageal reflux disease without esophagitis: Secondary | ICD-10-CM | POA: Diagnosis present

## 2018-02-19 DIAGNOSIS — Z87891 Personal history of nicotine dependence: Secondary | ICD-10-CM | POA: Diagnosis not present

## 2018-02-19 DIAGNOSIS — J8410 Pulmonary fibrosis, unspecified: Secondary | ICD-10-CM | POA: Diagnosis not present

## 2018-02-19 DIAGNOSIS — Z885 Allergy status to narcotic agent status: Secondary | ICD-10-CM

## 2018-02-19 DIAGNOSIS — I480 Paroxysmal atrial fibrillation: Secondary | ICD-10-CM | POA: Diagnosis present

## 2018-02-19 DIAGNOSIS — Z7984 Long term (current) use of oral hypoglycemic drugs: Secondary | ICD-10-CM

## 2018-02-19 DIAGNOSIS — I700 Atherosclerosis of aorta: Secondary | ICD-10-CM | POA: Diagnosis present

## 2018-02-19 DIAGNOSIS — J939 Pneumothorax, unspecified: Secondary | ICD-10-CM

## 2018-02-19 DIAGNOSIS — J849 Interstitial pulmonary disease, unspecified: Secondary | ICD-10-CM | POA: Diagnosis present

## 2018-02-19 HISTORY — PX: LUNG BIOPSY: SHX5088

## 2018-02-19 HISTORY — DX: Failed or difficult intubation, initial encounter: T88.4XXA

## 2018-02-19 HISTORY — PX: VIDEO ASSISTED THORACOSCOPY: SHX5073

## 2018-02-19 LAB — GLUCOSE, CAPILLARY
Glucose-Capillary: 152 mg/dL — ABNORMAL HIGH (ref 70–99)
Glucose-Capillary: 201 mg/dL — ABNORMAL HIGH (ref 70–99)

## 2018-02-19 SURGERY — VIDEO ASSISTED THORACOSCOPY
Anesthesia: General | Site: Chest | Laterality: Right

## 2018-02-19 MED ORDER — LACTATED RINGERS IV SOLN
INTRAVENOUS | Status: DC | PRN
  Administered 2018-02-19: 08:00:00 via INTRAVENOUS

## 2018-02-19 MED ORDER — FENTANYL CITRATE (PF) 250 MCG/5ML IJ SOLN
INTRAMUSCULAR | Status: AC
  Filled 2018-02-19: qty 5

## 2018-02-19 MED ORDER — NALOXONE HCL 0.4 MG/ML IJ SOLN: 0.4000 mg | INTRAMUSCULAR | Status: DC | PRN

## 2018-02-19 MED ORDER — FENTANYL CITRATE (PF) 100 MCG/2ML IJ SOLN
INTRAMUSCULAR | Status: DC | PRN
  Administered 2018-02-19: 100 ug via INTRAVENOUS
  Administered 2018-02-19 (×7): 50 ug via INTRAVENOUS

## 2018-02-19 MED ORDER — DIPHENHYDRAMINE HCL 12.5 MG/5ML PO ELIX
12.5000 mg | ORAL_SOLUTION | Freq: Four times a day (QID) | ORAL | Status: DC | PRN
  Filled 2018-02-19: qty 5

## 2018-02-19 MED ORDER — KETOROLAC TROMETHAMINE 30 MG/ML IJ SOLN
INTRAMUSCULAR | Status: AC
  Filled 2018-02-19: qty 1

## 2018-02-19 MED ORDER — ACETAMINOPHEN 160 MG/5ML PO SOLN: 1000.0000 mg | Freq: Four times a day (QID) | ORAL | Status: DC

## 2018-02-19 MED ORDER — MEPERIDINE HCL 50 MG/ML IJ SOLN: 6.2500 mg | INTRAMUSCULAR | Status: DC | PRN

## 2018-02-19 MED ORDER — BISACODYL 5 MG PO TBEC
10.0000 mg | DELAYED_RELEASE_TABLET | Freq: Every day | ORAL | Status: DC
  Administered 2018-02-19 – 2018-02-21 (×3): 10 mg via ORAL
  Filled 2018-02-19 (×3): qty 2

## 2018-02-19 MED ORDER — MSM 1000 MG PO CAPS: 1000.0000 mg | ORAL_CAPSULE | Freq: Every day | ORAL | Status: DC

## 2018-02-19 MED ORDER — PROMETHAZINE HCL 25 MG/ML IJ SOLN: 6.2500 mg | INTRAMUSCULAR | Status: DC | PRN

## 2018-02-19 MED ORDER — POTASSIUM CHLORIDE 10 MEQ/50ML IV SOLN: 10.0000 meq | Freq: Every day | INTRAVENOUS | Status: DC | PRN

## 2018-02-19 MED ORDER — METOPROLOL TARTRATE 12.5 MG HALF TABLET
12.5000 mg | ORAL_TABLET | Freq: Two times a day (BID) | ORAL | Status: DC
  Administered 2018-02-19: 12.5 mg via ORAL
  Filled 2018-02-19: qty 1

## 2018-02-19 MED ORDER — PREDNISONE 10 MG PO TABS
5.0000 mg | ORAL_TABLET | Freq: Every day | ORAL | Status: DC
  Administered 2018-02-20 – 2018-02-21 (×2): 5 mg via ORAL
  Filled 2018-02-19 (×2): qty 1

## 2018-02-19 MED ORDER — CEFAZOLIN SODIUM-DEXTROSE 2-4 GM/100ML-% IV SOLN
2.0000 g | INTRAVENOUS | Status: AC
  Administered 2018-02-19: 2 g via INTRAVENOUS
  Filled 2018-02-19: qty 100

## 2018-02-19 MED ORDER — EPHEDRINE SULFATE-NACL 50-0.9 MG/10ML-% IV SOSY
PREFILLED_SYRINGE | INTRAVENOUS | Status: DC | PRN
  Administered 2018-02-19: 5 mg via INTRAVENOUS

## 2018-02-19 MED ORDER — HYDROMORPHONE 1 MG/ML IV SOLN
INTRAVENOUS | Status: DC
  Administered 2018-02-19: 25 mg via INTRAVENOUS
  Administered 2018-02-19: 4.8 mg via INTRAVENOUS
  Administered 2018-02-19: 2.2 mg via INTRAVENOUS
  Administered 2018-02-20: 1.2 mg via INTRAVENOUS
  Administered 2018-02-20: 1 mg via INTRAVENOUS
  Administered 2018-02-20: 0.2 mg via INTRAVENOUS
  Administered 2018-02-20: 0.6 mg via INTRAVENOUS
  Filled 2018-02-19: qty 25

## 2018-02-19 MED ORDER — PHENYLEPHRINE 40 MCG/ML (10ML) SYRINGE FOR IV PUSH (FOR BLOOD PRESSURE SUPPORT)
PREFILLED_SYRINGE | INTRAVENOUS | Status: AC
  Filled 2018-02-19: qty 10

## 2018-02-19 MED ORDER — TAMSULOSIN HCL 0.4 MG PO CAPS
0.4000 mg | ORAL_CAPSULE | Freq: Two times a day (BID) | ORAL | Status: DC
  Administered 2018-02-19 – 2018-02-21 (×5): 0.4 mg via ORAL
  Filled 2018-02-19 (×5): qty 1

## 2018-02-19 MED ORDER — SODIUM CHLORIDE 0.9 % IV SOLN
INTRAVENOUS | Status: DC | PRN
  Administered 2018-02-19: 25 ug/min via INTRAVENOUS

## 2018-02-19 MED ORDER — MIDAZOLAM HCL 2 MG/2ML IJ SOLN
INTRAMUSCULAR | Status: AC
  Filled 2018-02-19: qty 2

## 2018-02-19 MED ORDER — BUPIVACAINE HCL (PF) 0.5 % IJ SOLN
INTRAMUSCULAR | Status: AC
  Filled 2018-02-19: qty 30

## 2018-02-19 MED ORDER — ONDANSETRON HCL 4 MG/2ML IJ SOLN
INTRAMUSCULAR | Status: AC
  Filled 2018-02-19: qty 2

## 2018-02-19 MED ORDER — EPHEDRINE 5 MG/ML INJ
INTRAVENOUS | Status: AC
  Filled 2018-02-19: qty 10

## 2018-02-19 MED ORDER — ACETAMINOPHEN 500 MG PO TABS
1000.0000 mg | ORAL_TABLET | Freq: Four times a day (QID) | ORAL | Status: DC
  Administered 2018-02-19 – 2018-02-21 (×6): 1000 mg via ORAL
  Filled 2018-02-19 (×8): qty 2

## 2018-02-19 MED ORDER — ROCURONIUM BROMIDE 10 MG/ML (PF) SYRINGE
PREFILLED_SYRINGE | INTRAVENOUS | Status: DC | PRN
  Administered 2018-02-19: 20 mg via INTRAVENOUS
  Administered 2018-02-19: 50 mg via INTRAVENOUS
  Administered 2018-02-19: 10 mg via INTRAVENOUS

## 2018-02-19 MED ORDER — DEXAMETHASONE SODIUM PHOSPHATE 10 MG/ML IJ SOLN
INTRAMUSCULAR | Status: DC | PRN
  Administered 2018-02-19: 10 mg via INTRAVENOUS

## 2018-02-19 MED ORDER — CEFAZOLIN SODIUM-DEXTROSE 2-4 GM/100ML-% IV SOLN
2.0000 g | Freq: Three times a day (TID) | INTRAVENOUS | Status: AC
  Administered 2018-02-19 – 2018-02-20 (×2): 2 g via INTRAVENOUS
  Filled 2018-02-19 (×2): qty 100

## 2018-02-19 MED ORDER — PROPOFOL 10 MG/ML IV BOLUS
INTRAVENOUS | Status: DC | PRN
  Administered 2018-02-19: 100 mg via INTRAVENOUS

## 2018-02-19 MED ORDER — ALBUTEROL SULFATE (2.5 MG/3ML) 0.083% IN NEBU: 2.5000 mg | INHALATION_SOLUTION | RESPIRATORY_TRACT | Status: DC | PRN

## 2018-02-19 MED ORDER — FENTANYL CITRATE (PF) 100 MCG/2ML IJ SOLN
INTRAMUSCULAR | Status: AC
  Filled 2018-02-19: qty 2

## 2018-02-19 MED ORDER — INSULIN ASPART 100 UNIT/ML ~~LOC~~ SOLN
0.0000 [IU] | Freq: Four times a day (QID) | SUBCUTANEOUS | Status: DC
  Administered 2018-02-19: 4 [IU] via SUBCUTANEOUS
  Administered 2018-02-20 (×3): 2 [IU] via SUBCUTANEOUS
  Administered 2018-02-20: 4 [IU] via SUBCUTANEOUS
  Administered 2018-02-21: 8 [IU] via SUBCUTANEOUS

## 2018-02-19 MED ORDER — KETOROLAC TROMETHAMINE 30 MG/ML IJ SOLN
30.0000 mg | Freq: Three times a day (TID) | INTRAMUSCULAR | Status: AC
  Administered 2018-02-19 – 2018-02-20 (×4): 30 mg via INTRAVENOUS
  Filled 2018-02-19 (×4): qty 1

## 2018-02-19 MED ORDER — ONDANSETRON HCL 4 MG/2ML IJ SOLN
INTRAMUSCULAR | Status: DC | PRN
  Administered 2018-02-19: 4 mg via INTRAVENOUS

## 2018-02-19 MED ORDER — TRAMADOL HCL 50 MG PO TABS: 50.0000 mg | ORAL_TABLET | Freq: Four times a day (QID) | ORAL | Status: DC | PRN

## 2018-02-19 MED ORDER — PROPOFOL 10 MG/ML IV BOLUS
INTRAVENOUS | Status: AC
  Filled 2018-02-19: qty 20

## 2018-02-19 MED ORDER — DILTIAZEM HCL ER COATED BEADS 120 MG PO CP24
120.0000 mg | ORAL_CAPSULE | Freq: Every day | ORAL | Status: DC
  Administered 2018-02-19 – 2018-02-21 (×3): 120 mg via ORAL
  Filled 2018-02-19 (×3): qty 1

## 2018-02-19 MED ORDER — LIDOCAINE 2% (20 MG/ML) 5 ML SYRINGE
INTRAMUSCULAR | Status: AC
  Filled 2018-02-19: qty 5

## 2018-02-19 MED ORDER — 0.9 % SODIUM CHLORIDE (POUR BTL) OPTIME
TOPICAL | Status: DC | PRN
  Administered 2018-02-19: 2000 mL

## 2018-02-19 MED ORDER — LACTATED RINGERS IV SOLN
INTRAVENOUS | Status: DC
  Administered 2018-02-19: 12:00:00 via INTRAVENOUS

## 2018-02-19 MED ORDER — ROCURONIUM BROMIDE 50 MG/5ML IV SOSY
PREFILLED_SYRINGE | INTRAVENOUS | Status: AC
  Filled 2018-02-19: qty 15

## 2018-02-19 MED ORDER — ONDANSETRON HCL 4 MG/2ML IJ SOLN: 4.0000 mg | Freq: Four times a day (QID) | INTRAMUSCULAR | Status: DC | PRN

## 2018-02-19 MED ORDER — CEFAZOLIN SODIUM-DEXTROSE 2-4 GM/100ML-% IV SOLN
INTRAVENOUS | Status: AC
  Filled 2018-02-19: qty 100

## 2018-02-19 MED ORDER — FENTANYL CITRATE (PF) 100 MCG/2ML IJ SOLN
25.0000 ug | INTRAMUSCULAR | Status: DC | PRN
  Administered 2018-02-19 (×3): 50 ug via INTRAVENOUS

## 2018-02-19 MED ORDER — DIPHENHYDRAMINE HCL 50 MG/ML IJ SOLN: 12.5000 mg | Freq: Four times a day (QID) | INTRAMUSCULAR | Status: DC | PRN

## 2018-02-19 MED ORDER — MIDAZOLAM HCL 5 MG/5ML IJ SOLN
INTRAMUSCULAR | Status: DC | PRN
  Administered 2018-02-19: 1 mg via INTRAVENOUS
  Administered 2018-02-19: .5 mg via INTRAVENOUS
  Administered 2018-02-19: 0.5 mg via INTRAVENOUS

## 2018-02-19 MED ORDER — POTASSIUM CHLORIDE IN NACL 20-0.9 MEQ/L-% IV SOLN
INTRAVENOUS | Status: DC
  Administered 2018-02-19 – 2018-02-20 (×2): via INTRAVENOUS
  Filled 2018-02-19 (×2): qty 1000

## 2018-02-19 MED ORDER — EZETIMIBE 10 MG PO TABS
10.0000 mg | ORAL_TABLET | Freq: Every day | ORAL | Status: DC
  Administered 2018-02-19 – 2018-02-21 (×3): 10 mg via ORAL
  Filled 2018-02-19 (×3): qty 1

## 2018-02-19 MED ORDER — ONDANSETRON HCL 4 MG/2ML IJ SOLN
4.0000 mg | Freq: Four times a day (QID) | INTRAMUSCULAR | Status: DC | PRN
  Administered 2018-02-19: 4 mg via INTRAVENOUS
  Filled 2018-02-19: qty 2

## 2018-02-19 MED ORDER — BACITRACIN-NEOMYCIN-POLYMYXIN OINTMENT TUBE
1.0000 "application " | TOPICAL_OINTMENT | Freq: Every day | CUTANEOUS | Status: DC
  Filled 2018-02-19: qty 14

## 2018-02-19 MED ORDER — SODIUM CHLORIDE 0.9% FLUSH: 9.0000 mL | INTRAVENOUS | Status: DC | PRN

## 2018-02-19 MED ORDER — DEXAMETHASONE SODIUM PHOSPHATE 10 MG/ML IJ SOLN
INTRAMUSCULAR | Status: AC
  Filled 2018-02-19: qty 1

## 2018-02-19 MED ORDER — SUCCINYLCHOLINE CHLORIDE 200 MG/10ML IV SOSY
PREFILLED_SYRINGE | INTRAVENOUS | Status: AC
  Filled 2018-02-19: qty 10

## 2018-02-19 MED ORDER — SUGAMMADEX SODIUM 200 MG/2ML IV SOLN
INTRAVENOUS | Status: DC | PRN
  Administered 2018-02-19: 200 mg via INTRAVENOUS

## 2018-02-19 MED ORDER — SENNOSIDES-DOCUSATE SODIUM 8.6-50 MG PO TABS
1.0000 | ORAL_TABLET | Freq: Every day | ORAL | Status: DC
  Administered 2018-02-19 – 2018-02-20 (×2): 1 via ORAL
  Filled 2018-02-19 (×2): qty 1

## 2018-02-19 SURGICAL SUPPLY — 81 items
ADH SKN CLS APL DERMABOND .7 (GAUZE/BANDAGES/DRESSINGS) ×2
APL SWBSTK 6 STRL LF DISP (MISCELLANEOUS) ×2
APPLICATOR COTTON TIP 6 STRL (MISCELLANEOUS) IMPLANT
APPLICATOR COTTON TIP 6IN STRL (MISCELLANEOUS) ×3
APPLIER CLIP ROT 10 11.4 M/L (STAPLE)
APR CLP MED LRG 11.4X10 (STAPLE)
BAG SPEC RTRVL LRG 6X4 10 (ENDOMECHANICALS)
CANISTER SUCT 3000ML PPV (MISCELLANEOUS) ×3 IMPLANT
CATH THORACIC 28FR RT ANG (CATHETERS) ×1 IMPLANT
CATH THORACIC 36FR (CATHETERS) IMPLANT
CATH THORACIC 36FR RT ANG (CATHETERS) IMPLANT
CLIP APPLIE ROT 10 11.4 M/L (STAPLE) IMPLANT
CLIP VESOCCLUDE MED 6/CT (CLIP) IMPLANT
CONN Y 3/8X3/8X3/8  BEN (MISCELLANEOUS)
CONN Y 3/8X3/8X3/8 BEN (MISCELLANEOUS) ×1 IMPLANT
CONT SPEC 4OZ CLIKSEAL STRL BL (MISCELLANEOUS) ×10 IMPLANT
COVER SURGICAL LIGHT HANDLE (MISCELLANEOUS) ×1 IMPLANT
COVER WAND RF STERILE (DRAPES) ×3 IMPLANT
DERMABOND ADVANCED (GAUZE/BANDAGES/DRESSINGS) ×1
DERMABOND ADVANCED .7 DNX12 (GAUZE/BANDAGES/DRESSINGS) ×1 IMPLANT
DRAIN CHANNEL 28F RND 3/8 FF (WOUND CARE) IMPLANT
DRAIN CHANNEL 32F RND 10.7 FF (WOUND CARE) IMPLANT
DRAPE LAPAROSCOPIC ABDOMINAL (DRAPES) ×3 IMPLANT
DRAPE SLUSH/WARMER DISC (DRAPES) ×3 IMPLANT
ELECT REM PT RETURN 9FT ADLT (ELECTROSURGICAL) ×3
ELECTRODE REM PT RTRN 9FT ADLT (ELECTROSURGICAL) ×2 IMPLANT
GAUZE 4X4 16PLY RFD (DISPOSABLE) ×2 IMPLANT
GAUZE SPONGE 4X4 12PLY STRL (GAUZE/BANDAGES/DRESSINGS) ×3 IMPLANT
GAUZE SPONGE 4X4 12PLY STRL LF (GAUZE/BANDAGES/DRESSINGS) ×1 IMPLANT
GLOVE SURG SIGNA 7.5 PF LTX (GLOVE) ×6 IMPLANT
GOWN STRL REUS W/ TWL LRG LVL3 (GOWN DISPOSABLE) ×4 IMPLANT
GOWN STRL REUS W/ TWL XL LVL3 (GOWN DISPOSABLE) ×2 IMPLANT
GOWN STRL REUS W/TWL LRG LVL3 (GOWN DISPOSABLE) ×12
GOWN STRL REUS W/TWL XL LVL3 (GOWN DISPOSABLE) ×3
HEMOSTAT SURGICEL 2X14 (HEMOSTASIS) IMPLANT
IV CATH 22GX1 FEP (IV SOLUTION) IMPLANT
KIT BASIN OR (CUSTOM PROCEDURE TRAY) ×3 IMPLANT
KIT SUCTION CATH 14FR (SUCTIONS) ×2 IMPLANT
KIT TURNOVER KIT B (KITS) ×3 IMPLANT
NS IRRIG 1000ML POUR BTL (IV SOLUTION) ×6 IMPLANT
PACK CHEST (CUSTOM PROCEDURE TRAY) ×3 IMPLANT
PAD ARMBOARD 7.5X6 YLW CONV (MISCELLANEOUS) ×6 IMPLANT
POUCH ENDO CATCH II 15MM (MISCELLANEOUS) IMPLANT
POUCH SPECIMEN RETRIEVAL 10MM (ENDOMECHANICALS) IMPLANT
RELOAD STAPLE 60 3.8 GOLD REG (STAPLE) IMPLANT
RELOAD STAPLER GOLD 60MM (STAPLE) ×2 IMPLANT
SEALANT PROGEL (MISCELLANEOUS) IMPLANT
SEALANT SURG COSEAL 4ML (VASCULAR PRODUCTS) IMPLANT
SEALANT SURG COSEAL 8ML (VASCULAR PRODUCTS) IMPLANT
SOLUTION ANTI FOG 6CC (MISCELLANEOUS) ×3 IMPLANT
SPECIMEN JAR MEDIUM (MISCELLANEOUS) ×2 IMPLANT
SPONGE INTESTINAL PEANUT (DISPOSABLE) ×1 IMPLANT
SPONGE LAP 18X18 X RAY DECT (DISPOSABLE) ×2 IMPLANT
SPONGE TONSIL TAPE 1 RFD (DISPOSABLE) ×3 IMPLANT
STAPLE ECHEON FLEX 60 POW ENDO (STAPLE) ×10 IMPLANT
STAPLER RELOAD GOLD 60MM (STAPLE) ×3
SUT PROLENE 4 0 RB 1 (SUTURE)
SUT PROLENE 4-0 RB1 .5 CRCL 36 (SUTURE) IMPLANT
SUT SILK  1 MH (SUTURE) ×1
SUT SILK 1 MH (SUTURE) ×3 IMPLANT
SUT SILK 2 0SH CR/8 30 (SUTURE) IMPLANT
SUT SILK 3 0SH CR/8 30 (SUTURE) IMPLANT
SUT VIC AB 1 CTX 36 (SUTURE) ×3
SUT VIC AB 1 CTX36XBRD ANBCTR (SUTURE) IMPLANT
SUT VIC AB 2-0 CTX 36 (SUTURE) ×2 IMPLANT
SUT VIC AB 2-0 UR6 27 (SUTURE) IMPLANT
SUT VIC AB 3-0 MH 27 (SUTURE) IMPLANT
SUT VIC AB 3-0 X1 27 (SUTURE) ×3 IMPLANT
SUT VICRYL 2 TP 1 (SUTURE) IMPLANT
SYR 20CC LL (SYRINGE) IMPLANT
SYSTEM SAHARA CHEST DRAIN ATS (WOUND CARE) ×3 IMPLANT
TAPE CLOTH 4X10 WHT NS (GAUZE/BANDAGES/DRESSINGS) ×3 IMPLANT
TAPE CLOTH SURG 4X10 WHT LF (GAUZE/BANDAGES/DRESSINGS) ×1 IMPLANT
TIP APPLICATOR SPRAY EXTEND 16 (VASCULAR PRODUCTS) IMPLANT
TOWEL GREEN STERILE (TOWEL DISPOSABLE) ×3 IMPLANT
TOWEL GREEN STERILE FF (TOWEL DISPOSABLE) ×3 IMPLANT
TRAY FOLEY MTR SLVR 16FR STAT (SET/KITS/TRAYS/PACK) ×3 IMPLANT
TROCAR XCEL BLADELESS 5X75MML (TROCAR) ×3 IMPLANT
TROCAR XCEL NON-BLD 5MMX100MML (ENDOMECHANICALS) IMPLANT
TUBE CONNECTING 12X1/4 (SUCTIONS) ×1 IMPLANT
WATER STERILE IRR 1000ML POUR (IV SOLUTION) ×6 IMPLANT

## 2018-02-19 NOTE — Anesthesia Postprocedure Evaluation (Signed)
Anesthesia Post Note  Patient: [name]  Procedure(s) Performed: VIDEO ASSISTED THORACOSCOPY (Right Chest) LUNG BIOPSY (Right )     Patient location during evaluation: PACU Anesthesia Type: General Level of consciousness: awake and alert Vital Signs Assessment: post-procedure vital signs reviewed and stable Respiratory status: spontaneous breathing, nonlabored ventilation, respiratory function stable and patient connected to nasal cannula oxygen Cardiovascular status: blood pressure returned to baseline and stable Postop Assessment: no apparent nausea or vomiting Anesthetic complications: no Comments: Continually treating pain within ideal O2 saturation parameters    Last Vitals:  Vitals:   02/19/18 1226 02/19/18 1300  BP: 124/71 107/75  Pulse: 79   Resp: (!) 22   Temp: (!) 36.3 C   SpO2: 91%                Effie Berkshire

## 2018-02-19 NOTE — Anesthesia Procedure Notes (Signed)
Arterial Line Insertion Start/End10/01/2018 8:13 AM, 02/19/2018 8:30 AM Performed by: Carney Living, CRNA, CRNA  Patient location: Pre-op. Preanesthetic checklist: patient identified, IV checked, risks and benefits discussed, monitors and equipment checked and pre-op evaluation Left, radial was placed Catheter size: 20 G Hand hygiene performed  and maximum sterile barriers used   Attempts: 2 Procedure performed without using ultrasound guided technique. Following insertion, dressing applied and Biopatch. Post procedure assessment: normal and unchanged  Patient tolerated the procedure with difficulty.

## 2018-02-19 NOTE — Anesthesia Procedure Notes (Signed)
Central Venous Catheter Insertion Performed by: Belinda Block, MD, anesthesiologist Start/End10/01/2018 7:55 AM, 02/19/2018 8:10 AM Preanesthetic checklist: patient identified, IV checked, site marked, risks and benefits discussed, surgical consent, monitors and equipment checked, pre-op evaluation and timeout performed Position: Trendelenburg Lidocaine 1% used for infiltration and patient sedated Hand hygiene performed , maximum sterile barriers used  and Seldinger technique used Double lumen Procedure performed using ultrasound guided technique. Ultrasound Notes:anatomy identified Attempts: 1 Following insertion, line sutured. Post procedure assessment: blood return through all ports  Patient tolerated the procedure well with no immediate complications.

## 2018-02-19 NOTE — Anesthesia Procedure Notes (Signed)
Procedure Name: Intubation Date/Time: 02/19/2018 8:59 AM Performed by: Carney Living, CRNA Pre-anesthesia Checklist: Patient identified, Emergency Drugs available, Suction available, Patient being monitored and Timeout performed Patient Re-evaluated:Patient Re-evaluated prior to induction Oxygen Delivery Method: Circle system utilized Preoxygenation: Pre-oxygenation with 100% oxygen Induction Type: IV induction Ventilation: Mask ventilation without difficulty and Oral airway inserted - appropriate to patient size Laryngoscope Size: Glidescope Grade View: Grade I Tube type: Oral Endobronchial tube: Left, Double lumen EBT, EBT position confirmed by auscultation and EBT position confirmed by fiberoptic bronchoscope and 39 Fr Number of attempts: 3 Airway Equipment and Method: Stylet,  Video-laryngoscopy and Fiberoptic brochoscope Placement Confirmation: ETT inserted through vocal cords under direct vision,  positive ETCO2 and breath sounds checked- equal and bilateral Tube secured with: Tape Dental Injury: Teeth and Oropharynx as per pre-operative assessment  Comments: DL X 2 with Mac 4, able to see arytnoids but unable to pass DL ETT, DL x1 with glide scope, 8.0 ETT placed.  36F DL ETT passed over [facility] catheter, visualized with glide scope and positioned with Fiber Optic Bronchoscope

## 2018-02-19 NOTE — Op Note (Signed)
NAME: SUSAN, ARANA MEDICAL RECORD [id number] ACCOUNT [id number] DATE OF BIRTH:1945-11-06 FACILITY: [facility] LOCATION: [facility] PHYSICIAN:Hydee Fleece Chaya Jan, MD  OPERATIVE REPORT  DATE OF PROCEDURE:  02/19/2018  PREOPERATIVE DIAGNOSIS:  Interstitial lung disease.  POSTOPERATIVE DIAGNOSIS:  Interstitial lung disease.  PROCEDURE:  Right video-assisted thoracoscopy, lung biopsy x4.  SURGEON:  Modesto Charon, MD  ASSISTANT:  Lars Pinks, PA-C  ANESTHESIA:  General.  FINDINGS:  Initial biopsy showed normal lung.  Second frozen section showed lesional tissue.  CLINICAL NOTE:  The patient is a 72 year old gentleman who has had progressive dyspnea.  Workup has revealed interstitial lung disease.  He was referred for surgical lung biopsy to confirm a specific diagnosis and guide treatment.  The indications,  risks, benefits, and alternatives were discussed in detail with the patient.  He understood and accepted the risks and agreed to proceed.  OPERATIVE NOTE:  The patient was brought to the preoperative holding area on 02/19/2018.  Anesthesia placed an arterial blood pressure monitoring line and established intravenous access.  He was taken to the operating room, anesthetized and intubated  with a double lumen endotracheal tube.  A Foley catheter was placed.  Sequential compression devices were placed on the calves for DVT prophylaxis.  Intravenous antibiotics were administered.  The right chest was prepped and draped in the usual sterile  fashion.  Single lung ventilation of the left lung was initiated and was tolerated well throughout the procedure.  A timeout was performed.  An incision then was made in the seventh interspace in the mid axillary line.  A 5 mm port was inserted into the chest.  There was good isolation of the right lung.  A 5 cm working incision was made in the 5th interspace  anterolaterally.  No rib spreading was performed during the procedure.  A biopsy  then was taken from the inferior aspect of the right lower lobe posteriorly.  This was done with sequential firings of an Echelon 60 mm stapler with gold cartridges.  The  specimen was sent for frozen section.  While awaiting those results, a biopsy was taken from the lateral aspect of the upper lobe and also from the inferior margin of the middle lobe.  The frozen section on the lower lobe nodule returned with no tumor  seen.  The upper lobe lesion was sent for frozen section to ensure there was a lesion appropriate for diagnostic purposes.  A second biopsy was taken from the lower lobe.  This was more anteriorly.  This was sent for permanent pathology only.  The frozen  on the right upper lobe did show a lesional tissue.  A final inspection was made.  There was good hemostasis at the staple lines.  A 28 French chest tube was placed through the original port incision and secured with a #1 silk suture.  The right lung  was reinflated.  The working incision was closed in 3 layers in a standard fashion.  The chest tube was placed to suction.  The patient was placed back in the supine position.  He was extubated in the operating room and taken to the [place]  [facility] in good condition.  AN/NUANCE  D:02/19/2018 T:02/19/2018 JOB:[id number]

## 2018-02-19 NOTE — Transfer of Care (Signed)
Immediate Anesthesia Transfer of Care Note  Patient: [name]  Procedure(s) Performed: VIDEO ASSISTED THORACOSCOPY (Right Chest) LUNG BIOPSY (Right )  Patient Location: PACU  Anesthesia Type:General  Level of Consciousness: awake, alert , oriented and pateint uncooperative  Airway & Oxygen Therapy: Patient Spontanous Breathing and Patient connected to nasal cannula oxygen  Post-op Assessment: Report given to RN, Post -op Vital signs reviewed and stable and Patient moving all extremities X 4  Post vital signs: Reviewed and stable  Last Vitals:  Vitals Value Taken Time  BP 110/74 02/19/2018 11:11 AM  Temp    Pulse 88 02/19/2018 11:22 AM  Resp 22 02/19/2018 11:22 AM  SpO2 91 % 02/19/2018 11:22 AM  Vitals shown include unvalidated device data.  Last Pain:  Vitals:   02/19/18 0706  TempSrc: Oral  PainSc:       Patients Stated Pain Goal: 2 (99/35/70 1779)  Complications: No apparent anesthesia complications

## 2018-02-19 NOTE — Anesthesia Preprocedure Evaluation (Addendum)
Anesthesia Evaluation  Patient identified by MRN, date of birth, ID band Patient awake    Reviewed: Allergy & Precautions, NPO status , Patient's Chart, lab work & pertinent test results, reviewed documented beta blocker date and time   Airway Mallampati: III  TM Distance: >3 FB Neck ROM: Full    Dental  (+) Teeth Intact, Dental Advisory Given   Pulmonary shortness of breath, sleep apnea ,    breath sounds clear to auscultation       Cardiovascular hypertension, Pt. on home beta blockers + dysrhythmias Atrial Fibrillation + Valvular Problems/Murmurs MVP  Rhythm:Regular Rate:Normal     Neuro/Psych negative neurological ROS     GI/Hepatic GERD  Medicated and Controlled,  Endo/Other  diabetes, Type 2, Oral Hypoglycemic Agents  Renal/GU      Musculoskeletal  (+) Arthritis ,   Abdominal Normal abdominal exam  (+) - obese,   Peds  Hematology   Anesthesia Other Findings - HLD  Reproductive/Obstetrics                           Anesthesia Physical Anesthesia Plan  ASA: III  Anesthesia Plan: General   Post-op Pain Management:    Induction: Intravenous  PONV Risk Score and Plan: 3 and Ondansetron, Dexamethasone and Treatment may vary due to age or medical condition  Airway Management Planned: Double Lumen EBT  Additional Equipment: Arterial line and CVP  Intra-op Plan:   Post-operative Plan: Extubation in OR  Informed Consent: I have reviewed the patients History and Physical, chart, labs and discussed the procedure including the risks, benefits and alternatives for the proposed anesthesia with the patient or authorized representative who has indicated his/her understanding and acceptance.   Dental advisory given  Plan Discussed with: CRNA, Anesthesiologist and Surgeon  Anesthesia Plan Comments:       Anesthesia Quick Evaluation

## 2018-02-19 NOTE — Brief Op Note (Addendum)
02/19/2018  10:50 AM  PATIENT:  [name]  72 y.o. male  PRE-OPERATIVE DIAGNOSIS:  ILD  POST-OPERATIVE DIAGNOSIS:  ILD   PROCEDURE:  RIGHT VIDEO ASSISTED THORACOSCOPY (Right), LUNG BIOPSIES of RUL, RML, and RLL  SURGEON:  Surgeon(s) and Role:    [name], MD - Primary  PHYSICIAN ASSISTANT: Lars Pinks PA-C  ANESTHESIA:   general  EBL:  50 mL   BLOOD ADMINISTERED:none  DRAINS: 28 French chest tube placed in the right pleural space   SPECIMEN:  RUL,RML, and RLL lung biopsies  DISPOSITION OF SPECIMEN:  PATHOLOGY  COUNTS CORRECT:  YES  DICTATION: .Dragon Dictation  PLAN OF CARE: Admit to inpatient   PATIENT DISPOSITION:  PACU - hemodynamically stable.   Delay start of Pharmacological VTE agent (>24hrs) due to surgical blood loss or risk of bleeding: yes

## 2018-02-19 NOTE — Interval H&P Note (Signed)
History and Physical Interval Note:  02/19/2018 8:18 AM  [name]  has presented today for surgery, with the diagnosis of ILD  The various methods of treatment have been discussed with the patient and family. After consideration of risks, benefits and other options for treatment, the patient has consented to  Procedure(s): VIDEO ASSISTED THORACOSCOPY (Right) LUNG BIOPSY (Right) as a surgical intervention .  The patient's history has been reviewed, patient examined, no change in status, stable for surgery.  I have reviewed the patient's chart and labs.  Questions were answered to the patient's satisfaction.     [name]

## 2018-02-19 NOTE — Progress Notes (Signed)
PHARMACIST - PHYSICIAN ORDER COMMUNICATION  CONCERNING: P&T Medication Policy on Herbal Medications  DESCRIPTION:  This patient's order for:  MSM 1000 mg  has been noted.  This product(s) is classified as an "herbal" or natural product. Due to a lack of definitive safety studies or FDA approval, nonstandard manufacturing practices, plus the potential risk of unknown drug-drug interactions while on inpatient medications, the Pharmacy and Therapeutics Committee does not permit the use of "herbal" or natural products of this type within [facility].   ACTION TAKEN: The pharmacy department is unable to verify this order at this time and your patient has been informed of this safety policy. Please reevaluate patient's clinical condition at discharge and address if the herbal or natural product(s) should be resumed at that time.

## 2018-02-20 ENCOUNTER — Inpatient Hospital Stay (HOSPITAL_COMMUNITY): Payer: PPO

## 2018-02-20 ENCOUNTER — Encounter (HOSPITAL_COMMUNITY): Payer: Self-pay | Admitting: Thoracic Surgery (Cardiothoracic Vascular Surgery)

## 2018-02-20 LAB — GLUCOSE, CAPILLARY
Glucose-Capillary: 144 mg/dL — ABNORMAL HIGH (ref 70–99)
Glucose-Capillary: 145 mg/dL — ABNORMAL HIGH (ref 70–99)
Glucose-Capillary: 150 mg/dL — ABNORMAL HIGH (ref 70–99)
Glucose-Capillary: 174 mg/dL — ABNORMAL HIGH (ref 70–99)

## 2018-02-20 LAB — BASIC METABOLIC PANEL
Anion gap: 7 (ref 5–15)
BUN: 8 mg/dL (ref 8–23)
CO2: 25 mmol/L (ref 22–32)
Calcium: 8.2 mg/dL — ABNORMAL LOW (ref 8.9–10.3)
Chloride: 104 mmol/L (ref 98–111)
Creatinine, Ser: 0.66 mg/dL (ref 0.61–1.24)
GFR calc Af Amer: >60 mL/min (ref >60)
GFR calc non Af Amer: >60 mL/min (ref >60)
Glucose, Bld: 153 mg/dL — ABNORMAL HIGH (ref 70–99)
Potassium: 4.4 mmol/L (ref 3.5–5.1)
Sodium: 136 mmol/L (ref 135–145)

## 2018-02-20 LAB — CBC
HCT: 43.1 % (ref 39.0–52.0)
Hemoglobin: 15 g/dL (ref 13.0–17.0)
MCH: 36.3 pg — ABNORMAL HIGH (ref 26.0–34.0)
MCHC: 34.8 g/dL (ref 30.0–36.0)
MCV: 104.4 fL — ABNORMAL HIGH (ref 80.0–100.0)
Platelets: 184 10*3/uL (ref 150–400)
RBC: 4.13 MIL/uL — ABNORMAL LOW (ref 4.22–5.81)
RDW: 12.4 % (ref 11.5–15.5)
WBC: 15 10*3/uL — ABNORMAL HIGH (ref 4.0–10.5)
nRBC: 0 % (ref 0.0–0.2)

## 2018-02-20 LAB — BLOOD GAS, ARTERIAL
Acid-Base Excess: 3.6 mmol/L — ABNORMAL HIGH (ref 0.0–2.0)
Bicarbonate: 27.4 mmol/L (ref 20.0–28.0)
O2 Content: 6 L/min
O2 Saturation: 95.4 %
Patient temperature: 98.6
pCO2 arterial: 39.8 mmHg (ref 32.0–48.0)
pH, Arterial: 7.452 — ABNORMAL HIGH (ref 7.350–7.450)
pO2, Arterial: 73.8 mmHg — ABNORMAL LOW (ref 83.0–108.0)

## 2018-02-20 MED ORDER — KETOROLAC TROMETHAMINE 15 MG/ML IJ SOLN
15.0000 mg | Freq: Once | INTRAMUSCULAR | Status: AC
  Administered 2018-02-20: 15 mg via INTRAVENOUS
  Filled 2018-02-20: qty 1

## 2018-02-20 MED ORDER — GUAIFENESIN ER 600 MG PO TB12
600.0000 mg | ORAL_TABLET | Freq: Two times a day (BID) | ORAL | Status: DC
  Administered 2018-02-20 – 2018-02-21 (×3): 600 mg via ORAL
  Filled 2018-02-20 (×3): qty 1

## 2018-02-20 MED ORDER — METOPROLOL TARTRATE 25 MG PO TABS
25.0000 mg | ORAL_TABLET | Freq: Two times a day (BID) | ORAL | Status: DC
  Administered 2018-02-20 – 2018-02-21 (×3): 25 mg via ORAL
  Filled 2018-02-20 (×3): qty 1

## 2018-02-20 NOTE — Progress Notes (Signed)
RN wasted 58mL Dilaudid PCA in sink with Judeth Cornfield, RN.

## 2018-02-20 NOTE — Progress Notes (Signed)
Most recent chest x ray reviewed and is stable. There is NO air leak from the chest tube. Will remove chest tube and stop PCA. Will give Toradol 15 mg IV prior to removing chest tube.

## 2018-02-20 NOTE — Discharge Instructions (Signed)
Thoracoscopy, Care After °Refer to this sheet in the next few weeks. These instructions provide you with information about caring for yourself after your procedure. Your health care provider may also give you more specific instructions. Your treatment has been planned according to current medical practices, but problems sometimes occur. Call your health care provider if you have any problems or questions after your procedure. °What can I expect after the procedure? °After your procedure, it is common to feel sore for up to two weeks. °Follow these instructions at home: °· There are many different ways to close and cover an incision, including stitches (sutures), skin glue, and adhesive strips. Follow your health care provider's instructions about: °? Incision care. °? Bandage (dressing) changes and removal. °? Incision closure removal. °· Check your incision area every day for signs of infection. Watch for: °? Redness, swelling, or pain. °? Fluid, blood, or pus. °· Take medicines only as directed by your health care provider. °· Try to cough often. Coughing helps to protect against lung infection (pneumonia). It may hurt to cough. If this happens, hold a pillow against your chest when you cough. °· Take deep breaths. This also helps to protect against pneumonia. °· If you were given an incentive spirometer, use it as directed by your health care provider. °· Do not take baths, swim, or use a hot tub until your health care provider approves. You may take showers. °· Avoid lifting until your health care provider approves. °· Avoid driving until your health care provider approves. °· Do not travel by airplane after the chest tube is removed until your health care provider approves. °Contact a health care provider if: °· You have a fever. °· Pain medicines do not ease your pain. °· You have redness, swelling, or increasing pain in your incision area. °· You develop a cough that does not go away, or you are coughing up  mucus that is yellow or green. °Get help right away if: °· You have fluid, blood, or pus coming from your incision. °· There is a bad smell coming from your incision or dressing. °· You develop a rash. °· You have difficulty breathing. °· You cough up blood. °· You develop light-headedness or you feel faint. °· You develop chest pain. °· Your heartbeat feels irregular or very fast. °This information is not intended to replace advice given to you by your health care provider. Make sure you discuss any questions you have with your health care provider. °Document Released: 11/17/2004 Document Revised: 01/01/2016 Document Reviewed: 01/13/2014 °Elsevier Interactive Patient Education © 2018 [employer] ° °

## 2018-02-20 NOTE — Discharge Summary (Addendum)
Physician Discharge Summary       [address]       [place] [address]             [phone]    [name] MRN: [id number] DOB/AGE: [birth year] 70 y.o.  Admit date: 02/19/2018 Discharge date: 02/21/2018  Admission Diagnoses:  ILD (interstitial lung disease) ([place])  Discharge Diagnoses:  1. S/p right VATS, lung biopsies 2. History of arthritis 3. History of GERD (gastroesophageal reflux disease) 4. History of head injury, closed, with concussion 5. History of Polymyalgia rheumatica ([place]) 6. History of Hyperliidemia 7.History of PAF (paroxysmal atrial fibrillation) ([facility])p 8. History of viral myocarditis 9. History of tobacco abuse  Procedure (s):  Right video-assisted thoracoscopy, lung biopsy x4 by Dr. Roxan Hockey 02/19/2018.  Pathology: Final pathology pending  History of Presenting Illness: [name] is a 72 year old man with a past medical history significant for paroxysmal atrial fibrillation, viral myocarditis, polymyalgia rheumatica, gastroesophageal reflux and hyperlipidemia.  He had a history of minimal tobacco use many years ago.  He also had some brief asbestos exposure.  He has been having issues with shortness of breath over the past couple years but his symptoms have progressed over the past 4 months.  His shortness of breath is particularly severe when he goes up an incline or walks up a flight of stairs.  He can only walk about half a flight before having to stop and catch his breath.  He has been treated with multiple rounds of antibiotics and prednisone.  His symptoms improve each time but since he is off the prednisone his symptoms recur. Dr. Roxan Hockey described the proposed procedure to Mr. and Mrs. Wilmore.  They understand that this is a major operative procedure it would be done in the operating room under general anesthesia.  They discussed the incisions to be used in the need for drainage tube postoperatively.   He will likely be in the hospital 3 to 4 days postoperatively.  They understand this is diagnostic and not therapeutic.  Dr. Roxan Hockey informed them of the indications, risks, benefits, and alternatives. Patient agreed to proceed with surgery. Patient underwent a right VATS, lung biopsies on 10/08.   Brief Hospital Course:  The patient remained afebrile and hemodynamically stable. A line and foley were removed early in the post operative course. Chest tube output gradually decreased. Daily chest x rays were obtained and remained stable. There was no air leak so chest tube was placed to water seal on 02/20/2018. Another chest x ray was taken and was stable (no pneumothorax). Chest tube was then removed on  02/20/2018. Patient is ambulating on room air. Patient is tolerating a diet and has had a bowel movement. Wounds are clean and dry. Final chest X ray showed right base atelectasis but no no pneumothorax. Central line was removed 10/11. Per Dr. Roxan Hockey, patient is felt surgically stable for discharge today.   Latest Vital Signs: Blood pressure 137/85, pulse 85, temperature 98.7 F (37.1 C), temperature source Oral, resp. rate 20, height 5\' 9"  (1.753 m), weight 84.7 kg, SpO2 93 %.  Physical Exam: General appearance: alert, cooperative and no distress Neurologic: intact Heart: regular rate and rhythm Lungs: clear to auscultation bilaterally Abdomen: normal findings: soft, non-tender  Discharge Condition: Stable and discharge to home.  Recent laboratory studies:  Lab Results  Component Value Date   WBC 13.2 (H) 02/21/2018   HGB 14.2 02/21/2018   HCT 41.8 02/21/2018   MCV 105.3 (H)  02/21/2018   PLT 151 02/21/2018   Lab Results  Component Value Date   NA 140 02/21/2018   K 3.6 02/21/2018   CL 108 02/21/2018   CO2 28 02/21/2018   CREATININE 0.80 02/21/2018   GLUCOSE 118 (H) 02/21/2018      Diagnostic Studies: Dg Chest 2 View  Result Date: 02/17/2018 CLINICAL DATA:   Pre-admission for lung biopsy. EXAM: CHEST - 2 VIEW COMPARISON:  11/26/2017.  Chest CT, 12/06/2017. FINDINGS: The heart size and mediastinal contours are within normal limits. Both lungs are clear. No pleural effusion or pneumothorax. The visualized skeletal structures are unremarkable. IMPRESSION: No active cardiopulmonary disease. Electronically Signed   By: Lajean Manes M.D.   On: 02/17/2018 17:24   Dg Op Swallowing Func-medicare/speech Path  Result Date: 01/29/2018 CLINICAL DATA:  Dysphagia EXAM: MODIFIED BARIUM SWALLOW TECHNIQUE: Different consistencies of barium were administered orally to the patient by the Speech Pathologist. Imaging of the pharynx was performed in the lateral projection. FLUOROSCOPY TIME:  Fluoroscopy Time:  1 minutes 12 seconds Radiation Exposure Index (if provided by the fluoroscopic device): Number of Acquired Spot Images: 0 COMPARISON:  None. FINDINGS: Thin liquid- within normal limits. Mildly prominent cricopharyngeus. Nectar thick liquid-not assessed Honey-not assessed Pure-within normal limits Cracker-not assessed Pure with cracker- within normal limits Barium tablet -  within normal limits IMPRESSION: Mildly prominent cricopharyngeus.  Otherwise unremarkable study. Please refer to the Speech Pathologists report for complete details and recommendations. Electronically Signed   By: Rolm Baptise M.D.   On: 01/29/2018 14:40   Dg Chest 1v Repeat Same Day  Result Date: 02/20/2018 CLINICAL DATA:  Interstitial lung disease, chest tube EXAM: CHEST - 1 VIEW SAME DAY COMPARISON:  02/20/2018 FINDINGS: Right central line and right chest tube remain in place, unchanged. No visible pneumothorax. Postoperative changes at the right lung base. Subcutaneous emphysema in the right chest wall is stable. Right base atelectasis. Mild cardiomegaly. No focal opacity or effusion on the left. IMPRESSION: Right chest tube remains in place with postoperative changes on the right. No visible  pneumothorax. Right base atelectasis. Mild cardiomegaly. Electronically Signed   By: Rolm Baptise M.D.   On: 02/20/2018 14:11   Dg Chest Port 1 View  Result Date: 02/20/2018 CLINICAL DATA:  Post chest tube removal right lung. Assess for pneumothorax. EXAM: PORTABLE CHEST 1 VIEW COMPARISON:  02/20/2018 at 1339 hours FINDINGS: Removal of right-sided chest tube with residual subcutaneous emphysema along the right lateral chest wall at site of chest tube insertion. No apparent pneumothorax. Lung volumes are low. Right IJ central line catheter is noted with tip terminating in the distal SVC. Stable cardiomegaly with aortic atherosclerosis. Bibasilar atelectasis is seen IMPRESSION: 1. No pneumothorax after right-sided chest tube removal. Low lung volumes with bibasilar atelectasis noted. 2. Cardiomegaly with aortic atherosclerosis. 3. Expected soft tissue emphysema at site of prior chest tube insertion. Electronically Signed   By: Ashley Royalty M.D.   On: 02/20/2018 16:11   Dg Chest Port 1 View  Result Date: 02/20/2018 CLINICAL DATA:  Follow-up chest tube EXAM: PORTABLE CHEST 1 VIEW COMPARISON:  02/19/2018 FINDINGS: Right-sided thoracostomy catheter is again identified. No sizable pneumothorax is noted. Right jugular central line is seen and stable. Postsurgical changes in the right base are noted. The left lung remains clear. Cardiac shadow is stable. IMPRESSION: No evidence of pneumothorax. Tubes and lines as described. Electronically Signed   By: Inez Catalina M.D.   On: 02/20/2018 11:04   Dg Chest [facility]  Result Date: 02/19/2018 CLINICAL DATA:  Status post right VATS EXAM: PORTABLE CHEST 1 VIEW COMPARISON:  02/17/2018 FINDINGS: Right jugular central line is noted in satisfactory position. Cardiac shadow is accentuated by the portable technique. Aortic calcifications are again seen. Right-sided chest tube is noted as well as postsurgical changes in the right lung. The overall inspiratory effort is poor.  Subcutaneous emphysema is seen. No pneumothorax is noted. IMPRESSION: No pneumothorax following right-sided VATS. Electronically Signed   By: Inez Catalina M.D.   On: 02/19/2018 13:42    Discharge Medications: Allergies as of 02/21/2018      Reactions   Lidocaine Hcl Anaphylaxis, Other (See Comments)   Xylocaine   Xylocaine [lidocaine] Anaphylaxis   Codeine Nausea And Vomiting   Crestor [rosuvastatin Calcium] Other (See Comments)   All-over body aches Myalgias    Statins Other (See Comments)   Muscle soreness and an aching feeling all over Myalgias   Percocet [oxycodone-acetaminophen] Itching      Medication List    TAKE these medications   acetaminophen 500 MG tablet Commonly known as:  TYLENOL Take 1,000 mg by mouth every 6 (six) hours as needed for mild pain or headache.   AEROCHAMBER MV inhaler Use as instructed   albuterol 108 (90 Base) MCG/ACT inhaler Commonly known as:  PROVENTIL HFA;VENTOLIN HFA Inhale 2 puffs into the lungs every 4 (four) hours as needed for wheezing or shortness of breath.   CARTIA XT 240 MG 24 hr capsule Generic drug:  diltiazem TAKE ONE CAPSULE BY MOUTH DAILY What changed:  how much to take   cetirizine 10 MG tablet Commonly known as:  ZYRTEC Take 10 mg by mouth daily as needed for allergies.   diphenoxylate-atropine 2.5-0.025 MG tablet Commonly known as:  LOMOTIL Take 1 tablet by mouth 4 (four) times daily as needed for diarrhea or loose stools.   ELIQUIS 5 MG Tabs tablet Generic drug:  apixaban TAKE ONE TABLET (5MG  TOTAL) BY MOUTH TWODAILY What changed:  See the new instructions.   ezetimibe 10 MG tablet Commonly known as:  ZETIA Take 1 tablet (10 mg total) by mouth daily.   Fish Oil 1000 MG Caps take 2 capsules daily What changed:    how much to take  how to take this  when to take this  additional instructions   fluticasone 50 MCG/ACT nasal spray Commonly known as:  FLONASE Place 2 sprays into both nostrils daily as  needed for allergies or rhinitis.   metFORMIN 500 MG tablet Commonly known as:  GLUCOPHAGE Take 500 mg by mouth at bedtime.   metoprolol tartrate 25 MG tablet Commonly known as:  LOPRESSOR Take 1 tablet (25 mg total) by mouth 2 (two) times daily.   MSM 1000 MG Caps Take 1,000 mg by mouth daily.   ondansetron 4 MG tablet Commonly known as:  ZOFRAN Take 4 mg by mouth every 8 (eight) hours as needed for nausea or vomiting.   predniSONE 5 MG tablet Commonly known as:  DELTASONE Take 5 mg by mouth daily.   tamsulosin 0.4 MG Caps capsule Commonly known as:  FLOMAX Take 0.4 mg by mouth 2 (two) times daily.   traMADol 50 MG tablet Commonly known as:  ULTRAM Take 1 tablet (50 mg total) by mouth every 6 (six) hours as needed (mild pain).   TRIPLE ANTIBIOTIC [phone] Oint Apply 1 application topically daily.       Follow Up Appointments: Follow-up Information    [name], MD. Go on  03/18/2018.   Specialty:  Cardiothoracic Surgery Why:  PA/LAT CXR to be taken (at [facility] which is in the same buidling as Dr. Leonarda Salon office) on 03/18/2018 at 72a;00 pm;Appointment time is at 3:30 pm Contact information: [place] [address] [phone]        Nurse. Go on 03/03/2018.   Why:  Appointment is with nurse only to have chest tube sutures removed. Appointment time is at 10:00 am Contact information: [place] [address]          Signed: Sharalyn Ink [facility] 02/21/2018, 10:03 AM

## 2018-02-20 NOTE — Progress Notes (Addendum)
      [address]       [place] [address]             [phone]       1 Day Post-Op Procedure(s) (LRB): VIDEO ASSISTED THORACOSCOPY (Right) LUNG BIOPSY (Right)  Subjective: Patient states he had jello last night and threw up. He denies nausea this am. He also coughed up a "blood ball".  Objective: Vital signs in last 24 hours: Temp:  [97.3 F (36.3 C)-98.7 F (37.1 C)] 97.4 F (36.3 C) (10/10 0412) Pulse Rate:  [79-94] 85 (10/10 0412) Cardiac Rhythm: Normal sinus rhythm (10/10 0429) Resp:  [10-34] 18 (10/10 0429) BP: (107-153)/(71-96) 150/78 (10/10 0412) SpO2:  [89 %-97 %] 97 % (10/10 0429) Arterial Line BP: (118-143)/(58-76) 135/76 (10/09 1600)      Intake/Output from previous day: 10/09 0701 - 10/10 0700 In: 3702.2 [P.O.:200; I.V.:2953.8; IV Piggyback:398.4] Out: 1443 [Urine:1275; Blood:50; Chest Tube:118]   Physical Exam:  Cardiovascular: RRR Pulmonary: Mostly clear Abdomen: Soft, non tender, bowel sounds present. Extremities: Trace bilateral lower extremity edema. Wounds: Clean and dry.  No erythema or signs of infection. Chest Tube: to suction, no air leak  Lab Results: CBC: Recent Labs    02/17/18 1404 02/20/18 0438  WBC 8.2 15.0*  HGB 16.8 15.0  HCT 47.9 43.1  PLT 188 184   BMET:  Recent Labs    02/17/18 1404 02/20/18 0438  NA 136 136  K 3.6 4.4  CL 103 104  CO2 24 25  GLUCOSE 136* 153*  BUN 11 8  CREATININE 0.99 0.66  CALCIUM 8.7* 8.2*    PT/INR:  Recent Labs    02/17/18 1404  LABPROT 13.2  INR 1.01   ABG:  INR: Will add last result for INR, ABG once components are confirmed Will add last 4 CBG results once components are confirmed  Assessment/Plan:  1. CV - History of PAF. SR. Hypertensive. On Cardizem CD120 mg daily and Lopressor 12.5 mg bid. Will increase Lopressor to pre op dose of 25 mg bid. Will need to resume Apixaban at some point. 2.  Pulmonary - Chest tube with 118 cc since surgery. Chest  tube is to suction and there is no air leak. CXR this am appears to show minor subcutaneous emphysema right lateral chest wall, no pneumothorax. Hope to place chest tube to water seal. Mucinex for coughEncourage incentive spirometer 3. Remove a line, foley 4. Decrease IVF  5. GI-slowly advance diet as long as without nausea and vomiting 6. DM-CBGs 152/201/145. Will not restart Metformin until taking po better  Donielle M ZimmermanPA-C 02/20/2018,7:45 AM [phone] Patient seen and examined, agree with above CT to water seal. Repeat CXR at noon- if OK will dc chest tube  Remo Lipps C. Roxan Hockey, MD [facility] [phone]

## 2018-02-21 ENCOUNTER — Inpatient Hospital Stay (HOSPITAL_COMMUNITY): Payer: PPO

## 2018-02-21 LAB — GLUCOSE, CAPILLARY
Glucose-Capillary: 112 mg/dL — ABNORMAL HIGH (ref 70–99)
Glucose-Capillary: 212 mg/dL — ABNORMAL HIGH (ref 70–99)
Glucose-Capillary: 155 mg/dL — ABNORMAL HIGH (ref 70–99)

## 2018-02-21 LAB — COMPREHENSIVE METABOLIC PANEL
ALT: 101 U/L — ABNORMAL HIGH (ref 0–44)
AST: 51 U/L — ABNORMAL HIGH (ref 15–41)
Albumin: 2.8 g/dL — ABNORMAL LOW (ref 3.5–5.0)
Alkaline Phosphatase: 60 U/L (ref 38–126)
Anion gap: 4 — ABNORMAL LOW (ref 5–15)
BUN: 13 mg/dL (ref 8–23)
CO2: 28 mmol/L (ref 22–32)
Calcium: 8.3 mg/dL — ABNORMAL LOW (ref 8.9–10.3)
Chloride: 108 mmol/L (ref 98–111)
Creatinine, Ser: 0.8 mg/dL (ref 0.61–1.24)
GFR calc Af Amer: >60 mL/min (ref >60)
GFR calc non Af Amer: >60 mL/min (ref >60)
Glucose, Bld: 118 mg/dL — ABNORMAL HIGH (ref 70–99)
Potassium: 3.6 mmol/L (ref 3.5–5.1)
Sodium: 140 mmol/L (ref 135–145)
Total Bilirubin: 1.3 mg/dL — ABNORMAL HIGH (ref 0.3–1.2)
Total Protein: 4.9 g/dL — ABNORMAL LOW (ref 6.5–8.1)

## 2018-02-21 LAB — CBC
HCT: 41.8 % (ref 39.0–52.0)
Hemoglobin: 14.2 g/dL (ref 13.0–17.0)
MCH: 35.8 pg — ABNORMAL HIGH (ref 26.0–34.0)
MCHC: 34 g/dL (ref 30.0–36.0)
MCV: 105.3 fL — ABNORMAL HIGH (ref 80.0–100.0)
Platelets: 151 10*3/uL (ref 150–400)
RBC: 3.97 MIL/uL — ABNORMAL LOW (ref 4.22–5.81)
RDW: 12.5 % (ref 11.5–15.5)
WBC: 13.2 10*3/uL — ABNORMAL HIGH (ref 4.0–10.5)
nRBC: 0 % (ref 0.0–0.2)

## 2018-02-21 MED ORDER — TRAMADOL HCL 50 MG PO TABS: 50.0000 mg | ORAL_TABLET | Freq: Four times a day (QID) | ORAL | 0 refills | Status: DC | PRN

## 2018-02-21 NOTE — Progress Notes (Signed)
2 Days Post-Op Procedure(s) (LRB): VIDEO ASSISTED THORACOSCOPY (Right) LUNG BIOPSY (Right) Subjective: No complaints this AM  Objective: Vital signs in last 24 hours: Temp:  [98 F (36.7 C)-99.2 F (37.3 C)] 98.3 F (36.8 C) (10/11 0333) Pulse Rate:  [77-98] 80 (10/11 0333) Cardiac Rhythm: Normal sinus rhythm (10/11 0333) Resp:  [14-22] 15 (10/11 0333) BP: (103-142)/(66-100) 113/77 (10/11 0333) SpO2:  [91 %-97 %] 94 % (10/11 0333) Arterial Line BP: (171)/(82) 171/82 (10/10 0813)  Hemodynamic parameters for last 24 hours:    Intake/Output from previous day: 10/10 0701 - 10/11 0700 In: 672 [P.O.:240; I.V.:432] Out: 9826 [Urine:3160; Chest Tube:355] Intake/Output this shift: No intake/output data recorded.  General appearance: alert, cooperative and no distress Neurologic: intact Heart: regular rate and rhythm Lungs: clear to auscultation bilaterally Abdomen: normal findings: soft, non-tender  Lab Results: Recent Labs    02/20/18 0438 02/21/18 0558  WBC 15.0* 13.2*  HGB 15.0 14.2  HCT 43.1 41.8  PLT 184 151   BMET:  Recent Labs    02/20/18 0438 02/21/18 0558  NA 136 140  K 4.4 3.6  CL 104 108  CO2 25 28  GLUCOSE 153* 118*  BUN 8 13  CREATININE 0.66 0.80  CALCIUM 8.2* 8.3*    PT/INR: No results for input(s): LABPROT, INR in the last 72 hours. ABG    Component Value Date/Time   PHART 7.452 (H) 02/20/2018 0430   HCO3 27.4 02/20/2018 0430   TCO2 29 09/25/2016 1713   O2SAT 95.4 02/20/2018 0430   CBG (last 3)  Recent Labs    02/20/18 1749 02/21/18 0046 02/21/18 0602  GLUCAP 150* 212* 112*    Assessment/Plan: S/P Procedure(s) (LRB): VIDEO ASSISTED THORACOSCOPY (Right) LUNG BIOPSY (Right) -POD # 2 doing well Wants to go home CXR OK- some SQ emphysema, no pneumothorax Will see how he does this morning. Possible DC later today   LOS: 2 days    [name] 02/21/2018

## 2018-02-21 NOTE — Progress Notes (Signed)
Central line removed without difficulty, patient instructed to lie down for 30 minutes post removal. Discharge instructions and medications reviewed with patient and wife, questions answered.

## 2018-02-21 NOTE — Consult Note (Signed)
            [facility] CM Primary Care Navigator  02/21/2018  Odin 1945-09-12 [id number]   Went to see patient at the bedside to identify possible discharge needs buthewasalreadydischargedhomeper staff.  Per MD note,patient had been having issues with shortness of breath over the past couple years but his symptoms have progressed, his symptoms improve each time but since he is off the prednisone his symptoms recur. (ILD- interstitial lung disease, status post right video- assisted thoracoscopy, lung biopsy x 4)   Patient has discharge instruction to follow-up withcardiothoracic surgery on 03/18/18 and with nurse to have chest tube sutures removed on 03/03/18.   Primary care provider's office called to notifyofpatient's discharge, need for post hospital follow-up and transition of care(TOC). Notified of patient's health issues needing follow-up. Made aware to refer patient to [facility] care management if deemed necessary and appropriate for any services.   For additional questions please contact:  Edwena Felty A. Kyona Chauncey, BSN, RN-BC [facility] PRIMARY CARE Navigator Cell: [phone]

## 2018-02-25 ENCOUNTER — Other Ambulatory Visit: Payer: Self-pay | Admitting: Cardiovascular Disease

## 2018-02-25 NOTE — Telephone Encounter (Signed)
Rx has been sent to the pharmacy electronically. ° °

## 2018-02-27 ENCOUNTER — Telehealth: Payer: Self-pay | Admitting: Pulmonary Disease

## 2018-02-27 NOTE — Telephone Encounter (Signed)
Called spoke with patient, advised biopsy results are not back yet as stated by BQ below. Patient voiced his understanding.   Did recommend patient to check again next week - he has an appt for staple removal with TCTS on 10.21.19 and will inquire then.  Nothing further needed at this time; will sign off.

## 2018-02-27 NOTE — Telephone Encounter (Signed)
Not back yet. 

## 2018-02-27 NOTE — Telephone Encounter (Signed)
Called and spoke with pt who is wanting to know if the results have come back from the lung biopsy.  Pt had the biopsy performed by Dr. Roxan Hockey 02/19/18  [name] please advise on this for pt. Thanks!

## 2018-03-03 ENCOUNTER — Ambulatory Visit (INDEPENDENT_AMBULATORY_CARE_PROVIDER_SITE_OTHER)
Admission: RE | Admit: 2018-03-03 | Discharge: 2018-03-03 | Disposition: A | Discharge status: Home / Self Care | Payer: PPO | Source: Ambulatory Visit | Attending: Pulmonary Disease | Admitting: Pulmonary Disease

## 2018-03-03 ENCOUNTER — Encounter: Payer: Self-pay | Admitting: Pulmonary Disease

## 2018-03-03 ENCOUNTER — Ambulatory Visit (INDEPENDENT_AMBULATORY_CARE_PROVIDER_SITE_OTHER): Payer: PPO | Admitting: Pulmonary Disease

## 2018-03-03 ENCOUNTER — Encounter (INDEPENDENT_AMBULATORY_CARE_PROVIDER_SITE_OTHER): Payer: Self-pay

## 2018-03-03 VITALS — BP 110/70 | HR 97 | Temp 98.6°F | Ht 69.0 in | Wt 180.6 lb

## 2018-03-03 DIAGNOSIS — R05 Cough: Secondary | ICD-10-CM | POA: Diagnosis not present

## 2018-03-03 DIAGNOSIS — J849 Interstitial pulmonary disease, unspecified: Secondary | ICD-10-CM

## 2018-03-03 DIAGNOSIS — Z4802 Encounter for removal of sutures: Secondary | ICD-10-CM

## 2018-03-03 DIAGNOSIS — R059 Cough, unspecified: Secondary | ICD-10-CM

## 2018-03-03 LAB — NITRIC OXIDE: Nitric Oxide: 8

## 2018-03-03 MED ORDER — BUDESONIDE-FORMOTEROL FUMARATE 80-4.5 MCG/ACT IN AERO: 2.0000 | INHALATION_SPRAY | Freq: Two times a day (BID) | RESPIRATORY_TRACT | 0 refills | Status: DC

## 2018-03-03 NOTE — Progress Notes (Signed)
Your chest x-ray results of come back.  Showing no acute changes. Continue Right basilar atelectasis. No changes.  No plan of care changes at this time.  Keep follow-up appointment.    Follow-up with our office if symptoms worsen or you do not feel like you are improving under her current regimen.  It was a pleasure taking care of you,  Wyn Quaker, FNP

## 2018-03-03 NOTE — Assessment & Plan Note (Signed)
Start Symbicort 80 >>> 2 puffs in the morning right when you wake up, rinse out your mouth after use, 12 hours later 2 puffs, rinse after use >>> Take this daily, no matter what >>> This is not a rescue inhaler   Prednisone 10mg  tablet  >>>4 tabs for 2 days, then 3 tabs for 2 days, 2 tabs for 2 days, then 1 tab for 2 days, then stop >>>take with food  >>>take in the morning   Chest Xray today   Keep follow-up with [name]

## 2018-03-03 NOTE — Patient Instructions (Addendum)
Start Symbicort 80 >>> 2 puffs in the morning right when you wake up, rinse out your mouth after use, 12 hours later 2 puffs, rinse after use >>> Take this daily, no matter what >>> This is not a rescue inhaler   Prednisone 10mg  tablet  >>>4 tabs for 2 days, then 3 tabs for 2 days, 2 tabs for 2 days, then 1 tab for 2 days, then stop >>>take with food  >>>take in the morning   Chest Xray today   Keep follow-up with [name]  November/2019 we will be moving! We will no longer be at our [place] location.  Be on the look out for a post card/mailer to let you know we have officially moved.  Our new address and phone number will be:  [address] [place], [place] [address] Telephone number: [phone]  It is flu season:   >>>Remember to be washing your hands regularly, using hand sanitizer, be careful to use around herself with has contact with people who are sick will increase her chances of getting sick yourself. >>> Best ways to protect herself from the flu: Receive the yearly flu vaccine, practice good hand hygiene washing with soap and also using hand sanitizer when available, eat a nutritious meals, get adequate rest, hydrate appropriately   Please contact the office if your symptoms worsen or you have concerns that you are not improving.   Thank you for choosing [facility] for your healthcare, and for allowing [place] to partner with you on your healthcare journey. I am thankful to be able to provide care to you today.   Wyn Quaker FNP-C

## 2018-03-03 NOTE — Progress Notes (Signed)
@Patient  ID: [name], male    DOB: 08/19/1945, 72 y.o.   MRN: [id number]  Chief Complaint  Patient presents with  . Acute Visit    SOB, f/u from biopsy    Referring provider: [name], MD  HPI:  72 year old male patient followed in our office for interstitial lung disease  PMH: paroxysmal atrial fibrillation, viral myocarditis, polymyalgia rheumatica, gastroesophageal reflux and hyperlipidemia Smoker/ Smoking History:  Maintenance: None Pt of: [name]   Recent [place] [facility] Encounters:   01/10/2018-office visit- Mcquaid 72 year old patient presenting today for follow-up visit after completing PFT.  Patient has had multiple rounds of prednisone which he says is always somewhat helpful.  Patient is also been taking albuterol which is been helpful.  Constant complaints of shortness of breath.  With a dry cough.  Plan:  Lab work today: Sed rate, aldolase, J1 antibody IgG, rheumatoid factor, Sjogren's syndrome antibodies, centromere antibodies, anti-scleroderma antibody, anti-scleroderma antibody, CCP antibody, IgE, hypersensitivity pneumonitis, ANA. Modified barium swallow, present case at interstitial lung disease conference to enter determine if we think an open lung biopsy would be necessary to make a diagnosis, Prevnar, flu vaccine next office visit, continue albuterol, add spacer, follow-up in 2 to 4 weeks   03/03/2018  - Visit   72 year old male patient presenting today for acute visit.  Patient reporting that since lung biopsy/right VATS on 02/18/2018 patient has had increased shortness of breath as well as increased dry cough.  Patient has follow-up scheduled for next week to discuss results of biopsy with [name].  Patient reports that he is having persistent symptoms, this is similar to how he is felt before, patient reports he traditionally does well on prednisone.  Patient is wondering if there is any other therapies to help address his breathing  besides prednisone.  As he knows the prednisone affects his blood sugars.  Patient denies hemoptysis, denies other acute symptoms, denies fever, denies chills.  Patient reports soreness on right side when sleeping due to biopsy site.  Patient reports he sleeps better on left side.  Patient does admit to occasional postnasal drip and slight nasal congestion.  Patient has environmental allergies and this is common for him at this time a year.  Patient has been using his Flonase as well as daily antihistamine.    Tests:  Imaging: November 2017 high-resolution CT scan of the chest images personally reviewed showing patchy groundglass bilaterally which appears predominantly to be patchy lower lobe atelectasis  Barium swallow from December 2017 no evidence of esophageal disorder. January 2018 high-resolution CT scan of the chest showed mild basilar predominant subpleural reticulation and groundglass suggesting nonspecific interstitial pneumonitis. (images independently reviewed 01/10/2018) July 2019 HRCT> images independently reviewed showing fibrotic changes in the bases of the lungs in a nonspecific pattern, not consistent with UIP.  Records from her visit earlier this month with our nurse practitioner reviewed where he was seen for shortness of breath.  Prednisone and a rescue inhaler were prescribed  blood work  November 2017 reviewed: CRP 139, total CK normal, antineutrophil cytoplasmic antibody testing negative   Pulmonary function testing 04/2016 Ratio 83%, FEV1 3.02 L 93%, FVC 3.06 L , total lung capacity 5. 12/17/1982 percent protected, DLCO 23.9 75% predicted  August 2019 pulmonary function testing ratio normal, FVC 3.53 L 83% predicted, total lung capacity 6.25 L 106% predicted, residual volume 155% predicted, DLCO 21.2 mL 68% predicted   FENO:  Lab Results  Component Value Date   NITRICOXIDE 8  03/03/2018    PFT: PFT Results Latest Ref Rng & Units 01/10/2018 10/26/2016 05/03/2016    FVC-Pre L 3.52 4.21 3.55  FVC-Predicted Pre % 82 96 80  FVC-Post L 3.53 4.08 3.65  FVC-Predicted Post % 83 93 83  Pre FEV1/FVC % % 81 78 81  Post FEV1/FCV % % 82 81 83  FEV1-Pre L 2.85 3.30 2.88  FEV1-Predicted Pre % 91 102 88  DLCO UNC% % 68 84 75  DLCO COR %Predicted % 81 87 91  TLC L 7.25 6.79 5.85  TLC % Predicted % 106 98 84  RV % Predicted % 155 102 94    Imaging: Dg Chest 2 View  Result Date: 03/03/2018 CLINICAL DATA:  Persistent cough, congestion, and shortness of breath for the past 2 months. EXAM: CHEST - 2 VIEW COMPARISON:  Chest x-ray dated February 21, 2018. FINDINGS: The heart size and mediastinal contours are within normal limits. Normal pulmonary vascularity. Atherosclerotic calcification of the aortic arch. Postsurgical changes in the right lung with right basilar atelectasis/scarring. No focal consolidation, pleural effusion, or pneumothorax. No acute osseous abnormality. Resolved right chest wall subcutaneous emphysema. IMPRESSION: Right basilar atelectasis/scarring. No active cardiopulmonary disease. Electronically Signed   By: Titus Dubin M.D.   On: 03/03/2018 11:52   Dg Chest 2 View  Result Date: 02/21/2018 CLINICAL DATA:  Pneumothorax post RIGHT VATS EXAM: CHEST - 2 VIEW COMPARISON:  Portable exam 0658 hours compared to 02/20/2018 FINDINGS: RIGHT jugular line with tip projecting over SVC. Upper normal heart size. Mediastinal contours and pulmonary vascularity normal. Atherosclerotic calcification aorta. Postsurgical changes in the RIGHT hemithorax with persistent RIGHT basilar atelectasis. No definite infiltrate, pleural effusion or pneumothorax. Bones demineralized. IMPRESSION: Persistent RIGHT basilar atelectasis. Electronically Signed   By: Lavonia Dana M.D.   On: 02/21/2018 10:39   Dg Chest 2 View  Result Date: 02/17/2018 CLINICAL DATA:  Pre-admission for lung biopsy. EXAM: CHEST - 2 VIEW COMPARISON:  11/26/2017.  Chest CT, 12/06/2017. FINDINGS: The heart  size and mediastinal contours are within normal limits. Both lungs are clear. No pleural effusion or pneumothorax. The visualized skeletal structures are unremarkable. IMPRESSION: No active cardiopulmonary disease. Electronically Signed   By: Lajean Manes M.D.   On: 02/17/2018 17:24   Dg Chest 1v Repeat Same Day  Result Date: 02/20/2018 CLINICAL DATA:  Interstitial lung disease, chest tube EXAM: CHEST - 1 VIEW SAME DAY COMPARISON:  02/20/2018 FINDINGS: Right central line and right chest tube remain in place, unchanged. No visible pneumothorax. Postoperative changes at the right lung base. Subcutaneous emphysema in the right chest wall is stable. Right base atelectasis. Mild cardiomegaly. No focal opacity or effusion on the left. IMPRESSION: Right chest tube remains in place with postoperative changes on the right. No visible pneumothorax. Right base atelectasis. Mild cardiomegaly. Electronically Signed   By: Rolm Baptise M.D.   On: 02/20/2018 14:11   Dg Chest Port 1 View  Result Date: 02/20/2018 CLINICAL DATA:  Post chest tube removal right lung. Assess for pneumothorax. EXAM: PORTABLE CHEST 1 VIEW COMPARISON:  02/20/2018 at 1339 hours FINDINGS: Removal of right-sided chest tube with residual subcutaneous emphysema along the right lateral chest wall at site of chest tube insertion. No apparent pneumothorax. Lung volumes are low. Right IJ central line catheter is noted with tip terminating in the distal SVC. Stable cardiomegaly with aortic atherosclerosis. Bibasilar atelectasis is seen IMPRESSION: 1. No pneumothorax after right-sided chest tube removal. Low lung volumes with bibasilar atelectasis noted. 2. Cardiomegaly with aortic  atherosclerosis. 3. Expected soft tissue emphysema at site of prior chest tube insertion. Electronically Signed   By: Ashley Royalty M.D.   On: 02/20/2018 16:11   Dg Chest Port 1 View  Result Date: 02/20/2018 CLINICAL DATA:  Follow-up chest tube EXAM: PORTABLE CHEST 1 VIEW  COMPARISON:  02/19/2018 FINDINGS: Right-sided thoracostomy catheter is again identified. No sizable pneumothorax is noted. Right jugular central line is seen and stable. Postsurgical changes in the right base are noted. The left lung remains clear. Cardiac shadow is stable. IMPRESSION: No evidence of pneumothorax. Tubes and lines as described. Electronically Signed   By: Inez Catalina M.D.   On: 02/20/2018 11:04   Dg Chest Port 1 View  Result Date: 02/19/2018 CLINICAL DATA:  Status post right VATS EXAM: PORTABLE CHEST 1 VIEW COMPARISON:  02/17/2018 FINDINGS: Right jugular central line is noted in satisfactory position. Cardiac shadow is accentuated by the portable technique. Aortic calcifications are again seen. Right-sided chest tube is noted as well as postsurgical changes in the right lung. The overall inspiratory effort is poor. Subcutaneous emphysema is seen. No pneumothorax is noted. IMPRESSION: No pneumothorax following right-sided VATS. Electronically Signed   By: Inez Catalina M.D.   On: 02/19/2018 13:42   Chart Review:  02/18/2018- right VATS, lung biopsy 02/19/2018-discharged from hospital   Specialty Problems      Pulmonary Problems   ILD (interstitial lung disease) ([place])    04/2016 ANCA, Aldolase, CK, negative 05/2016 (off steroids)> ANA negative, CCP negative, SCL 70 negative, centromere negative, SSA/SSB negative, RF slightly elevated, Hypersensitivity pneumonitis panel negative 05/2016 HRCT > Mild subpleural reticulation and ground glass suggestive of NSIP, cannot exclude UIP      Snoring   OSA (obstructive sleep apnea)    HST 11/2016 Moderate OSA -AHI 27/hr , min  O2 desats 81%. >rec CPAP  Will need ONO on CPAP to make sure desats resolve.           Allergies  Allergen Reactions  . Lidocaine Hcl Anaphylaxis and Other (See Comments)    Xylocaine  . Xylocaine [Lidocaine] Anaphylaxis  . Codeine Nausea And Vomiting  . Crestor [Rosuvastatin Calcium] Other (See Comments)     All-over body aches Myalgias   . Statins Other (See Comments)    Muscle soreness and an aching feeling all over Myalgias  . Percocet [Oxycodone-Acetaminophen] Itching    Immunization History  Administered Date(s) Administered  . Influenza Split 03/20/2016  . Influenza, High Dose Seasonal PF 01/29/2018  . Influenza,inj,Quad PF,6+ Mos 03/12/2017  . Pneumococcal Conjugate-13 01/10/2018    Past Medical History:  Diagnosis Date  . Arthritis   . Chest pain    a. 03/2017: echo showing EF of 60-65%, no regional WMA or significant valve abnormalities. b. 03/2016: NST with no evidence of ischemia.   . Digestive problems    on Prednisone prn for this issue- diarrhea  . Dyspnea    with activity  . Dysrhythmia    irregular due to Myocarditis- takes Atenolol, Norvasc  . GERD (gastroesophageal reflux disease)   . H/O viral myocarditis    25 years ago  . Head injury, closed, with concussion    Breif LOC  . Hyperlipidemia   . Mild mitral valve prolapse    per Dr Evette Georges notes  . PAF (paroxysmal atrial fibrillation) ([place])    a. initially occuring in 02/2016. b. recurrent in 07/2016. Placed on Eliquis  . Polymyalgia rheumatica ([place])   . Pre-diabetes   . Sleep apnea  mild sleep apnea   no CPAP    Tobacco History: Social History   Tobacco Use  Smoking Status Never Smoker  Smokeless Tobacco Never Used   Counseling given: Yes  Continue not smoking  Outpatient Encounter Medications as of 03/03/2018  Medication Sig  . acetaminophen (TYLENOL) 500 MG tablet Take 1,000 mg by mouth every 6 (six) hours as needed for mild pain or headache.   . albuterol (PROVENTIL HFA;VENTOLIN HFA) 108 (90 Base) MCG/ACT inhaler Inhale 2 puffs into the lungs every 4 (four) hours as needed for wheezing or shortness of breath.   . CARTIA XT 240 MG 24 hr capsule TAKE ONE CAPSULE BY MOUTH DAILY (Patient taking differently: Take 240 mg by mouth daily. )  . cetirizine (ZYRTEC) 10 MG tablet Take 10 mg by mouth  daily as needed for allergies.   . diphenoxylate-atropine (LOMOTIL) 2.5-0.025 MG tablet Take 1 tablet by mouth 4 (four) times daily as needed for diarrhea or loose stools.   Marland Kitchen ELIQUIS 5 MG TABS tablet TAKE ONE TABLET (5MG  TOTAL) BY MOUTH TWODAILY  . fluticasone (FLONASE) 50 MCG/ACT nasal spray Place 2 sprays into both nostrils daily as needed for allergies or rhinitis.  . metFORMIN (GLUCOPHAGE) 500 MG tablet Take 500 mg by mouth at bedtime.   . Methylsulfonylmethane (MSM) 1000 MG CAPS Take 1,000 mg by mouth daily.   . metoprolol tartrate (LOPRESSOR) 25 MG tablet TAKE ONE TABLET BY MOUTH TWICE A DAY  . Omega-3 Fatty Acids (FISH OIL) 1000 MG CAPS take 2 capsules daily (Patient taking differently: Take 500 mg by mouth 2 (two) times daily. )  . predniSONE (DELTASONE) 5 MG tablet Take 5 mg by mouth daily.   Marland Kitchen Spacer/Aero-Holding Chambers (AEROCHAMBER MV) inhaler Use as instructed  . tamsulosin (FLOMAX) 0.4 MG CAPS capsule Take 0.4 mg by mouth 2 (two) times daily.   . budesonide-formoterol (SYMBICORT) 80-4.5 MCG/ACT inhaler Inhale 2 puffs into the lungs 2 (two) times daily.  Marland Kitchen ezetimibe (ZETIA) 10 MG tablet Take 1 tablet (10 mg total) by mouth daily.  Marland Kitchen Neomycin-Bacitracin-Polymyxin (TRIPLE ANTIBIOTIC) [phone] OINT Apply 1 application topically daily.  . ondansetron (ZOFRAN) 4 MG tablet Take 4 mg by mouth every 8 (eight) hours as needed for nausea or vomiting.  . traMADol (ULTRAM) 50 MG tablet Take 1 tablet (50 mg total) by mouth every 6 (six) hours as needed (mild pain). (Patient not taking: Reported on 03/03/2018)   No facility-administered encounter medications on file as of 03/03/2018.     Review of Systems  Review of Systems  Constitutional: Positive for activity change, appetite change and fatigue. Negative for chills, fever and unexpected weight change.  HENT: Positive for postnasal drip. Negative for congestion, rhinorrhea, sinus pressure, sinus pain, sneezing and sore throat.   Eyes:  Negative.   Respiratory: Positive for cough and shortness of breath. Negative for wheezing.   Cardiovascular: Negative for chest pain and palpitations.  Gastrointestinal: Negative for constipation, diarrhea, nausea and vomiting.  Endocrine: Negative.   Musculoskeletal: Negative.   Skin: Negative.   Allergic/Immunologic: Positive for environmental allergies.  Neurological: Negative for dizziness and headaches.  Psychiatric/Behavioral: Negative.  Negative for dysphoric mood and sleep disturbance. The patient is not nervous/anxious.   All other systems reviewed and are negative.   Physical Exam  BP 110/70 (BP Location: Left Arm, Cuff Size: Normal)   Pulse 97   Temp 98.6 F (37 C) (Oral)   Ht 5\' 9"  (1.753 m)   Wt 180 lb 9.6 oz (81.9  kg)   SpO2 94%   BMI 26.67 kg/m   Wt Readings from Last 5 Encounters:  03/03/18 180 lb 9.6 oz (81.9 kg)  02/19/18 186 lb 11.2 oz (84.7 kg)  02/17/18 186 lb 11.2 oz (84.7 kg)  02/11/18 183 lb (83 kg)  02/04/18 183 lb 3.2 oz (83.1 kg)     Physical Exam  Constitutional: He is oriented to person, place, and time and well-developed, well-nourished, and in no distress. No distress.  HENT:  Head: Normocephalic and atraumatic.  Right Ear: Hearing, external ear and ear canal normal.  Left Ear: Hearing, external ear and ear canal normal.  Nose: Mucosal edema present. Right sinus exhibits no maxillary sinus tenderness and no frontal sinus tenderness. Left sinus exhibits no maxillary sinus tenderness and no frontal sinus tenderness.  Mouth/Throat: Uvula is midline and oropharynx is clear and moist. No oropharyngeal exudate.  +PND, small effusions on TMs bilaterally without infection   Eyes: Pupils are equal, round, and reactive to light.  Neck: Normal range of motion. Neck supple. No JVD present.  Cardiovascular: Normal rate, regular rhythm and normal heart sounds.  Pulmonary/Chest: Effort normal and breath sounds normal. No accessory muscle usage. No  respiratory distress. He has no decreased breath sounds. He has no wheezes. He has no rhonchi. He has no rales.  Patient is visually short of breath after completing lung exam, deep breaths occasionally elicit dry cough  Abdominal: Soft. Bowel sounds are normal. There is no tenderness.  Musculoskeletal: Normal range of motion. He exhibits no edema.  Lymphadenopathy:    He has no cervical adenopathy.  Neurological: He is alert and oriented to person, place, and time. Gait normal.  Skin: Skin is warm and dry. He is not diaphoretic. No erythema.  Psychiatric: Mood, memory, affect and judgment normal.  Nursing note and vitals reviewed.     Lab Results:  CBC    Component Value Date/Time   WBC 13.2 (H) 02/21/2018 0558   RBC 3.97 (L) 02/21/2018 0558   HGB 14.2 02/21/2018 0558   HCT 41.8 02/21/2018 0558   PLT 151 02/21/2018 0558   MCV 105.3 (H) 02/21/2018 0558   MCH 35.8 (H) 02/21/2018 0558   MCHC 34.0 02/21/2018 0558   RDW 12.5 02/21/2018 0558   LYMPHSABS 1.7 12/16/2017 1030   MONOABS 0.6 12/16/2017 1030   EOSABS 0.1 12/16/2017 1030   BASOSABS 0.1 12/16/2017 1030    BMET    Component Value Date/Time   NA 140 02/21/2018 0558   K 3.6 02/21/2018 0558   CL 108 02/21/2018 0558   CO2 28 02/21/2018 0558   GLUCOSE 118 (H) 02/21/2018 0558   BUN 13 02/21/2018 0558   CREATININE 0.80 02/21/2018 0558   CREATININE 0.95 07/16/2016 0902   CALCIUM 8.3 (L) 02/21/2018 0558   GFRNONAA >60 02/21/2018 0558   GFRAA >60 02/21/2018 0558    BNP    Component Value Date/Time   BNP 53.0 04/02/2016 1435    ProBNP No results found for: PROBNP    Assessment & Plan:   Pleasant 72 year old male patient presenting for acute visit today.  Concern for Mr. Sagona as he is visibly short of breath today on exam.  FeNO today is normal.  We will get chest x-ray just to ensure that he is not having a worsening pneumothorax.  Will give patient a therapeutic trial of low-dose ICS, Symbicort 80 sample  provided today.  If patient since his improvement with breathing then we could consider continuing this.  PFTs  with slight mid flow reversibility after albuterol use.  We will give patient taper of prednisone.  Patient to keep follow-up with [name] next week to review biopsy results at that time.  Explained to patient is very important that he lets [place] know how he is doing, if symptoms worsen he can present to an emergency room or contact our office.  Patient and wife agree.  ILD (interstitial lung disease) ([facility]) Start Symbicort 80 >>> 2 puffs in the morning right when you wake up, rinse out your mouth after use, 12 hours later 2 puffs, rinse after use >>> Take this daily, no matter what >>> This is not a rescue inhaler   Prednisone 10mg  tablet  >>>4 tabs for 2 days, then 3 tabs for 2 days, 2 tabs for 2 days, then 1 tab for 2 days, then stop >>>take with food  >>>take in the morning   Chest Xray today   Keep follow-up with [name]   This appointment was 32 minutes along with her 50% that time direct face-to-face patient care, assessment, plan of care follow-up  Lauraine Rinne, NP 03/03/2018

## 2018-03-03 NOTE — Progress Notes (Signed)
Gave the patient a taper today. I will contact him tomorrow to change to 20mg  daily until your office visit with him.   Thanks,   Aaron Edelman

## 2018-03-03 NOTE — Progress Notes (Signed)
Reviewed, agree Would place on prednisone until I see him 20mg  daily  BJ, Can you call pathology to see if results are available? Ruby Cola

## 2018-03-03 NOTE — Progress Notes (Signed)
Discussed results with patient in office.  Nothing further is needed at this time.  Brian Mack FNP  

## 2018-03-04 ENCOUNTER — Telehealth: Payer: Self-pay | Admitting: *Deleted

## 2018-03-04 MED ORDER — PREDNISONE 10 MG PO TABS: 10.0000 mg | ORAL_TABLET | Freq: Every day | ORAL | 0 refills | Status: DC

## 2018-03-04 NOTE — Telephone Encounter (Signed)
Called and spoke to patient. Let him know to take 20mg  prednisone daily as instructed by Wyn Quaker FNP.   Patient verified pharmacy and voiced understanding to take this dose until he see [name] back in our office.   Nothing further needed at this time.

## 2018-03-04 NOTE — Progress Notes (Signed)
thanks

## 2018-03-05 ENCOUNTER — Telehealth: Payer: Self-pay | Admitting: Pulmonary Disease

## 2018-03-05 ENCOUNTER — Other Ambulatory Visit: Payer: Self-pay | Admitting: Cardiovascular Disease

## 2018-03-05 NOTE — Telephone Encounter (Signed)
I called [name] to let him know that his biopsy showed early UIP.   He needs to start Ofev 150mg  po bid, will route to office to start process for this. He also needs to be referred to the PFF support group. I advised he decrease prednisone to 10mg  daily tomorrow and maintain that dose until he sees me next week. He voiced understanding

## 2018-03-05 NOTE — Telephone Encounter (Signed)
I have started the [facility] application as well as patient assistance paperwork for pt.  Called and spoke with pt letting him know there is a place on the patient assistance application that he needs to sign. Pt expressed understanding and stated he would come by when he could to sign the application.

## 2018-03-06 ENCOUNTER — Telehealth: Payer: Self-pay | Admitting: Pulmonary Disease

## 2018-03-06 NOTE — Telephone Encounter (Signed)
Form signed by the pt and returned to [place]

## 2018-03-12 ENCOUNTER — Other Ambulatory Visit: Payer: PPO

## 2018-03-12 ENCOUNTER — Ambulatory Visit: Payer: PPO | Admitting: Nurse Practitioner

## 2018-03-12 ENCOUNTER — Telehealth: Payer: Self-pay | Admitting: Pulmonary Disease

## 2018-03-12 ENCOUNTER — Encounter: Payer: Self-pay | Admitting: Pulmonary Disease

## 2018-03-12 ENCOUNTER — Ambulatory Visit (INDEPENDENT_AMBULATORY_CARE_PROVIDER_SITE_OTHER): Payer: PPO | Admitting: Pulmonary Disease

## 2018-03-12 VITALS — BP 116/78 | HR 88 | Ht 69.29 in | Wt 181.0 lb

## 2018-03-12 DIAGNOSIS — J438 Other emphysema: Secondary | ICD-10-CM

## 2018-03-12 DIAGNOSIS — R0602 Shortness of breath: Secondary | ICD-10-CM | POA: Diagnosis not present

## 2018-03-12 DIAGNOSIS — R1310 Dysphagia, unspecified: Secondary | ICD-10-CM

## 2018-03-12 DIAGNOSIS — J849 Interstitial pulmonary disease, unspecified: Secondary | ICD-10-CM | POA: Diagnosis not present

## 2018-03-12 NOTE — Telephone Encounter (Signed)
Forms completed and faxed, nothing further needed.

## 2018-03-12 NOTE — Telephone Encounter (Signed)
Forms completed and faxed. Nothing further needed.

## 2018-03-12 NOTE — Telephone Encounter (Signed)
LMTCB for pt. BQ has blocked spots on 04/14/18. Spoke with Burman Nieves, ok to use one of those held spots.

## 2018-03-12 NOTE — Telephone Encounter (Signed)
Patient returned call, scheduled for 12/02 at 11:15 am.  No call back needed.

## 2018-03-12 NOTE — Progress Notes (Signed)
Subjective:    [name], male    DOB: Jan 22, 1946, 72 y.o.   MRN: [id number]  Synopsis: Referred in November 2017 for evaluation of shortness of breath.  Found to have diffuse parenchymal lung disease of undetermined cause. An open lung biopsy performed on 02/19/2018 showed an early fibrotic process suggestive of UIP.  He only smoked cigarettes for about 3 to 6 months when he was a teenager.  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   HPI Chief Complaint  Patient presents with  . Follow-up    review path results   [name] returns to clinic today to discuss the results from his recent open lung biopsy which showed early fibrotic changes suggestive of UIP as well as emphysema.  He tells me today that he is slowly feeling better but he does still have some fatigue and shortness of breath.  He has not been exercising much lately.  He tried to go fishing yesterday and he says that any walking was quite difficult for him.  He says that he is not coughing too much right now.  He is taking prednisone still as directed by her clinic earlier this month and he is wondering what to do with that.  He is still taking a bronchodilator.  He tells me today that he only smokes cigarettes for about 3 to 6 months when he was a teenager.  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  Past Medical History:  Diagnosis Date  . Arthritis   . Chest pain    a. 03/2017: echo showing EF of 60-65%, no regional WMA or significant valve abnormalities. b. 03/2016: NST with no evidence of ischemia.   . Digestive problems    on Prednisone prn for this issue- diarrhea  . Dyspnea    with activity  . Dysrhythmia    irregular due to Myocarditis- takes Atenolol, Norvasc  . GERD (gastroesophageal reflux disease)   . H/O viral  myocarditis    25 years ago  . Head injury, closed, with concussion    Breif LOC  . Hyperlipidemia   . Mild mitral valve prolapse    per Dr Evette Georges notes  . PAF (paroxysmal atrial fibrillation) ([place])    a. initially occuring in 02/2016. b. recurrent in 07/2016. Placed on Eliquis  . Polymyalgia rheumatica ([place])   . Pre-diabetes   . Sleep apnea    mild sleep apnea   no CPAP     Review of Systems  Constitutional: Positive for fatigue. Negative for chills, diaphoresis and fever.  HENT: Negative for rhinorrhea, sinus pressure and sinus pain.   Respiratory: Positive for shortness of breath. Negative for cough and wheezing.   Cardiovascular: Negative for chest pain and leg swelling.       Objective:   Physical Exam  Vitals:   03/12/18 1014  BP: 116/78  Pulse: 88  SpO2: 95%  Weight: 181 lb (82.1 kg)  Height: 5' 9.29" (1.76 m)  RA   Gen: well appearing HENT: OP clear, TM's clear, neck supple PULM: Few crackles left base CV: RRR, no mgr, trace edema GI: BS+, soft, nontender Derm: no cyanosis or rash Psyche: normal mood and affect     Imaging: November 2017 high-resolution CT scan of the chest images personally reviewed showing patchy groundglass bilaterally which  appears predominantly to be patchy lower lobe atelectasis  Barium swallow from December 2017 no evidence of esophageal disorder. January 2018 high-resolution CT scan of the chest showed mild basilar predominant subpleural reticulation and groundglass suggesting nonspecific interstitial pneumonitis. (images independently reviewed 03/12/2018) July 2019 HRCT> images independently reviewed showing fibrotic changes in the bases of the lungs in a nonspecific pattern, not consistent with UIP.  Records from her visit earlier this month with our nurse practitioner reviewed where he was seen for shortness of breath.  Prednisone and a rescue inhaler were prescribed  Blood work: November 2017 reviewed: CRP 139, total CK  normal, antineutrophil cytoplasmic antibody testing negative   Path: October 2019 open lung biopsy showed early fibrosis, the fibrosis has temporal heterogeneity there are fibroblastic foci and lymphoid aggregates, no honeycombing identified.  Pathology feels suggestive of early UIP but not definitive  Pulmonary function testing 04/2016 Ratio 83%, FEV1 3.02 L 93%, FVC 3.06 L , total lung capacity 5. 12/17/1982 percent protected, DLCO 23.9 75% predicted  August 2019 pulmonary function testing ratio normal, FVC 3.53 L 83% predicted, total lung capacity 6.25 L 106% predicted, residual volume 155% predicted, DLCO 21.2 mL 68% predicted    Assessment & Plan:   ILD (interstitial lung disease) ([place]) - Plan: Ambulatory referral to Pulmonology  Other emphysema ([place]) - Plan: Alpha-1 antitrypsin phenotype  Shortness of breath  Discussion: The histologic features from the biopsy are consistent with UIP: Temporal heterogeneity, fibroblastic foci, and lymphoid aggregates.  However, there was not distinct honeycombing so I think this is why pathology was uncomfortable describing this as consistent with UIP.  Because of diagnostic uncertainty from the lung pathologist here from [facility] health I am going to refer the pathology to [facility] lung pathology.  That all being said, this is consistent with IPF.  I am puzzled as to the finding of emphysema given his minimal smoking history.  He needs to have genetic testing sent to look for alpha-1 antitrypsin deficiency.  Plan: Finding of emphysema on lung pathology Continue albuterol as you are doing Continue Symbicort as you are doing I will send the blood test called alpha-1 antitrypsin to look for a genetic condition which can lead to this  Idiopathic pulmonary fibrosis: I am going to send your pathology report to [facility] lung pathology for a second opinion Start taking O FEV 150 mg twice a day We will repeat lung function testing before the end of the year We  will check a 6-minute walk test before the end of the year About 2 weeks after starting this new medicine we will check liver function testing to monitor for toxicity Taper off of prednisone: take every other day x 7 days then stop  Shortness of breath: I think most of this is due to deconditioning, I recommend he start exercising on a regular basis with walking and some resistance training as we discussed today.  If you have trouble with this please let me know and I can get you referred to a physical therapist  We will see you back in about 4 weeks  Greater than 50% of this 40-minute visit spent face-to-face    Current Outpatient Medications:  .  acetaminophen (TYLENOL) 500 MG tablet, Take 1,000 mg by mouth every 6 (six) hours as needed for mild pain or headache. , Disp: , Rfl:  .  albuterol (PROVENTIL HFA;VENTOLIN HFA) 108 (90 Base) MCG/ACT inhaler, Inhale 2 puffs into the lungs every 4 (four) hours as needed for wheezing or shortness  of breath. , Disp: , Rfl:  .  budesonide-formoterol (SYMBICORT) 80-4.5 MCG/ACT inhaler, Inhale 2 puffs into the lungs 2 (two) times daily., Disp: 1 Inhaler, Rfl: 0 .  CARTIA XT 240 MG 24 hr capsule, TAKE ONE CAPSULE BY MOUTH DAILY (Patient taking differently: Take 240 mg by mouth daily. ), Disp: 90 capsule, Rfl: 1 .  cetirizine (ZYRTEC) 10 MG tablet, Take 10 mg by mouth daily as needed for allergies. , Disp: , Rfl:  .  diphenoxylate-atropine (LOMOTIL) 2.5-0.025 MG tablet, Take 1 tablet by mouth 4 (four) times daily as needed for diarrhea or loose stools. , Disp: , Rfl:  .  ELIQUIS 5 MG TABS tablet, TAKE ONE TABLET (5MG  TOTAL) BY MOUTH TWODAILY, Disp: 180 tablet, Rfl: 1 .  ezetimibe (ZETIA) 10 MG tablet, TAKE ONE (1) TABLET BY MOUTH EVERY DAY, Disp: 90 tablet, Rfl: 3 .  fluticasone (FLONASE) 50 MCG/ACT nasal spray, Place 2 sprays into both nostrils daily as needed for allergies or rhinitis., Disp: , Rfl:  .  metFORMIN (GLUCOPHAGE) 500 MG tablet, Take 500 mg by  mouth at bedtime. , Disp: , Rfl:  .  Methylsulfonylmethane (MSM) 1000 MG CAPS, Take 1,000 mg by mouth daily. , Disp: , Rfl:  .  metoprolol tartrate (LOPRESSOR) 25 MG tablet, TAKE ONE TABLET BY MOUTH TWICE A DAY, Disp: 180 tablet, Rfl: 3 .  Neomycin-Bacitracin-Polymyxin (TRIPLE ANTIBIOTIC) [phone] OINT, Apply 1 application topically daily., Disp: , Rfl:  .  Omega-3 Fatty Acids (FISH OIL) 1000 MG CAPS, take 2 capsules daily (Patient taking differently: Take 500 mg by mouth 2 (two) times daily. ), Disp: , Rfl:  .  ondansetron (ZOFRAN) 4 MG tablet, Take 4 mg by mouth every 8 (eight) hours as needed for nausea or vomiting., Disp: , Rfl:  .  predniSONE (DELTASONE) 10 MG tablet, Take 1 tablet (10 mg total) by mouth daily with breakfast., Disp: 20 tablet, Rfl: 0 .  Spacer/Aero-Holding Chambers (AEROCHAMBER MV) inhaler, Use as instructed, Disp: 1 each, Rfl: 0 .  tamsulosin (FLOMAX) 0.4 MG CAPS capsule, Take 0.4 mg by mouth 2 (two) times daily. , Disp: , Rfl:

## 2018-03-12 NOTE — Patient Instructions (Signed)
Finding of emphysema on lung pathology Continue albuterol as you are doing Continue Symbicort as you are doing I will send the blood test called alpha-1 antitrypsin to look for a genetic condition which can lead to this  Idiopathic pulmonary fibrosis: I am going to send your pathology report to [facility] pathology for a second opinion Start taking O FEV 150 mg twice a day We will repeat lung function testing before the end of the year We will check a 6-minute walk test before the end of the year About 2 weeks after starting this new medicine we will check liver function testing to monitor for toxicity Taper off of prednisone: take every other day x 7 days then stop  Shortness of breath: I think most of this is due to deconditioning, I recommend he start exercising on a regular basis with walking and some resistance training as we discussed today.  If you have trouble with this please let me know and I can get you referred to a physical therapist  We will see you back in about 4 weeks

## 2018-03-14 ENCOUNTER — Other Ambulatory Visit (HOSPITAL_COMMUNITY)
Admission: RE | Admit: 2018-03-14 | Discharge: 2018-03-14 | Disposition: A | Discharge status: Home / Self Care | Payer: PPO | Source: Ambulatory Visit | Attending: Pulmonary Disease | Admitting: Pulmonary Disease

## 2018-03-14 DIAGNOSIS — J438 Other emphysema: Secondary | ICD-10-CM | POA: Diagnosis not present

## 2018-03-17 ENCOUNTER — Other Ambulatory Visit: Payer: Self-pay | Admitting: Thoracic Surgery (Cardiothoracic Vascular Surgery)

## 2018-03-17 DIAGNOSIS — J849 Interstitial pulmonary disease, unspecified: Secondary | ICD-10-CM

## 2018-03-17 LAB — ALPHA-1 ANTITRYPSIN PHENOTYPE: A-1 Antitrypsin, Ser: 125 mg/dL (ref 83–199)

## 2018-03-18 ENCOUNTER — Ambulatory Visit
Admission: RE | Admit: 2018-03-18 | Discharge: 2018-03-18 | Disposition: A | Discharge status: Home / Self Care | Payer: PPO | Source: Ambulatory Visit | Attending: Thoracic Surgery (Cardiothoracic Vascular Surgery) | Admitting: Thoracic Surgery (Cardiothoracic Vascular Surgery)

## 2018-03-18 ENCOUNTER — Ambulatory Visit (INDEPENDENT_AMBULATORY_CARE_PROVIDER_SITE_OTHER): Payer: PPO | Admitting: Thoracic Surgery (Cardiothoracic Vascular Surgery)

## 2018-03-18 VITALS — BP 119/76 | HR 76 | Resp 20 | Ht 69.0 in | Wt 188.0 lb

## 2018-03-18 DIAGNOSIS — J849 Interstitial pulmonary disease, unspecified: Secondary | ICD-10-CM

## 2018-03-18 DIAGNOSIS — R918 Other nonspecific abnormal finding of lung field: Secondary | ICD-10-CM | POA: Diagnosis not present

## 2018-03-18 DIAGNOSIS — Z09 Encounter for follow-up examination after completed treatment for conditions other than malignant neoplasm: Secondary | ICD-10-CM

## 2018-03-18 NOTE — Progress Notes (Signed)
[address]       [place] [address]             [phone]     HPI: [name] returns for scheduled postoperative follow-up visit  [name] is a 72 year old man with a past history of paroxysmal atrial fibrillation, viral myocarditis, polymyalgia rheumatica, gastroesophageal reflux, and hyperlipidemia.  He presented with progressive dyspnea.  He was found to have interstitial lung disease.  He was referred for biopsy.  I did a right VATS and lung biopsy on October 9.  He did well postoperatively and went home on day 2.  Pathology was consistent with UIP although it has been sent out for confirmation.  He is done well since his biopsy.  He does have some soreness in the right chest.  He has been taking Tylenol for that occasionally.  He is not using any narcotics.  He is anxious to resume full activities.  [name] did start him on OFEV for his IPF  Past Medical History:  Diagnosis Date  . Arthritis   . Chest pain    a. 03/2017: echo showing EF of 60-65%, no regional WMA or significant valve abnormalities. b. 03/2016: NST with no evidence of ischemia.   . Digestive problems    on Prednisone prn for this issue- diarrhea  . Dyspnea    with activity  . Dysrhythmia    irregular due to Myocarditis- takes Atenolol, Norvasc  . GERD (gastroesophageal reflux disease)   . H/O viral myocarditis    25 years ago  . Head injury, closed, with concussion    Breif LOC  . Hyperlipidemia   . Mild mitral valve prolapse    per Dr Evette Georges notes  . PAF (paroxysmal atrial fibrillation) ([place])    a. initially occuring in 02/2016. b. recurrent in 07/2016. Placed on Eliquis  . Polymyalgia rheumatica ([place])   . Pre-diabetes   . Sleep apnea    mild sleep apnea   no CPAP    Current Outpatient Medications  Medication Sig Dispense Refill  . acetaminophen (TYLENOL) 500 MG tablet Take 1,000 mg by mouth every 6 (six) hours as needed for mild pain or headache.     .  albuterol (PROVENTIL HFA;VENTOLIN HFA) 108 (90 Base) MCG/ACT inhaler Inhale 2 puffs into the lungs every 4 (four) hours as needed for wheezing or shortness of breath.     . budesonide-formoterol (SYMBICORT) 80-4.5 MCG/ACT inhaler Inhale 2 puffs into the lungs 2 (two) times daily. 1 Inhaler 0  . CARTIA XT 240 MG 24 hr capsule TAKE ONE CAPSULE BY MOUTH DAILY (Patient taking differently: Take 240 mg by mouth daily. ) 90 capsule 1  . cetirizine (ZYRTEC) 10 MG tablet Take 10 mg by mouth daily as needed for allergies.     . diphenoxylate-atropine (LOMOTIL) 2.5-0.025 MG tablet Take 1 tablet by mouth 4 (four) times daily as needed for diarrhea or loose stools.     Marland Kitchen ELIQUIS 5 MG TABS tablet TAKE ONE TABLET (5MG  TOTAL) BY MOUTH TWODAILY 180 tablet 1  . ezetimibe (ZETIA) 10 MG tablet TAKE ONE (1) TABLET BY MOUTH EVERY DAY 90 tablet 3  . fluticasone (FLONASE) 50 MCG/ACT nasal spray Place 2 sprays into both nostrils daily as needed for allergies or rhinitis.    . metFORMIN (GLUCOPHAGE) 500 MG tablet Take 500 mg by mouth at bedtime.     . Methylsulfonylmethane (MSM) 1000 MG CAPS Take 1,000 mg by mouth daily.     Marland Kitchen  metoprolol tartrate (LOPRESSOR) 25 MG tablet TAKE ONE TABLET BY MOUTH TWICE A DAY 180 tablet 3  . Neomycin-Bacitracin-Polymyxin (TRIPLE ANTIBIOTIC) [phone] OINT Apply 1 application topically daily.    Marland Kitchen OFEV 150 MG CAPS 150 mg 2 (two) times daily.     . Omega-3 Fatty Acids (FISH OIL) 1000 MG CAPS take 2 capsules daily (Patient taking differently: Take 500 mg by mouth 2 (two) times daily. )    . ondansetron (ZOFRAN) 4 MG tablet Take 4 mg by mouth every 8 (eight) hours as needed for nausea or vomiting.    Marland Kitchen Spacer/Aero-Holding Chambers (AEROCHAMBER MV) inhaler Use as instructed 1 each 0  . tamsulosin (FLOMAX) 0.4 MG CAPS capsule Take 0.4 mg by mouth 2 (two) times daily.     . predniSONE (DELTASONE) 10 MG tablet Take 1 tablet (10 mg total) by mouth daily with breakfast. (Patient not taking: Reported  on 03/18/2018) 20 tablet 0   No current facility-administered medications for this visit.     Physical Exam BP 119/76   Pulse 76   Resp 20   Ht 5\' 9"  (1.753 m)   Wt 188 lb (85.3 kg)   SpO2 94% Comment: RA  BMI 27.41 kg/m  72 year old man in no acute distress Alert and oriented x3 with no focal deficits Incision healing well Lungs clear with equal breath sounds bilaterally No peripheral edema  Diagnostic Tests: CHEST - 2 VIEW  COMPARISON:  PA and lateral chest 03/03/2018 and 02/21/2018.  FINDINGS: Postoperative change is again seen in the right chest with streaky opacities in the right lung base, slightly improved since the most recent comparison examination. The left lung is clear. There is no pneumothorax. Heart size is normal. No pleural effusion. Aortic atherosclerosis noted. No acute or focal bony abnormality.  IMPRESSION: Postoperative change right chest. Streaky opacities in the right lung base appears slightly improved compared to the most recent study. No new abnormality.   Electronically Signed   By: Inge Rise M.D.   On: 03/18/2018 15:15 I personally reviewed the chest x-ray images and concur with the findings noted above  Impression: [name] is a 72 year old man who presented with progressive dyspnea and was found to have interstitial lung disease.  He underwent a right VATS lung biopsy on 02/19/2018.  Biopsies showed UIP.  He did well postoperatively and went home on day 2.  He continues to do well.  He does have some soreness but it is relatively mild.  He has been using Tylenol for that occasionally  There are no restrictions on his activities at this time.  He was cautioned to build into new activities gradually to avoid undue soreness.  May drive.  Plan: Follow-up with [name].  I will be happy to see Mr. Naugle back at anytime if I can be of any further assistance in his care  [name], MD [facility] [phone]

## 2018-03-19 ENCOUNTER — Telehealth: Payer: Self-pay | Admitting: Pulmonary Disease

## 2018-03-19 MED ORDER — BUDESONIDE-FORMOTEROL FUMARATE 80-4.5 MCG/ACT IN AERO: 2.0000 | INHALATION_SPRAY | Freq: Two times a day (BID) | RESPIRATORY_TRACT | 12 refills | Status: DC

## 2018-03-19 NOTE — Telephone Encounter (Signed)
Called and spoke with patient advised him that we have not received anymore symbicort samples of 80 at this time. Prescription sent to pharmacy. Nothing further needed.

## 2018-03-27 DIAGNOSIS — M353 Polymyalgia rheumatica: Secondary | ICD-10-CM | POA: Diagnosis not present

## 2018-03-27 DIAGNOSIS — E1169 Type 2 diabetes mellitus with other specified complication: Secondary | ICD-10-CM | POA: Diagnosis not present

## 2018-03-27 DIAGNOSIS — I4891 Unspecified atrial fibrillation: Secondary | ICD-10-CM | POA: Diagnosis not present

## 2018-03-27 DIAGNOSIS — J8410 Pulmonary fibrosis, unspecified: Secondary | ICD-10-CM | POA: Diagnosis not present

## 2018-03-27 DIAGNOSIS — R197 Diarrhea, unspecified: Secondary | ICD-10-CM | POA: Diagnosis not present

## 2018-04-14 ENCOUNTER — Ambulatory Visit (INDEPENDENT_AMBULATORY_CARE_PROVIDER_SITE_OTHER): Payer: PPO | Admitting: Pulmonary Disease

## 2018-04-14 ENCOUNTER — Encounter: Payer: Self-pay | Admitting: Pulmonary Disease

## 2018-04-14 VITALS — BP 138/90 | HR 80 | Ht 69.69 in | Wt 182.8 lb

## 2018-04-14 DIAGNOSIS — M353 Polymyalgia rheumatica: Secondary | ICD-10-CM

## 2018-04-14 DIAGNOSIS — R197 Diarrhea, unspecified: Secondary | ICD-10-CM | POA: Diagnosis not present

## 2018-04-14 DIAGNOSIS — J45909 Unspecified asthma, uncomplicated: Secondary | ICD-10-CM

## 2018-04-14 DIAGNOSIS — J849 Interstitial pulmonary disease, unspecified: Secondary | ICD-10-CM | POA: Diagnosis not present

## 2018-04-14 LAB — COMPREHENSIVE METABOLIC PANEL
ALT: 57 U/L — ABNORMAL HIGH (ref 0–53)
AST: 86 U/L — ABNORMAL HIGH (ref 0–37)
Albumin: 3.8 g/dL (ref 3.5–5.2)
Alkaline Phosphatase: 130 U/L — ABNORMAL HIGH (ref 39–117)
BUN: 8 mg/dL (ref 6–23)
CO2: 29 mEq/L (ref 19–32)
Calcium: 8.4 mg/dL (ref 8.4–10.5)
Chloride: 102 mEq/L (ref 96–112)
Creatinine, Ser: 0.76 mg/dL (ref 0.40–1.50)
GFR: 107.57 mL/min (ref >60.00)
Glucose, Bld: 94 mg/dL (ref 70–99)
Potassium: 3.6 mEq/L (ref 3.5–5.1)
Sodium: 140 mEq/L (ref 135–145)
Total Bilirubin: 1.3 mg/dL — ABNORMAL HIGH (ref 0.2–1.2)
Total Protein: 6.2 g/dL (ref 6.0–8.3)

## 2018-04-14 LAB — CBC WITH DIFFERENTIAL/PLATELET
Basophils Absolute: 0 10*3/uL (ref 0.0–0.1)
Basophils Relative: 0.6 % (ref 0.0–3.0)
Eosinophils Absolute: 0.2 10*3/uL (ref 0.0–0.7)
Eosinophils Relative: 1.7 % (ref 0.0–5.0)
HCT: 47.1 % (ref 39.0–52.0)
Hemoglobin: 16.5 g/dL (ref 13.0–17.0)
Lymphocytes Relative: 18 % (ref 12.0–46.0)
Lymphs Abs: 1.5 10*3/uL (ref 0.7–4.0)
MCHC: 35.1 g/dL (ref 30.0–36.0)
MCV: 105.2 fl — ABNORMAL HIGH (ref 78.0–100.0)
Monocytes Absolute: 0.8 10*3/uL (ref 0.1–1.0)
Monocytes Relative: 9.4 % (ref 3.0–12.0)
Neutro Abs: 6 10*3/uL (ref 1.4–7.7)
Neutrophils Relative %: 70.3 % (ref 43.0–77.0)
Platelets: 202 10*3/uL (ref 150.0–400.0)
RBC: 4.47 Mil/uL (ref 4.22–5.81)
RDW: 14.4 % (ref 11.5–15.5)
WBC: 8.6 10*3/uL (ref 4.0–10.5)

## 2018-04-14 MED ORDER — NINTEDANIB ESYLATE 100 MG PO CAPS: 100.0000 mg | ORAL_CAPSULE | Freq: Two times a day (BID) | ORAL | 11 refills | Status: DC

## 2018-04-14 NOTE — Progress Notes (Signed)
Subjective:    [name], male    DOB: November 19, 1945, 72 y.o.   MRN: [id number]  Synopsis: Referred in November 2017 for evaluation of shortness of breath.  Found to have diffuse parenchymal lung disease of undetermined cause. An open lung biopsy performed on 02/19/2018 showed an early fibrotic process suggestive of UIP.  He only smoked cigarettes for about 3 to 6 months when he was a teenager.  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   HPI Chief Complaint  Patient presents with  . Follow-up    shortness of breathness, dry cough   An is struggling with his bowels: constipated or diarrhea;  Yesterday he had at least 15 bowel movements.  He hasn't changed his diet much.  He feels worn out from this.    He is coughing some first things in the mornings, a lot a night.  Still a dry cough, no mucus production. Sometimes it feels a little deeper.  He is not taking anything over the counter.   He is down to 79m of prednisone daily. His polymyalgia rhuematica is worse compared to prior.  He used to see Dr. TCharlestine Nightfor this.  He hasn't been back to a rheumatologist since.    Past Medical History:  Diagnosis Date  . Arthritis   . Chest pain    a. 03/2017: echo showing EF of 60-65%, no regional WMA or significant valve abnormalities. b. 03/2016: NST with no evidence of ischemia.   . Digestive problems    on Prednisone prn for this issue- diarrhea  . Dyspnea    with activity  . Dysrhythmia    irregular due to Myocarditis- takes Atenolol, Norvasc  . GERD (gastroesophageal reflux disease)   . H/O viral myocarditis    25 years ago  . Head injury, closed, with concussion    Breif LOC  . Hyperlipidemia   . Mild mitral valve prolapse    per Dr KEvette Georgesnotes  . PAF (paroxysmal atrial fibrillation) ([place]    a. initially occuring in 02/2016. b. recurrent in 07/2016. Placed on Eliquis  .  Polymyalgia rheumatica [place]   . Pre-diabetes   . Sleep apnea    mild sleep apnea   no CPAP     Review of Systems  Constitutional: Positive for fatigue. Negative for chills, diaphoresis and fever.  HENT: Negative for rhinorrhea, sinus pressure and sinus pain.   Respiratory: Positive for shortness of breath. Negative for cough and wheezing.   Cardiovascular: Negative for chest pain and leg swelling.       Objective:   Physical Exam  Vitals:   04/14/18 1110 04/14/18 1113  BP: 138/90 138/90  Pulse: 86 80  SpO2: 95% 95%  Weight: 182 lb 12.8 oz (82.9 kg) 182 lb 12.8 oz (82.9 kg)  Height: 5' 9.69" (1.77 m) 5' 9.69" (1.77 m)  RA   Gen: well appearing HENT: OP clear, TM's clear, neck supple PULM: Few crackles bases B, normal percussion CV: RRR, no mgr, trace edema GI: BS+, soft, nontender Derm: no cyanosis or rash Psyche: normal mood and affect      Imaging: November 2017 high-resolution CT scan of the chest images personally reviewed showing patchy groundglass bilaterally which appears predominantly to be patchy lower lobe atelectasis  Barium swallow from December 2017 no evidence of esophageal disorder. January 2018 high-resolution CT scan of the chest showed mild basilar predominant  subpleural reticulation and groundglass suggesting nonspecific interstitial pneumonitis. (images independently reviewed 04/14/2018) July 2019 HRCT> images independently reviewed showing fibrotic changes in the bases of the lungs in a nonspecific pattern, not consistent with UIP.  Records from her visit earlier this month with our nurse practitioner reviewed where he was seen for shortness of breath.  Prednisone and a rescue inhaler were prescribed  Blood work: November 2017 reviewed: CRP 139, total CK normal, antineutrophil cytoplasmic antibody testing negative  October 2019 alpha-1 antitrypsin M-M  Path: October 2019 open lung biopsy showed early fibrosis, the fibrosis has temporal  heterogeneity there are fibroblastic foci and lymphoid aggregates, no honeycombing identified.  Pathology feels suggestive of early UIP but not definitive; second opinion sent to [facility], Dr. Reggie Pile: Chronic fibrosing interstitial pneumonia unclassifiable pattern.  Temporal and spatially variegated pattern of fibrosing interstitial pneumonia with abundant spared alveolar parenchyma.  Prominent fibroblast foci.  No honeycombing.  Records of imaging was reviewed as well as the patient's history, they did query the possibility of an association with an underlying systemic inflammatory disorder or connective tissue disease.  No findings of aspiration.  Pulmonary function testing 04/2016 Ratio 83%, FEV1 3.02 L 93%, FVC 3.06 L , total lung capacity 5. 12/17/1982 percent protected, DLCO 23.9 75% predicted  August 2019 pulmonary function testing ratio normal, FVC 3.53 L 83% predicted, total lung capacity 6.25 L 106% predicted, residual volume 155% predicted, DLCO 21.2 mL 68% predicted    Assessment & Plan:   ILD (interstitial lung disease) ([place]) - Plan: Ambulatory referral to Rheumatology, Comp Met (CMET), CBC with Differential/Platelet  PMR (polymyalgia rheumatica) ([place]) - Plan: Ambulatory referral to Rheumatology  Uncomplicated asthma, unspecified asthma severity, unspecified whether persistent  Diarrhea, unspecified type  Discussion: He is struggling right now because of side effects of both as well as worsening polymyalgia.  The the biopsy of his lungs was reviewed by the [facility] pulmonary pathology team and they agree that this is an unclassifiable form of fibrosing pneumonia.  He did not have UIP, they did question whether or not an underlying connective tissue disease could be related.  I think the ideal situation for him would be to continue on Ofev he has a fibrotic lung disease and this will help prevent worsening.  In addition to that I think he needs to be taking a stronger autoimmune medicine  because I think there is reasonable concern that his diffuse parenchymal lung disease could be due to an autoimmune process.  I would like to pick something that also treats his polymyalgia, so I am going to refer him back to rheumatology to assess.  Plan: Diarrhea due to nintedanib: Hold nintedanib until you get the lower dose If diarrhea recurs when you are taking the lower dose of nintedanib then start the brat diet  Diffuse parenchymal lung disease: Fibrosing type, early UIP with autoimmune features Ideally I think you should be taking nintedanib as well as an anti-inflammatory medicine like CellCept or azathioprine. Because of your underlying polymyalgia rheumatica which is been poorly controlled I am going to refer you to rheumatology so that they can let us know which of these 2 medicines would be effective at controlling the polymyalgia Decrease nintedanib to 100 mg twice a day Check liver function testing and a complete metabolic panel today to make sure there is no toxicity from nintedanib For now continue prednisone 5 mg daily We will plan on repeating lung function testing and a 6-minute walk in 2020  Cough, asthma: Continue Symbicort  2 puffs twice a day  Follow-up in 6 to 8 weeks or sooner if need      Current Outpatient Medications:  .  acetaminophen (TYLENOL) 500 MG tablet, Take 1,000 mg by mouth every 6 (six) hours as needed for mild pain or headache. , Disp: , Rfl:  .  albuterol (PROVENTIL HFA;VENTOLIN HFA) 108 (90 Base) MCG/ACT inhaler, Inhale 2 puffs into the lungs every 4 (four) hours as needed for wheezing or shortness of breath. , Disp: , Rfl:  .  budesonide-formoterol (SYMBICORT) 80-4.5 MCG/ACT inhaler, Inhale 2 puffs into the lungs 2 (two) times daily., Disp: 1 Inhaler, Rfl: 0 .  budesonide-formoterol (SYMBICORT) 80-4.5 MCG/ACT inhaler, Inhale 2 puffs into the lungs 2 (two) times daily., Disp: 1 Inhaler, Rfl: 12 .  CARTIA XT 240 MG 24 hr capsule, TAKE ONE CAPSULE  BY MOUTH DAILY (Patient taking differently: Take 240 mg by mouth daily. ), Disp: 90 capsule, Rfl: 1 .  cetirizine (ZYRTEC) 10 MG tablet, Take 10 mg by mouth daily as needed for allergies. , Disp: , Rfl:  .  diphenoxylate-atropine (LOMOTIL) 2.5-0.025 MG tablet, Take 1 tablet by mouth 4 (four) times daily as needed for diarrhea or loose stools. , Disp: , Rfl:  .  ELIQUIS 5 MG TABS tablet, TAKE ONE TABLET (5MG TOTAL) BY MOUTH TWODAILY, Disp: 180 tablet, Rfl: 1 .  ezetimibe (ZETIA) 10 MG tablet, TAKE ONE (1) TABLET BY MOUTH EVERY DAY, Disp: 90 tablet, Rfl: 3 .  fluticasone (FLONASE) 50 MCG/ACT nasal spray, Place 2 sprays into both nostrils daily as needed for allergies or rhinitis., Disp: , Rfl:  .  Methylsulfonylmethane (MSM) 1000 MG CAPS, Take 1,000 mg by mouth daily. , Disp: , Rfl:  .  metoprolol tartrate (LOPRESSOR) 25 MG tablet, TAKE ONE TABLET BY MOUTH TWICE A DAY, Disp: 180 tablet, Rfl: 3 .  Neomycin-Bacitracin-Polymyxin (TRIPLE ANTIBIOTIC) [phone] OINT, Apply 1 application topically daily., Disp: , Rfl:  .  Omega-3 Fatty Acids (FISH OIL) 1000 MG CAPS, take 2 capsules daily (Patient taking differently: Take 500 mg by mouth 2 (two) times daily. ), Disp: , Rfl:  .  ondansetron (ZOFRAN) 4 MG tablet, Take 4 mg by mouth every 8 (eight) hours as needed for nausea or vomiting., Disp: , Rfl:  .  predniSONE (DELTASONE) 10 MG tablet, Take 1 tablet (10 mg total) by mouth daily with breakfast. (Patient taking differently: Take 5 mg by mouth daily with breakfast. ), Disp: 20 tablet, Rfl: 0 .  Spacer/Aero-Holding Chambers (AEROCHAMBER MV) inhaler, Use as instructed, Disp: 1 each, Rfl: 0 .  tamsulosin (FLOMAX) 0.4 MG CAPS capsule, Take 0.4 mg by mouth 2 (two) times daily. , Disp: , Rfl:  .  Nintedanib (OFEV) 100 MG CAPS, Take 1 capsule (100 mg total) by mouth 2 (two) times daily., Disp: 60 capsule, Rfl: 11

## 2018-04-14 NOTE — Patient Instructions (Signed)
Diarrhea due to nintedanib: Hold nintedanib until you get the lower dose If diarrhea recurs when you are taking the lower dose of nintedanib then start the brat diet  Diffuse parenchymal lung disease: Fibrosing type, early UIP with autoimmune features Ideally I think you should be taking nintedanib as well as an anti-inflammatory medicine like CellCept or azathioprine. Because of your underlying polymyalgia rheumatica which is been poorly controlled I am going to refer you to rheumatology so that they can let us know which of these 2 medicines would be effective at controlling the polymyalgia Decrease nintedanib to 100 mg twice a day Check liver function testing and a complete metabolic panel today to make sure there is no toxicity from nintedanib For now continue prednisone 5 mg daily We will plan on repeating lung function testing and a 6-minute walk in 2020  Cough, asthma: Continue Symbicort 2 puffs twice a day  Follow-up in 6 to 8 weeks or sooner if need

## 2018-04-14 NOTE — Addendum Note (Signed)
Addended by: Raliegh Ip on: 04/14/2018 12:29 PM   Modules accepted: Orders

## 2018-04-15 ENCOUNTER — Other Ambulatory Visit: Payer: Self-pay | Admitting: Pulmonary Disease

## 2018-04-15 DIAGNOSIS — Z5181 Encounter for therapeutic drug level monitoring: Secondary | ICD-10-CM

## 2018-04-16 ENCOUNTER — Encounter (HOSPITAL_COMMUNITY): Payer: Self-pay | Admitting: Thoracic Surgery (Cardiothoracic Vascular Surgery)

## 2018-04-18 DIAGNOSIS — I10 Essential (primary) hypertension: Secondary | ICD-10-CM | POA: Diagnosis not present

## 2018-04-18 DIAGNOSIS — N400 Enlarged prostate without lower urinary tract symptoms: Secondary | ICD-10-CM | POA: Diagnosis not present

## 2018-04-18 DIAGNOSIS — E782 Mixed hyperlipidemia: Secondary | ICD-10-CM | POA: Diagnosis not present

## 2018-04-18 DIAGNOSIS — E1165 Type 2 diabetes mellitus with hyperglycemia: Secondary | ICD-10-CM | POA: Diagnosis not present

## 2018-04-18 DIAGNOSIS — I4891 Unspecified atrial fibrillation: Secondary | ICD-10-CM | POA: Diagnosis not present

## 2018-04-18 DIAGNOSIS — E1169 Type 2 diabetes mellitus with other specified complication: Secondary | ICD-10-CM | POA: Diagnosis not present

## 2018-04-18 DIAGNOSIS — J8410 Pulmonary fibrosis, unspecified: Secondary | ICD-10-CM | POA: Diagnosis not present

## 2018-04-18 DIAGNOSIS — R7301 Impaired fasting glucose: Secondary | ICD-10-CM | POA: Diagnosis not present

## 2018-04-18 DIAGNOSIS — N401 Enlarged prostate with lower urinary tract symptoms: Secondary | ICD-10-CM | POA: Diagnosis not present

## 2018-04-22 ENCOUNTER — Other Ambulatory Visit (INDEPENDENT_AMBULATORY_CARE_PROVIDER_SITE_OTHER): Payer: PPO

## 2018-04-22 DIAGNOSIS — Z5181 Encounter for therapeutic drug level monitoring: Secondary | ICD-10-CM | POA: Diagnosis not present

## 2018-04-22 LAB — HEPATIC FUNCTION PANEL
ALT: 17 U/L (ref 0–53)
AST: 21 U/L (ref 0–37)
Albumin: 4 g/dL (ref 3.5–5.2)
Alkaline Phosphatase: 78 U/L (ref 39–117)
Bilirubin, Direct: 0.1 mg/dL (ref 0.0–0.3)
Total Bilirubin: 1 mg/dL (ref 0.2–1.2)
Total Protein: 6.5 g/dL (ref 6.0–8.3)

## 2018-04-24 DIAGNOSIS — E782 Mixed hyperlipidemia: Secondary | ICD-10-CM | POA: Diagnosis not present

## 2018-04-24 DIAGNOSIS — I10 Essential (primary) hypertension: Secondary | ICD-10-CM | POA: Diagnosis not present

## 2018-04-24 DIAGNOSIS — N401 Enlarged prostate with lower urinary tract symptoms: Secondary | ICD-10-CM | POA: Diagnosis not present

## 2018-04-24 DIAGNOSIS — Z8739 Personal history of other diseases of the musculoskeletal system and connective tissue: Secondary | ICD-10-CM | POA: Diagnosis not present

## 2018-04-24 DIAGNOSIS — R197 Diarrhea, unspecified: Secondary | ICD-10-CM | POA: Diagnosis not present

## 2018-04-24 DIAGNOSIS — E119 Type 2 diabetes mellitus without complications: Secondary | ICD-10-CM | POA: Diagnosis not present

## 2018-04-24 DIAGNOSIS — T7840XA Allergy, unspecified, initial encounter: Secondary | ICD-10-CM | POA: Diagnosis not present

## 2018-04-24 DIAGNOSIS — M25519 Pain in unspecified shoulder: Secondary | ICD-10-CM | POA: Diagnosis not present

## 2018-04-24 DIAGNOSIS — Z9181 History of falling: Secondary | ICD-10-CM | POA: Diagnosis not present

## 2018-04-24 DIAGNOSIS — M353 Polymyalgia rheumatica: Secondary | ICD-10-CM | POA: Diagnosis not present

## 2018-04-24 DIAGNOSIS — J849 Interstitial pulmonary disease, unspecified: Secondary | ICD-10-CM | POA: Diagnosis not present

## 2018-04-24 DIAGNOSIS — I482 Chronic atrial fibrillation, unspecified: Secondary | ICD-10-CM | POA: Diagnosis not present

## 2018-04-25 DIAGNOSIS — E663 Overweight: Secondary | ICD-10-CM | POA: Diagnosis not present

## 2018-04-25 DIAGNOSIS — J84112 Idiopathic pulmonary fibrosis: Secondary | ICD-10-CM | POA: Diagnosis not present

## 2018-04-25 DIAGNOSIS — M25511 Pain in right shoulder: Secondary | ICD-10-CM | POA: Diagnosis not present

## 2018-04-25 DIAGNOSIS — Z8739 Personal history of other diseases of the musculoskeletal system and connective tissue: Secondary | ICD-10-CM | POA: Diagnosis not present

## 2018-04-25 DIAGNOSIS — M542 Cervicalgia: Secondary | ICD-10-CM | POA: Diagnosis not present

## 2018-04-25 DIAGNOSIS — Z6827 Body mass index (BMI) 27.0-27.9, adult: Secondary | ICD-10-CM | POA: Diagnosis not present

## 2018-05-02 ENCOUNTER — Ambulatory Visit (INDEPENDENT_AMBULATORY_CARE_PROVIDER_SITE_OTHER): Payer: PPO | Admitting: Cardiovascular Disease

## 2018-05-02 ENCOUNTER — Encounter: Payer: Self-pay | Admitting: Cardiovascular Disease

## 2018-05-02 VITALS — BP 117/73 | HR 80 | Ht 69.0 in | Wt 188.0 lb

## 2018-05-02 DIAGNOSIS — I700 Atherosclerosis of aorta: Secondary | ICD-10-CM

## 2018-05-02 DIAGNOSIS — Z8679 Personal history of other diseases of the circulatory system: Secondary | ICD-10-CM | POA: Diagnosis not present

## 2018-05-02 DIAGNOSIS — M353 Polymyalgia rheumatica: Secondary | ICD-10-CM | POA: Diagnosis not present

## 2018-05-02 DIAGNOSIS — J849 Interstitial pulmonary disease, unspecified: Secondary | ICD-10-CM | POA: Diagnosis not present

## 2018-05-02 DIAGNOSIS — I480 Paroxysmal atrial fibrillation: Secondary | ICD-10-CM | POA: Diagnosis not present

## 2018-05-02 DIAGNOSIS — I10 Essential (primary) hypertension: Secondary | ICD-10-CM

## 2018-05-02 DIAGNOSIS — Z7901 Long term (current) use of anticoagulants: Secondary | ICD-10-CM | POA: Diagnosis not present

## 2018-05-02 DIAGNOSIS — E785 Hyperlipidemia, unspecified: Secondary | ICD-10-CM

## 2018-05-02 NOTE — Patient Instructions (Signed)
Medication Instructions:  The current medical regimen is effective;  continue present plan and medications.  If you need a refill on your cardiac medications before your next appointment, please call your pharmacy.   Follow-Up: At [facility], you and your health needs are our priority.  As part of our continuing mission to provide you with exceptional heart care, we have created designated Provider Care Teams.  These Care Teams include your primary Cardiologist (physician) and Advanced Practice Providers (APPs -  Physician Assistants and Nurse Practitioners) who all work together to provide you with the care you need, when you need it. You will need a follow up appointment in 12 months.  Please call our office 2 months in advance to schedule this appointment.  You may see Thomas Kelly, MD or one of the following Advanced Practice Providers on your designated Care Team: Hao Meng, PA-C . Angela [facility], PA-C       

## 2018-05-02 NOTE — Progress Notes (Signed)
[name], male   DOB: 25-Apr-1946, 72 y.o.   MRN: [id number]    Primary M.D.: Dr. Wende Neighbors  HPI: [name] is a 72 y.o. male who presents for a 19  month cardiology follow-up evaluation.   [name] has remote history of viral myocarditis in 1989 at which time his ejection fraction was 25%. He subsequently normalized LV function. He has a history of mild mitral valve prolapse. Cardiac catheterization in 2001 showed mild mid systolic LAD bridging. He has been noted to have mild T-wave changes on his ECG. Additional problems include hypertension as well as hyperlipidemia. He was unable to tolerate Lipitor. He initially tolerated Crestor but then developed significant myalgias.  He has been able to tolerate Zetia 10 mg daily.   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  Apparently, this has stabilized and is tired.  Ears have almost normalized.  He has been starting to resume very small amounts of red meat.    Past year, he has remained active.  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  He denies any episodes of chest pain, or change in exercise tolerance.  On 02/01/2015.  He underwent lumbar surgery by [name] involving L2-L3, L3-L4, and L4-L5.  He tolerated surgery without cardiovascular compromise.  When I last saw him in January 2017 he was on amlodipine 2.5 mg and atenolol 25 g twice a day, which has been helpful for his blood pressure, palpitations, as well as documented systolic LAD muscle bridging.    Over the past month, he has a definite change him to hematology and has developed progressive shortness of breath with activity.  He denies any significant chest pain.  He has seen [name], [facility] and when he was last seen by him one week ago I  went over his symptom complex and recommended a workup strategy.  He underwent an echo Doppler study which was done on 03/22/2016 and this showed normal systolic  function with an EF of 60-65% with grade 1 diastolic dysfunction.  His aorta was upper normal to mildly dilated at 38 mm.  There was no significant valvular pathology.  There was no TR Doppler jets an estimated PA systolic pressure was not able to be obtained.  Due to his shortness of breath.  He also underwent a CT of his chest.  He had dependent linear and platelike atelectasis in lower lungs bilaterally.  The patient has a remote exposure to asbestosis in his 27s, but his CT scan did not reveal any evidence for pleural based calcification.  There were diffuse atelectatic changes in the lungs bilaterally.  He also underwent a nuclear perfusion study which showed normal perfusion and function without scar or ischemia.  I had sent off laboratory was notable for a normal BMP 53.  Troponins were negative.  He had been prescribed levaquin by his primary physician as empiric therapy for a mild fever.  His fever resolved but he continues to experience shortness of breath.  His white blood count 13 days ago was 11.2.  He was macrocytic with an MCV of 102 with a normal hemoglobin at 16.2, hematocrit of 46.0.  B12 and folate levels were normal.  An erythrocyte sedimentation rate was increased at 61 and a high-sensitivity C-reactive protein was significantly increased at 139.    I referred him to [name] for pulmonary evaluation for possible interstitial lung disease  and also [name] for rheumatologic follow-up.  He was given increased steroids.  In November 2017.  A high resolution CT scan of the chest showed patchy groundglass bilaterally andpatchy lower lobe atelectasis.  A barium swallow did not show evidence for esophageal disorder.  Antineutrophil cytoplasmic antibody testing was negative.  A follow-up high-resolution CT was done on June 12, 2016, which showed mild basilar predominant subpleural reticulation and groundglass suggesting nonspecific interstitial pneumonitis, however, will interstitial  pneumonitis could not be excluded.  He was also noted to have aortic atherosclerosis and a punctate stone in his left kidney.  Since I last saw him, he continues to feel improved.  Subsequent laboratory had shown a sedimentation rate which improved from 61 to  1 and CRP which had improved from 139 to 6.8.  His lipids were elevated with a total cholesterol 222, triglycerides 359, HDL 67, VLDL 72, and LDL 83.  He developed myalgias with Crestor and Lipitor.  He denies chest pain. He is concerned about the cost of soe ofhis medicatios, partiyby.  He continues to be on pradaxa for anticoagulation and long-acting Cardizem.  He is unaware of any recent arrhythmia.  He had follow-up blood work on 01/15/2017 by Wende Neighbors in [place].  Hemoglobin 17.5, hematocrit 48.7.  Lipid studies were improved with a total cholesterol 220, triglycerides 175, HDL 72, LDL 113.  Hemoglobin A1c was 5.7.  He is on CPAP therapy.  He nasal pillows, small size, the D[employer] [facility].  Since I last saw him in September 2018 he has undergone more extensive pulmonary evaluation for his diffuse parenchymal lung disease of undetermined cause.  The February 19, 2018 he underwent an open lung biopsy by Dr. Roxan Hockey and there was some suggestion of possible early fibrotic process suggestive of UIP.  Subsequently, his biopsy was evaluated at [facility] by Dr. Reggie Pile.  He was felt to have chronic fibrosing interstitial pneumonia with an unclassifiable pattern.  They did query the possibility of association with an underlying systemic inflammatory or connective tissue disorder.  They did not believe he had UIP. He has also undergone follow-up rheumatologic evaluation.  He was started on a trial of Ofev.  From a cardiac standpoint, he denies any chest pain or palpitations.  He denies PND orthopnea.  He denies presyncope or syncope.  He is not short of breath at rest but does get short of breath with mild activity.  Past Medical History:    Diagnosis Date  . Arthritis   . Chest pain    a. 03/2017: echo showing EF of 60-65%, no regional WMA or significant valve abnormalities. b. 03/2016: NST with no evidence of ischemia.   . Digestive problems    on Prednisone prn for this issue- diarrhea  . Dyspnea    with activity  . Dysrhythmia    irregular due to Myocarditis- takes Atenolol, Norvasc  . GERD (gastroesophageal reflux disease)   . H/O viral myocarditis    25 years ago  . Head injury, closed, with concussion    Breif LOC  . Hyperlipidemia   . Mild mitral valve prolapse    per Dr Evette Georges notes  . PAF (paroxysmal atrial fibrillation) ([place])    a. initially occuring in 02/2016. b. recurrent in 07/2016. Placed on Eliquis  . Polymyalgia rheumatica ([place])   . Pre-diabetes   . Sleep apnea    mild sleep apnea   no CPAP    Past Surgical History:  Procedure Laterality Date  . apendectomy  1965  . APPENDECTOMY    . bone spur Bilateral 1999   feet  . CARDIAC CATHETERIZATION  2001  . CHOLECYSTECTOMY  2004  . COLONOSCOPY    . EYE SURGERY Bilateral    cataract removal  . LUMBAR LAMINECTOMY/ DECOMPRESSION WITH MET-RX Right 05/05/2013   Procedure: Right Lumbar three-four Extraforaminal Microdiskectomy with Metrex;  Surgeon: [name], MD;  Location: [place] [facility];  Service: Neurosurgery;  Laterality: Right;  Right Lumbar three-four Extraforaminal Microdiskectomy with Metrex  . LUNG BIOPSY Right 02/19/2018   Procedure: LUNG BIOPSY;  Surgeon: [name], MD;  Location: [facility];  Service: Thoracic;  Laterality: Right;  . SHOULDER OPEN ROTATOR CUFF REPAIR Bilateral 2001  . TONSILLECTOMY    . VIDEO ASSISTED THORACOSCOPY Right 02/19/2018   Procedure: VIDEO ASSISTED THORACOSCOPY;  Surgeon: [name], MD;  Location: [facility];  Service: Thoracic;  Laterality: Right;    Allergies  Allergen Reactions  . Lidocaine Hcl Anaphylaxis and Other (See Comments)    Xylocaine  . Xylocaine [Lidocaine] Anaphylaxis  .  Codeine Nausea And Vomiting  . Crestor [Rosuvastatin Calcium] Other (See Comments)    All-over body aches Myalgias   . Statins Other (See Comments)    Muscle soreness and an aching feeling all over Myalgias  . Percocet [Oxycodone-Acetaminophen] Itching    Current Outpatient Medications  Medication Sig Dispense Refill  . acetaminophen (TYLENOL) 500 MG tablet Take 1,000 mg by mouth every 6 (six) hours as needed for mild pain or headache.     . albuterol (PROVENTIL HFA;VENTOLIN HFA) 108 (90 Base) MCG/ACT inhaler Inhale 2 puffs into the lungs every 4 (four) hours as needed for wheezing or shortness of breath.     . budesonide-formoterol (SYMBICORT) 80-4.5 MCG/ACT inhaler Inhale 2 puffs into the lungs 2 (two) times daily. 1 Inhaler 0  . budesonide-formoterol (SYMBICORT) 80-4.5 MCG/ACT inhaler Inhale 2 puffs into the lungs 2 (two) times daily. 1 Inhaler 12  . CARTIA XT 240 MG 24 hr capsule TAKE ONE CAPSULE BY MOUTH DAILY (Patient taking differently: Take 240 mg by mouth daily. ) 90 capsule 1  . cetirizine (ZYRTEC) 10 MG tablet Take 10 mg by mouth daily as needed for allergies.     . diphenoxylate-atropine (LOMOTIL) 2.5-0.025 MG tablet Take 1 tablet by mouth 4 (four) times daily as needed for diarrhea or loose stools.     Marland Kitchen ELIQUIS 5 MG TABS tablet TAKE ONE TABLET (5MG TOTAL) BY MOUTH TWODAILY 180 tablet 1  . ezetimibe (ZETIA) 10 MG tablet TAKE ONE (1) TABLET BY MOUTH EVERY DAY 90 tablet 3  . fluticasone (FLONASE) 50 MCG/ACT nasal spray Place 2 sprays into both nostrils daily as needed for allergies or rhinitis.    . Methylsulfonylmethane (MSM) 1000 MG CAPS Take 1,000 mg by mouth daily.     . metoprolol tartrate (LOPRESSOR) 25 MG tablet TAKE ONE TABLET BY MOUTH TWICE A DAY 180 tablet 3  . Neomycin-Bacitracin-Polymyxin (TRIPLE ANTIBIOTIC) [phone] OINT Apply 1 application topically daily.    . Nintedanib (OFEV) 100 MG CAPS Take 1 capsule (100 mg total) by mouth 2 (two) times daily. 60 capsule 11   . Omega-3 Fatty Acids (FISH OIL) 1000 MG CAPS take 2 capsules daily (Patient taking differently: Take 500 mg by mouth 2 (two) times daily. )    . ondansetron (ZOFRAN) 4 MG tablet Take 4 mg by mouth every 8 (eight) hours as needed for nausea or vomiting.    . predniSONE (DELTASONE) 5 MG tablet Take  5 mg by mouth daily with breakfast.    . Spacer/Aero-Holding Chambers (AEROCHAMBER MV) inhaler Use as instructed 1 each 0  . tamsulosin (FLOMAX) 0.4 MG CAPS capsule Take 0.4 mg by mouth 2 (two) times daily.      No current facility-administered medications for this visit.     Social History   Socioeconomic History  . Marital status: Married    Spouse name: Not on file  . Number of children: Not on file  . Years of education: Not on file  . Highest education level: Not on file  Occupational History  . Not on file  Social Needs  . Financial resource strain: Not on file  . Food insecurity:    Worry: Not on file    Inability: Not on file  . Transportation needs:    Medical: Not on file    Non-medical: Not on file  Tobacco Use  . Smoking status: Never Smoker  . Smokeless tobacco: Never Used  Substance and Sexual Activity  . Alcohol use: Yes    Alcohol/week: 21.0 standard drinks    Types: 21 Glasses of wine per week    Comment: 2/3 glasses wine in evening  . Drug use: No  . Sexual activity: Yes    Birth control/protection: Other-see comments    Comment: old age  Lifestyle  . Physical activity:    Days per week: Not on file    Minutes per session: Not on file  . Stress: Not on file  Relationships  . Social connections:    Talks on phone: Not on file    Gets together: Not on file    Attends religious service: Not on file    Active member of club or organization: Not on file    Attends meetings of clubs or organizations: Not on file    Relationship status: Not on file  . Intimate partner violence:    Fear of current or ex partner: Not on file    Emotionally abused: Not on file     Physically abused: Not on file    Forced sexual activity: Not on file  Other Topics Concern  . Not on file  Social History Narrative  . 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 He is married has 2 children. There is no tobacco use. He stays active. There is no alcohol use.  He is still working but had a significantly reduced pace than he had previously.  Family history is notable that both parents are deceased.  Mother died of old age.  Father died but had a history of mitral valve prolapse.  He has 2 brothers and one sister who are  alive and well.  ROS General: Negative; No fevers, chills, or night sweats;  HEENT: Negative; No changes in vision or hearing, sinus congestion, difficulty swallowing Pulmonary:  shortness of breath with activity; undergoing evaluation for chronic fibrosing interstitial lung disease Cardiovascular: See history of present illness GI: No recent diarrhea. GU: Negative; No dysuria, hematuria, or difficulty voiding Musculoskeletal: Negative; no myalgias, joint pain, or weakness Hematologic/Oncology: Negative; no easy bruising, bleeding Endocrine: Negative; no heat/cold intolerance; no diabetes Neuro: Negative; no changes in balance, headaches Skin: Negative; No rashes or skin lesions Psychiatric: Negative; No behavioral problems, depression Sleep: Negative; No snoring, daytime sleepiness, hypersomnolence, bruxism, restless legs, hypnogognic hallucinations, no cataplexy Other comprehensive 14 point system review is negative.  PE BP 117/73   Pulse 80   Ht 5' 9" (1.753 m)  Wt 188 lb (85.3 kg)   BMI 27.76 kg/m    Wt Readings from Last 3 Encounters:  05/02/18 188 lb (85.3 kg)  04/14/18 182 lb 12.8 oz (82.9 kg)  03/18/18 188 lb (85.3 kg)   General: Alert, oriented, no distress.  Skin: normal turgor, no rashes, warm and dry HEENT: Normocephalic, atraumatic. Pupils equal round and reactive to light; sclera anicteric; extraocular muscles intact;   Nose without nasal septal hypertrophy Mouth/Parynx benign; Mallinpatti scale 2 Neck: No JVD, no carotid bruits; normal carotid upstroke Lungs: No wheezing or rales.  No egophony Chest wall: without tenderness to palpitation Heart: PMI not displaced, RRR, s1 s2 normal, 1/6 systolic murmur, no diastolic murmur, no rubs, gallops, thrills, or heaves Abdomen: soft, nontender; no hepatosplenomehaly, BS+; abdominal aorta nontender and not dilated by palpation. Back: no CVA tenderness Pulses 2+ Musculoskeletal: full range of motion, normal strength, no joint deformities Extremities: no clubbing cyanosis or edema, Homan's sign negative  Neurologic: grossly nonfocal; Cranial nerves grossly wnl Psychologic: Normal mood and affect   ECG (independently read by me): Normal sinus rhythm at 80 bpm.  No ectopy.  Normal intervals.  September 2018 ECG (independently read by me): Normal sinus rhythm at 65 bpm.  Isolated PAC.  Incomplete right bundle branch block.  Normal intervals.  March 2018 ECG (independently read by me): Normal sinus rhythm at 79 bpm.  QTc interval 460 ms.  PR interval normal at 158 ms.  November 2017 ECG (independently read by me): Normal sinus rhythm at 99 bpm, incomplete right bundle branch block.  Nondiagnostic ST-T changes.  January 2017 ECG (independently read by me): Normal sinus rhythm at 82 bpm.  Mild RV conduction delay.  Normal intervals.  No significant ST-T changes.  December 2015 ECG (independently read by me): Normal sinus rhythm at 74 bpm.  Mild RV conduction delay.  QTc interval 461 ms.  December 2014 ECG: Sinus rhythm at 57 beats per minute. No ectopy. Normal intervals.  LABS:  BMP Latest Ref Rng & Units 04/14/2018 02/21/2018 02/20/2018  Glucose 70 - 99 mg/dL 94 118(H) 153(H)  BUN 6 - 23 mg/dL _0 Creatinine 0.40 - 1.50 mg/dL 0.76 0.80 0.66  Sodium 135 - 145 mEq/L 140 140 136  Potassium 3.5 - 5.1 mEq/L 3.6 3.6 4.4  Chloride 96 - 112 mEq/L 102 108 104  CO2  19 - 32 mEq/L _1 Calcium 8.4 - 10.5 mg/dL 8.4 8.3(L) 8.2(L)   Hepatic Function Latest Ref Rng & Units 04/22/2018 04/14/2018 02/21/2018  Total Protein 6.0 - 8.3 g/dL 6.5 6.2 4.9(L)  Albumin 3.5 - 5.2 g/dL 4.0 3.8 2.8(L)  AST 0 - 37 U/L 21 86(H) 51(H)  ALT 0 - 53 U/L 17 57(H) 101(H)  Alk Phosphatase 39 - 117 U/L 78 130(H) 60  Total Bilirubin 0.2 - 1.2 mg/dL 1.0 1.3(H) 1.3(H)  Bilirubin, Direct 0.0 - 0.3 mg/dL 0.1 - -   CBC Latest Ref Rng & Units 04/14/2018 02/21/2018 02/20/2018  WBC 4.0 - 10.5 K/uL 8.6 13.2(H) 15.0(H)  Hemoglobin 13.0 - 17.0 g/dL 16.5 14.2 15.0  Hematocrit 39.0 - 52.0 % 47.1 41.8 43.1  Platelets 150.0 - 400.0 K/uL 202.0 151 184   Lab Results  Component Value Date   MCV 105.2 (H) 04/14/2018   MCV 105.3 (H) 02/21/2018   MCV 104.4 (H) 02/20/2018   Erythrocyte Sedimentation Rate     Component Value Date/Time   ESRSEDRATE 13 01/14/2018 0950   CRP November 2017: 139  Repeat  CRP 07/16/16:  6  Lab Results  Component Value Date   TSH 2.31 07/16/2016     Lab Results  Component Value Date   HGBA1C 6.9 (H) 02/17/2018     Lipid Panel     Component Value Date/Time   CHOL 222 (H) 07/16/2016 0902   TRIG 359 (H) 07/16/2016 0902   HDL 67 07/16/2016 0902   CHOLHDL 3.3 07/16/2016 0902   VLDL 72 (H) 07/16/2016 0902   LDLCALC 83 07/16/2016 0902   ------------------------------------------------------------------------------------------------------------- Oxygen Saturation: Rest: 92% with pulse of 93  Following walking in the office: 93% with a pulse of 106  03/21/2016 Nuclear Study Highlights    The left ventricular ejection fraction is normal (55-65%).  Nuclear stress EF: 61%.  The study is normal.  This is a low risk study.  There was no ST segment deviation noted during stress.     ------------------------------------------------------------------- 03/22/2016 ECHO Study Conclusions  - Left ventricle: The cavity size was normal. Wall thickness  was   normal. Systolic function was normal. The estimated ejection   fraction was in the range of 60% to 65%. Wall motion was normal;   there were no regional wall motion abnormalities. Doppler   parameters are consistent with abnormal left ventricular   relaxation (grade 1 diastolic dysfunction). - Aortic valve: There was no stenosis. - Aorta: Mildly dilated aortic root. Aortic root dimension: 38 mm   (ED). - Mitral valve: There was no significant regurgitation. - Right ventricle: The cavity size was normal. Systolic function   was normal. - Pulmonary arteries: No complete TR doppler jet so unable to   estimate PA systolic pressure. - Inferior vena cava: The vessel was normal in size. The   respirophasic diameter changes were in the normal range (>= 50%),   consistent with normal central venous pressure.  Impressions:  - Normal LV size with EF 60-65%. Normal RV size and systolic   function. No significant valvular abnormalities.  --------------------------------------------------------------------------------------------  04/02/2016 EXAM: CT CHEST WITHOUT CONTRAST  TECHNIQUE: Multidetector CT imaging of the chest was performed following the standard protocol without IV contrast.  COMPARISON:  None.  FINDINGS: Cardiovascular: The heart size is normal. No pericardial effusion. Atherosclerotic calcification is noted in the wall of the thoracic aorta.  Mediastinum/Nodes: No mediastinal lymphadenopathy. No evidence for gross hilar lymphadenopathy although assessment is limited by the lack of intravenous contrast on today's study. There is no axillary lymphadenopathy.  Lungs/Pleura: Fine detail obscured by breathing motion. Dependent linear and platelike atelectasis noted in the lower lungs bilaterally. Components of associated parenchymal scarring not excluded. No evidence for pulmonary edema. No dense focal airspace consolidation. No evidence for pleural  effusion.  Upper Abdomen: The liver shows diffusely decreased attenuation suggesting steatosis. Gallbladder surgically absent.  Musculoskeletal: Degenerative changes noted in the shoulders bilaterally.  IMPRESSION: Relatively diffuse atelectatic changes in the lungs bilaterally. Study degraded by patient breathing motion.  No specific findings to explain the patient's history of shortness of breath.   06/13/2016 CT Chest High Resolution  FINDINGS: Cardiovascular: Atherosclerotic calcification of the arterial vasculature. Heart size normal. No pericardial effusion.  Mediastinum/Nodes: No pathologically enlarged mediastinal or axillary lymph nodes. Hilar regions are difficult to evaluate without IV contrast. Esophagus is grossly unremarkable.  Lungs/Pleura: Mild basilar predominant subpleural reticulation and ground-glass with minimal architectural distortion. No definite traction bronchiectasis/bronchiolectasis or honeycombing. Findings are likely unchanged from 04/02/2016 but there was a great deal of respiratory motion on that exam. There may be minimal air trapping. No  pleural fluid. Airway is unremarkable.  Upper Abdomen: Visualized portions of the liver, adrenal glands and right kidney are grossly unremarkable. Punctate stone in the left kidney. Visualized portions of the spleen, pancreas, stomach and bowel are grossly unremarkable. Cholecystectomy. No upper abdominal adenopathy.  Musculoskeletal: No worrisome lytic or sclerotic lesions. Degenerative changes are seen in the spine.  IMPRESSION: 1. Mild basilar predominant subpleural reticulation and ground-glass, suggesting nonspecific interstitial pneumonitis. Usual interstitial pneumonitis cannot be excluded on this baseline examination. 2.  Aortic atherosclerosis (ICD10-170.0). 3. Punctate stone left kidney.   IMPRESSION:  1. PAF (paroxysmal atrial fibrillation) ([place])   2. Essential hypertension    3. ILD (interstitial lung disease) ([place])   4. Hyperlipidemia, unspecified hyperlipidemia type   5. Chronic anticoagulation   6. History of viral myocarditis: 1989   7. Aortic atherosclerosis ([place])   8. Polymyalgia rheumatica ([facility])     ASSESSMENT AND PLAN: [name] is a 72 year old Caucasian male who has a remote history of viral myocarditis associated with an ejection fraction of 25% in 1989.  His echo Doppler study in October 2011 showed an ejection fraction greater than 55% with mild TR. He has a history of mitral valve prolapse involving the anterior mitral valve leaflet which was seen on prior echo Doppler studies.  His father also had mitral valve prolapse and is deceased.  Cardiac catheterization by me in 2001 showed mild mid systolic bridging.  He has had chronic T-wave abnormalities on his ECG.  He developed a significant change in symptoms in the fall of 2017 with  progressive increasing shortness of breath with activity.  He also had a low-grade fever and was empirically treated with Levaquin by his primary physician with resolution of the fever, but with continued dyspnea.  He admits to exposure to asbestos when he was in his 82s.  A CT of his chest showed basilar diffuse atelectasis.  There is no evidence for pleural based calcifications.  His last echo Doppler study continued to show normal systolic function and only grade 1 diastolic dysfunction, which did explain his significant dyspnea.  In addition, his nuclear perfusion study continues to show normal perfusion without scar or ischemia.  His BMP is normal and are use against a cardiac etiology to his dyspnea.  He had undergone extensive evaluation with [name] and had seen Dr. Charlestine Night before he retired.  When I last saw him in 2018 his symptoms had significantly improved and markers of inflammation had normalized.  Apparently he is required further extensive evaluation due to recurrent symptomatology.  I reviewed his  subsequent evaluations with [name], his open lung biopsy by Dr. Roxan Hockey, and subsequent assessments.  He currently is on a trial of Ofev.  He has polymyalgia rheumatica and has undergone a recent rheumatologic reevaluation.  His blood pressure today is stable and on repeat by me was 128/72.  He has not had any recurrent episodes of atrial fibrillation and is  maintaining sinus rhythm.  He continues to be on anticoagulation and is now on Eliquis in place of previous Pradaxa.  He is on Cartia XT 240 mg daily, metoprolol 25 mg twice a day.  He is tolerating Zetia 10 mg.  I reviewed recent lab work done on April 18, 2018 by Dr. Wende Neighbors.  Hemoglobin hematocrit were stable although his MCV was 101.  Renal function was normal with a BUN of 6 and creatinine 0.78.  Lipid studies revealed total cholesterol 208, triglycerides 87, LDL 92 and  his HDL was 89.  Hemoglobin A1c was 6.0.  He has been on chronic prednisone therapy currently at 5 mg.  He will be seeing [name] in follow-up in January.  In the future a follow-up echo Doppler study may be warranted.  As long as he remains stable cardiovascularly, I will see him in 6 to 12 months for follow-up evaluation.  Time spent: 25 minutes Troy Sine, MD, [facility]  05/04/2018 9:19 AM

## 2018-05-04 ENCOUNTER — Encounter: Payer: Self-pay | Admitting: Cardiovascular Disease

## 2018-05-16 DIAGNOSIS — J849 Interstitial pulmonary disease, unspecified: Secondary | ICD-10-CM | POA: Diagnosis not present

## 2018-05-16 DIAGNOSIS — M353 Polymyalgia rheumatica: Secondary | ICD-10-CM | POA: Diagnosis not present

## 2018-05-16 DIAGNOSIS — I482 Chronic atrial fibrillation, unspecified: Secondary | ICD-10-CM | POA: Diagnosis not present

## 2018-05-16 DIAGNOSIS — Z8739 Personal history of other diseases of the musculoskeletal system and connective tissue: Secondary | ICD-10-CM | POA: Diagnosis not present

## 2018-05-16 DIAGNOSIS — M25519 Pain in unspecified shoulder: Secondary | ICD-10-CM | POA: Diagnosis not present

## 2018-05-16 DIAGNOSIS — M545 Low back pain: Secondary | ICD-10-CM | POA: Diagnosis not present

## 2018-05-27 DIAGNOSIS — J84112 Idiopathic pulmonary fibrosis: Secondary | ICD-10-CM | POA: Diagnosis not present

## 2018-05-27 DIAGNOSIS — M542 Cervicalgia: Secondary | ICD-10-CM | POA: Diagnosis not present

## 2018-05-27 DIAGNOSIS — M25511 Pain in right shoulder: Secondary | ICD-10-CM | POA: Diagnosis not present

## 2018-05-27 DIAGNOSIS — M353 Polymyalgia rheumatica: Secondary | ICD-10-CM | POA: Diagnosis not present

## 2018-05-27 DIAGNOSIS — Z8739 Personal history of other diseases of the musculoskeletal system and connective tissue: Secondary | ICD-10-CM | POA: Diagnosis not present

## 2018-05-27 DIAGNOSIS — Z6827 Body mass index (BMI) 27.0-27.9, adult: Secondary | ICD-10-CM | POA: Diagnosis not present

## 2018-05-27 DIAGNOSIS — E663 Overweight: Secondary | ICD-10-CM | POA: Diagnosis not present

## 2018-05-29 DIAGNOSIS — M5416 Radiculopathy, lumbar region: Secondary | ICD-10-CM | POA: Diagnosis not present

## 2018-05-29 DIAGNOSIS — Z6827 Body mass index (BMI) 27.0-27.9, adult: Secondary | ICD-10-CM | POA: Diagnosis not present

## 2018-05-30 ENCOUNTER — Ambulatory Visit (INDEPENDENT_AMBULATORY_CARE_PROVIDER_SITE_OTHER): Payer: PPO | Admitting: Pulmonary Disease

## 2018-05-30 ENCOUNTER — Encounter: Payer: Self-pay | Admitting: Pulmonary Disease

## 2018-05-30 VITALS — BP 116/74 | HR 80 | Ht 69.0 in | Wt 185.6 lb

## 2018-05-30 DIAGNOSIS — J849 Interstitial pulmonary disease, unspecified: Secondary | ICD-10-CM | POA: Diagnosis not present

## 2018-05-30 DIAGNOSIS — Z5181 Encounter for therapeutic drug level monitoring: Secondary | ICD-10-CM

## 2018-05-30 DIAGNOSIS — M353 Polymyalgia rheumatica: Secondary | ICD-10-CM | POA: Diagnosis not present

## 2018-05-30 MED ORDER — SULFAMETHOXAZOLE-TRIMETHOPRIM 800-160 MG PO TABS: 1.0000 | ORAL_TABLET | Freq: Two times a day (BID) | ORAL | 5 refills | Status: DC

## 2018-05-30 MED ORDER — MYCOPHENOLATE MOFETIL 500 MG PO TABS: ORAL_TABLET | ORAL | 5 refills | Status: DC

## 2018-05-30 MED ORDER — PREDNISONE 10 MG PO TABS: 10.0000 mg | ORAL_TABLET | Freq: Every day | ORAL | 5 refills | Status: DC

## 2018-05-30 NOTE — Addendum Note (Signed)
Addended by: Len Blalock on: 05/30/2018 04:31 PM   Modules accepted: Orders

## 2018-05-30 NOTE — Patient Instructions (Signed)
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

## 2018-05-30 NOTE — Progress Notes (Signed)
Subjective:    [name], male    DOB: 1945/12/29, 73 y.o.   MRN: [id number]  Synopsis: Referred in November 2017 for evaluation of shortness of breath.  Found to have diffuse parenchymal lung disease of undetermined cause. An open lung biopsy performed on 02/19/2018 showed an early fibrotic process suggestive of UIP.  He only smoked cigarettes for about 3 to 6 months when he was a teenager.  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   HPI Chief Complaint  Patient presents with  . Follow-up    He feels like his breathing has improved some since the last visit. He is coughing less. He has not had to use his rescue inhaler. He has only been using the am dose of his symbicort.    [name] says he is feeling better.  He has not had much problems with cough or shortness of breath.  He has seen many different doctors recently including a rheumatologist.  He says that his blood work was negative for an underlying rheumatoid arthritis.  He says that his pain from surgery has improved.  Past Medical History:  Diagnosis Date  . Arthritis   . Chest pain    a. 03/2017: echo showing EF of 60-65%, no regional WMA or significant valve abnormalities. b. 03/2016: NST with no evidence of ischemia.   . Digestive problems    on Prednisone prn for this issue- diarrhea  . Dyspnea    with activity  . Dysrhythmia    irregular due to Myocarditis- takes Atenolol, Norvasc  . GERD (gastroesophageal reflux disease)   . H/O viral myocarditis    25 years ago  . Head injury, closed, with concussion    Breif LOC  . Hyperlipidemia   . Mild mitral valve prolapse    per Dr Evette Georges notes  . PAF (paroxysmal atrial fibrillation) ([place])    a. initially occuring in 02/2016. b. recurrent in 07/2016. Placed on Eliquis  . Polymyalgia rheumatica ([place])   . Pre-diabetes   . Sleep apnea    mild sleep apnea   no  CPAP     Review of Systems  Constitutional: Positive for fatigue. Negative for chills, diaphoresis and fever.  HENT: Negative for rhinorrhea, sinus pressure and sinus pain.   Respiratory: Positive for shortness of breath. Negative for cough and wheezing.   Cardiovascular: Negative for chest pain and leg swelling.       Objective:   Physical Exam  Vitals:   05/30/18 1539  BP: 116/74  Pulse: 80  SpO2: 95%  Weight: 185 lb 9.6 oz (84.2 kg)  Height: 5\' 9"  (1.753 m)  RA   Gen: well appearing HENT: OP clear,, neck supple PULM: Few crackles R base, clear left B, normal percussion CV: RRR, no mgr, trace edema GI: BS+, soft, nontender Derm: no cyanosis or rash Psyche: normal mood and affect    Imaging: November 2017 high-resolution CT scan of the chest images personally reviewed showing patchy groundglass bilaterally which appears predominantly to be patchy lower lobe atelectasis  Barium swallow from December 2017 no evidence of esophageal disorder. January 2018 high-resolution CT scan of the chest showed mild basilar predominant subpleural reticulation and groundglass suggesting nonspecific interstitial pneumonitis. (images independently reviewed 05/30/2018) July 2019 HRCT> images independently reviewed showing fibrotic changes in the bases of the lungs in a nonspecific pattern, not consistent with UIP.  Records from  her visit earlier this month with our nurse practitioner reviewed where he was seen for shortness of breath.  Prednisone and a rescue inhaler were prescribed  Blood work: November 2017 reviewed: CRP 139, total CK normal, antineutrophil cytoplasmic antibody testing negative  October 2019 alpha-1 antitrypsin M-M  Path: October 2019 open lung biopsy showed early fibrosis, the fibrosis has temporal heterogeneity there are fibroblastic foci and lymphoid aggregates, no honeycombing identified.  Pathology feels suggestive of early UIP but not definitive; second opinion  sent to [facility], Dr. Reggie Pile: Chronic fibrosing interstitial pneumonia unclassifiable pattern.  Temporal and spatially variegated pattern of fibrosing interstitial pneumonia with abundant spared alveolar parenchyma.  Prominent fibroblast foci.  No honeycombing.  Records of imaging was reviewed as well as the patient's history, they did query the possibility of an association with an underlying systemic inflammatory disorder or connective tissue disease.  No findings of aspiration.  Pulmonary function testing 04/2016 Ratio 83%, FEV1 3.02 L 93%, FVC 3.06 L , total lung capacity 5. 12/17/1982 percent protected, DLCO 23.9 75% predicted  August 2019 pulmonary function testing ratio normal, FVC 3.53 L 83% predicted, total lung capacity 6.25 L 106% predicted, residual volume 155% predicted, DLCO 21.2 mL 68% predicted  Records from his visit with rheumatology reviewed where he was seen for polymyalgia rheumatica with underlying UIP.  There is a very nice discussion in their notes regarding medicines which could be used for immunosuppressants.  They felt that CellCept or Imuran would be reasonable choices to help his underlying lung disease.  Fortunately lab work did not show evidence of underlying rheumatoid arthritis.    Assessment & Plan:   Therapeutic drug monitoring - Plan: Hepatic function panel  ILD (interstitial lung disease) (HCC)  PMR (polymyalgia rheumatica) (HCC)  Discussion: He is doing quite well, he struggled with elevated liver enzymes and diarrhea on the higher dose of nintedanib but that has fortunately resolved.  We will check liver function testing today.  He has diffuse parenchymal lung disease with a UIP pattern but autoimmune features and known underlying autoimmune disease.  Based on this he needs to be treated from 2 separate fronts: Hopefully a disease modifying front with CellCept and low-dose prednisone, as well as a disease controlling standpoint with nintedanib 100 mg twice a  day.  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 and polymyalgia rheumatica: Start CellCept 500 mg daily, then 500 mg twice a day for a week, then 1 g twice a day Start prednisone 10 mg daily indefinitely Start Bactrim double strength Monday Wednesday Friday: This is an antibiotic which is intended to reduce the likelihood of an infection while you take CellCept Continue nintedanib 100 mg twice a day We will check liver function testing today to make sure there is no evidence of toxicity from this medicine He will need blood work on the next visit to monitor for toxicity from these medicines  Follow-up in 6 to 8 weeks or sooner      Current Outpatient Medications:  .  acetaminophen (TYLENOL) 500 MG tablet, Take 1,000 mg by mouth every 6 (six) hours as needed for mild pain or headache. , Disp: , Rfl:  .  albuterol (PROVENTIL HFA;VENTOLIN HFA) 108 (90 Base) MCG/ACT inhaler, Inhale 2 puffs into the lungs every 4 (four) hours as needed for wheezing or shortness of breath. , Disp: , Rfl:  .  budesonide-formoterol (SYMBICORT) 80-4.5 MCG/ACT inhaler, Inhale  2 puffs into the lungs 2 (two) times daily., Disp: 1 Inhaler, Rfl: 12 .  CARTIA XT 240 MG 24 hr capsule, TAKE ONE CAPSULE BY MOUTH DAILY (Patient taking differently: Take 240 mg by mouth daily. ), Disp: 90 capsule, Rfl: 1 .  cetirizine (ZYRTEC) 10 MG tablet, Take 10 mg by mouth daily as needed for allergies. , Disp: , Rfl:  .  diphenoxylate-atropine (LOMOTIL) 2.5-0.025 MG tablet, Take 1 tablet by mouth 4 (four) times daily as needed for diarrhea or loose stools. , Disp: , Rfl:  .  ELIQUIS 5 MG TABS tablet, TAKE ONE TABLET (5MG  TOTAL) BY MOUTH TWODAILY, Disp: 180 tablet, Rfl: 1 .  ezetimibe (ZETIA) 10 MG tablet, TAKE ONE (1) TABLET BY MOUTH EVERY DAY, Disp: 90 tablet, Rfl: 3 .  fluticasone (FLONASE) 50 MCG/ACT nasal spray, Place 2 sprays into both  nostrils daily as needed for allergies or rhinitis., Disp: , Rfl:  .  Methylsulfonylmethane (MSM) 1000 MG CAPS, Take 1,000 mg by mouth daily. , Disp: , Rfl:  .  metoprolol tartrate (LOPRESSOR) 25 MG tablet, TAKE ONE TABLET BY MOUTH TWICE A DAY, Disp: 180 tablet, Rfl: 3 .  Neomycin-Bacitracin-Polymyxin (TRIPLE ANTIBIOTIC) 3.5-8380473735 OINT, Apply 1 application topically daily., Disp: , Rfl:  .  Nintedanib (OFEV) 100 MG CAPS, Take 1 capsule (100 mg total) by mouth 2 (two) times daily., Disp: 60 capsule, Rfl: 11 .  Omega-3 Fatty Acids (FISH OIL) 1000 MG CAPS, take 2 capsules daily (Patient taking differently: Take 500 mg by mouth 2 (two) times daily. ), Disp: , Rfl:  .  ondansetron (ZOFRAN) 4 MG tablet, Take 4 mg by mouth every 8 (eight) hours as needed for nausea or vomiting., Disp: , Rfl:  .  predniSONE (DELTASONE) 5 MG tablet, Take 5 mg by mouth daily with breakfast., Disp: , Rfl:  .  Spacer/Aero-Holding Chambers (AEROCHAMBER MV) inhaler, Use as instructed, Disp: 1 each, Rfl: 0 .  tamsulosin (FLOMAX) 0.4 MG CAPS capsule, Take 0.4 mg by mouth 2 (two) times daily. , Disp: , Rfl:

## 2018-05-30 NOTE — Addendum Note (Signed)
Addended by: Suzzanne Cloud E on: 05/30/2018 04:32 PM   Modules accepted: Orders

## 2018-05-30 NOTE — Addendum Note (Signed)
Addended by: Len Blalock on: 05/30/2018 04:25 PM   Modules accepted: Orders

## 2018-05-30 NOTE — Addendum Note (Signed)
Addended by: Suzzanne Cloud E on: 05/30/2018 04:29 PM   Modules accepted: Orders

## 2018-05-30 NOTE — Addendum Note (Signed)
Addended by: Len Blalock on: 05/30/2018 04:34 PM   Modules accepted: Orders

## 2018-05-31 LAB — HEPATIC FUNCTION PANEL
AG Ratio: 1.9 (calc) (ref 1.0–2.5)
ALT: 19 U/L (ref 9–46)
AST: 15 U/L (ref 10–35)
Albumin: 4 g/dL (ref 3.6–5.1)
Alkaline phosphatase (APISO): 66 U/L (ref 40–115)
Bilirubin, Direct: 0.2 mg/dL (ref 0.0–0.2)
Globulin: 2.1 g/dL (calc) (ref 1.9–3.7)
Indirect Bilirubin: 1.2 mg/dL (calc) (ref 0.2–1.2)
Total Bilirubin: 1.4 mg/dL — ABNORMAL HIGH (ref 0.2–1.2)
Total Protein: 6.1 g/dL (ref 6.1–8.1)

## 2018-06-03 ENCOUNTER — Telehealth: Payer: Self-pay | Admitting: Pulmonary Disease

## 2018-06-03 DIAGNOSIS — M5416 Radiculopathy, lumbar region: Secondary | ICD-10-CM | POA: Diagnosis not present

## 2018-06-03 DIAGNOSIS — M5116 Intervertebral disc disorders with radiculopathy, lumbar region: Secondary | ICD-10-CM | POA: Diagnosis not present

## 2018-06-03 NOTE — Telephone Encounter (Signed)
I called [facility] and they wanted to know pt's drug allergies and diagnosis codes. I gave him the information so he could get the prescription ready for pt. Nothing further is needed.

## 2018-06-05 ENCOUNTER — Telehealth: Payer: Self-pay | Admitting: Pulmonary Disease

## 2018-06-05 NOTE — Telephone Encounter (Signed)
Spoke with Pharmacist at [facility] She states that they have to be the pharm to fill this rx for the pt due to a third party request  I gave verbal ok for rx  Nothing further needed

## 2018-06-09 ENCOUNTER — Telehealth: Payer: Self-pay | Admitting: Pulmonary Disease

## 2018-06-09 NOTE — Telephone Encounter (Signed)
PA request was faxed from [facility] for pt's mycophenylate 551mcg.  PA has been filled out, signed, and faxed back to [facility] at [phone] with a confirmation fax received.    Number to follow up on PA is [phone].  Will hold in my box to follow up on.

## 2018-06-10 DIAGNOSIS — M5416 Radiculopathy, lumbar region: Secondary | ICD-10-CM | POA: Diagnosis not present

## 2018-06-11 ENCOUNTER — Telehealth: Payer: Self-pay | Admitting: Pulmonary Disease

## 2018-06-11 DIAGNOSIS — M5137 Other intervertebral disc degeneration, lumbosacral region: Secondary | ICD-10-CM | POA: Diagnosis not present

## 2018-06-11 DIAGNOSIS — M5126 Other intervertebral disc displacement, lumbar region: Secondary | ICD-10-CM | POA: Diagnosis not present

## 2018-06-11 DIAGNOSIS — M5416 Radiculopathy, lumbar region: Secondary | ICD-10-CM | POA: Diagnosis not present

## 2018-06-11 MED ORDER — SULFAMETHOXAZOLE-TRIMETHOPRIM 800-160 MG PO TABS: 1.0000 | ORAL_TABLET | Freq: Two times a day (BID) | ORAL | 5 refills | Status: DC

## 2018-06-11 NOTE — Telephone Encounter (Signed)
Spoke with [facility] pharmacy regarding the PA.  They stated they did not see anything in the computer and he was having trouble finding the patient in the computer.  I refaxed the forms and told them I would call back tomorrow.

## 2018-06-11 NOTE — Telephone Encounter (Signed)
Called and spoke with [facility]. Clarified Bactrim DS directions.  Nothing further at this time.  Per BQ: Start Bactrim double strength Monday Wednesday Friday: This is an antibiotic which is intended to reduce the likelihood of an infection while you take CellCept. Originally was sent to [facility].   Bactrim 800/160 daily every other day  #12 with 5 refills per BQ.

## 2018-06-11 NOTE — Telephone Encounter (Signed)
From what I gather he is complaining of hoarseness. Unclear if he has a chest infection from that symptom. Would need a visit. However, I think it is fine to start the recommended Bactrim DS M/W/F and if symptoms worsen to come in for visit and possible CXR. Advise salt water gargle and delsym cough syrup.

## 2018-06-11 NOTE — Telephone Encounter (Signed)
Call made to patient, patient states for the last couple weeks he has been hoarse and wanted to let someone know if he needs to do anything regarding this. He states he just started the cellcept but this was already occurring prior to starting cellcept. He also stated that he was supposed to be getting a antibiotic. I informed the patient the medication was sent to [facility] but I would send it to his local pharmacy. Confirmed pharmacy. Order sent. Patient states he did not know whether he was developing something in his chest needed to be concerned about this.   EW please advise if any other recommendations regarding hoarseness. Antibiotic has been sent to local pharmacy.

## 2018-06-11 NOTE — Telephone Encounter (Signed)
What antibiotic was sent in?

## 2018-06-11 NOTE — Telephone Encounter (Addendum)
Received a fax from [employer] that mycophenylate 551mcg is covered until 04/2019.  Still need to check on OFEV.

## 2018-06-11 NOTE — Telephone Encounter (Signed)
Per BQ: Start Bactrim double strength Monday Wednesday Friday: This is an antibiotic which is intended to reduce the likelihood of an infection while you take CellCept. Originally was sent to [place].   Bactrim 800/160 daily every other day  #12 with 5 refills per BQ.

## 2018-06-11 NOTE — Telephone Encounter (Signed)
Called and spoke with Patient. Geraldo Pitter, NP recommendations given.  Understanding stated.  Nothing further at this time.

## 2018-06-13 ENCOUNTER — Telehealth: Payer: Self-pay | Admitting: *Deleted

## 2018-06-13 ENCOUNTER — Telehealth: Payer: Self-pay | Admitting: Pulmonary Disease

## 2018-06-13 NOTE — Telephone Encounter (Signed)
[name], spoke with [facility] Advised him of the approval notification, then denial received. Need clarification as to actual status of the medication.  He checked and the medication was approved until May 14, 2019 PA # [id number] He checked with his lead and confirmed the medication shows as approved in two different places in their system. He will check on Monday to make sure there are no problems and that this processes. If any issues, our office will be called.  As of 06/11/18 notation in chart states: "Received a fax from [facility] that mycophenylate 53mcg is covered until 04/2019.  Still need to check on OFEV"

## 2018-06-13 NOTE — Telephone Encounter (Signed)
   Primary Cardiologist: Shelva Majestic, MD  Chart reviewed as part of pre-operative protocol coverage. Patient was contacted 06/13/2018 in reference to pre-operative risk assessment for pending surgery as outlined below.  [name] was last seen on 05/02/2018 by Dr. Claiborne Billings.  Since that day, [name] has done well w/o any anginal symptoms. He denies CP and dyspnea.   Therefore, based on ACC/AHA guidelines, the patient would be at acceptable risk for the planned procedure without further cardiovascular testing.   Per office protocol, patient can hold Eliquis for 3 days prior to procedure.    I will route this recommendation to the requesting party via Epic fax function and remove from pre-op pool.  Please call with questions.  Lyda Jester, PA-C 06/13/2018, 4:24 PM

## 2018-06-13 NOTE — Telephone Encounter (Signed)
Patient with diagnosis of atrial fibrillation on Eliquis for anticoagulation.    Procedure: left 1-2 laminectomy and micordisectomy Date of procedure: 07/08/2018  CHADS2-VASc score of  2 (, HTN, AGE, )  CrCl 107.7 Platelet count 202  Per office protocol, patient can hold Eliquis for 3 days prior to procedure.

## 2018-06-13 NOTE — Telephone Encounter (Signed)
   [facility] Pre-operative Risk Assessment    Request for surgical clearance:  1. What type of surgery is being performed? Left L 1-2 laminectomy and microdiscectomy   2. When is this surgery scheduled? 07-08-2018   3. What type of clearance is required (medical clearance vs. Pharmacy clearance to hold med vs. Both)? both  4. Are there any medications that need to be held prior to surgery and how long?eliquis-they need direction   5. Practice name and name of physician performing surgery? Dr Mallie Mussel [facility]   6. What is your office phone number [phone]    7.   What is your office fax number [phone]  8.   Anesthesia type (None, local, MAC, general) ? unknown   [name] 06/13/2018, 7:22 AM  _________________________________________________________________   (provider comments below)

## 2018-06-15 ENCOUNTER — Inpatient Hospital Stay (HOSPITAL_COMMUNITY): Payer: PPO | Admitting: Anesthesiology

## 2018-06-15 ENCOUNTER — Inpatient Hospital Stay (HOSPITAL_COMMUNITY)
Admission: EM | Admit: 2018-06-15 | Discharge: 2018-06-24 | DRG: 330 | Disposition: A | Discharge status: Home Health | Payer: PPO | Attending: Internal Medicine | Admitting: Internal Medicine

## 2018-06-15 ENCOUNTER — Other Ambulatory Visit: Payer: Self-pay

## 2018-06-15 ENCOUNTER — Emergency Department (HOSPITAL_COMMUNITY): Payer: PPO

## 2018-06-15 ENCOUNTER — Encounter (HOSPITAL_COMMUNITY)
Admission: EM | Disposition: A | Discharge status: Home Health | Payer: Self-pay | Source: Home / Self Care | Attending: Internal Medicine

## 2018-06-15 ENCOUNTER — Encounter (HOSPITAL_COMMUNITY): Payer: Self-pay | Admitting: Emergency Medicine

## 2018-06-15 DIAGNOSIS — Z888 Allergy status to other drugs, medicaments and biological substances status: Secondary | ICD-10-CM

## 2018-06-15 DIAGNOSIS — K5701 Diverticulitis of small intestine with perforation and abscess with bleeding: Secondary | ICD-10-CM | POA: Diagnosis not present

## 2018-06-15 DIAGNOSIS — R17 Unspecified jaundice: Secondary | ICD-10-CM | POA: Diagnosis present

## 2018-06-15 DIAGNOSIS — R7989 Other specified abnormal findings of blood chemistry: Secondary | ICD-10-CM | POA: Diagnosis present

## 2018-06-15 DIAGNOSIS — Z7901 Long term (current) use of anticoagulants: Secondary | ICD-10-CM | POA: Diagnosis not present

## 2018-06-15 DIAGNOSIS — K56609 Unspecified intestinal obstruction, unspecified as to partial versus complete obstruction: Secondary | ICD-10-CM

## 2018-06-15 DIAGNOSIS — Z825 Family history of asthma and other chronic lower respiratory diseases: Secondary | ICD-10-CM | POA: Diagnosis not present

## 2018-06-15 DIAGNOSIS — K219 Gastro-esophageal reflux disease without esophagitis: Secondary | ICD-10-CM | POA: Diagnosis present

## 2018-06-15 DIAGNOSIS — K5720 Diverticulitis of large intestine with perforation and abscess without bleeding: Secondary | ICD-10-CM | POA: Diagnosis present

## 2018-06-15 DIAGNOSIS — K5700 Diverticulitis of small intestine with perforation and abscess without bleeding: Secondary | ICD-10-CM | POA: Diagnosis not present

## 2018-06-15 DIAGNOSIS — R109 Unspecified abdominal pain: Secondary | ICD-10-CM | POA: Diagnosis present

## 2018-06-15 DIAGNOSIS — Z808 Family history of malignant neoplasm of other organs or systems: Secondary | ICD-10-CM | POA: Diagnosis not present

## 2018-06-15 DIAGNOSIS — K5780 Diverticulitis of intestine, part unspecified, with perforation and abscess without bleeding: Secondary | ICD-10-CM | POA: Diagnosis present

## 2018-06-15 DIAGNOSIS — Z885 Allergy status to narcotic agent status: Secondary | ICD-10-CM | POA: Diagnosis not present

## 2018-06-15 DIAGNOSIS — Z79899 Other long term (current) drug therapy: Secondary | ICD-10-CM

## 2018-06-15 DIAGNOSIS — E785 Hyperlipidemia, unspecified: Secondary | ICD-10-CM | POA: Diagnosis present

## 2018-06-15 DIAGNOSIS — J84112 Idiopathic pulmonary fibrosis: Secondary | ICD-10-CM | POA: Diagnosis not present

## 2018-06-15 DIAGNOSIS — Z8261 Family history of arthritis: Secondary | ICD-10-CM

## 2018-06-15 DIAGNOSIS — I422 Other hypertrophic cardiomyopathy: Secondary | ICD-10-CM | POA: Diagnosis present

## 2018-06-15 DIAGNOSIS — M353 Polymyalgia rheumatica: Secondary | ICD-10-CM | POA: Diagnosis not present

## 2018-06-15 DIAGNOSIS — I10 Essential (primary) hypertension: Secondary | ICD-10-CM | POA: Diagnosis not present

## 2018-06-15 DIAGNOSIS — J849 Interstitial pulmonary disease, unspecified: Secondary | ICD-10-CM | POA: Diagnosis not present

## 2018-06-15 DIAGNOSIS — Z8249 Family history of ischemic heart disease and other diseases of the circulatory system: Secondary | ICD-10-CM | POA: Diagnosis not present

## 2018-06-15 DIAGNOSIS — R000 Tachycardia, unspecified: Secondary | ICD-10-CM | POA: Diagnosis not present

## 2018-06-15 DIAGNOSIS — Z7952 Long term (current) use of systemic steroids: Secondary | ICD-10-CM

## 2018-06-15 DIAGNOSIS — R945 Abnormal results of liver function studies: Secondary | ICD-10-CM | POA: Diagnosis present

## 2018-06-15 DIAGNOSIS — G4733 Obstructive sleep apnea (adult) (pediatric): Secondary | ICD-10-CM | POA: Diagnosis not present

## 2018-06-15 DIAGNOSIS — K631 Perforation of intestine (nontraumatic): Secondary | ICD-10-CM | POA: Diagnosis not present

## 2018-06-15 DIAGNOSIS — K567 Ileus, unspecified: Secondary | ICD-10-CM | POA: Diagnosis not present

## 2018-06-15 DIAGNOSIS — I480 Paroxysmal atrial fibrillation: Secondary | ICD-10-CM | POA: Diagnosis present

## 2018-06-15 DIAGNOSIS — R140 Abdominal distension (gaseous): Secondary | ICD-10-CM | POA: Diagnosis not present

## 2018-06-15 DIAGNOSIS — K5730 Diverticulosis of large intestine without perforation or abscess without bleeding: Secondary | ICD-10-CM | POA: Diagnosis not present

## 2018-06-15 DIAGNOSIS — Z7951 Long term (current) use of inhaled steroids: Secondary | ICD-10-CM | POA: Diagnosis not present

## 2018-06-15 HISTORY — PX: COLOSTOMY: SHX63

## 2018-06-15 HISTORY — PX: COLON RESECTION: SHX5231

## 2018-06-15 HISTORY — PX: LAPAROTOMY: SHX154

## 2018-06-15 LAB — CBC
HCT: 46.4 % (ref 39.0–52.0)
Hemoglobin: 16 g/dL (ref 13.0–17.0)
MCH: 36.5 pg — ABNORMAL HIGH (ref 26.0–34.0)
MCHC: 34.5 g/dL (ref 30.0–36.0)
MCV: 105.9 fL — ABNORMAL HIGH (ref 80.0–100.0)
Platelets: 186 10*3/uL (ref 150–400)
RBC: 4.38 MIL/uL (ref 4.22–5.81)
RDW: 12.6 % (ref 11.5–15.5)
WBC: 9.6 10*3/uL (ref 4.0–10.5)
nRBC: 0 % (ref 0.0–0.2)

## 2018-06-15 LAB — COMPREHENSIVE METABOLIC PANEL
ALT: 108 U/L — ABNORMAL HIGH (ref 0–44)
AST: 48 U/L — ABNORMAL HIGH (ref 15–41)
Albumin: 3 g/dL — ABNORMAL LOW (ref 3.5–5.0)
Alkaline Phosphatase: 124 U/L (ref 38–126)
Anion gap: 10 (ref 5–15)
BUN: 13 mg/dL (ref 8–23)
CO2: 24 mmol/L (ref 22–32)
Calcium: 8.4 mg/dL — ABNORMAL LOW (ref 8.9–10.3)
Chloride: 102 mmol/L (ref 98–111)
Creatinine, Ser: 1.01 mg/dL (ref 0.61–1.24)
GFR calc Af Amer: >60 mL/min (ref >60)
GFR calc non Af Amer: >60 mL/min (ref >60)
Glucose, Bld: 138 mg/dL — ABNORMAL HIGH (ref 70–99)
Potassium: 3.9 mmol/L (ref 3.5–5.1)
Sodium: 136 mmol/L (ref 135–145)
Total Bilirubin: 1.6 mg/dL — ABNORMAL HIGH (ref 0.3–1.2)
Total Protein: 6.5 g/dL (ref 6.5–8.1)

## 2018-06-15 LAB — MRSA PCR SCREENING: MRSA by PCR: NEGATIVE

## 2018-06-15 LAB — PREPARE RBC (CROSSMATCH)

## 2018-06-15 LAB — ABO/RH: ABO/RH(D): A POS

## 2018-06-15 LAB — POCT I-STAT 4, (NA,K, GLUC, HGB,HCT)
Glucose, Bld: 170 mg/dL — ABNORMAL HIGH (ref 70–99)
HCT: 33 % — ABNORMAL LOW (ref 39.0–52.0)
Hemoglobin: 11.2 g/dL — ABNORMAL LOW (ref 13.0–17.0)
Potassium: 4.5 mmol/L (ref 3.5–5.1)
Sodium: 135 mmol/L (ref 135–145)

## 2018-06-15 LAB — LIPASE, BLOOD: Lipase: 29 U/L (ref 11–51)

## 2018-06-15 SURGERY — LAPAROTOMY, EXPLORATORY
Anesthesia: General

## 2018-06-15 MED ORDER — ONDANSETRON HCL 4 MG/2ML IJ SOLN
4.0000 mg | Freq: Once | INTRAMUSCULAR | Status: AC
  Administered 2018-06-15: 4 mg via INTRAVENOUS
  Filled 2018-06-15: qty 2

## 2018-06-15 MED ORDER — MOMETASONE FURO-FORMOTEROL FUM 100-5 MCG/ACT IN AERO
2.0000 | INHALATION_SPRAY | Freq: Two times a day (BID) | RESPIRATORY_TRACT | Status: DC
  Administered 2018-06-16 – 2018-06-24 (×16): 2 via RESPIRATORY_TRACT
  Filled 2018-06-15: qty 8.8

## 2018-06-15 MED ORDER — FENTANYL CITRATE (PF) 100 MCG/2ML IJ SOLN
INTRAMUSCULAR | Status: DC | PRN
  Administered 2018-06-15 (×2): 50 ug via INTRAVENOUS

## 2018-06-15 MED ORDER — SODIUM CHLORIDE 0.9% IV SOLUTION
Freq: Once | INTRAVENOUS | Status: AC
  Administered 2018-06-15: 200 mL via INTRAVENOUS

## 2018-06-15 MED ORDER — SODIUM CHLORIDE 0.9 % IV SOLN
2.0000 g | Freq: Once | INTRAVENOUS | Status: AC
  Administered 2018-06-15: 2 g via INTRAVENOUS
  Filled 2018-06-15: qty 2

## 2018-06-15 MED ORDER — HYDROMORPHONE HCL 1 MG/ML IJ SOLN
INTRAMUSCULAR | Status: AC
  Filled 2018-06-15: qty 1

## 2018-06-15 MED ORDER — METHYLPREDNISOLONE SODIUM SUCC 40 MG IJ SOLR
20.0000 mg | Freq: Every day | INTRAMUSCULAR | Status: DC
  Administered 2018-06-16 – 2018-06-19 (×4): 20 mg via INTRAVENOUS
  Filled 2018-06-15 (×4): qty 1

## 2018-06-15 MED ORDER — SODIUM CHLORIDE 0.9% FLUSH
3.0000 mL | Freq: Once | INTRAVENOUS | Status: AC
  Administered 2018-06-15: 3 mL via INTRAVENOUS

## 2018-06-15 MED ORDER — SODIUM CHLORIDE 0.9 % IR SOLN
Status: DC | PRN
  Administered 2018-06-15 (×4): 1000 mL

## 2018-06-15 MED ORDER — METRONIDAZOLE IN NACL 5-0.79 MG/ML-% IV SOLN
500.0000 mg | Freq: Three times a day (TID) | INTRAVENOUS | Status: DC
  Administered 2018-06-15 – 2018-06-16 (×2): 500 mg via INTRAVENOUS
  Filled 2018-06-15 (×2): qty 100

## 2018-06-15 MED ORDER — HEPARIN SODIUM (PORCINE) 5000 UNIT/ML IJ SOLN
5000.0000 [IU] | Freq: Three times a day (TID) | INTRAMUSCULAR | Status: DC
  Administered 2018-06-16 – 2018-06-17 (×3): 5000 [IU] via SUBCUTANEOUS
  Filled 2018-06-15 (×3): qty 1

## 2018-06-15 MED ORDER — PANTOPRAZOLE SODIUM 40 MG IV SOLR
40.0000 mg | Freq: Every day | INTRAVENOUS | Status: DC
  Administered 2018-06-16 – 2018-06-23 (×8): 40 mg via INTRAVENOUS
  Filled 2018-06-15 (×8): qty 40

## 2018-06-15 MED ORDER — OXYMETAZOLINE HCL 0.05 % NA SOLN
NASAL | Status: DC | PRN
  Administered 2018-06-15: 4 via NASAL

## 2018-06-15 MED ORDER — ALBUTEROL SULFATE (2.5 MG/3ML) 0.083% IN NEBU
2.5000 mg | INHALATION_SOLUTION | Freq: Four times a day (QID) | RESPIRATORY_TRACT | Status: DC
  Administered 2018-06-16 (×2): 2.5 mg via RESPIRATORY_TRACT
  Filled 2018-06-15 (×3): qty 3

## 2018-06-15 MED ORDER — SUGAMMADEX SODIUM 500 MG/5ML IV SOLN
INTRAVENOUS | Status: DC | PRN
  Administered 2018-06-15: 250 mg via INTRAVENOUS

## 2018-06-15 MED ORDER — OXYMETAZOLINE HCL 0.05 % NA SOLN
NASAL | Status: AC
  Filled 2018-06-15: qty 15

## 2018-06-15 MED ORDER — DEXAMETHASONE SODIUM PHOSPHATE 10 MG/ML IJ SOLN
INTRAMUSCULAR | Status: AC
  Filled 2018-06-15: qty 1

## 2018-06-15 MED ORDER — METRONIDAZOLE IN NACL 5-0.79 MG/ML-% IV SOLN
500.0000 mg | Freq: Once | INTRAVENOUS | Status: AC
  Administered 2018-06-15: 500 mg via INTRAVENOUS
  Filled 2018-06-15: qty 100

## 2018-06-15 MED ORDER — HYDROMORPHONE HCL 1 MG/ML IJ SOLN
0.2500 mg | INTRAMUSCULAR | Status: DC | PRN
  Administered 2018-06-15 (×2): 0.5 mg via INTRAVENOUS
  Administered 2018-06-15: 0.25 mg via INTRAVENOUS
  Administered 2018-06-15: 0.5 mg via INTRAVENOUS

## 2018-06-15 MED ORDER — SUGAMMADEX SODIUM 500 MG/5ML IV SOLN
INTRAVENOUS | Status: AC
  Filled 2018-06-15: qty 5

## 2018-06-15 MED ORDER — PIPERACILLIN-TAZOBACTAM 3.375 G IVPB
3.3750 g | Freq: Three times a day (TID) | INTRAVENOUS | Status: DC
  Administered 2018-06-15 – 2018-06-23 (×23): 3.375 g via INTRAVENOUS
  Filled 2018-06-15 (×22): qty 50

## 2018-06-15 MED ORDER — SODIUM CHLORIDE 0.9 % IV BOLUS
1000.0000 mL | Freq: Once | INTRAVENOUS | Status: AC
  Administered 2018-06-15: 1000 mL via INTRAVENOUS

## 2018-06-15 MED ORDER — MEPERIDINE HCL 50 MG/ML IJ SOLN: 6.2500 mg | INTRAMUSCULAR | Status: DC | PRN

## 2018-06-15 MED ORDER — FLUTICASONE PROPIONATE 50 MCG/ACT NA SUSP: 2.0000 | Freq: Every day | NASAL | Status: DC | PRN

## 2018-06-15 MED ORDER — ROCURONIUM BROMIDE 100 MG/10ML IV SOLN
INTRAVENOUS | Status: AC
  Filled 2018-06-15: qty 1

## 2018-06-15 MED ORDER — FENTANYL CITRATE (PF) 250 MCG/5ML IJ SOLN
INTRAMUSCULAR | Status: DC | PRN
  Administered 2018-06-15: 25 ug via INTRAVENOUS
  Administered 2018-06-15: 50 ug via INTRAVENOUS
  Administered 2018-06-15 (×6): 25 ug via INTRAVENOUS
  Administered 2018-06-15: 100 ug via INTRAVENOUS
  Administered 2018-06-15: 50 ug via INTRAVENOUS
  Administered 2018-06-15: 25 ug via INTRAVENOUS

## 2018-06-15 MED ORDER — PROPOFOL 10 MG/ML IV BOLUS
INTRAVENOUS | Status: AC
  Filled 2018-06-15: qty 20

## 2018-06-15 MED ORDER — ACETAMINOPHEN 10 MG/ML IV SOLN
1000.0000 mg | Freq: Once | INTRAVENOUS | Status: AC
  Administered 2018-06-15: 1000 mg via INTRAVENOUS

## 2018-06-15 MED ORDER — MIDAZOLAM HCL 2 MG/2ML IJ SOLN: 0.5000 mg | Freq: Once | INTRAMUSCULAR | Status: DC | PRN

## 2018-06-15 MED ORDER — ONDANSETRON HCL 4 MG/2ML IJ SOLN: 4.0000 mg | Freq: Four times a day (QID) | INTRAMUSCULAR | Status: DC | PRN

## 2018-06-15 MED ORDER — ACETAMINOPHEN 650 MG RE SUPP: 650.0000 mg | Freq: Four times a day (QID) | RECTAL | Status: DC | PRN

## 2018-06-15 MED ORDER — FENTANYL CITRATE (PF) 100 MCG/2ML IJ SOLN
25.0000 ug | INTRAMUSCULAR | Status: DC | PRN
  Administered 2018-06-16 (×7): 50 ug via INTRAVENOUS
  Filled 2018-06-15 (×7): qty 2

## 2018-06-15 MED ORDER — SODIUM CHLORIDE (PF) 0.9 % IJ SOLN
INTRAMUSCULAR | Status: AC
  Filled 2018-06-15: qty 50

## 2018-06-15 MED ORDER — METOPROLOL TARTRATE 5 MG/5ML IV SOLN
5.0000 mg | Freq: Three times a day (TID) | INTRAVENOUS | Status: DC
  Administered 2018-06-15 – 2018-06-19 (×11): 5 mg via INTRAVENOUS
  Filled 2018-06-15 (×11): qty 5

## 2018-06-15 MED ORDER — SODIUM CHLORIDE 0.9 % IV SOLN
INTRAVENOUS | Status: DC | PRN
  Administered 2018-06-15: 10 ug/min via INTRAVENOUS

## 2018-06-15 MED ORDER — DEXAMETHASONE SODIUM PHOSPHATE 10 MG/ML IJ SOLN
INTRAMUSCULAR | Status: DC | PRN
  Administered 2018-06-15: 10 mg via INTRAVENOUS

## 2018-06-15 MED ORDER — EMPTY CONTAINERS FLEXIBLE MISC
900.0000 mg | Freq: Once | Status: DC
  Filled 2018-06-15: qty 90

## 2018-06-15 MED ORDER — PANTOPRAZOLE SODIUM 40 MG IV SOLR
40.0000 mg | Freq: Two times a day (BID) | INTRAVENOUS | Status: DC
  Administered 2018-06-15: 40 mg via INTRAVENOUS
  Filled 2018-06-15: qty 40

## 2018-06-15 MED ORDER — ONDANSETRON HCL 4 MG/2ML IJ SOLN
INTRAMUSCULAR | Status: AC
  Filled 2018-06-15: qty 2

## 2018-06-15 MED ORDER — ONDANSETRON HCL 4 MG PO TABS: 4.0000 mg | ORAL_TABLET | Freq: Four times a day (QID) | ORAL | Status: DC | PRN

## 2018-06-15 MED ORDER — PHENYLEPHRINE HCL 10 MG/ML IJ SOLN
INTRAMUSCULAR | Status: AC
  Filled 2018-06-15: qty 1

## 2018-06-15 MED ORDER — HYDROMORPHONE HCL 1 MG/ML IJ SOLN
1.0000 mg | Freq: Once | INTRAMUSCULAR | Status: AC
  Administered 2018-06-15: 1 mg via INTRAVENOUS
  Filled 2018-06-15: qty 1

## 2018-06-15 MED ORDER — FENTANYL CITRATE (PF) 250 MCG/5ML IJ SOLN
INTRAMUSCULAR | Status: AC
  Filled 2018-06-15: qty 5

## 2018-06-15 MED ORDER — SUCCINYLCHOLINE CHLORIDE 200 MG/10ML IV SOSY
PREFILLED_SYRINGE | INTRAVENOUS | Status: DC | PRN
  Administered 2018-06-15: 120 mg via INTRAVENOUS

## 2018-06-15 MED ORDER — MIDAZOLAM HCL 2 MG/2ML IJ SOLN
INTRAMUSCULAR | Status: AC
  Filled 2018-06-15: qty 2

## 2018-06-15 MED ORDER — LACTATED RINGERS IV SOLN
INTRAVENOUS | Status: DC | PRN
  Administered 2018-06-15: 19:00:00 via INTRAVENOUS

## 2018-06-15 MED ORDER — METHOCARBAMOL 1000 MG/10ML IJ SOLN
500.0000 mg | Freq: Four times a day (QID) | INTRAVENOUS | Status: DC | PRN
  Filled 2018-06-15: qty 5

## 2018-06-15 MED ORDER — SODIUM CHLORIDE 0.9 % IV SOLN
INTRAVENOUS | Status: DC
  Administered 2018-06-17 – 2018-06-22 (×7): via INTRAVENOUS

## 2018-06-15 MED ORDER — SODIUM CHLORIDE 0.9 % IV SOLN
INTRAVENOUS | Status: DC | PRN
  Administered 2018-06-15: 20:00:00 via INTRAVENOUS

## 2018-06-15 MED ORDER — ROCURONIUM BROMIDE 10 MG/ML (PF) SYRINGE
PREFILLED_SYRINGE | INTRAVENOUS | Status: DC | PRN
  Administered 2018-06-15: 40 mg via INTRAVENOUS

## 2018-06-15 MED ORDER — IOPAMIDOL (ISOVUE-300) INJECTION 61%
100.0000 mL | Freq: Once | INTRAVENOUS | Status: AC | PRN
  Administered 2018-06-15: 100 mL via INTRAVENOUS

## 2018-06-15 MED ORDER — MORPHINE SULFATE (PF) 4 MG/ML IV SOLN
4.0000 mg | Freq: Once | INTRAVENOUS | Status: AC
  Administered 2018-06-15: 4 mg via INTRAVENOUS
  Filled 2018-06-15: qty 1

## 2018-06-15 MED ORDER — ACETAMINOPHEN 10 MG/ML IV SOLN
INTRAVENOUS | Status: AC
  Filled 2018-06-15: qty 100

## 2018-06-15 MED ORDER — ONDANSETRON HCL 4 MG/2ML IJ SOLN
INTRAMUSCULAR | Status: DC | PRN
  Administered 2018-06-15: 4 mg via INTRAVENOUS

## 2018-06-15 MED ORDER — ONDANSETRON 4 MG PO TBDP: 4.0000 mg | ORAL_TABLET | Freq: Four times a day (QID) | ORAL | Status: DC | PRN

## 2018-06-15 MED ORDER — PIPERACILLIN-TAZOBACTAM 3.375 G IVPB: 3.3750 g | Freq: Three times a day (TID) | INTRAVENOUS | Status: DC

## 2018-06-15 MED ORDER — HYDROMORPHONE HCL 1 MG/ML IJ SOLN
1.0000 mg | INTRAMUSCULAR | Status: DC | PRN
  Administered 2018-06-15 – 2018-06-16 (×4): 1 mg via INTRAVENOUS
  Filled 2018-06-15 (×4): qty 1

## 2018-06-15 MED ORDER — PROTHROMBIN COMPLEX CONC HUMAN 500 UNITS IV KIT
4366.0000 [IU] | PACK | Status: AC
  Administered 2018-06-15: 4366 [IU] via INTRAVENOUS
  Filled 2018-06-15: qty 4366

## 2018-06-15 MED ORDER — ACETAMINOPHEN 325 MG PO TABS: 650.0000 mg | ORAL_TABLET | Freq: Four times a day (QID) | ORAL | Status: DC | PRN

## 2018-06-15 MED ORDER — KCL IN DEXTROSE-NACL 20-5-0.9 MEQ/L-%-% IV SOLN: INTRAVENOUS | Status: DC

## 2018-06-15 MED ORDER — SODIUM CHLORIDE 0.9 % IV BOLUS: 500.0000 mL | Freq: Once | INTRAVENOUS | Status: DC

## 2018-06-15 MED ORDER — MIDAZOLAM HCL 5 MG/5ML IJ SOLN
INTRAMUSCULAR | Status: DC | PRN
  Administered 2018-06-15: 1 mg via INTRAVENOUS
  Administered 2018-06-15 (×2): 0.5 mg via INTRAVENOUS
  Administered 2018-06-15: 2 mg via INTRAVENOUS

## 2018-06-15 MED ORDER — SODIUM CHLORIDE 0.9 % IV SOLN
INTRAVENOUS | Status: DC | PRN
  Administered 2018-06-15: 19:00:00 via INTRAVENOUS

## 2018-06-15 MED ORDER — IOPAMIDOL (ISOVUE-300) INJECTION 61%
INTRAVENOUS | Status: AC
  Filled 2018-06-15: qty 100

## 2018-06-15 MED ORDER — PROMETHAZINE HCL 25 MG/ML IJ SOLN: 6.2500 mg | INTRAMUSCULAR | Status: DC | PRN

## 2018-06-15 MED ORDER — SUCCINYLCHOLINE CHLORIDE 200 MG/10ML IV SOSY
PREFILLED_SYRINGE | INTRAVENOUS | Status: AC
  Filled 2018-06-15: qty 10

## 2018-06-15 MED ORDER — PROPOFOL 10 MG/ML IV BOLUS
INTRAVENOUS | Status: DC | PRN
  Administered 2018-06-15: 100 mg via INTRAVENOUS

## 2018-06-15 SURGICAL SUPPLY — 63 items
ADH SKN CLS APL DERMABOND .7 (GAUZE/BANDAGES/DRESSINGS)
APL SWBSTK 6 STRL LF DISP (MISCELLANEOUS) ×1
APPLICATOR COTTON TIP 6 STRL (MISCELLANEOUS) ×1 IMPLANT
APPLICATOR COTTON TIP 6IN STRL (MISCELLANEOUS) ×2
APPLIER CLIP 5 13 M/L LIGAMAX5 (MISCELLANEOUS)
APR CLP MED LRG 5 ANG JAW (MISCELLANEOUS)
BLADE EXTENDED COATED 6.5IN (ELECTRODE) IMPLANT
BLADE HEX COATED 2.75 (ELECTRODE) ×2 IMPLANT
BNDG GAUZE ELAST 4 BULKY (GAUZE/BANDAGES/DRESSINGS) ×1 IMPLANT
CLIP APPLIE 5 13 M/L LIGAMAX5 (MISCELLANEOUS) IMPLANT
COUNTER NEEDLE 20 DBL MAG RED (NEEDLE) ×2 IMPLANT
COVER MAYO STAND STRL (DRAPES) ×6 IMPLANT
COVER WAND RF STERILE (DRAPES) IMPLANT
DECANTER SPIKE VIAL GLASS SM (MISCELLANEOUS) ×2 IMPLANT
DERMABOND ADVANCED (GAUZE/BANDAGES/DRESSINGS)
DERMABOND ADVANCED .7 DNX12 (GAUZE/BANDAGES/DRESSINGS) IMPLANT
DRAPE LAPAROSCOPIC ABDOMINAL (DRAPES) ×2 IMPLANT
DRAPE WARM FLUID 44X44 (DRAPE) ×1 IMPLANT
DRSG OPSITE POSTOP 4X10 (GAUZE/BANDAGES/DRESSINGS) IMPLANT
DRSG OPSITE POSTOP 4X6 (GAUZE/BANDAGES/DRESSINGS) IMPLANT
DRSG OPSITE POSTOP 4X8 (GAUZE/BANDAGES/DRESSINGS) IMPLANT
ELECT PENCIL ROCKER SW 15FT (MISCELLANEOUS) ×4 IMPLANT
ELECT REM PT RETURN 15FT ADLT (MISCELLANEOUS) ×2 IMPLANT
GAUZE SPONGE 2X2 8PLY STRL LF (GAUZE/BANDAGES/DRESSINGS) IMPLANT
GAUZE SPONGE 4X4 12PLY STRL (GAUZE/BANDAGES/DRESSINGS) ×2 IMPLANT
GLOVE BIO SURGEON STRL SZ7.5 (GLOVE) ×4 IMPLANT
GLOVE BIOGEL PI IND STRL 7.0 (GLOVE) ×1 IMPLANT
GLOVE BIOGEL PI INDICATOR 7.0 (GLOVE) ×1
GOWN STRL REUS W/ TWL XL LVL3 (GOWN DISPOSABLE) ×1 IMPLANT
GOWN STRL REUS W/TWL LRG LVL3 (GOWN DISPOSABLE) ×2 IMPLANT
GOWN STRL REUS W/TWL XL LVL3 (GOWN DISPOSABLE) ×10 IMPLANT
HANDLE SUCTION POOLE (INSTRUMENTS) IMPLANT
KIT BASIN OR (CUSTOM PROCEDURE TRAY) ×2 IMPLANT
LEGGING LITHOTOMY PAIR STRL (DRAPES) ×2 IMPLANT
LIGASURE IMPACT 36 18CM CVD LR (INSTRUMENTS) ×1 IMPLANT
NS IRRIG 1000ML POUR BTL (IV SOLUTION) ×6 IMPLANT
PACK COLON (CUSTOM PROCEDURE TRAY) ×2 IMPLANT
PACK GENERAL/GYN (CUSTOM PROCEDURE TRAY) ×2 IMPLANT
PAD ABD 8X10 STRL (GAUZE/BANDAGES/DRESSINGS) ×1 IMPLANT
SPONGE GAUZE 2X2 STER 10/PKG (GAUZE/BANDAGES/DRESSINGS)
SPONGE LAP 18X18 RF (DISPOSABLE) ×1 IMPLANT
STAPLER CUT CVD 40MM GREEN (STAPLE) ×1 IMPLANT
STAPLER CUT RELOAD GREEN (STAPLE) ×1 IMPLANT
STAPLER VISISTAT 35W (STAPLE) ×2 IMPLANT
SUCTION POOLE HANDLE (INSTRUMENTS) ×2
SURGILUBE 2OZ TUBE FLIPTOP (MISCELLANEOUS) IMPLANT
SUT PDS AB 1 CTX 36 (SUTURE) IMPLANT
SUT PDS AB 1 TP1 96 (SUTURE) ×2 IMPLANT
SUT PROLENE 2 0 SH DA (SUTURE) ×2 IMPLANT
SUT SILK 2 0 (SUTURE) ×2
SUT SILK 2 0 SH CR/8 (SUTURE) ×3 IMPLANT
SUT SILK 2-0 18XBRD TIE 12 (SUTURE) ×1 IMPLANT
SUT SILK 3 0 (SUTURE) ×2
SUT SILK 3 0 SH CR/8 (SUTURE) ×2 IMPLANT
SUT SILK 3-0 18XBRD TIE 12 (SUTURE) ×1 IMPLANT
TAPE CLOTH SURG 6X10 WHT LF (GAUZE/BANDAGES/DRESSINGS) ×1 IMPLANT
TOWEL OR 17X26 10 PK STRL BLUE (TOWEL DISPOSABLE) ×4 IMPLANT
TOWEL OR NON WOVEN STRL DISP B (DISPOSABLE) ×2 IMPLANT
TRAY FOLEY MTR SLVR 16FR STAT (SET/KITS/TRAYS/PACK) ×1 IMPLANT
TUBE ANAEROBIC SPECIMEN COL (MISCELLANEOUS) ×1 IMPLANT
TUBING CONNECTING 10 (TUBING) IMPLANT
WATER STERILE IRR 1000ML POUR (IV SOLUTION) ×2 IMPLANT
YANKAUER SUCT BULB TIP NO VENT (SUCTIONS) IMPLANT

## 2018-06-15 NOTE — Anesthesia Procedure Notes (Signed)
Procedure Name: Intubation Date/Time: 06/15/2018 7:05 PM Performed by: Cynda Familia, CRNA Pre-anesthesia Checklist: Patient identified, Emergency Drugs available, Suction available and Patient being monitored Patient Re-evaluated:Patient Re-evaluated prior to induction Oxygen Delivery Method: Circle System Utilized Preoxygenation: Pre-oxygenation with 100% oxygen Induction Type: IV induction, Rapid sequence and Cricoid Pressure applied Grade View: Grade I Tube type: Subglottic suction tube Tube size: 7.5 mm Number of attempts: 1 Airway Equipment and Method: Stylet and Video-laryngoscopy Placement Confirmation: ETT inserted through vocal cords under direct vision,  positive ETCO2 and breath sounds checked- equal and bilateral Secured at: 24 cm Tube secured with: Tape Dental Injury: Teeth and Oropharynx as per pre-operative assessment  Comments: Smooth RSI-- by Glennon Mac-- intubation AM CRNA with Glidescope-- atraumatic-- teeth and mouth as preop-- PT WITH HX OF DIFFICULT INTUBATION-- bilat BS Glennon Mac

## 2018-06-15 NOTE — Anesthesia Procedure Notes (Signed)
Date/Time: 06/15/2018 8:37 PM Performed by: Cynda Familia, CRNA Oxygen Delivery Method: Simple face mask Placement Confirmation: positive ETCO2 and breath sounds checked- equal and bilateral Dental Injury: Teeth and Oropharynx as per pre-operative assessment

## 2018-06-15 NOTE — ED Notes (Signed)
Bed: WA07 Expected date:  Expected time:  Means of arrival:  Comments: Hold triage 1

## 2018-06-15 NOTE — Anesthesia Preprocedure Evaluation (Addendum)
Anesthesia Evaluation  Patient identified by MRN, date of birth, ID band Patient awake    Reviewed: Allergy & Precautions, NPO status , Patient's Chart, lab work & pertinent test results, reviewed documented beta blocker date and time   History of Anesthesia Complications (+) DIFFICULT AIRWAY and history of anesthetic complications (anaphylaxis with lidocaine (but can take Marcaine without problems), difficult DL, but easy VideoGlide intubation )  Airway Mallampati: III  TM Distance: >3 FB Neck ROM: Full    Dental  (+) Dental Advisory Given   Pulmonary sleep apnea and Continuous Positive Airway Pressure Ventilation , COPD,  COPD inhaler,  Pulmonary fibrosis: steroids   breath sounds clear to auscultation       Cardiovascular hypertension, Pt. on medications and Pt. on home beta blockers (-) angina+ dysrhythmias Atrial Fibrillation  Rhythm:Regular Rate:Normal  '17 ECHO: EF 60-65%, valves OK '17 Stress: EF: 61%, normal. low risk study, no ST segment deviation   Neuro/Psych Back pain    GI/Hepatic Neg liver ROS, GERD  Controlled,Free air: perf of sigmaoid colon   Endo/Other  negative endocrine ROS  Renal/GU negative Renal ROS     Musculoskeletal  (+) Arthritis , Osteoarthritis,  Polymyalgia rheumatica: steroids   Abdominal   Peds  Hematology eliquis   Anesthesia Other Findings   Reproductive/Obstetrics                           Anesthesia Physical Anesthesia Plan  ASA: III and emergent  Anesthesia Plan: General   Post-op Pain Management:    Induction: Intravenous, Rapid sequence and Cricoid pressure planned  PONV Risk Score and Plan: 3 and Ondansetron, Dexamethasone and Treatment may vary due to age or medical condition  Airway Management Planned: Oral ETT and Video Laryngoscope Planned  Additional Equipment:   Intra-op Plan:   Post-operative Plan: Possible Post-op  intubation/ventilation  Informed Consent: I have reviewed the patients History and Physical, chart, labs and discussed the procedure including the risks, benefits and alternatives for the proposed anesthesia with the patient or authorized representative who has indicated his/her understanding and acceptance.     Dental advisory given  Plan Discussed with: CRNA and Surgeon  Anesthesia Plan Comments: (Plan routine monitors, GETA with rapid sequence induction)    Anesthesia Quick Evaluation

## 2018-06-15 NOTE — Consult Note (Signed)
Reason for Consult:abd pain Referring Physician: Dr. Meyer Cory Burack is an 73 y.o. male.  HPI: The patient is a 73 year old white male who presents with abdominal pain that started about 10:00 this morning.  He describes the pain is severe.  He denies any fevers or chills.  He denies any nausea or vomiting.  He is on prednisone and a rejection medicine for pulmonary fibrosis.  He is also on Eliquis for atrial fibrillation.  He took all of these medicines this morning.  Past Medical History:  Diagnosis Date  . Arthritis   . Chest pain    a. 03/2017: echo showing EF of 60-65%, no regional WMA or significant valve abnormalities. b. 03/2016: NST with no evidence of ischemia.   . Digestive problems    on Prednisone prn for this issue- diarrhea  . Dyspnea    with activity  . Dysrhythmia    irregular due to Myocarditis- takes Atenolol, Norvasc  . GERD (gastroesophageal reflux disease)   . H/O viral myocarditis    25 years ago  . Head injury, closed, with concussion    Breif LOC  . Hyperlipidemia   . Mild mitral valve prolapse    per Dr Evette Georges notes  . PAF (paroxysmal atrial fibrillation) ([place])    a. initially occuring in 02/2016. b. recurrent in 07/2016. Placed on Eliquis  . Polymyalgia rheumatica ([place])   . Pre-diabetes   . Sleep apnea    mild sleep apnea   no CPAP    Past Surgical History:  Procedure Laterality Date  . apendectomy  1965  . APPENDECTOMY    . bone spur Bilateral 1999   feet  . CARDIAC CATHETERIZATION  2001  . CHOLECYSTECTOMY  2004  . COLONOSCOPY    . EYE SURGERY Bilateral    cataract removal  . LUMBAR LAMINECTOMY/ DECOMPRESSION WITH MET-RX Right 05/05/2013   Procedure: Right Lumbar three-four Extraforaminal Microdiskectomy with Metrex;  Surgeon: [name], MD;  Location: [facility];  Service: Neurosurgery;  Laterality: Right;  Right Lumbar three-four Extraforaminal Microdiskectomy with Metrex  . LUNG BIOPSY Right 02/19/2018   Procedure: LUNG  BIOPSY;  Surgeon: [name], MD;  Location: [facility];  Service: Thoracic;  Laterality: Right;  . SHOULDER OPEN ROTATOR CUFF REPAIR Bilateral 2001  . TONSILLECTOMY    . VIDEO ASSISTED THORACOSCOPY Right 02/19/2018   Procedure: VIDEO ASSISTED THORACOSCOPY;  Surgeon: [name], MD;  Location: [facility];  Service: Thoracic;  Laterality: Right;    Family History  Problem Relation Age of Onset  . Rheum arthritis Mother   . Thyroid cancer Mother   . Heart disease Father   . Asthma Father   . Thyroid cancer Sister   . Melanoma Brother     Social History:  reports that he has never smoked. He has never used smokeless tobacco. He reports current alcohol use of about 21.0 standard drinks of alcohol per week. He reports that he does not use drugs.  Allergies:  Allergies  Allergen Reactions  . Lidocaine Hcl Anaphylaxis and Other (See Comments)    Xylocaine  . Xylocaine [Lidocaine] Anaphylaxis  . Codeine Nausea And Vomiting  . Crestor [Rosuvastatin Calcium] Other (See Comments)    All-over body aches Myalgias   . Statins Other (See Comments)    Muscle soreness and an aching feeling all over Myalgias  . Percocet [Oxycodone-Acetaminophen] Itching    Medications: I have reviewed the patient's current medications.  Results for orders placed or performed during  the hospital encounter of 06/15/18 (from the past 48 hour(s))  Lipase, blood     Status: None   Collection Time: 06/15/18  1:55 PM  Result Value Ref Range   Lipase 29 11 - 51 U/L    Comment: Performed at [facility], [place] [address], [place], [place] [address]  Comprehensive metabolic panel     Status: Abnormal   Collection Time: 06/15/18  1:55 PM  Result Value Ref Range   Sodium 136 135 - 145 mmol/L   Potassium 3.9 3.5 - 5.1 mmol/L   Chloride 102 98 - 111 mmol/L   CO2 24 22 - 32 mmol/L   Glucose, Bld 138 (H) 70 - 99 mg/dL   BUN 13 8 - 23 mg/dL   Creatinine, Ser 1.01 0.61 - 1.24 mg/dL    Calcium 8.4 (L) 8.9 - 10.3 mg/dL   Total Protein 6.5 6.5 - 8.1 g/dL   Albumin 3.0 (L) 3.5 - 5.0 g/dL   AST 48 (H) 15 - 41 U/L   ALT 108 (H) 0 - 44 U/L   Alkaline Phosphatase 124 38 - 126 U/L   Total Bilirubin 1.6 (H) 0.3 - 1.2 mg/dL   GFR calc non Af Amer >60 >60 mL/min   GFR calc Af Amer >60 >60 mL/min   Anion gap 10 5 - 15    Comment: Performed at [facility], [place] [address], [place], [place] [address]  CBC     Status: Abnormal   Collection Time: 06/15/18  1:55 PM  Result Value Ref Range   WBC 9.6 4.0 - 10.5 K/uL   RBC 4.38 4.22 - 5.81 MIL/uL   Hemoglobin 16.0 13.0 - 17.0 g/dL   HCT 46.4 39.0 - 52.0 %   MCV 105.9 (H) 80.0 - 100.0 fL   MCH 36.5 (H) 26.0 - 34.0 pg   MCHC 34.5 30.0 - 36.0 g/dL   RDW 12.6 11.5 - 15.5 %   Platelets 186 150 - 400 K/uL   nRBC 0.0 0.0 - 0.2 %    Comment: Performed at [facility], [place] [address], [place],  [address]    Ct Abdomen Pelvis W Contrast  Result Date: 06/15/2018 CLINICAL DATA:  Acute generalized abdominal pain. EXAM: CT ABDOMEN AND PELVIS WITH CONTRAST TECHNIQUE: Multidetector CT imaging of the abdomen and pelvis was performed using the standard protocol following bolus administration of intravenous contrast. CONTRAST:  125m ISOVUE-300 IOPAMIDOL (ISOVUE-300) INJECTION 61% COMPARISON:  CT scan of Sep 22, 2012. FINDINGS: Lower chest: No acute abnormality. Hepatobiliary: No focal liver abnormality is seen. Status post cholecystectomy. No biliary dilatation. Pancreas: Unremarkable. No pancreatic ductal dilatation or surrounding inflammatory changes. Spleen: Normal in size without focal abnormality. Adrenals/Urinary Tract: Adrenal glands are unremarkable. Kidneys are normal, without renal calculi, focal lesion, or hydronephrosis. Bladder is unremarkable. Stomach/Bowel: The stomach appears normal. Status post appendectomy. Proximal sigmoid diverticulitis is noted with a large amount of free air in the adjacent  peritoneal fat and extending into the epigastric region consistent with perforation. 5.2 x 4.3 cm fluid collection with air-fluid level is noted in this area concerning for contained perforation or developing abscess. Mild small bowel dilatation is noted which may represent ileus or possibly obstruction. Vascular/Lymphatic: No significant vascular findings are present. No enlarged abdominal or pelvic lymph nodes. Reproductive: Prostate is unremarkable. Other: Mild fat containing left inguinal hernia is noted. Musculoskeletal: Status post surgical posterior fusion of L2-3, L3-4 and L4-5. No acute osseous abnormality is noted. IMPRESSION:  Proximal sigmoid diverticulitis is noted with a large amount of free air in the adjacent peritoneal fat and extending in the epigastric region consistent with perforation. 5.2 x 4.3 cm fluid collection with air-fluid level is noted in this area concerning for contained perforation or developing abscess. Mild small bowel dilatation is noted which may represent ileus or possibly obstruction. Critical Value/emergent results were called by telephone at the time of interpretation on 06/15/2018 at 3:13 pm to Dr. Reece Agar , who verbally acknowledged these results. Electronically Signed   By: Marijo Conception, M.D.   On: 06/15/2018 15:14    Review of Systems  Constitutional: Negative.   HENT: Negative.   Eyes: Negative.   Respiratory: Negative.   Cardiovascular: Negative.   Gastrointestinal: Positive for abdominal pain. Negative for nausea and vomiting.  Genitourinary: Negative.   Musculoskeletal: Negative.   Skin: Negative.   Neurological: Negative.   Endo/Heme/Allergies: Negative.   Psychiatric/Behavioral: Negative.    Blood pressure 136/85, pulse (!) 120, temperature 98.6 F (37 C), temperature source Oral, resp. rate 19, SpO2 93 %. Physical Exam  Constitutional: He is oriented to person, place, and time. He appears well-developed and well-nourished. No distress.   HENT:  Head: Normocephalic and atraumatic.  Mouth/Throat: No oropharyngeal exudate.  Eyes: Pupils are equal, round, and reactive to light. Conjunctivae and EOM are normal.  Neck: Normal range of motion.  Cardiovascular: Normal heart sounds.  Irregular rhythm  Respiratory: Effort normal and breath sounds normal. No respiratory distress.  GI:  There is severe diffuse tenderness with guarding  Musculoskeletal: Normal range of motion.        General: No tenderness or edema.  Neurological: He is alert and oriented to person, place, and time. Coordination normal.  Skin: Skin is warm and dry. No rash noted.  Psychiatric: He has a normal mood and affect. His behavior is normal. Thought content normal.    Assessment/Plan: The patient appears to have a perforated sigmoid diverticulitis.  Because of the risk of sepsis I think he would benefit from having exploratory surgery and removal of the sigmoid colon with a colostomy.  Given the amount of abdominal pain he has and the fact that he is on immunotherapy agents I think he would fail medical management.  I have discussed with him in detail the risks and benefits of the operation as well as some of the technical aspects and he understands and wishes to proceed.  We will give the Eliquis reversing medicine.  I will plan for surgery this evening.  [name] 06/15/2018, 5:15 PM

## 2018-06-15 NOTE — Op Note (Signed)
06/15/2018  8:22 PM  PATIENT:  [name]  73 y.o. male  PRE-OPERATIVE DIAGNOSIS:  Perforated sigmoid colon  POST-OPERATIVE DIAGNOSIS:  Perforated sigmoid colon  PROCEDURE:  Procedure(s): EXPLORATORY LAPAROTOMY WITH SIGMOID COLON RESECTION AND END COLOSTOMY([facility])  SURGEON:  Surgeon(s) and Role:    * [name], MD - Primary    * Georganna Skeans, MD - Assisting  PHYSICIAN ASSISTANT:   ASSISTANTS: Dr. Grandville Silos   ANESTHESIA:   general  EBL:  250 mL   BLOOD ADMINISTERED:none  DRAINS: none   LOCAL MEDICATIONS USED:  NONE  SPECIMEN:  Source of Specimen:  sigmoid colon  DISPOSITION OF SPECIMEN:  PATHOLOGY  COUNTS:  YES  TOURNIQUET:  * No tourniquets in log *  DICTATION: .Dragon Dictation   After informed consent was obtained the patient was brought to the operating room and placed in the supine position on the operating table.  After adequate induction of general anesthesia the patient's abdomen was prepped with ChloraPrep, allowed to dry, and draped in usual sterile manner.  An appropriate timeout was performed.  A midline incision was made with a 10 blade knife.  The incision was carried through the skin and subcutaneous tissue sharply with electrocautery until the linea alba was identified.  The linea alba was incised with the electrocautery.  The preperitoneal space was probed bluntly with a hemostat until the peritoneum was opened and access was gained to the abdominal cavity.  The rest of the incision was opened under direct vision with the electrocautery.  A large amount of pus was identified in the abdominal cavity.  This was cultured.  The pus was evacuated.  On general inspection of the abdomen the main identifiable problem was the sigmoid colon with a perforation.  A site was chosen above and below the area of perforation where the colon appeared healthy.  The mesentery at each of these points was opened bluntly with finger dissection.  A green load contour  stapler was placed across the colon at each of these points, clamped, and fired thereby dividing the colon between staple lines.  The mesentery to the sigmoid colon was then taken down sharply with the LigaSure.  Several bleeding points along the mesentery were controlled with 2-0 silk figure-of-eight stitches.  A stitch was used to mark the distal staple line of the sigmoid colon and it was sent to pathology.  The left colon was then mobilized by incising its retroperitoneal attachment along the white line of Toldt.  Once there was enough mobilization that it looked like the colon would be able to reach out through the abdominal wall then the abdomen was irrigated with copious amounts of saline.  A site was chosen for bringing out the ostomy.  A circle of skin and subcutaneous tissue was removed sharply with the electrocautery.  The mesentery was opened in a cruciate fashion so that 3 fingers could go through the opening.  A Babcock was placed through this opening and used to bring the proximal and of the colon through the abdominal wall.  The colon seemed to reach easily.  Next the fascia of the anterior abdominal wall was closed with 2 running #1 double-stranded looped PDS sutures.  The wound was then covered with a sterile towel.  The staple line of the ostomy was then removed sharply with the electrocautery.  The ostomy was matured with interrupted 3-0 Vicryl stitches.  The ostomy appeared pink and healthy.  The midline wound was then packed with Kerlix gauze  and an ostomy appliance was applied.  Sterile dressings were applied.  The patient tolerated the procedure well.  At the end of the case all needle sponge and instrument counts were correct.  The patient was then awakened and taken to recovery in stable condition.  PLAN OF CARE: Admit to inpatient   PATIENT DISPOSITION:  ICU - extubated and stable.   Delay start of Pharmacological VTE agent (>24hrs) due to surgical blood loss or risk of bleeding:  no

## 2018-06-15 NOTE — ED Notes (Signed)
Pt with sudden onset of several abd pain at 1030 this am. No relief as of yet excluding about 5 min. Pt moaning and groaning, pale in color.

## 2018-06-15 NOTE — ED Notes (Signed)
Pt aware we need urine specimen he's attempted twice wasn't able

## 2018-06-15 NOTE — ED Triage Notes (Signed)
Pt c/o severe lower abdominal pain radiating across R and L lower abdomen, pt with pain in testicles. Pt states last BM was yesterday and normal. Denies NVD. Pt increases with movement.

## 2018-06-15 NOTE — Transfer of Care (Signed)
Immediate Anesthesia Transfer of Care Note  Patient: [name]  Procedure(s) Performed: EXPLORATORY LAPAROTOMY (N/A ) SIGMOID COLON RESECTION (N/A ) COLOSTOMY (N/A )  Patient Location: PACU  Anesthesia Type:General  Level of Consciousness: confused  Airway & Oxygen Therapy: Patient Spontanous Breathing and Patient connected to face mask oxygen  Post-op Assessment: Report given to RN and Post -op Vital signs reviewed and stable  Post vital signs: Reviewed and stable  Last Vitals:  Vitals Value Taken Time  BP    Temp    Pulse    Resp    SpO2      Last Pain:  Vitals:   06/15/18 1641  TempSrc:   PainSc: 3          Complications: No apparent anesthesia complications

## 2018-06-15 NOTE — ED Notes (Signed)
Bolus started by Micheal from OR.

## 2018-06-15 NOTE — Anesthesia Postprocedure Evaluation (Signed)
Anesthesia Post Note  Patient: [name]  Procedure(s) Performed: EXPLORATORY LAPAROTOMY (N/A ) SIGMOID COLON RESECTION (N/A ) COLOSTOMY (N/A )     Patient location during evaluation: PACU Anesthesia Type: General Level of consciousness: sedated, patient cooperative and oriented Pain management: pain level controlled (pt able to doze) Vital Signs Assessment: post-procedure vital signs reviewed and stable Respiratory status: spontaneous breathing, nonlabored ventilation, respiratory function stable and patient connected to nasal cannula oxygen Cardiovascular status: blood pressure returned to baseline and stable Postop Assessment: no apparent nausea or vomiting Anesthetic complications: no    Last Vitals:  Vitals:   06/15/18 1700 06/15/18 2046  BP: 132/81 130/76  Pulse: (!) 117 97  Resp: (!) 22 (!) 21  Temp:  36.7 C  SpO2: 93% 100%    Last Pain:  Vitals:   06/15/18 2046  TempSrc:   PainSc: Asleep                 Venita Seng,E. Bertie Simien

## 2018-06-15 NOTE — H&P (Signed)
History and Physical  [name] PRF:[id number] DOB: 03/29/1946 DOA: 06/15/2018  PCP: [name], MD Patient coming from: Home  I have personally briefly reviewed patient's old medical records in [place]   Chief Complaint: Severe abdominal pain  HPI: [name] is a 73 y.o. male with past medical history significant for interstitial lung disease on prednisone, CellCept, and nintedanib, A. 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  He reports pain is sharp, 10 out of 10 in intensity, left lower quadrant.  The pain gets worse with movement.  He relates that 2 days prior to admission he was constipated he tubes Dulcolax and a suppository with resolution of constipation.  He noticed mild abdominal discomfort for the day prior to admission.  He denies nausea, vomiting, fever chest pain.  He does report shortness of breath chronic in nature secondary to his interstitial lung disease.   Evaluation in the ED: 36, potassium 3.9, BUN 13, creatinine 1.0, albumin 3.0, lipase 29, AST 48, ALT 108 bilirubin 1.6.  Blood cell 9.6, hemoglobin 16, platelet 186. -CT  abdomen and pelvis:Proximal sigmoid diverticulitis is noted with a large amount of free air in the adjacent peritoneal fat and extending in the epigastric region consistent with perforation. 5.2 x 4.3 cm fluid collection with air-fluid level is noted in this area concerning for contained perforation or developing abscess. Mild small bowel dilatation is noted which may represent ileus or possibly obstruction  Review of Systems: All systems reviewed and apart from history of presenting illness, are negative.  Past Medical History:  Diagnosis Date  . Arthritis   . Chest pain    a. 03/2017: echo showing EF of 60-65%, no regional WMA or significant valve abnormalities. b. 03/2016: NST with no evidence of ischemia.   .  Digestive problems    on Prednisone prn for this issue- diarrhea  . Dyspnea    with activity  . Dysrhythmia    irregular due to Myocarditis- takes Atenolol, Norvasc  . GERD (gastroesophageal reflux disease)   . H/O viral myocarditis    25 years ago  . Head injury, closed, with concussion    [place] LOC  . Hyperlipidemia   . Mild mitral valve prolapse    per Dr Evette Georges notes  . PAF (paroxysmal atrial fibrillation) ([place])    a. initially occuring in 02/2016. b. recurrent in 07/2016. Placed on Eliquis  . Polymyalgia rheumatica ([place])   . Pre-diabetes   . Sleep apnea    mild sleep apnea   no CPAP   Past Surgical History:  Procedure Laterality Date  . apendectomy  1965  . APPENDECTOMY    . bone spur Bilateral 1999   feet  . CARDIAC CATHETERIZATION  2001  . CHOLECYSTECTOMY  2004  . COLONOSCOPY    . EYE SURGERY Bilateral    cataract removal  . LUMBAR LAMINECTOMY/ DECOMPRESSION WITH MET-RX Right 05/05/2013   Procedure: Right Lumbar three-four Extraforaminal Microdiskectomy with Metrex;  Surgeon: [name], MD;  Location: [facility];  Service: Neurosurgery;  Laterality: Right;  Right Lumbar three-four Extraforaminal Microdiskectomy with Metrex  . LUNG BIOPSY Right 02/19/2018   Procedure: LUNG BIOPSY;  Surgeon: [name], MD;  Location: [facility];  Service: Thoracic;  Laterality: Right;  . SHOULDER OPEN ROTATOR CUFF REPAIR Bilateral 2001  . TONSILLECTOMY    . VIDEO ASSISTED THORACOSCOPY Right 02/19/2018  Procedure: VIDEO ASSISTED THORACOSCOPY;  Surgeon: [name], MD;  Location: [facility];  Service: Thoracic;  Laterality: Right;   Social History:  reports that he has never smoked. He has never used smokeless tobacco. He reports current alcohol use of about 21.0 standard drinks of alcohol per week. He reports that he does not use drugs.   Allergies  Allergen Reactions  . Lidocaine Hcl Anaphylaxis and Other (See Comments)    Xylocaine  . Xylocaine [Lidocaine]  Anaphylaxis  . Codeine Nausea And Vomiting  . Crestor [Rosuvastatin Calcium] Other (See Comments)    All-over body aches Myalgias   . Statins Other (See Comments)    Muscle soreness and an aching feeling all over Myalgias  . Percocet [Oxycodone-Acetaminophen] Itching    Family History  Problem Relation Age of Onset  . Rheum arthritis Mother   . Thyroid cancer Mother   . Heart disease Father   . Asthma Father   . Thyroid cancer Sister   . Melanoma Brother     Prior to Admission medications   Medication Sig Start Date End Date Taking? Authorizing Provider  acetaminophen (TYLENOL) 500 MG tablet Take 1,000 mg by mouth every 6 (six) hours as needed for mild pain or headache.    Yes [provider]  albuterol (PROVENTIL HFA;VENTOLIN HFA) 108 (90 Base) MCG/ACT inhaler Inhale 2 puffs into the lungs every 4 (four) hours as needed for wheezing or shortness of breath.  12/02/17  Yes [provider]  budesonide-formoterol (SYMBICORT) 80-4.5 MCG/ACT inhaler Inhale 2 puffs into the lungs 2 (two) times daily. 03/19/18  Yes McQuaid, Ronie Spies, MD  CARTIA XT 240 MG 24 hr capsule TAKE ONE CAPSULE BY MOUTH DAILY Patient taking differently: Take 240 mg by mouth daily.  12/23/17  Yes Troy Sine, MD  ELIQUIS 5 MG TABS tablet TAKE ONE TABLET (5MG TOTAL) BY MOUTH TWODAILY Patient taking differently: Take 5 mg by mouth 2 (two) times daily.  01/28/18  Yes Troy Sine, MD  ezetimibe (ZETIA) 10 MG tablet TAKE ONE (1) TABLET BY MOUTH EVERY DAY Patient taking differently: Take 10 mg by mouth daily.  03/05/18  Yes Troy Sine, MD  gabapentin (NEURONTIN) 300 MG capsule Take 300 mg by mouth at bedtime.  06/11/18  Yes [provider]  Methylsulfonylmethane (MSM) 1000 MG CAPS Take 1,000 mg by mouth daily.    Yes [provider]  metoprolol tartrate (LOPRESSOR) 25 MG tablet TAKE ONE TABLET BY MOUTH TWICE A DAY 02/25/18  Yes Troy Sine, MD  mycophenolate (CELLCEPT) 500  MG tablet 1 tab bid X7 days, then titrate to 2 tabs bid maintenance dose 05/30/18  Yes McQuaid, Ronie Spies, MD  Nintedanib (OFEV) 100 MG CAPS Take 1 capsule (100 mg total) by mouth 2 (two) times daily. 04/14/18  Yes Juanito Doom, MD  Omega-3 Fatty Acids (FISH OIL) 500 MG CAPS Take 1 capsule by mouth 2 (two) times daily.   Yes [provider]  predniSONE (DELTASONE) 10 MG tablet Take 1 tablet (10 mg total) by mouth daily with breakfast. 05/30/18  Yes Juanito Doom, MD  tamsulosin (FLOMAX) 0.4 MG CAPS capsule Take 0.4 mg by mouth 2 (two) times daily.    Yes [provider]  traMADol (ULTRAM) 50 MG tablet Take 50 mg by mouth every 6 (six) hours as needed for moderate pain or severe pain.  06/11/18  Yes [provider]  cetirizine (ZYRTEC) 10 MG tablet Take 10 mg by mouth  daily as needed for allergies.     [provider]  diphenoxylate-atropine (LOMOTIL) 2.5-0.025 MG tablet Take 1 tablet by mouth 4 (four) times daily as needed for diarrhea or loose stools.     [provider]  fluticasone (FLONASE) 50 MCG/ACT nasal spray Place 2 sprays into both nostrils daily as needed for allergies or rhinitis.    [provider]  Neomycin-Bacitracin-Polymyxin (TRIPLE ANTIBIOTIC) [phone] OINT Apply 1 application topically daily.    [provider]  Omega-3 Fatty Acids (FISH OIL) 1000 MG CAPS take 2 capsules daily Patient not taking: Reported on 06/15/2018 08/30/16   Troy Sine, MD  ondansetron (ZOFRAN) 4 MG tablet Take 4 mg by mouth every 8 (eight) hours as needed for nausea or vomiting.    [provider]  Spacer/Aero-Holding Chambers (AEROCHAMBER MV) inhaler Use as instructed 01/10/18   Juanito Doom, MD  sulfamethoxazole-trimethoprim (BACTRIM DS,SEPTRA DS) 800-160 MG tablet Take 1 tablet by mouth 2 (two) times daily. Patient taking differently: Take 1 tablet by mouth 2 (two) times daily. Take on MWF for 18 months 06/11/18    Juanito Doom, MD   Physical Exam: Vitals:   06/15/18 1330 06/15/18 1413 06/15/18 1630  BP: (!) 144/86 (!) 147/88 136/85  Pulse: (!) 117 (!) 114 (!) 120  Resp: _0 Temp: 98.6 F (37 C)    TempSrc: Oral    SpO2: 97% 95% 93%     General exam: Moderately built and nourished patient, lying comfortably supine on the gurney in no obvious distress.  Head, eyes and ENT: Nontraumatic and normocephalic. Pupils equally reacting to light and accommodation. Oral mucosa moist.  Neck: Supple. No JVD, carotid bruit or thyromegaly.  Lymphatics: No lymphadenopathy.  Respiratory system: Clear to auscultation. No increased work of breathing.  Cardiovascular system: S1 and S2 heard, RRR. No JVD, murmurs, gallops, clicks or pedal edema.  Gastrointestinal system: Abdomen is distended, very tender to palpation, no rigidity, positive guarding.  Central nervous system: Alert and oriented. No focal neurological deficits.  Extremities: Symmetric 5 x 5 power. Peripheral pulses symmetrically felt.   Skin: No rashes or acute findings.  Musculoskeletal system: Negative exam.  Psychiatry: Pleasant and cooperative.   Labs on Admission:  Basic Metabolic Panel: Recent Labs  Lab 06/15/18 1355  NA 136  K 3.9  CL 102  CO2 24  GLUCOSE 138*  BUN 13  CREATININE 1.01  CALCIUM 8.4*   Liver Function Tests: Recent Labs  Lab 06/15/18 1355  AST 48*  ALT 108*  ALKPHOS 124  BILITOT 1.6*  PROT 6.5  ALBUMIN 3.0*   Recent Labs  Lab 06/15/18 1355  LIPASE 29   No results for input(s): AMMONIA in the last 168 hours. CBC: Recent Labs  Lab 06/15/18 1355  WBC 9.6  HGB 16.0  HCT 46.4  MCV 105.9*  PLT 186   Cardiac Enzymes: No results for input(s): CKTOTAL, CKMB, CKMBINDEX, TROPONINI in the last 168 hours.  BNP (last 3 results) No results for input(s): PROBNP in the last 8760 hours. CBG: No results for input(s): GLUCAP in the last 168 hours.  Radiological Exams on  Admission: Ct Abdomen Pelvis W Contrast  Result Date: 06/15/2018 CLINICAL DATA:  Acute generalized abdominal pain. EXAM: CT ABDOMEN AND PELVIS WITH CONTRAST TECHNIQUE: Multidetector CT imaging of the abdomen and pelvis was performed using the standard protocol following bolus administration of intravenous contrast. CONTRAST:  154m ISOVUE-300 IOPAMIDOL (ISOVUE-300) INJECTION 61% COMPARISON:  CT scan of Sep 22, 2012. FINDINGS: Lower chest: No acute abnormality. Hepatobiliary: No focal liver abnormality is seen. Status post cholecystectomy. No biliary dilatation. Pancreas: Unremarkable. No pancreatic ductal dilatation or surrounding inflammatory changes. Spleen: Normal in size without focal abnormality. Adrenals/Urinary Tract: Adrenal glands are unremarkable. Kidneys are normal, without renal calculi, focal lesion, or hydronephrosis. Bladder is unremarkable. Stomach/Bowel: The stomach appears normal. Status post appendectomy. Proximal sigmoid diverticulitis is noted with a large amount of free air in the adjacent peritoneal fat and extending into the epigastric region consistent with perforation. 5.2 x 4.3 cm fluid collection with air-fluid level is noted in this area concerning for contained perforation or developing abscess. Mild small bowel dilatation is noted which may represent ileus or possibly obstruction. Vascular/Lymphatic: No significant vascular findings are present. No enlarged abdominal or pelvic lymph nodes. Reproductive: Prostate is unremarkable. Other: Mild fat containing left inguinal hernia is noted. Musculoskeletal: Status post surgical posterior fusion of L2-3, L3-4 and L4-5. No acute osseous abnormality is noted. IMPRESSION: Proximal sigmoid diverticulitis is noted with a large amount of free air in the adjacent peritoneal fat and extending in the epigastric region consistent with perforation. 5.2 x 4.3 cm fluid collection with air-fluid level is noted in this area concerning for contained  perforation or developing abscess. Mild small bowel dilatation is noted which may represent ileus or possibly obstruction. Critical Value/emergent results were called by telephone at the time of interpretation on 06/15/2018 at 3:13 pm to Dr. Reece Agar , who verbally acknowledged these results. Electronically Signed   By: Marijo Conception, M.D.   On: 06/15/2018 15:14    EKG: Ordered  Assessment/Plan Principal Problem:   Diverticulitis of intestine with perforation and abscess Active Problems:   Abnormal LFTs   Other hypertrophic cardiomyopathy (HCC)   Essential hypertension   ILD (interstitial lung disease) (HCC)   PAF (paroxysmal atrial fibrillation) (HCC)   OSA (obstructive sleep apnea)   Diverticulitis of intestine with perforation   Diverticulitis of intestine with perforation and abscess without bleeding  1-Acute sigmoid diverticulitis with perforation and contained fluid collection; Admit to the stepdown unit. N.p.o. Discussed IV antibiotics with general surgery, continue with IV Flagyl, IV Zosyn. IV Protonix GI prophylaxis. Hold Eliquis. IV fluids. Dr Marlou Starks is recommending surgery tonight. He will give reversal agent to patient for eliquis.   2-A. fib RVR: Patient took Cardizem today.  I will start IV metoprolol 5 mg every 8 hours.  If heart rate is not well controlled will need to start Cardizem drip. Hold Eliquis.  Discussed with Dr. Marlou Starks, hold starting any anticoagulation for now. Will need to follow surgery recommendation regarding anticoagulation.Marland Kitchen  3-Interstitial lung disease; Hold CellCept,Nintedanib.  He is on chronic prednisone.  He has been on prednisone for many years.  I will do low-dose Solu-Medrol to avoid adrenal insufficiency. Continue with Symbicort, nebulizer treatment.  4-Hypertension: IV metoprolol.   5-ileus versus SBO by CT scan: N.p.o. Surgery following.  6-Transaminases hyperbilirubinemia;  Setting of acute infection.  Follow trend. Check  viral hepatitis panel.  Could be related to Nintedanib.   DVT Prophylaxis: SCDs, in anticipation for surgery. Code Status: Full code Family Communication: Wife at bedside. Disposition Plan: Admit to the stepdown unit inpatient.  Time spent: 75 minutes.     It is my clinical opinion that admission to INPATIENT is reasonable and necessary in this 73 y.o. male . presenting with symptoms of severe abdominal pain, concerning for acute diverticulitis with perforation . in the context of PMH including: Prior history  of immunosuppression; patient on prednisone, CellCept, Nintedanib.  . with pertinent positives on physical exam including: Abdominal pain tenderness to palpation, tachycardia . and pertinent positives on radiographic and laboratory data including: CT abdomen showing diverticulitis with perforation, fluid collection . Workup and treatment include CT abdomen, CBC, treatment.  IV fluids, IV antibiotics, IV metoprolol..   Given the aforementioned, the predictability of an adverse outcome is felt to be significant. I expect that the patient will require at least 2 midnights in the hospital to treat this condition.  Elmarie Shiley MD [facility]   06/15/2018, 5:06 PM

## 2018-06-15 NOTE — ED Provider Notes (Addendum)
[facility] DEPT Provider Note   CSN: [id number] Arrival date & time: 06/15/18  1317     History   Chief Complaint Chief Complaint  Patient presents with  . Abdominal Pain    HPI [name] is a 73 y.o. male with a PMHx of idiopathic pulmonary fibrosis on cellcept, GERD, HLD, PAF on eliquis, chronic diarrhea, HTN, PMR, and other conditions listed below, who presents to the ED with complaints of sudden onset lower abdominal pain that began at 10 AM.  He describes the pain as 10/10 constant sharp lower abdominal pain that initially was radiating into his testicles but has stopped radiating anywhere, worse with movement, and unrelieved with home tramadol.  He reports associated bloating/distention.  He states that a few days ago he was constipated but he is no longer constipated anymore, his last bowel movement was yesterday.  He tried using a glycerin suppository and Dulcolax this morning because he thought he needed to have a bowel movement, but nothing happened.  He endorses alcohol use stating that he has a couple glasses of wine per night, last use was last night.  His PCP is Dr. Allyn Kenner in [place].  He denies any fevers, chills, chest pain, shortness of breath, nausea, vomiting, diarrhea, constipation, obstipation, melena, hematochezia, dysuria, hematuria, testicular pain or swelling, penile discharge, numbness, tingling, focal weakness, or any other complaints at this time.  He denies any recent travel, sick contacts, suspicious food intake, or frequent NSAID use.  The history is provided by the patient and medical records. No language interpreter was used.  Abdominal Pain  Associated symptoms: no chest pain, no chills, no constipation, no diarrhea, no dysuria, no fever, no hematuria, no nausea, no shortness of breath and no vomiting     Past Medical History:  Diagnosis Date  . Arthritis   . Chest pain    a. 03/2017: echo showing EF of 60-65%,  no regional WMA or significant valve abnormalities. b. 03/2016: NST with no evidence of ischemia.   . Digestive problems    on Prednisone prn for this issue- diarrhea  . Dyspnea    with activity  . Dysrhythmia    irregular due to Myocarditis- takes Atenolol, Norvasc  . GERD (gastroesophageal reflux disease)   . H/O viral myocarditis    25 years ago  . Head injury, closed, with concussion    Breif LOC  . Hyperlipidemia   . Mild mitral valve prolapse    per Dr Evette Georges notes  . PAF (paroxysmal atrial fibrillation) ([place])    a. initially occuring in 02/2016. b. recurrent in 07/2016. Placed on Eliquis  . Polymyalgia rheumatica ([place])   . Pre-diabetes   . Sleep apnea    mild sleep apnea   no CPAP    Patient Active Problem List   Diagnosis Date Noted  . OSA (obstructive sleep apnea) 12/13/2016  . Snoring 10/26/2016  . PAF (paroxysmal atrial fibrillation) ([place]) 08/14/2016  . ILD (interstitial lung disease) ([place]) 05/01/2016  . Lumbar stenosis 02/01/2015  . Giardia 04/14/2014  . H/O viral cardiomyopathy 04/14/2014  . Herniated nucleus pulposus, L3-4 right 05/05/2013  . Other hypertrophic cardiomyopathy ([place]) 04/19/2013  . Hyperlipidemia 04/19/2013  . Essential hypertension 04/19/2013  . Abnormal LFTs 09/16/2012  . Nausea 07/10/2011  . Chronic diarrhea 07/10/2011  . Burping 07/10/2011  . Osteoporosis 07/10/2011  . Arthritis 07/10/2011    Past Surgical History:  Procedure Laterality Date  . apendectomy  1965  . APPENDECTOMY    .  bone spur Bilateral 1999   feet  . CARDIAC CATHETERIZATION  2001  . CHOLECYSTECTOMY  2004  . COLONOSCOPY    . EYE SURGERY Bilateral    cataract removal  . LUMBAR LAMINECTOMY/ DECOMPRESSION WITH MET-RX Right 05/05/2013   Procedure: Right Lumbar three-four Extraforaminal Microdiskectomy with Metrex;  Surgeon: [name], MD;  Location: [facility];  Service: Neurosurgery;  Laterality: Right;  Right Lumbar three-four Extraforaminal Microdiskectomy  with Metrex  . LUNG BIOPSY Right 02/19/2018   Procedure: LUNG BIOPSY;  Surgeon: [name], MD;  Location: [facility];  Service: Thoracic;  Laterality: Right;  . SHOULDER OPEN ROTATOR CUFF REPAIR Bilateral 2001  . TONSILLECTOMY    . VIDEO ASSISTED THORACOSCOPY Right 02/19/2018   Procedure: VIDEO ASSISTED THORACOSCOPY;  Surgeon: [name], MD;  Location: [facility] OR;  Service: Thoracic;  Laterality: Right;        Home Medications    Prior to Admission medications   Medication Sig Start Date End Date Taking? Authorizing Provider  acetaminophen (TYLENOL) 500 MG tablet Take 1,000 mg by mouth every 6 (six) hours as needed for mild pain or headache.     [provider]  albuterol (PROVENTIL HFA;VENTOLIN HFA) 108 (90 Base) MCG/ACT inhaler Inhale 2 puffs into the lungs every 4 (four) hours as needed for wheezing or shortness of breath.  12/02/17   [provider]  budesonide-formoterol (SYMBICORT) 80-4.5 MCG/ACT inhaler Inhale 2 puffs into the lungs 2 (two) times daily. 03/19/18   Juanito Doom, MD  CARTIA XT 240 MG 24 hr capsule TAKE ONE CAPSULE BY MOUTH DAILY Patient taking differently: Take 240 mg by mouth daily.  12/23/17   Troy Sine, MD  cetirizine (ZYRTEC) 10 MG tablet Take 10 mg by mouth daily as needed for allergies.     [provider]  diphenoxylate-atropine (LOMOTIL) 2.5-0.025 MG tablet Take 1 tablet by mouth 4 (four) times daily as needed for diarrhea or loose stools.     [provider]  ELIQUIS 5 MG TABS tablet TAKE ONE TABLET (5MG TOTAL) BY MOUTH TWODAILY 01/28/18   Troy Sine, MD  ezetimibe (ZETIA) 10 MG tablet TAKE ONE (1) TABLET BY MOUTH EVERY DAY 03/05/18   Troy Sine, MD  fluticasone (FLONASE) 50 MCG/ACT nasal spray Place 2 sprays into both nostrils daily as needed for allergies or rhinitis.    [provider]  Methylsulfonylmethane (MSM) 1000 MG CAPS Take 1,000 mg by mouth daily.     [provider]  metoprolol tartrate (LOPRESSOR) 25 MG tablet TAKE ONE TABLET BY MOUTH TWICE A DAY 02/25/18   Troy Sine, MD  mycophenolate (CELLCEPT) 500 MG tablet 1 tab bid X7 days, then titrate to 2 tabs bid maintenance dose 05/30/18   Juanito Doom, MD  Neomycin-Bacitracin-Polymyxin (TRIPLE ANTIBIOTIC) [phone] OINT Apply 1 application topically daily.    [provider]  Nintedanib (OFEV) 100 MG CAPS Take 1 capsule (100 mg total) by mouth 2 (two) times daily. 04/14/18   Juanito Doom, MD  Omega-3 Fatty Acids (FISH OIL) 1000 MG CAPS take 2 capsules daily Patient taking differently: Take 500 mg by mouth 2 (two) times daily.  08/30/16   Troy Sine, MD  ondansetron (ZOFRAN) 4 MG tablet Take 4 mg by mouth every 8 (eight) hours as needed for nausea or vomiting.    [provider]  predniSONE (DELTASONE) 10 MG tablet Take 1 tablet (10 mg total) by mouth daily with breakfast. 05/30/18  Juanito Doom, MD  Spacer/Aero-Holding Chambers (AEROCHAMBER MV) inhaler Use as instructed 01/10/18   Juanito Doom, MD  sulfamethoxazole-trimethoprim (BACTRIM DS,SEPTRA DS) 800-160 MG tablet Take 1 tablet by mouth 2 (two) times daily. 06/11/18   Juanito Doom, MD  tamsulosin (FLOMAX) 0.4 MG CAPS capsule Take 0.4 mg by mouth 2 (two) times daily.     [provider]    Family History Family History  Problem Relation Age of Onset  . Rheum arthritis Mother   . Thyroid cancer Mother   . Heart disease Father   . Asthma Father   . Thyroid cancer Sister   . Melanoma Brother     Social History Social History   Tobacco Use  . Smoking status: Never Smoker  . Smokeless tobacco: Never Used  Substance Use Topics  . Alcohol use: Yes    Alcohol/week: 21.0 standard drinks    Types: 21 Glasses of wine per week    Comment: 2/3 glasses wine in evening  . Drug use: No     Allergies   Lidocaine hcl; Xylocaine [lidocaine]; Codeine; Crestor [rosuvastatin calcium]; Statins;  and Percocet [oxycodone-acetaminophen]   Review of Systems Review of Systems  Constitutional: Negative for chills and fever.  Respiratory: Negative for shortness of breath.   Cardiovascular: Negative for chest pain.  Gastrointestinal: Positive for abdominal distention and abdominal pain. Negative for blood in stool, constipation, diarrhea, nausea and vomiting.  Genitourinary: Positive for testicular pain (initially but now resolved). Negative for discharge, dysuria, hematuria and scrotal swelling.  Musculoskeletal: Negative for arthralgias and myalgias.  Skin: Negative for color change.  Allergic/Immunologic: Positive for immunocompromised state (on cellcept).  Neurological: Negative for weakness and numbness.  Psychiatric/Behavioral: Negative for confusion.   All other systems reviewed and are negative for acute change except as noted in the HPI.    Physical Exam Updated Vital Signs BP (!) 144/86 (BP Location: Left Arm)   Pulse (!) 117   Temp 98.6 F (37 C) (Oral)   Resp 18   SpO2 97%   Physical Exam Vitals signs and nursing note reviewed.  Constitutional:      General: He is in acute distress.     Appearance: Normal appearance. He is well-developed. He is not toxic-appearing.     Comments: Afebrile, nontoxic, appears uncomfortable  HENT:     Head: Normocephalic and atraumatic.  Eyes:     General:        Right eye: No discharge.        Left eye: No discharge.     Conjunctiva/sclera: Conjunctivae normal.  Neck:     Musculoskeletal: Normal range of motion and neck supple.  Cardiovascular:     Rate and Rhythm: Regular rhythm. Tachycardia present.     Pulses: Normal pulses.     Heart sounds: Normal heart sounds, S1 normal and S2 normal. No murmur. No friction rub. No gallop.   Pulmonary:     Effort: Pulmonary effort is normal. No respiratory distress.     Breath sounds: Normal breath sounds. No decreased breath sounds, wheezing, rhonchi or rales.  Abdominal:      General: Bowel sounds are decreased. There is distension.     Palpations: Abdomen is soft. Abdomen is not rigid.     Tenderness: There is generalized abdominal tenderness. There is guarding. There is no right CVA tenderness, left CVA tenderness or rebound. Negative signs include Murphy's sign and McBurney's sign.     Comments: Soft but mildly distended, hypoactive BS throughout,  with diffuse generalized abd TTP worse in the lower abdomen, with guarding, no rigidity or rebound, neg murphy's, neg mcburney's, no CVA TTP   Musculoskeletal: Normal range of motion.  Skin:    General: Skin is warm and dry.     Findings: No rash.  Neurological:     Mental Status: He is alert and oriented to person, place, and time.     Sensory: Sensation is intact. No sensory deficit.     Motor: Motor function is intact.  Psychiatric:        Mood and Affect: Mood and affect normal.        Behavior: Behavior normal.      ED Treatments / Results  Labs (all labs ordered are listed, but only abnormal results are displayed) Labs Reviewed  COMPREHENSIVE METABOLIC PANEL - Abnormal; Notable for the following components:      Result Value   Glucose, Bld 138 (*)    Calcium 8.4 (*)    Albumin 3.0 (*)    AST 48 (*)    ALT 108 (*)    Total Bilirubin 1.6 (*)    All other components within normal limits  CBC - Abnormal; Notable for the following components:   MCV 105.9 (*)    MCH 36.5 (*)    All other components within normal limits  LIPASE, BLOOD  URINALYSIS, ROUTINE W REFLEX MICROSCOPIC    EKG None  Radiology Ct Abdomen Pelvis W Contrast  Result Date: 06/15/2018 CLINICAL DATA:  Acute generalized abdominal pain. EXAM: CT ABDOMEN AND PELVIS WITH CONTRAST TECHNIQUE: Multidetector CT imaging of the abdomen and pelvis was performed using the standard protocol following bolus administration of intravenous contrast. CONTRAST:  14m ISOVUE-300 IOPAMIDOL (ISOVUE-300) INJECTION 61% COMPARISON:  CT scan of Sep 22, 2012. FINDINGS: Lower chest: No acute abnormality. Hepatobiliary: No focal liver abnormality is seen. Status post cholecystectomy. No biliary dilatation. Pancreas: Unremarkable. No pancreatic ductal dilatation or surrounding inflammatory changes. Spleen: Normal in size without focal abnormality. Adrenals/Urinary Tract: Adrenal glands are unremarkable. Kidneys are normal, without renal calculi, focal lesion, or hydronephrosis. Bladder is unremarkable. Stomach/Bowel: The stomach appears normal. Status post appendectomy. Proximal sigmoid diverticulitis is noted with a large amount of free air in the adjacent peritoneal fat and extending into the epigastric region consistent with perforation. 5.2 x 4.3 cm fluid collection with air-fluid level is noted in this area concerning for contained perforation or developing abscess. Mild small bowel dilatation is noted which may represent ileus or possibly obstruction. Vascular/Lymphatic: No significant vascular findings are present. No enlarged abdominal or pelvic lymph nodes. Reproductive: Prostate is unremarkable. Other: Mild fat containing left inguinal hernia is noted. Musculoskeletal: Status post surgical posterior fusion of L2-3, L3-4 and L4-5. No acute osseous abnormality is noted. IMPRESSION: Proximal sigmoid diverticulitis is noted with a large amount of free air in the adjacent peritoneal fat and extending in the epigastric region consistent with perforation. 5.2 x 4.3 cm fluid collection with air-fluid level is noted in this area concerning for contained perforation or developing abscess. Mild small bowel dilatation is noted which may represent ileus or possibly obstruction. Critical Value/emergent results were called by telephone at the time of interpretation on 06/15/2018 at 3:13 pm to Dr. MReece Agar, who verbally acknowledged these results. Electronically Signed   By: JMarijo Conception M.D.   On: 06/15/2018 15:14    Procedures Procedures (including critical  care time)  CRITICAL CARE Performed by: MReece Agar  Total critical care time: 456  minutes  Critical care time was exclusive of separately billable procedures and treating other patients.  Critical care was necessary to treat or prevent imminent or life-threatening deterioration.  Critical care was time spent personally by me on the following activities: development of treatment plan with patient and/or surrogate as well as nursing, discussions with consultants, evaluation of patient's response to treatment, examination of patient, obtaining history from patient or surrogate, ordering and performing treatments and interventions, ordering and review of laboratory studies, ordering and review of radiographic studies, pulse oximetry and re-evaluation of patient's condition.   Medications Ordered in ED Medications  iopamidol (ISOVUE-300) 61 % injection (has no administration in time range)  sodium chloride (PF) 0.9 % injection (has no administration in time range)  ceFEPIme (MAXIPIME) 2 g in sodium chloride 0.9 % 100 mL IVPB (2 g Intravenous New Bag/Given 06/15/18 1452)    And  metroNIDAZOLE (FLAGYL) IVPB 500 mg (500 mg Intravenous New Bag/Given 06/15/18 1537)  HYDROmorphone (DILAUDID) injection 1 mg (has no administration in time range)  sodium chloride flush (NS) 0.9 % injection 3 mL (3 mLs Intravenous Given 06/15/18 1410)  morphine 4 MG/ML injection 4 mg (4 mg Intravenous Given 06/15/18 1420)  ondansetron (ZOFRAN) injection 4 mg (4 mg Intravenous Given 06/15/18 1419)  sodium chloride 0.9 % bolus 1,000 mL (1,000 mLs Intravenous New Bag/Given 06/15/18 1431)  iopamidol (ISOVUE-300) 61 % injection 100 mL (100 mLs Intravenous Contrast Given 06/15/18 1434)     Initial Impression / Assessment and Plan / ED Course  I have reviewed the triage vital signs and the nursing notes.  Pertinent labs & imaging results that were available during my care of the patient were reviewed by me and considered in my  medical decision making (see chart for details).  Clinical Course as of Jun 15 1544  Sun Jun 15, 2553  [id number] 73 year old male on Eliquis here with acute onset of diffuse abdominal pain at around 10 AM.  It sounds like it started lower abdomen but now is generalized.  No nausea no vomiting.  Still passing gas.  Never had anything like this before.  He is tachycardic and normotensive.  Afebrile.  His abdominal exam is generalized tenderness with rebound -likely peritonitis.  No obvious hernias.  We are starting some IV fluids and antibiotics.  Awaiting CT.   [MB]    Clinical Course User Index [MB] Hayden Rasmussen, MD    73 y.o. male here with acute onset lower abd pain that began at 10am. On exam, moderate generalized abd TTP worse in the lower abdomen, voluntary guarding, some mild distension, hypoactive bowel sounds. Appears uncomfortable, mildly tachycardic. DDx includes obstruction vs perf vs diverticulitis, etc. Labs pending, will get CT abd/pelv, give pain meds, and reassess shortly. Discussed case with my attending Dr. Melina Copa who agrees with plan. He would like [place] to give abx empirically before we get the results back.   3:28 PM CBC WNL. CMP with AST 48, ALT 108, Tbili 1.6. Lipase WNL. CT with sigmoid diverticulitis with perforation, small fluid collection in the area concerning for contained perf vs abscess, small bowel dilation could represent ileus vs obstruction. Will consult surgery team, empiric abx already started  3:46 PM Dr. Marlou Starks of [facility] returning page, will consult on pt. Wants medical admission. Will consult for this.   4:01 PM Dr. Tyrell Antonio of [facility] returning page and will admit. Holding orders to be placed by admitting team. Please see their notes for further documentation of care. I appreciate  their help with this pleasant pt's care. Pt stable at time of admission.    Final Clinical Impressions(s) / ED Diagnoses   Final diagnoses:  Perforation of sigmoid colon due to  diverticulitis  Small bowel obstruction [facility])    ED Discharge Orders    [address] [place], [place] 06/15/18 1615    Hayden Rasmussen, MD 06/17/18 [phone]

## 2018-06-15 NOTE — Progress Notes (Addendum)
Pharmacy Antibiotic Note  [name] is a 73 y.o. male presented to the ED on 06/15/2018 with c/o abd pain. To start zosyn for intra-abdominal infection. Abd CT on 2/2 showed findings consistent with perforated sigmoid diverticulitis. Plan is to reverse Eliquis with Andexxa prior to taking patient to OR for exploratory surgery. To start zosyn for intra-abdominal infection.  Today, 06/15/2018: - scr 1.01 - weight 84 kg (05/30/18) - patient received cefepime 2gm and flagyl 500 mg IV dose in the ED around 3PM today  Plan: - zosyn 3.375 gm IV q8h (infuse over 4 hrs) - continue flagyl 500 mg IV q8h per Dr. Tyrell Antonio.     Temp (24hrs), Avg:98.6 F (37 C), Min:98.6 F (37 C), Max:98.6 F (37 C)  Recent Labs  Lab 06/15/18 1355  WBC 9.6  CREATININE 1.01    CrCl cannot be calculated (Unknown ideal weight.).    Allergies  Allergen Reactions  . Lidocaine Hcl Anaphylaxis and Other (See Comments)    Xylocaine  . Xylocaine [Lidocaine] Anaphylaxis  . Codeine Nausea And Vomiting  . Crestor [Rosuvastatin Calcium] Other (See Comments)    All-over body aches Myalgias   . Statins Other (See Comments)    Muscle soreness and an aching feeling all over Myalgias  . Percocet [Oxycodone-Acetaminophen] Itching     Thank you for allowing pharmacy to be a part of this patient's care.  Lynelle Doctor 06/15/2018 5:11 PM

## 2018-06-16 ENCOUNTER — Encounter (HOSPITAL_COMMUNITY): Payer: Self-pay | Admitting: General Surgery

## 2018-06-16 ENCOUNTER — Other Ambulatory Visit: Payer: Self-pay

## 2018-06-16 DIAGNOSIS — J849 Interstitial pulmonary disease, unspecified: Secondary | ICD-10-CM

## 2018-06-16 DIAGNOSIS — R945 Abnormal results of liver function studies: Secondary | ICD-10-CM

## 2018-06-16 DIAGNOSIS — Z79899 Other long term (current) drug therapy: Secondary | ICD-10-CM | POA: Insufficient documentation

## 2018-06-16 DIAGNOSIS — K5700 Diverticulitis of small intestine with perforation and abscess without bleeding: Secondary | ICD-10-CM

## 2018-06-16 DIAGNOSIS — D84821 Immunodeficiency due to drugs: Secondary | ICD-10-CM | POA: Insufficient documentation

## 2018-06-16 DIAGNOSIS — I10 Essential (primary) hypertension: Secondary | ICD-10-CM

## 2018-06-16 LAB — COMPREHENSIVE METABOLIC PANEL
ALT: 106 U/L — ABNORMAL HIGH (ref 0–44)
AST: 70 U/L — ABNORMAL HIGH (ref 15–41)
Albumin: 2.1 g/dL — ABNORMAL LOW (ref 3.5–5.0)
Alkaline Phosphatase: 90 U/L (ref 38–126)
Anion gap: 7 (ref 5–15)
BUN: 8 mg/dL (ref 8–23)
CO2: 22 mmol/L (ref 22–32)
Calcium: 6.9 mg/dL — ABNORMAL LOW (ref 8.9–10.3)
Chloride: 107 mmol/L (ref 98–111)
Creatinine, Ser: 0.75 mg/dL (ref 0.61–1.24)
GFR calc Af Amer: >60 mL/min (ref >60)
GFR calc non Af Amer: >60 mL/min (ref >60)
Glucose, Bld: 207 mg/dL — ABNORMAL HIGH (ref 70–99)
Potassium: 4.6 mmol/L (ref 3.5–5.1)
Sodium: 136 mmol/L (ref 135–145)
Total Bilirubin: 1.3 mg/dL — ABNORMAL HIGH (ref 0.3–1.2)
Total Protein: 5.3 g/dL — ABNORMAL LOW (ref 6.5–8.1)

## 2018-06-16 LAB — CBC
HCT: 40.3 % (ref 39.0–52.0)
Hemoglobin: 13.5 g/dL (ref 13.0–17.0)
MCH: 36.2 pg — ABNORMAL HIGH (ref 26.0–34.0)
MCHC: 33.5 g/dL (ref 30.0–36.0)
MCV: 108 fL — ABNORMAL HIGH (ref 80.0–100.0)
Platelets: 195 10*3/uL (ref 150–400)
RBC: 3.73 MIL/uL — ABNORMAL LOW (ref 4.22–5.81)
RDW: 13.1 % (ref 11.5–15.5)
WBC: 12.5 10*3/uL — ABNORMAL HIGH (ref 4.0–10.5)
nRBC: 0 % (ref 0.0–0.2)

## 2018-06-16 LAB — HIV ANTIBODY (ROUTINE TESTING W REFLEX): HIV Screen 4th Generation wRfx: NONREACTIVE

## 2018-06-16 MED ORDER — LIP MEDEX EX OINT
1.0000 "application " | TOPICAL_OINTMENT | Freq: Two times a day (BID) | CUTANEOUS | Status: DC
  Administered 2018-06-16 – 2018-06-24 (×15): 1 via TOPICAL
  Filled 2018-06-16 (×5): qty 7

## 2018-06-16 MED ORDER — MENTHOL 3 MG MT LOZG
1.0000 | LOZENGE | OROMUCOSAL | Status: DC | PRN
  Filled 2018-06-16: qty 9

## 2018-06-16 MED ORDER — METHOCARBAMOL 1000 MG/10ML IJ SOLN
500.0000 mg | Freq: Three times a day (TID) | INTRAVENOUS | Status: DC
  Administered 2018-06-16 – 2018-06-17 (×3): 500 mg via INTRAVENOUS
  Filled 2018-06-16: qty 500
  Filled 2018-06-16 (×2): qty 5
  Filled 2018-06-16: qty 500
  Filled 2018-06-16: qty 5

## 2018-06-16 MED ORDER — ALUM & MAG HYDROXIDE-SIMETH 200-200-20 MG/5ML PO SUSP: 30.0000 mL | Freq: Four times a day (QID) | ORAL | Status: DC | PRN

## 2018-06-16 MED ORDER — HYDROCORTISONE 1 % EX CREA
1.0000 "application " | TOPICAL_CREAM | Freq: Three times a day (TID) | CUTANEOUS | Status: DC | PRN
  Filled 2018-06-16: qty 28

## 2018-06-16 MED ORDER — PHENOL 1.4 % MT LIQD
1.0000 | OROMUCOSAL | Status: DC | PRN
  Filled 2018-06-16: qty 177

## 2018-06-16 MED ORDER — HYDROCORTISONE 2.5 % RE CREA
1.0000 "application " | TOPICAL_CREAM | Freq: Four times a day (QID) | RECTAL | Status: DC | PRN
  Filled 2018-06-16: qty 28.35

## 2018-06-16 MED ORDER — ACETAMINOPHEN 10 MG/ML IV SOLN
1000.0000 mg | Freq: Once | INTRAVENOUS | Status: AC
  Administered 2018-06-16: 1000 mg via INTRAVENOUS
  Filled 2018-06-16: qty 100

## 2018-06-16 MED ORDER — PROCHLORPERAZINE EDISYLATE 10 MG/2ML IJ SOLN: 5.0000 mg | INTRAMUSCULAR | Status: DC | PRN

## 2018-06-16 MED ORDER — SODIUM CHLORIDE 0.9 % IV SOLN
8.0000 mg | Freq: Four times a day (QID) | INTRAVENOUS | Status: DC | PRN
  Filled 2018-06-16: qty 4

## 2018-06-16 MED ORDER — GUAIFENESIN-DM 100-10 MG/5ML PO SYRP: 10.0000 mL | ORAL_SOLUTION | ORAL | Status: DC | PRN

## 2018-06-16 MED ORDER — ALBUTEROL SULFATE (2.5 MG/3ML) 0.083% IN NEBU
2.5000 mg | INHALATION_SOLUTION | Freq: Three times a day (TID) | RESPIRATORY_TRACT | Status: DC
  Administered 2018-06-16 – 2018-06-24 (×20): 2.5 mg via RESPIRATORY_TRACT
  Filled 2018-06-16 (×22): qty 3

## 2018-06-16 MED ORDER — HYDROMORPHONE HCL 1 MG/ML IJ SOLN
1.0000 mg | INTRAMUSCULAR | Status: DC | PRN
  Administered 2018-06-16 (×4): 2 mg via INTRAVENOUS
  Administered 2018-06-16: 1.5 mg via INTRAVENOUS
  Administered 2018-06-17 (×3): 2 mg via INTRAVENOUS
  Filled 2018-06-16 (×9): qty 2

## 2018-06-16 MED ORDER — ONDANSETRON HCL 4 MG/2ML IJ SOLN: 4.0000 mg | Freq: Four times a day (QID) | INTRAMUSCULAR | Status: DC | PRN

## 2018-06-16 MED ORDER — METOPROLOL TARTRATE 5 MG/5ML IV SOLN: 5.0000 mg | Freq: Four times a day (QID) | INTRAVENOUS | Status: DC | PRN

## 2018-06-16 MED ORDER — MAGIC MOUTHWASH
15.0000 mL | Freq: Four times a day (QID) | ORAL | Status: DC | PRN
  Filled 2018-06-16: qty 15

## 2018-06-16 MED ORDER — HYDRALAZINE HCL 20 MG/ML IJ SOLN
5.0000 mg | INTRAMUSCULAR | Status: DC | PRN
  Administered 2018-06-16: 5 mg via INTRAVENOUS
  Administered 2018-06-16: 10 mg via INTRAVENOUS
  Filled 2018-06-16 (×2): qty 1

## 2018-06-16 MED ORDER — HYDROMORPHONE HCL 1 MG/ML IJ SOLN: 1.0000 mg | INTRAMUSCULAR | Status: DC | PRN

## 2018-06-16 NOTE — Progress Notes (Signed)
[name] Progress Note  1 Day Post-Op  Subjective: CC-  Patient states that he's still having a lot of lower abdominal pain. Denies n/v. No flatus or stool from ostomy.  Objective: Vital signs in last 24 hours: Temp:  [97.3 F (36.3 C)-98.7 F (37.1 C)] 97.3 F (36.3 C) (02/03 0733) Pulse Rate:  [80-120] 94 (02/03 0600) Resp:  [10-25] 16 (02/03 0600) BP: (130-158)/(74-102) 144/87 (02/03 0600) SpO2:  [92 %-100 %] 98 % (02/03 0600) Weight:  [83 kg] 83 kg (02/02 1758)    Intake/Output from previous day: 02/02 0701 - 02/03 0700 In: 3402.5 [I.V.:1702.5; IV Piggyback:1300] Out: 2400 [Urine:1700; Emesis/NG output:300; Blood:400] Intake/Output this shift: No intake/output data recorded.  PE: Gen:  Alert, NAD, pleasant HEENT: EOM's intact, pupils equal and round Pulm:  effort normal Abd: Soft, mild distension, mild lower abdominal TTP, cdi dressing to midline wound, hypoactive BS, ostomy pink with no air or stool in pouch Psych: A&Ox3  Skin: no rashes noted, warm and dry  Lab Results:  Recent Labs    06/15/18 1355 06/15/18 2006 06/16/18 0336  WBC 9.6  --  12.5*  HGB 16.0 11.2* 13.5  HCT 46.4 33.0* 40.3  PLT 186  --  195   BMET Recent Labs    06/15/18 1355 06/15/18 2006 06/16/18 0336  NA 136 135 136  K 3.9 4.5 4.6  CL 102  --  107  CO2 24  --  22  GLUCOSE 138* 170* 207*  BUN 13  --  8  CREATININE 1.01  --  0.75  CALCIUM 8.4*  --  6.9*   PT/INR No results for input(s): LABPROT, INR in the last 72 hours. CMP     Component Value Date/Time   NA 136 06/16/2018 0336   K 4.6 06/16/2018 0336   CL 107 06/16/2018 0336   CO2 22 06/16/2018 0336   GLUCOSE 207 (H) 06/16/2018 0336   BUN 8 06/16/2018 0336   CREATININE 0.75 06/16/2018 0336   CREATININE 0.95 07/16/2016 0902   CALCIUM 6.9 (L) 06/16/2018 0336   PROT 5.3 (L) 06/16/2018 0336   ALBUMIN 2.1 (L) 06/16/2018 0336   AST 70 (H) 06/16/2018 0336   ALT 106 (H) 06/16/2018 0336   ALKPHOS 90  06/16/2018 0336   BILITOT 1.3 (H) 06/16/2018 0336   GFRNONAA >60 06/16/2018 0336   GFRAA >60 06/16/2018 0336   Lipase     Component Value Date/Time   LIPASE 29 06/15/2018 1355       Studies/Results: Ct Abdomen Pelvis W Contrast  Result Date: 06/15/2018 CLINICAL DATA:  Acute generalized abdominal pain. EXAM: CT ABDOMEN AND PELVIS WITH CONTRAST TECHNIQUE: Multidetector CT imaging of the abdomen and pelvis was performed using the standard protocol following bolus administration of intravenous contrast. CONTRAST:  131mL ISOVUE-300 IOPAMIDOL (ISOVUE-300) INJECTION 61% COMPARISON:  CT scan of Sep 22, 2012. FINDINGS: Lower chest: No acute abnormality. Hepatobiliary: No focal liver abnormality is seen. Status post cholecystectomy. No biliary dilatation. Pancreas: Unremarkable. No pancreatic ductal dilatation or surrounding inflammatory changes. Spleen: Normal in size without focal abnormality. Adrenals/Urinary Tract: Adrenal glands are unremarkable. Kidneys are normal, without renal calculi, focal lesion, or hydronephrosis. Bladder is unremarkable. Stomach/Bowel: The stomach appears normal. Status post appendectomy. Proximal sigmoid diverticulitis is noted with a large amount of free air in the adjacent peritoneal fat and extending into the epigastric region consistent with perforation. 5.2 x 4.3 cm fluid collection with air-fluid level is noted in this area concerning for contained perforation or developing  abscess. Mild small bowel dilatation is noted which may represent ileus or possibly obstruction. Vascular/Lymphatic: No significant vascular findings are present. No enlarged abdominal or pelvic lymph nodes. Reproductive: Prostate is unremarkable. Other: Mild fat containing left inguinal hernia is noted. Musculoskeletal: Status post surgical posterior fusion of L2-3, L3-4 and L4-5. No acute osseous abnormality is noted. IMPRESSION: Proximal sigmoid diverticulitis is noted with a large amount of free air  in the adjacent peritoneal fat and extending in the epigastric region consistent with perforation. 5.2 x 4.3 cm fluid collection with air-fluid level is noted in this area concerning for contained perforation or developing abscess. Mild small bowel dilatation is noted which may represent ileus or possibly obstruction. Critical Value/emergent results were called by telephone at the time of interpretation on 06/15/2018 at 3:13 pm to Dr. Reece Agar , who verbally acknowledged these results. Electronically Signed   By: Marijo Conception, M.D.   On: 06/15/2018 15:14    Anti-infectives: Anti-infectives (From admission, onward)   Start     Dose/Rate Route Frequency Ordered Stop   06/15/18 2359  metroNIDAZOLE (FLAGYL) IVPB 500 mg     500 mg 100 mL/hr over 60 Minutes Intravenous Every 8 hours 06/15/18 1655     06/15/18 2300  piperacillin-tazobactam (ZOSYN) IVPB 3.375 g  Status:  Discontinued     3.375 g 12.5 mL/hr over 240 Minutes Intravenous Every 8 hours 06/15/18 2256 06/15/18 2310   06/15/18 2200  piperacillin-tazobactam (ZOSYN) IVPB 3.375 g     3.375 g 12.5 mL/hr over 240 Minutes Intravenous Every 8 hours 06/15/18 1743     06/15/18 1445  ceFEPIme (MAXIPIME) 2 g in sodium chloride 0.9 % 100 mL IVPB     2 g 200 mL/hr over 30 Minutes Intravenous  Once 06/15/18 1431 06/15/18 1606   06/15/18 1445  metroNIDAZOLE (FLAGYL) IVPB 500 mg     500 mg 100 mL/hr over 60 Minutes Intravenous  Once 06/15/18 1431 06/15/18 1711       Assessment/Plan Afib RVR - on cardizem and metoprolol, hold eliquis Interstitial lung disease - on chronic prednisone HTN Transaminitis/hyperbilirubinemia - hepatitis panel pending  Perforated sigmoid colon S/p EXPLORATORY LAPAROTOMY WITH SIGMOID COLON RESECTION AND END COLOSTOMY([facility]) 2/2 Dr. Marlou Starks - POD 1 - surgical pathology pending - start BID wet to dry dressing changes tomorrow  ID - zosyn/flagyl 2/2>> FEN - IVF, NPO VTE - SCDs, sq heparin Foley - in  place Follow up - Dr. Marlou Starks  Plan - Continue NPO/NG tube and await return in bowel function. He needs to get OOB to chair today. Consult PT/OT. Ok to d/c foley after he works with therapies. Consult WOC RN for new ostomy. Add IV tylenol for better pain control.    LOS: 1 day    Wellington Hampshire , [facility] Roberts 06/16/2018, 8:10 AM Pager: [phone] Mon-Thurs 7:00 am-4:30 pm Fri 7:00 am -11:30 AM Sat-Sun 7:00 am-11:30 am

## 2018-06-16 NOTE — Progress Notes (Signed)
[facility]                                                                              Patient Demographics  [name], is a 73 y.o. male, DOB - 1945-11-20, WNU:[id number]  Admit date - 06/15/2018   Admitting Physician Elmarie Shiley, MD  Outpatient Primary MD for the patient is [name], MD  Outpatient specialists:   LOS - 1  days   Medical records reviewed and are as summarized below:    Chief Complaint  Patient presents with  . Abdominal Pain       Brief summary   Patient is 73 year old male with a history of interstitial lung disease on prednisone, CellCept, and nintedanib, A. 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  No nausea vomiting fevers.  He does have chronic shortness of breath due to ILD. CT abdomen pelvis showed proximal sigmoid diverticulitis with large amount of free air in the adjacent peritoneal fat and extending in the epigastric region consistent with perforation.  5.2x 4.3 cm fluid collection with air-fluid levels in the area concerning for contained perforation or developing abscess.  Mild small bowel dilatation may represent ileus or possibly obstruction.   Assessment & Plan    Principal Problem: Acute sigmoid diverticulitis with perforation s/p Hartmann colectomy/colostomy 06/15/2018 with ileus -Patient placed on broad-spectrum antibiotics, surgery was consulted, Eliquis held -N.p.o., IV fluids, underwent expiratory laparotomy with sigmoid colon resection and end colostomy -Postop day #1, management per general surgery  Active Problems:   Abnormal LFTs -In the setting of acute diverticulitis, perforation -Follow acute hepatitis panel    Essential hypertension -Continue IV metoprolol    ILD (interstitial lung disease) (HCC) Continue to hold CellCept, nintedanib, has been on prednisone  for many years -Continue low-dose Solu-Medrol to avoid adrenal insufficiency  -Continue Symbicort, nebulizer treatments     PAF (paroxysmal atrial fibrillation) (HCC) -Continue IV Lopressor, continue to hold Eliquis -Once stable from surgery standpoint, will place on heparin drip until patient is cleared to restart oral anticoagulation   Code Status: Full CODE STATUS DVT Prophylaxis: Heparin subcu Family Communication: Discussed in detail with the patient, all imaging results, lab results explained to the patient    Disposition Plan: Continue stepdown  Time Spent in minutes   35 minutes  Procedures:  Expiratory laparotomy with sigmoid colon resection and colostomy  Consultants:   General surgery*  Antimicrobials:   Anti-infectives (From admission, onward)   Start     Dose/Rate Route Frequency Ordered Stop   06/15/18 2359  metroNIDAZOLE (FLAGYL) IVPB 500 mg  Status:  Discontinued     500 mg 100 mL/hr over 60 Minutes Intravenous Every 8 hours 06/15/18 1655 06/16/18 0934   06/15/18 2300  piperacillin-tazobactam (ZOSYN) IVPB 3.375 g  Status:  Discontinued     3.375 g 12.5 mL/hr over 240 Minutes Intravenous Every 8 hours 06/15/18 2256 06/15/18 2310   06/15/18 2200  piperacillin-tazobactam (ZOSYN) IVPB 3.375 g     3.375 g 12.5 mL/hr over 240 Minutes  Intravenous Every 8 hours 06/15/18 1743     06/15/18 1445  ceFEPIme (MAXIPIME) 2 g in sodium chloride 0.9 % 100 mL IVPB     2 g 200 mL/hr over 30 Minutes Intravenous  Once 06/15/18 1431 06/15/18 1606   06/15/18 1445  metroNIDAZOLE (FLAGYL) IVPB 500 mg     500 mg 100 mL/hr over 60 Minutes Intravenous  Once 06/15/18 1431 06/15/18 1711         Medications  Scheduled Meds: . albuterol  2.5 mg Nebulization Q6H  . heparin  5,000 Units Subcutaneous Q8H  . lip balm  1 application Topical BID  . methylPREDNISolone (SOLU-MEDROL) injection  20 mg Intravenous Daily  . metoprolol tartrate  5 mg Intravenous Q8H  .  mometasone-formoterol  2 puff Inhalation BID  . pantoprazole (PROTONIX) IV  40 mg Intravenous QHS   Continuous Infusions: . sodium chloride 75 mL/hr at 06/15/18 2258  . dextrose 5 % and 0.9 % NaCl with KCl 20 mEq/L Stopped (06/15/18 2303)  . methocarbamol (ROBAXIN) IV    . ondansetron (ZOFRAN) IV    . piperacillin-tazobactam (ZOSYN)  IV Stopped (06/16/18 0932)  . sodium chloride     PRN Meds:.alum & mag hydroxide-simeth, fluticasone, guaiFENesin-dextromethorphan, hydrALAZINE, hydrocortisone, hydrocortisone cream, HYDROmorphone (DILAUDID) injection, magic mouthwash, menthol-cetylpyridinium, metoprolol tartrate, ondansetron (ZOFRAN) IV **OR** ondansetron (ZOFRAN) IV, phenol, prochlorperazine      Subjective:   [name] was seen and examined today.  Complaining of pain in abdomen 10/10, no nausea vomiting or diarrhea.  No fevers.  Patient denies dizziness, chest pain, shortness of breath.    Objective:   Vitals:   06/16/18 0756 06/16/18 0800 06/16/18 0835 06/16/18 0911  BP:  (!) 154/97    Pulse: 99 95  99  Resp: 18 11  (!) 21  Temp:      TempSrc:      SpO2: 96% 96% 100% 97%  Weight:      Height:        Intake/Output Summary (Last 24 hours) at 06/16/2018 1130 Last data filed at 06/16/2018 0906 Gross per 24 hour  Intake 3622.64 ml  Output 2400 ml  Net 1222.64 ml     Wt Readings from Last 3 Encounters:  06/15/18 83 kg  05/30/18 84.2 kg  05/02/18 85.3 kg     Exam  General: Alert and oriented x 3, NAD  Eyes:,  HEENT:    Cardiovascular: S1 S2 auscultated,  Regular rate and rhythm.  Respiratory: Clear to auscultation bilaterally, no wheezing, rales or rhonchi  Gastrointestinal: Soft, mild distention with the lower abdominal TTP, dressing intact.  Ostomy with no air or stool in pouch.  Ext: no pedal edema bilaterally  Neuro: no new deficit  Musculoskeletal: No digital cyanosis, clubbing  Skin: No rashes  Psych: Normal affect and demeanor, alert and  oriented x3    Data Reviewed:  I have personally reviewed following labs and imaging studies  Micro Results Recent Results (from the past 240 hour(s))  Aerobic/Anaerobic Culture (surgical/deep wound)     Status: None (Preliminary result)   Collection Time: 06/15/18  8:17 PM  Result Value Ref Range Status   Specimen Description   Final    ABSCESS Performed at [facility] [address], [place], [place] [address]    Special Requests   Final    NONE Performed at [facility], [place] [address], [place], [place] [address]    Gram Stain   Final    MODERATE WBC  PRESENT, PREDOMINANTLY PMN FEW GRAM POSITIVE COCCI FEW GRAM POSITIVE RODS Performed at [facility], [place] [address], [place], [place] [address]    Culture PENDING  Incomplete   Report Status PENDING  Incomplete  MRSA PCR Screening     Status: None   Collection Time: 06/15/18 10:22 PM  Result Value Ref Range Status   MRSA by PCR NEGATIVE NEGATIVE Final    Comment:        The GeneXpert MRSA Assay (FDA approved for NASAL specimens only), is one component of a comprehensive MRSA colonization surveillance program. It is not intended to diagnose MRSA infection nor to guide or monitor treatment for MRSA infections. Performed at [facility], [place] [address], [place],  [address]     Radiology Reports Ct Abdomen Pelvis W Contrast  Result Date: 06/15/2018 CLINICAL DATA:  Acute generalized abdominal pain. EXAM: CT ABDOMEN AND PELVIS WITH CONTRAST TECHNIQUE: Multidetector CT imaging of the abdomen and pelvis was performed using the standard protocol following bolus administration of intravenous contrast. CONTRAST:  113mL ISOVUE-300 IOPAMIDOL (ISOVUE-300) INJECTION 61% COMPARISON:  CT scan of Sep 22, 2012. FINDINGS: Lower chest: No acute abnormality. Hepatobiliary: No focal liver abnormality is seen. Status post cholecystectomy. No biliary dilatation. Pancreas:  Unremarkable. No pancreatic ductal dilatation or surrounding inflammatory changes. Spleen: Normal in size without focal abnormality. Adrenals/Urinary Tract: Adrenal glands are unremarkable. Kidneys are normal, without renal calculi, focal lesion, or hydronephrosis. Bladder is unremarkable. Stomach/Bowel: The stomach appears normal. Status post appendectomy. Proximal sigmoid diverticulitis is noted with a large amount of free air in the adjacent peritoneal fat and extending into the epigastric region consistent with perforation. 5.2 x 4.3 cm fluid collection with air-fluid level is noted in this area concerning for contained perforation or developing abscess. Mild small bowel dilatation is noted which may represent ileus or possibly obstruction. Vascular/Lymphatic: No significant vascular findings are present. No enlarged abdominal or pelvic lymph nodes. Reproductive: Prostate is unremarkable. Other: Mild fat containing left inguinal hernia is noted. Musculoskeletal: Status post surgical posterior fusion of L2-3, L3-4 and L4-5. No acute osseous abnormality is noted. IMPRESSION: Proximal sigmoid diverticulitis is noted with a large amount of free air in the adjacent peritoneal fat and extending in the epigastric region consistent with perforation. 5.2 x 4.3 cm fluid collection with air-fluid level is noted in this area concerning for contained perforation or developing abscess. Mild small bowel dilatation is noted which may represent ileus or possibly obstruction. Critical Value/emergent results were called by telephone at the time of interpretation on 06/15/2018 at 3:13 pm to Dr. Reece Agar , who verbally acknowledged these results. Electronically Signed   By: Marijo Conception, M.D.   On: 06/15/2018 15:14    Lab Data:  CBC: Recent Labs  Lab 06/15/18 1355 06/15/18 2006 06/16/18 0336  WBC 9.6  --  12.5*  HGB 16.0 11.2* 13.5  HCT 46.4 33.0* 40.3  MCV 105.9*  --  108.0*  PLT 186  --  681   Basic  Metabolic Panel: Recent Labs  Lab 06/15/18 1355 06/15/18 2006 06/16/18 0336  NA 136 135 136  K 3.9 4.5 4.6  CL 102  --  107  CO2 24  --  22  GLUCOSE 138* 170* 207*  BUN 13  --  8  CREATININE 1.01  --  0.75  CALCIUM 8.4*  --  6.9*   GFR: Estimated Creatinine Clearance: 85.9 mL/min (by C-G formula based on SCr of 0.75 mg/dL). Liver Function  Tests: Recent Labs  Lab 06/15/18 1355 06/16/18 0336  AST 48* 70*  ALT 108* 106*  ALKPHOS 124 90  BILITOT 1.6* 1.3*  PROT 6.5 5.3*  ALBUMIN 3.0* 2.1*   Recent Labs  Lab 06/15/18 1355  LIPASE 29   No results for input(s): AMMONIA in the last 168 hours. Coagulation Profile: No results for input(s): INR, PROTIME in the last 168 hours. Cardiac Enzymes: No results for input(s): CKTOTAL, CKMB, CKMBINDEX, TROPONINI in the last 168 hours. BNP (last 3 results) No results for input(s): PROBNP in the last 8760 hours. HbA1C: No results for input(s): HGBA1C in the last 72 hours. CBG: No results for input(s): GLUCAP in the last 168 hours. Lipid Profile: No results for input(s): CHOL, HDL, LDLCALC, TRIG, CHOLHDL, LDLDIRECT in the last 72 hours. Thyroid Function Tests: No results for input(s): TSH, T4TOTAL, FREET4, T3FREE, THYROIDAB in the last 72 hours. Anemia Panel: No results for input(s): VITAMINB12, FOLATE, FERRITIN, TIBC, IRON, RETICCTPCT in the last 72 hours. Urine analysis:    Component Value Date/Time   COLORURINE YELLOW 02/17/2018 1404   APPEARANCEUR CLEAR 02/17/2018 1404   LABSPEC 1.020 02/17/2018 1404   PHURINE 5.0 02/17/2018 1404   GLUCOSEU NEGATIVE 02/17/2018 1404   HGBUR NEGATIVE 02/17/2018 1404   BILIRUBINUR NEGATIVE 02/17/2018 1404   KETONESUR NEGATIVE 02/17/2018 1404   PROTEINUR NEGATIVE 02/17/2018 1404   UROBILINOGEN 0.2 01/22/2007 0720   NITRITE NEGATIVE 02/17/2018 1404   LEUKOCYTESUR NEGATIVE 02/17/2018 1404     Ripudeep Rai M.D. [facility] 06/16/2018, 11:30 AM  Pager: [phone] Between 7am to 7pm -  call Pager - [phone]  After 7pm go to [email or web address] - password [email or web address]  Call night coverage person covering after 7pm

## 2018-06-16 NOTE — Evaluation (Signed)
Physical Therapy Evaluation Patient Details Name: [name] MRN: [id number] DOB: 04-15-1946 Today's Date: 06/16/2018   History of Present Illness  73 year old male with a history of interstitial lung disease, A. 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 Dx of bowel perforation, s/p colectomy/colostomy on 06/15/18.  Clinical Impression  Pt admitted with above diagnosis. Pt currently with functional limitations due to the deficits listed below (see PT Problem List). PT evaluation limited by pain. BUE/LE AROM is WNL. Verbally Instructed pt/spouse in log rolling technique for getting out of bed. Pt had severe lower abdominal pain after taking a deep breath and was then unable to attempt mobility. RN notified of request for pain medication. Will attempt mobility tomorrow. Pt will benefit from skilled PT to increase their independence and safety with mobility to allow discharge to the venue listed below.       Follow Up Recommendations Other (comment)(to be determined)    Equipment Recommendations  Rolling walker with 5" wheels    Recommendations for Other Services       Precautions / Restrictions Precautions Precautions: Other (comment) Precaution Comments: abdominal surgery Restrictions Weight Bearing Restrictions: No      Mobility  Bed Mobility               General bed mobility comments: unable to tolerate 2* severe abdominal pain  Transfers                    Ambulation/Gait                Stairs            Wheelchair Mobility    Modified Rankin (Stroke Patients Only)       Balance                                             Pertinent Vitals/Pain Pain Assessment: 0-10 Pain Score: 8  Pain Location: abdominal pain Pain Descriptors / Indicators: Grimacing;Moaning Pain Intervention(s): Limited activity within patient's tolerance;Monitored  during session;Patient requesting pain meds-RN notified    Home Living Family/patient expects to be discharged to:: Private residence Living Arrangements: Spouse/significant other Available Help at Discharge: Family Type of Home: House Home Access: Stairs to enter Entrance Stairs-Rails: None Technical brewer of Steps: 1 Home Layout: One level Home Equipment: Bedside commode      Prior Function Level of Independence: Independent               Hand Dominance        Extremity/Trunk Assessment   Upper Extremity Assessment Upper Extremity Assessment: Overall WFL for tasks assessed    Lower Extremity Assessment Lower Extremity Assessment: Overall WFL for tasks assessed       Communication   Communication: No difficulties  Cognition Arousal/Alertness: Awake/alert Behavior During Therapy: WFL for tasks assessed/performed Overall Cognitive Status: Within Functional Limits for tasks assessed                                        General Comments      Exercises General Exercises - Lower Extremity Ankle Circles/Pumps: AROM;Both;10 reps;Supine Heel Slides: AROM;Both;5 reps;Supine   Assessment/Plan    PT Assessment Patient needs continued PT  services  PT Problem List Decreased mobility;Pain;Decreased activity tolerance       PT Treatment Interventions DME instruction;Gait training;Functional mobility training;Therapeutic activities;Patient/family education    PT Goals (Current goals can be found in the Care Plan section)  Acute Rehab PT Goals Patient Stated Goal: return to independence with mobility PT Goal Formulation: With patient/family Time For Goal Achievement: 06/30/18 Potential to Achieve Goals: Good    Frequency Min 3X/week   Barriers to discharge        Co-evaluation               AM-PAC PT "6 Clicks" Mobility  Outcome Measure Help needed turning from your back to your side while in a flat bed without using  bedrails?: Total Help needed moving from lying on your back to sitting on the side of a flat bed without using bedrails?: Total Help needed moving to and from a bed to a chair (including a wheelchair)?: Total Help needed standing up from a chair using your arms (e.g., wheelchair or bedside chair)?: Total Help needed to walk in hospital room?: Total Help needed climbing 3-5 steps with a railing? : Total 6 Click Score: 6    End of Session Equipment Utilized During Treatment: Gait belt Activity Tolerance: Patient limited by pain Patient left: in bed;with call bell/phone within reach;with family/visitor present Nurse Communication: Patient requests pain meds PT Visit Diagnosis: Pain;Difficulty in walking, not elsewhere classified (R26.2)    Time: 8101-7510 PT Time Calculation (min) (ACUTE ONLY): 13 min   Charges:   PT Evaluation $PT Eval Low Complexity: 1 Low         Philomena Doheny PT 06/16/2018  Acute Rehabilitation Services Pager [phone] Office [phone]

## 2018-06-16 NOTE — Consult Note (Addendum)
[place] Nurse ostomy consult note Surgical team following for assessment and plan of care for abd surgical wound.  Stoma type/location: Colostomy surgery performed 2/2 Stomal assessment/size: Stoma red and viable when visualized through the pouch, which is intact with good seal. Output: Small amt bloody drainage in the pouch, no stool or flatus Ostomy pouching: 1pc. Education provided:  It is the first post-op day; patient has an NG and is in pain.  Will perform first post-op pouch change demonstration and teaching session tomorrow.  Supplies left at the bedside for staff nurse use. Enrolled patient in [employer] program: No Julien Girt MSN, [place], [place], [place], [place]

## 2018-06-17 ENCOUNTER — Telehealth: Payer: Self-pay | Admitting: Pulmonary Disease

## 2018-06-17 DIAGNOSIS — K5701 Diverticulitis of small intestine with perforation and abscess with bleeding: Secondary | ICD-10-CM

## 2018-06-17 LAB — BASIC METABOLIC PANEL
Anion gap: 9 (ref 5–15)
BUN: 12 mg/dL (ref 8–23)
CO2: 22 mmol/L (ref 22–32)
Calcium: 7.2 mg/dL — ABNORMAL LOW (ref 8.9–10.3)
Chloride: 106 mmol/L (ref 98–111)
Creatinine, Ser: 0.68 mg/dL (ref 0.61–1.24)
GFR calc Af Amer: >60 mL/min (ref >60)
GFR calc non Af Amer: >60 mL/min (ref >60)
Glucose, Bld: 204 mg/dL — ABNORMAL HIGH (ref 70–99)
Potassium: 3.9 mmol/L (ref 3.5–5.1)
Sodium: 137 mmol/L (ref 135–145)

## 2018-06-17 LAB — APTT
aPTT: 28 seconds (ref 24–36)
aPTT: 65 seconds — ABNORMAL HIGH (ref 24–36)

## 2018-06-17 LAB — HEPARIN LEVEL (UNFRACTIONATED)
Heparin Unfractionated: 0.13 IU/mL — ABNORMAL LOW (ref 0.30–0.70)
Heparin Unfractionated: 0.52 IU/mL (ref 0.30–0.70)

## 2018-06-17 LAB — CBC
HCT: 37.3 % — ABNORMAL LOW (ref 39.0–52.0)
Hemoglobin: 12.3 g/dL — ABNORMAL LOW (ref 13.0–17.0)
MCH: 36.2 pg — ABNORMAL HIGH (ref 26.0–34.0)
MCHC: 33 g/dL (ref 30.0–36.0)
MCV: 109.7 fL — ABNORMAL HIGH (ref 80.0–100.0)
Platelets: 196 10*3/uL (ref 150–400)
RBC: 3.4 MIL/uL — ABNORMAL LOW (ref 4.22–5.81)
RDW: 13.3 % (ref 11.5–15.5)
WBC: 14.8 10*3/uL — ABNORMAL HIGH (ref 4.0–10.5)
nRBC: 0 % (ref 0.0–0.2)

## 2018-06-17 LAB — HEPATITIS PANEL, ACUTE
HCV Ab: <0.1 s/co ratio (ref 0.0–0.9)
Hep A IgM: NEGATIVE
Hep B C IgM: NEGATIVE
Hepatitis B Surface Ag: NEGATIVE

## 2018-06-17 MED ORDER — METHOCARBAMOL 1000 MG/10ML IJ SOLN
1000.0000 mg | Freq: Four times a day (QID) | INTRAVENOUS | Status: DC
  Administered 2018-06-17 – 2018-06-19 (×7): 1000 mg via INTRAVENOUS
  Filled 2018-06-17 (×8): qty 10

## 2018-06-17 MED ORDER — ACETAMINOPHEN 10 MG/ML IV SOLN
1000.0000 mg | Freq: Once | INTRAVENOUS | Status: AC
  Administered 2018-06-17: 1000 mg via INTRAVENOUS
  Filled 2018-06-17: qty 100

## 2018-06-17 MED ORDER — HYDROMORPHONE HCL 1 MG/ML IJ SOLN
1.0000 mg | INTRAMUSCULAR | Status: DC | PRN
  Administered 2018-06-17 (×2): 2 mg via INTRAVENOUS
  Administered 2018-06-17 – 2018-06-18 (×2): 1 mg via INTRAVENOUS
  Administered 2018-06-18 (×2): 2 mg via INTRAVENOUS
  Administered 2018-06-18 – 2018-06-20 (×6): 1 mg via INTRAVENOUS
  Filled 2018-06-17: qty 1
  Filled 2018-06-17 (×3): qty 2
  Filled 2018-06-17 (×3): qty 1
  Filled 2018-06-17: qty 2
  Filled 2018-06-17: qty 1
  Filled 2018-06-17: qty 2
  Filled 2018-06-17: qty 1

## 2018-06-17 MED ORDER — SODIUM CHLORIDE 0.9% FLUSH: 9.0000 mL | INTRAVENOUS | Status: DC | PRN

## 2018-06-17 MED ORDER — DIPHENHYDRAMINE HCL 12.5 MG/5ML PO ELIX: 12.5000 mg | ORAL_SOLUTION | Freq: Four times a day (QID) | ORAL | Status: DC | PRN

## 2018-06-17 MED ORDER — HEPARIN BOLUS VIA INFUSION
4000.0000 [IU] | Freq: Once | INTRAVENOUS | Status: AC
  Administered 2018-06-17: 4000 [IU] via INTRAVENOUS
  Filled 2018-06-17: qty 4000

## 2018-06-17 MED ORDER — METHOCARBAMOL 1000 MG/10ML IJ SOLN
500.0000 mg | Freq: Four times a day (QID) | INTRAVENOUS | Status: DC
  Filled 2018-06-17: qty 5

## 2018-06-17 MED ORDER — DIPHENHYDRAMINE HCL 50 MG/ML IJ SOLN: 12.5000 mg | Freq: Four times a day (QID) | INTRAMUSCULAR | Status: DC | PRN

## 2018-06-17 MED ORDER — NALOXONE HCL 0.4 MG/ML IJ SOLN: 0.4000 mg | INTRAMUSCULAR | Status: DC | PRN

## 2018-06-17 MED ORDER — HYDROMORPHONE 1 MG/ML IV SOLN: INTRAVENOUS | Status: DC

## 2018-06-17 MED ORDER — ONDANSETRON HCL 4 MG/2ML IJ SOLN: 4.0000 mg | Freq: Four times a day (QID) | INTRAMUSCULAR | Status: DC | PRN

## 2018-06-17 MED ORDER — HEPARIN (PORCINE) 25000 UT/250ML-% IV SOLN
1200.0000 [IU]/h | INTRAVENOUS | Status: DC
  Administered 2018-06-17 – 2018-06-18 (×3): 1200 [IU]/h via INTRAVENOUS
  Filled 2018-06-17 (×3): qty 250

## 2018-06-17 MED ORDER — ACETAMINOPHEN 10 MG/ML IV SOLN
1000.0000 mg | Freq: Four times a day (QID) | INTRAVENOUS | Status: AC
  Administered 2018-06-17 – 2018-06-18 (×4): 1000 mg via INTRAVENOUS
  Filled 2018-06-17 (×4): qty 100

## 2018-06-17 NOTE — Progress Notes (Signed)
[facility] Progress Note  2 Days Post-Op  Subjective: CC-  States that he's still having a lot of lower abdominal pain. IV tylenol helps. States that if he doesn't take dilaudid every 2 hours the pain gets ahead of him. Worked with PT yesterday but was not able to get out of bed because of the pain. Denies n/v. No output from ostomy. NG tube with 1150cc output in 24 hours, but he is eating lots of ice chips.  Objective: Vital signs in last 24 hours: Temp:  [97.5 F (36.4 C)-98.1 F (36.7 C)] 98.1 F (36.7 C) (02/04 0400) Pulse Rate:  [87-117] 97 (02/04 0600) Resp:  [4-29] 9 (02/04 0600) BP: (134-179)/(57-121) 135/83 (02/04 0600) SpO2:  [89 %-100 %] 91 % (02/04 0600)    Intake/Output from previous day: 02/03 0701 - 02/04 0700 In: 2067.2 [P.O.:170; I.V.:1575; IV Piggyback:322.2] Out: 2400 [Urine:1250; Emesis/NG output:1150] Intake/Output this shift: No intake/output data recorded.  PE: Gen:  Alert, NAD, pleasant HEENT: EOM's intact, pupils equal and round Pulm:  effort normal Psych: A&Ox3  Skin: warm and dry Abd: Soft, mild distension, mild lower abdominal TTP, midline wound pink with trace bloody ooze, hypoactive BS, ostomy pink with no air or stool in pouch     Lab Results:  Recent Labs    06/16/18 0336 06/17/18 0242  WBC 12.5* 14.8*  HGB 13.5 12.3*  HCT 40.3 37.3*  PLT 195 196   BMET Recent Labs    06/16/18 0336 06/17/18 0242  NA 136 137  K 4.6 3.9  CL 107 106  CO2 22 22  GLUCOSE 207* 204*  BUN 8 12  CREATININE 0.75 0.68  CALCIUM 6.9* 7.2*   PT/INR No results for input(s): LABPROT, INR in the last 72 hours. CMP     Component Value Date/Time   NA 137 06/17/2018 0242   K 3.9 06/17/2018 0242   CL 106 06/17/2018 0242   CO2 22 06/17/2018 0242   GLUCOSE 204 (H) 06/17/2018 0242   BUN 12 06/17/2018 0242   CREATININE 0.68 06/17/2018 0242   CREATININE 0.95 07/16/2016 0902   CALCIUM 7.2 (L) 06/17/2018 0242   PROT 5.3 (L) 06/16/2018  0336   ALBUMIN 2.1 (L) 06/16/2018 0336   AST 70 (H) 06/16/2018 0336   ALT 106 (H) 06/16/2018 0336   ALKPHOS 90 06/16/2018 0336   BILITOT 1.3 (H) 06/16/2018 0336   GFRNONAA >60 06/17/2018 0242   GFRAA >60 06/17/2018 0242   Lipase     Component Value Date/Time   LIPASE 29 06/15/2018 1355       Studies/Results: Ct Abdomen Pelvis W Contrast  Result Date: 06/15/2018 CLINICAL DATA:  Acute generalized abdominal pain. EXAM: CT ABDOMEN AND PELVIS WITH CONTRAST TECHNIQUE: Multidetector CT imaging of the abdomen and pelvis was performed using the standard protocol following bolus administration of intravenous contrast. CONTRAST:  128mL ISOVUE-300 IOPAMIDOL (ISOVUE-300) INJECTION 61% COMPARISON:  CT scan of Sep 22, 2012. FINDINGS: Lower chest: No acute abnormality. Hepatobiliary: No focal liver abnormality is seen. Status post cholecystectomy. No biliary dilatation. Pancreas: Unremarkable. No pancreatic ductal dilatation or surrounding inflammatory changes. Spleen: Normal in size without focal abnormality. Adrenals/Urinary Tract: Adrenal glands are unremarkable. Kidneys are normal, without renal calculi, focal lesion, or hydronephrosis. Bladder is unremarkable. Stomach/Bowel: The stomach appears normal. Status post appendectomy. Proximal sigmoid diverticulitis is noted with a large amount of free air in the adjacent peritoneal fat and extending into the epigastric region consistent with perforation. 5.2 x 4.3 cm fluid collection with  air-fluid level is noted in this area concerning for contained perforation or developing abscess. Mild small bowel dilatation is noted which may represent ileus or possibly obstruction. Vascular/Lymphatic: No significant vascular findings are present. No enlarged abdominal or pelvic lymph nodes. Reproductive: Prostate is unremarkable. Other: Mild fat containing left inguinal hernia is noted. Musculoskeletal: Status post surgical posterior fusion of L2-3, L3-4 and L4-5. No acute  osseous abnormality is noted. IMPRESSION: Proximal sigmoid diverticulitis is noted with a large amount of free air in the adjacent peritoneal fat and extending in the epigastric region consistent with perforation. 5.2 x 4.3 cm fluid collection with air-fluid level is noted in this area concerning for contained perforation or developing abscess. Mild small bowel dilatation is noted which may represent ileus or possibly obstruction. Critical Value/emergent results were called by telephone at the time of interpretation on 06/15/2018 at 3:13 pm to Dr. Reece Agar , who verbally acknowledged these results. Electronically Signed   By: Marijo Conception, M.D.   On: 06/15/2018 15:14    Anti-infectives: Anti-infectives (From admission, onward)   Start     Dose/Rate Route Frequency Ordered Stop   06/15/18 2359  metroNIDAZOLE (FLAGYL) IVPB 500 mg  Status:  Discontinued     500 mg 100 mL/hr over 60 Minutes Intravenous Every 8 hours 06/15/18 1655 06/16/18 0934   06/15/18 2300  piperacillin-tazobactam (ZOSYN) IVPB 3.375 g  Status:  Discontinued     3.375 g 12.5 mL/hr over 240 Minutes Intravenous Every 8 hours 06/15/18 2256 06/15/18 2310   06/15/18 2200  piperacillin-tazobactam (ZOSYN) IVPB 3.375 g     3.375 g 12.5 mL/hr over 240 Minutes Intravenous Every 8 hours 06/15/18 1743     06/15/18 1445  ceFEPIme (MAXIPIME) 2 g in sodium chloride 0.9 % 100 mL IVPB     2 g 200 mL/hr over 30 Minutes Intravenous  Once 06/15/18 1431 06/15/18 1606   06/15/18 1445  metroNIDAZOLE (FLAGYL) IVPB 500 mg     500 mg 100 mL/hr over 60 Minutes Intravenous  Once 06/15/18 1431 06/15/18 1711       Assessment/Plan Afib RVR - on cardizem and metoprolol, hold eliquis Interstitial lung disease - on chronic prednisone HTN Transaminitis/hyperbilirubinemia - hepatitis panel negative, repeat LFTs in AM  Perforated sigmoid colon S/p EXPLORATORY LAPAROTOMYWITHSIGMOID COLON RESECTIONAND END COLOSTOMY([facility]) 2/2 Dr. Marlou Starks - POD  2 - surgical pathology pending - daily wet to dry dressing changes, will plan for vac once there is no bleeding  ID - zosyn/ 2/2>>day#3 (needs 5 days minimum IV abx), flagyl 2/2>>2/3 FEN - IVF, NPO/NGT VTE - SCDs, sq heparin Foley - d/c today Follow up - Dr. Marlou Starks  Plan - Persistent ileus, continue NPO/NG tube and await return in bowel function. D/c foley today. Continue PT/OT. Increase/schedule robaxin for better pain control; continue IV tylenol. Ok to start IV heparin today.   LOS: 2 days    Wellington Hampshire , [facility] 06/17/2018, 7:40 AM Pager: [phone] Mon-Thurs 7:00 am-4:30 pm Fri 7:00 am -11:30 AM Sat-Sun 7:00 am-11:30 am

## 2018-06-17 NOTE — Consult Note (Addendum)
[place] Nurse wound consult note Reason for Consult: Surgical team is following for assessment and plan of care for abd wound.  Requested to apply Vac. Wound type: Full thickness post-op midline abd wound Measurement: 15X3X2cm Wound bed: beefy red Drainage (amount, consistency, odor) small amt pink drainage, no odor Periwound: intact skin surrounding Dressing procedure/placement/frequency: Applied one piece black foam dressing to 180mm cont suction.  Pt was medicated for pain prior to the procedure and tolerated with mod amt discomfort WOC will plan to change dressing again on FRI with next pouch change and teaching session.   [place] Nurse ostomy follow up Stoma type/location: Stoma is red and viable, flush with skin level, 1 1/2 inches.  Current pouch is leaking behind the barrier. Peristomal assessment:  Intact skin surrounding Output: small amt pink drainage; no stool or flatus Ostomy pouching: 1pc.  Education provided: Demonstrated pouch change using one piece pouch and added barrier ring to attempt to maintain a seal.  Pt was able to open and close velcro to empty.  Wife at the bedside to watch the procedure and both asked appropriate questions.  Will perform another pouch change demonstration on Fri with next Vac change. Enrolled patient in [place] Start Discharge program: No Julien Girt MSN, [place], [place] Sam, [place]

## 2018-06-17 NOTE — Progress Notes (Signed)
Physical Therapy Treatment Patient Details Name: [name] MRN: [id number] DOB: 1945-09-12 Today's Date: 06/17/2018    History of Present Illness 73 year old male with a history of interstitial lung disease, A. 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 Dx of bowel perforation, s/p colectomy/colostomy on 06/15/18.    PT Comments    Pt ambulated 120' with RW, HR 107 while walking, no loss of balance. Mod assist for sit to stand from recliner. Pt denied pain while ambulating but had significant abdominal pain with sit to supine. Instructed pt in ankle pumps and quad sets for independent strengthening exercise. Good progress with mobility today.    Follow Up Recommendations  No PT follow up     Equipment Recommendations  Rolling walker with 5" wheels    Recommendations for Other Services       Precautions / Restrictions Precautions Precautions: Other (comment) Precaution Comments: abdominal surgery Restrictions Weight Bearing Restrictions: No    Mobility  Bed Mobility Overal bed mobility: Needs Assistance Bed Mobility: Sit to Supine       Sit to supine: Min assist   General bed mobility comments: min assist for BLEs into bed; pt was up on edge of bed at start of PT session  Transfers Overall transfer level: Needs assistance Equipment used: Rolling walker (2 wheeled) Transfers: Sit to/from Stand Sit to Stand: Mod assist         General transfer comment: VCs hand placement, mod A to rise from recliner, min A to rise from elevated bed  Ambulation/Gait Ambulation/Gait assistance: Min guard Gait Distance (Feet): 120 Feet Assistive device: Rolling walker (2 wheeled) Gait Pattern/deviations: Step-through pattern;Decreased stride length Gait velocity: decr   General Gait Details: HR 107 walking, no loss of balance   Stairs             Wheelchair Mobility    Modified Rankin  (Stroke Patients Only)       Balance Overall balance assessment: Modified Independent                                          Cognition Arousal/Alertness: Awake/alert Behavior During Therapy: WFL for tasks assessed/performed Overall Cognitive Status: Within Functional Limits for tasks assessed                                        Exercises General Exercises - Lower Extremity Ankle Circles/Pumps: AROM;Both;10 reps;Supine Quad Sets: AROM;Both;5 reps;Supine    General Comments        Pertinent Vitals/Pain Pain Assessment: No/denies pain Pain Score: 7  Pain Location: no pain at rest nor with walking, 7/10 pain with sit to supine Pain Descriptors / Indicators: Sore Pain Intervention(s): Limited activity within patient's tolerance;Monitored during session;Premedicated before session;Repositioned    Home Living                      Prior Function            PT Goals (current goals can now be found in the care plan section) Acute Rehab PT Goals Patient Stated Goal: return to independence with mobility PT Goal Formulation: With patient/family Time For Goal Achievement: 06/30/18 Potential to Achieve Goals: Good Progress towards PT goals: Progressing  toward goals    Frequency    Min 3X/week      PT Plan Current plan remains appropriate    Co-evaluation              AM-PAC PT "6 Clicks" Mobility   Outcome Measure  Help needed turning from your back to your side while in a flat bed without using bedrails?: A Little Help needed moving from lying on your back to sitting on the side of a flat bed without using bedrails?: A Little Help needed moving to and from a bed to a chair (including a wheelchair)?: A Little Help needed standing up from a chair using your arms (e.g., wheelchair or bedside chair)?: A Lot Help needed to walk in hospital room?: A Little Help needed climbing 3-5 steps with a railing? : A Little 6  Click Score: 17    End of Session Equipment Utilized During Treatment: Gait belt Activity Tolerance: Patient limited by pain;Patient tolerated treatment well Patient left: in bed;with call bell/phone within reach;with family/visitor present Nurse Communication: Mobility status PT Visit Diagnosis: Pain;Difficulty in walking, not elsewhere classified (R26.2)     Time: 1884-1660 PT Time Calculation (min) (ACUTE ONLY): 27 min  Charges:  $Gait Training: 8-22 mins $Therapeutic Activity: 8-22 mins                     Blondell Reveal Kistler PT 06/17/2018  Acute Rehabilitation Services Pager [phone] Office [phone]

## 2018-06-17 NOTE — Progress Notes (Signed)
ANTICOAGULATION CONSULT NOTE - Initial Consult  Pharmacy Consult for Heparin Indication: atrial fibrillation (PTA Eliquis on hold)  Allergies  Allergen Reactions  . Lidocaine Hcl Anaphylaxis and Other (See Comments)    Xylocaine  . Xylocaine [Lidocaine] Anaphylaxis  . Codeine Nausea And Vomiting  . Crestor [Rosuvastatin Calcium] Other (See Comments)    All-over body aches Myalgias   . Statins Other (See Comments)    Muscle soreness and an aching feeling all over Myalgias  . Percocet [Oxycodone-Acetaminophen] Itching    Patient Measurements: Height: 5\' 9"  (175.3 cm) Weight: 182 lb 15.7 oz (83 kg) IBW/kg (Calculated) : 70.7 Heparin Dosing Weight: TBW  Vital Signs: Temp: 97.5 F (36.4 C) (02/04 0720) Temp Source: Axillary (02/04 0720) BP: 135/83 (02/04 0600) Pulse Rate: 97 (02/04 0600)  Labs: Recent Labs    06/15/18 1355 06/15/18 2006 06/16/18 0336 06/17/18 0242  HGB 16.0 11.2* 13.5 12.3*  HCT 46.4 33.0* 40.3 37.3*  PLT 186  --  195 196  CREATININE 1.01  --  0.75 0.68    Estimated Creatinine Clearance: 85.9 mL/min (by C-G formula based on SCr of 0.68 mg/dL).   Medical History: Past Medical History:  Diagnosis Date  . Arthritis   . Chest pain    a. 03/2017: echo showing EF of 60-65%, no regional WMA or significant valve abnormalities. b. 03/2016: NST with no evidence of ischemia.   . Digestive problems    on Prednisone prn for this issue- diarrhea  . Dyspnea    with activity  . Dysrhythmia    irregular due to Myocarditis- takes Atenolol, Norvasc  . GERD (gastroesophageal reflux disease)   . H/O viral myocarditis    25 years ago  . Head injury, closed, with concussion    Breif LOC  . Hyperlipidemia   . Mild mitral valve prolapse    per Dr Evette Georges notes  . PAF (paroxysmal atrial fibrillation) ([place])    a. initially occuring in 02/2016. b. recurrent in 07/2016. Placed on Eliquis  . Polymyalgia rheumatica ([place])   . Pre-diabetes   . Sleep apnea    mild  sleep apnea   no CPAP    Medications:  Scheduled:  . albuterol  2.5 mg Nebulization TID  . heparin  5,000 Units Subcutaneous Q8H  . HYDROmorphone   Intravenous Q4H  . lip balm  1 application Topical BID  . methylPREDNISolone (SOLU-MEDROL) injection  20 mg Intravenous Daily  . metoprolol tartrate  5 mg Intravenous Q8H  . mometasone-formoterol  2 puff Inhalation BID  . pantoprazole (PROTONIX) IV  40 mg Intravenous QHS   Infusions:  . sodium chloride 50 mL/hr (06/17/18 0952)  . methocarbamol (ROBAXIN) IV    . ondansetron (ZOFRAN) IV    . piperacillin-tazobactam (ZOSYN)  IV 3.375 g (06/17/18 0263)    Assessment: 25 yoM admitted on 2/2 for sigmoid colon perforation s/p [facility] colectomy/colostomy 06/15/2018 with ileus.  PMH significant for Afib on Eliquis prior to admission;  Eliquis was held and [facility] given on 2/2 prior to emergent surgery.  Pharmacy is now consulted to dose Heparin IV while NPO and unable to take Eliquis.  CCS ok with starting Heparin today as well.  PTA Eliquis 5mg  BID. Last dose on 2/2 at 0800.  Today, 06/17/2018: Baseline coags pending.  HL and APTT. CBC: Hgb 12.3, Plt 196 No bleeding or complications reported. SCr 0.68, stable  Goal of Therapy:  Heparin level 0.3-0.7 units/ml Monitor platelets by anticoagulation protocol: Yes   Plan:  D/C Heparin  SQ Give heparin 4000 units bolus IV x 1 Start heparin IV infusion at 1200 units/hr Heparin level 8 hours after starting Daily heparin level and CBC Continue to monitor H&H and platelets   Gretta Arab PharmD, BCPS Pager [phone] 06/17/2018 11:18 AM

## 2018-06-17 NOTE — Progress Notes (Signed)
OT Cancellation Note  Patient Details Name: [name] MRN: [id number] DOB: 16-May-1945   Cancelled Treatment:    Reason Eval/Treat Not Completed: Other (comment); pt transferring to new unit - will follow up for OT eval as able.   Lou Cal, OT Supplemental Rehabilitation Services Pager [phone] Office [phone]   Raymondo Band 06/17/2018, 12:18 PM

## 2018-06-17 NOTE — Evaluation (Addendum)
Occupational Therapy Evaluation Patient Details Name: [name] MRN: [id number] DOB: August 30, 1945 Today's Date: 06/17/2018    History of Present Illness 73 year old male with a history of interstitial lung disease, A. 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 Dx of bowel perforation, s/p colectomy/colostomy on 06/15/18.   Clinical Impression   This 73 y/o male presents with the above. At baseline pt reports he is independent with ADL and mobility. Pt mostly limited due to abdominal pain, decreased activity tolerance. Pt with increased fatigue this PM (pt up and ambulating earlier today, just recently transitioned to new floor/unit) and requesting to defer OOB activity during this session. Pt currently requires minA for UB ADL, max-totalA for LB ADL. Pt's spouse present and supportive throughout and reports is able to assist with ADL needs after return home. He will benefit from continued acute OT services prior to discharge to maximize his overall safety and independence with ADL and mobility. Will follow.     Follow Up Recommendations  Supervision/Assistance - 24 hour    Equipment Recommendations  None recommended by OT           Precautions / Restrictions Precautions Precautions: Other (comment) Precaution Comments: abdominal surgery, wound vac Restrictions Weight Bearing Restrictions: No                                                    ADL either performed or assessed with clinical judgement   ADL Overall ADL's : Needs assistance/impaired Eating/Feeding: NPO   Grooming: Set up;Sitting   Upper Body Bathing: Min guard;Minimal assistance;Bed level;Sitting       Upper Body Dressing : Min guard;Minimal assistance;Sitting;Bed level     Lower Body Dressing Details (indicate cue type and reason): educated on compensatory strategies for performing LB ADL including use of  figure 4 technique and use of AE               General ADL Comments: pt limited this afternoon due to pain/fatigue, declined activity beyond bed level; initiated education re: compensatory strategies for performing ADL and functional transfers, activity progression and energy conservation                          Pertinent Vitals/Pain Pain Assessment: 0-10 Pain Location: reports no pain at rest, however reports pain can increase up to 28/36 with certain movements Pain Descriptors / Indicators: Sore Pain Intervention(s): Monitored during session     Hand Dominance     Extremity/Trunk Assessment Upper Extremity Assessment Upper Extremity Assessment: Overall WFL for tasks assessed   Lower Extremity Assessment Lower Extremity Assessment: Defer to PT evaluation       Communication Communication Communication: No difficulties   Cognition Arousal/Alertness: Awake/alert Behavior During Therapy: WFL for tasks assessed/performed Overall Cognitive Status: Within Functional Limits for tasks assessed                                     General Comments  spouse present during session               Home Living Family/patient expects to be discharged to:: Private residence Living Arrangements: Spouse/significant other Available Help at Discharge:  Family Type of Home: House Home Access: Stairs to enter Technical brewer of Steps: 1 Entrance Stairs-Rails: None Home Layout: One level     Bathroom Shower/Tub: Teacher, early years/pre: Standard     Home Equipment: Bedside commode   Additional Comments: pt reports plans to rennovate bathroom in upcoming weeks, will then have walk-in shower vs tub       Prior Functioning/Environment Level of Independence: Independent                 OT Problem List: Decreased strength;Decreased range of motion;Decreased activity tolerance;Impaired balance (sitting and/or  standing);Pain;Decreased knowledge of use of DME or AE      OT Treatment/Interventions: Self-care/ADL training;Therapeutic exercise;Energy conservation;Therapeutic activities;Patient/family education;Balance training;DME and/or AE instruction    OT Goals(Current goals can be found in the care plan section) Acute Rehab OT Goals Patient Stated Goal: return to independence with mobility OT Goal Formulation: With patient Time For Goal Achievement: 07/01/18 Potential to Achieve Goals: Good  OT Frequency: Min 2X/week   Barriers to D/C:            Co-evaluation              AM-PAC OT "6 Clicks" Daily Activity     Outcome Measure Help from another person eating meals?: Total(NPO) Help from another person taking care of personal grooming?: A Little Help from another person toileting, which includes using toliet, bedpan, or urinal?: A Lot Help from another person bathing (including washing, rinsing, drying)?: A Lot Help from another person to put on and taking off regular upper body clothing?: A Lot Help from another person to put on and taking off regular lower body clothing?: A Lot 6 Click Score: 12   End of Session Nurse Communication: Mobility status  Activity Tolerance: Patient limited by fatigue Patient left: in bed;with call bell/phone within reach;with bed alarm set;with family/visitor present  OT Visit Diagnosis: Muscle weakness (generalized) (M62.81);Pain Pain - part of body: (abdomen)                Time: 1638-4536 OT Time Calculation (min): 23 min Charges:  OT General Charges $OT Visit: 1 Visit OT Evaluation $OT Eval Moderate Complexity: 1 Mod  [name], OT [employer] Pager [phone] Office [phone]   [name] 06/17/2018, 4:26 PM

## 2018-06-17 NOTE — Telephone Encounter (Signed)
Reviewing PA folder -  Phone note from 06/13/2018 states there was confusion about the approval for Cellcept. Called [facility] at [phone] and spoke to [facility] and was advised there were two Cellcept rx (250mg  and another 500mg ). Per pt's chart there should only be a 500mg  tabs. Seleya correct the account and pt should not run into any issues with filling the medication. The episode #: [id number], in case the call needs to be referenced.   LMTCB for pt to advise him to call if there are any issues with refilling his Cellcept and to see if he is still taking Ofev (phone note from 1.31.2020 and 1.27.2020 states 'need to check ofev'). OV from 1.17.2020 state to continue the Ofev, no subsequent telephone encounter say anything about a PA or issues with Ofev.   Will forward to [place] to follow up on.

## 2018-06-17 NOTE — Progress Notes (Signed)
ANTICOAGULATION CONSULT NOTE - Initial Consult  Pharmacy Consult for Heparin Indication: atrial fibrillation (PTA Eliquis on hold)  Allergies  Allergen Reactions  . Lidocaine Hcl Anaphylaxis and Other (See Comments)    Xylocaine  . Xylocaine [Lidocaine] Anaphylaxis  . Codeine Nausea And Vomiting  . Crestor [Rosuvastatin Calcium] Other (See Comments)    All-over body aches Myalgias   . Statins Other (See Comments)    Muscle soreness and an aching feeling all over Myalgias  . Percocet [Oxycodone-Acetaminophen] Itching    Patient Measurements: Height: 5\' 9"  (175.3 cm) Weight: 182 lb 15.7 oz (83 kg) IBW/kg (Calculated) : 70.7 Heparin Dosing Weight: TBW  Vital Signs: Temp: 97.9 F (36.6 C) (02/04 2047) Temp Source: Oral (02/04 2047) BP: 140/86 (02/04 2047) Pulse Rate: 92 (02/04 2047)  Labs: Recent Labs    06/15/18 1355 06/15/18 2006 06/16/18 0336 06/17/18 0242 06/17/18 1141 06/17/18 1142 06/17/18 2138  HGB 16.0 11.2* 13.5 12.3*  --   --   --   HCT 46.4 33.0* 40.3 37.3*  --   --   --   PLT 186  --  195 196  --   --   --   APTT  --   --   --   --   --  28 65*  HEPARINUNFRC  --   --   --   --  0.13*  --  0.52  CREATININE 1.01  --  0.75 0.68  --   --   --     Estimated Creatinine Clearance: 85.9 mL/min (by C-G formula based on SCr of 0.68 mg/dL).   Medical History: Past Medical History:  Diagnosis Date  . Arthritis   . Chest pain    a. 03/2017: echo showing EF of 60-65%, no regional WMA or significant valve abnormalities. b. 03/2016: NST with no evidence of ischemia.   . Digestive problems    on Prednisone prn for this issue- diarrhea  . Dyspnea    with activity  . Dysrhythmia    irregular due to Myocarditis- takes Atenolol, Norvasc  . GERD (gastroesophageal reflux disease)   . H/O viral myocarditis    25 years ago  . Head injury, closed, with concussion    Breif LOC  . Hyperlipidemia   . Mild mitral valve prolapse    per Dr Evette Georges notes  . PAF  (paroxysmal atrial fibrillation) ([place])    a. initially occuring in 02/2016. b. recurrent in 07/2016. Placed on Eliquis  . Polymyalgia rheumatica ([place])   . Pre-diabetes   . Sleep apnea    mild sleep apnea   no CPAP    Medications:  Scheduled:  . albuterol  2.5 mg Nebulization TID  . lip balm  1 application Topical BID  . methylPREDNISolone (SOLU-MEDROL) injection  20 mg Intravenous Daily  . metoprolol tartrate  5 mg Intravenous Q8H  . mometasone-formoterol  2 puff Inhalation BID  . pantoprazole (PROTONIX) IV  40 mg Intravenous QHS   Infusions:  . sodium chloride 50 mL/hr at 06/17/18 1345  . acetaminophen 1,000 mg (06/17/18 1951)  . heparin 1,200 Units/hr (06/17/18 1331)  . methocarbamol (ROBAXIN) IV 1,000 mg (06/17/18 1545)  . ondansetron (ZOFRAN) IV    . piperacillin-tazobactam (ZOSYN)  IV Stopped (06/17/18 1707)    Assessment: 71 yoM admitted on 2/2 for sigmoid colon perforation s/p Hartman colectomy/colostomy 06/15/2018 with ileus.  PMH significant for Afib on Eliquis prior to admission;  Eliquis was held and [facility] given on 2/2 prior to emergent  surgery.  Pharmacy is now consulted to dose Heparin IV while NPO and unable to take Eliquis.  CCS ok with starting Heparin today as well.  PTA Eliquis 5mg  BID. Last dose on 2/2 at 0800.  Today, 06/17/2018: Baseline coags pending.  HL and APTT. CBC: Hgb 12.3, Plt 196 No bleeding or complications reported. SCr 0.68, stable  PM 06/17/18 10:01 PM  HL at goal, aPTT near goal No bleeding reported  Goal of Therapy:  Heparin level 0.3-0.7 units/ml Monitor platelets by anticoagulation protocol: Yes   Plan:  Continue heparin infusion at current rate of 1200 units/hr Will recheck HL in 6 hours Daily CBC  Ulice Dash, PharmD Clinical Pharmacist Pager # [phone]  06/17/2018 10:01 PM

## 2018-06-17 NOTE — Progress Notes (Signed)
[facility]                                                                              Patient Demographics  [name], is a 73 y.o. male, DOB - [birth year], YDX:[id number]  Admit date - 06/15/2018   Admitting Physician Elmarie Shiley, MD  Outpatient Primary MD for the patient is [name], MD  Outpatient specialists:   LOS - 2  days   Medical records reviewed and are as summarized below:    Chief Complaint  Patient presents with  . Abdominal Pain       Brief summary   Patient is 73 year old male with a history of interstitial lung disease on prednisone, CellCept, and nintedanib, A. 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  No nausea vomiting fevers.  He does have chronic shortness of breath due to ILD. CT abdomen pelvis showed proximal sigmoid diverticulitis with large amount of free air in the adjacent peritoneal fat and extending in the epigastric region consistent with perforation.  5.2x 4.3 cm fluid collection with air-fluid levels in the area concerning for contained perforation or developing abscess.  Mild small bowel dilatation may represent ileus or possibly obstruction.   Assessment & Plan    Principal Problem: Acute sigmoid diverticulitis with perforation s/p Hartmann colectomy/colostomy 06/15/2018 with ileus -Patient placed on broad-spectrum antibiotics, surgery was consulted, Eliquis held -Underwent exploratory laparotomy with sigmoid colon resection and end colostomy -Postop day #2, management per general surgery -Continues to complain of abdominal pain, started on Dilaudid PCA, wound vac to be placed today  Active Problems:   Abnormal LFTs -In the setting of acute diverticulitis, perforation -Acute hepatitis panel negative, follow LFTs    Essential hypertension -Continue IV metoprolol, cont  hydralazine as needed with parameters    ILD (interstitial lung disease) (HCC) Continue to hold CellCept, nintedanib, has been on prednisone for many years -Continue low-dose Solu-Medrol to avoid adrenal insufficiency  -Continue Symbicort, nebulizer treatments     PAF (paroxysmal atrial fibrillation) (HCC) -Continue IV Lopressor, continue to hold Eliquis - will restart heparin drip  Code Status: Full CODE STATUS DVT Prophylaxis: Heparin  Family Communication: Discussed in detail with the patient, all imaging results, lab results explained to the patient    Disposition Plan: Discussed with surgery, Dr. Johney Maine, okay to transfer to floor  Time Spent in minutes  25 minutes  Procedures:  Expiratory laparotomy with sigmoid colon resection and colostomy  Consultants:   General surgery*  Antimicrobials:   Anti-infectives (From admission, onward)   Start     Dose/Rate Route Frequency Ordered Stop   06/15/18 2359  metroNIDAZOLE (FLAGYL) IVPB 500 mg  Status:  Discontinued     500 mg 100 mL/hr over 60 Minutes Intravenous Every 8 hours 06/15/18 1655 06/16/18 0934   06/15/18 2300  piperacillin-tazobactam (ZOSYN) IVPB 3.375 g  Status:  Discontinued     3.375 g 12.5 mL/hr over 240 Minutes Intravenous Every 8 hours 06/15/18 2256 06/15/18 2310   06/15/18 2200  piperacillin-tazobactam (ZOSYN)  IVPB 3.375 g     3.375 g 12.5 mL/hr over 240 Minutes Intravenous Every 8 hours 06/15/18 1743     06/15/18 1445  ceFEPIme (MAXIPIME) 2 g in sodium chloride 0.9 % 100 mL IVPB     2 g 200 mL/hr over 30 Minutes Intravenous  Once 06/15/18 1431 06/15/18 1606   06/15/18 1445  metroNIDAZOLE (FLAGYL) IVPB 500 mg     500 mg 100 mL/hr over 60 Minutes Intravenous  Once 06/15/18 1431 06/15/18 1711         Medications  Scheduled Meds: . albuterol  2.5 mg Nebulization TID  . heparin  5,000 Units Subcutaneous Q8H  . HYDROmorphone   Intravenous Q4H  . lip balm  1 application Topical BID  .  methylPREDNISolone (SOLU-MEDROL) injection  20 mg Intravenous Daily  . metoprolol tartrate  5 mg Intravenous Q8H  . mometasone-formoterol  2 puff Inhalation BID  . pantoprazole (PROTONIX) IV  40 mg Intravenous QHS   Continuous Infusions: . sodium chloride 50 mL/hr (06/17/18 0952)  . methocarbamol (ROBAXIN) IV    . ondansetron (ZOFRAN) IV    . piperacillin-tazobactam (ZOSYN)  IV 3.375 g (06/17/18 0620)   PRN Meds:.alum & mag hydroxide-simeth, diphenhydrAMINE **OR** diphenhydrAMINE, fluticasone, guaiFENesin-dextromethorphan, hydrALAZINE, hydrocortisone, hydrocortisone cream, magic mouthwash, menthol-cetylpyridinium, metoprolol tartrate, naloxone **AND** sodium chloride flush, ondansetron (ZOFRAN) IV **OR** ondansetron (ZOFRAN) IV, ondansetron (ZOFRAN) IV, phenol, prochlorperazine      Subjective:   [name] was seen and examined today.  Continues to complain of lower abdominal pain, 9/10, no nausea or vomiting, still no output from ostomy.  No fevers.  Patient denies dizziness, chest pain, shortness of breath.    Objective:   Vitals:   06/17/18 0500 06/17/18 0600 06/17/18 0720 06/17/18 0842  BP: 134/86 135/83    Pulse: 96 97    Resp:  (!) 9    Temp:   (!) 97.5 F (36.4 C)   TempSrc:   Axillary   SpO2: 92% 91%  95%  Weight:      Height:        Intake/Output Summary (Last 24 hours) at 06/17/2018 1045 Last data filed at 06/17/2018 0554 Gross per 24 hour  Intake 1787.03 ml  Output 2400 ml  Net -612.97 ml     Wt Readings from Last 3 Encounters:  06/15/18 83 kg  05/30/18 84.2 kg  05/02/18 85.3 kg   Physical Exam  General: Alert and oriented x 3, NAD, pleasant  Eyes:   HEENT:    Cardiovascular: S1 S2 clear RRR. No pedal edema b/l  Respiratory: CTAB, no wheezing, rales or rhonchi  Gastrointestinal: Soft, mild distention, dressing intact, hypoactive bowel sounds, ostomy pink, no air or stool  Ext: no pedal edema bilaterally  Neuro: no new  deficits  Musculoskeletal: No cyanosis, clubbing  Skin: No rashes  Psych: Normal affect and demeanor, alert and oriented x3      Data Reviewed:  I have personally reviewed following labs and imaging studies  Micro Results Recent Results (from the past 240 hour(s))  Aerobic/Anaerobic Culture (surgical/deep wound)     Status: None (Preliminary result)   Collection Time: 06/15/18  8:17 PM  Result Value Ref Range Status   Specimen Description   Final    ABSCESS Performed at [facility] [address], [place], [place] [address]    Special Requests   Final    NONE Performed at [facility], [place] [address] [place], [place] [address]  Gram Stain   Final    MODERATE WBC PRESENT, PREDOMINANTLY PMN FEW GRAM POSITIVE COCCI FEW GRAM POSITIVE RODS    Culture   Final    FEW GRAM NEGATIVE RODS CULTURE REINCUBATED FOR BETTER GROWTH Performed at [facility], [place] [address] [place], [place] [address]    Report Status PENDING  Incomplete  MRSA PCR Screening     Status: None   Collection Time: 06/15/18 10:22 PM  Result Value Ref Range Status   MRSA by PCR NEGATIVE NEGATIVE Final    Comment:        The GeneXpert MRSA Assay (FDA approved for NASAL specimens only), is one component of a comprehensive MRSA colonization surveillance program. It is not intended to diagnose MRSA infection nor to guide or monitor treatment for MRSA infections. Performed at [facility], [place] [address] [place], [place] [address]     Radiology Reports Ct Abdomen Pelvis W Contrast  Result Date: 06/15/2018 CLINICAL DATA:  Acute generalized abdominal pain. EXAM: CT ABDOMEN AND PELVIS WITH CONTRAST TECHNIQUE: Multidetector CT imaging of the abdomen and pelvis was performed using the standard protocol following bolus administration of intravenous contrast. CONTRAST:  159mL ISOVUE-300 IOPAMIDOL (ISOVUE-300) INJECTION 61% COMPARISON:  CT scan  of Sep 22, 2012. FINDINGS: Lower chest: No acute abnormality. Hepatobiliary: No focal liver abnormality is seen. Status post cholecystectomy. No biliary dilatation. Pancreas: Unremarkable. No pancreatic ductal dilatation or surrounding inflammatory changes. Spleen: Normal in size without focal abnormality. Adrenals/Urinary Tract: Adrenal glands are unremarkable. Kidneys are normal, without renal calculi, focal lesion, or hydronephrosis. Bladder is unremarkable. Stomach/Bowel: The stomach appears normal. Status post appendectomy. Proximal sigmoid diverticulitis is noted with a large amount of free air in the adjacent peritoneal fat and extending into the epigastric region consistent with perforation. 5.2 x 4.3 cm fluid collection with air-fluid level is noted in this area concerning for contained perforation or developing abscess. Mild small bowel dilatation is noted which may represent ileus or possibly obstruction. Vascular/Lymphatic: No significant vascular findings are present. No enlarged abdominal or pelvic lymph nodes. Reproductive: Prostate is unremarkable. Other: Mild fat containing left inguinal hernia is noted. Musculoskeletal: Status post surgical posterior fusion of L2-3, L3-4 and L4-5. No acute osseous abnormality is noted. IMPRESSION: Proximal sigmoid diverticulitis is noted with a large amount of free air in the adjacent peritoneal fat and extending in the epigastric region consistent with perforation. 5.2 x 4.3 cm fluid collection with air-fluid level is noted in this area concerning for contained perforation or developing abscess. Mild small bowel dilatation is noted which may represent ileus or possibly obstruction. Critical Value/emergent results were called by telephone at the time of interpretation on 06/15/2018 at 3:13 pm to Dr. Reece Agar , who verbally acknowledged these results. Electronically Signed   By: Marijo Conception, M.D.   On: 06/15/2018 15:14    Lab Data:  CBC: Recent Labs   Lab 06/15/18 1355 06/15/18 2006 06/16/18 0336 06/17/18 0242  WBC 9.6  --  12.5* 14.8*  HGB 16.0 11.2* 13.5 12.3*  HCT 46.4 33.0* 40.3 37.3*  MCV 105.9*  --  108.0* 109.7*  PLT 186  --  195 259   Basic Metabolic Panel: Recent Labs  Lab 06/15/18 1355 06/15/18 2006 06/16/18 0336 06/17/18 0242  NA 136 135 136 137  K 3.9 4.5 4.6 3.9  CL 102  --  107 106  CO2 24  --  22 22  GLUCOSE 138* 170* 207* 204*  BUN 13  --  8 12  CREATININE 1.01  --  0.75 0.68  CALCIUM 8.4*  --  6.9* 7.2*   GFR: Estimated Creatinine Clearance: 85.9 mL/min (by C-G formula based on SCr of 0.68 mg/dL). Liver Function Tests: Recent Labs  Lab 06/15/18 1355 06/16/18 0336  AST 48* 70*  ALT 108* 106*  ALKPHOS 124 90  BILITOT 1.6* 1.3*  PROT 6.5 5.3*  ALBUMIN 3.0* 2.1*   Recent Labs  Lab 06/15/18 1355  LIPASE 29   No results for input(s): AMMONIA in the last 168 hours. Coagulation Profile: No results for input(s): INR, PROTIME in the last 168 hours. Cardiac Enzymes: No results for input(s): CKTOTAL, CKMB, CKMBINDEX, TROPONINI in the last 168 hours. BNP (last 3 results) No results for input(s): PROBNP in the last 8760 hours. HbA1C: No results for input(s): HGBA1C in the last 72 hours. CBG: No results for input(s): GLUCAP in the last 168 hours. Lipid Profile: No results for input(s): CHOL, HDL, LDLCALC, TRIG, CHOLHDL, LDLDIRECT in the last 72 hours. Thyroid Function Tests: No results for input(s): TSH, T4TOTAL, FREET4, T3FREE, THYROIDAB in the last 72 hours. Anemia Panel: No results for input(s): VITAMINB12, FOLATE, FERRITIN, TIBC, IRON, RETICCTPCT in the last 72 hours. Urine analysis:    Component Value Date/Time   COLORURINE YELLOW 02/17/2018 1404   APPEARANCEUR CLEAR 02/17/2018 1404   LABSPEC 1.020 02/17/2018 1404   PHURINE 5.0 02/17/2018 1404   GLUCOSEU NEGATIVE 02/17/2018 1404   HGBUR NEGATIVE 02/17/2018 1404   BILIRUBINUR NEGATIVE 02/17/2018 1404   KETONESUR NEGATIVE 02/17/2018  1404   PROTEINUR NEGATIVE 02/17/2018 1404   UROBILINOGEN 0.2 01/22/2007 0720   NITRITE NEGATIVE 02/17/2018 1404   LEUKOCYTESUR NEGATIVE 02/17/2018 1404     Ripudeep Rai M.D. [facility] 06/17/2018, 10:45 AM  Pager: [phone] Between 7am to 7pm - call Pager - [phone]  After 7pm go to [email or web address] - password [email or web address]  Call night coverage person covering after 7pm

## 2018-06-18 LAB — COMPREHENSIVE METABOLIC PANEL
ALT: 59 U/L — ABNORMAL HIGH (ref 0–44)
AST: 40 U/L (ref 15–41)
Albumin: 2 g/dL — ABNORMAL LOW (ref 3.5–5.0)
Alkaline Phosphatase: 97 U/L (ref 38–126)
Anion gap: 8 (ref 5–15)
BUN: 12 mg/dL (ref 8–23)
CO2: 23 mmol/L (ref 22–32)
Calcium: 7.7 mg/dL — ABNORMAL LOW (ref 8.9–10.3)
Chloride: 108 mmol/L (ref 98–111)
Creatinine, Ser: 0.65 mg/dL (ref 0.61–1.24)
GFR calc Af Amer: >60 mL/min (ref >60)
GFR calc non Af Amer: >60 mL/min (ref >60)
Glucose, Bld: 106 mg/dL — ABNORMAL HIGH (ref 70–99)
Potassium: 4.5 mmol/L (ref 3.5–5.1)
Sodium: 139 mmol/L (ref 135–145)
Total Bilirubin: 0.7 mg/dL (ref 0.3–1.2)
Total Protein: 5.1 g/dL — ABNORMAL LOW (ref 6.5–8.1)

## 2018-06-18 LAB — CBC
HCT: 36.1 % — ABNORMAL LOW (ref 39.0–52.0)
Hemoglobin: 11.7 g/dL — ABNORMAL LOW (ref 13.0–17.0)
MCH: 35.6 pg — ABNORMAL HIGH (ref 26.0–34.0)
MCHC: 32.4 g/dL (ref 30.0–36.0)
MCV: 109.7 fL — ABNORMAL HIGH (ref 80.0–100.0)
Platelets: 219 10*3/uL (ref 150–400)
RBC: 3.29 MIL/uL — ABNORMAL LOW (ref 4.22–5.81)
RDW: 13.2 % (ref 11.5–15.5)
WBC: 12.9 10*3/uL — ABNORMAL HIGH (ref 4.0–10.5)
nRBC: 0 % (ref 0.0–0.2)

## 2018-06-18 LAB — AEROBIC/ANAEROBIC CULTURE (SURGICAL/DEEP WOUND)

## 2018-06-18 LAB — HEPARIN LEVEL (UNFRACTIONATED): Heparin Unfractionated: 0.4 IU/mL (ref 0.30–0.70)

## 2018-06-18 LAB — AEROBIC/ANAEROBIC CULTURE W GRAM STAIN (SURGICAL/DEEP WOUND)

## 2018-06-18 MED ORDER — ACETAMINOPHEN 10 MG/ML IV SOLN
1000.0000 mg | Freq: Four times a day (QID) | INTRAVENOUS | Status: AC
  Administered 2018-06-18 – 2018-06-19 (×4): 1000 mg via INTRAVENOUS
  Filled 2018-06-18 (×5): qty 100

## 2018-06-18 NOTE — Progress Notes (Signed)
PROGRESS NOTE  [name] JSE:[id number] DOB: 1945-09-18 DOA: 06/15/2018 PCP: [name], MD  HPI/Recap of past 24 hours: Patient is 73 year old male with a history of interstitial lung disease on prednisone, CellCept, and nintedanib,A. 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  No nausea vomiting fevers.  He does have chronic shortness of breath due to ILD. CT abdomen pelvis showed proximal sigmoid diverticulitis with large amount of free air in the adjacent peritoneal fat and extending in the epigastric region consistent with perforation.  5.2x 4.3 cm fluid collection with air-fluid levels in the area concerning for contained perforation or developing abscess.  Mild small bowel dilatation may represent ileus or possibly obstruction.  06/18/2018: Patient seen and examined with his wife at bedside.  No abdominal pain or nausea at the time of this visit.  Denies chest pain, palpitation or dyspnea at rest.   Assessment/Plan: Principal Problem:   Sigmoid colon perforation s/p Hartmann colectomy/colostomy 06/15/2018 Active Problems:   Abnormal LFTs   Essential hypertension   ILD (interstitial lung disease) (HCC)   PAF (paroxysmal atrial fibrillation) (HCC)   OSA (obstructive sleep apnea)  Acute sigmoid diverticulitis with perforation s/p Hartmann colectomy/colostomy 06/15/2018 with ileus -Patient placed on broad-spectrum antibiotics, surgery following, Eliquis held -Underwent exploratory laparotomy with sigmoid colon resection and end colostomy -Postop day #3, management per general surgery -Continue Zosyn -WBC is improving on current IV antibiotics -N.p.o.  Active Problems:   Abnormal LFTs -In the setting of acute diverticulitis, perforation -Acute hepatitis panel negative, follow LFTs    Essential hypertension -Continue IV metoprolol, cont  hydralazine as needed with parameters    ILD (interstitial lung disease) (HCC) N.p.o.  Continue to hold CellCept, nintedanib, has been on prednisone for many years -Continue low-dose Solu-Medrol to avoid adrenal insufficiency  -Continue Symbicort, nebulizer treatments    PAF (paroxysmal atrial fibrillation) (HCC) -Continue IV Lopressor, continue to hold Eliquis - Continue heparin drip  Code Status: Full CODE STATUS DVT Prophylaxis: Heparin drip Family Communication:  Wife at bedside.  All questions answered to her satisfaction. Disposition Plan: Home versus SNF in 1 to 2 days when hemodynamically stable.    Procedures:  Expiratory laparotomy with sigmoid colon resection and colostomy  Consultants:   General surgery    Objective: Vitals:   06/18/18 0427 06/18/18 0828 06/18/18 0903 06/18/18 1417  BP: (!) 160/97  (!) 152/87 (!) 158/93  Pulse: 91  97 97  Resp: 16   15  Temp: 98 F (36.7 C)  98.6 F (37 C) 99.5 F (37.5 C)  TempSrc: Oral  Oral Oral  SpO2: 95% 95% 96% 97%  Weight:      Height:        Intake/Output Summary (Last 24 hours) at 06/18/2018 1726 Last data filed at 06/18/2018 1609 Gross per 24 hour  Intake 2405.03 ml  Output 1825 ml  Net 580.03 ml   Filed Weights   06/15/18 1758  Weight: 83 kg    Exam:  . General: 73 y.o. year-old male well developed well nourished in no acute distress.  Alert and oriented x3. . Cardiovascular: Regular rate and rhythm with no rubs or gallops.  No thyromegaly or JVD noted.   Marland Kitchen Respiratory: Clear to auscultation with no wheezes or rales. Good inspiratory effort. . Abdomen: Soft nontender.  Ostomy pink.  Dressing appears intact.  Hypoactive bowel sounds. . Musculoskeletal:  No cyanosis or clubbing.  No pedal edema bilaterally. Marland Kitchen Psychiatry: Mood is appropriate for condition and setting   Data Reviewed: CBC: Recent Labs  Lab 06/15/18 1355 06/15/18 2006 06/16/18 0336 06/17/18 0242 06/18/18 0426  WBC 9.6  --   12.5* 14.8* 12.9*  HGB 16.0 11.2* 13.5 12.3* 11.7*  HCT 46.4 33.0* 40.3 37.3* 36.1*  MCV 105.9*  --  108.0* 109.7* 109.7*  PLT 186  --  195 196 176   Basic Metabolic Panel: Recent Labs  Lab 06/15/18 1355 06/15/18 2006 06/16/18 0336 06/17/18 0242 06/18/18 0426  NA 136 135 136 137 139  K 3.9 4.5 4.6 3.9 4.5  CL 102  --  107 106 108  CO2 24  --  22 22 23   GLUCOSE 138* 170* 207* 204* 106*  BUN 13  --  8 12 12   CREATININE 1.01  --  0.75 0.68 0.65  CALCIUM 8.4*  --  6.9* 7.2* 7.7*   GFR: Estimated Creatinine Clearance: 85.9 mL/min (by C-G formula based on SCr of 0.65 mg/dL). Liver Function Tests: Recent Labs  Lab 06/15/18 1355 06/16/18 0336 06/18/18 0426  AST 48* 70* 40  ALT 108* 106* 59*  ALKPHOS 124 90 97  BILITOT 1.6* 1.3* 0.7  PROT 6.5 5.3* 5.1*  ALBUMIN 3.0* 2.1* 2.0*   Recent Labs  Lab 06/15/18 1355  LIPASE 29   No results for input(s): AMMONIA in the last 168 hours. Coagulation Profile: No results for input(s): INR, PROTIME in the last 168 hours. Cardiac Enzymes: No results for input(s): CKTOTAL, CKMB, CKMBINDEX, TROPONINI in the last 168 hours. BNP (last 3 results) No results for input(s): PROBNP in the last 8760 hours. HbA1C: No results for input(s): HGBA1C in the last 72 hours. CBG: No results for input(s): GLUCAP in the last 168 hours. Lipid Profile: No results for input(s): CHOL, HDL, LDLCALC, TRIG, CHOLHDL, LDLDIRECT in the last 72 hours. Thyroid Function Tests: No results for input(s): TSH, T4TOTAL, FREET4, T3FREE, THYROIDAB in the last 72 hours. Anemia Panel: No results for input(s): VITAMINB12, FOLATE, FERRITIN, TIBC, IRON, RETICCTPCT in the last 72 hours. Urine analysis:    Component Value Date/Time   COLORURINE YELLOW 02/17/2018 1404   APPEARANCEUR CLEAR 02/17/2018 1404   LABSPEC 1.020 02/17/2018 1404   PHURINE 5.0 02/17/2018 1404   GLUCOSEU NEGATIVE 02/17/2018 1404   HGBUR NEGATIVE 02/17/2018 1404   BILIRUBINUR NEGATIVE 02/17/2018 1404    KETONESUR NEGATIVE 02/17/2018 1404   PROTEINUR NEGATIVE 02/17/2018 1404   UROBILINOGEN 0.2 01/22/2007 0720   NITRITE NEGATIVE 02/17/2018 1404   LEUKOCYTESUR NEGATIVE 02/17/2018 1404   Sepsis Labs: @LABRCNTIP (procalcitonin:4,lacticidven:4)  ) Recent Results (from the past 240 hour(s))  Aerobic/Anaerobic Culture (surgical/deep wound)     Status: None   Collection Time: 06/15/18  8:17 PM  Result Value Ref Range Status   Specimen Description   Final    ABSCESS Performed at [facility], [place] [address] [place], [place] [address]    Special Requests   Final    NONE Performed at [facility], [place] [address] [place], [place] [address]    Gram Stain   Final    MODERATE WBC PRESENT, PREDOMINANTLY PMN FEW GRAM POSITIVE COCCI FEW GRAM POSITIVE RODS Performed at [facility], [place] [address], [place],  [address]    Culture   Final    FEW ESCHERICHIA COLI MIXED ANAEROBIC FLORA PRESENT.  CALL LAB IF FURTHER IID REQUIRED.    Report Status 06/18/2018 FINAL  Final   Organism ID, Bacteria ESCHERICHIA COLI  Final      Susceptibility   Escherichia coli - MIC*    AMPICILLIN >=32 RESISTANT Resistant     CEFAZOLIN <=4 SENSITIVE Sensitive     CEFEPIME <=1 SENSITIVE Sensitive     CEFTAZIDIME <=1 SENSITIVE Sensitive     CEFTRIAXONE <=1 SENSITIVE Sensitive     CIPROFLOXACIN <=0.25 SENSITIVE Sensitive     GENTAMICIN >=16 RESISTANT Resistant     IMIPENEM <=0.25 SENSITIVE Sensitive     TRIMETH/SULFA >=320 RESISTANT Resistant     AMPICILLIN/SULBACTAM >=32 RESISTANT Resistant     PIP/TAZO <=4 SENSITIVE Sensitive     Extended ESBL NEGATIVE Sensitive     * FEW ESCHERICHIA COLI  MRSA PCR Screening     Status: None   Collection Time: 06/15/18 10:22 PM  Result Value Ref Range Status   MRSA by PCR NEGATIVE NEGATIVE Final    Comment:        The GeneXpert MRSA Assay (FDA approved for NASAL specimens only), is one component of a comprehensive MRSA  colonization surveillance program. It is not intended to diagnose MRSA infection nor to guide or monitor treatment for MRSA infections. Performed at [facility], [place] [address], [place], [place] [address]       Studies: No results found.  Scheduled Meds: . albuterol  2.5 mg Nebulization TID  . lip balm  1 application Topical BID  . methylPREDNISolone (SOLU-MEDROL) injection  20 mg Intravenous Daily  . metoprolol tartrate  5 mg Intravenous Q8H  . mometasone-formoterol  2 puff Inhalation BID  . pantoprazole (PROTONIX) IV  40 mg Intravenous QHS    Continuous Infusions: . sodium chloride 50 mL/hr at 06/18/18 0934  . acetaminophen 1,000 mg (06/18/18 1609)  . heparin 1,200 Units/hr (06/18/18 0436)  . methocarbamol (ROBAXIN) IV 1,000 mg (06/18/18 1724)  . ondansetron (ZOFRAN) IV    . piperacillin-tazobactam (ZOSYN)  IV 3.375 g (06/18/18 1355)     LOS: 3 days     Kayleen Memos, MD [facility] Pager [phone]  If 7PM-7AM, please contact night-coverage [email or web address] Password [facility] 06/18/2018, 5:26 PM

## 2018-06-18 NOTE — Progress Notes (Signed)
Pt up to the bedside, ambulated pt 50 feet tolerated well wife at bedside with patient. ICe given as ordered. Tolerated well SRP, RN

## 2018-06-18 NOTE — Care Management Important Message (Signed)
Important Message  Patient Details  Name: CYLAN BORUM MRN: [id number] Date of Birth: 1945/11/10   Medicare Important Message Given:  Yes    Kerin Salen 06/18/2018, 11:04 AMImportant Message  Patient Details  Name: KAYTON DUNAJ MRN: [id number] Date of Birth: 1945-08-02   Medicare Important Message Given:  Yes    Kerin Salen 06/18/2018, 11:04 AM

## 2018-06-18 NOTE — Progress Notes (Signed)
ANTICOAGULATION CONSULT NOTE - Initial Consult  Pharmacy Consult for Heparin Indication: atrial fibrillation (PTA Eliquis on hold)  Allergies  Allergen Reactions  . Lidocaine Hcl Anaphylaxis and Other (See Comments)    Xylocaine  . Xylocaine [Lidocaine] Anaphylaxis  . Codeine Nausea And Vomiting  . Crestor [Rosuvastatin Calcium] Other (See Comments)    All-over body aches Myalgias   . Statins Other (See Comments)    Muscle soreness and an aching feeling all over Myalgias  . Percocet [Oxycodone-Acetaminophen] Itching    Patient Measurements: Height: 5\' 9"  (175.3 cm) Weight: 182 lb 15.7 oz (83 kg) IBW/kg (Calculated) : 70.7 Heparin Dosing Weight: TBW  Vital Signs: Temp: 98 F (36.7 C) (02/05 0427) Temp Source: Oral (02/05 0427) BP: 160/97 (02/05 0427) Pulse Rate: 91 (02/05 0427)  Labs: Recent Labs    06/16/18 0336 06/17/18 0242 06/17/18 1141 06/17/18 1142 06/17/18 2138 06/18/18 0426  HGB 13.5 12.3*  --   --   --  11.7*  HCT 40.3 37.3*  --   --   --  36.1*  PLT 195 196  --   --   --  219  APTT  --   --   --  28 65*  --   HEPARINUNFRC  --   --  0.13*  --  0.52 0.40  CREATININE 0.75 0.68  --   --   --  0.65    Estimated Creatinine Clearance: 85.9 mL/min (by C-G formula based on SCr of 0.65 mg/dL).   Medical History: Past Medical History:  Diagnosis Date  . Arthritis   . Chest pain    a. 03/2017: echo showing EF of 60-65%, no regional WMA or significant valve abnormalities. b. 03/2016: NST with no evidence of ischemia.   . Digestive problems    on Prednisone prn for this issue- diarrhea  . Dyspnea    with activity  . Dysrhythmia    irregular due to Myocarditis- takes Atenolol, Norvasc  . GERD (gastroesophageal reflux disease)   . H/O viral myocarditis    25 years ago  . Head injury, closed, with concussion    Breif LOC  . Hyperlipidemia   . Mild mitral valve prolapse    per Dr Evette Georges notes  . PAF (paroxysmal atrial fibrillation) ([place])    a.  initially occuring in 02/2016. b. recurrent in 07/2016. Placed on Eliquis  . Polymyalgia rheumatica ([place])   . Pre-diabetes   . Sleep apnea    mild sleep apnea   no CPAP    Medications:  Scheduled:  . albuterol  2.5 mg Nebulization TID  . lip balm  1 application Topical BID  . methylPREDNISolone (SOLU-MEDROL) injection  20 mg Intravenous Daily  . metoprolol tartrate  5 mg Intravenous Q8H  . mometasone-formoterol  2 puff Inhalation BID  . pantoprazole (PROTONIX) IV  40 mg Intravenous QHS   Infusions:  . sodium chloride 50 mL/hr at 06/17/18 1345  . acetaminophen 1,000 mg (06/18/18 0118)  . heparin 1,200 Units/hr (06/18/18 0436)  . methocarbamol (ROBAXIN) IV 1,000 mg (06/18/18 0433)  . ondansetron (ZOFRAN) IV    . piperacillin-tazobactam (ZOSYN)  IV 3.375 g (06/18/18 0437)    Assessment: 89 yoM admitted on 2/2 for sigmoid colon perforation s/p Hartman colectomy/colostomy 06/15/2018 with ileus.  PMH significant for Afib on Eliquis prior to admission;  Eliquis was held and [facility] given on 2/2 prior to emergent surgery.  Pharmacy is now consulted to dose Heparin IV while NPO and unable to take Eliquis.  CCS ok with starting Heparin today as well.  PTA Eliquis 5mg  BID. Last dose on 2/2 at 0800.  Today, 06/18/2018:  Heparin level therapeutic (0.4) on 1200 units/hr  CBC: Hgb 11.7, Plt wnl  No bleeding or complications reported.  SCr 0.65, stable  Goal of Therapy:  Heparin level 0.3-0.7 units/ml Monitor platelets by anticoagulation protocol: Yes   Plan:   Continue heparin infusion at current rate of 1200 units/hr  Daily CBC and heparin level  F/u ability to resume Eliquis  Peggyann Juba, PharmD, BCPS Pager: [phone]  06/18/2018 7:06 AM

## 2018-06-18 NOTE — Telephone Encounter (Signed)
Pt's wife is calling back [phone]

## 2018-06-18 NOTE — Telephone Encounter (Signed)
Left message for patient's wife to call back.  

## 2018-06-18 NOTE — Progress Notes (Signed)
Physical Therapy Treatment Patient Details Name: [name] MRN: [id number] DOB: 07-Nov-1945 Today's Date: 06/18/2018    History of Present Illness 73 year old male with a history of interstitial lung disease, A. 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 Dx of bowel perforation, s/p colectomy/colostomy on 06/15/18.    PT Comments    Progressing with mobility.    Follow Up Recommendations  No PT follow up     Equipment Recommendations  Rolling walker with 5" wheels    Recommendations for Other Services       Precautions / Restrictions Precautions Precaution Comments: abdominal surgery, wound vac; NG tube Restrictions Weight Bearing Restrictions: No    Mobility  Bed Mobility Overal bed mobility: Needs Assistance Bed Mobility: Sit to Supine       Sit to supine: Mod assist   General bed mobility comments: Assist for bil LEs onto bed.   Transfers Overall transfer level: Needs assistance Equipment used: Rolling walker (2 wheeled) Transfers: Sit to/from Stand Sit to Stand: Min assist         General transfer comment: VCs safety, technique, hand placement. Increased time.   Ambulation/Gait Ambulation/Gait assistance: Min guard Gait Distance (Feet): 125 Feet Assistive device: Rolling walker (2 wheeled) Gait Pattern/deviations: Step-through pattern;Decreased stride length     General Gait Details: close guard for safety. slow gait speed. pt fatigues fairly quickly   Marine scientist Rankin (Stroke Patients Only)       Balance Overall balance assessment: Mild deficits observed, not formally tested                                          Cognition Arousal/Alertness: Awake/alert Behavior During Therapy: WFL for tasks assessed/performed Overall Cognitive Status: Within Functional Limits for tasks assessed                                         Exercises      General Comments        Pertinent Vitals/Pain Pain Assessment: 0-10 Pain Score: 5  Pain Location: abd pain Pain Descriptors / Indicators: Sore Pain Intervention(s): Monitored during session;Repositioned    Home Living                      Prior Function            PT Goals (current goals can now be found in the care plan section) Progress towards PT goals: Progressing toward goals    Frequency    Min 3X/week      PT Plan Current plan remains appropriate    Co-evaluation              AM-PAC PT "6 Clicks" Mobility   Outcome Measure  Help needed turning from your back to your side while in a flat bed without using bedrails?: A Little Help needed moving from lying on your back to sitting on the side of a flat bed without using bedrails?: A Little Help needed moving to and from a bed to a chair (including a wheelchair)?: A Little Help needed standing up from a chair using your arms (  e.g., wheelchair or bedside chair)?: A Little Help needed to walk in hospital room?: A Little Help needed climbing 3-5 steps with a railing? : A Little 6 Click Score: 18    End of Session   Activity Tolerance: Patient limited by fatigue Patient left: in bed;with call bell/phone within reach;with family/visitor present   PT Visit Diagnosis: Other abnormalities of gait and mobility (R26.89);Muscle weakness (generalized) (M62.81);Difficulty in walking, not elsewhere classified (R26.2)     Time: 4709-6283 PT Time Calculation (min) (ACUTE ONLY): 13 min  Charges:  $Gait Training: 8-22 mins                        Weston Anna, PT Acute Rehabilitation Services Pager: [phone] Office: [phone]

## 2018-06-18 NOTE — Progress Notes (Signed)
[facility] Progress Note  3 Days Post-Op  Subjective: CC-  Wife at bedside. Patient feeling a little better this morning. States that he was bloated and nauseated until his nurse got NG tube working again this morning. Started passing flatus, no stool from ostomy yet. Pain well controlled on currently regimen.   Objective: Vital signs in last 24 hours: Temp:  [97.7 F (36.5 C)-98.6 F (37 C)] 98.6 F (37 C) (02/05 0903) Pulse Rate:  [91-105] 97 (02/05 0903) Resp:  [16-20] 16 (02/05 0427) BP: (140-160)/(86-98) 152/87 (02/05 0903) SpO2:  [92 %-96 %] 96 % (02/05 0903)    Intake/Output from previous day: 02/04 0701 - 02/05 0700 In: 1684 [P.O.:205; I.V.:744; IV Piggyback:735] Out: 700 [Urine:550; Emesis/NG output:150] Intake/Output this shift: Total I/O In: 44 [P.O.:50] Out: 375 [Urine:375]  PE: Gen: Alert, NAD, pleasant HEENT: EOM's intact, pupils equal and round Pulm: effort normal Psych: A&Ox3  Skin: warm and dry Abd: Soft,distended, mild lower abdominal TTP, vac to midline wound, +BS, ostomy pink with air in pouch/no stool  Lab Results:  Recent Labs    06/17/18 0242 06/18/18 0426  WBC 14.8* 12.9*  HGB 12.3* 11.7*  HCT 37.3* 36.1*  PLT 196 219   BMET Recent Labs    06/17/18 0242 06/18/18 0426  NA 137 139  K 3.9 4.5  CL 106 108  CO2 22 23  GLUCOSE 204* 106*  BUN 12 12  CREATININE 0.68 0.65  CALCIUM 7.2* 7.7*   PT/INR No results for input(s): LABPROT, INR in the last 72 hours. CMP     Component Value Date/Time   NA 139 06/18/2018 0426   K 4.5 06/18/2018 0426   CL 108 06/18/2018 0426   CO2 23 06/18/2018 0426   GLUCOSE 106 (H) 06/18/2018 0426   BUN 12 06/18/2018 0426   CREATININE 0.65 06/18/2018 0426   CREATININE 0.95 07/16/2016 0902   CALCIUM 7.7 (L) 06/18/2018 0426   PROT 5.1 (L) 06/18/2018 0426   ALBUMIN 2.0 (L) 06/18/2018 0426   AST 40 06/18/2018 0426   ALT 59 (H) 06/18/2018 0426   ALKPHOS 97 06/18/2018 0426   BILITOT  0.7 06/18/2018 0426   GFRNONAA >60 06/18/2018 0426   GFRAA >60 06/18/2018 0426   Lipase     Component Value Date/Time   LIPASE 29 06/15/2018 1355       Studies/Results: No results found.  Anti-infectives: Anti-infectives (From admission, onward)   Start     Dose/Rate Route Frequency Ordered Stop   06/15/18 2359  metroNIDAZOLE (FLAGYL) IVPB 500 mg  Status:  Discontinued     500 mg 100 mL/hr over 60 Minutes Intravenous Every 8 hours 06/15/18 1655 06/16/18 0934   06/15/18 2300  piperacillin-tazobactam (ZOSYN) IVPB 3.375 g  Status:  Discontinued     3.375 g 12.5 mL/hr over 240 Minutes Intravenous Every 8 hours 06/15/18 2256 06/15/18 2310   06/15/18 2200  piperacillin-tazobactam (ZOSYN) IVPB 3.375 g     3.375 g 12.5 mL/hr over 240 Minutes Intravenous Every 8 hours 06/15/18 1743     06/15/18 1445  ceFEPIme (MAXIPIME) 2 g in sodium chloride 0.9 % 100 mL IVPB     2 g 200 mL/hr over 30 Minutes Intravenous  Once 06/15/18 1431 06/15/18 1606   06/15/18 1445  metroNIDAZOLE (FLAGYL) IVPB 500 mg     500 mg 100 mL/hr over 60 Minutes Intravenous  Once 06/15/18 1431 06/15/18 1711       Assessment/Plan Afib RVR - on cardizem and metoprolol, hold  eliquis Interstitial lung disease - on chronic prednisone HTN Transaminitis/hyperbilirubinemia - hepatitis panel negative, LFTs trending down  Perforated sigmoid colon S/pEXPLORATORY LAPAROTOMYWITHSIGMOID COLON RESECTIONAND END COLOSTOMY(HARTMAN)2/2 Dr. Marlou Starks - POD 3 - surgical pathology: PERFORATED DIVERTICULITIS WITH ABSCESS AND SEROSITIS. NO DYSPLASIA OR MALIGNANCY. - intraop culture report pending - vac changes MWF  ID -zosyn 2/2>>day#4 (needs 5 days minimum IV abx), flagyl 2/2>>2/3 FEN -IVF, NPO/NGT VTE -SCDs, IV heparin Foley -d/c on 2/4 Follow up -Dr. Marlou Starks  Plan- Starting to pass some flatus but still distended and not passing any stool yet. Continue NPO/NG tube to LIWS. Continue mobilization, therapies.   LOS: 3  days    Wellington Hampshire , [facility] Surgery 06/18/2018, 10:14 AM Pager: [phone] Mon-Thurs 7:00 am-4:30 pm Fri 7:00 am -11:30 AM Sat-Sun 7:00 am-11:30 am

## 2018-06-18 NOTE — Progress Notes (Signed)
Pharmacy Antibiotic Note  [name] is a 73 y.o. male presented to the ED on 06/15/2018 with c/o abd pain. To start zosyn for intra-abdominal infection. Abd CT on 2/2 showed findings consistent with perforated sigmoid diverticulitis. Plan is to reverse Eliquis with Andexxa prior to taking patient to OR for exploratory surgery. Now on zosyn for intra-abdominal infection.  Today, 06/18/2018: - Day #4 Zosyn - scr 0.65, stable - WBC 12.9 (steroids), improved - afebrile - plan at least 5 days per [facility]  Plan: - Continue zosyn 3.375 gm IV q8h (infuse over 4 hrs) - No adjustments needed, Pharmacy to sign off  Height: 5\' 9"  (175.3 cm) Weight: 182 lb 15.7 oz (83 kg) IBW/kg (Calculated) : 70.7  Temp (24hrs), Avg:97.9 F (36.6 C), Min:97.7 F (36.5 C), Max:98 F (36.7 C)  Recent Labs  Lab 06/15/18 1355 06/16/18 0336 06/17/18 0242 06/18/18 0426  WBC 9.6 12.5* 14.8* 12.9*  CREATININE 1.01 0.75 0.68 0.65    Estimated Creatinine Clearance: 85.9 mL/min (by C-G formula based on SCr of 0.65 mg/dL).    Allergies  Allergen Reactions  . Lidocaine Hcl Anaphylaxis and Other (See Comments)    Xylocaine  . Xylocaine [Lidocaine] Anaphylaxis  . Codeine Nausea And Vomiting  . Crestor [Rosuvastatin Calcium] Other (See Comments)    All-over body aches Myalgias   . Statins Other (See Comments)    Muscle soreness and an aching feeling all over Myalgias  . Percocet [Oxycodone-Acetaminophen] Itching   Antimicrobials this admission:  2/2 Cefepime / Metronidazole x1 dose 2/2 Zosyn>>  Dose adjustments this admission:  None  Microbiology results:  2/2 MRSA PCR: neg 2/2 Abscess:  gram stain few GPC, few GPR, Cxt pending.  Thank you for allowing pharmacy to be a part of this patient's care.  Peggyann Juba, PharmD, BCPS Pager: [phone] 06/18/2018 7:46 AM

## 2018-06-19 LAB — TYPE AND SCREEN
ABO/RH(D): A POS
Antibody Screen: NEGATIVE
Unit division: 0
Unit division: 0

## 2018-06-19 LAB — BASIC METABOLIC PANEL
Anion gap: 9 (ref 5–15)
BUN: 11 mg/dL (ref 8–23)
CO2: 21 mmol/L — ABNORMAL LOW (ref 22–32)
Calcium: 7.9 mg/dL — ABNORMAL LOW (ref 8.9–10.3)
Chloride: 108 mmol/L (ref 98–111)
Creatinine, Ser: 0.6 mg/dL — ABNORMAL LOW (ref 0.61–1.24)
GFR calc Af Amer: >60 mL/min (ref >60)
GFR calc non Af Amer: >60 mL/min (ref >60)
Glucose, Bld: 102 mg/dL — ABNORMAL HIGH (ref 70–99)
Potassium: 3.9 mmol/L (ref 3.5–5.1)
Sodium: 138 mmol/L (ref 135–145)

## 2018-06-19 LAB — CBC
HCT: 37.5 % — ABNORMAL LOW (ref 39.0–52.0)
Hemoglobin: 12.4 g/dL — ABNORMAL LOW (ref 13.0–17.0)
MCH: 35.5 pg — ABNORMAL HIGH (ref 26.0–34.0)
MCHC: 33.1 g/dL (ref 30.0–36.0)
MCV: 107.4 fL — ABNORMAL HIGH (ref 80.0–100.0)
Platelets: 228 10*3/uL (ref 150–400)
RBC: 3.49 MIL/uL — ABNORMAL LOW (ref 4.22–5.81)
RDW: 13 % (ref 11.5–15.5)
WBC: 9.1 10*3/uL (ref 4.0–10.5)
nRBC: 0 % (ref 0.0–0.2)

## 2018-06-19 LAB — HEPARIN LEVEL (UNFRACTIONATED): Heparin Unfractionated: 0.29 IU/mL — ABNORMAL LOW (ref 0.30–0.70)

## 2018-06-19 LAB — BPAM RBC
Blood Product Expiration Date: 202002262359
Blood Product Expiration Date: 202002262359
Unit Type and Rh: 6200
Unit Type and Rh: 6200

## 2018-06-19 MED ORDER — HEPARIN (PORCINE) 25000 UT/250ML-% IV SOLN
1250.0000 [IU]/h | INTRAVENOUS | Status: DC
  Administered 2018-06-19: 1250 [IU]/h via INTRAVENOUS
  Filled 2018-06-19: qty 250

## 2018-06-19 MED ORDER — METHOCARBAMOL 1000 MG/10ML IJ SOLN
1000.0000 mg | Freq: Four times a day (QID) | INTRAVENOUS | Status: DC | PRN
  Administered 2018-06-19 (×2): 1000 mg via INTRAVENOUS
  Filled 2018-06-19 (×2): qty 10

## 2018-06-19 MED ORDER — METOPROLOL TARTRATE 5 MG/5ML IV SOLN
5.0000 mg | Freq: Four times a day (QID) | INTRAVENOUS | Status: DC
  Administered 2018-06-19: 5 mg via INTRAVENOUS
  Filled 2018-06-19: qty 5

## 2018-06-19 MED ORDER — METOPROLOL TARTRATE 25 MG PO TABS
25.0000 mg | ORAL_TABLET | Freq: Two times a day (BID) | ORAL | Status: DC
  Administered 2018-06-19: 25 mg via ORAL
  Filled 2018-06-19: qty 1

## 2018-06-19 NOTE — Progress Notes (Signed)
OT Cancellation Note  Patient Details Name: [name] MRN: [id number] DOB: [birth year]   Cancelled Treatment:    Reason Eval/Treat Not Completed: Pain limiting ability to participate. Pt just got pain medication. Will check later or tomorrow as schedule permits.  Flor Houdeshell 06/19/2018, 2:17 PM  Lesle Chris, OTR/L Acute Rehabilitation Services [phone] [facility] pager [phone] office 06/19/2018

## 2018-06-19 NOTE — Progress Notes (Signed)
ANTICOAGULATION CONSULT NOTE - Initial Consult  Pharmacy Consult for Heparin Indication: atrial fibrillation (PTA Eliquis on hold)  Allergies  Allergen Reactions  . Lidocaine Hcl Anaphylaxis and Other (See Comments)    Xylocaine  . Xylocaine [Lidocaine] Anaphylaxis  . Codeine Nausea And Vomiting  . Crestor [Rosuvastatin Calcium] Other (See Comments)    All-over body aches Myalgias   . Statins Other (See Comments)    Muscle soreness and an aching feeling all over Myalgias  . Percocet [Oxycodone-Acetaminophen] Itching    Patient Measurements: Height: 5\' 9"  (175.3 cm) Weight: 182 lb 15.7 oz (83 kg) IBW/kg (Calculated) : 70.7 Heparin Dosing Weight: TBW  Vital Signs: Temp: 97.5 F (36.4 C) (02/06 0622) Temp Source: Oral (02/06 0622) BP: 159/94 (02/06 0622) Pulse Rate: 83 (02/06 0622)  Labs: Recent Labs    06/17/18 0242  06/17/18 1142 06/17/18 2138 06/18/18 0426 06/19/18 0404 06/19/18 0632  HGB 12.3*  --   --   --  11.7* 12.4*  --   HCT 37.3*  --   --   --  36.1* 37.5*  --   PLT 196  --   --   --  219 228  --   APTT  --   --  28 65*  --   --   --   HEPARINUNFRC  --    < >  --  0.52 0.40  --  0.29*  CREATININE 0.68  --   --   --  0.65 0.60*  --    < > = values in this interval not displayed.    Estimated Creatinine Clearance: 85.9 mL/min (A) (by C-G formula based on SCr of 0.6 mg/dL (L)).  Assessment: 8 yoM admitted on 2/2 for sigmoid colon perforation s/p Hartman colectomy/colostomy 06/15/2018 with ileus.  PMH significant for Afib on Eliquis prior to admission;  Eliquis was held and [facility] given on 2/2 prior to emergent surgery.  Pharmacy is now consulted to dose Heparin IV while NPO and unable to take Eliquis.  CCS ok with starting Heparin today as well.  PTA Eliquis 5mg  BID. Last dose on 2/2 at 0800.  Today, 06/19/2018:  Heparin level slightly low  (0.29) on 1200 units/hr  CBC: Hgb 12.4, Plt wnl  No bleeding or complications reported.  SCr 0.6,  stable  Goal of Therapy:  Heparin level 0.3-0.7 units/ml Monitor platelets by anticoagulation protocol: Yes   Plan:   Increase heparin infusion to 1250 units/hr  Daily CBC and heparin level  F/u ability to resume Eliquis  Eudelia Bunch, Pharm.D [phone] 06/19/2018 7:22 AM

## 2018-06-19 NOTE — Plan of Care (Signed)
Patient tolerating clear liquids, no nausea, no residuals from NG tube.  Ambulated entire length of unit 4 times on 7 a to 7 p shift, sat up in the chair as well.  Medicated for pain x 2 this shift with improvement.  Wife and multiple friends at bedside.

## 2018-06-19 NOTE — Progress Notes (Signed)
PROGRESS NOTE  [name] JYN:[id number] DOB: [birth year] DOA: 06/15/2018 PCP: [name], MD  HPI/Recap of past 24 hours: Patient is 73 year old male with a history of interstitial lung disease on prednisone, CellCept, and nintedanib,A. 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  No nausea vomiting fevers.  He does have chronic shortness of breath due to ILD. CT abdomen pelvis showed proximal sigmoid diverticulitis with large amount of free air in the adjacent peritoneal fat and extending in the epigastric region consistent with perforation.  5.2x 4.3 cm fluid collection with air-fluid levels in the area concerning for contained perforation or developing abscess.  Mild small bowel dilatation may represent ileus or possibly obstruction.  06/18/2018: Patient seen and examined with his wife at bedside.  No abdominal pain or nausea at the time of this visit.  Denies chest pain, palpitation or dyspnea at rest.  06/19/2018: Patient seen and examined at his bedside.  No acute events overnight.  Pain is well controlled with current pain management.  NG tube is clamped this morning and started on clear liquid per General surgery.  Stool output noted in colostomy bag.   Assessment/Plan: Principal Problem:   Sigmoid colon perforation s/p Hartmann colectomy/colostomy 06/15/2018 Active Problems:   Abnormal LFTs   Essential hypertension   ILD (interstitial lung disease) (HCC)   PAF (paroxysmal atrial fibrillation) (HCC)   OSA (obstructive sleep apnea)  Acute sigmoid diverticulitis with perforation s/p Hartmann colectomy/colostomy 06/15/2018 with ileus -Underwent exploratory laparotomy with sigmoid colon resection and end colostomy -Ileus appears to be resolving, tolerating clear liquid diet, stool output in colostomy bag -Intra-Op culture report E. coli which is  sensitive to Zosyn -Continue Zosyn for a minimum of 5 days as recommended by general surgery -Postop day #4, management per general surgery -Mobilize -Afebrile, leukocytosis is resolving -Hemoglobin is stable -VAC changes Monday Wednesday Friday as recommended by general surgery -Continue clear liquid diet, if tolerates can remove NG tube tomorrow and advance to full liquid as recommended by surgery  Active Problems:   Abnormal LFTs -In the setting of acute diverticulitis, perforation -Acute hepatitis panel negative, follow LFTs  Uncontrolled hypertension Improved after increasing IV metoprolol to 5 mg every 6 Continue to closely monitor vital signs on telemetry Resume p.o. beta-blocker and continue IV metoprolol as PRN    ILD (interstitial lung disease) ([place]) -Continue to hold CellCept, nintedanib, has been on prednisone for many years -Continue low-dose Solu-Medrol to avoid adrenal insufficiency  -Continue Symbicort, nebulizer treatments    PAF (paroxysmal atrial fibrillation) ([place]) -Continue IV Lopressor, continue to hold Eliquis - Continue heparin drip - Start p.o. beta-blocker and continue IV Lopressor as needed  Code Status: Full CODE STATUS DVT Prophylaxis: Heparin drip Family Communication:  Wife at bedside.  All questions answered to her satisfaction. Disposition Plan: Home versus SNF in 1 to 2 days when hemodynamically stable.    Procedures:  Expiratory laparotomy with sigmoid colon resection and colostomy  Consultants:   General surgery    Objective: Vitals:   06/19/18 0816 06/19/18 0900 06/19/18 1400 06/19/18 1441  BP:  (!) 155/77 (!) 158/76   Pulse:  92 73   Resp:  18 15   Temp:  98.2 F (36.8 C) 98.3 F (36.8 C)   TempSrc:  Oral Oral   SpO2: 99% 96% 96% 96%  Weight:      Height:  Intake/Output Summary (Last 24 hours) at 06/19/2018 1559 Last data filed at 06/19/2018 1400 Gross per 24 hour  Intake 2436 ml  Output 2201 ml  Net 235  ml   Filed Weights   06/15/18 1758  Weight: 83 kg    Exam:  . General: 73 y.o. year-old male well developed well nourished in no acute distress.  Alert and oriented times 3. . Cardiovascular: Regular rate and rhythm with no rubs.   Marland Kitchen Respiratory: Clear to auscultation with no wheezes or rales.  Good inspiratory effort. . Abdomen: Soft nontender.  Ostomy pink.  Dressing appears intact.  Positive bowel sounds.  Wound VAC in place.  Colostomy bag in place with stool output. . Musculoskeletal: No cyanosis or clubbing.  No pedal edema bilaterally. Marland Kitchen Psychiatry: Mood is appropriate for condition and setting   Data Reviewed: CBC: Recent Labs  Lab 06/15/18 1355 06/15/18 2006 06/16/18 0336 06/17/18 0242 06/18/18 0426 06/19/18 0404  WBC 9.6  --  12.5* 14.8* 12.9* 9.1  HGB 16.0 11.2* 13.5 12.3* 11.7* 12.4*  HCT 46.4 33.0* 40.3 37.3* 36.1* 37.5*  MCV 105.9*  --  108.0* 109.7* 109.7* 107.4*  PLT 186  --  195 196 219 782   Basic Metabolic Panel: Recent Labs  Lab 06/15/18 1355 06/15/18 2006 06/16/18 0336 06/17/18 0242 06/18/18 0426 06/19/18 0404  NA 136 135 136 137 139 138  K 3.9 4.5 4.6 3.9 4.5 3.9  CL 102  --  107 106 108 108  CO2 24  --  22 22 23  21*  GLUCOSE 138* 170* 207* 204* 106* 102*  BUN 13  --  8 12 12 11   CREATININE 1.01  --  0.75 0.68 0.65 0.60*  CALCIUM 8.4*  --  6.9* 7.2* 7.7* 7.9*   GFR: Estimated Creatinine Clearance: 85.9 mL/min (A) (by C-G formula based on SCr of 0.6 mg/dL (L)). Liver Function Tests: Recent Labs  Lab 06/15/18 1355 06/16/18 0336 06/18/18 0426  AST 48* 70* 40  ALT 108* 106* 59*  ALKPHOS 124 90 97  BILITOT 1.6* 1.3* 0.7  PROT 6.5 5.3* 5.1*  ALBUMIN 3.0* 2.1* 2.0*   Recent Labs  Lab 06/15/18 1355  LIPASE 29   No results for input(s): AMMONIA in the last 168 hours. Coagulation Profile: No results for input(s): INR, PROTIME in the last 168 hours. Cardiac Enzymes: No results for input(s): CKTOTAL, CKMB, CKMBINDEX, TROPONINI in  the last 168 hours. BNP (last 3 results) No results for input(s): PROBNP in the last 8760 hours. HbA1C: No results for input(s): HGBA1C in the last 72 hours. CBG: No results for input(s): GLUCAP in the last 168 hours. Lipid Profile: No results for input(s): CHOL, HDL, LDLCALC, TRIG, CHOLHDL, LDLDIRECT in the last 72 hours. Thyroid Function Tests: No results for input(s): TSH, T4TOTAL, FREET4, T3FREE, THYROIDAB in the last 72 hours. Anemia Panel: No results for input(s): VITAMINB12, FOLATE, FERRITIN, TIBC, IRON, RETICCTPCT in the last 72 hours. Urine analysis:    Component Value Date/Time   COLORURINE YELLOW 02/17/2018 1404   APPEARANCEUR CLEAR 02/17/2018 1404   LABSPEC 1.020 02/17/2018 1404   PHURINE 5.0 02/17/2018 1404   GLUCOSEU NEGATIVE 02/17/2018 1404   HGBUR NEGATIVE 02/17/2018 1404   BILIRUBINUR NEGATIVE 02/17/2018 1404   KETONESUR NEGATIVE 02/17/2018 1404   PROTEINUR NEGATIVE 02/17/2018 1404   UROBILINOGEN 0.2 01/22/2007 0720   NITRITE NEGATIVE 02/17/2018 1404   LEUKOCYTESUR NEGATIVE 02/17/2018 1404   Sepsis Labs: @LABRCNTIP (procalcitonin:4,lacticidven:4)  ) Recent Results (from the past 240 hour(s))  Aerobic/Anaerobic  Culture (surgical/deep wound)     Status: None   Collection Time: 06/15/18  8:17 PM  Result Value Ref Range Status   Specimen Description   Final    ABSCESS Performed at [facility] [address], [place], [place] [address]    Special Requests   Final    NONE Performed at [facility], [place] [address] [place], [place] [address]    Gram Stain   Final    MODERATE WBC PRESENT, PREDOMINANTLY PMN FEW GRAM POSITIVE COCCI FEW GRAM POSITIVE RODS Performed at [facility], [place] [address] [place], [place] [address]    Culture   Final    FEW ESCHERICHIA COLI MIXED ANAEROBIC FLORA PRESENT.  CALL LAB IF FURTHER IID REQUIRED.    Report Status 06/18/2018 FINAL  Final   Organism ID, Bacteria ESCHERICHIA COLI   Final      Susceptibility   Escherichia coli - MIC*    AMPICILLIN >=32 RESISTANT Resistant     CEFAZOLIN <=4 SENSITIVE Sensitive     CEFEPIME <=1 SENSITIVE Sensitive     CEFTAZIDIME <=1 SENSITIVE Sensitive     CEFTRIAXONE <=1 SENSITIVE Sensitive     CIPROFLOXACIN <=0.25 SENSITIVE Sensitive     GENTAMICIN >=16 RESISTANT Resistant     IMIPENEM <=0.25 SENSITIVE Sensitive     TRIMETH/SULFA >=320 RESISTANT Resistant     AMPICILLIN/SULBACTAM >=32 RESISTANT Resistant     PIP/TAZO <=4 SENSITIVE Sensitive     Extended ESBL NEGATIVE Sensitive     * FEW ESCHERICHIA COLI  MRSA PCR Screening     Status: None   Collection Time: 06/15/18 10:22 PM  Result Value Ref Range Status   MRSA by PCR NEGATIVE NEGATIVE Final    Comment:        The GeneXpert MRSA Assay (FDA approved for NASAL specimens only), is one component of a comprehensive MRSA colonization surveillance program. It is not intended to diagnose MRSA infection nor to guide or monitor treatment for MRSA infections. Performed at [facility], [place] [address] [place], [place] [address]       Studies: No results found.  Scheduled Meds: . albuterol  2.5 mg Nebulization TID  . lip balm  1 application Topical BID  . methylPREDNISolone (SOLU-MEDROL) injection  20 mg Intravenous Daily  . metoprolol tartrate  5 mg Intravenous Q6H  . mometasone-formoterol  2 puff Inhalation BID  . pantoprazole (PROTONIX) IV  40 mg Intravenous QHS    Continuous Infusions: . sodium chloride 75 mL/hr at 06/19/18 0650  . heparin 1,250 Units/hr (06/19/18 0725)  . methocarbamol (ROBAXIN) IV 1,000 mg (06/19/18 1127)  . ondansetron (ZOFRAN) IV    . piperacillin-tazobactam (ZOSYN)  IV 3.375 g (06/19/18 1410)     LOS: 4 days     Kayleen Memos, MD [facility] Pager [phone]  If 7PM-7AM, please contact night-coverage [email or web address] Password [email or web address] 06/19/2018, 3:59 PM

## 2018-06-19 NOTE — Progress Notes (Signed)
[name] [id number] 1946-01-30  CARE TEAM:  PCP: [name], MD  Outpatient Care Team: Patient Care Team: [name], MD as PCP - General (Internal Medicine) Troy Sine, MD as PCP - Cardiology (Cardiology)  Inpatient Treatment Team: Treatment Team: Attending Provider: Kayleen Memos, DO; Consulting Physician: Edison Pace, Md, MD; [place] Nurse: Tenna Child, RN; Registered Nurse: Kirby Crigler, RN; Rounding Team: Threasa Beards, MD; Registered Nurse: Zadie Rhine, RN; Occupational Therapist: Lesle Chris, OT; Registered Nurse: Leona Carry, RN; Technician: Velna Hatchet, NT; Registered Nurse: Nilda Calamity, RN   Problem List:   Principal Problem:   Sigmoid colon perforation s/p Hartmann colectomy/colostomy 06/15/2018 Active Problems:   ILD (interstitial lung disease) ([place])   Abnormal LFTs   Essential hypertension   PAF (paroxysmal atrial fibrillation) (HCC)   OSA (obstructive sleep apnea)   4 Days Post-Op  06/15/2018  POST-OPERATIVE DIAGNOSIS:  Perforated sigmoid colon  PROCEDURE:  Procedure(s): EXPLORATORY LAPAROTOMY WITH SIGMOID COLON RESECTION AND END COLOSTOMY(HARTMAN)  SURGEON:  Surgeon(s) and Role:    [name], MD - Primary    * Georganna Skeans, MD - Assisting  PATHOLOGY Diagnosis Colon, segmental resection, sigmoid - PERFORATED DIVERTICULITIS WITH ABSCESS AND SEROSITIS. - NO DYSPLASIA OR MALIGNANCY. Vicente Males MD Pathologist, Electronic Signature (Case signed 06/17/2018) Specimen Lenice Koper and Clinical Information Specimen(s) Obtained: Colon, segmental resection, sigmoid  Assessment  Postoperative ileus status post emergency Hartmann resection for perforated sigmoid diverticulitis  [facility] Stay = 4 days)  Plan:    Perforated sigmoid colon S/pEXPLORATORY LAPAROTOMYWITHSIGMOID COLON RESECTIONAND END COLOSTOMY(HARTMAN)2/2 Dr. Marlou Starks - POD4 - surgical pathology: PERFORATED DIVERTICULITIS WITH ABSCESS AND  SEROSITIS. NO DYSPLASIA OR MALIGNANCY.  ID -zosyn 2/2>>day#4 (needs 5 days minimum IV abx), flagyl 2/2>>2/3  FEN -IVF, NPO/NGT.  Try nasogastric tube clamping trial with liquids.  If tolerates, remove nasogastric tube tomorrow and advance to full liquids.  If cannot tolerate, resume suction.  If ileus persists beyond postoperative day #7, start parenteral nutrition.  VTE -SCDs, IV heparin Cardiac: Rate control and anticoagulation per primary service/cardiology Foley -d/c on 2/4 Follow up -Dr. Marlou Starks Try and get him up mobilizing more.  Colostomy care.  Wound VAC for incision.  Change Monday Wednesday Friday  20 minutes spent in review, evaluation, examination, counseling, and coordination of care.  More than 50% of that time was spent in counseling.  06/19/2018    Subjective: (Chief complaint)  No major events.  Less sore.  Wants to get up more.  Denies any nausea.  Objective:  Vital signs:  Vitals:   06/18/18 2107 06/19/18 0213 06/19/18 0449 06/19/18 0622  BP:  (!) 161/95 (!) 161/97 (!) 159/94  Pulse:  85 62 83  Resp:   15 20  Temp:   97.7 F (36.5 C) (!) 97.5 F (36.4 C)  TempSrc:   Oral Oral  SpO2: 96%  97% 91%  Weight:      Height:           Intake/Output   Yesterday:  02/05 0701 - 02/06 0700 In: 2138 [P.O.:250; I.V.:1488; NG/GT:300; IV Piggyback:100] Out: 3026 [Urine:2675; Emesis/NG output:300; Drains:50; Stool:1] This shift:  No intake/output data recorded.  Bowel function:  Flatus: YES  BM:  YES  Drain: Thin bilious effluent   Physical Exam:  General: Pt awake/alert/oriented x4 in no acute distress Eyes: PERRL, normal EOM.  Sclera clear.  No icterus Neuro: CN II-XII intact w/o focal sensory/motor deficits. Lymph: No head/neck/groin lymphadenopathy Psych:  No  delerium/psychosis/paranoia HENT: Normocephalic, Mucus membranes moist.  No thrush Neck: Supple, No tracheal deviation Chest:  No chest wall pain w good excursion CV:  Pulses  intact.  Regular rhythm MS: Normal AROM mjr joints.  No obvious deformity  Abdomen: Somewhat firm.  Moderately distended.  Tenderness at Lower abdomen.  Mild.  Improved.  No guarding or severe peritonitis.  No incarcerated hernias.  Ext:  No deformity.  No mjr edema.  No cyanosis Skin: No petechiae / purpura  Results:   Labs: Results for orders placed or performed during the hospital encounter of 06/15/18 (from the past 48 hour(s))  Heparin level (unfractionated)     Status: Abnormal   Collection Time: 06/17/18 11:41 AM  Result Value Ref Range   Heparin Unfractionated 0.13 (L) 0.30 - 0.70 IU/mL    Comment: (NOTE) If heparin results are below expected values, and patient dosage has  been confirmed, suggest follow up testing of antithrombin III levels. Performed at [facility], [place] [address], [place], [place] [address]   APTT     Status: None   Collection Time: 06/17/18 11:42 AM  Result Value Ref Range   aPTT 28 24 - 36 seconds    Comment: Performed at [facility], [place] [address] [place], [place] [address]  Heparin level (unfractionated)     Status: None   Collection Time: 06/17/18  9:38 PM  Result Value Ref Range   Heparin Unfractionated 0.52 0.30 - 0.70 IU/mL    Comment: (NOTE) If heparin results are below expected values, and patient dosage has  been confirmed, suggest follow up testing of antithrombin III levels. Performed at [facility], [place] [address] [place] [address] [place], [place] [address]   APTT     Status: Abnormal   Collection Time: 06/17/18  9:38 PM  Result Value Ref Range   aPTT 65 (H) 24 - 36 seconds    Comment:        IF BASELINE aPTT IS ELEVATED, SUGGEST PATIENT RISK ASSESSMENT BE USED TO DETERMINE APPROPRIATE ANTICOAGULANT THERAPY. Performed at [facility], [place] [address] [place], [place] [address]   Comprehensive metabolic panel     Status: Abnormal   Collection Time: 06/18/18  4:26  AM  Result Value Ref Range   Sodium 139 135 - 145 mmol/L   Potassium 4.5 3.5 - 5.1 mmol/L   Chloride 108 98 - 111 mmol/L   CO2 23 22 - 32 mmol/L   Glucose, Bld 106 (H) 70 - 99 mg/dL   BUN 12 8 - 23 mg/dL   Creatinine, Ser 0.65 0.61 - 1.24 mg/dL   Calcium 7.7 (L) 8.9 - 10.3 mg/dL   Total Protein 5.1 (L) 6.5 - 8.1 g/dL   Albumin 2.0 (L) 3.5 - 5.0 g/dL   AST 40 15 - 41 U/L   ALT 59 (H) 0 - 44 U/L   Alkaline Phosphatase 97 38 - 126 U/L   Total Bilirubin 0.7 0.3 - 1.2 mg/dL   GFR calc non Af Amer >60 >60 mL/min   GFR calc Af Amer >60 >60 mL/min   Anion gap 8 5 - 15    Comment: Performed at [facility], [place] [address] [place],  [address]  CBC     Status: Abnormal   Collection Time: 06/18/18  4:26 AM  Result Value Ref Range   WBC 12.9 (H) 4.0 - 10.5 K/uL   RBC 3.29 (L) 4.22 - 5.81 MIL/uL   Hemoglobin 11.7 (  L) 13.0 - 17.0 g/dL   HCT 36.1 (L) 39.0 - 52.0 %   MCV 109.7 (H) 80.0 - 100.0 fL   MCH 35.6 (H) 26.0 - 34.0 pg   MCHC 32.4 30.0 - 36.0 g/dL   RDW 13.2 11.5 - 15.5 %   Platelets 219 150 - 400 K/uL   nRBC 0.0 0.0 - 0.2 %    Comment: Performed at [facility], [place] [address] [place], [place] [address]  Heparin level (unfractionated)     Status: None   Collection Time: 06/18/18  4:26 AM  Result Value Ref Range   Heparin Unfractionated 0.40 0.30 - 0.70 IU/mL    Comment: (NOTE) If heparin results are below expected values, and patient dosage has  been confirmed, suggest follow up testing of antithrombin III levels. Performed at [facility], [place] [address], [place], [place] [address]   CBC     Status: Abnormal   Collection Time: 06/19/18  4:04 AM  Result Value Ref Range   WBC 9.1 4.0 - 10.5 K/uL   RBC 3.49 (L) 4.22 - 5.81 MIL/uL   Hemoglobin 12.4 (L) 13.0 - 17.0 g/dL   HCT 37.5 (L) 39.0 - 52.0 %   MCV 107.4 (H) 80.0 - 100.0 fL   MCH 35.5 (H) 26.0 - 34.0 pg   MCHC 33.1 30.0 - 36.0 g/dL   RDW 13.0 11.5 - 15.5  %   Platelets 228 150 - 400 K/uL   nRBC 0.0 0.0 - 0.2 %    Comment: Performed at [facility], [place] [address] [place], [place] [address]  Basic metabolic panel     Status: Abnormal   Collection Time: 06/19/18  4:04 AM  Result Value Ref Range   Sodium 138 135 - 145 mmol/L   Potassium 3.9 3.5 - 5.1 mmol/L   Chloride 108 98 - 111 mmol/L   CO2 21 (L) 22 - 32 mmol/L   Glucose, Bld 102 (H) 70 - 99 mg/dL   BUN 11 8 - 23 mg/dL   Creatinine, Ser 0.60 (L) 0.61 - 1.24 mg/dL   Calcium 7.9 (L) 8.9 - 10.3 mg/dL   GFR calc non Af Amer >60 >60 mL/min   GFR calc Af Amer >60 >60 mL/min   Anion gap 9 5 - 15    Comment: Performed at [facility], [place] [address] [place], [place] [address]  Heparin level (unfractionated)     Status: Abnormal   Collection Time: 06/19/18  6:32 AM  Result Value Ref Range   Heparin Unfractionated 0.29 (L) 0.30 - 0.70 IU/mL    Comment: (NOTE) If heparin results are below expected values, and patient dosage has  been confirmed, suggest follow up testing of antithrombin III levels. Performed at [facility], [place] [address], [place],  [address]     Imaging / Studies: No results found.  Medications / Allergies: per chart  Antibiotics: Anti-infectives (From admission, onward)   Start     Dose/Rate Route Frequency Ordered Stop   06/15/18 2359  metroNIDAZOLE (FLAGYL) IVPB 500 mg  Status:  Discontinued     500 mg 100 mL/hr over 60 Minutes Intravenous Every 8 hours 06/15/18 1655 06/16/18 0934   06/15/18 2300  piperacillin-tazobactam (ZOSYN) IVPB 3.375 g  Status:  Discontinued     3.375 g 12.5 mL/hr over 240 Minutes Intravenous Every 8 hours 06/15/18 2256 06/15/18 2310   06/15/18 2200  piperacillin-tazobactam (ZOSYN) IVPB 3.375 g  3.375 g 12.5 mL/hr over 240 Minutes Intravenous Every 8 hours 06/15/18 1743     06/15/18 1445  ceFEPIme (MAXIPIME) 2 g in sodium chloride 0.9 % 100 mL IVPB     2 g 200 mL/hr  over 30 Minutes Intravenous  Once 06/15/18 1431 06/15/18 1606   06/15/18 1445  metroNIDAZOLE (FLAGYL) IVPB 500 mg     500 mg 100 mL/hr over 60 Minutes Intravenous  Once 06/15/18 1431 06/15/18 1711        Note: Portions of this report may have been transcribed using voice recognition software. Every effort was made to ensure accuracy; however, inadvertent computerized transcription errors may be present.   Any transcriptional errors that result from this process are unintentional.     Adin Hector, MD, FACS, MASCRS Gastrointestinal and Minimally Invasive Surgery    [address], [place], [place] [address] [phone] Main / Paging [phone] Fax

## 2018-06-20 LAB — COMPREHENSIVE METABOLIC PANEL
ALT: 45 U/L — ABNORMAL HIGH (ref 0–44)
AST: 26 U/L (ref 15–41)
Albumin: 2.3 g/dL — ABNORMAL LOW (ref 3.5–5.0)
Alkaline Phosphatase: 131 U/L — ABNORMAL HIGH (ref 38–126)
Anion gap: 8 (ref 5–15)
BUN: 11 mg/dL (ref 8–23)
CO2: 22 mmol/L (ref 22–32)
Calcium: 8.1 mg/dL — ABNORMAL LOW (ref 8.9–10.3)
Chloride: 107 mmol/L (ref 98–111)
Creatinine, Ser: 0.68 mg/dL (ref 0.61–1.24)
GFR calc Af Amer: >60 mL/min (ref >60)
GFR calc non Af Amer: >60 mL/min (ref >60)
Glucose, Bld: 110 mg/dL — ABNORMAL HIGH (ref 70–99)
Potassium: 3.5 mmol/L (ref 3.5–5.1)
Sodium: 137 mmol/L (ref 135–145)
Total Bilirubin: 0.7 mg/dL (ref 0.3–1.2)
Total Protein: 5.4 g/dL — ABNORMAL LOW (ref 6.5–8.1)

## 2018-06-20 LAB — CBC
HCT: 38.6 % — ABNORMAL LOW (ref 39.0–52.0)
Hemoglobin: 12.9 g/dL — ABNORMAL LOW (ref 13.0–17.0)
MCH: 35.4 pg — ABNORMAL HIGH (ref 26.0–34.0)
MCHC: 33.4 g/dL (ref 30.0–36.0)
MCV: 106 fL — ABNORMAL HIGH (ref 80.0–100.0)
Platelets: 278 10*3/uL (ref 150–400)
RBC: 3.64 MIL/uL — ABNORMAL LOW (ref 4.22–5.81)
RDW: 12.9 % (ref 11.5–15.5)
WBC: 9.8 10*3/uL (ref 4.0–10.5)
nRBC: 0.2 % (ref 0.0–0.2)

## 2018-06-20 LAB — HEPARIN LEVEL (UNFRACTIONATED): Heparin Unfractionated: 0.31 IU/mL (ref 0.30–0.70)

## 2018-06-20 MED ORDER — HEPARIN (PORCINE) 25000 UT/250ML-% IV SOLN
1300.0000 [IU]/h | INTRAVENOUS | Status: DC
  Administered 2018-06-20: 1300 [IU]/h via INTRAVENOUS
  Filled 2018-06-20: qty 250

## 2018-06-20 MED ORDER — TAMSULOSIN HCL 0.4 MG PO CAPS: 0.4000 mg | ORAL_CAPSULE | Freq: Two times a day (BID) | ORAL | Status: DC

## 2018-06-20 MED ORDER — METOPROLOL TARTRATE 5 MG/5ML IV SOLN
10.0000 mg | Freq: Three times a day (TID) | INTRAVENOUS | Status: DC
  Administered 2018-06-20: 10 mg via INTRAVENOUS
  Filled 2018-06-20: qty 10

## 2018-06-20 MED ORDER — DIPHENHYDRAMINE HCL 50 MG/ML IJ SOLN
25.0000 mg | Freq: Once | INTRAMUSCULAR | Status: AC
  Administered 2018-06-20: 25 mg via INTRAVENOUS
  Filled 2018-06-20: qty 1

## 2018-06-20 MED ORDER — METHYLPREDNISOLONE SODIUM SUCC 40 MG IJ SOLR
20.0000 mg | Freq: Every day | INTRAMUSCULAR | Status: DC
  Administered 2018-06-20 – 2018-06-22 (×3): 20 mg via INTRAVENOUS
  Filled 2018-06-20 (×3): qty 1

## 2018-06-20 MED ORDER — HYDROMORPHONE HCL 1 MG/ML IJ SOLN
1.0000 mg | INTRAMUSCULAR | Status: DC | PRN
  Administered 2018-06-20: 2 mg via INTRAVENOUS
  Administered 2018-06-20 – 2018-06-21 (×3): 1 mg via INTRAVENOUS
  Administered 2018-06-21: 2 mg via INTRAVENOUS
  Administered 2018-06-22 – 2018-06-23 (×4): 1 mg via INTRAVENOUS
  Filled 2018-06-20 (×2): qty 2
  Filled 2018-06-20 (×3): qty 1
  Filled 2018-06-20 (×3): qty 2
  Filled 2018-06-20 (×2): qty 1

## 2018-06-20 MED ORDER — DILTIAZEM HCL ER COATED BEADS 240 MG PO CP24: 240.0000 mg | ORAL_CAPSULE | Freq: Every day | ORAL | Status: DC

## 2018-06-20 MED ORDER — METHOCARBAMOL 1000 MG/10ML IJ SOLN
1000.0000 mg | Freq: Three times a day (TID) | INTRAVENOUS | Status: DC
  Administered 2018-06-20 – 2018-06-24 (×12): 1000 mg via INTRAVENOUS
  Filled 2018-06-20 (×16): qty 10

## 2018-06-20 MED ORDER — PREDNISONE 10 MG PO TABS: 10.0000 mg | ORAL_TABLET | Freq: Every day | ORAL | Status: DC

## 2018-06-20 MED ORDER — ALBUTEROL SULFATE (2.5 MG/3ML) 0.083% IN NEBU: 3.0000 mL | INHALATION_SOLUTION | RESPIRATORY_TRACT | Status: DC | PRN

## 2018-06-20 MED ORDER — GABAPENTIN 300 MG PO CAPS: 300.0000 mg | ORAL_CAPSULE | Freq: Every day | ORAL | Status: DC

## 2018-06-20 MED ORDER — METOPROLOL TARTRATE 5 MG/5ML IV SOLN
10.0000 mg | Freq: Four times a day (QID) | INTRAVENOUS | Status: DC
  Administered 2018-06-20 – 2018-06-23 (×10): 10 mg via INTRAVENOUS
  Filled 2018-06-20 (×11): qty 10

## 2018-06-20 NOTE — Progress Notes (Signed)
No residuals noted from NG tube. Pt has had multiple episodes of severe cramping with PO intake, relieved by dilaudid. Johonna Binette, Bing Neighbors, RN

## 2018-06-20 NOTE — Consult Note (Addendum)
[place] Nurse wound consult note Reason for Consult: Surgical team is following for assessment and plan of care for abd wound.  Vac dressing changed today. Wound type: Full thickness post-op midline abd wound; wound appearance unchanged from previous assessment. Wound bed: beefy red Drainage (amount, consistency, odor) small amt bloody drainage.  Mod sized old clotted blood was underneath the previous track pad when it was removed. Periwound: intact skin surrounding Dressing procedure/placement/frequency: Applied one piece black foam dressing to 147mm cont suction.  Pt was medicated for pain prior to the procedure and tolerated with mod amt discomfort. WOC will plan to change dressing again on Mon with next pouch change and teaching session.  Wound is fairly shallow and pt could possibly discharge with moist gauze dressing when ready for home, if surgical team agrees. If patient continues to have small amt bloody drainage, then this may clog the track pad or suction cannister and the Vac would need to be removed if this occurs, since it will not function properly under those conditions. Discussed bleeding with surgical PA via phone.  [place] Nurse ostomy follow up Stoma type/location: Stoma is 20% yellow, 80% red and viable, flush with skin level, 1 1/2 inches. Peristomal assessment:  Intact skin surrounding Output: small amt brown semi formed stool Ostomy pouching: 1pc.  Education provided: Wife assisted with pouch application. Pouch change performed using one piece pouch and added barrier ring to attempt to maintain a seal.  Wife was able to open and close velcro to empty. Pt did not assist but asked appropriate questions. Educational materials left at the bedside with extra supplies. Reviewed pouching routines and ordering supplies. Will perform another pouch change demonstration on Mon with next Vac change. Recommend home health after discharge; please order if desired.  Enrolled patient in [facility] Discharge program: Yes Julien Girt MSN, RN, [place], [place], [place]

## 2018-06-20 NOTE — Progress Notes (Signed)
Resumed care for pt. Agree with previous RN's assessment. Will continue with plan of care for pt.

## 2018-06-20 NOTE — Progress Notes (Signed)
[place] for Heparin Indication: atrial fibrillation (PTA Eliquis on hold)  Allergies  Allergen Reactions  . Lidocaine Hcl Anaphylaxis and Other (See Comments)    Xylocaine  . Xylocaine [Lidocaine] Anaphylaxis  . Codeine Nausea And Vomiting  . Crestor [Rosuvastatin Calcium] Other (See Comments)    All-over body aches Myalgias   . Statins Other (See Comments)    Muscle soreness and an aching feeling all over Myalgias  . Percocet [Oxycodone-Acetaminophen] Itching    Patient Measurements: Height: 5\' 9"  (175.3 cm) Weight: 182 lb 15.7 oz (83 kg) IBW/kg (Calculated) : 70.7 Heparin Dosing Weight: TBW  Vital Signs: Temp: 98.9 F (37.2 C) (02/07 0447) Temp Source: Oral (02/07 0447) BP: 155/87 (02/07 0447) Pulse Rate: 75 (02/07 0447)  Labs: Recent Labs    06/17/18 1142 06/17/18 2138  06/18/18 0426 06/19/18 0404 06/19/18 0092 06/20/18 0410  HGB  --   --    < > 11.7* 12.4*  --  12.9*  HCT  --   --   --  36.1* 37.5*  --  38.6*  PLT  --   --   --  219 228  --  278  APTT 28 65*  --   --   --   --   --   HEPARINUNFRC  --  0.52  --  0.40  --  0.29* 0.31  CREATININE  --   --   --  0.65 0.60*  --  0.68   < > = values in this interval not displayed.    Estimated Creatinine Clearance: 85.9 mL/min (by C-G formula based on SCr of 0.68 mg/dL).  Assessment: 60 yoM admitted on 2/2 for sigmoid colon perforation s/p Hartman colectomy/colostomy 06/15/2018 with ileus.  PMH significant for Afib on Eliquis prior to admission;  Eliquis was held and [facility] given on 2/2 prior to emergent surgery.  Pharmacy is now consulted to dose Heparin IV while NPO and unable to take Eliquis.  CCS ok with starting Heparin today as well.  PTA Eliquis 5mg  BID. Last dose on 2/2 at 0800.  Today, 06/20/2018:  Heparin level at low end of therapeutic range (0.31) on 1200 units/hr  CBC: Hgb 12.9, Plt wnl (stable  No bleeding or complications reported.  SCr 0.68,  stable  Noted to be tolerating clear liquids  Goal of Therapy:  Heparin level 0.3-0.7 units/ml Monitor platelets by anticoagulation protocol: Yes   Plan:   Increase heparin infusion slightly to 1300 units/hr  Daily CBC and heparin level  F/u ability to resume Eliquis  Peggyann Juba, PharmD, BCPS Pager: [phone] 06/20/2018 7:00 AM

## 2018-06-20 NOTE — Progress Notes (Signed)
Occupational Therapy Treatment Patient Details Name: [name] MRN: [id number] DOB: 09/14/1945 Today's Date: 06/20/2018    History of present illness 73 year old male with a history of interstitial lung disease, A. 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 Dx of bowel perforation, s/p colectomy/colostomy on 06/15/18.   OT comments  Goal added for BUE exercise. Orange theraband issued  Follow Up Recommendations  Supervision/Assistance - 24 hour    Equipment Recommendations  None recommended by OT    Recommendations for Other Services      Precautions / Restrictions Precautions Precautions: Other (comment) Precaution Comments: abdominal surgery, wound vac; NG tube       Mobility Bed Mobility Overal bed mobility: Needs Assistance Bed Mobility: Supine to Sit;Sit to Supine     Supine to sit: Supervision Sit to supine: Min guard   General bed mobility comments: difficulty bringing LEs onto bed  Transfers Overall transfer level: Needs assistance Equipment used: Rolling walker (2 wheeled) Transfers: Sit to/from Stand Sit to Stand: Min guard         General transfer comment: min/guard for safety, verbal cues for hand placement        ADL either performed or assessed with clinical judgement   ADL Overall ADL's : Needs assistance/impaired     Grooming: Set up;Bed level                                 General ADL Comments: Pt complaining of belly pain but did agree to BUE exercise.  OT issued orange theraband and educated on BUE exercise incorporating shoulder and elbow.  Pt did well and agreed to perform these exercises when in bed or sitting in chair.     Vision Baseline Vision/History: No visual deficits            Cognition Arousal/Alertness: Awake/alert Behavior During Therapy: WFL for tasks assessed/performed Overall Cognitive Status: Within Functional  Limits for tasks assessed                                          Exercises Shoulder Exercises Shoulder Flexion: AROM;Theraband;10 reps Theraband Level (Shoulder Flexion): Level 2 (Red) Shoulder ABduction: AROM;10 reps;Theraband Elbow Flexion: AROM;10 reps;Theraband Theraband Level (Elbow Flexion): Level 2 (Red) Elbow Extension: AROM;10 reps;Theraband Theraband Level (Elbow Extension): Level 2 (Red)   Shoulder Instructions       General Comments      Pertinent Vitals/ Pain       Pain Assessment: 0-10 Pain Score: 8  Pain Location: abd pain(esp with coughing) Pain Descriptors / Indicators: Sore Pain Intervention(s): Premedicated before session;Monitored during session;Repositioned;Limited activity within patient's tolerance         Frequency  Min 2X/week        Progress Toward Goals  OT Goals(current goals can now be found in the care plan section)  Progress towards OT goals: Progressing toward goals     Plan Discharge plan remains appropriate       AM-PAC OT "6 Clicks" Daily Activity     Outcome Measure   Help from another person eating meals?: Total(NPO) Help from another person taking care of personal grooming?: A Little Help from another person toileting, which includes using toliet, bedpan, or urinal?: A Lot Help from another person bathing (  including washing, rinsing, drying)?: A Lot Help from another person to put on and taking off regular upper body clothing?: A Lot Help from another person to put on and taking off regular lower body clothing?: A Lot 6 Click Score: 12    End of Session    OT Visit Diagnosis: Muscle weakness (generalized) (M62.81);Pain Pain - part of body: (abdomen)   Activity Tolerance Patient limited by pain   Patient Left in bed;with call bell/phone within reach;with bed alarm set   Nurse Communication Mobility status        Time: 3151-7616 OT Time Calculation (min): 20 min  Charges: OT General  Charges $OT Visit: 1 Visit OT Treatments $Therapeutic Exercise: 8-22 mins  Kari Baars, [place] [phone] Office- [place], Edwena Felty D 06/20/2018, 1:04 PM

## 2018-06-20 NOTE — Progress Notes (Signed)
PROGRESS NOTE  [name]:[id number] DOB: 24-Oct-1945 DOA: 06/15/2018 PCP: [name], MD  HPI/Recap of past 24 hours: Patient is 73 year old male with a history of interstitial lung disease on prednisone, CellCept, and nintedanib,A. 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  No nausea vomiting fevers.  He does have chronic shortness of breath due to ILD. CT abdomen pelvis showed proximal sigmoid diverticulitis with large amount of free air in the adjacent peritoneal fat and extending in the epigastric region consistent with perforation.  5.2x 4.3 cm fluid collection with air-fluid levels in the area concerning for contained perforation or developing abscess.  Mild small bowel dilatation may represent ileus or possibly obstruction.  06/18/2018: Patient seen and examined with his wife at bedside.  No abdominal pain or nausea at the time of this visit.  Denies chest pain, palpitation or dyspnea at rest.  06/19/2018: Patient seen and examined at his bedside.  No acute events overnight.  Pain is well controlled with current pain management.  NG tube is clamped this morning and started on clear liquid per General surgery.  Stool output noted in colostomy bag.  06/20/2018: Patient seen and examined at his bedside.  Reports significant lower abdominal pain post ingestion of clear liquid diet.  IV Dilaudid dose and frequency increased with some improvement.  We will continue to closely monitor on telemetry.  Patient continues to be on heparin drip.   Assessment/Plan: Principal Problem:   Sigmoid colon perforation s/p Hartmann colectomy/colostomy 06/15/2018 Active Problems:   Abnormal LFTs   Essential hypertension   ILD (interstitial lung disease) (HCC)   PAF (paroxysmal atrial fibrillation) (HCC)   OSA (obstructive sleep apnea)  Acute sigmoid diverticulitis  with perforation s/p Hartmann colectomy/colostomy 06/15/2018 with ileus -Underwent exploratory laparotomy with sigmoid colon resection and end colostomy -Intra-Op culture report E. coli which is sensitive to Zosyn -Continue Zosyn for a minimum of 5 days as recommended by general surgery -Postop day #5, management per general surgery -Mobilize per surgery -Afebrile, no leukocytosis -Hemoglobin is stable -VAC changes Monday Wednesday Friday as recommended by general surgery -Hold off clear liquid diet due to significant pain post feeding -Pain management in place   Active Problems:   Abnormal LFTs -In the setting of acute diverticulitis, perforation -Acute hepatitis panel negative, follow LFTs -ALT trending down  Uncontrolled hypertension Pain may be contributing to uncontrolled hypertension Increase IV metoprolol to 10 mg every 6 hours  Continue to closely monitor vital signs on telemetry Hold p.o. medications, unable to tolerate p.o.    ILD (interstitial lung disease) ([name]) -Continue to hold CellCept, nintedanib, has been on prednisone for many years -Continue low-dose Solu-Medrol to avoid adrenal insufficiency  -Continue Symbicort, nebulizer treatments    PAF (paroxysmal atrial fibrillation) (HCC) -Continue IV Lopressor, continue to hold Eliquis - Continue heparin drip  Code Status: Full CODE STATUS DVT Prophylaxis: Heparin drip Family Communication:  Wife at bedside.  All questions answered to her satisfaction. Disposition Plan: Home versus SNF in 2-3 days when hemodynamically stable or when general surgery signs off.    Procedures:  Expiratory laparotomy with sigmoid colon resection and colostomy  Consultants:   General surgery    Objective: Vitals:   06/20/18 0447 06/20/18 0818 06/20/18 1150 06/20/18 1403  BP: (!) 155/87  (!) 151/92 (!) 157/99  Pulse: 75  92 86  Resp: 15  16  Temp: 98.9 F (37.2 C)   98.7 F (37.1 C)  TempSrc: Oral   Oral  SpO2:  92% 95%  95%  Weight:      Height:        Intake/Output Summary (Last 24 hours) at 06/20/2018 1430 Last data filed at 06/20/2018 1201 Gross per 24 hour  Intake 2048.13 ml  Output 2175 ml  Net -126.87 ml   Filed Weights   06/15/18 1758  Weight: 83 kg    Exam:  . General: 73 y.o. year-old male well-developed well-nourished appears uncomfortable due to significant pain in abdomen . Cardiovascular: Regular rate and rhythm with no rubs or gallops.  No JVD or thyromegaly . Respiratory: Clear to auscultation with no wheezes or rales.  Poor inspiratory effort. . Abdomen: Soft nontender.  Ostomy pink.  Dressing appears intact.  Positive bowel sounds.  Wound VAC in place.  Colostomy bag in place with mild brown stool output. . Musculoskeletal: No cyanosis or clubbing.  No pedal edema bilaterally. Marland Kitchen Psychiatry: Mood is appropriate for condition and setting   Data Reviewed: CBC: Recent Labs  Lab 06/16/18 0336 06/17/18 0242 06/18/18 0426 06/19/18 0404 06/20/18 0410  WBC 12.5* 14.8* 12.9* 9.1 9.8  HGB 13.5 12.3* 11.7* 12.4* 12.9*  HCT 40.3 37.3* 36.1* 37.5* 38.6*  MCV 108.0* 109.7* 109.7* 107.4* 106.0*  PLT 195 196 219 228 951   Basic Metabolic Panel: Recent Labs  Lab 06/16/18 0336 06/17/18 0242 06/18/18 0426 06/19/18 0404 06/20/18 0410  NA 136 137 139 138 137  K 4.6 3.9 4.5 3.9 3.5  CL 107 106 108 108 107  CO2 22 22 23  21* 22  GLUCOSE 207* 204* 106* 102* 110*  BUN 8 12 12 11 11   CREATININE 0.75 0.68 0.65 0.60* 0.68  CALCIUM 6.9* 7.2* 7.7* 7.9* 8.1*   GFR: Estimated Creatinine Clearance: 85.9 mL/min (by C-G formula based on SCr of 0.68 mg/dL). Liver Function Tests: Recent Labs  Lab 06/15/18 1355 06/16/18 0336 06/18/18 0426 06/20/18 0410  AST 48* 70* 40 26  ALT 108* 106* 59* 45*  ALKPHOS 124 90 97 131*  BILITOT 1.6* 1.3* 0.7 0.7  PROT 6.5 5.3* 5.1* 5.4*  ALBUMIN 3.0* 2.1* 2.0* 2.3*   Recent Labs  Lab 06/15/18 1355  LIPASE 29   No results for input(s):  AMMONIA in the last 168 hours. Coagulation Profile: No results for input(s): INR, PROTIME in the last 168 hours. Cardiac Enzymes: No results for input(s): CKTOTAL, CKMB, CKMBINDEX, TROPONINI in the last 168 hours. BNP (last 3 results) No results for input(s): PROBNP in the last 8760 hours. HbA1C: No results for input(s): HGBA1C in the last 72 hours. CBG: No results for input(s): GLUCAP in the last 168 hours. Lipid Profile: No results for input(s): CHOL, HDL, LDLCALC, TRIG, CHOLHDL, LDLDIRECT in the last 72 hours. Thyroid Function Tests: No results for input(s): TSH, T4TOTAL, FREET4, T3FREE, THYROIDAB in the last 72 hours. Anemia Panel: No results for input(s): VITAMINB12, FOLATE, FERRITIN, TIBC, IRON, RETICCTPCT in the last 72 hours. Urine analysis:    Component Value Date/Time   COLORURINE YELLOW 02/17/2018 1404   APPEARANCEUR CLEAR 02/17/2018 1404   LABSPEC 1.020 02/17/2018 1404   PHURINE 5.0 02/17/2018 1404   GLUCOSEU NEGATIVE 02/17/2018 1404   HGBUR NEGATIVE 02/17/2018 1404   BILIRUBINUR NEGATIVE 02/17/2018 1404   KETONESUR NEGATIVE 02/17/2018 1404   PROTEINUR NEGATIVE 02/17/2018 1404   UROBILINOGEN 0.2 01/22/2007 0720   NITRITE NEGATIVE 02/17/2018 1404   LEUKOCYTESUR NEGATIVE 02/17/2018 1404  Sepsis Labs: @LABRCNTIP (procalcitonin:4,lacticidven:4)  ) Recent Results (from the past 240 hour(s))  Aerobic/Anaerobic Culture (surgical/deep wound)     Status: None   Collection Time: 06/15/18  8:17 PM  Result Value Ref Range Status   Specimen Description   Final    ABSCESS Performed at [facility] [address], [name], [place] [address]    Special Requests   Final    NONE Performed at [facility], [place] [address] [place], [place] [address]    Gram Stain   Final    MODERATE WBC PRESENT, PREDOMINANTLY PMN FEW GRAM POSITIVE COCCI FEW GRAM POSITIVE RODS Performed at [facility], [place] [address] [place], [place]  [address]    Culture   Final    FEW ESCHERICHIA COLI MIXED ANAEROBIC FLORA PRESENT.  CALL LAB IF FURTHER IID REQUIRED.    Report Status 06/18/2018 FINAL  Final   Organism ID, Bacteria ESCHERICHIA COLI  Final      Susceptibility   Escherichia coli - MIC*    AMPICILLIN >=32 RESISTANT Resistant     CEFAZOLIN <=4 SENSITIVE Sensitive     CEFEPIME <=1 SENSITIVE Sensitive     CEFTAZIDIME <=1 SENSITIVE Sensitive     CEFTRIAXONE <=1 SENSITIVE Sensitive     CIPROFLOXACIN <=0.25 SENSITIVE Sensitive     GENTAMICIN >=16 RESISTANT Resistant     IMIPENEM <=0.25 SENSITIVE Sensitive     TRIMETH/SULFA >=320 RESISTANT Resistant     AMPICILLIN/SULBACTAM >=32 RESISTANT Resistant     PIP/TAZO <=4 SENSITIVE Sensitive     Extended ESBL NEGATIVE Sensitive     * FEW ESCHERICHIA COLI  MRSA PCR Screening     Status: None   Collection Time: 06/15/18 10:22 PM  Result Value Ref Range Status   MRSA by PCR NEGATIVE NEGATIVE Final    Comment:        The GeneXpert MRSA Assay (FDA approved for NASAL specimens only), is one component of a comprehensive MRSA colonization surveillance program. It is not intended to diagnose MRSA infection nor to guide or monitor treatment for MRSA infections. Performed at [facility], [place] [address], [place], [place] [address]       Studies: No results found.  Scheduled Meds: . albuterol  2.5 mg Nebulization TID  . lip balm  1 application Topical BID  . methylPREDNISolone (SOLU-MEDROL) injection  20 mg Intravenous Daily  . metoprolol tartrate  10 mg Intravenous Q6H  . mometasone-formoterol  2 puff Inhalation BID  . pantoprazole (PROTONIX) IV  40 mg Intravenous QHS    Continuous Infusions: . sodium chloride 50 mL/hr at 06/20/18 1147  . heparin 1,300 Units/hr (06/20/18 1353)  . methocarbamol (ROBAXIN) IV    . ondansetron (ZOFRAN) IV    . piperacillin-tazobactam (ZOSYN)  IV 3.375 g (06/20/18 1351)     LOS: 5 days     Kayleen Memos, MD [facility] Pager [phone]  If 7PM-7AM, please contact night-coverage [email or web address] Password [email or web address] 06/20/2018, 2:30 PM

## 2018-06-20 NOTE — Progress Notes (Signed)
Physical Therapy Treatment Patient Details Name: [name] MRN: [id number] DOB: 1945/12/11 Today's Date: 06/20/2018    History of Present Illness 73 year old male with a history of interstitial lung disease, A. 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 Dx of bowel perforation, s/p colectomy/colostomy on 06/15/18.    PT Comments    Pt ambulated in hallway and tolerated good distance.  Pt encouraged to continue ambulating multiple times daily.   Follow Up Recommendations  No PT follow up     Equipment Recommendations  Rolling walker with 5" wheels    Recommendations for Other Services       Precautions / Restrictions Precautions Precautions: Other (comment) Precaution Comments: abdominal surgery, wound vac; NG tube    Mobility  Bed Mobility Overal bed mobility: Needs Assistance Bed Mobility: Supine to Sit;Sit to Supine     Supine to sit: Supervision Sit to supine: Min guard   General bed mobility comments: difficulty bringing LEs onto bed  Transfers Overall transfer level: Needs assistance Equipment used: Rolling walker (2 wheeled) Transfers: Sit to/from Stand Sit to Stand: Min guard         General transfer comment: min/guard for safety, verbal cues for hand placement  Ambulation/Gait Ambulation/Gait assistance: Min guard Gait Distance (Feet): 400 Feet Assistive device: Rolling walker (2 wheeled) Gait Pattern/deviations: Step-through pattern;Decreased stride length     General Gait Details: steady with RW, assist for line management; tolerated improved distance, denies symptoms   Stairs             Wheelchair Mobility    Modified Rankin (Stroke Patients Only)       Balance                                            Cognition Arousal/Alertness: Awake/alert Behavior During Therapy: WFL for tasks assessed/performed Overall Cognitive Status:  Within Functional Limits for tasks assessed                                        Exercises      General Comments        Pertinent Vitals/Pain Pain Assessment: 0-10 Pain Score: 8  Pain Location: abd pain(esp with coughing) Pain Descriptors / Indicators: Sore Pain Intervention(s): Premedicated before session;Monitored during session;Repositioned;Limited activity within patient's tolerance    Home Living                      Prior Function            PT Goals (current goals can now be found in the care plan section) Progress towards PT goals: Progressing toward goals    Frequency    Min 3X/week      PT Plan Current plan remains appropriate    Co-evaluation              AM-PAC PT "6 Clicks" Mobility   Outcome Measure  Help needed turning from your back to your side while in a flat bed without using bedrails?: A Little Help needed moving from lying on your back to sitting on the side of a flat bed without using bedrails?: A Little Help needed moving to and from a bed to a chair (  including a wheelchair)?: A Little Help needed standing up from a chair using your arms (e.g., wheelchair or bedside chair)?: A Little Help needed to walk in hospital room?: A Little Help needed climbing 3-5 steps with a railing? : A Little 6 Click Score: 18    End of Session   Activity Tolerance: Patient tolerated treatment well Patient left: in bed;with call bell/phone within reach;with family/visitor present Nurse Communication: Mobility status PT Visit Diagnosis: Difficulty in walking, not elsewhere classified (R26.2)     Time: 1115-1130 PT Time Calculation (min) (ACUTE ONLY): 15 min  Charges:  $Gait Training: 8-22 mins                     Carmelia Bake, PT, [place] Office: [phone] Pager: [place] E 06/20/2018, 12:42 PM

## 2018-06-20 NOTE — Telephone Encounter (Signed)
Pt is currently admitted.  Will need to f/u with pt after discharge, as he is not sure what medications he is taking.

## 2018-06-20 NOTE — Progress Notes (Signed)
No residuals noted from NG tube. Pt with no complaints of N/V during shift. Pain managed with PRN dilaudid. Will continue to monitor closely.

## 2018-06-21 ENCOUNTER — Inpatient Hospital Stay (HOSPITAL_COMMUNITY): Payer: PPO

## 2018-06-21 LAB — COMPREHENSIVE METABOLIC PANEL
ALT: 46 U/L — ABNORMAL HIGH (ref 0–44)
AST: 50 U/L — ABNORMAL HIGH (ref 15–41)
Albumin: 2.4 g/dL — ABNORMAL LOW (ref 3.5–5.0)
Alkaline Phosphatase: 184 U/L — ABNORMAL HIGH (ref 38–126)
Anion gap: 9 (ref 5–15)
BUN: 11 mg/dL (ref 8–23)
CO2: 23 mmol/L (ref 22–32)
Calcium: 8.1 mg/dL — ABNORMAL LOW (ref 8.9–10.3)
Chloride: 105 mmol/L (ref 98–111)
Creatinine, Ser: 0.7 mg/dL (ref 0.61–1.24)
GFR calc Af Amer: >60 mL/min (ref >60)
GFR calc non Af Amer: >60 mL/min (ref >60)
Glucose, Bld: 100 mg/dL — ABNORMAL HIGH (ref 70–99)
Potassium: 4.3 mmol/L (ref 3.5–5.1)
Sodium: 137 mmol/L (ref 135–145)
Total Bilirubin: 2.5 mg/dL — ABNORMAL HIGH (ref 0.3–1.2)
Total Protein: 5.4 g/dL — ABNORMAL LOW (ref 6.5–8.1)

## 2018-06-21 LAB — CBC
HCT: 38.8 % — ABNORMAL LOW (ref 39.0–52.0)
Hemoglobin: 13 g/dL (ref 13.0–17.0)
MCH: 36.7 pg — ABNORMAL HIGH (ref 26.0–34.0)
MCHC: 33.5 g/dL (ref 30.0–36.0)
MCV: 109.6 fL — ABNORMAL HIGH (ref 80.0–100.0)
Platelets: 291 10*3/uL (ref 150–400)
RBC: 3.54 MIL/uL — ABNORMAL LOW (ref 4.22–5.81)
RDW: 12.9 % (ref 11.5–15.5)
WBC: 10.6 10*3/uL — ABNORMAL HIGH (ref 4.0–10.5)
nRBC: 0 % (ref 0.0–0.2)

## 2018-06-21 LAB — HEPARIN LEVEL (UNFRACTIONATED): Heparin Unfractionated: 0.42 IU/mL (ref 0.30–0.70)

## 2018-06-21 MED ORDER — TRAZODONE HCL 50 MG PO TABS
50.0000 mg | ORAL_TABLET | Freq: Once | ORAL | Status: AC
  Administered 2018-06-21: 50 mg via ORAL
  Filled 2018-06-21: qty 1

## 2018-06-21 NOTE — Progress Notes (Signed)
Occupational Therapy Treatment Patient Details Name: [name] MRN: [id number] DOB: 14-May-1946 Today's Date: 06/21/2018    History of present illness 73 year old male with a history of interstitial lung disease, A. 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 Dx of bowel perforation, s/p colectomy/colostomy on 06/15/18.   OT comments  Pt progressing towards OT goals, pleasant and willing to work with therapy. Pt requesting to mobilize into hallway this session, performing functional mobility using RW at overall supervision level. Pt excited about recent removal of NG tube. Continued education provided re: activity progression after return home. Pt fatigued with activity today but motivated to return to PLOF. Will continue per POC.   Follow Up Recommendations  Supervision/Assistance - 24 hour    Equipment Recommendations  None recommended by OT          Precautions / Restrictions Precautions Precautions: Other (comment) Precaution Comments: abdominal surgery, wound vac Restrictions Weight Bearing Restrictions: No       Mobility Bed Mobility Overal bed mobility: Needs Assistance Bed Mobility: Sit to Supine       Sit to supine: Min guard   General bed mobility comments: for lines/safety  Transfers Overall transfer level: Needs assistance Equipment used: Rolling walker (2 wheeled) Transfers: Sit to/from Stand Sit to Stand: Min guard         General transfer comment: min/guard for safety, verbal cues for hand placement    Balance Overall balance assessment: Mild deficits observed, not formally tested                                         ADL either performed or assessed with clinical judgement   ADL Overall ADL's : Needs assistance/impaired Eating/Feeding: Set up;Sitting Eating/Feeding Details (indicate cue type and reason): pt eating italian ice upon entry             Upper Body Dressing : Set up;Minimal assistance;Sitting;Standing Upper Body Dressing Details (indicate cue type and reason): for donning/doffing additional gown                 Functional mobility during ADLs: Supervision/safety;Rolling walker General ADL Comments: improvements in mobility and activity tolerance today, pt requesting to mobilize in hallway this session     Vision       Perception     Praxis      Cognition Arousal/Alertness: Awake/alert Behavior During Therapy: WFL for tasks assessed/performed Overall Cognitive Status: Within Functional Limits for tasks assessed                                          Exercises     Shoulder Instructions       General Comments      Pertinent Vitals/ Pain       Pain Assessment: Faces Faces Pain Scale: Hurts little more Pain Location: abd pain Pain Descriptors / Indicators: Sore Pain Intervention(s): Monitored during session;Limited activity within patient's tolerance  Home Living                                          Prior Functioning/Environment  Frequency  Min 2X/week        Progress Toward Goals  OT Goals(current goals can now be found in the care plan section)  Progress towards OT goals: Progressing toward goals  Acute Rehab OT Goals Patient Stated Goal: return to independence with mobility OT Goal Formulation: With patient Time For Goal Achievement: 07/01/18 Potential to Achieve Goals: Good  Plan Discharge plan remains appropriate    Co-evaluation                 AM-PAC OT "6 Clicks" Daily Activity     Outcome Measure   Help from another person eating meals?: None Help from another person taking care of personal grooming?: None Help from another person toileting, which includes using toliet, bedpan, or urinal?: A Little Help from another person bathing (including washing, rinsing, drying)?: A Little Help from another  person to put on and taking off regular upper body clothing?: None Help from another person to put on and taking off regular lower body clothing?: A Lot 6 Click Score: 20    End of Session Equipment Utilized During Treatment: Rolling walker  OT Visit Diagnosis: Muscle weakness (generalized) (M62.81);Pain Pain - part of body: (abdomen)   Activity Tolerance Patient tolerated treatment well   Patient Left in bed;with call bell/phone within reach;with family/visitor present   Nurse Communication Mobility status        Time: 8675-4492 OT Time Calculation (min): 16 min  Charges: OT General Charges $OT Visit: 1 Visit OT Treatments $Therapeutic Activity: 8-22 mins  Lou Cal, OT [facility] Pager [phone] Office [phone]    Raymondo Band 06/21/2018, 4:01 PM

## 2018-06-21 NOTE — Progress Notes (Signed)
Report received from A. Dickey, RN. No change from initial pm assessment. Will continue to monitor and follow the POC. 

## 2018-06-21 NOTE — Progress Notes (Signed)
PROGRESS NOTE  [name] FXT:[id number] DOB: [birth year] DOA: 06/15/2018 PCP: [name], MD  HPI/Recap of past 24 hours: Patient is 73 year old male with a history of interstitial lung disease on prednisone, CellCept, and nintedanib,A. 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  No nausea vomiting fevers.  He does have chronic shortness of breath due to ILD. CT abdomen pelvis showed proximal sigmoid diverticulitis with large amount of free air in the adjacent peritoneal fat and extending in the epigastric region consistent with perforation.  5.2x 4.3 cm fluid collection with air-fluid levels in the area concerning for contained perforation or developing abscess.  Mild small bowel dilatation may represent ileus or possibly obstruction.  06/18/2018: No abdominal pain or nausea at the time of this visit.   06/19/2018: NG tube is clamped this morning and started on clear liquid per General surgery.  Stool output noted in colostomy bag. 06/20/2018: Reports significant lower abdominal pain post ingestion of clear liquid diet. Patient on heparin drip.  06/21/18: Noted bleeding from wound vac this am.  Held heparin drip promptly.  Minimally symptomatic.  Denies abdominal pain after taking pain medications earlier.  Assessment/Plan: Principal Problem:   Sigmoid colon perforation s/p Hartmann colectomy/colostomy 06/15/2018 Active Problems:   Abnormal LFTs   Essential hypertension   ILD (interstitial lung disease) (HCC)   PAF (paroxysmal atrial fibrillation) (HCC)   OSA (obstructive sleep apnea)  Acute sigmoid diverticulitis with perforation s/p Hartmann colectomy/colostomy 06/15/2018 with ileus -Underwent exploratory laparotomy with sigmoid colon resection and end colostomy -Intra-Op culture report E. coli which is sensitive to Zosyn -Continue Zosyn for a minimum of 5  days as recommended by general surgery -Postop day #6, management per general surgery -Mobilize per surgery -Afebrile, no leukocytosis -Hemoglobin is stable -VAC changes Monday Wednesday Friday as recommended by general surgery -Restart clear liquid diet as recommended by general surgery -Ileus appears to be resolving, DC NG tube  Acute blood loss from wound VAC Hold heparin drip today Continue to closely monitor General surgery following Hemoglobin stable this morning 13 from 12.9  Active Problems:   Abnormal LFTs -In the setting of acute diverticulitis, perforation -Acute hepatitis panel negative, follow LFTs -ALT trending down  Uncontrolled hypertension Pain may be contributing to uncontrolled hypertension Increase IV metoprolol to 10 mg every 6 hours  Continue to closely monitor vital signs on telemetry    ILD (interstitial lung disease) ([place]) -Continue to hold CellCept, nintedanib, has been on prednisone for many years -Continue low-dose Solu-Medrol to avoid adrenal insufficiency  -Continue Symbicort, nebulizer treatments    PAF (paroxysmal atrial fibrillation) ([place]) -Continue IV Lopressor, continue to hold Eliquis - Hold heparin drip today 06/21/2018  Code Status: Full CODE STATUS DVT Prophylaxis: Heparin drip Family Communication:  Wife at bedside.  All questions answered to her satisfaction. Disposition Plan: Home versus SNF in 2-3 days when hemodynamically stable or when general surgery signs off.    Procedures:  Expiratory laparotomy with sigmoid colon resection and colostomy  Consultants:   General surgery    Objective: Vitals:   06/21/18 0651 06/21/18 0857 06/21/18 1237 06/21/18 1414  BP: (!) 142/73 (!) 150/89 (!) 169/93   Pulse: 74 77 78   Resp: 17 16 16    Temp: 98.3 F (36.8 C) 97.9 F (36.6 C) 97.6 F (36.4 C)   TempSrc:  Axillary Oral   SpO2: 93% 95%  94% 97%  Weight:      Height:        Intake/Output Summary (Last 24 hours) at  06/21/2018 1734 Last data filed at 06/21/2018 0919 Gross per 24 hour  Intake 350 ml  Output 2125 ml  Net -1775 ml   Filed Weights   06/15/18 1758  Weight: 83 kg    Exam:  . General: 73 y.o. year-old male well-developed well-nourished in no acute distress.  Alert and oriented x3.   . Cardiovascular: Regular rate and rhythm with no rubs or gallops.  No JVD or thyromegaly . Respiratory: Clear to Auscultation with No Wheezes or Rales.  Poor respiratory effort. . Abdomen: Soft nontender.  Bleeding around wound VAC.  Positive bowel sounds. Colostomy bag in place with mild brown stool output. . Musculoskeletal: No cyanosis or clubbing.  No pedal edema bilaterally. Marland Kitchen Psychiatry: Mood is appropriate for condition and setting   Data Reviewed: CBC: Recent Labs  Lab 06/17/18 0242 06/18/18 0426 06/19/18 0404 06/20/18 0410 06/21/18 0250  WBC 14.8* 12.9* 9.1 9.8 10.6*  HGB 12.3* 11.7* 12.4* 12.9* 13.0  HCT 37.3* 36.1* 37.5* 38.6* 38.8*  MCV 109.7* 109.7* 107.4* 106.0* 109.6*  PLT 196 219 228 278 947   Basic Metabolic Panel: Recent Labs  Lab 06/17/18 0242 06/18/18 0426 06/19/18 0404 06/20/18 0410 06/21/18 0250  NA 137 139 138 137 137  K 3.9 4.5 3.9 3.5 4.3  CL 106 108 108 107 105  CO2 22 23 21* 22 23  GLUCOSE 204* 106* 102* 110* 100*  BUN 12 12 11 11 11   CREATININE 0.68 0.65 0.60* 0.68 0.70  CALCIUM 7.2* 7.7* 7.9* 8.1* 8.1*   GFR: Estimated Creatinine Clearance: 85.9 mL/min (by C-G formula based on SCr of 0.7 mg/dL). Liver Function Tests: Recent Labs  Lab 06/15/18 1355 06/16/18 0336 06/18/18 0426 06/20/18 0410 06/21/18 0250  AST 48* 70* 40 26 50*  ALT 108* 106* 59* 45* 46*  ALKPHOS 124 90 97 131* 184*  BILITOT 1.6* 1.3* 0.7 0.7 2.5*  PROT 6.5 5.3* 5.1* 5.4* 5.4*  ALBUMIN 3.0* 2.1* 2.0* 2.3* 2.4*   Recent Labs  Lab 06/15/18 1355  LIPASE 29   No results for input(s): AMMONIA in the last 168 hours. Coagulation Profile: No results for input(s): INR, PROTIME in  the last 168 hours. Cardiac Enzymes: No results for input(s): CKTOTAL, CKMB, CKMBINDEX, TROPONINI in the last 168 hours. BNP (last 3 results) No results for input(s): PROBNP in the last 8760 hours. HbA1C: No results for input(s): HGBA1C in the last 72 hours. CBG: No results for input(s): GLUCAP in the last 168 hours. Lipid Profile: No results for input(s): CHOL, HDL, LDLCALC, TRIG, CHOLHDL, LDLDIRECT in the last 72 hours. Thyroid Function Tests: No results for input(s): TSH, T4TOTAL, FREET4, T3FREE, THYROIDAB in the last 72 hours. Anemia Panel: No results for input(s): VITAMINB12, FOLATE, FERRITIN, TIBC, IRON, RETICCTPCT in the last 72 hours. Urine analysis:    Component Value Date/Time   COLORURINE YELLOW 02/17/2018 1404   APPEARANCEUR CLEAR 02/17/2018 1404   LABSPEC 1.020 02/17/2018 1404   PHURINE 5.0 02/17/2018 1404   GLUCOSEU NEGATIVE 02/17/2018 1404   HGBUR NEGATIVE 02/17/2018 1404   BILIRUBINUR NEGATIVE 02/17/2018 1404   KETONESUR NEGATIVE 02/17/2018 1404   PROTEINUR NEGATIVE 02/17/2018 1404   UROBILINOGEN 0.2 01/22/2007 0720   NITRITE NEGATIVE 02/17/2018 1404   LEUKOCYTESUR NEGATIVE 02/17/2018 1404   Sepsis Labs: @LABRCNTIP (procalcitonin:4,lacticidven:4)  ) Recent Results (from the past 240 hour(s))  Aerobic/Anaerobic Culture (surgical/deep wound)  Status: None   Collection Time: 06/15/18  8:17 PM  Result Value Ref Range Status   Specimen Description   Final    ABSCESS Performed at [facility] [address] [place], [place] [address]    Special Requests   Final    NONE Performed at [facility], [place] [address] [place], [place] [address]    Gram Stain   Final    MODERATE WBC PRESENT, PREDOMINANTLY PMN FEW GRAM POSITIVE COCCI FEW GRAM POSITIVE RODS Performed at [facility], [place] [address], [place], [place] [address]    Culture   Final    FEW ESCHERICHIA COLI MIXED ANAEROBIC FLORA PRESENT.  CALL LAB IF  FURTHER IID REQUIRED.    Report Status 06/18/2018 FINAL  Final   Organism ID, Bacteria ESCHERICHIA COLI  Final      Susceptibility   Escherichia coli - MIC*    AMPICILLIN >=32 RESISTANT Resistant     CEFAZOLIN <=4 SENSITIVE Sensitive     CEFEPIME <=1 SENSITIVE Sensitive     CEFTAZIDIME <=1 SENSITIVE Sensitive     CEFTRIAXONE <=1 SENSITIVE Sensitive     CIPROFLOXACIN <=0.25 SENSITIVE Sensitive     GENTAMICIN >=16 RESISTANT Resistant     IMIPENEM <=0.25 SENSITIVE Sensitive     TRIMETH/SULFA >=320 RESISTANT Resistant     AMPICILLIN/SULBACTAM >=32 RESISTANT Resistant     PIP/TAZO <=4 SENSITIVE Sensitive     Extended ESBL NEGATIVE Sensitive     * FEW ESCHERICHIA COLI  MRSA PCR Screening     Status: None   Collection Time: 06/15/18 10:22 PM  Result Value Ref Range Status   MRSA by PCR NEGATIVE NEGATIVE Final    Comment:        The GeneXpert MRSA Assay (FDA approved for NASAL specimens only), is one component of a comprehensive MRSA colonization surveillance program. It is not intended to diagnose MRSA infection nor to guide or monitor treatment for MRSA infections. Performed at [facility], [place] [address] [place],  [address]       Studies: Dg Abd Portable 2v  Result Date: 06/21/2018 CLINICAL DATA:  Lower abdominal pain and cramping today, had sigmoid colon resection on 06/15/2018 EXAM: PORTABLE ABDOMEN - 2 VIEW COMPARISON:  CT abdomen and pelvis 06/15/2018 FINDINGS: Air-filled dilated loops of small bowel throughout the mid abdomen likely postoperative ileus. Scattered gas and stool in colon. No definite bowel wall thickening or free air. Prior lumbar fusion L2-L5. Bones demineralized. No urine tract calcification. IMPRESSION: Diffuse gaseous distention of small bowel loops question postoperative ileus. Electronically Signed   By: Lavonia Dana M.D.   On: 06/21/2018 15:51    Scheduled Meds: . albuterol  2.5 mg Nebulization TID  . lip balm  1  application Topical BID  . methylPREDNISolone (SOLU-MEDROL) injection  20 mg Intravenous Daily  . metoprolol tartrate  10 mg Intravenous Q6H  . mometasone-formoterol  2 puff Inhalation BID  . pantoprazole (PROTONIX) IV  40 mg Intravenous QHS    Continuous Infusions: . sodium chloride 50 mL/hr at 06/21/18 0640  . heparin Stopped (06/21/18 0850)  . methocarbamol (ROBAXIN) IV 1,000 mg (06/21/18 4709)  . ondansetron (ZOFRAN) IV    . piperacillin-tazobactam (ZOSYN)  IV 3.375 g (06/21/18 1405)     LOS: 6 days     Kayleen Memos, MD [facility] Pager [phone]  If 7PM-7AM, please contact night-coverage [email or web address] Password [facility] 06/21/2018, 5:34 PM

## 2018-06-21 NOTE — Progress Notes (Signed)
[place] for Heparin Indication: atrial fibrillation (PTA Eliquis on hold)  Allergies  Allergen Reactions  . Lidocaine Hcl Anaphylaxis and Other (See Comments)    Xylocaine  . Xylocaine [Lidocaine] Anaphylaxis  . Codeine Nausea And Vomiting  . Crestor [Rosuvastatin Calcium] Other (See Comments)    All-over body aches Myalgias   . Statins Other (See Comments)    Muscle soreness and an aching feeling all over Myalgias  . Percocet [Oxycodone-Acetaminophen] Itching    Patient Measurements: Height: 5\' 9"  (175.3 cm) Weight: 182 lb 15.7 oz (83 kg) IBW/kg (Calculated) : 70.7 Heparin Dosing Weight: TBW  Vital Signs: Temp: 98.3 F (36.8 C) (02/08 0651) Temp Source: Oral (02/07 2141) BP: 142/73 (02/08 0651) Pulse Rate: 74 (02/08 0651)  Labs: Recent Labs    06/19/18 0404 06/19/18 7858 06/20/18 0410 06/21/18 0250  HGB 12.4*  --  12.9* 13.0  HCT 37.5*  --  38.6* 38.8*  PLT 228  --  278 291  HEPARINUNFRC  --  0.29* 0.31 0.42  CREATININE 0.60*  --  0.68 0.70    Estimated Creatinine Clearance: 85.9 mL/min (by C-G formula based on SCr of 0.7 mg/dL).  Assessment: 58 yoM admitted on 2/2 for sigmoid colon perforation s/p Hartman colectomy/colostomy 06/15/2018 with ileus.  PMH significant for Afib on Eliquis prior to admission;  Eliquis was held and [facility] given on 2/2 prior to emergent surgery.  Pharmacy is now consulted to dose Heparin IV while NPO and unable to take Eliquis.  CCS ok with starting Heparin today as well.  PTA Eliquis 5mg  BID. Last dose on 2/2 at 0800.  Today, 06/21/2018:  Heparin level in therapeutic range (0.42) on 1300 units/hr  CBC: Hgb 13.0, Plt wnl (stable)  No bleeding or complications reported.  SCr 0.7, stable  Noted to not be tolerating clear liquids  Goal of Therapy:  Heparin level 0.3-0.7 units/ml Monitor platelets by anticoagulation protocol: Yes   Plan:   Continue heparin infusion at 1300  units/hr  Daily CBC and heparin level  F/u ability to resume Eliquis  Peggyann Juba, PharmD, BCPS Pager: [phone] 06/21/2018 8:48 AM

## 2018-06-21 NOTE — Progress Notes (Signed)
Patient ID: JANIEL CRISOSTOMO, male   DOB: 07-19-1945, 73 y.o.   MRN: [id number] 6 Days Post-Op   Subjective: Some bleeding from midline wound this AM. NG clamped for 2 days. Some crampy pain with po liquids but denies nausea  Objective: Vital signs in last 24 hours: Temp:  [97.9 F (36.6 C)-99 F (37.2 C)] 97.9 F (36.6 C) (02/08 0857) Pulse Rate:  [74-86] 77 (02/08 0857) Resp:  [16-17] 16 (02/08 0857) BP: (142-157)/(73-99) 150/89 (02/08 0857) SpO2:  [93 %-95 %] 95 % (02/08 0857) Last BM Date: 06/20/18(per ostomy )  Intake/Output from previous day: 02/07 0701 - 02/08 0700 In: 869.8 [I.V.:869.8] Out: 2775 [Urine:2750; Drains:25] Intake/Output this shift: Total I/O In: -  Out: 250 [Urine:250]  General appearance: alert, cooperative and no distress GI: normal findings: soft, non-tender and minimal if any tenderness. A lot of gas and some stool in ostomy bag Incision/Wound: dark blood under VAC dressing.  Had some leaking but none currently  Lab Results:  Recent Labs    06/20/18 0410 06/21/18 0250  WBC 9.8 10.6*  HGB 12.9* 13.0  HCT 38.6* 38.8*  PLT 278 291   BMET Recent Labs    06/20/18 0410 06/21/18 0250  NA 137 137  K 3.5 4.3  CL 107 105  CO2 22 23  GLUCOSE 110* 100*  BUN 11 11  CREATININE 0.68 0.70  CALCIUM 8.1* 8.1*     Studies/Results: No results found.  Anti-infectives: Anti-infectives (From admission, onward)   Start     Dose/Rate Route Frequency Ordered Stop   06/15/18 2359  metroNIDAZOLE (FLAGYL) IVPB 500 mg  Status:  Discontinued     500 mg 100 mL/hr over 60 Minutes Intravenous Every 8 hours 06/15/18 1655 06/16/18 0934   06/15/18 2300  piperacillin-tazobactam (ZOSYN) IVPB 3.375 g  Status:  Discontinued     3.375 g 12.5 mL/hr over 240 Minutes Intravenous Every 8 hours 06/15/18 2256 06/15/18 2310   06/15/18 2200  piperacillin-tazobactam (ZOSYN) IVPB 3.375 g     3.375 g 12.5 mL/hr over 240 Minutes Intravenous Every 8 hours 06/15/18 1743     06/15/18 1445  ceFEPIme (MAXIPIME) 2 g in sodium chloride 0.9 % 100 mL IVPB     2 g 200 mL/hr over 30 Minutes Intravenous  Once 06/15/18 1431 06/15/18 1606   06/15/18 1445  metroNIDAZOLE (FLAGYL) IVPB 500 mg     500 mg 100 mL/hr over 60 Minutes Intravenous  Once 06/15/18 1431 06/15/18 1711      Assessment/Plan: Perforated diverticulitis  s/p Procedure(s): EXPLORATORY LAPAROTOMY SIGMOID COLON RESECTION COLOSTOMY Ileus appears to be resolving-D/C NGT and restart CL diet as tol Incisional bleeding.  Has been on IV heparin now held.  No active bleeding at present, would leave VAC in place for now, hold heparin today Continue Zosyn   LOS: 6 days    Edward Jolly 06/21/2018

## 2018-06-22 ENCOUNTER — Inpatient Hospital Stay (HOSPITAL_COMMUNITY): Payer: PPO

## 2018-06-22 LAB — COMPREHENSIVE METABOLIC PANEL
ALT: 38 U/L (ref 0–44)
AST: 16 U/L (ref 15–41)
Albumin: 2.3 g/dL — ABNORMAL LOW (ref 3.5–5.0)
Alkaline Phosphatase: 156 U/L — ABNORMAL HIGH (ref 38–126)
Anion gap: 9 (ref 5–15)
BUN: 10 mg/dL (ref 8–23)
CO2: 24 mmol/L (ref 22–32)
Calcium: 8.5 mg/dL — ABNORMAL LOW (ref 8.9–10.3)
Chloride: 105 mmol/L (ref 98–111)
Creatinine, Ser: 0.64 mg/dL (ref 0.61–1.24)
GFR calc Af Amer: >60 mL/min (ref >60)
GFR calc non Af Amer: >60 mL/min (ref >60)
Glucose, Bld: 119 mg/dL — ABNORMAL HIGH (ref 70–99)
Potassium: 3.5 mmol/L (ref 3.5–5.1)
Sodium: 138 mmol/L (ref 135–145)
Total Bilirubin: 0.9 mg/dL (ref 0.3–1.2)
Total Protein: 5.2 g/dL — ABNORMAL LOW (ref 6.5–8.1)

## 2018-06-22 LAB — CBC
HCT: 36.5 % — ABNORMAL LOW (ref 39.0–52.0)
Hemoglobin: 12.3 g/dL — ABNORMAL LOW (ref 13.0–17.0)
MCH: 36.4 pg — ABNORMAL HIGH (ref 26.0–34.0)
MCHC: 33.7 g/dL (ref 30.0–36.0)
MCV: 108 fL — ABNORMAL HIGH (ref 80.0–100.0)
Platelets: 298 10*3/uL (ref 150–400)
RBC: 3.38 MIL/uL — ABNORMAL LOW (ref 4.22–5.81)
RDW: 12.9 % (ref 11.5–15.5)
WBC: 7.8 10*3/uL (ref 4.0–10.5)
nRBC: 0 % (ref 0.0–0.2)

## 2018-06-22 MED ORDER — POTASSIUM CHLORIDE 10 MEQ/100ML IV SOLN: 10.0000 meq | INTRAVENOUS | Status: AC

## 2018-06-22 MED ORDER — POTASSIUM CHLORIDE 10 MEQ/100ML IV SOLN: 10.0000 meq | INTRAVENOUS | Status: DC

## 2018-06-22 MED ORDER — POTASSIUM CHLORIDE 20 MEQ PO PACK
20.0000 meq | PACK | Freq: Two times a day (BID) | ORAL | Status: DC
  Administered 2018-06-22 – 2018-06-24 (×5): 20 meq via ORAL
  Filled 2018-06-22 (×5): qty 1

## 2018-06-22 MED ORDER — MAGNESIUM SULFATE 2 GM/50ML IV SOLN
2.0000 g | Freq: Once | INTRAVENOUS | Status: AC
  Administered 2018-06-22: 2 g via INTRAVENOUS
  Filled 2018-06-22: qty 50

## 2018-06-22 NOTE — Progress Notes (Signed)
[facility] Office:  [phone] General Surgery Progress Note   LOS: 7 days  POD -  7 Days Post-Op  Chief Complaint: Abdominal pain  Assessment and Plan: 1.  EXPLORATORY LAPAROTOMY, SIGMOID COLON RESECTION, COLOSTOMY - 06/15/2018 [name]  For perforated diverticulitis   Zosyn  WBC - 7,800 - 06/22/2018  Looks good.  I advanced him to regular diet.  His oral meds can be restarted.  2.  Incisional bleeding  I took VAC off and will change him to BID dressing changes.   Will hold Eliquis at least one more day. 3.  Has pulmonary fibrosis  On solumedrol 4.  History of A Fib 5.  OSA 6.  HTN   Principal Problem:   Sigmoid colon perforation s/p Hartmann colectomy/colostomy 06/15/2018 Active Problems:   Abnormal LFTs   Essential hypertension   ILD (interstitial lung disease) (HCC)   PAF (paroxysmal atrial fibrillation) (HCC)   OSA (obstructive sleep apnea)  Subjective:  Doing well.  His colostomy is working.  His wife, [name], is at the bedside.  She is in nursing (FP)  Objective:   Vitals:   06/22/18 0412 06/22/18 1053  BP: 137/82   Pulse: 67   Resp: 16   Temp: 98.3 F (36.8 C)   SpO2: 96% 97%     Intake/Output from previous day:  02/08 0701 - 02/09 0700 In: 1080.6 [P.O.:480; I.V.:350; IV Piggyback:250.6] Out: 850 [Urine:850]  Intake/Output this shift:  Total I/O In: -  Out: 400 [Urine:400]   Physical Exam:   General: WN older WM who is alert and oriented.    HEENT: Normal. Pupils equal. .   Lungs: Clear   Abdomen: Soft.  Has BS.   Wound: Colostomy left abdomen.  Wound with clot.  I took off VAC. There is no bleeding.  Will start BID dressing changes.   Lab Results:    Recent Labs    06/21/18 0250 06/22/18 0313  WBC 10.6* 7.8  HGB 13.0 12.3*  HCT 38.8* 36.5*  PLT 291 298    BMET   Recent Labs    06/21/18 0250 06/22/18 0313  NA 137 138  K 4.3 3.5  CL 105 105  CO2 23 24  GLUCOSE 100* 119*  BUN 11 10  CREATININE 0.70 0.64  CALCIUM  8.1* 8.5*    PT/INR  No results for input(s): LABPROT, INR in the last 72 hours.  ABG  No results for input(s): PHART, HCO3 in the last 72 hours.  Invalid input(s): PCO2, PO2   Studies/Results:  Dg Abd Portable 2v  Result Date: 06/21/2018 CLINICAL DATA:  Lower abdominal pain and cramping today, had sigmoid colon resection on 06/15/2018 EXAM: PORTABLE ABDOMEN - 2 VIEW COMPARISON:  CT abdomen and pelvis 06/15/2018 FINDINGS: Air-filled dilated loops of small bowel throughout the mid abdomen likely postoperative ileus. Scattered gas and stool in colon. No definite bowel wall thickening or free air. Prior lumbar fusion L2-L5. Bones demineralized. No urine tract calcification. IMPRESSION: Diffuse gaseous distention of small bowel loops question postoperative ileus. Electronically Signed   By: Lavonia Dana M.D.   On: 06/21/2018 15:51     Anti-infectives:   Anti-infectives (From admission, onward)   Start     Dose/Rate Route Frequency Ordered Stop   06/15/18 2359  metroNIDAZOLE (FLAGYL) IVPB 500 mg  Status:  Discontinued     500 mg 100 mL/hr over 60 Minutes Intravenous Every 8 hours 06/15/18 1655 06/16/18 0934   06/15/18 2300  piperacillin-tazobactam (ZOSYN) IVPB 3.375  g  Status:  Discontinued     3.375 g 12.5 mL/hr over 240 Minutes Intravenous Every 8 hours 06/15/18 2256 06/15/18 2310   06/15/18 2200  piperacillin-tazobactam (ZOSYN) IVPB 3.375 g     3.375 g 12.5 mL/hr over 240 Minutes Intravenous Every 8 hours 06/15/18 1743     06/15/18 1445  ceFEPIme (MAXIPIME) 2 g in sodium chloride 0.9 % 100 mL IVPB     2 g 200 mL/hr over 30 Minutes Intravenous  Once 06/15/18 1431 06/15/18 1606   06/15/18 1445  metroNIDAZOLE (FLAGYL) IVPB 500 mg     500 mg 100 mL/hr over 60 Minutes Intravenous  Once 06/15/18 1431 06/15/18 1711      Alphonsa Overall, MD, FACS Pager: [facility] Office: [phone] 06/22/2018

## 2018-06-22 NOTE — Progress Notes (Signed)
PROGRESS NOTE  ASIM GERSTEN CWC:[id number] DOB: [birth year] DOA: 06/15/2018 PCP: [name], MD  HPI/Recap of past 24 hours: Patient is 73 year old male with a history of interstitial lung disease on prednisone, CellCept, and nintedanib,A. 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  No nausea vomiting fevers.  He does have chronic shortness of breath due to ILD. CT abdomen pelvis showed proximal sigmoid diverticulitis with large amount of free air in the adjacent peritoneal fat and extending in the epigastric region consistent with perforation.  5.2x 4.3 cm fluid collection with air-fluid levels in the area concerning for contained perforation or developing abscess.  Mild small bowel dilatation may represent ileus or possibly obstruction.  Hospital course complicated by bleeding around the wound VAC.  Heparin held on 06/21/2018.  Wound VAC removed on 06/22/2018.  06/22/2018: Seen and examined with his wife at bedside.  No acute events overnight.  Denies any abdominal pain.  NG tube has been removed and states he is feeling better.  Wound VAC removed by general surgery and will change him to twice daily dressing changes.  Will hold Eliquis at least 1 more day per general surgery.    Assessment/Plan: Principal Problem:   Sigmoid colon perforation s/p Hartmann colectomy/colostomy 06/15/2018 Active Problems:   Abnormal LFTs   Essential hypertension   ILD (interstitial lung disease) (HCC)   PAF (paroxysmal atrial fibrillation) (HCC)   OSA (obstructive sleep apnea)  Acute sigmoid diverticulitis with perforation s/p Hartmann colectomy/colostomy 06/15/2018 with ileus -Underwent exploratory laparotomy with sigmoid colon resection and end colostomy -Intra-Op culture report E. coli which is sensitive to Zosyn -Continue Zosyn for a minimum of 5 days as recommended by  general surgery -Postop day #7, management per general surgery -Mobilize per surgery -Afebrile, no leukocytosis -Hemoglobin is stable -Wound VAC removed on 06/22/2018 by general surgery.  Dressing changes twice daily. -Diet per general surgery, currently on clear liquid Leukocytosis is resolving WBC 7.0 from 10.6 Repeat CBC in the morning  Acute blood loss from wound VAC on 06/21/2018 Hold heparin drip and Eliquis another day per general surgery Hemoglobin stable 12.3 from 13.0 Repeat H&H in the morning  Small bowel distention Seen on x-ray Management per surgery  Active Problems:   Abnormal LFTs -In the setting of acute diverticulitis, perforation -Acute hepatitis panel negative, follow LFTs -ALT trending down -LFTs are resolving  Uncontrolled hypertension Pain may be contributing to uncontrolled hypertension Increase IV metoprolol to 10 mg every 6 hours  Continue to closely monitor vital signs on telemetry    ILD (interstitial lung disease) (HCC) -Continue to hold CellCept, nintedanib, has been on prednisone for many years -Continue low-dose Solu-Medrol to avoid adrenal insufficiency  -Continue Symbicort, nebulizer treatments    PAF (paroxysmal atrial fibrillation) (HCC) -Continue IV Lopressor, continue to hold Eliquis - Hold heparin drip today 06/21/2018  Code Status: Full CODE STATUS DVT Prophylaxis: Heparin drip Family Communication:  Wife at bedside.  All questions answered to her satisfaction. Disposition Plan: Home versus SNF in 2-3 days when hemodynamically stable or when general surgery signs off.    Procedures:  Expiratory laparotomy with sigmoid colon resection and colostomy  Consultants:   General surgery    Objective: Vitals:   06/22/18 0107 06/22/18 0412 06/22/18 1053 06/22/18 1329  BP: (!) 150/93 137/82  135/86  Pulse: 75 67  78  Resp:  16  20  Temp:  98.3 F (36.8 C)  98.2 F (36.8 C)  TempSrc:  Oral  Oral  SpO2:  96% 97% 95%    Weight:      Height:        Intake/Output Summary (Last 24 hours) at 06/22/2018 1345 Last data filed at 06/22/2018 4098 Gross per 24 hour  Intake 860 ml  Output 1000 ml  Net -140 ml   Filed Weights   06/15/18 1758  Weight: 83 kg    Exam:  . General: 73 y.o. year-old male well-developed well-nourished no acute distress.  Alert and oriented x3. . Cardiovascular: Regular rate and rhythm with no rubs or gallops.  No JVD or thyromegaly . Respiratory: Clear to auscultation with no wheezes or rales.  Good inspiratory effort . Abdomen: Soft nontender.  Wound VAC in place this morning.  Hypoactive bowel sounds. Colostomy bag in place with brown stool output. . Musculoskeletal: No cyanosis or clubbing.  No pedal edema bilaterally. Marland Kitchen Psychiatry: Mood is appropriate for condition and setting   Data Reviewed: CBC: Recent Labs  Lab 06/18/18 0426 06/19/18 0404 06/20/18 0410 06/21/18 0250 06/22/18 0313  WBC 12.9* 9.1 9.8 10.6* 7.8  HGB 11.7* 12.4* 12.9* 13.0 12.3*  HCT 36.1* 37.5* 38.6* 38.8* 36.5*  MCV 109.7* 107.4* 106.0* 109.6* 108.0*  PLT 219 228 278 291 119   Basic Metabolic Panel: Recent Labs  Lab 06/18/18 0426 06/19/18 0404 06/20/18 0410 06/21/18 0250 06/22/18 0313  NA 139 138 137 137 138  K 4.5 3.9 3.5 4.3 3.5  CL 108 108 107 105 105  CO2 23 21* 22 23 24   GLUCOSE 106* 102* 110* 100* 119*  BUN 12 11 11 11 10   CREATININE 0.65 0.60* 0.68 0.70 0.64  CALCIUM 7.7* 7.9* 8.1* 8.1* 8.5*   GFR: Estimated Creatinine Clearance: 85.9 mL/min (by C-G formula based on SCr of 0.64 mg/dL). Liver Function Tests: Recent Labs  Lab 06/16/18 0336 06/18/18 0426 06/20/18 0410 06/21/18 0250 06/22/18 0313  AST 70* 40 26 50* 16  ALT 106* 59* 45* 46* 38  ALKPHOS 90 97 131* 184* 156*  BILITOT 1.3* 0.7 0.7 2.5* 0.9  PROT 5.3* 5.1* 5.4* 5.4* 5.2*  ALBUMIN 2.1* 2.0* 2.3* 2.4* 2.3*   Recent Labs  Lab 06/15/18 1355  LIPASE 29   No results for input(s): AMMONIA in the last 168  hours. Coagulation Profile: No results for input(s): INR, PROTIME in the last 168 hours. Cardiac Enzymes: No results for input(s): CKTOTAL, CKMB, CKMBINDEX, TROPONINI in the last 168 hours. BNP (last 3 results) No results for input(s): PROBNP in the last 8760 hours. HbA1C: No results for input(s): HGBA1C in the last 72 hours. CBG: No results for input(s): GLUCAP in the last 168 hours. Lipid Profile: No results for input(s): CHOL, HDL, LDLCALC, TRIG, CHOLHDL, LDLDIRECT in the last 72 hours. Thyroid Function Tests: No results for input(s): TSH, T4TOTAL, FREET4, T3FREE, THYROIDAB in the last 72 hours. Anemia Panel: No results for input(s): VITAMINB12, FOLATE, FERRITIN, TIBC, IRON, RETICCTPCT in the last 72 hours. Urine analysis:    Component Value Date/Time   COLORURINE YELLOW 02/17/2018 1404   APPEARANCEUR CLEAR 02/17/2018 1404   LABSPEC 1.020 02/17/2018 1404   PHURINE 5.0 02/17/2018 1404   GLUCOSEU NEGATIVE 02/17/2018 1404   HGBUR NEGATIVE 02/17/2018 1404   BILIRUBINUR NEGATIVE 02/17/2018 1404   KETONESUR NEGATIVE 02/17/2018 1404   PROTEINUR NEGATIVE 02/17/2018 1404   UROBILINOGEN 0.2 01/22/2007 0720   NITRITE NEGATIVE 02/17/2018 1404   LEUKOCYTESUR NEGATIVE 02/17/2018 1404  Sepsis Labs: @LABRCNTIP (procalcitonin:4,lacticidven:4)  ) Recent Results (from the past 240 hour(s))  Aerobic/Anaerobic Culture (surgical/deep wound)     Status: None   Collection Time: 06/15/18  8:17 PM  Result Value Ref Range Status   Specimen Description   Final    ABSCESS Performed at [facility] [address], [place], [place] [address]    Special Requests   Final    NONE Performed at [facility], [place] [address], [place], [place] [address]    Gram Stain   Final    MODERATE WBC PRESENT, PREDOMINANTLY PMN FEW GRAM POSITIVE COCCI FEW GRAM POSITIVE RODS Performed at [facility], [place] [address], [place], [place] [address]    Culture   Final     FEW ESCHERICHIA COLI MIXED ANAEROBIC FLORA PRESENT.  CALL LAB IF FURTHER IID REQUIRED.    Report Status 06/18/2018 FINAL  Final   Organism ID, Bacteria ESCHERICHIA COLI  Final      Susceptibility   Escherichia coli - MIC*    AMPICILLIN >=32 RESISTANT Resistant     CEFAZOLIN <=4 SENSITIVE Sensitive     CEFEPIME <=1 SENSITIVE Sensitive     CEFTAZIDIME <=1 SENSITIVE Sensitive     CEFTRIAXONE <=1 SENSITIVE Sensitive     CIPROFLOXACIN <=0.25 SENSITIVE Sensitive     GENTAMICIN >=16 RESISTANT Resistant     IMIPENEM <=0.25 SENSITIVE Sensitive     TRIMETH/SULFA >=320 RESISTANT Resistant     AMPICILLIN/SULBACTAM >=32 RESISTANT Resistant     PIP/TAZO <=4 SENSITIVE Sensitive     Extended ESBL NEGATIVE Sensitive     * FEW ESCHERICHIA COLI  MRSA PCR Screening     Status: None   Collection Time: 06/15/18 10:22 PM  Result Value Ref Range Status   MRSA by PCR NEGATIVE NEGATIVE Final    Comment:        The GeneXpert MRSA Assay (FDA approved for NASAL specimens only), is one component of a comprehensive MRSA colonization surveillance program. It is not intended to diagnose MRSA infection nor to guide or monitor treatment for MRSA infections. Performed at [facility], [place] [address], [place], [place] [address]       Studies: Dg Abd Portable 1v  Result Date: 06/22/2018 CLINICAL DATA:  Abdominal distention EXAM: PORTABLE ABDOMEN - 1 VIEW COMPARISON:  Yesterday FINDINGS: Continued small bowel distension by gas. Colonic gas is present without distention. No concerning mass effect or calcification. IMPRESSION: Continued small bowel distention. Clinical setting favors postoperative ileus, although there is preferential distension of small bowel and the previously seen perforation involved the small bowel mesentery, partial obstruction is also considered. Electronically Signed   By: Monte Fantasia M.D.   On: 06/22/2018 12:24    Scheduled Meds: . albuterol  2.5 mg Nebulization  TID  . lip balm  1 application Topical BID  . methylPREDNISolone (SOLU-MEDROL) injection  20 mg Intravenous Daily  . metoprolol tartrate  10 mg Intravenous Q6H  . mometasone-formoterol  2 puff Inhalation BID  . pantoprazole (PROTONIX) IV  40 mg Intravenous QHS  . potassium chloride  20 mEq Oral BID    Continuous Infusions: . methocarbamol (ROBAXIN) IV 1,000 mg (06/22/18 0616)  . ondansetron (ZOFRAN) IV    . piperacillin-tazobactam (ZOSYN)  IV 3.375 g (06/22/18 0618)     LOS: 7 days     Kayleen Memos, MD [facility] Pager [phone]  If 7PM-7AM, please contact night-coverage [email or web address] Password [email or web address] 06/22/2018, 1:45 PM

## 2018-06-23 LAB — PHOSPHORUS: Phosphorus: 3.3 mg/dL (ref 2.5–4.6)

## 2018-06-23 LAB — HEPARIN LEVEL (UNFRACTIONATED): Heparin Unfractionated: <0.1 IU/mL — ABNORMAL LOW (ref 0.30–0.70)

## 2018-06-23 LAB — CBC
HCT: 36.7 % — ABNORMAL LOW (ref 39.0–52.0)
Hemoglobin: 12.2 g/dL — ABNORMAL LOW (ref 13.0–17.0)
MCH: 35.4 pg — ABNORMAL HIGH (ref 26.0–34.0)
MCHC: 33.2 g/dL (ref 30.0–36.0)
MCV: 106.4 fL — ABNORMAL HIGH (ref 80.0–100.0)
Platelets: 347 10*3/uL (ref 150–400)
RBC: 3.45 MIL/uL — ABNORMAL LOW (ref 4.22–5.81)
RDW: 13 % (ref 11.5–15.5)
WBC: 7.9 10*3/uL (ref 4.0–10.5)
nRBC: 0 % (ref 0.0–0.2)

## 2018-06-23 LAB — BASIC METABOLIC PANEL
Anion gap: 7 (ref 5–15)
BUN: 11 mg/dL (ref 8–23)
CO2: 24 mmol/L (ref 22–32)
Calcium: 8.1 mg/dL — ABNORMAL LOW (ref 8.9–10.3)
Chloride: 107 mmol/L (ref 98–111)
Creatinine, Ser: 0.73 mg/dL (ref 0.61–1.24)
GFR calc Af Amer: >60 mL/min (ref >60)
GFR calc non Af Amer: >60 mL/min (ref >60)
Glucose, Bld: 148 mg/dL — ABNORMAL HIGH (ref 70–99)
Potassium: 3.9 mmol/L (ref 3.5–5.1)
Sodium: 138 mmol/L (ref 135–145)

## 2018-06-23 LAB — MAGNESIUM: Magnesium: 2.3 mg/dL (ref 1.7–2.4)

## 2018-06-23 MED ORDER — TAMSULOSIN HCL 0.4 MG PO CAPS
0.4000 mg | ORAL_CAPSULE | Freq: Two times a day (BID) | ORAL | Status: DC
  Administered 2018-06-23 – 2018-06-24 (×3): 0.4 mg via ORAL
  Filled 2018-06-23 (×3): qty 1

## 2018-06-23 MED ORDER — OXYCODONE HCL 5 MG PO TABS: 5.0000 mg | ORAL_TABLET | Freq: Four times a day (QID) | ORAL | Status: DC | PRN

## 2018-06-23 MED ORDER — GABAPENTIN 300 MG PO CAPS
300.0000 mg | ORAL_CAPSULE | Freq: Every day | ORAL | Status: DC
  Administered 2018-06-23: 300 mg via ORAL
  Filled 2018-06-23: qty 1

## 2018-06-23 MED ORDER — AMOXICILLIN-POT CLAVULANATE 875-125 MG PO TABS
1.0000 | ORAL_TABLET | Freq: Two times a day (BID) | ORAL | Status: DC
  Administered 2018-06-23 – 2018-06-24 (×3): 1 via ORAL
  Filled 2018-06-23 (×3): qty 1

## 2018-06-23 MED ORDER — METOPROLOL TARTRATE 25 MG PO TABS
25.0000 mg | ORAL_TABLET | Freq: Two times a day (BID) | ORAL | Status: DC
  Administered 2018-06-23 – 2018-06-24 (×3): 25 mg via ORAL
  Filled 2018-06-23 (×3): qty 1

## 2018-06-23 MED ORDER — PREDNISONE 10 MG PO TABS
10.0000 mg | ORAL_TABLET | Freq: Every day | ORAL | Status: DC
  Administered 2018-06-23 – 2018-06-24 (×2): 10 mg via ORAL
  Filled 2018-06-23 (×2): qty 1

## 2018-06-23 MED ORDER — AMOXICILLIN-POT CLAVULANATE 875-125 MG PO TABS: 1.0000 | ORAL_TABLET | Freq: Two times a day (BID) | ORAL | 0 refills | Status: DC

## 2018-06-23 MED ORDER — APIXABAN 5 MG PO TABS
5.0000 mg | ORAL_TABLET | Freq: Two times a day (BID) | ORAL | Status: DC
  Administered 2018-06-23 – 2018-06-24 (×2): 5 mg via ORAL
  Filled 2018-06-23 (×2): qty 1

## 2018-06-23 MED ORDER — OXYCODONE HCL 5 MG PO TABS: 5.0000 mg | ORAL_TABLET | Freq: Four times a day (QID) | ORAL | 0 refills | Status: DC | PRN

## 2018-06-23 MED ORDER — METHOCARBAMOL 500 MG PO TABS: 500.0000 mg | ORAL_TABLET | Freq: Three times a day (TID) | ORAL | 0 refills | Status: DC | PRN

## 2018-06-23 NOTE — Progress Notes (Signed)
Occupational Therapy Treatment Patient Details Name: [name] MRN: [id number] DOB: 1945/05/31 Today's Date: 06/23/2018    History of present illness 73 year old male with a history of interstitial lung disease, A. 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 Dx of bowel perforation, s/p colectomy/colostomy on 06/15/18.   OT comments  Goals met and pt Dced from OT  Follow Up Recommendations  Supervision/Assistance - 24 hour    Equipment Recommendations  None recommended by OT    Recommendations for Other Services      Precautions / Restrictions Precautions Precautions: Fall Precaution Comments: abdominal surgery       Mobility Bed Mobility Overal bed mobility: Independent                Transfers Overall transfer level: Independent                    Balance Overall balance assessment: Mild deficits observed, not formally tested                                         ADL either performed or assessed with clinical judgement   ADL Overall ADL's : Needs assistance/impaired     Grooming: Standing;Set up       Lower Body Bathing: Min guard;Sit to/from stand;Cueing for sequencing;Cueing for safety   Upper Body Dressing : Set up;Sitting   Lower Body Dressing: Min guard;Sit to/from stand;Cueing for sequencing;Cueing for safety Lower Body Dressing Details (indicate cue type and reason): PJ pants Toilet Transfer: Supervision/safety;Comfort height toilet;Ambulation   Toileting- Clothing Manipulation and Hygiene: Supervision/safety;Sit to/from stand         General ADL Comments: Pt overall S with ADL activity in which wife will provide.  OT goals met at this time- will sign off     Vision Baseline Vision/History: No visual deficits            Cognition Arousal/Alertness: Awake/alert Behavior During Therapy: WFL for tasks  assessed/performed Overall Cognitive Status: Within Functional Limits for tasks assessed                                                     Pertinent Vitals/ Pain       Faces Pain Scale: Hurts a little bit Pain Location: abd pain Pain Descriptors / Indicators: Sore Pain Intervention(s): Limited activity within patient's tolerance;Repositioned     Prior Functioning/Environment              Frequency  Min 2X/week        Progress Toward Goals  OT Goals(current goals can now be found in the care plan section)  Progress towards OT goals: Goals met and updated - see care plan     Plan All goals met and education completed, patient discharged from OT services       AM-PAC OT "6 Clicks" Daily Activity     Outcome Measure   Help from another person eating meals?: None Help from another person taking care of personal grooming?: None Help from another person toileting, which includes using toliet, bedpan, or urinal?: A Little Help from another person bathing (including washing, rinsing, drying)?: A Little Help from another  person to put on and taking off regular upper body clothing?: None Help from another person to put on and taking off regular lower body clothing?: A Lot 6 Click Score: 20    End of Session Equipment Utilized During Treatment: Rolling walker  OT Visit Diagnosis: Muscle weakness (generalized) (M62.81);Pain Pain - part of body: (abdomen)   Activity Tolerance Patient tolerated treatment well   Patient Left with call bell/phone within reach;with family/visitor present;in chair   Nurse Communication Mobility status        Time: 0208-9100 OT Time Calculation (min): 23 min  Charges: OT General Charges $OT Visit: 1 Visit OT Treatments $Self Care/Home Management : 8-22 mins  [name], [name] [name]      [name], [name] 06/23/2018, 12:54 PM

## 2018-06-23 NOTE — Progress Notes (Signed)
[facility] Progress Note  8 Days Post-Op  Subjective: CC: cramping Patient having some cramping after eating this AM, discussed smaller portions and walking may help. Having bowel function. Denies nausea. Dressing changed this AM. Wife present at bedside.   Objective: Vital signs in last 24 hours: Temp:  [98 F (36.7 C)-98.6 F (37 C)] 98.6 F (37 C) (02/10 0524) Pulse Rate:  [70-78] 70 (02/10 0524) Resp:  [16-20] 16 (02/10 0524) BP: (127-148)/(76-96) 138/96 (02/10 0524) SpO2:  [95 %-97 %] 96 % (02/10 0524) Last BM Date: 06/22/18(per ostomy)  Intake/Output from previous day: 02/09 0701 - 02/10 0700 In: 1124.6 [P.O.:480; IV Piggyback:644.6] Out: 1375 [Urine:1375] Intake/Output this shift: No intake/output data recorded.  PE: Gen:  Alert, NAD, pleasant Card:  Regular rate and rhythm Pulm:  Normal effort, clear to auscultation bilaterally Abd: Soft, minimally tender, non-distended, +BS, midline wound clean with good granulation and no further bleeding noted, stoma pink with soft stool present Skin: warm and dry, no rashes  Psych: A&Ox3   Lab Results:  Recent Labs    06/22/18 0313 06/23/18 0447  WBC 7.8 7.9  HGB 12.3* 12.2*  HCT 36.5* 36.7*  PLT 298 347   BMET Recent Labs    06/22/18 0313 06/23/18 0447  NA 138 138  K 3.5 3.9  CL 105 107  CO2 24 24  GLUCOSE 119* 148*  BUN 10 11  CREATININE 0.64 0.73  CALCIUM 8.5* 8.1*   PT/INR No results for input(s): LABPROT, INR in the last 72 hours. CMP     Component Value Date/Time   NA 138 06/23/2018 0447   K 3.9 06/23/2018 0447   CL 107 06/23/2018 0447   CO2 24 06/23/2018 0447   GLUCOSE 148 (H) 06/23/2018 0447   BUN 11 06/23/2018 0447   CREATININE 0.73 06/23/2018 0447   CREATININE 0.95 07/16/2016 0902   CALCIUM 8.1 (L) 06/23/2018 0447   PROT 5.2 (L) 06/22/2018 0313   ALBUMIN 2.3 (L) 06/22/2018 0313   AST 16 06/22/2018 0313   ALT 38 06/22/2018 0313   ALKPHOS 156 (H) 06/22/2018 0313   BILITOT  0.9 06/22/2018 0313   GFRNONAA >60 06/23/2018 0447   GFRAA >60 06/23/2018 0447   Lipase     Component Value Date/Time   LIPASE 29 06/15/2018 1355       Studies/Results: Dg Abd Portable 1v  Result Date: 06/22/2018 CLINICAL DATA:  Abdominal distention EXAM: PORTABLE ABDOMEN - 1 VIEW COMPARISON:  Yesterday FINDINGS: Continued small bowel distension by gas. Colonic gas is present without distention. No concerning mass effect or calcification. IMPRESSION: Continued small bowel distention. Clinical setting favors postoperative ileus, although there is preferential distension of small bowel and the previously seen perforation involved the small bowel mesentery, partial obstruction is also considered. Electronically Signed   By: Monte Fantasia M.D.   On: 06/22/2018 12:24   Dg Abd Portable 2v  Result Date: 06/21/2018 CLINICAL DATA:  Lower abdominal pain and cramping today, had sigmoid colon resection on 06/15/2018 EXAM: PORTABLE ABDOMEN - 2 VIEW COMPARISON:  CT abdomen and pelvis 06/15/2018 FINDINGS: Air-filled dilated loops of small bowel throughout the mid abdomen likely postoperative ileus. Scattered gas and stool in colon. No definite bowel wall thickening or free air. Prior lumbar fusion L2-L5. Bones demineralized. No urine tract calcification. IMPRESSION: Diffuse gaseous distention of small bowel loops question postoperative ileus. Electronically Signed   By: Lavonia Dana M.D.   On: 06/21/2018 15:51    Anti-infectives: Anti-infectives (From admission, onward)  Start     Dose/Rate Route Frequency Ordered Stop   06/15/18 2359  metroNIDAZOLE (FLAGYL) IVPB 500 mg  Status:  Discontinued     500 mg 100 mL/hr over 60 Minutes Intravenous Every 8 hours 06/15/18 1655 06/16/18 0934   06/15/18 2300  piperacillin-tazobactam (ZOSYN) IVPB 3.375 g  Status:  Discontinued     3.375 g 12.5 mL/hr over 240 Minutes Intravenous Every 8 hours 06/15/18 2256 06/15/18 2310   06/15/18 2200  piperacillin-tazobactam  (ZOSYN) IVPB 3.375 g     3.375 g 12.5 mL/hr over 240 Minutes Intravenous Every 8 hours 06/15/18 1743     06/15/18 1445  ceFEPIme (MAXIPIME) 2 g in sodium chloride 0.9 % 100 mL IVPB     2 g 200 mL/hr over 30 Minutes Intravenous  Once 06/15/18 1431 06/15/18 1606   06/15/18 1445  metroNIDAZOLE (FLAGYL) IVPB 500 mg     500 mg 100 mL/hr over 60 Minutes Intravenous  Once 06/15/18 1431 06/15/18 1711       Assessment/Plan Afib RVR - on cardizem and metoprolol, hold eliquis Interstitial lung disease - on chronic prednisone HTN Transaminitis/hyperbilirubinemia - hepatitis panelnegative, LFTs trending down  Perforated sigmoid colon S/pEXPLORATORY LAPAROTOMYWITHSIGMOID COLON RESECTIONAND END COLOSTOMY(HARTMAN)2/2 Dr. Marlou Starks - POD8 - surgical pathology: [place]. NO DYSPLASIA OR MALIGNANCY. - VAC stopped - continue BID wet to dry dressings - convert to PO abx today  ID - flagyl 2/2>>2/3; zosyn 2/2>>2/10; PO augmentin 2/10>> FEN -reg diet VTE -SCDs, ok to resume eliquis today  Foley -d/c on 2/4 Follow up -Dr. Marta Lamas placed [facility] orders and am working on arranging follow up. Convert to PO abx today. [place] for discharge from a surgical standpoint. Will send Rx for pain medication and 1 week of PO abx. Ok to resume eliquis.   LOS: 8 days    Brigid Re , [facility] 06/23/2018, 9:39 AM Pager: [phone] Consults: [phone]

## 2018-06-23 NOTE — Telephone Encounter (Signed)
Patient still currently admitted in hospital.

## 2018-06-23 NOTE — Progress Notes (Signed)
ANTICOAGULATION CONSULT NOTE - Consult  Pharmacy Consult for Eliquis Indication: atrial fibrillation   Allergies  Allergen Reactions  . Lidocaine Hcl Anaphylaxis and Other (See Comments)    Xylocaine  . Xylocaine [Lidocaine] Anaphylaxis  . Codeine Nausea And Vomiting  . Crestor [Rosuvastatin Calcium] Other (See Comments)    All-over body aches Myalgias   . Statins Other (See Comments)    Muscle soreness and an aching feeling all over Myalgias  . Percocet [Oxycodone-Acetaminophen] Itching    Patient Measurements: Height: 5\' 9"  (175.3 cm) Weight: 182 lb 15.7 oz (83 kg) IBW/kg (Calculated) : 70.7    Vital Signs: Temp: 98.2 F (36.8 C) (02/10 1349) Temp Source: Oral (02/10 1349) BP: 115/73 (02/10 1349) Pulse Rate: 69 (02/10 1349)  Labs: Recent Labs    06/21/18 0250 06/22/18 0313 06/23/18 0447  HGB 13.0 12.3* 12.2*  HCT 38.8* 36.5* 36.7*  PLT 291 298 347  HEPARINUNFRC 0.42  --  <0.10*  CREATININE 0.70 0.64 0.73    Estimated Creatinine Clearance: 85.9 mL/min (by C-G formula based on SCr of 0.73 mg/dL).  Assessment: 60 yoM admitted on 2/2 for sigmoid colon perforation s/p Hartman colectomy/colostomy 06/15/2018 with ileus.  PMH significant for Afib on Eliquis prior to admission;  Eliquis was held and [facility] given on 2/2 prior to emergent surgery.  On 2/4, Pharmacy was consulted to dose Heparin IV while NPO and unable to take Eliquis.    PTA Eliquis 5mg  BID. Last dose on 2/2 at 0800.  IV heparin was d/c'ed on 2/8 due to noted bleeding from wound vac.  Acute blood loss from wound vac has been being followed.  Surgery ok'ed resuming Eliquis today.  Today, 06/23/2018:  CBC: Hgb 12.2, Plt wnl (stable)  No bleeding or complications reported.  SCr 0.73, stable  Goal of Therapy:  Full anticoagulation  Monitor platelets by anticoagulation protocol: Yes   Plan:   Resume Eliquis 5mg  po BID  Leone Haven, PharmD 06/23/2018 5:46 PM

## 2018-06-23 NOTE — Consult Note (Addendum)
Ostomy follow-up:   Surgical team following for assessment and plan of care for abd wound.  Vac was discontinued this weekend, according to the progress notes.   Pt states the bedside nurse changed his ostomy pouch this am.  Offered to perform another teaching session and he stated, "I think I have it figured out," and declines need to have another pouch change demonstration.  He states he has practiced emptying in the bathroom.  Current pouch is intact with good seal, mod amt semi formed brown stool.  4 extra sets of barrier rings and pouches left at the bedside for probable discharge tomorrow. Reviewed ordering supplies; patient denies further questions regarding ostomy care. Julien Girt MSN, RN, [place], [place], [place]

## 2018-06-23 NOTE — Progress Notes (Signed)
Physical Therapy Discharge Patient Details Name: [name] MRN: [id number] DOB: 07-08-1945 Today's Date: 06/23/2018 Time:  -     Patient discharged from PT services secondary to goals met and no further PT needs identified.  Please see latest therapy progress note for current level of functioning and progress toward goals.    Progress and discharge plan discussed with patient and/or caregiver: Patient/Caregiver agrees with plan  Pt observed up ambulating in hallway with spouse (for second time this morning per pt).  Pt agreeable to no further PT needs and reports he has rollator at home if needed.  Since pt has met goals and currently mobilizing well with spouse, PT to sign off.     Sekai Nayak,KATHrine E 06/23/2018, 12:12 PM   Carmelia Bake, PT, DPT Acute Rehabilitation Services Office: [phone] Pager: [phone]

## 2018-06-23 NOTE — Discharge Instructions (Signed)
[facility], [place] [phone]  OPEN ABDOMINAL SURGERY: POST OP INSTRUCTIONS  Always review your discharge instruction sheet given to you by the facility where your surgery was performed.  IF YOU HAVE DISABILITY OR FAMILY LEAVE FORMS, YOU MUST BRING THEM TO THE OFFICE FOR PROCESSING.  PLEASE DO NOT GIVE THEM TO YOUR DOCTOR.  1. A prescription for pain medication may be given to you upon discharge.  Take your pain medication as prescribed, if needed.  If narcotic pain medicine is not needed, then you may take acetaminophen (Tylenol) or ibuprofen (Advil) as needed. 2. Take your usually prescribed medications unless otherwise directed. 3. If you need a refill on your pain medication, please contact your pharmacy. They will contact our office to request authorization.  Prescriptions will not be filled after 5pm or on week-ends. 4. You should follow a light diet the first few days after arrival home, such as soup and crackers, pudding, etc.unless your doctor has advised otherwise. A high-fiber, low fat diet can be resumed as tolerated.   Be sure to include lots of fluids daily. Most patients will experience some swelling and bruising on the chest and neck area.  Ice packs will help.  Swelling and bruising can take several days to resolve 5. Most patients will experience some swelling and bruising in the area of the incision. Ice pack will help. Swelling and bruising can take several days to resolve..  6. It is common to experience some constipation if taking pain medication after surgery.  Increasing fluid intake and taking a stool softener will usually help or prevent this problem from occurring.  A mild laxative (Milk of Magnesia or Miralax) should be taken according to package directions if there are no bowel movements after 48 hours. 7.  You may have steri-strips (small skin tapes) in place directly over the incision.  These strips should be left on the skin for 7-10 days.  If your  surgeon used skin glue on the incision, you may shower in 24 hours.  The glue will flake off over the next 2-3 weeks.  Any sutures or staples will be removed at the office during your follow-up visit. You may find that a light gauze bandage over your incision may keep your staples from being rubbed or pulled. You may shower and replace the bandage daily. 8. ACTIVITIES:  You may resume regular (light) daily activities beginning the next day--such as daily self-care, walking, climbing stairs--gradually increasing activities as tolerated.  You may have sexual intercourse when it is comfortable.  Refrain from any heavy lifting or straining until approved by your doctor. a. You may drive when you no longer are taking prescription pain medication, you can comfortably wear a seatbelt, and you can safely maneuver your car and apply brakes 9. You should see your doctor in the office for a follow-up appointment approximately two weeks after your surgery.  Make sure that you call for this appointment within a day or two after you arrive home to insure a convenient appointment time.  WHEN TO CALL YOUR DOCTOR: 1. Fever over 101.0 2. Inability to urinate 3. Nausea and/or vomiting 4. Extreme swelling or bruising 5. Continued bleeding from incision. 6. Increased pain, redness, or drainage from the incision. 7. Difficulty swallowing or breathing 8. Muscle cramping or spasms. 9. Numbness or tingling in hands or feet or around lips.  The clinic staff is available to answer your questions during regular business hours.  Please dont  hesitate to call and ask to speak to one of the nurses if you have concerns.  For further questions, please visit [email or web address]   Colostomy Home Guide, Adult  Colostomy surgery is done to create an opening in the front of the abdomen for stool (feces) to leave the body through an ostomy (stoma). Part of the large intestine is attached to the stoma. A bag, also called  a pouch, is fitted over the stoma. Stool and gas will collect in the bag. After surgery, you will need to empty and change your colostomy bag as needed. You will also need to care for your stoma. How to care for the stoma Your stoma should look pink, red, and moist, like the inside of your cheek. Soon after surgery, the stoma may be swollen, but this swelling will go away within 6 weeks. To care for the stoma:  Keep the skin around the stoma clean and dry.  Use a clean, soft washcloth to gently wash the stoma and the skin around it. Clean using a circular motion, and wipe away from the stoma opening, not toward it. ? Use warm water and only use cleansers recommended by your health care provider. ? Rinse the stoma area with plain water. ? Dry the area around the stoma well.  Use stoma powder or ointment on your skin only as told by your health care provider. Do not use any other powders, gels, wipes, or creams on the skin around the stoma.  Check the stoma area every day for signs of infection. Check for: ? New or worsening redness, swelling, or pain. ? New or increased fluid or blood. ? Pus or warmth.  Measure the stoma opening regularly and record the size. Watch for changes. (It is normal for the stoma to get smaller as swelling goes away.) Share this information with your health care provider. How to empty the colostomy bag  Empty your bag at bedtime and whenever it is one-third to one-half full. Do not let the bag get more than half-full with stool or gas. The bag could leak if it gets too full. Some colostomy bags have a built-in gas release valve that releases gas often throughout the day. Follow these basic steps: 1. Wash your hands with soap and water. 2. Sit far back on the toilet seat. 3. Put several pieces of toilet paper into the toilet water. This will prevent splashing as you empty stool into the toilet. 4. Remove the clip or the hook-and-loop fastener from the tail end of  the bag. 5. Unroll the tail, then empty the stool into the toilet. 6. Clean the tail with toilet paper or a moist towelette. 7. Reroll the tail, and close it with the clip or the hook-and-loop fastener. 8. Wash your hands again. How to change the colostomy bag Change your bag every 3-4 days or as often as told by your health care provider. Also change the bag if it is leaking or separating from the skin, or if your skin around the stoma looks or feels irritated. Irritated skin may be a sign that the bag is leaking. Always have colostomy supplies with you, and follow these basic steps: 1. Wash your hands with soap and water. Have paper towels or tissues nearby to clean any discharge. 2. Remove the old bag and skin barrier. Use your fingers or a warm cloth to gently push the skin away from the barrier. 3. Clean the stoma area with water or with mild soap and water,  as directed. Use water to rinse away any soap. 4. Dry the skin. You may use the cool setting on a hair dryer to do this. 5. Use a tracing pattern (template) to cut the skin barrier to the size needed. 6. If you are using a two-piece bag, attach the bag and the skin barrier to each other. Add the barrier ring, if you use one. 7. If directed, apply stoma powder or skin barrier gel to the skin. 8. Warm the skin barrier with your hands, or blow with a hair dryer for 5-10 seconds. 9. Remove the paper from the adhesive strip of the skin barrier. 10. Press the adhesive strip onto the skin around the stoma. 11. Gently rub the skin barrier onto the skin. This creates heat that helps the barrier to stick. 12. Apply stoma tape to the edges of the skin barrier, if desired. 37. Wash your hands again. General recommendations  Avoid wearing tight clothes or having anything press directly on your stoma or bag. Change your clothing whenever it is soiled or damp.  You may shower or bathe with the bag on or off. Do not use harsh or oily soaps or  lotions. Dry the skin and bag after bathing.  Store all supplies in a cool, dry place. Do not leave supplies in extreme heat because some parts can melt or not stick as well.  Whenever you leave home, take extra clothing and an extra skin barrier and bag with you.  If your bag gets wet, you can dry it with a hair dryer on the cool setting.  To prevent odor, you may put drops of ostomy deodorizer in the bag.  If recommended by your health care provider, put ostomy lubricant inside the bag. This helps stool to slide out of the bag more easily and completely. Contact a health care provider if:  You have new or worsening redness, swelling, or pain around your stoma.  You have new or increased fluid or blood coming from your stoma.  Your stoma feels warm to the touch.  You have pus coming from your stoma.  Your stoma extends in or out farther than normal.  You need to change your bag every day.  You have a fever. Get help right away if:  Your stool is bloody.  You have nausea or you vomit.  You have trouble breathing. Summary  Measure your stoma opening regularly and record the size. Watch for changes.  Empty your bag at bedtime and whenever it is one-third to one-half full. Do not let the bag get more than half-full with stool or gas.  Change your bag every 3-4 days or as often as told by your health care provider.  Whenever you leave home, take extra clothing and an extra skin barrier and bag with you. This information is not intended to replace advice given to you by your health care provider. Make sure you discuss any questions you have with your health care provider. Document Released: 05/03/2003 Document Revised: 01/03/2018 Document Reviewed: 10/24/2016 Elsevier Interactive Patient Education  [employer].

## 2018-06-23 NOTE — Care Management Note (Signed)
Case Management Note  Patient Details  Name: [name] MRN: [id number] Date of Birth: [birth year]  Subjective/Objective:                    Action/Plan: Pt plan to discharge home with wife who is a Therapist, sports and [place] for [employer]. Pt will need some supplies for ostomy and dressing at discharge please. Referral given to in house rep with [facility].    Expected Discharge Date:  (unknown)               Expected Discharge Plan:  [place]  In-House Referral:     Discharge planning Services  CM Consult  Post Acute Care Choice:    Choice offered to:  Patient, Spouse  DME Arranged:    DME Agency:     HH Arranged:  RN [place] Agency:  [place]  Status of Service:  Completed, signed off  If discussed at [place] of Stay Meetings, dates discussed:    Additional CommentsPurcell Mouton, RN 06/23/2018, 11:38 AM

## 2018-06-23 NOTE — Progress Notes (Signed)
PROGRESS NOTE  [name] GUR:[id number] DOB: 05-Apr-1946 DOA: 06/15/2018 PCP: [name], MD  HPI/Recap of past 24 hours: Patient is 73 year old male with a history of interstitial lung disease on prednisone, CellCept, and nintedanib,A. 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  No nausea vomiting fevers.  He does have chronic shortness of breath due to ILD. CT abdomen pelvis showed proximal sigmoid diverticulitis with large amount of free air in the adjacent peritoneal fat and extending in the epigastric region consistent with perforation.  5.2x 4.3 cm fluid collection with air-fluid levels in the area concerning for contained perforation or developing abscess.  Mild small bowel dilatation may represent ileus or possibly obstruction.  Hospital course complicated by bleeding around the wound VAC.  Heparin held on 06/21/2018.  Wound VAC removed on 06/22/2018.  06/22/2018: Seen and examined with his wife at bedside.  No acute events overnight.  Denies any abdominal pain.  NG tube has been removed and states he is feeling better.  Wound VAC removed by general surgery and will change him to twice daily dressing changes.  Will hold Eliquis at least 1 more day per general surgery.  06/23/18: He voices minimal abdominal pain on his current pain management.  Denies nausea or cramping on regular diet.  Pharmacy is assisting in converting his medication back to oral form.    Assessment/Plan: Principal Problem:   Sigmoid colon perforation s/p Hartmann colectomy/colostomy 06/15/2018 Active Problems:   Abnormal LFTs   Essential hypertension   ILD (interstitial lung disease) (HCC)   PAF (paroxysmal atrial fibrillation) (HCC)   OSA (obstructive sleep apnea)  Acute sigmoid diverticulitis with perforation s/p Hartmann colectomy/colostomy 06/15/2018 with ileus -Underwent  exploratory laparotomy with sigmoid colon resection and end colostomy -Intra-Op culture report E. coli which is sensitive to Zosyn -Completed 8 days of IV Zosyn  -Start Augmentin x7 days per general surgery recommendation -Postop day #8, management per general surgery -Wound VAC removed on 06/22/2018 by general surgery.  Dressing changes twice daily. -Tolerating a regular diet -Afebrile with no leukocytosis -Plan to discharge home tomorrow 06/24/2018 with home health services; will follow-up with general surgery outpatient.  General surgery will arrange follow-up.  Resolving acute blood loss from wound VAC on 06/21/2018 Hemoglobin is stable  Small bowel distention ?Postoperative ileus Seen on x-ray Management per surgery Continue to mobilize Exam benign this morning  Active Problems:   Abnormal LFTs -In the setting of acute diverticulitis, perforation -Acute hepatitis panel negative, follow LFTs -ALT trending down  Resolved uncontrolled hypertension Back on his home antihypertensive medications    ILD (interstitial lung disease) ([place]) -Continue to hold CellCept, nintedanib, has been on prednisone for many years -Resume prednisone -Continue Symbicort, nebulizer treatments    PAF (paroxysmal atrial fibrillation) (HCC) -Continue IV Lopressor -Okay to resume Eliquis per general surgery on 06/23/2018  Code Status: Full CODE STATUS DVT Prophylaxis: Heparin drip Family Communication:  Wife at bedside.  All questions answered to her satisfaction. Disposition Plan: Home tomorrow 06/24/2018 with home health services.    Procedures:  Expiratory laparotomy with sigmoid colon resection and colostomy  Consultants:   General surgery    Objective: Vitals:   06/23/18 0016 06/23/18 0524 06/23/18 1103 06/23/18 1349  BP: (!) 148/82 (!) 138/96  115/73  Pulse: 70 70  69  Resp:  16  14  Temp:  98.6 F (37 C)  98.2 F (36.8 C)  TempSrc:  Oral  Oral  SpO2:  96% 92% 97%    Weight:      Height:        Intake/Output Summary (Last 24 hours) at 06/23/2018 1725 Last data filed at 06/23/2018 1442 Gross per 24 hour  Intake 839.26 ml  Output 1425 ml  Net -585.74 ml   Filed Weights   06/15/18 1758  Weight: 83 kg    Exam:  . General: 73 y.o. year-old male well-developed well-nourished no acute distress.  Alert and oriented x3.   . Cardiovascular: Regular rate and rhythm with no rubs or gallops.  No JVD or thyromegaly . Respiratory: Clear to auscultation with no wheezes or rales.  Good inspiratory effort. . Abdomen: Soft nontender.  Surgical dressing in place.  Wound VAC has been removed.  Hypoactive bowel sounds. Colostomy bag in place with brown stool output. . Musculoskeletal: No cyanosis or clubbing.  No pedal edema bilaterally. Marland Kitchen Psychiatry: Mood is appropriate for condition and setting   Data Reviewed: CBC: Recent Labs  Lab 06/19/18 0404 06/20/18 0410 06/21/18 0250 06/22/18 0313 06/23/18 0447  WBC 9.1 9.8 10.6* 7.8 7.9  HGB 12.4* 12.9* 13.0 12.3* 12.2*  HCT 37.5* 38.6* 38.8* 36.5* 36.7*  MCV 107.4* 106.0* 109.6* 108.0* 106.4*  PLT 228 278 291 298 233   Basic Metabolic Panel: Recent Labs  Lab 06/19/18 0404 06/20/18 0410 06/21/18 0250 06/22/18 0313 06/23/18 0447  NA 138 137 137 138 138  K 3.9 3.5 4.3 3.5 3.9  CL 108 107 105 105 107  CO2 21* 22 23 24 24   GLUCOSE 102* 110* 100* 119* 148*  BUN 11 11 11 10 11   CREATININE 0.60* 0.68 0.70 0.64 0.73  CALCIUM 7.9* 8.1* 8.1* 8.5* 8.1*  MG  --   --   --   --  2.3  PHOS  --   --   --   --  3.3   GFR: Estimated Creatinine Clearance: 85.9 mL/min (by C-G formula based on SCr of 0.73 mg/dL). Liver Function Tests: Recent Labs  Lab 06/18/18 0426 06/20/18 0410 06/21/18 0250 06/22/18 0313  AST 40 26 50* 16  ALT 59* 45* 46* 38  ALKPHOS 97 131* 184* 156*  BILITOT 0.7 0.7 2.5* 0.9  PROT 5.1* 5.4* 5.4* 5.2*  ALBUMIN 2.0* 2.3* 2.4* 2.3*   No results for input(s): LIPASE, AMYLASE in the last  168 hours. No results for input(s): AMMONIA in the last 168 hours. Coagulation Profile: No results for input(s): INR, PROTIME in the last 168 hours. Cardiac Enzymes: No results for input(s): CKTOTAL, CKMB, CKMBINDEX, TROPONINI in the last 168 hours. BNP (last 3 results) No results for input(s): PROBNP in the last 8760 hours. HbA1C: No results for input(s): HGBA1C in the last 72 hours. CBG: No results for input(s): GLUCAP in the last 168 hours. Lipid Profile: No results for input(s): CHOL, HDL, LDLCALC, TRIG, CHOLHDL, LDLDIRECT in the last 72 hours. Thyroid Function Tests: No results for input(s): TSH, T4TOTAL, FREET4, T3FREE, THYROIDAB in the last 72 hours. Anemia Panel: No results for input(s): VITAMINB12, FOLATE, FERRITIN, TIBC, IRON, RETICCTPCT in the last 72 hours. Urine analysis:    Component Value Date/Time   COLORURINE YELLOW 02/17/2018 1404   APPEARANCEUR CLEAR 02/17/2018 1404   LABSPEC 1.020 02/17/2018 1404   PHURINE 5.0 02/17/2018 1404   GLUCOSEU NEGATIVE 02/17/2018 1404   HGBUR NEGATIVE 02/17/2018 1404   BILIRUBINUR NEGATIVE 02/17/2018 1404   KETONESUR NEGATIVE 02/17/2018 1404   PROTEINUR  NEGATIVE 02/17/2018 1404   UROBILINOGEN 0.2 01/22/2007 0720   NITRITE NEGATIVE 02/17/2018 1404   LEUKOCYTESUR NEGATIVE 02/17/2018 1404   Sepsis Labs: @LABRCNTIP (procalcitonin:4,lacticidven:4)  ) Recent Results (from the past 240 hour(s))  Aerobic/Anaerobic Culture (surgical/deep wound)     Status: None   Collection Time: 06/15/18  8:17 PM  Result Value Ref Range Status   Specimen Description   Final    ABSCESS Performed at [facility] [address] [place], [place] [address]    Special Requests   Final    NONE Performed at [facility], [place] [address], [place], [place] [address]    Gram Stain   Final    MODERATE WBC PRESENT, PREDOMINANTLY PMN FEW GRAM POSITIVE COCCI FEW GRAM POSITIVE RODS Performed at [facility],  [place] [address] [place], [place] [address]    Culture   Final    FEW ESCHERICHIA COLI MIXED ANAEROBIC FLORA PRESENT.  CALL LAB IF FURTHER IID REQUIRED.    Report Status 06/18/2018 FINAL  Final   Organism ID, Bacteria ESCHERICHIA COLI  Final      Susceptibility   Escherichia coli - MIC*    AMPICILLIN >=32 RESISTANT Resistant     CEFAZOLIN <=4 SENSITIVE Sensitive     CEFEPIME <=1 SENSITIVE Sensitive     CEFTAZIDIME <=1 SENSITIVE Sensitive     CEFTRIAXONE <=1 SENSITIVE Sensitive     CIPROFLOXACIN <=0.25 SENSITIVE Sensitive     GENTAMICIN >=16 RESISTANT Resistant     IMIPENEM <=0.25 SENSITIVE Sensitive     TRIMETH/SULFA >=320 RESISTANT Resistant     AMPICILLIN/SULBACTAM >=32 RESISTANT Resistant     PIP/TAZO <=4 SENSITIVE Sensitive     Extended ESBL NEGATIVE Sensitive     * FEW ESCHERICHIA COLI  MRSA PCR Screening     Status: None   Collection Time: 06/15/18 10:22 PM  Result Value Ref Range Status   MRSA by PCR NEGATIVE NEGATIVE Final    Comment:        The GeneXpert MRSA Assay (FDA approved for NASAL specimens only), is one component of a comprehensive MRSA colonization surveillance program. It is not intended to diagnose MRSA infection nor to guide or monitor treatment for MRSA infections. Performed at [facility], [place] [address] [place], [place] [address]       Studies: No results found.  Scheduled Meds: . albuterol  2.5 mg Nebulization TID  . amoxicillin-clavulanate  1 tablet Oral Q12H  . gabapentin  300 mg Oral QHS  . lip balm  1 application Topical BID  . metoprolol tartrate  25 mg Oral BID  . mometasone-formoterol  2 puff Inhalation BID  . pantoprazole (PROTONIX) IV  40 mg Intravenous QHS  . potassium chloride  20 mEq Oral BID  . predniSONE  10 mg Oral Q breakfast  . tamsulosin  0.4 mg Oral BID    Continuous Infusions: . methocarbamol (ROBAXIN) IV 1,000 mg (06/23/18 1439)  . ondansetron (ZOFRAN) IV       LOS: 8 days     Kayleen Memos, MD [facility] Pager [phone]  If 7PM-7AM, please contact night-coverage [email or web address] Password [email or web address] 06/23/2018, 5:25 PM

## 2018-06-23 NOTE — Care Management Important Message (Signed)
Important Message  Patient Details  Name: [name] MRN: [id number] Date of Birth: [birth year]   Medicare Important Message Given:  Yes    Kerin Salen 06/23/2018, 11:58 [place] Message  Patient Details  Name: [name] MRN: [id number] Date of Birth: 1945/11/28   Medicare Important Message Given:  Yes    Kerin Salen 06/23/2018, 11:58 AM

## 2018-06-24 LAB — BASIC METABOLIC PANEL
Anion gap: 9 (ref 5–15)
BUN: 9 mg/dL (ref 8–23)
CO2: 26 mmol/L (ref 22–32)
Calcium: 8.3 mg/dL — ABNORMAL LOW (ref 8.9–10.3)
Chloride: 103 mmol/L (ref 98–111)
Creatinine, Ser: 0.77 mg/dL (ref 0.61–1.24)
GFR calc Af Amer: >60 mL/min (ref >60)
GFR calc non Af Amer: >60 mL/min (ref >60)
Glucose, Bld: 109 mg/dL — ABNORMAL HIGH (ref 70–99)
Potassium: 3.9 mmol/L (ref 3.5–5.1)
Sodium: 138 mmol/L (ref 135–145)

## 2018-06-24 LAB — HEPATIC FUNCTION PANEL
ALT: 30 U/L (ref 0–44)
AST: 14 U/L — ABNORMAL LOW (ref 15–41)
Albumin: 2.4 g/dL — ABNORMAL LOW (ref 3.5–5.0)
Alkaline Phosphatase: 134 U/L — ABNORMAL HIGH (ref 38–126)
Bilirubin, Direct: 0.1 mg/dL (ref 0.0–0.2)
Indirect Bilirubin: 0.6 mg/dL (ref 0.3–0.9)
Total Bilirubin: 0.7 mg/dL (ref 0.3–1.2)
Total Protein: 5.1 g/dL — ABNORMAL LOW (ref 6.5–8.1)

## 2018-06-24 LAB — CBC
HCT: 38.2 % — ABNORMAL LOW (ref 39.0–52.0)
Hemoglobin: 12.6 g/dL — ABNORMAL LOW (ref 13.0–17.0)
MCH: 36.5 pg — ABNORMAL HIGH (ref 26.0–34.0)
MCHC: 33 g/dL (ref 30.0–36.0)
MCV: 110.7 fL — ABNORMAL HIGH (ref 80.0–100.0)
Platelets: 353 10*3/uL (ref 150–400)
RBC: 3.45 MIL/uL — ABNORMAL LOW (ref 4.22–5.81)
RDW: 13.2 % (ref 11.5–15.5)
WBC: 9.2 10*3/uL (ref 4.0–10.5)
nRBC: 0 % (ref 0.0–0.2)

## 2018-06-24 MED ORDER — TRAMADOL HCL 50 MG PO TABS: 50.0000 mg | ORAL_TABLET | Freq: Two times a day (BID) | ORAL | 0 refills | Status: DC | PRN

## 2018-06-24 MED ORDER — METRONIDAZOLE 500 MG PO TABS: 500.0000 mg | ORAL_TABLET | Freq: Three times a day (TID) | ORAL | 0 refills | Status: AC

## 2018-06-24 MED ORDER — CEPHALEXIN 500 MG PO CAPS: 500.0000 mg | ORAL_CAPSULE | Freq: Three times a day (TID) | ORAL | 0 refills | Status: AC

## 2018-06-24 MED ORDER — METRONIDAZOLE 500 MG PO TABS
500.0000 mg | ORAL_TABLET | Freq: Three times a day (TID) | ORAL | Status: DC
  Administered 2018-06-24: 500 mg via ORAL
  Filled 2018-06-24: qty 1

## 2018-06-24 MED ORDER — CEPHALEXIN 500 MG PO CAPS
500.0000 mg | ORAL_CAPSULE | Freq: Three times a day (TID) | ORAL | Status: DC
  Administered 2018-06-24: 500 mg via ORAL
  Filled 2018-06-24: qty 1

## 2018-06-24 MED ORDER — DILTIAZEM HCL ER COATED BEADS 240 MG PO CP24
240.0000 mg | ORAL_CAPSULE | Freq: Every day | ORAL | Status: DC
  Administered 2018-06-24: 240 mg via ORAL
  Filled 2018-06-24: qty 1

## 2018-06-24 MED ORDER — ALBUTEROL SULFATE (2.5 MG/3ML) 0.083% IN NEBU: 2.5000 mg | INHALATION_SOLUTION | Freq: Two times a day (BID) | RESPIRATORY_TRACT | Status: DC

## 2018-06-24 NOTE — Progress Notes (Signed)
CMT called to report pt HR elevated in 130-140's. 12 lead EKG completed, ST noted, MD made aware, ST with PAC. Pt resting in bed, slightly flushed otherwise asymptomatic. Wife at bedside with patient.  AM meds given as ordered. Will cont to monitor. SRP, RN

## 2018-06-24 NOTE — Progress Notes (Signed)
HR decreased to 90 to low 100"s after HR med given as ordered, pt remains asytomatic and has appointment to see Cardiologist outpatient.  Pt stable and ready for discharge. SRP, RN

## 2018-06-24 NOTE — Discharge Summary (Signed)
Discharge Summary  [name] HER:[id number] DOB: 08-06-1945  PCP: [name], MD  Admit date: 06/15/2018 Discharge date: 06/24/2018  Time spent: 35 minutes    Recommendations for Outpatient Follow-up:  1. Follow up with your cardiologist 2. Follow up with your Pulmonologist 3. Follow up with your PCP 4. Take your medications as prescribed   Discharge Diagnoses:  Active Hospital Problems   Diagnosis Date Noted  . Sigmoid colon perforation s/p Hartmann colectomy/colostomy 06/15/2018 06/15/2018  . OSA (obstructive sleep apnea) 12/13/2016  . PAF (paroxysmal atrial fibrillation) ([place]) 08/14/2016  . ILD (interstitial lung disease) ([place]) 05/01/2016  . Essential hypertension 04/19/2013  . Abnormal LFTs 09/16/2012    Resolved Hospital Problems   Diagnosis Date Noted Date Resolved  . Diverticulitis of intestine with perforation and abscess 06/15/2018 06/16/2018  . Diverticulitis of intestine with perforation 06/15/2018 06/16/2018    Discharge Condition: Stable   Diet recommendation: Resume previous diet   Vitals:   06/24/18 0815 06/24/18 0950  BP:  114/66  Pulse: 81 95  Resp: 17   Temp:  97.8 F (36.6 C)  SpO2: 95%     History of present illness:  Patient is 73 year old male with a history ofinterstitial lung disease on prednisone, CellCept, and nintedanib,Paroxysmal A. 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 No nausea, vomiting, or fevers. He does have chronic shortness of breath due to ILD.  CT abdomen pelvis showed proximal sigmoid diverticulitis with large amount of free air in the adjacent peritoneal fat and extending in the epigastric region consistent with perforation. 5.2 cm x 4.3 cm fluid collection with air-fluid levels in the area concerning for contained perforation or developing abscess. Mild small bowel  dilatation may represent ileus or possibly obstruction.  Hospital course complicated by bleeding around the wound VAC on 06/21/18.  Heparin held and Wound VAC removed on 06/22/2018. Resumed regular diet which he tolerated well and was restarted on his po medications on 06/23/18.  06/24/2018: Patient seen and examined at bedside.  No acute events overnight.  He has no new complaints.  Pain is well controlled with current pain management.  Denies nausea, abdominal cramping, chest pain, palpitations or dyspnea.  Twelve-lead EKG done on 06/24/2018 revealed prolonged QTC 624.  Appointment made for the patient to follow up closely with his cardiologist office. PA Kerin Ransom will see the patient tomorrow 06/25/18 at 10:30 AM.  On the day of discharge, the patient was hemodynamically stable.  He will need to follow-up with his cardiologist, his pulmonologist, his general surgeon, his PCPs posthospitalization.  He will also need to take his medications as prescribed.      Hospital Course:  Principal Problem:   Sigmoid colon perforation s/p Hartmann colectomy/colostomy 06/15/2018 Active Problems:   Abnormal LFTs   Essential hypertension   ILD (interstitial lung disease) (HCC)   PAF (paroxysmal atrial fibrillation) (HCC)   OSA (obstructive sleep apnea)  Acute sigmoid diverticulitis with perforation s/p Hartmann colectomy/colostomy 06/15/2018 with ileus -Postop day #9, follow up with general surgery outpatient -Underwent exploratory laparotomy with sigmoid colon resection and end colostomy -Intra-Op culture report E. coli which is sensitive to Zosyn -Completed 8 days of IV Zosyn  -Wound VAC removed on 06/22/2018 by general surgery.  Dressing changes twice daily. -Surgery started on Augmentin x 7 days on 06/23/18 which was dc on 06/24/18 due to intra-op culture  resistance. -Started on po Keflex and po flagyl TID x 7 days on 06/24/18. -Tolerating a regular diet -Afebrile with no leukocytosis -Reviewed Vital  signs and labs on 06/24/18 which are stable.  Resolving acute blood loss from wound VAC on 06/21/2018 Hemoglobin is stable  Small bowel distention ?Postoperative ileus Managed by surgery Continue to mobilize  Resolving Abnormal LFTs -In the setting of acute diverticulitis, perforation -Acute hepatitis panel negative -ALT trending down Follow up with your PCP  Resolved uncontrolled hypertension Back on home antihypertensive medications  ILD (interstitial lung disease) (HCC) -Continue to hold CellCept, nintedanib, has been on prednisone for many years -Continue prednisone -Continue Symbicort, nebulizer treatments  PAF (paroxysmal atrial fibrillation) (HCC) -Restarted Eliquis per general surgery on 06/23/2018  Prolonged QTC Asymptomatic 12 lead EKG revealed sinus tachycardia with QTC 624 Called his cardiologist office for close follow up Appointment made for tomorrow 06/25/18 at 10: 30 at [facility] office. Avoid QTC prolonging agents Hold off zofran  Code Status:Full CODE STATUS    Procedures:  Expiratory laparotomy with sigmoid colon resection and colostomy Wound Vac placement   Consultants: General surgery Wound care specialist    Discharge Exam: BP 114/66   Pulse 95   Temp 97.8 F (36.6 C) (Oral)   Resp 17   Ht 5\' 9"  (1.753 m)   Wt 83 kg   SpO2 95%   BMI 27.02 kg/m  . General: 73 y.o. year-old male well developed well nourished in no acute distress.  Alert and oriented x3. . Cardiovascular: Regular rate and rhythm with no rubs or gallops.  No thyromegaly or JVD noted.   Marland Kitchen Respiratory: Clear to auscultation with no wheezes or rales. Good inspiratory effort. . Abdomen: Soft nontender nondistended with normal bowel sounds x4 quadrants. Colostomy bag in place with brown stool output. . Musculoskeletal: No lower extremity edema. 2/4 pulses in all 4 extremities. Marland Kitchen Psychiatry: Mood is appropriate for condition and setting  Discharge  Instructions You were cared for by a hospitalist during your hospital stay. If you have any questions about your discharge medications or the care you received while you were in the hospital after you are discharged, you can call the unit and asked to speak with the hospitalist on call if the hospitalist that took care of you is not available. Once you are discharged, your primary care physician will handle any further medical issues. Please note that NO REFILLS for any discharge medications will be authorized once you are discharged, as it is imperative that you return to your primary care physician (or establish a relationship with a primary care physician if you do not have one) for your aftercare needs so that they can reassess your need for medications and monitor your lab values.   Allergies as of 06/24/2018      Reactions   Lidocaine Hcl Anaphylaxis, Other (See Comments)   Xylocaine   Xylocaine [lidocaine] Anaphylaxis   Codeine Nausea And Vomiting   Crestor [rosuvastatin Calcium] Other (See Comments)   All-over body aches Myalgias    Statins Other (See Comments)   Muscle soreness and an aching feeling all over Myalgias   Percocet [oxycodone-acetaminophen] Itching      Medication List    STOP taking these medications   AEROCHAMBER MV inhaler   diphenoxylate-atropine 2.5-0.025 MG tablet Commonly known as:  LOMOTIL   MSM 1000 MG Caps   mycophenolate 500 MG tablet Commonly known as:  CELLCEPT   Nintedanib 100 MG Caps Commonly known as:  OFEV  ondansetron 4 MG tablet Commonly known as:  ZOFRAN   sulfamethoxazole-trimethoprim 800-160 MG tablet Commonly known as:  BACTRIM DS,SEPTRA DS     TAKE these medications   acetaminophen 500 MG tablet Commonly known as:  TYLENOL Take 1,000 mg by mouth every 6 (six) hours as needed for mild pain or headache.   albuterol 108 (90 Base) MCG/ACT inhaler Commonly known as:  PROVENTIL HFA;VENTOLIN HFA Inhale 2 puffs into the lungs  every 4 (four) hours as needed for wheezing or shortness of breath.   budesonide-formoterol 80-4.5 MCG/ACT inhaler Commonly known as:  SYMBICORT Inhale 2 puffs into the lungs 2 (two) times daily.   CARTIA XT 240 MG 24 hr capsule Generic drug:  diltiazem TAKE ONE CAPSULE BY MOUTH DAILY What changed:  how much to take   cephALEXin 500 MG capsule Commonly known as:  KEFLEX Take 1 capsule (500 mg total) by mouth every 8 (eight) hours for 7 days.   cetirizine 10 MG tablet Commonly known as:  ZYRTEC Take 10 mg by mouth daily as needed for allergies.   ELIQUIS 5 MG Tabs tablet Generic drug:  apixaban TAKE ONE TABLET (5MG  TOTAL) BY MOUTH TWODAILY What changed:  See the new instructions.   ezetimibe 10 MG tablet Commonly known as:  ZETIA TAKE ONE (1) TABLET BY MOUTH EVERY DAY What changed:  See the new instructions.   Fish Oil 500 MG Caps Take 1 capsule by mouth 2 (two) times daily. What changed:  Another medication with the same name was removed. Continue taking this medication, and follow the directions you see here.   fluticasone 50 MCG/ACT nasal spray Commonly known as:  FLONASE Place 2 sprays into both nostrils daily as needed for allergies or rhinitis.   gabapentin 300 MG capsule Commonly known as:  NEURONTIN Take 300 mg by mouth at bedtime.   metoprolol tartrate 25 MG tablet Commonly known as:  LOPRESSOR TAKE ONE TABLET BY MOUTH TWICE A DAY   metroNIDAZOLE 500 MG tablet Commonly known as:  FLAGYL Take 1 tablet (500 mg total) by mouth every 8 (eight) hours for 7 days.   oxyCODONE 5 MG immediate release tablet Commonly known as:  Oxy IR/ROXICODONE Take 1 tablet (5 mg total) by mouth every 6 (six) hours as needed for moderate pain or severe pain.   predniSONE 10 MG tablet Commonly known as:  DELTASONE Take 1 tablet (10 mg total) by mouth daily with breakfast.   tamsulosin 0.4 MG Caps capsule Commonly known as:  FLOMAX Take 0.4 mg by mouth 2 (two) times daily.     traMADol 50 MG tablet Commonly known as:  ULTRAM Take 50 mg by mouth every 6 (six) hours as needed for moderate pain or severe pain. What changed:  Another medication with the same name was added. Make sure you understand how and when to take each.   traMADol 50 MG tablet Commonly known as:  ULTRAM Take 1 tablet (50 mg total) by mouth every 12 (twelve) hours as needed for moderate pain. What changed:  You were already taking a medication with the same name, and this prescription was added. Make sure you understand how and when to take each.   TRIPLE ANTIBIOTIC [phone] Oint Apply 1 application topically daily.            Durable Medical Equipment  (From admission, onward)         Start     Ordered   06/20/18 1427  For home use only DME  Walker rolling  Once    Comments:  Rolling walker with 5 inch wheels as recommended by PT  Question:  Patient needs a walker to treat with the following condition  Answer:  Ambulatory dysfunction   06/20/18 1426   06/17/18 1212  For home use only DME Walker rolling  Once    Question:  Patient needs a walker to treat with the following condition  Answer:  Perforated sigmoid colon ([place])   06/17/18 1211         Allergies  Allergen Reactions  . Lidocaine Hcl Anaphylaxis and Other (See Comments)    Xylocaine  . Xylocaine [Lidocaine] Anaphylaxis  . Codeine Nausea And Vomiting  . Crestor [Rosuvastatin Calcium] Other (See Comments)    All-over body aches Myalgias   . Statins Other (See Comments)    Muscle soreness and an aching feeling all over Myalgias  . Percocet [Oxycodone-Acetaminophen] Itching   Follow-up Information    [name], MD. Go on 07/08/2018.   Specialty:  General Surgery Why:  Follow up appointment scheduled for 11:20 AM. Please arrive 30 min prior to appointment time. Bring photo ID and insurance information.  Contact information: [address] [place] [address] [phone]        Troy Sine, MD. Go on 06/25/2018.   Specialty:  Cardiology Why:  Follow up appointment scheduled for 06/25/18 at 10:30 am with PA [facility]. Contact information: [address] [place] [address] [phone]            The results of significant diagnostics from this hospitalization (including imaging, microbiology, ancillary and laboratory) are listed below for reference.    Significant Diagnostic Studies: Ct Abdomen Pelvis W Contrast  Result Date: 06/15/2018 CLINICAL DATA:  Acute generalized abdominal pain. EXAM: CT ABDOMEN AND PELVIS WITH CONTRAST TECHNIQUE: Multidetector CT imaging of the abdomen and pelvis was performed using the standard protocol following bolus administration of intravenous contrast. CONTRAST:  136mL ISOVUE-300 IOPAMIDOL (ISOVUE-300) INJECTION 61% COMPARISON:  CT scan of Sep 22, 2012. FINDINGS: Lower chest: No acute abnormality. Hepatobiliary: No focal liver abnormality is seen. Status post cholecystectomy. No biliary dilatation. Pancreas: Unremarkable. No pancreatic ductal dilatation or surrounding inflammatory changes. Spleen: Normal in size without focal abnormality. Adrenals/Urinary Tract: Adrenal glands are unremarkable. Kidneys are normal, without renal calculi, focal lesion, or hydronephrosis. Bladder is unremarkable. Stomach/Bowel: The stomach appears normal. Status post appendectomy. Proximal sigmoid diverticulitis is noted with a large amount of free air in the adjacent peritoneal fat and extending into the epigastric region consistent with perforation. 5.2 x 4.3 cm fluid collection with air-fluid level is noted in this area concerning for contained perforation or developing abscess. Mild small bowel dilatation is noted which may represent ileus or possibly obstruction. Vascular/Lymphatic: No significant vascular findings are present. No enlarged abdominal or pelvic lymph nodes. Reproductive: Prostate is unremarkable. Other: Mild fat containing left  inguinal hernia is noted. Musculoskeletal: Status post surgical posterior fusion of L2-3, L3-4 and L4-5. No acute osseous abnormality is noted. IMPRESSION: Proximal sigmoid diverticulitis is noted with a large amount of free air in the adjacent peritoneal fat and extending in the epigastric region consistent with perforation. 5.2 x 4.3 cm fluid collection with air-fluid level is noted in this area concerning for contained perforation or developing abscess. Mild small bowel dilatation is noted which may represent ileus or possibly obstruction. Critical Value/emergent results were called by telephone at the time of interpretation on 06/15/2018 at 3:13 pm  to Dr. Reece Agar , who verbally acknowledged these results. Electronically Signed   By: Marijo Conception, M.D.   On: 06/15/2018 15:14   Dg Abd Portable 1v  Result Date: 06/22/2018 CLINICAL DATA:  Abdominal distention EXAM: PORTABLE ABDOMEN - 1 VIEW COMPARISON:  Yesterday FINDINGS: Continued small bowel distension by gas. Colonic gas is present without distention. No concerning mass effect or calcification. IMPRESSION: Continued small bowel distention. Clinical setting favors postoperative ileus, although there is preferential distension of small bowel and the previously seen perforation involved the small bowel mesentery, partial obstruction is also considered. Electronically Signed   By: Monte Fantasia M.D.   On: 06/22/2018 12:24   Dg Abd Portable 2v  Result Date: 06/21/2018 CLINICAL DATA:  Lower abdominal pain and cramping today, had sigmoid colon resection on 06/15/2018 EXAM: PORTABLE ABDOMEN - 2 VIEW COMPARISON:  CT abdomen and pelvis 06/15/2018 FINDINGS: Air-filled dilated loops of small bowel throughout the mid abdomen likely postoperative ileus. Scattered gas and stool in colon. No definite bowel wall thickening or free air. Prior lumbar fusion L2-L5. Bones demineralized. No urine tract calcification. IMPRESSION: Diffuse gaseous distention of small  bowel loops question postoperative ileus. Electronically Signed   By: Lavonia Dana M.D.   On: 06/21/2018 15:51    Microbiology: Recent Results (from the past 240 hour(s))  Aerobic/Anaerobic Culture (surgical/deep wound)     Status: None   Collection Time: 06/15/18  8:17 PM  Result Value Ref Range Status   Specimen Description   Final    ABSCESS Performed at [facility] [address], [place], [place] [address]    Special Requests   Final    NONE Performed at [facility], [place] [address], [place], [place] [address]    Gram Stain   Final    MODERATE WBC PRESENT, PREDOMINANTLY PMN FEW GRAM POSITIVE COCCI FEW GRAM POSITIVE RODS Performed at [facility], [place] [address], [place], [place] [address]    Culture   Final    FEW ESCHERICHIA COLI MIXED ANAEROBIC FLORA PRESENT.  CALL LAB IF FURTHER IID REQUIRED.    Report Status 06/18/2018 FINAL  Final   Organism ID, Bacteria ESCHERICHIA COLI  Final      Susceptibility   Escherichia coli - MIC*    AMPICILLIN >=32 RESISTANT Resistant     CEFAZOLIN <=4 SENSITIVE Sensitive     CEFEPIME <=1 SENSITIVE Sensitive     CEFTAZIDIME <=1 SENSITIVE Sensitive     CEFTRIAXONE <=1 SENSITIVE Sensitive     CIPROFLOXACIN <=0.25 SENSITIVE Sensitive     GENTAMICIN >=16 RESISTANT Resistant     IMIPENEM <=0.25 SENSITIVE Sensitive     TRIMETH/SULFA >=320 RESISTANT Resistant     AMPICILLIN/SULBACTAM >=32 RESISTANT Resistant     PIP/TAZO <=4 SENSITIVE Sensitive     Extended ESBL NEGATIVE Sensitive     * FEW ESCHERICHIA COLI  MRSA PCR Screening     Status: None   Collection Time: 06/15/18 10:22 PM  Result Value Ref Range Status   MRSA by PCR NEGATIVE NEGATIVE Final    Comment:        The GeneXpert MRSA Assay (FDA approved for NASAL specimens only), is one component of a comprehensive MRSA colonization surveillance program. It is not intended to diagnose MRSA infection nor to guide or monitor treatment  for MRSA infections. Performed at [facility], [place] [address] [place], [place] [address]      Labs: Basic Metabolic Panel: Recent Labs  Lab 06/20/18 0410 06/21/18 0250 06/22/18 0313 06/23/18 0447 06/24/18 0515  NA 137 137 138 138 138  K 3.5 4.3 3.5 3.9 3.9  CL 107 105 105 107 103  CO2 22 23 24 24 26   GLUCOSE 110* 100* 119* 148* 109*  BUN 11 11 10 11 9   CREATININE 0.68 0.70 0.64 0.73 0.77  CALCIUM 8.1* 8.1* 8.5* 8.1* 8.3*  MG  --   --   --  2.3  --   PHOS  --   --   --  3.3  --    Liver Function Tests: Recent Labs  Lab 06/18/18 0426 06/20/18 0410 06/21/18 0250 06/22/18 0313 06/24/18 0515  AST 40 26 50* 16 14*  ALT 59* 45* 46* 38 30  ALKPHOS 97 131* 184* 156* 134*  BILITOT 0.7 0.7 2.5* 0.9 0.7  PROT 5.1* 5.4* 5.4* 5.2* 5.1*  ALBUMIN 2.0* 2.3* 2.4* 2.3* 2.4*   No results for input(s): LIPASE, AMYLASE in the last 168 hours. No results for input(s): AMMONIA in the last 168 hours. CBC: Recent Labs  Lab 06/20/18 0410 06/21/18 0250 06/22/18 0313 06/23/18 0447 06/24/18 0515  WBC 9.8 10.6* 7.8 7.9 9.2  HGB 12.9* 13.0 12.3* 12.2* 12.6*  HCT 38.6* 38.8* 36.5* 36.7* 38.2*  MCV 106.0* 109.6* 108.0* 106.4* 110.7*  PLT 278 291 298 347 353   Cardiac Enzymes: No results for input(s): CKTOTAL, CKMB, CKMBINDEX, TROPONINI in the last 168 hours. BNP: BNP (last 3 results) No results for input(s): BNP in the last 8760 hours.  ProBNP (last 3 results) No results for input(s): PROBNP in the last 8760 hours.  CBG: No results for input(s): GLUCAP in the last 168 hours.     Signed:  Kayleen Memos, MD [facility] 06/24/2018, 1:09 PM

## 2018-06-24 NOTE — Progress Notes (Signed)
Pt discharged to home, instructions provided and reviewed with patient and wife. Wife acknowledged understanding of incisional care and colostomy care. Supplies provided to pt and wife to start home at home until [facility] is able to make their initial assessment. Pt stable and denies pain, able to move with minimal discomfort. SRP, RN

## 2018-06-24 NOTE — Consult Note (Signed)
   [facility] CM Inpatient Consult   06/24/2018  [place] [birth year] [id number]    Patient screened for potential [facility] Care Management services due to medium unplanned readmission risk score and extended length of hospitalization.  Went to bedside to speak with Mr. Coleman and wife about [place] Management services. Mr. Danis pleasantly declines [place] Management program services. States he will have home health and "that will be enough." Provided [facility] Care Management brochure with contact information to call in the future if needed.   Marthenia Rolling, MSN-Ed, RN,BSN [facility] Liaison [phone]

## 2018-06-25 ENCOUNTER — Encounter: Payer: Self-pay | Admitting: Cardiology

## 2018-06-25 ENCOUNTER — Ambulatory Visit: Payer: PPO | Admitting: Cardiology

## 2018-06-25 VITALS — BP 100/58 | HR 75 | Ht 69.0 in | Wt 168.2 lb

## 2018-06-25 DIAGNOSIS — I10 Essential (primary) hypertension: Secondary | ICD-10-CM | POA: Diagnosis not present

## 2018-06-25 DIAGNOSIS — Z433 Encounter for attention to colostomy: Secondary | ICD-10-CM | POA: Diagnosis not present

## 2018-06-25 DIAGNOSIS — Z48815 Encounter for surgical aftercare following surgery on the digestive system: Secondary | ICD-10-CM | POA: Diagnosis not present

## 2018-06-25 DIAGNOSIS — I480 Paroxysmal atrial fibrillation: Secondary | ICD-10-CM

## 2018-06-25 DIAGNOSIS — Z7901 Long term (current) use of anticoagulants: Secondary | ICD-10-CM | POA: Diagnosis not present

## 2018-06-25 DIAGNOSIS — Z7951 Long term (current) use of inhaled steroids: Secondary | ICD-10-CM | POA: Diagnosis not present

## 2018-06-25 DIAGNOSIS — I4581 Long QT syndrome: Secondary | ICD-10-CM | POA: Diagnosis not present

## 2018-06-25 DIAGNOSIS — R945 Abnormal results of liver function studies: Secondary | ICD-10-CM | POA: Diagnosis not present

## 2018-06-25 DIAGNOSIS — G4733 Obstructive sleep apnea (adult) (pediatric): Secondary | ICD-10-CM | POA: Diagnosis not present

## 2018-06-25 DIAGNOSIS — I739 Peripheral vascular disease, unspecified: Secondary | ICD-10-CM | POA: Diagnosis not present

## 2018-06-25 DIAGNOSIS — K5720 Diverticulitis of large intestine with perforation and abscess without bleeding: Secondary | ICD-10-CM

## 2018-06-25 DIAGNOSIS — J849 Interstitial pulmonary disease, unspecified: Secondary | ICD-10-CM | POA: Diagnosis not present

## 2018-06-25 DIAGNOSIS — I422 Other hypertrophic cardiomyopathy: Secondary | ICD-10-CM

## 2018-06-25 DIAGNOSIS — R9431 Abnormal electrocardiogram [ECG] [EKG]: Secondary | ICD-10-CM

## 2018-06-25 DIAGNOSIS — Z7952 Long term (current) use of systemic steroids: Secondary | ICD-10-CM | POA: Diagnosis not present

## 2018-06-25 MED ORDER — DILTIAZEM HCL ER COATED BEADS 120 MG PO CP24: 120.0000 mg | ORAL_CAPSULE | Freq: Every day | ORAL | 3 refills | Status: DC

## 2018-06-25 NOTE — Assessment & Plan Note (Signed)
Followed by Dr McQuaid.   

## 2018-06-25 NOTE — Progress Notes (Signed)
06/25/2018 [place]   July 19, 1945  [id number]  Primary Physician [name], MD Primary Cardiologist: Dr Claiborne Billings  HPI: Patient is a 73 year old male followed by Dr. Claiborne Billings.  He has a history of remote myocarditis in 1989.  His ejection fraction then was 25%, this subsequently recovered.  Echocardiogram in 2017 showed an ejection fraction of 60 to 65%.  He had mild coronary disease at catheterization in 2001, nuclear stress test in 2017 was low risk.  The patient has had PAF in the past and is on Eliquis.  Other medical issues include treated hypertension and dyslipidemia with statin intolerance.  Patient also has PMR and interstitial lung disease.  He is on chronic steroids and inhalers and was on Cellcept and Ofev, followed by Dr Lake Bells.    He was just admitted to the hospital 06/15/2018 with acute diverticulitis requiring exploratory laparotomy and colostomy. The pt was taken off Cellcept and Ofev on admission  Postoperatively his QTC was noted to be prolonged- his QTC on 06/24/2018 was 624. Antibiotics were adjusted.  He is referred today for further evaluation and f/u EKG.  His QTC today in the office is 447. He is slowly improving.  No palpitations or tachycardia, no unusual dyspnea.  He has noted his B/P has been running [id number]'G systolic.    Current Outpatient Medications  Medication Sig Dispense Refill  . acetaminophen (TYLENOL) 500 MG tablet Take 1,000 mg by mouth every 6 (six) hours as needed for mild pain or headache.     . albuterol (PROVENTIL HFA;VENTOLIN HFA) 108 (90 Base) MCG/ACT inhaler Inhale 2 puffs into the lungs every 4 (four) hours as needed for wheezing or shortness of breath.     . budesonide-formoterol (SYMBICORT) 80-4.5 MCG/ACT inhaler Inhale 2 puffs into the lungs 2 (two) times daily. 1 Inhaler 12  . cephALEXin (KEFLEX) 500 MG capsule Take 1 capsule (500 mg total) by mouth every 8 (eight) hours for 7 days. 21 capsule 0  . cetirizine (ZYRTEC) 10 MG tablet Take 10  mg by mouth daily as needed for allergies.     Marland Kitchen diltiazem (CARDIZEM CD) 120 MG 24 hr capsule Take 1 capsule (120 mg total) by mouth daily. 30 capsule 3  . ELIQUIS 5 MG TABS tablet TAKE ONE TABLET (5MG  TOTAL) BY MOUTH TWODAILY 180 tablet 1  . ezetimibe (ZETIA) 10 MG tablet TAKE ONE (1) TABLET BY MOUTH EVERY DAY 90 tablet 3  . fluticasone (FLONASE) 50 MCG/ACT nasal spray Place 2 sprays into both nostrils daily as needed for allergies or rhinitis.    Marland Kitchen gabapentin (NEURONTIN) 300 MG capsule Take 300 mg by mouth at bedtime.     . metoprolol tartrate (LOPRESSOR) 25 MG tablet TAKE ONE TABLET BY MOUTH TWICE A DAY 180 tablet 3  . metroNIDAZOLE (FLAGYL) 500 MG tablet Take 1 tablet (500 mg total) by mouth every 8 (eight) hours for 7 days. 21 tablet 0  . Neomycin-Bacitracin-Polymyxin (TRIPLE ANTIBIOTIC) [phone] OINT Apply 1 application topically daily.    . Omega-3 Fatty Acids (FISH OIL) 500 MG CAPS Take 1 capsule by mouth 2 (two) times daily.    Marland Kitchen oxyCODONE (OXY IR/ROXICODONE) 5 MG immediate release tablet Take 1 tablet (5 mg total) by mouth every 6 (six) hours as needed for moderate pain or severe pain. 30 tablet 0  . predniSONE (DELTASONE) 10 MG tablet Take 1 tablet (10 mg total) by mouth daily with breakfast. 30 tablet 5  . tamsulosin (FLOMAX) 0.4 MG CAPS capsule Take  0.4 mg by mouth 2 (two) times daily.     . traMADol (ULTRAM) 50 MG tablet Take 50 mg by mouth every 6 (six) hours as needed for moderate pain or severe pain.     . traMADol (ULTRAM) 50 MG tablet Take 1 tablet (50 mg total) by mouth every 12 (twelve) hours as needed for moderate pain. 15 tablet 0   No current facility-administered medications for this visit.     Allergies  Allergen Reactions  . Lidocaine Hcl Anaphylaxis and Other (See Comments)    Xylocaine  . Xylocaine [Lidocaine] Anaphylaxis  . Codeine Nausea And Vomiting  . Crestor [Rosuvastatin Calcium] Other (See Comments)    All-over body aches Myalgias   . Statins Other  (See Comments)    Muscle soreness and an aching feeling all over Myalgias  . Percocet [Oxycodone-Acetaminophen] Itching    Past Medical History:  Diagnosis Date  . Arthritis   . Chest pain    a. 03/2017: echo showing EF of 60-65%, no regional WMA or significant valve abnormalities. b. 03/2016: NST with no evidence of ischemia.   . Digestive problems    on Prednisone prn for this issue- diarrhea  . Dyspnea    with activity  . Dysrhythmia    irregular due to Myocarditis- takes Atenolol, Norvasc  . GERD (gastroesophageal reflux disease)   . H/O viral myocarditis    25 years ago  . Head injury, closed, with concussion    Breif LOC  . Hyperlipidemia   . Mild mitral valve prolapse    per Dr Evette Georges notes  . PAF (paroxysmal atrial fibrillation) ([place])    a. initially occuring in 02/2016. b. recurrent in 07/2016. Placed on Eliquis  . Polymyalgia rheumatica ([place])   . Pre-diabetes   . Sleep apnea    mild sleep apnea   no CPAP    Social History   Socioeconomic History  . Marital status: Married    Spouse name: Not on file  . Number of children: Not on file  . Years of education: Not on file  . Highest education level: Not on file  Occupational History  . Not on file  Social Needs  . Financial resource strain: Not on file  . Food insecurity:    Worry: Not on file    Inability: Not on file  . Transportation needs:    Medical: Not on file    Non-medical: Not on file  Tobacco Use  . Smoking status: Never Smoker  . Smokeless tobacco: Never Used  Substance and Sexual Activity  . Alcohol use: Yes    Alcohol/week: 21.0 standard drinks    Types: 21 Glasses of wine per week    Comment: 2/3 glasses wine in evening  . Drug use: No  . Sexual activity: Yes    Birth control/protection: Other-see comments    Comment: old age  Lifestyle  . Physical activity:    Days per week: Not on file    Minutes per session: Not on file  . Stress: Not on file  Relationships  . Social  connections:    Talks on phone: Not on file    Gets together: Not on file    Attends religious service: Not on file    Active member of club or organization: Not on file    Attends meetings of clubs or organizations: Not on file    Relationship status: Not on file  . Intimate partner violence:    Fear of current or ex  partner: Not on file    Emotionally abused: Not on file    Physically abused: Not on file    Forced sexual activity: Not on file  Other Topics Concern  . Not on file  Social History Narrative  . Not on file     Family History  Problem Relation Age of Onset  . Rheum arthritis Mother   . Thyroid cancer Mother   . Heart disease Father   . Asthma Father   . Thyroid cancer Sister   . Melanoma Brother      Review of Systems: General: negative for chills, fever, night sweats or weight changes.  Cardiovascular: negative for chest pain, dyspnea on exertion, edema, orthopnea, palpitations, paroxysmal nocturnal dyspnea or shortness of breath Dermatological: negative for rash Respiratory: negative for cough or wheezing Urologic: negative for hematuria Abdominal: negative for nausea, vomiting, diarrhea, bright red blood per rectum, melena, or hematemesis Neurologic: negative for visual changes, syncope, or dizziness All other systems reviewed and are otherwise negative except as noted above.    Blood pressure (!) 100/58, pulse 75, height 5\' 9"  (1.753 m), weight 168 lb 3.2 oz (76.3 kg), SpO2 96 %.  General appearance: alert, cooperative and no distress Lungs: clear to auscultation bilaterally Heart: regular rate and rhythm Abdomen: colostomy in place Skin: pale, warm, dry Neurologic: Grossly normal  EKG NSR, HR 79, one PVC, QTc 447  ASSESSMENT AND PLAN:   Prolonged Q-T interval on ECG Prolonged QTc noted 06/24/2018- 624.-? Secondary to antibiotics Today his QTc is WNL- 447  PAF (paroxysmal atrial fibrillation) (HCC) Holding NSR- on Eliquis  Sigmoid colon  perforation s/p [facility] colectomy/colostomy 06/15/2018 S/P urgent colostomy secondary to acute diverticulitis  Other hypertrophic cardiomyopathy ([place]) Remote history of viral cardiomyopathy 1989; resolved  ILD (interstitial lung disease) ([place]) Followed by Dr Lake Bells   PLAN  I cut his Diltiazem back to 120 mg daily- start tomorrow. I told him to make sure he stays hydrated. He will f/u with Dr Claiborne Billings in a few months.   Kerin Ransom PA-C 06/25/2018 11:11 AM

## 2018-06-25 NOTE — Patient Instructions (Addendum)
Medication Instructions:  STOP Cartia 240mg   START Cartia 120mg  take 1 tablet once a day STARTING TOMORROW If you need a refill on your cardiac medications before your next appointment, please call your pharmacy.   Lab work: None  If you have labs (blood work) drawn today and your tests are completely normal, you will receive your results only by: Marland Kitchen MyChart Message (if you have MyChart) OR . A paper copy in the mail If you have any lab test that is abnormal or we need to change your treatment, we will call you to review the results.  Testing/Procedures: None   Follow-Up: At [facility], you and your health needs are our priority.  As part of our continuing mission to provide you with exceptional heart care, we have created designated Provider Care Teams.  These Care Teams include your primary Cardiologist (physician) and Advanced Practice Providers (APPs -  Physician Assistants and Nurse Practitioners) who all work together to provide you with the care you need, when you need it. You will need a follow up appointment in 3 months. You may see Shelva Majestic, MD or one of the following Advanced Practice Providers on your designated Care Team: [place], [place] . Fabian Sharp, PA-C  Any Other Special Instructions Will Be Listed Below (If Applicable).

## 2018-06-25 NOTE — Assessment & Plan Note (Addendum)
Prolonged QTc noted 06/24/2018- 624.-? Secondary to antibiotics Today his QTc is WNL- 447

## 2018-06-25 NOTE — Assessment & Plan Note (Signed)
Holding NSR- on Eliquis

## 2018-06-25 NOTE — Assessment & Plan Note (Signed)
S/P urgent colostomy secondary to acute diverticulitis

## 2018-06-25 NOTE — Assessment & Plan Note (Signed)
Remote history of viral cardiomyopathy 1989; resolved

## 2018-06-26 DIAGNOSIS — Z7952 Long term (current) use of systemic steroids: Secondary | ICD-10-CM | POA: Diagnosis not present

## 2018-06-26 DIAGNOSIS — R05 Cough: Secondary | ICD-10-CM | POA: Diagnosis not present

## 2018-06-26 DIAGNOSIS — Z7951 Long term (current) use of inhaled steroids: Secondary | ICD-10-CM | POA: Diagnosis not present

## 2018-06-26 DIAGNOSIS — Z433 Encounter for attention to colostomy: Secondary | ICD-10-CM | POA: Diagnosis not present

## 2018-06-26 DIAGNOSIS — G4733 Obstructive sleep apnea (adult) (pediatric): Secondary | ICD-10-CM | POA: Diagnosis not present

## 2018-06-26 DIAGNOSIS — Z48815 Encounter for surgical aftercare following surgery on the digestive system: Secondary | ICD-10-CM | POA: Diagnosis not present

## 2018-06-26 DIAGNOSIS — R945 Abnormal results of liver function studies: Secondary | ICD-10-CM | POA: Diagnosis not present

## 2018-06-26 NOTE — Telephone Encounter (Signed)
Pt is no longer admitted to hospital. lmtcb for pt/wife to follow up on cellcept and ofev.

## 2018-06-27 DIAGNOSIS — R945 Abnormal results of liver function studies: Secondary | ICD-10-CM | POA: Diagnosis not present

## 2018-06-27 DIAGNOSIS — Z7952 Long term (current) use of systemic steroids: Secondary | ICD-10-CM | POA: Diagnosis not present

## 2018-06-27 DIAGNOSIS — Z7951 Long term (current) use of inhaled steroids: Secondary | ICD-10-CM | POA: Diagnosis not present

## 2018-06-27 DIAGNOSIS — Z433 Encounter for attention to colostomy: Secondary | ICD-10-CM | POA: Diagnosis not present

## 2018-06-27 DIAGNOSIS — Z48815 Encounter for surgical aftercare following surgery on the digestive system: Secondary | ICD-10-CM | POA: Diagnosis not present

## 2018-06-30 DIAGNOSIS — R109 Unspecified abdominal pain: Secondary | ICD-10-CM | POA: Diagnosis not present

## 2018-06-30 NOTE — Telephone Encounter (Signed)
Left message to call back  

## 2018-07-01 ENCOUNTER — Ambulatory Visit
Admission: RE | Admit: 2018-07-01 | Discharge: 2018-07-01 | Disposition: A | Discharge status: Home / Self Care | Payer: PPO | Source: Ambulatory Visit | Attending: General Surgery | Admitting: General Surgery

## 2018-07-01 ENCOUNTER — Other Ambulatory Visit: Payer: Self-pay | Admitting: General Surgery

## 2018-07-01 DIAGNOSIS — Z7951 Long term (current) use of inhaled steroids: Secondary | ICD-10-CM | POA: Diagnosis not present

## 2018-07-01 DIAGNOSIS — R109 Unspecified abdominal pain: Secondary | ICD-10-CM

## 2018-07-01 DIAGNOSIS — Z48815 Encounter for surgical aftercare following surgery on the digestive system: Secondary | ICD-10-CM | POA: Diagnosis not present

## 2018-07-01 DIAGNOSIS — K4090 Unilateral inguinal hernia, without obstruction or gangrene, not specified as recurrent: Secondary | ICD-10-CM | POA: Diagnosis not present

## 2018-07-01 DIAGNOSIS — R945 Abnormal results of liver function studies: Secondary | ICD-10-CM | POA: Diagnosis not present

## 2018-07-01 DIAGNOSIS — Z7952 Long term (current) use of systemic steroids: Secondary | ICD-10-CM | POA: Diagnosis not present

## 2018-07-01 DIAGNOSIS — Z433 Encounter for attention to colostomy: Secondary | ICD-10-CM | POA: Diagnosis not present

## 2018-07-01 MED ORDER — IOPAMIDOL (ISOVUE-300) INJECTION 61%
100.0000 mL | Freq: Once | INTRAVENOUS | Status: AC | PRN
  Administered 2018-07-01: 100 mL via INTRAVENOUS

## 2018-07-02 DIAGNOSIS — Z7951 Long term (current) use of inhaled steroids: Secondary | ICD-10-CM | POA: Diagnosis not present

## 2018-07-02 DIAGNOSIS — Z48815 Encounter for surgical aftercare following surgery on the digestive system: Secondary | ICD-10-CM | POA: Diagnosis not present

## 2018-07-02 DIAGNOSIS — R945 Abnormal results of liver function studies: Secondary | ICD-10-CM | POA: Diagnosis not present

## 2018-07-02 DIAGNOSIS — Z433 Encounter for attention to colostomy: Secondary | ICD-10-CM | POA: Diagnosis not present

## 2018-07-02 DIAGNOSIS — Z7952 Long term (current) use of systemic steroids: Secondary | ICD-10-CM | POA: Diagnosis not present

## 2018-07-03 NOTE — Telephone Encounter (Signed)
Called and spoke with Patient. He stated that he was recently d/c from the hospital with colon perforation.  He stated that his [place] told him not to start back on cellcept or Ofev, because of a bleeding risk.  He is wanting to know when Dr Lake Bells would like him to start back on medications.  Patient is scheduled for OV 07/22/18, with Dr Lake Bells.  Message routed to Dr Lake Bells

## 2018-07-04 NOTE — Telephone Encounter (Signed)
Would have him start back on 3/1, then when I see him we will know how he is doing

## 2018-07-07 ENCOUNTER — Telehealth: Payer: Self-pay | Admitting: Pulmonary Disease

## 2018-07-07 NOTE — Telephone Encounter (Signed)
I called [facility] and left a detailed message requesting a call back. Will hold in triage to follow up.

## 2018-07-07 NOTE — Telephone Encounter (Signed)
Called and spoke with pt letting him know that BQ said to start meds back on 07/13/2018 and then he would reassess at next OV. Pt expressed understanding. Nothing further needed.

## 2018-07-08 NOTE — Telephone Encounter (Signed)
Anderson Malta from [facility] [facility] returning phone call.  Phone number is [phone] option 3.

## 2018-07-08 NOTE — Telephone Encounter (Signed)
Spoke with the pharmacist and she stated that they had to change manufacturer for the mycophenolate. Nothing further is needed.

## 2018-07-16 DIAGNOSIS — Z933 Colostomy status: Secondary | ICD-10-CM | POA: Diagnosis not present

## 2018-07-22 ENCOUNTER — Ambulatory Visit (INDEPENDENT_AMBULATORY_CARE_PROVIDER_SITE_OTHER): Payer: PPO | Admitting: Pulmonary Disease

## 2018-07-22 ENCOUNTER — Encounter: Payer: Self-pay | Admitting: Pulmonary Disease

## 2018-07-22 VITALS — BP 116/64 | HR 91 | Ht 69.0 in | Wt 172.6 lb

## 2018-07-22 DIAGNOSIS — J45909 Unspecified asthma, uncomplicated: Secondary | ICD-10-CM | POA: Diagnosis not present

## 2018-07-22 DIAGNOSIS — J849 Interstitial pulmonary disease, unspecified: Secondary | ICD-10-CM

## 2018-07-22 DIAGNOSIS — M353 Polymyalgia rheumatica: Secondary | ICD-10-CM | POA: Diagnosis not present

## 2018-07-22 DIAGNOSIS — R197 Diarrhea, unspecified: Secondary | ICD-10-CM

## 2018-07-22 NOTE — Progress Notes (Signed)
Subjective:    [name], male    DOB: [birth year], 73 y.o.   MRN: [id number]  Synopsis: Referred in November 2017 for evaluation of shortness of breath.  Found to have diffuse parenchymal lung disease of undetermined cause. An open lung biopsy performed on 02/19/2018 showed an early fibrotic process suggestive of UIP.  He only smoked cigarettes for about 3 to 6 months when he was a teenager.  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   HPI Chief Complaint  Patient presents with  . Follow-up    breathing stable about the same but overall feels better.   [name] has returned to clinic today to see me.  He recently was hospitalized for diverticulitis, perforated, requiring emergent surgery. He says that his wound is slowly healing. He resumed CellCept and prednisone per my recommendation. His wife says that his wound is healing fairly well.  His shortness of breath is at baseline.  No recent pneumonia or bronchitis.  He is taking his nintedanib.  Past Medical History:  Diagnosis Date  . Arthritis   . Chest pain    a. 03/2017: echo showing EF of 60-65%, no regional WMA or significant valve abnormalities. b. 03/2016: NST with no evidence of ischemia.   . Digestive problems    on Prednisone prn for this issue- diarrhea  . Dyspnea    with activity  . Dysrhythmia    irregular due to Myocarditis- takes Atenolol, Norvasc  . GERD (gastroesophageal reflux disease)   . H/O viral myocarditis    25 years ago  . Head injury, closed, with concussion    Breif LOC  . Hyperlipidemia   . Mild mitral valve prolapse    per Dr [name] notes  . PAF (paroxysmal atrial fibrillation) ([place])    a. initially occuring in 02/2016. b. recurrent in 07/2016. Placed on Eliquis  . Polymyalgia rheumatica ([place])   . Pre-diabetes   . Sleep apnea    mild sleep apnea   no CPAP     Review of Systems    Constitutional: Positive for fatigue. Negative for chills, diaphoresis and fever.  HENT: Negative for rhinorrhea, sinus pressure and sinus pain.   Respiratory: Positive for shortness of breath. Negative for cough and wheezing.   Cardiovascular: Negative for chest pain and leg swelling.       Objective:   Physical Exam  Vitals:   07/22/18 1529  BP: 116/64  Pulse: 91  SpO2: 95%  Weight: 172 lb 9.6 oz (78.3 kg)  Height: 5\' 9"  (1.753 m)  RA   Gen: well appearing HENT: OP clear, TM's clear, neck supple PULM: Crackles bases bilaterally, normal percussion CV: RRR, no mgr, trace edema GI: BS+, soft, nontender Derm: no cyanosis or rash Psyche: normal mood and affect    Imaging: November 2017 high-resolution CT scan of the chest images personally reviewed showing patchy groundglass bilaterally which appears predominantly to be patchy lower lobe atelectasis  Barium swallow from December 2017 no evidence of esophageal disorder. January 2018 high-resolution CT scan of the chest showed mild basilar predominant subpleural reticulation and groundglass suggesting nonspecific interstitial pneumonitis. (images independently reviewed 07/22/2018) July 2019 HRCT> images independently reviewed showing fibrotic changes in the bases of the lungs in a nonspecific pattern, not consistent with UIP.  Records from her visit earlier this month with our nurse practitioner reviewed where he was seen for shortness of breath.  Prednisone and a rescue inhaler were prescribed  Blood work: November 2017 reviewed: CRP 139, total CK normal, antineutrophil cytoplasmic antibody testing negative  October 2019 alpha-1 antitrypsin M-M  Path: October 2019 open lung biopsy showed early fibrosis, the fibrosis has temporal heterogeneity there are fibroblastic foci and lymphoid aggregates, no honeycombing identified.  Pathology feels suggestive of early UIP but not definitive; second opinion sent to [facility], Dr. Reggie Roberts:  Chronic fibrosing interstitial pneumonia unclassifiable pattern.  Temporal and spatially variegated pattern of fibrosing interstitial pneumonia with abundant spared alveolar parenchyma.  Prominent fibroblast foci.  No honeycombing.  Records of imaging was reviewed as well as the patient's history, they did query the possibility of an association with an underlying systemic inflammatory disorder or connective tissue disease.  No findings of aspiration.  Pulmonary function testing 04/2016 Ratio 83%, FEV1 3.02 L 93%, FVC 3.06 L , total lung capacity 5. 12/17/1982 percent protected, DLCO 23.9 75% predicted  August 2019 pulmonary function testing ratio normal, FVC 3.53 L 83% predicted, total lung capacity 6.25 L 106% predicted, residual volume 155% predicted, DLCO 21.2 mL 68% predicted  Records from his hospitalization in February 2020 reviewed where he was treated with [facility] procedure, colectomy, colostomy after he had acute sigmoid diverticulitis with perforation.    Assessment & Plan:   ILD (interstitial lung disease) ([place])  PMR (polymyalgia rheumatica) (HCC)  Uncomplicated asthma, unspecified asthma severity, unspecified whether persistent  Diarrhea, unspecified type  Discussion: I am concerned about [name] and his wound healing however he tells me that his surgeon says that it is okay for him to be on CellCept and prednisone.  I will try to reach out to the surgery team to make sure this is in fact the case.  I do want him to be on his immunosuppressant drugs however because I think that is the best bet for him to prevent complications from his fibrosing nonspecific interstitial pneumonitis.  Plan: Fibrosing nonspecific interstitial pneumonitis: Continue CellCept Continue prednisone Continue Bactrim Continue nintedanib I will reach out to your surgery team to make sure that they are okay with you being on these medicines We will need to get another lung function test later this year  but I want your wound to heal first Practice good hand hygiene Stay physically active as best as possible with your surgery  We will see you back in 2 months or sooner if needed  Greater than 50% of this 25-minute visit was spent face-to-face     Current Outpatient Medications:  .  acetaminophen (TYLENOL) 500 MG tablet, Take 1,000 mg by mouth every 6 (six) hours as needed for mild pain or headache. , Disp: , Rfl:  .  albuterol (PROVENTIL HFA;VENTOLIN HFA) 108 (90 Base) MCG/ACT inhaler, Inhale 2 puffs into the lungs every 4 (four) hours as needed for wheezing or shortness of breath. , Disp: , Rfl:  .  budesonide-formoterol (SYMBICORT) 80-4.5 MCG/ACT inhaler, Inhale 2 puffs into the lungs 2 (two) times daily., Disp: 1 Inhaler, Rfl: 12 .  cetirizine (ZYRTEC) 10 MG tablet, Take 10 mg by mouth daily as needed for allergies. , Disp: , Rfl:  .  diltiazem (CARDIZEM CD) 120 MG 24 hr capsule, Take 1 capsule (120 mg total) by mouth daily., Disp: 30 capsule, Rfl: 3 .  ELIQUIS 5 MG TABS tablet, TAKE ONE TABLET (5MG  TOTAL) BY MOUTH TWODAILY, Disp: 180 tablet, Rfl: 1 .  ezetimibe (ZETIA) 10 MG tablet, TAKE ONE (1) TABLET BY MOUTH EVERY DAY, Disp: 90 tablet, Rfl:  3 .  fluticasone (FLONASE) 50 MCG/ACT nasal spray, Place 2 sprays into both nostrils daily as needed for allergies or rhinitis., Disp: , Rfl:  .  gabapentin (NEURONTIN) 300 MG capsule, Take 300 mg by mouth at bedtime. , Disp: , Rfl:  .  metoprolol tartrate (LOPRESSOR) 25 MG tablet, TAKE ONE TABLET BY MOUTH TWICE A DAY, Disp: 180 tablet, Rfl: 3 .  mycophenolate (CELLCEPT) 500 MG tablet, , Disp: , Rfl:  .  Neomycin-Bacitracin-Polymyxin (TRIPLE ANTIBIOTIC) [phone] OINT, Apply 1 application topically daily., Disp: , Rfl:  .  Omega-3 Fatty Acids (FISH OIL) 500 MG CAPS, Take 1 capsule by mouth 2 (two) times daily., Disp: , Rfl:  .  predniSONE (DELTASONE) 10 MG tablet, Take 1 tablet (10 mg total) by mouth daily with breakfast., Disp: 30 tablet, Rfl:  5 .  sulfamethoxazole-trimethoprim (BACTRIM DS,SEPTRA DS) 800-160 MG tablet, , Disp: , Rfl:  .  tamsulosin (FLOMAX) 0.4 MG CAPS capsule, Take 0.4 mg by mouth 2 (two) times daily. , Disp: , Rfl:  .  traMADol (ULTRAM) 50 MG tablet, Take 50 mg by mouth every 6 (six) hours as needed for moderate pain or severe pain. , Disp: , Rfl:

## 2018-07-22 NOTE — Patient Instructions (Signed)
Fibrosing nonspecific interstitial pneumonitis: Continue CellCept Continue prednisone Continue Bactrim Continue nintedanib I will reach out to your surgery team to make sure that they are okay with you being on these medicines We will need to get another lung function test later this year but I want your wound to heal first Practice good hand hygiene Stay physically active as best as possible with your surgery  We will see you back in 2 months or sooner if needed

## 2018-07-28 DIAGNOSIS — Z48815 Encounter for surgical aftercare following surgery on the digestive system: Secondary | ICD-10-CM | POA: Diagnosis not present

## 2018-07-28 DIAGNOSIS — D692 Other nonthrombocytopenic purpura: Secondary | ICD-10-CM | POA: Diagnosis not present

## 2018-07-28 DIAGNOSIS — K5780 Diverticulitis of intestine, part unspecified, with perforation and abscess without bleeding: Secondary | ICD-10-CM | POA: Diagnosis not present

## 2018-07-28 DIAGNOSIS — M353 Polymyalgia rheumatica: Secondary | ICD-10-CM | POA: Diagnosis not present

## 2018-07-28 DIAGNOSIS — I739 Peripheral vascular disease, unspecified: Secondary | ICD-10-CM | POA: Diagnosis not present

## 2018-07-28 DIAGNOSIS — J849 Interstitial pulmonary disease, unspecified: Secondary | ICD-10-CM | POA: Diagnosis not present

## 2018-07-28 DIAGNOSIS — I482 Chronic atrial fibrillation, unspecified: Secondary | ICD-10-CM | POA: Diagnosis not present

## 2018-07-28 DIAGNOSIS — G4730 Sleep apnea, unspecified: Secondary | ICD-10-CM | POA: Diagnosis not present

## 2018-07-28 DIAGNOSIS — I10 Essential (primary) hypertension: Secondary | ICD-10-CM | POA: Diagnosis not present

## 2018-08-28 DIAGNOSIS — J849 Interstitial pulmonary disease, unspecified: Secondary | ICD-10-CM | POA: Diagnosis not present

## 2018-08-28 DIAGNOSIS — M353 Polymyalgia rheumatica: Secondary | ICD-10-CM | POA: Diagnosis not present

## 2018-08-28 DIAGNOSIS — I739 Peripheral vascular disease, unspecified: Secondary | ICD-10-CM | POA: Diagnosis not present

## 2018-08-28 DIAGNOSIS — G4730 Sleep apnea, unspecified: Secondary | ICD-10-CM | POA: Diagnosis not present

## 2018-08-28 DIAGNOSIS — Z9049 Acquired absence of other specified parts of digestive tract: Secondary | ICD-10-CM | POA: Diagnosis not present

## 2018-08-28 DIAGNOSIS — K5720 Diverticulitis of large intestine with perforation and abscess without bleeding: Secondary | ICD-10-CM | POA: Diagnosis not present

## 2018-08-29 DIAGNOSIS — Z0000 Encounter for general adult medical examination without abnormal findings: Secondary | ICD-10-CM | POA: Diagnosis not present

## 2018-09-04 ENCOUNTER — Telehealth: Payer: Self-pay | Admitting: Pulmonary Disease

## 2018-09-04 DIAGNOSIS — R768 Other specified abnormal immunological findings in serum: Secondary | ICD-10-CM | POA: Diagnosis not present

## 2018-09-04 DIAGNOSIS — M542 Cervicalgia: Secondary | ICD-10-CM | POA: Diagnosis not present

## 2018-09-04 DIAGNOSIS — M353 Polymyalgia rheumatica: Secondary | ICD-10-CM | POA: Diagnosis not present

## 2018-09-04 DIAGNOSIS — M25511 Pain in right shoulder: Secondary | ICD-10-CM | POA: Diagnosis not present

## 2018-09-04 DIAGNOSIS — J84112 Idiopathic pulmonary fibrosis: Secondary | ICD-10-CM | POA: Diagnosis not present

## 2018-09-04 NOTE — Telephone Encounter (Signed)
Unable to locate PA forms at this time. All folders empty. Need follow up to see if forms arrived here.

## 2018-09-04 NOTE — Telephone Encounter (Signed)
ATC Allison back at [phone] regarding needed information for Mycophenolate.  She was unavailable.  Spoke with Melissa who stated it seems a PA request was sent to [place] on 09/02/18 and needed our attention.  Will need to check for PA forms.  Lenna Sciara will leave a message for Ebony Hail to call us back.  In the meantime will check to see if new forms are here.

## 2018-09-05 NOTE — Telephone Encounter (Signed)
Call made to [facility], spoke with Anderson Malta, made aware forms have not been located and to re-fax forms. Will send message to nurse to hold until forms are received.

## 2018-09-08 NOTE — Telephone Encounter (Signed)
Still have not receive forms.   Routing back to triage as this message originated there.

## 2018-09-08 NOTE — Telephone Encounter (Signed)
Checked the PA folder and the fax was received by [place]. PA forms have been filled out and will fax back to [facility]. Routing to [employer] to follow up on once response on PA has been received.

## 2018-09-09 NOTE — Telephone Encounter (Signed)
Call made to [facility]. PA is pending.

## 2018-09-10 NOTE — Telephone Encounter (Signed)
Received a fax on 09/09/2018 at 10:44 stating that a PA approval is on file and is covered through 05/14/2019.  Called [facility] to verify this.  Spoke with [facility] who stated that she could not locate an approval for this medication.  Was then transferred to Audrea Muscat, who looked further into this.  Per Audrea Muscat, there are 2 PA's on file: 1 regular PA was denied d/t part D coverage.  An appeal was approved with coverage dates from 06/02/2018-05/14/2019.  NDC#: [id number].  Audrea Muscat verified that the rx is being ran through insurance with coverage.    Nothing further needed at this time- will close encounter.

## 2018-09-17 ENCOUNTER — Ambulatory Visit: Payer: PPO | Admitting: Pulmonary Disease

## 2018-09-23 ENCOUNTER — Telehealth: Payer: Self-pay | Admitting: Cardiovascular Disease

## 2018-09-23 NOTE — Telephone Encounter (Signed)
Spoke to pt who stated the number listed is a smartphone and he can do video visit with Dr. Claiborne Billings tomorrow. He stated that his reception is not great, but willing to try it. He also stated he does have a BP cuff and weight scale at home.

## 2018-09-24 ENCOUNTER — Telehealth: Payer: Self-pay | Admitting: Cardiovascular Disease

## 2018-09-24 ENCOUNTER — Telehealth (INDEPENDENT_AMBULATORY_CARE_PROVIDER_SITE_OTHER): Payer: PPO | Admitting: Cardiovascular Disease

## 2018-09-24 ENCOUNTER — Encounter: Payer: Self-pay | Admitting: Cardiovascular Disease

## 2018-09-24 VITALS — BP 125/73 | HR 100 | Ht 69.0 in | Wt 170.0 lb

## 2018-09-24 DIAGNOSIS — I480 Paroxysmal atrial fibrillation: Secondary | ICD-10-CM | POA: Diagnosis not present

## 2018-09-24 DIAGNOSIS — Z8679 Personal history of other diseases of the circulatory system: Secondary | ICD-10-CM | POA: Diagnosis not present

## 2018-09-24 DIAGNOSIS — J849 Interstitial pulmonary disease, unspecified: Secondary | ICD-10-CM | POA: Diagnosis not present

## 2018-09-24 DIAGNOSIS — D7589 Other specified diseases of blood and blood-forming organs: Secondary | ICD-10-CM | POA: Diagnosis not present

## 2018-09-24 DIAGNOSIS — R9431 Abnormal electrocardiogram [ECG] [EKG]: Secondary | ICD-10-CM | POA: Diagnosis not present

## 2018-09-24 DIAGNOSIS — K5720 Diverticulitis of large intestine with perforation and abscess without bleeding: Secondary | ICD-10-CM

## 2018-09-24 DIAGNOSIS — E782 Mixed hyperlipidemia: Secondary | ICD-10-CM | POA: Diagnosis not present

## 2018-09-24 NOTE — Patient Instructions (Signed)
Medication Instructions:  The current medical regimen is effective;  continue present plan and medications.  If you need a refill on your cardiac medications before your next appointment, please call your pharmacy.   Follow-Up: At [facility], you and your health needs are our priority.  As part of our continuing mission to provide you with exceptional heart care, we have created designated Provider Care Teams.  These Care Teams include your primary Cardiologist (physician) and Advanced Practice Providers (APPs -  Physician Assistants and Nurse Practitioners) who all work together to provide you with the care you need, when you need it. . Follow up with Dr.Kelly in 1 month (in office)

## 2018-09-24 NOTE — Telephone Encounter (Signed)
LVM for patient to call and schedule his next appt with Dr. Claiborne Billings on 10-13-18 at 11 a.m. per Dr. Claiborne Billings.

## 2018-09-24 NOTE — Progress Notes (Signed)
Virtual Visit via Video Note   This visit type was conducted due to national recommendations for restrictions regarding the COVID-19 Pandemic (e.g. social distancing) in an effort to limit this patient's exposure and mitigate transmission in our community.  Due to his co-morbid illnesses, this patient is at least at moderate risk for complications without adequate follow up.  This format is felt to be most appropriate for this patient at this time.  All issues noted in this document were discussed and addressed.  A limited physical exam was performed with this format.  Please refer to the patient's chart for his consent to telehealth for [facility].   Date:  09/24/2018   ID:  [name], DOB Nov 24, 1945, MRN [id number]  Patient Location: Home Provider Location: Office  PCP:  [name], MD  Cardiologist:  Shelva Majestic, MD  Electrophysiologist:  None   Evaluation Performed:  Follow-Up Visit  Chief Complaint:  6 month F/U  History of Present Illness:    [name] is a 73 y.o. male who has remote history of viral myocarditis in 1989 at which time his ejection fraction was 25%. He subsequently normalized LV function. He has a history of mild mitral valve prolapse. Cardiac catheterization in 2001 showed mild mid systolic LAD bridging. He has been noted to have mild T-wave changes on his ECG. Additional problems include hypertension as well as hyperlipidemia. He was unable to tolerate Lipitor. He initially tolerated Crestor but then developed significant myalgias.  He has been able to tolerate Zetia 10 mg daily.   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  Apparently, this has stabilized.  He is in the Engineer, maintenance business.  He denies any episodes of chest pain, or change in exercise tolerance.  On 02/01/2015 he underwent lumbar surgery by [name] involving L2-L3, L3-L4, and L4-L5.  He tolerated surgery  without cardiovascular compromise.  When I saw him in January 2017 he was on amlodipine 2.5 mg and atenolol 25 mg twice a day, which has been helpful for his blood pressure, palpitations, as well as documented systolic LAD muscle bridging.    At subsequent office visit follow-up November 2017 he had developed progressive shortness of breath with activity.  He denied any significant chest pain.  He has seen [name], [facility] and when he was last seen by him one week ago I  went over his symptom complex and recommended a workup strategy.  He underwent an echo Doppler study which was done on 03/22/2016 and this showed normal systolic function with an EF of 60-65% with grade 1 diastolic dysfunction.  His aorta was upper normal to mildly dilated at 38 mm.  There was no significant valvular pathology.  There was no TR Doppler jets an estimated PA systolic pressure was not able to be obtained.  Due to his shortness of breath.  He also underwent a CT of his chest.  He had dependent linear and platelike atelectasis in lower lungs bilaterally.  The patient has a remote exposure to asbestosis in his 20s, but his CT scan did not reveal any evidence for pleural based calcification.  There were diffuse atelectatic changes in the lungs bilaterally.  He also underwent a nuclear perfusion study which showed normal perfusion and function without scar or ischemia.  I had sent off laboratory was notable for a normal BMP 53.  Troponins were negative.  He had been  prescribed levaquin by his primary physician as empiric therapy for a mild fever.  His fever resolved but he continues to experience shortness of breath.  His white blood count 13 days ago was 11.2.  He was macrocytic with an MCV of 102 with a normal hemoglobin at 16.2, hematocrit of 46.0.  B12 and folate levels were normal.  An erythrocyte sedimentation rate was increased at 61 and a high-sensitivity C-reactive protein was significantly increased at 139.    I referred him  to [name] for pulmonary evaluation for possible interstitial lung disease and also [name] for rheumatologic follow-up.  He was given increased steroids.  In November 2017.  A high resolution CT scan of the chest showed patchy groundglass bilaterally andpatchy lower lobe atelectasis.  A barium swallow did not show evidence for esophageal disorder.  Antineutrophil cytoplasmic antibody testing was negative.  A follow-up high-resolution CT was done on June 12, 2016, which showed mild basilar predominant subpleural reticulation and groundglass suggesting nonspecific interstitial pneumonitis, however, will interstitial pneumonitis could not be excluded.  He was also noted to have aortic atherosclerosis and a punctate stone in his left kidney.  Seen in 2018 he was improved. Subsequent laboratory had shown a sedimentation rate which improved from 61 to  1 and CRP which had improved from 139 to 6.8.  His lipids were elevated with a total cholesterol 222, triglycerides 359, HDL 67, VLDL 72, and LDL 83.  He developed myalgias with Crestor and Lipitor.  He denies chest pain. He is concerned about the cost of soe ofhis medicatios, partiyby.  He continues to be on pradaxa for anticoagulation and long-acting Cardizem.  He is unaware of any recent arrhythmia.  He had follow-up blood work on 01/15/2017 by Wende Neighbors in [place].  Hemoglobin 17.5, hematocrit 48.7.  Lipid studies were improved with a total cholesterol 220, triglycerides 175, HDL 72, LDL 113.  Hemoglobin A1c was 5.7.  He is on CPAP therapy.  He nasal pillows, small size, the D[employer] [facility].  He has undergone more extensive pulmonary evaluation for his diffuse parenchymal lung disease of undetermined cause.  The February 19, 2018 he underwent an open lung biopsy by Dr. Roxan Hockey and there was some suggestion of possible early fibrotic process suggestive of UIP.  Subsequently, his biopsy was evaluated at [facility] by Dr. Reggie Pile.  He was  felt to have chronic fibrosing interstitial pneumonia with an unclassifiable pattern.  They did query the possibility of association with an underlying systemic inflammatory or connective tissue disorder.  They did not believe he had UIP. He has also undergone follow-up rheumatologic evaluation.  He was started on a trial of Ofev.  When I last saw him in December 2019 he denied any chest pain or palpitations, PND orthopnea,  presyncope or syncope.  He was not short of breath at rest but does get short of breath with mild activity.  He was on chronic steroids and inhalers and was on Cellcept and Ofev, followed by Dr Lake Bells.  He was  admitted to the hospital on 06/15/2018 with acute diverticulitis requiring exploratory laparotomy and colostomy. The pt was taken off Cellcept and Ofev on admission  Postoperatively his QTC was noted to be prolonged- his QTC on 06/24/2018 was 624. Antibiotics were adjusted.  He saw Kerin Ransom, [facility]  for further evaluation on 06/25/2018. and f/u EKG.  His QTC  in the office was 447. He is slowly improving.  No palpitations or tachycardia, no unusual dyspnea.  He  has noted his B/P has been running [id number]'I systolic.  He tells me that he will be in need for reversal of his colostomy with colostomy takedown and sometime this year will also require back surgery by Dr. Ellene Route.  His lung disease has improved with reinstitution of CellCept.  He denies any chest pain PND orthopnea.  He denies any palpitations.   The patient does not have symptoms concerning for COVID-19 infection (fever, chills, cough, or new shortness of breath).    Past Medical History:  Diagnosis Date   Arthritis    Chest pain    a. 03/2017: echo showing EF of 60-65%, no regional WMA or significant valve abnormalities. b. 03/2016: NST with no evidence of ischemia.    Digestive problems    on Prednisone prn for this issue- diarrhea   Dyspnea    with activity   Dysrhythmia    irregular due to  Myocarditis- takes Atenolol, Norvasc   GERD (gastroesophageal reflux disease)    H/O viral myocarditis    25 years ago   Head injury, closed, with concussion    Breif LOC   Hyperlipidemia    Mild mitral valve prolapse    per Dr Evette Georges notes   PAF (paroxysmal atrial fibrillation) ([place])    a. initially occuring in 02/2016. b. recurrent in 07/2016. Placed on Eliquis   Polymyalgia rheumatica ([place])    Pre-diabetes    Sleep apnea    mild sleep apnea   no CPAP   Past Surgical History:  Procedure Laterality Date   apendectomy  1965   APPENDECTOMY     bone spur Bilateral 1999   feet   CARDIAC CATHETERIZATION  2001   CHOLECYSTECTOMY  2004   COLON RESECTION N/A 06/15/2018   Procedure: SIGMOID COLON RESECTION;  Surgeon: [name], MD;  Location: [facility];  Service: General;  Laterality: N/A;   COLONOSCOPY     COLOSTOMY N/A 06/15/2018   Procedure: COLOSTOMY;  Surgeon: [name], MD;  Location: [facility];  Service: General;  Laterality: N/A;   EYE SURGERY Bilateral    cataract removal   LAPAROTOMY N/A 06/15/2018   Procedure: EXPLORATORY LAPAROTOMY;  Surgeon: [name], MD;  Location: [facility];  Service: General;  Laterality: N/A;   LUMBAR LAMINECTOMY/ DECOMPRESSION WITH MET-RX Right 05/05/2013   Procedure: Right Lumbar three-four Extraforaminal Microdiskectomy with Metrex;  Surgeon: [name], MD;  Location: [facility];  Service: Neurosurgery;  Laterality: Right;  Right Lumbar three-four Extraforaminal Microdiskectomy with Metrex   LUNG BIOPSY Right 02/19/2018   Procedure: LUNG BIOPSY;  Surgeon: [name], MD;  Location: [facility];  Service: Thoracic;  Laterality: Right;   SHOULDER OPEN ROTATOR CUFF REPAIR Bilateral 2001   TONSILLECTOMY     VIDEO ASSISTED THORACOSCOPY Right 02/19/2018   Procedure: VIDEO ASSISTED THORACOSCOPY;  Surgeon: [name], MD;  Location: [place];  Service: Thoracic;  Laterality: Right;     Current Meds  Medication  Sig   acetaminophen (TYLENOL) 500 MG tablet Take 1,000 mg by mouth every 6 (six) hours as needed for mild pain or headache.    albuterol (PROVENTIL HFA;VENTOLIN HFA) 108 (90 Base) MCG/ACT inhaler Inhale 2 puffs into the lungs every 4 (four) hours as needed for wheezing or shortness of breath.    budesonide-formoterol (SYMBICORT) 80-4.5 MCG/ACT inhaler Inhale 2 puffs into the lungs 2 (two) times daily.   cetirizine (ZYRTEC) 10 MG tablet Take 10 mg by mouth daily as needed for allergies.  diltiazem (CARDIZEM CD) 120 MG 24 hr capsule Take 1 capsule (120 mg total) by mouth daily.   ELIQUIS 5 MG TABS tablet TAKE ONE TABLET ('5MG'$  TOTAL) BY MOUTH TWODAILY   ezetimibe (ZETIA) 10 MG tablet TAKE ONE (1) TABLET BY MOUTH EVERY DAY   fluticasone (FLONASE) 50 MCG/ACT nasal spray Place 2 sprays into both nostrils daily as needed for allergies or rhinitis.   gabapentin (NEURONTIN) 300 MG capsule Take 300 mg by mouth at bedtime.    metoprolol tartrate (LOPRESSOR) 25 MG tablet TAKE ONE TABLET BY MOUTH TWICE A DAY   mycophenolate (CELLCEPT) 500 MG tablet Take 500 mg by mouth 2 (two) times daily.    Omega-3 Fatty Acids (FISH OIL) 500 MG CAPS Take 1 capsule by mouth 2 (two) times daily.   ondansetron (ZOFRAN) 4 MG tablet Take 4 mg by mouth every 8 (eight) hours as needed for nausea or vomiting.   predniSONE (DELTASONE) 10 MG tablet Take 1 tablet (10 mg total) by mouth daily with breakfast.   sulfamethoxazole-trimethoprim (BACTRIM DS,SEPTRA DS) 800-160 MG tablet    tamsulosin (FLOMAX) 0.4 MG CAPS capsule Take 0.4 mg by mouth 2 (two) times daily.    traMADol (ULTRAM) 50 MG tablet Take 50 mg by mouth every 6 (six) hours as needed for moderate pain or severe pain.      Allergies:   Lidocaine hcl; Xylocaine [lidocaine]; Codeine; Crestor [rosuvastatin calcium]; Statins; and Percocet [oxycodone-acetaminophen]   Social History   Tobacco Use   Smoking status: Never Smoker   Smokeless tobacco: Never  Used  Substance Use Topics   Alcohol use: Yes    Alcohol/week: 21.0 standard drinks    Types: 21 Glasses of wine per week    Comment: 2/3 glasses wine in evening   Drug use: No     Family Hx: The patient's family history includes Asthma in his father; Heart disease in his father; Melanoma in his brother; Rheum arthritis in his mother; Thyroid cancer in his mother and sister.  ROS:   Please see the history of present illness.    No fevers chills night sweats Breathing improved with reinstitution of CellCept Recent spontaneous perforated sigmoid colon, status post Hartmann colectomy/colostomy Interstitial lung disease Neck back pain All other systems reviewed and are negative.   Prior CV studies:   The following studies were reviewed today:  ------------------------------------------------------------------- ECHO Study Conclusions: 03/2016  - Left ventricle: The cavity size was normal. Wall thickness was   normal. Systolic function was normal. The estimated ejection   fraction was in the range of 60% to 65%. Wall motion was normal;   there were no regional wall motion abnormalities. Doppler   parameters are consistent with abnormal left ventricular   relaxation (grade 1 diastolic dysfunction). - Aortic valve: There was no stenosis. - Aorta: Mildly dilated aortic root. Aortic root dimension: 38 mm   (ED). - Mitral valve: There was no significant regurgitation. - Right ventricle: The cavity size was normal. Systolic function   was normal. - Pulmonary arteries: No complete TR doppler jet so unable to   estimate PA systolic pressure. - Inferior vena cava: The vessel was normal in size. The   respirophasic diameter changes were in the normal range (>= 50%),   consistent with normal central venous pressure.  Impressions:  - Normal LV size with EF 60-65%. Normal RV size and systolic   function. No significant valvular abnormalities.   Since his last evaluation with  me, I reviewed his hospital records  guarding his perforated sigmoid colon.  I have reviewed the recent pulmonary evaluation with [name]  Labs/Other Tests and Data Reviewed:    EKG:  An ECG dated 06/25/2018 was personally reviewed today and demonstrated:  Normal sinus rhythm at 79 bpm.  QTc interval 447 ms  Recent Labs: 06/23/2018: Magnesium 2.3 06/24/2018: ALT 30; BUN 9; Creatinine, Ser 0.77; Hemoglobin 12.6; Platelets 353; Potassium 3.9; Sodium 138   Recent Lipid Panel Lab Results  Component Value Date/Time   CHOL 222 (H) 07/16/2016 09:02 AM   TRIG 359 (H) 07/16/2016 09:02 AM   HDL 67 07/16/2016 09:02 AM   CHOLHDL 3.3 07/16/2016 09:02 AM   LDLCALC 83 07/16/2016 09:02 AM    Wt Readings from Last 3 Encounters:  09/24/18 170 lb (77.1 kg)  07/22/18 172 lb 9.6 oz (78.3 kg)  06/25/18 168 lb 3.2 oz (76.3 kg)     Objective:    Vital Signs:  BP 125/73    Pulse 100 Comment: Pt was unloading trailer   Ht _0  (1.753 m)    Wt 170 lb (77.1 kg)    SpO2 95%    BMI 25.10 kg/m    Appears well-developed and well-nourished in no acute distress. Breathing is normal and not labored No wheezing No chest discomfort to palpation Colostomy in place No leg swelling No new neurologic symptoms Normal cognition, mood and affect  ASSESSMENT & PLAN:    1. CAD: Mild mid systolic LAD bridging noted at catheterization in 2001.  No anginal symptoms.  Tolerated recent emergent bowel surgery without cardiovascular issue 2. Long QT interval, resolved 06/25/18 ECG sinus rhythm at 79 bpm with QTc interval 447          3. Fibrosing nonspecific interstitial pneumonitis followed by [name], back on CellCept  4. Spontaneous sigmoid colon perforation requiring emergent surgery with subsequent peritonitis.  Will ultimately need a reversal of his colostomy.  I have recommended that I see him in the office and ECG be obtained prior to scheduling the elective surgery which may not take place until sometime in  June or later. 5. Remote history of viral myocarditis EF of 20 to 25% in 1988, most recent echo in 2017 EF 60 to 65%. 6. PAF: Maintaining sinus rhythm 7. Echo cytosis: When hospitalized MCV was 110 with hemoglobin 12.6 hematocrit 38.2.  Primary MD is Dr. Wende Neighbors.  Consider checking B12, folate levels 8. Mixed Hyperlipidemia: We will need to obtain labs from Dr. Wende Neighbors; currently on Zetia, previous intolerance to statin.  May benefit from omega-3 fatty acid triglycerides remain elevated.  COVID-19 Education: The signs and symptoms of COVID-19 were discussed with the patient and how to seek care for testing (follow up with PCP or arrange E-visit).  The importance of social distancing was discussed today.  Time:   Today, I have spent 83mnutes with the patient with telehealth technology discussing the above problems.     Medication Adjustments/Labs and Tests Ordered: Current medicines are reviewed at length with the patient today.  Concerns regarding medicines are outlined above.   Tests Ordered: No orders of the defined types were placed in this encounter.   Medication Changes: No orders of the defined types were placed in this encounter.   Disposition:  Follow up office follow-up prior to any planned elective surgery for colostomy reversal or his future back surgery.  Signed, TShelva Majestic MD  09/24/2018 1:17 PM    [place]

## 2018-09-24 NOTE — Telephone Encounter (Signed)
error 

## 2018-10-03 ENCOUNTER — Telehealth: Payer: Self-pay | Admitting: Cardiovascular Disease

## 2018-10-03 ENCOUNTER — Telehealth: Payer: Self-pay | Admitting: Pulmonary Disease

## 2018-10-03 DIAGNOSIS — G4733 Obstructive sleep apnea (adult) (pediatric): Secondary | ICD-10-CM | POA: Diagnosis not present

## 2018-10-03 DIAGNOSIS — R05 Cough: Secondary | ICD-10-CM | POA: Diagnosis not present

## 2018-10-03 MED ORDER — SULFAMETHOXAZOLE-TRIMETHOPRIM 800-160 MG PO TABS: ORAL_TABLET | ORAL | 5 refills | Status: DC

## 2018-10-03 NOTE — Telephone Encounter (Signed)
Spoke with pt who state Dr. Claiborne Billings recommended his f/u appointment to be scheduled in office as he is needing an EKG. Informed per chart review, f/u appointment has been scheduled for a virtual visit but will send message to MD's nurse to clarify. Pt voiced understanding.

## 2018-10-03 NOTE — Telephone Encounter (Signed)
Instructions  Return in about 2 months (around 09/21/2018).  Fibrosing nonspecific interstitial pneumonitis: Continue CellCept Continue prednisone Continue Bactrim Continue nintedanib I will reach out to your surgery team to make sure that they are okay with you being on these medicines We will need to get another lung function test later this year but I want your wound to heal first Practice good hand hygiene Stay physically active as best as possible with your surgery  We will see you back in 2 months or sooner if needed     Per pt's medlist, pt is on sulfamethoxazole-trimethropin (Bactrim DS) 800-160mg  tablet and is taking it twice daily Mon Wed Friday. Called and spoke with pharmacy letting them know that the Rx was being electronically sent to pharmacy for pt. Nothing further needed.

## 2018-10-03 NOTE — Telephone Encounter (Signed)
Patient is returning call.  °

## 2018-10-03 NOTE — Telephone Encounter (Signed)
[name] is calling because he was told by Dr Claiborne Billings at last virtual visit that he wanted him to come into the office to be seen and have an EKG. His appt is scheduled as a virtual visit and he is confused. He would like to know if he is coming in to be seen or not.

## 2018-10-03 NOTE — Telephone Encounter (Signed)
Dr.Kelly is DOD on June 1st- patient was instructed to come into office to be seen for EKG reading. Changed visit type to office visit instead of virtual visit. Patient notified- no questions at this time.

## 2018-10-03 NOTE — Telephone Encounter (Signed)
Left message to call back  

## 2018-10-09 DIAGNOSIS — E1165 Type 2 diabetes mellitus with hyperglycemia: Secondary | ICD-10-CM | POA: Diagnosis not present

## 2018-10-09 DIAGNOSIS — I10 Essential (primary) hypertension: Secondary | ICD-10-CM | POA: Diagnosis not present

## 2018-10-09 DIAGNOSIS — R7301 Impaired fasting glucose: Secondary | ICD-10-CM | POA: Diagnosis not present

## 2018-10-09 DIAGNOSIS — E782 Mixed hyperlipidemia: Secondary | ICD-10-CM | POA: Diagnosis not present

## 2018-10-13 ENCOUNTER — Encounter: Payer: Self-pay | Admitting: Cardiovascular Disease

## 2018-10-13 ENCOUNTER — Other Ambulatory Visit: Payer: Self-pay

## 2018-10-13 ENCOUNTER — Ambulatory Visit (INDEPENDENT_AMBULATORY_CARE_PROVIDER_SITE_OTHER): Payer: PPO | Admitting: Cardiovascular Disease

## 2018-10-13 VITALS — BP 130/70 | HR 70 | Temp 97.8°F | Ht 69.0 in | Wt 178.0 lb

## 2018-10-13 DIAGNOSIS — K631 Perforation of intestine (nontraumatic): Secondary | ICD-10-CM

## 2018-10-13 DIAGNOSIS — J8489 Other specified interstitial pulmonary diseases: Secondary | ICD-10-CM

## 2018-10-13 DIAGNOSIS — D7589 Other specified diseases of blood and blood-forming organs: Secondary | ICD-10-CM | POA: Diagnosis not present

## 2018-10-13 DIAGNOSIS — I480 Paroxysmal atrial fibrillation: Secondary | ICD-10-CM

## 2018-10-13 DIAGNOSIS — E782 Mixed hyperlipidemia: Secondary | ICD-10-CM

## 2018-10-13 DIAGNOSIS — I2510 Atherosclerotic heart disease of native coronary artery without angina pectoris: Secondary | ICD-10-CM | POA: Diagnosis not present

## 2018-10-13 NOTE — Patient Instructions (Signed)
Medication Instructions:  The current medical regimen is effective;  continue present plan and medications.  If you need a refill on your cardiac medications before your next appointment, please call your pharmacy.   Follow-Up: At [facility] you and your health needs are our priority.  As part of our continuing mission to provide you with exceptional heart care, we have created designated Provider Care Teams.  These Care Teams include your primary Cardiologist (physician) and Advanced Practice Providers (APPs -  Physician Assistants and Nurse Practitioners) who all work together to provide you with the care you need, when you need it. You will need a follow up appointment in 6 months.  Please call our office 2 months in advance to schedule this appointment.  You may see Shelva Majestic, MD or one of the following Advanced Practice Providers on your designated Care Team: [place], [place] . Fabian Sharp, PA-C  Any Other Special Instructions Will Be Listed Below (If Applicable). Hold Eliquis 48 hours prior to your procedure.

## 2018-10-13 NOTE — Progress Notes (Signed)
Patient ID: MORGAN RENNERT, male   DOB: 1946/04/24, 73 y.o.   MRN: [id number]    Primary M.D.: Dr. Wende Neighbors  HPI: Caliber Landess Hurd is a 73 y.o. male who presents for a one month cardiology follow-up evaluation.   Mr. Tomasik has remote history of viral myocarditis in 1989 at which time his ejection fraction was 25%. He subsequently normalized LV function. He has a history of mild mitral valve prolapse. Cardiac catheterization in 2001 showed mild mid systolic LAD bridging. He has been noted to have mild T-wave changes on his ECG. Additional problems include hypertension as well as hyperlipidemia. He was unable to tolerate Lipitor. He initially tolerated Crestor but then developed significant myalgias.  He has been able to tolerate Zetia 10 mg daily.   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  Apparently, this has stabilized and is tired.  Ears have almost normalized.  He has been starting to resume very small amounts of red meat.    Past year, he has remained active.  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  He denies any episodes of chest pain, or change in exercise tolerance.  On 02/01/2015.  He underwent lumbar surgery by [name] involving L2-L3, L3-L4, and L4-L5.  He tolerated surgery without cardiovascular compromise.  When I  saw him in January 2017 he was on amlodipine 2.5 mg and atenolol 25 g twice a day, which has been helpful for his blood pressure, palpitations, as well as documented systolic LAD muscle bridging.    At a subsequent office visit in November 2017 he underwent an echo Doppler study which was done on 03/22/2016 and this showed normal systolic function with an EF of 60-65% with grade 1 diastolic dysfunction.  His aorta was upper normal to mildly dilated at 38 mm.  There was no significant valvular pathology.  There was no TR Doppler jets an estimated PA systolic pressure was not able to be obtained.  Due  to his shortness of breath.  He also underwent a CT of his chest.  He had dependent linear and platelike atelectasis in lower lungs bilaterally.  The patient has a remote exposure to asbestosis in his 68s, but his CT scan did not reveal any evidence for pleural based calcification.  There were diffuse atelectatic changes in the lungs bilaterally.  He also underwent a nuclear perfusion study which showed normal perfusion and function without scar or ischemia.  I had sent off laboratory was notable for a normal BMP 53.  Troponins were negative.  He had been prescribed levaquin by his primary physician as empiric therapy for a mild fever.  His fever resolved but he continues to experience shortness of breath.  His white blood count 13 days ago was 11.2.  He was macrocytic with an MCV of 102 with a normal hemoglobin at 16.2, hematocrit of 46.0.  B12 and folate levels were normal.  An erythrocyte sedimentation rate was increased at 61 and a high-sensitivity C-reactive protein was significantly increased at 139.    I referred him to [name] for pulmonary evaluation for possible interstitial lung disease and also [name] for rheumatologic follow-up.  He was given increased steroids.  In November 2017.  A high resolution CT scan of the chest showed patchy groundglass bilaterally andpatchy lower lobe atelectasis.  A barium swallow did not show evidence for esophageal disorder.  Antineutrophil cytoplasmic antibody testing was negative.  A follow-up high-resolution CT was done on June 12, 2016, which showed mild basilar predominant subpleural reticulation and groundglass suggesting nonspecific interstitial pneumonitis, however, will interstitial pneumonitis could not be excluded.  He was also noted to have aortic atherosclerosis and a punctate stone in his left kidney.  When seen in 2018 he felt improved.  Subsequent laboratory had shown a sedimentation rate which improved from 61 to  1 and CRP which had  improved from 139 to 6.8.  His lipids were elevated with a total cholesterol 222, triglycerides 359, HDL 67, VLDL 72, and LDL 83.  He developed myalgias with Crestor and Lipitor.  He denies chest pain. He is concerned about the cost of soe ofhis medicatios, partiyby.  He continues to be on pradaxa for anticoagulation and long-acting Cardizem.  He is unaware of any recent arrhythmia.  He had follow-up blood work on 01/15/2017 by Wende Neighbors in [place].  Hemoglobin 17.5, hematocrit 48.7.  Lipid studies were improved with a total cholesterol 220, triglycerides 175, HDL 72, LDL 113.  Hemoglobin A1c was 5.7.  He is on CPAP therapy.  He nasal pillows, small size, the D[employer] [facility].  He has undergone more extensive pulmonary evaluation for his diffuse parenchymal lung disease of undetermined cause.  The February 19, 2018 he underwent an open lung biopsy by Dr. Roxan Hockey and there was some suggestion of possible early fibrotic process suggestive of UIP.  Subsequently, his biopsy was evaluated at [facility] by Dr. Reggie Pile.  He was felt to have chronic fibrosing interstitial pneumonia with an unclassifiable pattern.  They did query the possibility of association with an underlying systemic inflammatory or connective tissue disorder.  They did not believe he had UIP. He has also undergone follow-up rheumatologic evaluation.  He was started on a trial of Ofev.  When I  saw him in December 2019 he denied any chest pain or palpitations, PND orthopnea,  presyncope or syncope. He was not short of breath at rest but does get short of breath with mild activity. He was on chronic steroids and inhalers and was on Cellcept and Ofev, followed by Dr Lake Bells.  He was admitted to the hospital on 06/15/2018 with acute diverticulitis requiring exploratory laparotomy and colostomy.The pt was taken off Cellcept and Ofev on admissionPostoperatively his QTC was noted to be prolonged-his QTC on 06/24/2018 was 624.  Antibiotics were adjusted.He saw Kerin Ransom, [facility]  for further evaluation on 06/25/2018. and f/u EKG. His QTC  in the office was 447. He is slowly improving. No palpitations or tachycardia, no unusual dyspnea. He has noted his B/P has been running [id number]'[id number] systolic.  I last assessed him in May 2020 and a telemedicine visit.  At that time, he told me that he will be in need for reversal of his colostomy with colostomy takedown and sometime this year will also require back surgery by Dr. Ellene Route.  His lung disease has improved with reinstitution of CellCept.  He denied any chest pain PND orthopnea.  He denies any palpitations.  Over the past month, he states his breathing has improved.  He was able to walk for significant duration recently went and noticed significant improvement from previously.  He is scheduled to see his primary physician later this week and had laboratory drawn on Oct 09, 2018.  Hemoglobin 16.1 hematocrit 47.5.  MCV had improved to 102 from a high of 110.  Renal function was stable with a creatinine of 0.83.  LFTs were normal.  Total cholesterol  was increased to 260 but triglycerides were improved at 145, LDL was 115 HDL cholesterol was excellent at 116.  Hemoglobin A1c was 6.0.  He continues to feel well.  He denies chest pain PND orthopnea.  He will be contacting Dr. Marlou Starks to arrange for reversal of his colostomy.  He presents for follow-up evaluation in person with ECG.  Past Medical History:  Diagnosis Date  . Arthritis   . Chest pain    a. 03/2017: echo showing EF of 60-65%, no regional WMA or significant valve abnormalities. b. 03/2016: NST with no evidence of ischemia.   . Digestive problems    on Prednisone prn for this issue- diarrhea  . Dyspnea    with activity  . Dysrhythmia    irregular due to Myocarditis- takes Atenolol, Norvasc  . GERD (gastroesophageal reflux disease)   . H/O viral myocarditis    25 years ago  . Head injury, closed, with concussion     Breif LOC  . Hyperlipidemia   . Mild mitral valve prolapse    per Dr Evette Georges notes  . PAF (paroxysmal atrial fibrillation) ([place])    a. initially occuring in 02/2016. b. recurrent in 07/2016. Placed on Eliquis  . Polymyalgia rheumatica ([place])   . Pre-diabetes   . Sleep apnea    mild sleep apnea   no CPAP    Past Surgical History:  Procedure Laterality Date  . apendectomy  1965  . APPENDECTOMY    . bone spur Bilateral 1999   feet  . CARDIAC CATHETERIZATION  2001  . CHOLECYSTECTOMY  2004  . COLON RESECTION N/A 06/15/2018   Procedure: SIGMOID COLON RESECTION;  Surgeon: [name], MD;  Location: [facility];  Service: General;  Laterality: N/A;  . COLONOSCOPY    . COLOSTOMY N/A 06/15/2018   Procedure: COLOSTOMY;  Surgeon: [name], MD;  Location: [facility];  Service: General;  Laterality: N/A;  . EYE SURGERY Bilateral    cataract removal  . LAPAROTOMY N/A 06/15/2018   Procedure: EXPLORATORY LAPAROTOMY;  Surgeon: [name], MD;  Location: [facility];  Service: General;  Laterality: N/A;  . LUMBAR LAMINECTOMY/ DECOMPRESSION WITH MET-RX Right 05/05/2013   Procedure: Right Lumbar three-four Extraforaminal Microdiskectomy with Metrex;  Surgeon: [name], MD;  Location: [facility] [place] [facility];  Service: Neurosurgery;  Laterality: Right;  Right Lumbar three-four Extraforaminal Microdiskectomy with Metrex  . LUNG BIOPSY Right 02/19/2018   Procedure: LUNG BIOPSY;  Surgeon: [name], MD;  Location: [place];  Service: Thoracic;  Laterality: Right;  . SHOULDER OPEN ROTATOR CUFF REPAIR Bilateral 2001  . TONSILLECTOMY    . VIDEO ASSISTED THORACOSCOPY Right 02/19/2018   Procedure: VIDEO ASSISTED THORACOSCOPY;  Surgeon: [name], MD;  Location: [place];  Service: Thoracic;  Laterality: Right;    Allergies  Allergen Reactions  . Lidocaine Hcl Anaphylaxis and Other (See Comments)    Xylocaine  . Xylocaine [Lidocaine] Anaphylaxis  . Codeine Nausea And Vomiting  . Crestor [Rosuvastatin  Calcium] Other (See Comments)    All-over body aches Myalgias   . Statins Other (See Comments)    Muscle soreness and an aching feeling all over Myalgias  . Percocet [Oxycodone-Acetaminophen] Itching    Current Outpatient Medications  Medication Sig Dispense Refill  . acetaminophen (TYLENOL) 500 MG tablet Take 1,000 mg by mouth every 6 (six) hours as needed for mild pain or headache.     . albuterol (PROVENTIL HFA;VENTOLIN HFA) 108 (90 Base) MCG/ACT inhaler Inhale 2 puffs  into the lungs every 4 (four) hours as needed for wheezing or shortness of breath.     . budesonide-formoterol (SYMBICORT) 80-4.5 MCG/ACT inhaler Inhale 2 puffs into the lungs 2 (two) times daily. 1 Inhaler 12  . cetirizine (ZYRTEC) 10 MG tablet Take 10 mg by mouth daily as needed for allergies.     Marland Kitchen diltiazem (CARDIZEM CD) 120 MG 24 hr capsule Take 1 capsule (120 mg total) by mouth daily. 30 capsule 3  . ELIQUIS 5 MG TABS tablet TAKE ONE TABLET (5MG TOTAL) BY MOUTH TWODAILY 180 tablet 1  . ezetimibe (ZETIA) 10 MG tablet TAKE ONE (1) TABLET BY MOUTH EVERY DAY 90 tablet 3  . fluticasone (FLONASE) 50 MCG/ACT nasal spray Place 2 sprays into both nostrils daily as needed for allergies or rhinitis.    Marland Kitchen gabapentin (NEURONTIN) 300 MG capsule Take 300 mg by mouth at bedtime.     . metoprolol tartrate (LOPRESSOR) 25 MG tablet TAKE ONE TABLET BY MOUTH TWICE A DAY 180 tablet 3  . mycophenolate (CELLCEPT) 500 MG tablet Take 500 mg by mouth 2 (two) times daily.     Marland Kitchen Neomycin-Bacitracin-Polymyxin (TRIPLE ANTIBIOTIC) [phone] OINT Apply 1 application topically daily.    Marland Kitchen OFEV 100 MG CAPS Take 1 capsule by mouth 2 (two) times daily.    . Omega-3 Fatty Acids (FISH OIL) 500 MG CAPS Take 1 capsule by mouth 2 (two) times daily.    . ondansetron (ZOFRAN) 4 MG tablet Take 4 mg by mouth every 8 (eight) hours as needed for nausea or vomiting.    . predniSONE (DELTASONE) 10 MG tablet Take 1 tablet (10 mg total) by mouth daily with  breakfast. 30 tablet 5  . Probiotic Product (PROBIOTIC DAILY PO) Take 1 tablet by mouth daily.    Marland Kitchen sulfamethoxazole-trimethoprim (BACTRIM DS) 800-160 MG tablet Take twice daily Mon, Wed, Fri 12 tablet 5  . tamsulosin (FLOMAX) 0.4 MG CAPS capsule Take 0.4 mg by mouth 2 (two) times daily.      No current facility-administered medications for this visit.     Social History   Socioeconomic History  . Marital status: Married    Spouse name: Not on file  . Number of children: Not on file  . Years of education: Not on file  . Highest education level: Not on file  Occupational History  . Not on file  Social Needs  . Financial resource strain: Not on file  . Food insecurity:    Worry: Not on file    Inability: Not on file  . Transportation needs:    Medical: Not on file    Non-medical: Not on file  Tobacco Use  . Smoking status: Never Smoker  . Smokeless tobacco: Never Used  Substance and Sexual Activity  . Alcohol use: Yes    Alcohol/week: 21.0 standard drinks    Types: 21 Glasses of wine per week    Comment: 2/3 glasses wine in evening  . Drug use: No  . Sexual activity: Yes    Birth control/protection: Other-see comments    Comment: old age  Lifestyle  . Physical activity:    Days per week: Not on file    Minutes per session: Not on file  . Stress: Not on file  Relationships  . Social connections:    Talks on phone: Not on file    Gets together: Not on file    Attends religious service: Not on file    Active member of club or organization:  Not on file    Attends meetings of clubs or organizations: Not on file    Relationship status: Not on file  . Intimate partner violence:    Fear of current or ex partner: Not on file    Emotionally abused: Not on file    Physically abused: Not on file    Forced sexual activity: Not on file  Other Topics Concern  . Not on file  Social History Narrative  . 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 He is married has 2 children.  There is no tobacco use. He stays active. There is no alcohol use.  He is still working but had a significantly reduced pace than he had previously.  Family history is notable that both parents are deceased.  Mother died of old age.  Father died but had a history of mitral valve prolapse.  He has 2 brothers and one sister who are  alive and well.  ROS General: Negative; No fevers, chills, or night sweats;  HEENT: Negative; No changes in vision or hearing, sinus congestion, difficulty swallowing Pulmonary:  shortness of breath with activity; undergoing evaluation for chronic fibrosing interstitial lung disease Cardiovascular: See history of present illness GI: Status post recent sigmoid colon perforation requiring emergent surgery with subsequent peritonitis and colostomy insertion. GU: Negative; No dysuria, hematuria, or difficulty voiding Musculoskeletal: Negative; no myalgias, joint pain, or weakness Hematologic/Oncology: Negative; no easy bruising, bleeding Endocrine: Negative; no heat/cold intolerance; no diabetes Neuro: Negative; no changes in balance, headaches Skin: Negative; No rashes or skin lesions Psychiatric: Negative; No behavioral problems, depression Sleep: Negative; No snoring, daytime sleepiness, hypersomnolence, bruxism, restless legs, hypnogognic hallucinations, no cataplexy Other comprehensive 14 point system review is negative.  PE Pulse 70   Temp 97.8 F (36.6 C)   Ht '5\' 9"'  (1.753 m)   Wt 178 lb (80.7 kg)   SpO2 95%   BMI 26.29 kg/m   Blood pressure by me was 130/70.  Pulse was regular at 70  Wt Readings from Last 3 Encounters:  10/13/18 178 lb (80.7 kg)  09/24/18 170 lb (77.1 kg)  07/22/18 172 lb 9.6 oz (78.3 kg)   General: Alert, oriented, no distress.  Skin: normal turgor, no rashes, warm and dry HEENT: Normocephalic, atraumatic. Pupils equal round and reactive to light; sclera anicteric; extraocular muscles intact;  Nose without nasal septal  hypertrophy Mouth/Parynx benign;  Neck: No JVD, no carotid bruits; normal carotid upstroke Lungs: breath sounds improved, no active wheezing or rales Chest wall: without tenderness to palpitation Heart: PMI not displaced, RRR, s1 s2 normal, 1/6 systolic murmur, no diastolic murmur, no rubs, gallops, thrills, or heaves Abdomen: soft, nontender; no hepatosplenomehaly, BS+; abdominal aorta nontender and not dilated by palpation. Back: no CVA tenderness Pulses 2+ Musculoskeletal: full range of motion, normal strength, no joint deformities Extremities: no clubbing cyanosis or edema, Homan's sign negative  Neurologic: grossly nonfocal; Cranial nerves grossly wnl Psychologic: Normal mood and affect  ECG (independently read by me): Normal sinus rhythm at 70 bpm.  Incomplete right bundle branch block.  QTc interval normal at 432 ms  06/25/2018 ECG (independently read by me): Normal sinus rhythm at 79 bpm.  QTc interval '4 4 7 ' ms.  05/12/2019 ECG (independently read by me): Normal sinus rhythm at 80 bpm.  No ectopy.  Normal intervals.  September 2018 ECG (independently read by me): Normal sinus rhythm at 65 bpm.  Isolated PAC.  Incomplete right bundle branch block.  Normal intervals.  March 2018 ECG (independently read by me): Normal sinus rhythm at 79 bpm.  QTc interval 460 ms.  PR interval normal at 158 ms.  November 2017 ECG (independently read by me): Normal sinus rhythm at 99 bpm, incomplete right bundle branch block.  Nondiagnostic ST-T changes.  January 2017 ECG (independently read by me): Normal sinus rhythm at 82 bpm.  Mild RV conduction delay.  Normal intervals.  No significant ST-T changes.  December 2015 ECG (independently read by me): Normal sinus rhythm at 74 bpm.  Mild RV conduction delay.  QTc interval 461 ms.  December 2014 ECG: Sinus rhythm at 57 beats per minute. No ectopy. Normal intervals.  LABS:  BMP Latest Ref Rng & Units 06/24/2018 06/23/2018 06/22/2018  Glucose 70 - 99  mg/dL 109(H) 148(H) 119(H)  BUN 8 - 23 mg/dL '9 11 10  ' Creatinine 0.61 - 1.24 mg/dL 0.77 0.73 0.64  Sodium 135 - 145 mmol/L 138 138 138  Potassium 3.5 - 5.1 mmol/L 3.9 3.9 3.5  Chloride 98 - 111 mmol/L 103 107 105  CO2 22 - 32 mmol/L '26 24 24  ' Calcium 8.9 - 10.3 mg/dL 8.3(L) 8.1(L) 8.5(L)   Hepatic Function Latest Ref Rng & Units 06/24/2018 06/22/2018 06/21/2018  Total Protein 6.5 - 8.1 g/dL 5.1(L) 5.2(L) 5.4(L)  Albumin 3.5 - 5.0 g/dL 2.4(L) 2.3(L) 2.4(L)  AST 15 - 41 U/L 14(L) 16 50(H)  ALT 0 - 44 U/L 30 38 46(H)  Alk Phosphatase 38 - 126 U/L 134(H) 156(H) 184(H)  Total Bilirubin 0.3 - 1.2 mg/dL 0.7 0.9 2.5(H)  Bilirubin, Direct 0.0 - 0.2 mg/dL 0.1 - -   CBC Latest Ref Rng & Units 06/24/2018 06/23/2018 06/22/2018  WBC 4.0 - 10.5 K/uL 9.2 7.9 7.8  Hemoglobin 13.0 - 17.0 g/dL 12.6(L) 12.2(L) 12.3(L)  Hematocrit 39.0 - 52.0 % 38.2(L) 36.7(L) 36.5(L)  Platelets 150 - 400 K/uL 353 347 298   Lab Results  Component Value Date   MCV 110.7 (H) 06/24/2018   MCV 106.4 (H) 06/23/2018   MCV 108.0 (H) 06/22/2018   Erythrocyte Sedimentation Rate     Component Value Date/Time   ESRSEDRATE 13 01/14/2018 0950   CRP November 2017: 139  Repeat CRP 07/16/16:  6  Lab Results  Component Value Date   TSH 2.31 07/16/2016     Lab Results  Component Value Date   HGBA1C 6.9 (H) 02/17/2018     Lipid Panel     Component Value Date/Time   CHOL 222 (H) 07/16/2016 0902   TRIG 359 (H) 07/16/2016 0902   HDL 67 07/16/2016 0902   CHOLHDL 3.3 07/16/2016 0902   VLDL 72 (H) 07/16/2016 0902   LDLCALC 83 07/16/2016 0902   ------------------------------------------------------------------------------------------------------------- Oxygen Saturation: Rest: 92% with pulse of 93  Following walking in the office: 93% with a pulse of 106  03/21/2016 Nuclear Study Highlights    The left ventricular ejection fraction is normal (55-65%).  Nuclear stress EF: 61%.  The study is normal.  This is a low risk  study.  There was no ST segment deviation noted during stress.     ------------------------------------------------------------------- 03/22/2016 ECHO Study Conclusions  - Left ventricle: The cavity size was normal. Wall thickness was   normal. Systolic function was normal. The estimated ejection   fraction was in the range of 60% to 65%. Wall motion was normal;   there were no regional wall motion abnormalities. Doppler   parameters are consistent with abnormal left ventricular   relaxation (grade 1 diastolic dysfunction). -  Aortic valve: There was no stenosis. - Aorta: Mildly dilated aortic root. Aortic root dimension: 38 mm   (ED). - Mitral valve: There was no significant regurgitation. - Right ventricle: The cavity size was normal. Systolic function   was normal. - Pulmonary arteries: No complete TR doppler jet so unable to   estimate PA systolic pressure. - Inferior vena cava: The vessel was normal in size. The   respirophasic diameter changes were in the normal range (>= 50%),   consistent with normal central venous pressure.  Impressions:  - Normal LV size with EF 60-65%. Normal RV size and systolic   function. No significant valvular abnormalities.  --------------------------------------------------------------------------------------------  04/02/2016 EXAM: CT CHEST WITHOUT CONTRAST  TECHNIQUE: Multidetector CT imaging of the chest was performed following the standard protocol without IV contrast.  COMPARISON:  None.  FINDINGS: Cardiovascular: The heart size is normal. No pericardial effusion. Atherosclerotic calcification is noted in the wall of the thoracic aorta.  Mediastinum/Nodes: No mediastinal lymphadenopathy. No evidence for gross hilar lymphadenopathy although assessment is limited by the lack of intravenous contrast on today's study. There is no axillary lymphadenopathy.  Lungs/Pleura: Fine detail obscured by breathing motion. Dependent  linear and platelike atelectasis noted in the lower lungs bilaterally. Components of associated parenchymal scarring not excluded. No evidence for pulmonary edema. No dense focal airspace consolidation. No evidence for pleural effusion.  Upper Abdomen: The liver shows diffusely decreased attenuation suggesting steatosis. Gallbladder surgically absent.  Musculoskeletal: Degenerative changes noted in the shoulders bilaterally.  IMPRESSION: Relatively diffuse atelectatic changes in the lungs bilaterally. Study degraded by patient breathing motion.  No specific findings to explain the patient's history of shortness of breath.   06/13/2016 CT Chest High Resolution  FINDINGS: Cardiovascular: Atherosclerotic calcification of the arterial vasculature. Heart size normal. No pericardial effusion.  Mediastinum/Nodes: No pathologically enlarged mediastinal or axillary lymph nodes. Hilar regions are difficult to evaluate without IV contrast. Esophagus is grossly unremarkable.  Lungs/Pleura: Mild basilar predominant subpleural reticulation and ground-glass with minimal architectural distortion. No definite traction bronchiectasis/bronchiolectasis or honeycombing. Findings are likely unchanged from 04/02/2016 but there was a great deal of respiratory motion on that exam. There may be minimal air trapping. No pleural fluid. Airway is unremarkable.  Upper Abdomen: Visualized portions of the liver, adrenal glands and right kidney are grossly unremarkable. Punctate stone in the left kidney. Visualized portions of the spleen, pancreas, stomach and bowel are grossly unremarkable. Cholecystectomy. No upper abdominal adenopathy.  Musculoskeletal: No worrisome lytic or sclerotic lesions. Degenerative changes are seen in the spine.  IMPRESSION: 1. Mild basilar predominant subpleural reticulation and ground-glass, suggesting nonspecific interstitial pneumonitis. Usual interstitial  pneumonitis cannot be excluded on this baseline examination. 2.  Aortic atherosclerosis (ICD10-170.0). 3. Punctate stone left kidney.   IMPRESSION:  1. Macrocytosis   2. CAD in native artery   3. PAF (paroxysmal atrial fibrillation) ([place])   4. Interstitial pneumonitis ([place])   5. Perforation of sigmoid colon ([place])   6. Mixed hyperlipidemia     ASSESSMENT AND PLAN: 1.  CAD: Mild mid systolic LAD bridging noted at catheterization in 2001.  No anginal symptoms.  Tolerated recent emergent bowel surgery without cardiovascular compromise.  2.  Spontaneous sigmoid colon perforation requiring emergent surgery with subsequent peritonitis: Colostomy in place.  He will need reversal of his colostomy later this month.  ECG today in office is stable.  QTc interval which was prolonged during his February hospitalization secondary to probable antibiotic therapy is normal.  Will be given clearance to  undergo his colostomy reversal with Dr. Marlou Starks.  3. PAF: Maintaining normal sinus rhythm.  He continues to be on Cardizem CD 120 mg in addition to metoprolol tartrate 25 mg twice a day.  ECG today stable.    4. Eliquis for anticoagulation.  Discussed holding Eliquis a minimum of 48 hours prior to his colostomy reversal unless otherwise needed longer duration per Dr. Marlou Starks.  He ultimately may also require future neurovascular surgery at which time Eliquis will need to be held for minimum of 72 hours.  5.  Fibrosing nonspecific interstitial pneumonitis followed by [name], currently back on CellCept and Ofev.  6.  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  7.  Next hyperlipidemia: He is on Zetia and was started on omega-3 fatty acid.  Most recent lipid studies show total cholesterol 260 with LDL 115.  HDL is excellent at 116, triglycerides have improved at 145.  8.  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   Time spent: 25 minutes Troy Sine, MD, [facility]  10/13/2018 12:26 PM

## 2018-10-14 ENCOUNTER — Other Ambulatory Visit: Payer: Self-pay | Admitting: Cardiology

## 2018-10-14 DIAGNOSIS — I10 Essential (primary) hypertension: Secondary | ICD-10-CM | POA: Diagnosis not present

## 2018-10-14 DIAGNOSIS — I2510 Atherosclerotic heart disease of native coronary artery without angina pectoris: Secondary | ICD-10-CM | POA: Diagnosis not present

## 2018-10-14 DIAGNOSIS — D692 Other nonthrombocytopenic purpura: Secondary | ICD-10-CM | POA: Diagnosis not present

## 2018-10-14 DIAGNOSIS — J849 Interstitial pulmonary disease, unspecified: Secondary | ICD-10-CM | POA: Diagnosis not present

## 2018-10-14 DIAGNOSIS — K5780 Diverticulitis of intestine, part unspecified, with perforation and abscess without bleeding: Secondary | ICD-10-CM | POA: Diagnosis not present

## 2018-10-14 DIAGNOSIS — G4730 Sleep apnea, unspecified: Secondary | ICD-10-CM | POA: Diagnosis not present

## 2018-10-14 DIAGNOSIS — Z48815 Encounter for surgical aftercare following surgery on the digestive system: Secondary | ICD-10-CM | POA: Diagnosis not present

## 2018-10-14 DIAGNOSIS — E0965 Drug or chemical induced diabetes mellitus with hyperglycemia: Secondary | ICD-10-CM | POA: Diagnosis not present

## 2018-10-14 DIAGNOSIS — I482 Chronic atrial fibrillation, unspecified: Secondary | ICD-10-CM | POA: Diagnosis not present

## 2018-10-14 DIAGNOSIS — I739 Peripheral vascular disease, unspecified: Secondary | ICD-10-CM | POA: Diagnosis not present

## 2018-10-14 DIAGNOSIS — M353 Polymyalgia rheumatica: Secondary | ICD-10-CM | POA: Diagnosis not present

## 2018-10-22 DIAGNOSIS — K5720 Diverticulitis of large intestine with perforation and abscess without bleeding: Secondary | ICD-10-CM | POA: Diagnosis not present

## 2018-11-03 DIAGNOSIS — Z86010 Personal history of colonic polyps: Secondary | ICD-10-CM | POA: Diagnosis not present

## 2018-11-03 DIAGNOSIS — R933 Abnormal findings on diagnostic imaging of other parts of digestive tract: Secondary | ICD-10-CM | POA: Diagnosis not present

## 2018-11-06 DIAGNOSIS — Z981 Arthrodesis status: Secondary | ICD-10-CM | POA: Diagnosis not present

## 2018-11-06 DIAGNOSIS — S32010A Wedge compression fracture of first lumbar vertebra, initial encounter for closed fracture: Secondary | ICD-10-CM | POA: Diagnosis not present

## 2018-11-11 NOTE — Telephone Encounter (Signed)
Opened in error

## 2018-11-17 ENCOUNTER — Other Ambulatory Visit: Payer: Self-pay

## 2018-11-17 MED ORDER — MYCOPHENOLATE MOFETIL 500 MG PO TABS: 1000.0000 mg | ORAL_TABLET | Freq: Two times a day (BID) | ORAL | 2 refills | Status: DC

## 2018-11-26 ENCOUNTER — Telehealth: Payer: Self-pay | Admitting: Pulmonary Disease

## 2018-11-26 MED ORDER — MYCOPHENOLATE MOFETIL 500 MG PO TABS: 1000.0000 mg | ORAL_TABLET | Freq: Two times a day (BID) | ORAL | 2 refills | Status: DC

## 2018-11-26 NOTE — Telephone Encounter (Signed)
[place] and spoke with Wells Guiles. Per Wells Guiles, a refill request was sent over for pt's cellcept to be refilled but they have not heard anything back in regards to this. Stated to Wells Guiles that someone had sent refill electronically to [facility] on 7/6. Per Wells Guiles, we needed to make sure the pharmacy is [facility] as they do not receive any refills when it is sent to [facility]. Wells Guiles gave me the fax number for the [place] and I have now listed that in pt's pharmacy list and have starred it. Rx was resent and sent to [facility]. Nothing further needed.

## 2018-11-28 DIAGNOSIS — S32010D Wedge compression fracture of first lumbar vertebra, subsequent encounter for fracture with routine healing: Secondary | ICD-10-CM | POA: Diagnosis not present

## 2018-11-28 DIAGNOSIS — S32010A Wedge compression fracture of first lumbar vertebra, initial encounter for closed fracture: Secondary | ICD-10-CM | POA: Diagnosis not present

## 2018-12-02 ENCOUNTER — Telehealth: Payer: Self-pay

## 2018-12-02 NOTE — Telephone Encounter (Signed)
   [facility] Pre-operative Risk Assessment    Request for surgical clearance:  1. What type of surgery is being performed? COLONOSCOPY   2. When is this surgery scheduled? 12-26-2018   3. What type of clearance is required (medical clearance vs. Pharmacy clearance to hold med vs. Both)? MEDICATION  4. Are there any medications that need to be held prior to surgery and how long?ELIQUIS   5. Practice name and name of physician performing surgery? [facility]     6. What is your office phone number [phone]    7.   What is your office fax number [phone]  8.   Anesthesia type (None, local, MAC, general) ?    Waylan Rocher 12/02/2018, 7:42 AM  _________________________________________________________________   (provider comments below)

## 2018-12-02 NOTE — Telephone Encounter (Signed)
   Primary Cardiologist: Shelva Majestic, MD  Chart reviewed as part of pre-operative protocol coverage. Patient was contacted 12/02/2018 in reference to pre-operative risk assessment for pending surgery as outlined below.  [name] was last seen on 10/13/2018 by Dr. Claiborne Billings.   [name] was cleared for prior colostomy reversal at that time. He had no cardiac complaints. His Qtc was improved per EKG on last OV (was secondary to antibiotic therapy). His GI team is now requesting pharmacy clearance for follow up colonoscopy to be performed 12/26/2018.   [name] has a diagnosis of Afib and is on Eliquis for anticoagulation.    Procedure: COLONOSCOPY Date of procedure: 12/26/2018  CHADS2-VASc score of  4 (CHF, HTN, AGE, CAD)  CrCl 88 ml/min  Per office protocol and pharmacy recommendations, patient can hold Eliquis for 1 day prior to procedure.    Therefore, based on ACC/AHA guidelines, the patient would be at acceptable risk for the planned procedure without further cardiovascular testing.   I will route this recommendation to the requesting party via Epic fax function and remove from pre-op pool.  Please call with questions.  Kathyrn Drown, NP 12/02/2018, 11:35 AM

## 2018-12-02 NOTE — Telephone Encounter (Signed)
Patient with diagnosis of Afib on Eliquis for anticoagulation.    Procedure: COLONOSCOPY  Date of procedure: 12/26/2018  CHADS2-VASc score of  4 (CHF, HTN, AGE, CAD)  CrCl 88 ml/min  Per office protocol, patient can hold Eliquis for 1 day prior to procedure.

## 2018-12-04 DIAGNOSIS — M542 Cervicalgia: Secondary | ICD-10-CM | POA: Diagnosis not present

## 2018-12-04 DIAGNOSIS — J84112 Idiopathic pulmonary fibrosis: Secondary | ICD-10-CM | POA: Diagnosis not present

## 2018-12-04 DIAGNOSIS — M353 Polymyalgia rheumatica: Secondary | ICD-10-CM | POA: Diagnosis not present

## 2018-12-04 DIAGNOSIS — R768 Other specified abnormal immunological findings in serum: Secondary | ICD-10-CM | POA: Diagnosis not present

## 2018-12-04 DIAGNOSIS — Z13820 Encounter for screening for osteoporosis: Secondary | ICD-10-CM | POA: Diagnosis not present

## 2018-12-04 DIAGNOSIS — M25511 Pain in right shoulder: Secondary | ICD-10-CM | POA: Diagnosis not present

## 2018-12-08 DIAGNOSIS — Z933 Colostomy status: Secondary | ICD-10-CM | POA: Diagnosis not present

## 2018-12-12 ENCOUNTER — Other Ambulatory Visit: Payer: Self-pay

## 2018-12-16 ENCOUNTER — Other Ambulatory Visit: Payer: Self-pay | Admitting: Pulmonary Disease

## 2018-12-17 DIAGNOSIS — Z933 Colostomy status: Secondary | ICD-10-CM | POA: Diagnosis not present

## 2018-12-22 ENCOUNTER — Other Ambulatory Visit: Payer: Self-pay

## 2018-12-22 DIAGNOSIS — Z20822 Contact with and (suspected) exposure to covid-19: Secondary | ICD-10-CM

## 2018-12-23 LAB — NOVEL CORONAVIRUS, NAA: SARS-CoV-2, NAA: NOT DETECTED

## 2018-12-24 DIAGNOSIS — J029 Acute pharyngitis, unspecified: Secondary | ICD-10-CM | POA: Diagnosis not present

## 2018-12-24 DIAGNOSIS — J849 Interstitial pulmonary disease, unspecified: Secondary | ICD-10-CM | POA: Diagnosis not present

## 2018-12-24 DIAGNOSIS — M353 Polymyalgia rheumatica: Secondary | ICD-10-CM | POA: Diagnosis not present

## 2018-12-26 DIAGNOSIS — Z980 Intestinal bypass and anastomosis status: Secondary | ICD-10-CM | POA: Diagnosis not present

## 2018-12-26 DIAGNOSIS — K5730 Diverticulosis of large intestine without perforation or abscess without bleeding: Secondary | ICD-10-CM | POA: Diagnosis not present

## 2018-12-26 DIAGNOSIS — Z86010 Personal history of colonic polyps: Secondary | ICD-10-CM | POA: Diagnosis not present

## 2018-12-26 DIAGNOSIS — D124 Benign neoplasm of descending colon: Secondary | ICD-10-CM | POA: Diagnosis not present

## 2018-12-30 DIAGNOSIS — D124 Benign neoplasm of descending colon: Secondary | ICD-10-CM | POA: Diagnosis not present

## 2019-01-13 ENCOUNTER — Telehealth: Payer: Self-pay | Admitting: Pulmonary Disease

## 2019-01-13 ENCOUNTER — Ambulatory Visit: Payer: Self-pay | Admitting: General Surgery

## 2019-01-13 NOTE — Telephone Encounter (Signed)
Called and spoke to pt. Pt states he has an upcoming surgery on 9/14 for a colostomy takedown and was advised to take Neomycin the day before his surgery. The pt was questioned by Dr. Marlou Starks (surgeon) if he should still be taking the Bactrim and Ofev during the time of the antibiotics. Former BQ pt.    Due to the time of day will send this to APP of the day for 9/2. Sarah please advise. Thanks.

## 2019-01-14 NOTE — Telephone Encounter (Signed)
Called and spoke to patient.  Patient was having some cell reception issues but we were able to schedule patient for a 30 minute appointment with Dr. Carlis Abbott for surgical clearance/discussion of medications. Nothing further needed at this time.

## 2019-01-14 NOTE — Telephone Encounter (Signed)
Please schedule with Dr. Tarry Kos or Dr. Tamala Julian  for surgical clearance ASAP . The Ofev will need to be held 2 days pre-op and will need to be held until after ensured wound healing, usually 4-6 weeks. This patient is also on Cellcept and Bactrim. These will need to be assessed at the office visit clearance.Thanks

## 2019-01-14 NOTE — Pre-Procedure Instructions (Addendum)
[place], [place] [address] Phone: [phone] Fax: [place], [address] [place] [address] Phone: [phone] Fax: [phone]  [facility] - [place], [address] [place] [address] Phone: [phone] Fax: [phone]      Your procedure is scheduled on Monday, September 14th  .  Report to [facility] Main Entrance "A" at 7:30 A.M., and check in at the Admitting office.  Call this number if you have problems the morning of surgery:  [phone]  Call [phone] if you have any questions prior to your surgery date Monday-Friday 8am-4pm    Remember:  Do not eat after midnight the night before your surgery  You may drink clear liquids until 6:30 the morning of your surgery.   Clear liquids allowed are: Water, Non-Citrus Juices (without pulp), Carbonated Beverages, Clear Tea, Black Coffee Only, and Gatorade    Please complete your PRE-SURGERY ENSURE that was provided to you. Drink (2) the night prior to surgery and drink (1) the morning of surgery by 6:30 A.M.  Please, if able, drink it in one setting. DO NOT SIP.  Take these medicines the morning of surgery with A SIP OF WATER  diltiazem (CARDIZEM CD)  gabapentin (NEURONTIN)  metoprolol tartrate (LOPRESSOR)  mycophenolate (CELLCEPT) predniSONE (DELTASONE)  sulfamethoxazole-trimethoprim (BACTRIM DS)  tamsulosin (FLOMAX)  budesonide-formoterol (SYMBICORT)  As needed:  acetaminophen (TYLENOL) Inhaler-please bring with you to the hospital diphenoxylate-atropine (LOMOTIL) fluticasone (FLONASE) ondansetron [facility])   Per your cardiologist, HOLD Eliquis 1 day prior to surgery. Last dose on 01/24/19.   7 days prior to surgery STOP taking any Aspirin (unless otherwise instructed by your surgeon),  Aleve, Naproxen, Ibuprofen, Motrin, Advil, Goody's, BC's, all herbal medications, fish oil, and all vitamins.    The Morning of Surgery  Do not wear jewelry.  Do not wear lotions, powders, or colognes, or deodorant  Men may shave face and neck.  Do not bring valuables to the hospital.  [facility] is not responsible for any belongings or valuables.  If you are a smoker, DO NOT Smoke 24 hours prior to surgery IF you wear a CPAP at night please bring your mask, tubing, and machine the morning of surgery   Remember that you must have someone to transport you home after your surgery, and remain with you for 24 hours if you are discharged the same day.   Contacts, glasses, hearing aids, dentures or bridgework may not be worn into surgery.    Leave your suitcase in the car.  After surgery it may be brought to your room.  For patients admitted to the hospital, discharge time will be determined by your treatment team.  Patients discharged the day of surgery will not be allowed to drive home.    Special instructions:   [facility]- Preparing For Surgery  Before surgery, you can play an important role. Because skin is not sterile, your skin needs to be as free of germs as possible. You can reduce the number of germs on your skin by washing with CHG (chlorahexidine gluconate) Soap before surgery.  CHG is an antiseptic cleaner which kills germs and bonds with the skin to continue killing germs even after washing.    Oral Hygiene is also important to reduce your risk of infection.  Remember - BRUSH YOUR  TEETH THE MORNING OF SURGERY WITH YOUR REGULAR TOOTHPASTE  Please do not use if you have an allergy to CHG or antibacterial soaps. If your skin becomes reddened/irritated stop using the CHG.  Do not shave (including legs and underarms) for at least 48 hours prior to first CHG shower. It is OK to shave your face.  Please follow these instructions carefully.   1. Shower the NIGHT BEFORE SURGERY  and the MORNING OF SURGERY with CHG Soap.   2. If you chose to wash your hair, wash your hair first as usual with your normal shampoo.  3. After you shampoo, rinse your hair and body thoroughly to remove the shampoo.  4. Use CHG as you would any other liquid soap. You can apply CHG directly to the skin and wash gently with a scrungie or a clean washcloth.   5. Apply the CHG Soap to your body ONLY FROM THE NECK DOWN.  Do not use on open wounds or open sores. Avoid contact with your eyes, ears, mouth and genitals (private parts). Wash Face and genitals (private parts)  with your normal soap.   6. Wash thoroughly, paying special attention to the area where your surgery will be performed.  7. Thoroughly rinse your body with warm water from the neck down.  8. DO NOT shower/wash with your normal soap after using and rinsing off the CHG Soap.  9. Pat yourself dry with a CLEAN TOWEL.  10. Wear CLEAN PAJAMAS to bed the night before surgery, wear comfortable clothes the morning of surgery  11. Place CLEAN SHEETS on your bed the night of your first shower and DO NOT SLEEP WITH PETS.    Day of Surgery:  Do not apply any deodorants/lotions. Please shower the morning of surgery with the CHG soap  Please wear clean clothes to the hospital/surgery center.   Remember to brush your teeth WITH YOUR REGULAR TOOTHPASTE.   Please read over the following fact sheets that you were given.

## 2019-01-15 ENCOUNTER — Encounter (HOSPITAL_COMMUNITY): Payer: Self-pay

## 2019-01-15 ENCOUNTER — Other Ambulatory Visit: Payer: Self-pay

## 2019-01-15 ENCOUNTER — Encounter (HOSPITAL_COMMUNITY)
Admission: RE | Admit: 2019-01-15 | Discharge: 2019-01-15 | Disposition: A | Discharge status: Home / Self Care | Payer: PPO | Source: Ambulatory Visit | Attending: General Surgery | Admitting: General Surgery

## 2019-01-15 DIAGNOSIS — Z7951 Long term (current) use of inhaled steroids: Secondary | ICD-10-CM | POA: Insufficient documentation

## 2019-01-15 DIAGNOSIS — E785 Hyperlipidemia, unspecified: Secondary | ICD-10-CM | POA: Diagnosis not present

## 2019-01-15 DIAGNOSIS — Z7901 Long term (current) use of anticoagulants: Secondary | ICD-10-CM | POA: Insufficient documentation

## 2019-01-15 DIAGNOSIS — J8410 Pulmonary fibrosis, unspecified: Secondary | ICD-10-CM | POA: Diagnosis not present

## 2019-01-15 DIAGNOSIS — Z01818 Encounter for other preprocedural examination: Secondary | ICD-10-CM | POA: Insufficient documentation

## 2019-01-15 DIAGNOSIS — M353 Polymyalgia rheumatica: Secondary | ICD-10-CM | POA: Diagnosis not present

## 2019-01-15 DIAGNOSIS — Z933 Colostomy status: Secondary | ICD-10-CM | POA: Insufficient documentation

## 2019-01-15 DIAGNOSIS — Z981 Arthrodesis status: Secondary | ICD-10-CM | POA: Insufficient documentation

## 2019-01-15 DIAGNOSIS — K219 Gastro-esophageal reflux disease without esophagitis: Secondary | ICD-10-CM | POA: Insufficient documentation

## 2019-01-15 DIAGNOSIS — Z7952 Long term (current) use of systemic steroids: Secondary | ICD-10-CM | POA: Diagnosis not present

## 2019-01-15 DIAGNOSIS — Z79899 Other long term (current) drug therapy: Secondary | ICD-10-CM | POA: Diagnosis not present

## 2019-01-15 DIAGNOSIS — M1990 Unspecified osteoarthritis, unspecified site: Secondary | ICD-10-CM | POA: Insufficient documentation

## 2019-01-15 DIAGNOSIS — I480 Paroxysmal atrial fibrillation: Secondary | ICD-10-CM | POA: Diagnosis not present

## 2019-01-15 DIAGNOSIS — G4733 Obstructive sleep apnea (adult) (pediatric): Secondary | ICD-10-CM | POA: Diagnosis not present

## 2019-01-15 DIAGNOSIS — R7303 Prediabetes: Secondary | ICD-10-CM | POA: Diagnosis not present

## 2019-01-15 HISTORY — DX: Allergy to other foods: Z91.018

## 2019-01-15 HISTORY — DX: Pulmonary fibrosis, unspecified: J84.10

## 2019-01-15 HISTORY — DX: Other complications of anesthesia, initial encounter: T88.59XA

## 2019-01-15 LAB — CBC
HCT: 44.7 % (ref 39.0–52.0)
Hemoglobin: 15.2 g/dL (ref 13.0–17.0)
MCH: 36.3 pg — ABNORMAL HIGH (ref 26.0–34.0)
MCHC: 34 g/dL (ref 30.0–36.0)
MCV: 106.7 fL — ABNORMAL HIGH (ref 80.0–100.0)
Platelets: 237 10*3/uL (ref 150–400)
RBC: 4.19 MIL/uL — ABNORMAL LOW (ref 4.22–5.81)
RDW: 13.4 % (ref 11.5–15.5)
WBC: 9.7 10*3/uL (ref 4.0–10.5)
nRBC: 0 % (ref 0.0–0.2)

## 2019-01-15 LAB — COMPREHENSIVE METABOLIC PANEL
ALT: 13 U/L (ref 0–44)
AST: 19 U/L (ref 15–41)
Albumin: 3.6 g/dL (ref 3.5–5.0)
Alkaline Phosphatase: 55 U/L (ref 38–126)
Anion gap: 12 (ref 5–15)
BUN: 7 mg/dL — ABNORMAL LOW (ref 8–23)
CO2: 23 mmol/L (ref 22–32)
Calcium: 8.8 mg/dL — ABNORMAL LOW (ref 8.9–10.3)
Chloride: 102 mmol/L (ref 98–111)
Creatinine, Ser: 0.94 mg/dL (ref 0.61–1.24)
GFR calc Af Amer: >60 mL/min (ref >60)
GFR calc non Af Amer: >60 mL/min (ref >60)
Glucose, Bld: 203 mg/dL — ABNORMAL HIGH (ref 70–99)
Potassium: 3.9 mmol/L (ref 3.5–5.1)
Sodium: 137 mmol/L (ref 135–145)
Total Bilirubin: 1.4 mg/dL — ABNORMAL HIGH (ref 0.3–1.2)
Total Protein: 5.9 g/dL — ABNORMAL LOW (ref 6.5–8.1)

## 2019-01-15 NOTE — Progress Notes (Signed)
PCP - Dr. Allyn Kenner Cardiologist - Dr. Darene Lamer Kelly-cardiac clearance on 12/02/18 in EPIC  Chest x-ray - 03/18/18 EKG - 10/13/18 Stress Test -03/21/16  ECHO - 04/01/16 Cardiac Cath - 2001  Sleep Study - OSA+ CPAP - denies use.   Blood Thinner Instructions: Eliquis hold 1 day prior to surgery per cards. Pt states he was told 3 days prior and will stop 3 days prior to surgery.  Aspirin Instructions:N/A  Anesthesia review: yes, cardiac clearance on 12/02/18, hx of A. Fib  Patient denies shortness of breath, fever, cough and chest pain at PAT appointment   Patient verbalized understanding of instructions that were given to them at the PAT appointment. Patient was also instructed that they will need to review over the PAT instructions again at home before surgery.   Coronavirus Screening  Have you experienced the following symptoms:  Cough yes/no: No Fever (>100.23F)  yes/no: No Runny nose yes/no: No Sore throat yes/no: No Difficulty breathing/shortness of breath  yes/no: No  Have you or a family member traveled in the last 14 days and where? yes/no: No   If the patient indicates "YES" to the above questions, their PAT will be rescheduled to limit the exposure to others and, the surgeon will be notified. THE PATIENT WILL NEED TO BE ASYMPTOMATIC FOR 14 DAYS.   If the patient is not experiencing any of these symptoms, the PAT nurse will instruct them to NOT bring anyone with them to their appointment since they may have these symptoms or traveled as well.   Please remind your patients and families that hospital visitation restrictions are in effect and the importance of the restrictions.

## 2019-01-16 ENCOUNTER — Encounter (HOSPITAL_COMMUNITY): Payer: Self-pay

## 2019-01-16 NOTE — Progress Notes (Addendum)
Anesthesia Chart Review:  Case: [id number] Date/Time: 01/26/19 0915   Procedure: LAPAROSCOPIC ASSISTED COLOSTOMY TAKEDOWN (N/A )   Anesthesia type: General   Pre-op diagnosis: COLOSTOMY   Location: [facility] OR ROOM 02 / [facility]   Surgeon: [name], MD      DISCUSSION: Patient is a 73 year old male scheduled for the above procedure. He is s/p colon resection and colostomy for perforated sigmoid colon 06/15/18.    History includes never smoker, GERD, viral myocarditis (1989, EF 25%, recovered), MVP (2001 cath, not mentioned on 03/22/16 echo), PAF, polymyalgia rheumatica, HLD, pre-diabetes ("steroid induced"), exertional dyspnea, OSA ("mild", no CPAP), pulmonary fibrosis (s/p right VATS, lung biopsy x4 02/19/18, pathology most suggestive of UIP, second opinon sent to [facility] Reggie [facility]: chronic fibrosing interstitial pneumonia unclassifiable pattern), back surgeries (L3-4 microdiskectomy; L2-5 PLIF 02/01/15), perforated sigmoid colon (s/p sigmoid colon resection, end colostomy 06/15/18), alpha gal (2015; says he has been able to eat red meat for the past 2 1/2 years without reaction.)  Reported anaphylactic reaction to Xylocaine when used for shoulder injection > 10 years ago, but says he later received marcaine by neurosurgeon Dr. Ellene Route without known reaction.     Patient was last evaluated by cardiologist Dr. Claiborne Billings on 10/13/18. Patient notified him of plans for colostomy reversal and also of possible upcoming back surgery. He advised at least 48 hours for colostomy reversal and minimum of 72 hours for neurovascular surgery. Patient to hold Eliquis 3 days prior to this surgery.  He has an appointment with pulmonologist Noemi Chapel, DO on 01/21/19 for preoperative pulmonology evaluation. Based on 01/14/19 note by Eric Form, NP, pulmonary medications to be discussed--mention of holding Ofev for 2 days and resume once good wound healing. Patient also on Cellcept and Bactrim. (Per pharmacy representative, patient will  need to bring his own Ofev if pulmonology recommends he take during his hospitalization. I notified patient.)  Random glucose 203--he reported history of pre-diabetes. Too late to add on A1c to PAT labs. He says he has not had an A1c in the past 2 months, but reportedly came out of the pre-diabetes range once he began tapering prednisone. Could consider getting an A1c during his admission, but will defer to primary service. He will get a fasting CBG on the day of surgery.   Presurgical COVID-19 test is scheduled for 01/22/19. Patient to follow pulmonologist's recommendations regarding perioperative pulmonary fibrosis medication instructions. He has been on and off prednisone for several years. He is currently taking prednisone 8 mg daily (decreasing by 1 mg per month; taper started at a 10 mg dose). Defer to pulmonology and/or anesthesiologist regarding any stress dose steroids.   UPDATE: Perioperative input/recommendations as outlined by pulmonologist Noemi Chapel, DO on 9//9/20 include:    -Optimized for surgery from a Pulmonary standpoint. ARISCAT score 3 (low risk; associated with 1.6% risk of post-op pulmonary complications). -Continue current regimen of nintedanib, mycophenolate, and prednisone + Bactrim MWF (would need to continue Bactrim for a month after stopping mycophenolate) - In preparation for surgery, hold nintedanib and cellcept starting 01/21/19 for wound healing and bleeding purposes. Can restart 4 weeks after surgery as long as things are doing well. -Contact Pulmonology if needed during hospitalization - Consider stress dose steroids perioperatively given chronic steroid use for PMR - Monitor LFT Q 3 months. Hyperbilirubinemia without transaminase elevation possible related to nintedanib. No currently indication to stop.     VS: BP 122/67   Pulse 95   Temp 36.7 C  Resp 20   Ht '5\' 9"'  (1.753 m)   Wt 79.8 kg   SpO2 96%   BMI 25.98 kg/m     PROVIDERS: [name], MD is  PCP  Shelva Majestic, MD is cardiologist. Last visit 10/13/18.  Hurley Cisco, MD is rheumatologist Simonne Maffucci, MD is pulmonologist   LABS: Labs reviewed: Acceptable for surgery. He needs CBG on arrival. Consider A1c while hospitalized.  (all labs ordered are listed, but only abnormal results are displayed)  Labs Reviewed  COMPREHENSIVE METABOLIC PANEL - Abnormal; Notable for the following components:      Result Value   Glucose, Bld 203 (*)    BUN 7 (*)    Calcium 8.8 (*)    Total Protein 5.9 (*)    Total Bilirubin 1.4 (*)    All other components within normal limits  CBC - Abnormal; Notable for the following components:   RBC 4.19 (*)    MCV 106.7 (*)    MCH 36.3 (*)    All other components within normal limits     IMAGES: CXR 03/18/18 (post right VATS 02/19/18): FINDINGS: Postoperative change is again seen in the right chest with streaky opacities in the right lung base, slightly improved since the most recent comparison examination. The left lung is clear. There is no pneumothorax. Heart size is normal. No pleural effusion. Aortic atherosclerosis noted. No acute or focal bony abnormality. IMPRESSION: Postoperative change right chest. Streaky opacities in the right lung base appears slightly improved compared to the most recent study. No new abnormality.   EKG: 10/13/18 ([facility]): NSR. Incomplete RBBB.   CV: Echo 03/22/16: Study Conclusions - Left ventricle: The cavity size was normal. Wall thickness was   normal. Systolic function was normal. The estimated ejection   fraction was in the range of 60% to 65%. Wall motion was normal;   there were no regional wall motion abnormalities. Doppler   parameters are consistent with abnormal left ventricular   relaxation (grade 1 diastolic dysfunction). - Aortic valve: There was no stenosis. - Aorta: Mildly dilated aortic root. Aortic root dimension: 38 mm   (ED). - Mitral valve: There was no significant  regurgitation. - Right ventricle: The cavity size was normal. Systolic function   was normal. - Pulmonary arteries: No complete TR doppler jet so unable to   estimate PA systolic pressure. - Inferior vena cava: The vessel was normal in size. The   respirophasic diameter changes were in the normal range (>= 50%),   consistent with normal central venous pressure. Impressions: - Normal LV size with EF 60-65%. Normal RV size and systolic   function. No significant valvular abnormalities.   Nuclear stress test 03/21/16: Study Highlights  The left ventricular ejection fraction is normal (55-65%).  Nuclear stress EF: 61%.  The study is normal.  This is a low risk study.  There was no ST segment deviation noted during stress.   Cardiac cath 05/24/99: Impression: 1.  Normal left ventricular function. 2.  Angiographic mitral valve prolapse. 3.  No evidence of obstructive coronary disease. 4.  Mid left anterior descending coronary artery muscle bridging in an intramyocardial segment with normal appearance during diastole, and narrowing up to 60% during systole. Recommendations: 1.  Addition of nitrate, versus calcium channel blockade. 2.  Beta-blocker regimen, as blood pressure tolerates it.   Past Medical History:  Diagnosis Date  . Allergy to alpha-gal    2015; has been able to re-introduce red meat for the past 2 1/2  years without reaction (as of 01/21/19)  . Arthritis   . Chest pain    a. 03/2017: echo showing EF of 60-65%, no regional WMA or significant valve abnormalities. b. 03/2016: NST with no evidence of ischemia.   . Complication of anesthesia    "anaphylactic" reaction after given xylocaine for shoulder injection > 10 years ago; reported no known reaction when given marcaine (as of 01/21/19)   . Digestive problems    on Prednisone prn for this issue- diarrhea  . Dyspnea    with activity  . Dysrhythmia    irregular due to Myocarditis- takes Atenolol, Norvasc  . GERD  (gastroesophageal reflux disease)   . H/O viral myocarditis    25 years ago  . Head injury, closed, with concussion    Breif LOC  . Hyperlipidemia   . Mild mitral valve prolapse    per Dr Evette Georges notes  . PAF (paroxysmal atrial fibrillation) ([place])    a. initially occuring in 02/2016. b. recurrent in 07/2016. Placed on Eliquis  . Polymyalgia rheumatica ([place])   . Pre-diabetes   . Pulmonary fibrosis ([place])   . Sleep apnea    mild sleep apnea   no CPAP    Past Surgical History:  Procedure Laterality Date  . apendectomy  1965  . APPENDECTOMY    . bone spur Bilateral 1999   feet  . CARDIAC CATHETERIZATION  2001  . CHOLECYSTECTOMY  2004  . COLON RESECTION N/A 06/15/2018   Procedure: SIGMOID COLON RESECTION;  Surgeon: [name], MD;  Location: [facility];  Service: General;  Laterality: N/A;  . COLONOSCOPY    . COLOSTOMY N/A 06/15/2018   Procedure: COLOSTOMY;  Surgeon: [name], MD;  Location: [facility];  Service: General;  Laterality: N/A;  . EYE SURGERY Bilateral    cataract removal  . LAPAROTOMY N/A 06/15/2018   Procedure: EXPLORATORY LAPAROTOMY;  Surgeon: [name], MD;  Location: [facility];  Service: General;  Laterality: N/A;  . LUMBAR LAMINECTOMY/ DECOMPRESSION WITH MET-RX Right 05/05/2013   Procedure: Right Lumbar three-four Extraforaminal Microdiskectomy with Metrex;  Surgeon: [name], MD;  Location: [facility];  Service: Neurosurgery;  Laterality: Right;  Right Lumbar three-four Extraforaminal Microdiskectomy with Metrex  . LUNG BIOPSY Right 02/19/2018   Procedure: LUNG BIOPSY;  Surgeon: [name], MD;  Location: [facility];  Service: Thoracic;  Laterality: Right;  . SHOULDER OPEN ROTATOR CUFF REPAIR Bilateral 2001  . TONSILLECTOMY    . VIDEO ASSISTED THORACOSCOPY Right 02/19/2018   Procedure: VIDEO ASSISTED THORACOSCOPY;  Surgeon: [name], MD;  Location: [facility] OR;  Service: Thoracic;  Laterality: Right;    MEDICATIONS: . acetaminophen (TYLENOL)  500 MG tablet  . albuterol (PROVENTIL HFA;VENTOLIN HFA) 108 (90 Base) MCG/ACT inhaler  . budesonide-formoterol (SYMBICORT) 80-4.5 MCG/ACT inhaler  . cetirizine (ZYRTEC) 10 MG tablet  . diltiazem (CARDIZEM CD) 120 MG 24 hr capsule  . diphenoxylate-atropine (LOMOTIL) 2.5-0.025 MG tablet  . ELIQUIS 5 MG TABS tablet  . ezetimibe (ZETIA) 10 MG tablet  . fluticasone (FLONASE) 50 MCG/ACT nasal spray  . gabapentin (NEURONTIN) 300 MG capsule  . metoprolol tartrate (LOPRESSOR) 25 MG tablet  . mycophenolate (CELLCEPT) 500 MG tablet  . OFEV 100 MG CAPS  . Omega-3 Fatty Acids (FISH OIL) 500 MG CAPS  . ondansetron (ZOFRAN) 4 MG tablet  . predniSONE (DELTASONE) 10 MG tablet  . Probiotic Product (PROBIOTIC DAILY PO)  . sulfamethoxazole-trimethoprim (BACTRIM DS) 800-160 MG tablet  . tamsulosin (FLOMAX) 0.4  MG CAPS capsule   No current facility-administered medications for this encounter.     Myra Gianotti, PA-C Surgical Short Stay/Anesthesiology [facility] Phone [phone] [facility] Phone [phone] 01/21/2019 1:46 PM

## 2019-01-21 ENCOUNTER — Ambulatory Visit (INDEPENDENT_AMBULATORY_CARE_PROVIDER_SITE_OTHER): Payer: PPO | Admitting: Critical Care Medicine

## 2019-01-21 ENCOUNTER — Other Ambulatory Visit: Payer: Self-pay

## 2019-01-21 ENCOUNTER — Encounter (HOSPITAL_COMMUNITY): Payer: Self-pay

## 2019-01-21 ENCOUNTER — Encounter: Payer: Self-pay | Admitting: Critical Care Medicine

## 2019-01-21 VITALS — BP 114/70 | HR 80 | Temp 97.3°F | Ht 69.0 in | Wt 176.0 lb

## 2019-01-21 DIAGNOSIS — J8489 Other specified interstitial pulmonary diseases: Secondary | ICD-10-CM

## 2019-01-21 DIAGNOSIS — D899 Disorder involving the immune mechanism, unspecified: Secondary | ICD-10-CM

## 2019-01-21 DIAGNOSIS — D849 Immunodeficiency, unspecified: Secondary | ICD-10-CM

## 2019-01-21 NOTE — Progress Notes (Signed)
Synopsis: Referred in November 2017 for ILD by Randall Squibb, Roberts.  Previously patient of [name].  Subjective:   [name] GENDER: male DOB: 1945-06-29, MRN: [id number]  Chief Complaint  Patient presents with   Consult    Patient is scheduled for a colonstomy procedure. Needs to know if he can stop taking his medication for a few days prior to procedure. Patient states his breathing has been great and improving since last visit.     [name] is a 73 year old gentleman with a history of fibrosing NSIP diagnosed on open lung biopsy in 2019 who presents for routine follow-up and preoperative evaluation before colostomy reversal next week.  He has a history of polymyalgia rheumatica for which she has been on chronic steroids.  He is followed by [name] from rheumatology.  Currently he is being titrated off his chronic prednisone, currently on 8 mg daily.  For his NSIP he is maintained on CellCept, nintedanib, and Bactrim 3 days a week.  He is doing well, better than last year from breathlessness standpoint.  He went hiking with his wife a few weeks ago, which he reports he would have been unable to do a year ago.  He has nausea and diarrhea associated with nintedanib, but he is able to tolerate these with the use of Zofran and Imodium.  No jaundice, itching, right upper quadrant pain.  In February 2020 he underwent emergency partial colectomy with diverting colostomy for perforated diverticulitis.  He is planning to undergo colostomy reversal next week.  In July he fell, causing an L1 compression fracture.  [name] is planning to do a bone mineral density study, and currently he is not on calcium and vitamin D supplements.  In a few months he is planning on having a small outpatient back surgery to address a bone spur that is causing radicular pain in his left leg.  Today he wants to discuss plans for his pulmonary meds perioperatively.  He  has previously had symptomatic renal insufficiency when he tried to come off prednisone on his own too quickly.  During his VATS and colectomy he received 10 mg of dexamethasone in the OR, and he received low-dose Solu-Medrol postoperatively from his colectomy.    Flowsheet data:  Lab Results  Component Value Date   NITRICOXIDE 8 03/03/2018    Past Medical History:  Diagnosis Date   Allergy to alpha-gal    2015; has been able to re-introduce red meat for the past 2 1/2 years without reaction (as of 01/21/19)   Arthritis    Chest pain    a. 03/2017: echo showing EF of 60-65%, no regional WMA or significant valve abnormalities. b. 03/2016: NST with no evidence of ischemia.    Complication of anesthesia    "anaphylactic" reaction after given xylocaine for shoulder injection > 10 years ago; reported no known reaction when given marcaine (as of 01/21/19)    Digestive problems    on Prednisone prn for this issue- diarrhea   Dyspnea    with activity   Dysrhythmia    irregular due to Myocarditis- takes Atenolol, Norvasc   GERD (gastroesophageal reflux disease)    H/O viral myocarditis    25 years ago   Head injury, closed, with concussion    Breif LOC   Hyperlipidemia    Mild mitral valve prolapse    per Dr Evette Roberts notes   PAF (paroxysmal atrial fibrillation) ([place])    a. initially occuring in  02/2016. b. recurrent in 07/2016. Placed on Eliquis   Polymyalgia rheumatica (HCC)    Pre-diabetes    Pulmonary fibrosis (HCC)    Sleep apnea    mild sleep apnea   no CPAP     Family History  Problem Relation Age of Onset   Rheum arthritis Mother    Thyroid cancer Mother    Heart disease Father    Asthma Father    Thyroid cancer Sister    Melanoma Brother      Past Surgical History:  Procedure Laterality Date   apendectomy  1965   APPENDECTOMY     bone spur Bilateral 1999   feet   CARDIAC CATHETERIZATION  2001   CHOLECYSTECTOMY  2004   COLON RESECTION  N/A 06/15/2018   Procedure: SIGMOID COLON RESECTION;  Surgeon: [name];  Location: [facility];  Service: General;  Laterality: N/A;   COLONOSCOPY     COLOSTOMY N/A 06/15/2018   Procedure: COLOSTOMY;  Surgeon: [name];  Location: [facility];  Service: General;  Laterality: N/A;   EYE SURGERY Bilateral    cataract removal   LAPAROTOMY N/A 06/15/2018   Procedure: EXPLORATORY LAPAROTOMY;  Surgeon: [name];  Location: [facility];  Service: General;  Laterality: N/A;   LUMBAR LAMINECTOMY/ DECOMPRESSION WITH MET-RX Right 05/05/2013   Procedure: Right Lumbar three-four Extraforaminal Microdiskectomy with Metrex;  Surgeon: [name];  Location: [facility];  Service: Neurosurgery;  Laterality: Right;  Right Lumbar three-four Extraforaminal Microdiskectomy with Metrex   LUNG BIOPSY Right 02/19/2018   Procedure: LUNG BIOPSY;  Surgeon: [name];  Location: [facility];  Service: Thoracic;  Laterality: Right;   SHOULDER OPEN ROTATOR CUFF REPAIR Bilateral 2001   TONSILLECTOMY     VIDEO ASSISTED THORACOSCOPY Right 02/19/2018   Procedure: VIDEO ASSISTED THORACOSCOPY;  Surgeon: [name];  Location: [facility];  Service: Thoracic;  Laterality: Right;    Social History   Socioeconomic History   Marital status: Married    Spouse name: Not on file   Number of children: Not on file   Years of education: Not on file   Highest education level: Not on file  Occupational History   Not on file  Social Needs   Financial resource strain: Not on file   Food insecurity    Worry: Not on file    Inability: Not on file   Transportation needs    Medical: Not on file    Non-medical: Not on file  Tobacco Use   Smoking status: Never Smoker   Smokeless tobacco: Never Used  Substance and Sexual Activity   Alcohol use: Yes    Alcohol/week: 21.0 standard drinks    Types: 21 Glasses of wine per week    Comment: 2/3 glasses wine in evening   Drug use:  No   Sexual activity: Yes    Birth control/protection: Other-see comments    Comment: old age  Lifestyle   Physical activity    Days per week: Not on file    Minutes per session: Not on file   Stress: Not on file  Relationships   Social connections    Talks on phone: Not on file    Gets together: Not on file    Attends religious service: Not on file    Active member of club or organization: Not on file    Attends meetings of clubs or organizations: Not on file    Relationship status:  Not on file   Intimate partner violence    Fear of current or ex partner: Not on file    Emotionally abused: Not on file    Physically abused: Not on file    Forced sexual activity: Not on file  Other Topics Concern   Not on file  Social History Narrative   Not on file     Allergies  Allergen Reactions   Lidocaine Hcl Anaphylaxis and Other (See Comments)    Xylocaine   Xylocaine [Lidocaine] Anaphylaxis   Codeine Nausea And Vomiting   Crestor [Rosuvastatin Calcium] Other (See Comments)    All-over body aches Myalgias    Statins Other (See Comments)    Muscle soreness and an aching feeling all over Myalgias   Percocet [Oxycodone-Acetaminophen] Itching     Immunization History  Administered Date(s) Administered   Influenza Split 03/20/2016   Influenza, High Dose Seasonal PF 01/29/2018   Influenza,inj,Quad PF,6+ Mos 03/12/2017   Pneumococcal Conjugate-13 01/10/2018    Outpatient Medications Prior to Visit  Medication Sig Dispense Refill   acetaminophen (TYLENOL) 500 MG tablet Take 1,000 mg by mouth every 6 (six) hours as needed for mild pain or headache.      albuterol (PROVENTIL HFA;VENTOLIN HFA) 108 (90 Base) MCG/ACT inhaler Inhale 2 puffs into the lungs every 4 (four) hours as needed for wheezing or shortness of breath.      budesonide-formoterol (SYMBICORT) 80-4.5 MCG/ACT inhaler Inhale 2 puffs into the lungs 2 (two) times daily. (Patient taking differently:  Inhale 2 puffs into the lungs daily. ) 1 Inhaler 12   cetirizine (ZYRTEC) 10 MG tablet Take 10 mg by mouth daily as needed for allergies.      diltiazem (CARDIZEM CD) 120 MG 24 hr capsule TAKE ONE CAPSULE BY MOUTH DAILY 30 capsule 6   diphenoxylate-atropine (LOMOTIL) 2.5-0.025 MG tablet Take 1 tablet by mouth 4 (four) times daily as needed for diarrhea or loose stools.     ELIQUIS 5 MG TABS tablet TAKE ONE TABLET (5MG TOTAL) BY MOUTH TWODAILY (Patient taking differently: Take 5 mg by mouth 2 (two) times daily. ) 180 tablet 1   ezetimibe (ZETIA) 10 MG tablet TAKE ONE (1) TABLET BY MOUTH EVERY DAY (Patient taking differently: Take 10 mg by mouth daily. ) 90 tablet 3   fluticasone (FLONASE) 50 MCG/ACT nasal spray Place 2 sprays into both nostrils daily as needed for allergies or rhinitis.     gabapentin (NEURONTIN) 300 MG capsule Take 300 mg by mouth 2 (two) times daily.      metoprolol tartrate (LOPRESSOR) 25 MG tablet TAKE ONE TABLET BY MOUTH TWICE A DAY 180 tablet 3   mycophenolate (CELLCEPT) 500 MG tablet Take 2 tablets (1,000 mg total) by mouth 2 (two) times daily. 120 tablet 2   OFEV 100 MG CAPS Take 1 capsule by mouth 2 (two) times daily.     Omega-3 Fatty Acids (FISH OIL) 500 MG CAPS Take 1 capsule by mouth 2 (two) times daily.     ondansetron (ZOFRAN) 4 MG tablet Take 4 mg by mouth every 8 (eight) hours as needed for nausea or vomiting.     predniSONE (DELTASONE) 10 MG tablet Take 1 tablet (10 mg total) by mouth daily with breakfast. (Patient taking differently: Take 8 mg by mouth daily with breakfast. ) 30 tablet 5   Probiotic Product (PROBIOTIC DAILY PO) Take 1 tablet by mouth daily.     sulfamethoxazole-trimethoprim (BACTRIM DS) 800-160 MG tablet TAKE ONE TABLET BY MOUTH  TWICE A DAY ON MONDAY, WEDNESDAY, AND FRIDAY. 12 tablet 1   tamsulosin (FLOMAX) 0.4 MG CAPS capsule Take 0.4 mg by mouth 2 (two) times daily.      No facility-administered medications prior to visit.      Review of Systems  Constitutional: Negative for chills, fever and weight loss.  HENT: Negative.   Eyes: Negative.   Respiratory: Negative for cough, hemoptysis and shortness of breath.   Cardiovascular: Negative for chest pain and leg swelling.  Gastrointestinal: Positive for diarrhea and nausea. Negative for blood in stool, constipation and vomiting.  Genitourinary: Negative.   Musculoskeletal: Negative for myalgias.       Back pain from compression fracture  Skin: Negative for rash.  Neurological: Negative for focal weakness and weakness.       No perineal or thigh numbness, no weakness in legs.  Has radicular pain wrapping from left hip to medial thigh  Endo/Heme/Allergies: Negative.   Psychiatric/Behavioral: Negative.      Objective:   Vitals:   01/21/19 1522  BP: 114/70  Pulse: 80  Temp: (!) 97.3 F (36.3 C)  TempSrc: Oral  SpO2: 96%  Weight: 176 lb (79.8 kg)  Height: '5\' 9"'  (1.753 m)   96% on RA BMI Readings from Last 3 Encounters:  01/21/19 25.99 kg/m  01/15/19 25.98 kg/m  10/13/18 26.29 kg/m   Wt Readings from Last 3 Encounters:  01/21/19 176 lb (79.8 kg)  01/15/19 175 lb 14.4 oz (79.8 kg)  10/13/18 178 lb (80.7 kg)    Physical Exam Vitals signs reviewed.  Constitutional:      Appearance: Normal appearance. He is not ill-appearing.  HENT:     Head: Normocephalic and atraumatic.     Nose:     Comments: Deferred due to masking requirement.    Mouth/Throat:     Comments: Deferred due to masking requirement. Eyes:     General: No scleral icterus. Neck:     Musculoskeletal: Neck supple.  Cardiovascular:     Rate and Rhythm: Normal rate and regular rhythm.     Heart sounds: No murmur.  Pulmonary:     Comments: Breathing comfortably on room air, no conversational dyspnea or tachypnea. Clear to auscultation bilaterally.  No coughing. Abdominal:     General: There is no distension.     Comments: Ostomy in LLQ  Musculoskeletal:        General:  No swelling or deformity.  Lymphadenopathy:     Cervical: No cervical adenopathy.  Skin:    General: Skin is warm and dry.     Coloration: Skin is not jaundiced.     Findings: Bruising present. No rash.  Neurological:     General: No focal deficit present.     Mental Status: He is alert.     Motor: No weakness.     Coordination: Coordination normal.      CBC    Component Value Date/Time   WBC 9.7 01/15/2019 1540   RBC 4.19 (L) 01/15/2019 1540   HGB 15.2 01/15/2019 1540   HCT 44.7 01/15/2019 1540   PLT 237 01/15/2019 1540   MCV 106.7 (H) 01/15/2019 1540   MCH 36.3 (H) 01/15/2019 1540   MCHC 34.0 01/15/2019 1540   RDW 13.4 01/15/2019 1540   LYMPHSABS 1.5 04/14/2018 1229   MONOABS 0.8 04/14/2018 1229   EOSABS 0.2 04/14/2018 1229   BASOSABS 0.0 04/14/2018 1229    CHEMISTRY Recent Labs  Lab 01/15/19 1540  NA 137  K 3.9  CL 102  CO2 23  GLUCOSE 203*  BUN 7*  CREATININE 0.94  CALCIUM 8.8*   Estimated Creatinine Clearance: 72.1 mL/min (by C-G formula based on SCr of 0.94 mg/dL).  Previous labs reviewed: November 2017 reviewed: CRP 139, total CK normal, antineutrophil cytoplasmic antibody testing negative  October 2019 alpha-1 antitrypsin M-M  Chest Imaging- films reviewed: CT chest 12/06/2016 - Groundglass opacities and interlobular septal thickening in the lower lobes. No honeycombing. No adenopathy or pleural disease.     CT abdomen pelvis 07/01/2018-  Scars in the RLL and RML from previous biopsy. Persistent GGO and interlobular septal thickening. Does not appear to have progressive fibrosis compared to previous CT scan.  Pulmonary Functions Testing Results: PFT Results Latest Ref Rng & Units 01/10/2018 10/26/2016 05/03/2016  FVC-Pre L 3.52 4.21 3.55  FVC-Predicted Pre % 82 96 80  FVC-Post L 3.53 4.08 3.65  FVC-Predicted Post % 83 93 83  Pre FEV1/FVC % % 81 78 81  Post FEV1/FCV % % 82 81 83  FEV1-Pre L 2.85 3.30 2.88  FEV1-Predicted Pre % 91 102 88  FEV1-Post  L 2.88 3.30 3.02  DLCO UNC% % 68 84 75  DLCO COR %Predicted % 81 87 91  TLC L 7.25 6.79 5.85  TLC % Predicted % 106 98 84  RV % Predicted % 155 102 94    Pathology 03/01/2018: Biopsies from right upper lobe, right middle lobe, right lower lobe showing temporal and spatial variation in a pattern of fibrosing interstitial pneumonia.  Prominent fibroblastic foci numerous areas of osseous metaplasia.  No honeycombing.  Areas of spared alveoli.  Echocardiogram 03/22/2016: LVEF 60 to 65%, normal wall motion, grade 1 diastolic dysfunction.  Normal RV.  Normal left atrium.  Normal valves    Assessment & Plan:     ICD-10-CM   1. NSIP (nonspecific interstitial pneumonia) ([place])  J84.89   2. Immunosuppressed status ([place])  D89.9   3. Hyperbilirubinemia  E80.6    Fibrosing NSIP on nintedanib & mycophenolate- stable and doing well. -Optimized for surgery from a Pulmonary standpoint. ARISCAT score 3 (low risk; associated with 1.6% risk of post-op pulmonary complications). -Con't current regimen of nintedanib, mycophenolate, and prednisone + Bactrim MWF  -in preparation for surgery, holding nintedanib and cellcept starting today for wound healing and bleeding purposes. Can restart 4 weeks after surgery as long as things are doing well. -Needs surveillance LFTs & CBC in December; discussed warning symptoms of liver dysfunction to watch for. -Con't zofran & immodium for GI symptom management with nintedanib; some people have less side effects after an interval off of the meds. -Reminded to remain vigilant with mask wearing and social distancing to avoid COVID-19. -Please feel free to call Pulmonology regarding any questions or issues while he is admitted to the hospital.  Chronic immunosuppression- prednisone & mycophenolate -consider stress dose steroids perioperatively given chronic steroid use for PMR -con't Bactrim three days per week; would need to be continued for a month after stopping  mycophenolate  Hyperbilirubinemia w/o transaminase elevation, possibly related to nintedanib. -Con't to monitor Q3 months on nintedanib; with normal transaminases, no indication to stop. -Discussed warning symptoms for liver dysfunction.  RTC about 1 month post-op.   Current Outpatient Medications:    acetaminophen (TYLENOL) 500 MG tablet, Take 1,000 mg by mouth every 6 (six) hours as needed for mild pain or headache. , Disp: , Rfl:    albuterol (PROVENTIL HFA;VENTOLIN HFA) 108 (90 Base) MCG/ACT inhaler, Inhale 2 puffs into the  lungs every 4 (four) hours as needed for wheezing or shortness of breath. , Disp: , Rfl:    budesonide-formoterol (SYMBICORT) 80-4.5 MCG/ACT inhaler, Inhale 2 puffs into the lungs 2 (two) times daily. (Patient taking differently: Inhale 2 puffs into the lungs daily. ), Disp: 1 Inhaler, Rfl: 12   cetirizine (ZYRTEC) 10 MG tablet, Take 10 mg by mouth daily as needed for allergies. , Disp: , Rfl:    diltiazem (CARDIZEM CD) 120 MG 24 hr capsule, TAKE ONE CAPSULE BY MOUTH DAILY, Disp: 30 capsule, Rfl: 6   diphenoxylate-atropine (LOMOTIL) 2.5-0.025 MG tablet, Take 1 tablet by mouth 4 (four) times daily as needed for diarrhea or loose stools., Disp: , Rfl:    ELIQUIS 5 MG TABS tablet, TAKE ONE TABLET (5MG TOTAL) BY MOUTH TWODAILY (Patient taking differently: Take 5 mg by mouth 2 (two) times daily. ), Disp: 180 tablet, Rfl: 1   ezetimibe (ZETIA) 10 MG tablet, TAKE ONE (1) TABLET BY MOUTH EVERY DAY (Patient taking differently: Take 10 mg by mouth daily. ), Disp: 90 tablet, Rfl: 3   fluticasone (FLONASE) 50 MCG/ACT nasal spray, Place 2 sprays into both nostrils daily as needed for allergies or rhinitis., Disp: , Rfl:    gabapentin (NEURONTIN) 300 MG capsule, Take 300 mg by mouth 2 (two) times daily. , Disp: , Rfl:    metoprolol tartrate (LOPRESSOR) 25 MG tablet, TAKE ONE TABLET BY MOUTH TWICE A DAY, Disp: 180 tablet, Rfl: 3   mycophenolate (CELLCEPT) 500 MG tablet, Take  2 tablets (1,000 mg total) by mouth 2 (two) times daily., Disp: 120 tablet, Rfl: 2   OFEV 100 MG CAPS, Take 1 capsule by mouth 2 (two) times daily., Disp: , Rfl:    Omega-3 Fatty Acids (FISH OIL) 500 MG CAPS, Take 1 capsule by mouth 2 (two) times daily., Disp: , Rfl:    ondansetron (ZOFRAN) 4 MG tablet, Take 4 mg by mouth every 8 (eight) hours as needed for nausea or vomiting., Disp: , Rfl:    predniSONE (DELTASONE) 10 MG tablet, Take 1 tablet (10 mg total) by mouth daily with breakfast. (Patient taking differently: Take 8 mg by mouth daily with breakfast. ), Disp: 30 tablet, Rfl: 5   Probiotic Product (PROBIOTIC DAILY PO), Take 1 tablet by mouth daily., Disp: , Rfl:    sulfamethoxazole-trimethoprim (BACTRIM DS) 800-160 MG tablet, TAKE ONE TABLET BY MOUTH TWICE A DAY ON MONDAY, WEDNESDAY, AND FRIDAY., Disp: 12 tablet, Rfl: 1   tamsulosin (FLOMAX) 0.4 MG CAPS capsule, Take 0.4 mg by mouth 2 (two) times daily. , Disp: , Rfl:    Julian Hy, DO [facility] 01/21/2019 3:34 PM

## 2019-01-21 NOTE — Anesthesia Preprocedure Evaluation (Addendum)
Anesthesia Evaluation  Patient identified by MRN, date of birth, ID band Patient awake    Reviewed: Allergy & Precautions, H&P , NPO status , Patient's Chart, lab work & pertinent test results, reviewed documented beta blocker date and time   Airway Mallampati: II  TM Distance: >3 FB Neck ROM: Full    Dental no notable dental hx. (+) Teeth Intact, Dental Advisory Given   Pulmonary sleep apnea ,  Pulmonary fibrosis   Pulmonary exam normal breath sounds clear to auscultation       Cardiovascular hypertension, On Medications and On Home Beta Blockers + dysrhythmias  Rhythm:Regular Rate:Normal     Neuro/Psych negative neurological ROS  negative psych ROS   GI/Hepatic Neg liver ROS, GERD  Medicated and Controlled,  Endo/Other  negative endocrine ROS  Renal/GU negative Renal ROS  negative genitourinary   Musculoskeletal  (+) Arthritis , Osteoarthritis,    Abdominal   Peds  Hematology negative hematology ROS (+)   Anesthesia Other Findings   Reproductive/Obstetrics negative OB ROS                            Anesthesia Physical Anesthesia Plan  ASA: III  Anesthesia Plan: General   Post-op Pain Management:    Induction: Intravenous  PONV Risk Score and Plan: 3 and Ondansetron, Dexamethasone and Midazolam  Airway Management Planned: Oral ETT  Additional Equipment:   Intra-op Plan:   Post-operative Plan: Extubation in OR  Informed Consent: I have reviewed the patients History and Physical, chart, labs and discussed the procedure including the risks, benefits and alternatives for the proposed anesthesia with the patient or authorized representative who has indicated his/her understanding and acceptance.     Dental advisory given  Plan Discussed with: CRNA  Anesthesia Plan Comments: (PAT note written 01/21/2019 by Myra Gianotti, PA-C. Has card preoperative cardiology and pulmonology  input. Consider stress dose steroids given chronic prednisone for PMR. )      Anesthesia Quick Evaluation

## 2019-01-21 NOTE — Patient Instructions (Addendum)
Thank you for visiting Dr. Carlis Abbott at [facility]. We recommend the following:  Stop Ofev & cellcept now in preparation for surgery. Stay off of them for 4 weeks after surgery. Keep taking your prednisone daily and Bactrim 3 days per week as prescribed. After surgery walk, sit in the chair, and use your incentive spirometer as prescribed by your surgeon to reduce your risk of post-operative pneumonia.  Follow up about a month after surgery.    Please do your part to reduce the spread of COVID-19.

## 2019-01-22 ENCOUNTER — Inpatient Hospital Stay (HOSPITAL_COMMUNITY)
Admission: RE | Admit: 2019-01-22 | Discharge: 2019-01-22 | Disposition: A | Discharge status: Home / Self Care | Payer: PPO | Source: Ambulatory Visit

## 2019-01-22 ENCOUNTER — Other Ambulatory Visit (HOSPITAL_COMMUNITY)
Admission: RE | Admit: 2019-01-22 | Discharge: 2019-01-22 | Disposition: A | Discharge status: Home / Self Care | Payer: PPO | Source: Ambulatory Visit | Attending: General Surgery | Admitting: General Surgery

## 2019-01-22 ENCOUNTER — Other Ambulatory Visit: Payer: Self-pay

## 2019-01-22 DIAGNOSIS — Z01812 Encounter for preprocedural laboratory examination: Secondary | ICD-10-CM | POA: Insufficient documentation

## 2019-01-22 DIAGNOSIS — Z20828 Contact with and (suspected) exposure to other viral communicable diseases: Secondary | ICD-10-CM | POA: Diagnosis not present

## 2019-01-22 LAB — SARS CORONAVIRUS 2 (TAT 6-24 HRS): SARS Coronavirus 2: NEGATIVE

## 2019-01-22 NOTE — Progress Notes (Signed)
Contacted patient because he stated he was told that he could go to the [facility] site for his pre-procedure COVID test.  Patient stated that he is going to the site at [place] today

## 2019-01-23 ENCOUNTER — Other Ambulatory Visit (HOSPITAL_COMMUNITY): Admission: RE | Admit: 2019-01-23 | Payer: PPO | Source: Ambulatory Visit

## 2019-01-26 ENCOUNTER — Other Ambulatory Visit: Payer: Self-pay

## 2019-01-26 ENCOUNTER — Encounter (HOSPITAL_COMMUNITY): Payer: Self-pay | Admitting: Surgery

## 2019-01-26 ENCOUNTER — Encounter (HOSPITAL_COMMUNITY)
Admission: RE | Disposition: A | Discharge status: Home / Self Care | Payer: Self-pay | Source: Home / Self Care | Attending: General Surgery

## 2019-01-26 ENCOUNTER — Inpatient Hospital Stay (HOSPITAL_COMMUNITY): Payer: PPO | Admitting: Vascular Surgery

## 2019-01-26 ENCOUNTER — Inpatient Hospital Stay (HOSPITAL_COMMUNITY)
Admission: RE | Admit: 2019-01-26 | Discharge: 2019-01-30 | DRG: 336 | Disposition: A | Discharge status: Home / Self Care | Payer: PPO | Attending: General Surgery | Admitting: General Surgery

## 2019-01-26 ENCOUNTER — Inpatient Hospital Stay (HOSPITAL_COMMUNITY): Payer: PPO | Admitting: Anesthesiology

## 2019-01-26 DIAGNOSIS — K660 Peritoneal adhesions (postprocedural) (postinfection): Secondary | ICD-10-CM | POA: Diagnosis present

## 2019-01-26 DIAGNOSIS — Z433 Encounter for attention to colostomy: Principal | ICD-10-CM

## 2019-01-26 DIAGNOSIS — Z933 Colostomy status: Secondary | ICD-10-CM

## 2019-01-26 DIAGNOSIS — G4730 Sleep apnea, unspecified: Secondary | ICD-10-CM | POA: Diagnosis present

## 2019-01-26 DIAGNOSIS — K219 Gastro-esophageal reflux disease without esophagitis: Secondary | ICD-10-CM | POA: Diagnosis present

## 2019-01-26 DIAGNOSIS — K5732 Diverticulitis of large intestine without perforation or abscess without bleeding: Secondary | ICD-10-CM | POA: Diagnosis not present

## 2019-01-26 DIAGNOSIS — G4733 Obstructive sleep apnea (adult) (pediatric): Secondary | ICD-10-CM | POA: Diagnosis not present

## 2019-01-26 DIAGNOSIS — M1990 Unspecified osteoarthritis, unspecified site: Secondary | ICD-10-CM | POA: Diagnosis present

## 2019-01-26 DIAGNOSIS — I480 Paroxysmal atrial fibrillation: Secondary | ICD-10-CM | POA: Diagnosis not present

## 2019-01-26 DIAGNOSIS — Z79899 Other long term (current) drug therapy: Secondary | ICD-10-CM

## 2019-01-26 DIAGNOSIS — I10 Essential (primary) hypertension: Secondary | ICD-10-CM | POA: Diagnosis not present

## 2019-01-26 HISTORY — PX: COLOSTOMY TAKEDOWN: SHX5258

## 2019-01-26 HISTORY — PX: LAPAROSCOPIC LYSIS OF ADHESIONS: SHX5905

## 2019-01-26 LAB — GLUCOSE, CAPILLARY
Glucose-Capillary: 174 mg/dL — ABNORMAL HIGH (ref 70–99)
Glucose-Capillary: 193 mg/dL — ABNORMAL HIGH (ref 70–99)
Glucose-Capillary: 201 mg/dL — ABNORMAL HIGH (ref 70–99)

## 2019-01-26 LAB — PROTIME-INR
INR: 0.9 (ref 0.8–1.2)
Prothrombin Time: 12.2 seconds (ref 11.4–15.2)

## 2019-01-26 SURGERY — CLOSURE, COLOSTOMY, LAPAROSCOPIC
Anesthesia: General | Site: Abdomen

## 2019-01-26 MED ORDER — EPHEDRINE 5 MG/ML INJ
INTRAVENOUS | Status: AC
  Filled 2019-01-26: qty 10

## 2019-01-26 MED ORDER — HYDROMORPHONE HCL 1 MG/ML IJ SOLN
0.2500 mg | INTRAMUSCULAR | Status: DC | PRN
  Administered 2019-01-26 (×2): 0.5 mg via INTRAVENOUS
  Administered 2019-01-26 (×4): 0.25 mg via INTRAVENOUS

## 2019-01-26 MED ORDER — STERILE WATER FOR IRRIGATION IR SOLN
Status: DC | PRN
  Administered 2019-01-26: 1000 mL

## 2019-01-26 MED ORDER — 0.9 % SODIUM CHLORIDE (POUR BTL) OPTIME
TOPICAL | Status: DC | PRN
  Administered 2019-01-26: 2000 mL

## 2019-01-26 MED ORDER — ROCURONIUM BROMIDE 10 MG/ML (PF) SYRINGE
PREFILLED_SYRINGE | INTRAVENOUS | Status: DC | PRN
  Administered 2019-01-26 (×2): 40 mg via INTRAVENOUS
  Administered 2019-01-26: 20 mg via INTRAVENOUS
  Administered 2019-01-26: 30 mg via INTRAVENOUS

## 2019-01-26 MED ORDER — SODIUM CHLORIDE 0.9 % IV SOLN
2.0000 g | INTRAVENOUS | Status: AC
  Administered 2019-01-26: 2 g via INTRAVENOUS
  Filled 2019-01-26: qty 2

## 2019-01-26 MED ORDER — FENTANYL CITRATE (PF) 250 MCG/5ML IJ SOLN
INTRAMUSCULAR | Status: DC | PRN
  Administered 2019-01-26 (×5): 50 ug via INTRAVENOUS

## 2019-01-26 MED ORDER — HYDROMORPHONE HCL 1 MG/ML IJ SOLN
INTRAMUSCULAR | Status: AC
  Filled 2019-01-26: qty 0.5

## 2019-01-26 MED ORDER — CHLORHEXIDINE GLUCONATE CLOTH 2 % EX PADS: 6.0000 | MEDICATED_PAD | Freq: Once | CUTANEOUS | Status: DC

## 2019-01-26 MED ORDER — PROPOFOL 10 MG/ML IV BOLUS
INTRAVENOUS | Status: DC | PRN
  Administered 2019-01-26: 120 mg via INTRAVENOUS

## 2019-01-26 MED ORDER — PHENYLEPHRINE HCL (PRESSORS) 10 MG/ML IV SOLN
INTRAVENOUS | Status: DC | PRN
  Administered 2019-01-26: 80 ug via INTRAVENOUS
  Administered 2019-01-26 (×2): 120 ug via INTRAVENOUS

## 2019-01-26 MED ORDER — PROCHLORPERAZINE EDISYLATE 10 MG/2ML IJ SOLN: 5.0000 mg | Freq: Four times a day (QID) | INTRAMUSCULAR | Status: DC | PRN

## 2019-01-26 MED ORDER — ONDANSETRON HCL 4 MG/2ML IJ SOLN
INTRAMUSCULAR | Status: DC | PRN
  Administered 2019-01-26: 4 mg via INTRAVENOUS

## 2019-01-26 MED ORDER — METHYLPREDNISOLONE SODIUM SUCC 125 MG IJ SOLR
INTRAMUSCULAR | Status: DC | PRN
  Administered 2019-01-26: 125 mg via INTRAVENOUS

## 2019-01-26 MED ORDER — ONDANSETRON HCL 4 MG/2ML IJ SOLN
INTRAMUSCULAR | Status: AC
  Filled 2019-01-26: qty 2

## 2019-01-26 MED ORDER — LACTATED RINGERS IV SOLN
INTRAVENOUS | Status: DC
  Administered 2019-01-26 (×3): via INTRAVENOUS

## 2019-01-26 MED ORDER — METHOCARBAMOL 1000 MG/10ML IJ SOLN
500.0000 mg | Freq: Three times a day (TID) | INTRAVENOUS | Status: DC
  Administered 2019-01-26 – 2019-01-30 (×12): 500 mg via INTRAVENOUS
  Filled 2019-01-26: qty 5
  Filled 2019-01-26: qty 500
  Filled 2019-01-26 (×5): qty 5
  Filled 2019-01-26: qty 500
  Filled 2019-01-26 (×2): qty 5
  Filled 2019-01-26 (×2): qty 500
  Filled 2019-01-26 (×2): qty 5

## 2019-01-26 MED ORDER — PROPOFOL 10 MG/ML IV BOLUS
INTRAVENOUS | Status: AC
  Filled 2019-01-26: qty 20

## 2019-01-26 MED ORDER — SODIUM CHLORIDE 0.9 % IV SOLN
INTRAVENOUS | Status: DC | PRN
  Administered 2019-01-26: 20 ug/min via INTRAVENOUS

## 2019-01-26 MED ORDER — PHENYLEPHRINE 40 MCG/ML (10ML) SYRINGE FOR IV PUSH (FOR BLOOD PRESSURE SUPPORT)
PREFILLED_SYRINGE | INTRAVENOUS | Status: AC
  Filled 2019-01-26: qty 10

## 2019-01-26 MED ORDER — ACETAMINOPHEN 500 MG PO TABS
1000.0000 mg | ORAL_TABLET | Freq: Once | ORAL | Status: AC
  Administered 2019-01-26: 1000 mg via ORAL
  Filled 2019-01-26: qty 2

## 2019-01-26 MED ORDER — FENTANYL CITRATE (PF) 250 MCG/5ML IJ SOLN
INTRAMUSCULAR | Status: AC
  Filled 2019-01-26: qty 5

## 2019-01-26 MED ORDER — ALBUTEROL SULFATE (2.5 MG/3ML) 0.083% IN NEBU: 3.0000 mL | INHALATION_SOLUTION | RESPIRATORY_TRACT | Status: DC | PRN

## 2019-01-26 MED ORDER — EPHEDRINE SULFATE 50 MG/ML IJ SOLN
INTRAMUSCULAR | Status: DC | PRN
  Administered 2019-01-26: 5 mg via INTRAVENOUS
  Administered 2019-01-26: 10 mg via INTRAVENOUS
  Administered 2019-01-26: 5 mg via INTRAVENOUS

## 2019-01-26 MED ORDER — ALVIMOPAN 12 MG PO CAPS
12.0000 mg | ORAL_CAPSULE | ORAL | Status: AC
  Administered 2019-01-26: 12 mg via ORAL
  Filled 2019-01-26: qty 1

## 2019-01-26 MED ORDER — KCL IN DEXTROSE-NACL 20-5-0.9 MEQ/L-%-% IV SOLN
INTRAVENOUS | Status: DC
  Administered 2019-01-26 – 2019-01-28 (×5): via INTRAVENOUS
  Filled 2019-01-26 (×7): qty 1000

## 2019-01-26 MED ORDER — PANTOPRAZOLE SODIUM 40 MG IV SOLR
40.0000 mg | Freq: Every day | INTRAVENOUS | Status: DC
  Administered 2019-01-26 – 2019-01-28 (×3): 40 mg via INTRAVENOUS
  Filled 2019-01-26 (×4): qty 40

## 2019-01-26 MED ORDER — METOPROLOL TARTRATE 5 MG/5ML IV SOLN
5.0000 mg | Freq: Three times a day (TID) | INTRAVENOUS | Status: DC
  Administered 2019-01-26 – 2019-01-28 (×6): 5 mg via INTRAVENOUS
  Filled 2019-01-26 (×6): qty 5

## 2019-01-26 MED ORDER — SUGAMMADEX SODIUM 200 MG/2ML IV SOLN
INTRAVENOUS | Status: DC | PRN
  Administered 2019-01-26: 200 mg via INTRAVENOUS

## 2019-01-26 MED ORDER — HEPARIN SODIUM (PORCINE) 5000 UNIT/ML IJ SOLN
5000.0000 [IU] | Freq: Three times a day (TID) | INTRAMUSCULAR | Status: DC
  Administered 2019-01-27 – 2019-01-30 (×9): 5000 [IU] via SUBCUTANEOUS
  Filled 2019-01-26 (×10): qty 1

## 2019-01-26 MED ORDER — HYDROMORPHONE HCL 1 MG/ML IJ SOLN
1.0000 mg | INTRAMUSCULAR | Status: DC | PRN
  Administered 2019-01-26: 0.25 mg via INTRAVENOUS

## 2019-01-26 MED ORDER — HYDROMORPHONE HCL 1 MG/ML IJ SOLN
INTRAMUSCULAR | Status: DC | PRN
  Administered 2019-01-26 (×2): 0.5 mg via INTRAVENOUS

## 2019-01-26 MED ORDER — PROCHLORPERAZINE MALEATE 10 MG PO TABS
10.0000 mg | ORAL_TABLET | Freq: Four times a day (QID) | ORAL | Status: DC | PRN
  Filled 2019-01-26: qty 1

## 2019-01-26 MED ORDER — MOMETASONE FURO-FORMOTEROL FUM 100-5 MCG/ACT IN AERO
2.0000 | INHALATION_SPRAY | Freq: Two times a day (BID) | RESPIRATORY_TRACT | Status: DC
  Administered 2019-01-26 – 2019-01-30 (×7): 2 via RESPIRATORY_TRACT
  Filled 2019-01-26 (×2): qty 8.8

## 2019-01-26 MED ORDER — GABAPENTIN 300 MG PO CAPS
300.0000 mg | ORAL_CAPSULE | ORAL | Status: AC
  Administered 2019-01-26: 300 mg via ORAL
  Filled 2019-01-26: qty 1

## 2019-01-26 MED ORDER — METHYLPREDNISOLONE SODIUM SUCC 40 MG IJ SOLR
40.0000 mg | Freq: Every day | INTRAMUSCULAR | Status: DC
  Administered 2019-01-26 – 2019-01-27 (×2): 40 mg via INTRAVENOUS
  Filled 2019-01-26 (×3): qty 1

## 2019-01-26 MED ORDER — ROCURONIUM BROMIDE 10 MG/ML (PF) SYRINGE
PREFILLED_SYRINGE | INTRAVENOUS | Status: AC
  Filled 2019-01-26: qty 30

## 2019-01-26 MED ORDER — HYDROMORPHONE HCL 1 MG/ML IJ SOLN
INTRAMUSCULAR | Status: AC
  Filled 2019-01-26: qty 1

## 2019-01-26 MED ORDER — FENTANYL CITRATE (PF) 100 MCG/2ML IJ SOLN
25.0000 ug | INTRAMUSCULAR | Status: DC | PRN
  Administered 2019-01-26 – 2019-01-27 (×7): 50 ug via INTRAVENOUS
  Filled 2019-01-26 (×8): qty 2

## 2019-01-26 MED ORDER — METOCLOPRAMIDE HCL 5 MG/ML IJ SOLN
INTRAMUSCULAR | Status: AC
  Administered 2019-01-26: 10 mg
  Filled 2019-01-26: qty 2

## 2019-01-26 MED ORDER — SODIUM CHLORIDE 0.9 % IR SOLN
Status: DC | PRN
  Administered 2019-01-26: 1000 mL

## 2019-01-26 MED ORDER — CELECOXIB 200 MG PO CAPS
200.0000 mg | ORAL_CAPSULE | ORAL | Status: AC
  Administered 2019-01-26: 200 mg via ORAL
  Filled 2019-01-26: qty 1

## 2019-01-26 MED ORDER — DEXMEDETOMIDINE HCL 200 MCG/2ML IV SOLN
INTRAVENOUS | Status: DC | PRN
  Administered 2019-01-26: 12 ug via INTRAVENOUS

## 2019-01-26 SURGICAL SUPPLY — 53 items
BLADE CLIPPER SURG (BLADE) IMPLANT
BNDG GAUZE ELAST 4 BULKY (GAUZE/BANDAGES/DRESSINGS) ×1 IMPLANT
CANISTER SUCT 3000ML PPV (MISCELLANEOUS) ×2 IMPLANT
CELLS DAT CNTRL 66122 CELL SVR (MISCELLANEOUS) ×1 IMPLANT
COVER SURGICAL LIGHT HANDLE (MISCELLANEOUS) ×4 IMPLANT
COVER WAND RF STERILE (DRAPES) ×1 IMPLANT
DRSG OPSITE POSTOP 4X10 (GAUZE/BANDAGES/DRESSINGS) IMPLANT
DRSG OPSITE POSTOP 4X8 (GAUZE/BANDAGES/DRESSINGS) IMPLANT
DRSG TEGADERM 2-3/8X2-3/4 SM (GAUZE/BANDAGES/DRESSINGS) ×4 IMPLANT
DRSG VAC ATS SM SENSATRAC (GAUZE/BANDAGES/DRESSINGS) ×1 IMPLANT
DRSG XEROFORM 1X8 (GAUZE/BANDAGES/DRESSINGS) ×1 IMPLANT
ELECT CAUTERY BLADE 6.4 (BLADE) ×2 IMPLANT
ELECT REM PT RETURN 9FT ADLT (ELECTROSURGICAL) ×2
ELECTRODE REM PT RTRN 9FT ADLT (ELECTROSURGICAL) ×1 IMPLANT
GAUZE SPONGE 2X2 8PLY STRL LF (GAUZE/BANDAGES/DRESSINGS) IMPLANT
GAUZE SPONGE 4X4 16PLY XRAY LF (GAUZE/BANDAGES/DRESSINGS) ×1 IMPLANT
GLOVE BIO SURGEON STRL SZ7.5 (GLOVE) ×3 IMPLANT
GLOVE BIOGEL PI IND STRL 6.5 (GLOVE) IMPLANT
GLOVE BIOGEL PI INDICATOR 6.5 (GLOVE) ×1
GLOVE ECLIPSE 6.5 STRL STRAW (GLOVE) ×2 IMPLANT
GOWN STRL REUS W/ TWL LRG LVL3 (GOWN DISPOSABLE) ×3 IMPLANT
GOWN STRL REUS W/TWL LRG LVL3 (GOWN DISPOSABLE) ×10
KIT DRSG PREVENA PLUS 7DAY 125 (MISCELLANEOUS) ×1 IMPLANT
KIT PREVENA INCISION MGT20CM45 (CANNISTER) ×1 IMPLANT
KIT TURNOVER KIT B (KITS) ×2 IMPLANT
NS IRRIG 1000ML POUR BTL (IV SOLUTION) ×4 IMPLANT
PACK COLON (CUSTOM PROCEDURE TRAY) ×2 IMPLANT
PAD ARMBOARD 7.5X6 YLW CONV (MISCELLANEOUS) ×2 IMPLANT
PENCIL SMOKE EVACUATOR (MISCELLANEOUS) ×2 IMPLANT
RETRACTOR WND ALEXIS 18 MED (MISCELLANEOUS) IMPLANT
RTRCTR WOUND ALEXIS 18CM MED (MISCELLANEOUS) ×2
SCISSORS LAP 5X35 DISP (ENDOMECHANICALS) ×1 IMPLANT
SLEEVE ENDOPATH XCEL 5M (ENDOMECHANICALS) ×2 IMPLANT
SLEEVE SUCTION CATH 165 (SLEEVE) ×1 IMPLANT
SPECIMEN JAR MEDIUM (MISCELLANEOUS) IMPLANT
SPONGE GAUZE 2X2 STER 10/PKG (GAUZE/BANDAGES/DRESSINGS) ×1
SPONGE LAP 18X18 RF (DISPOSABLE) ×4 IMPLANT
STAPLER CIRC ILS CVD 25MM (STAPLE) ×1 IMPLANT
STAPLER VISISTAT 35W (STAPLE) ×2 IMPLANT
SUT NOVA NAB DX-16 0-1 5-0 T12 (SUTURE) ×4 IMPLANT
SUT PDS AB 1 TP1 96 (SUTURE) ×4 IMPLANT
SUT PROLENE 2 0 KS (SUTURE) IMPLANT
SUT PROLENE 2 0 SH 30 (SUTURE) IMPLANT
SUT SILK 2 0 SH CR/8 (SUTURE) ×2 IMPLANT
SUT SILK 2 0 TIES 10X30 (SUTURE) ×2 IMPLANT
SUT SILK 3 0 SH CR/8 (SUTURE) ×2 IMPLANT
SUT SILK 3 0 TIES 10X30 (SUTURE) ×2 IMPLANT
TOWEL GREEN STERILE FF (TOWEL DISPOSABLE) ×2 IMPLANT
TRAY FOLEY MTR SLVR 14FR STAT (SET/KITS/TRAYS/PACK) ×2 IMPLANT
TRAY PROCTOSCOPIC FIBER OPTIC (SET/KITS/TRAYS/PACK) IMPLANT
TROCAR XCEL NON-BLD 5MMX100MML (ENDOMECHANICALS) ×1 IMPLANT
TUBE CONNECTING 20X1/4 (TUBING) ×2 IMPLANT
UNDERPAD 30X30 (UNDERPADS AND DIAPERS) IMPLANT

## 2019-01-26 NOTE — Transfer of Care (Signed)
Immediate Anesthesia Transfer of Care Note  Patient: [name]  Procedure(s) Performed: LAPAROSCOPIC ASSISTED COLOSTOMY TAKEDOWN (N/A Abdomen) LAPAROSCOPIC LYSIS OF ADHESIONS (N/A Abdomen)  Patient Location: PACU  Anesthesia Type:General  Level of Consciousness: awake, alert  and oriented  Airway & Oxygen Therapy: Patient Spontanous Breathing and Patient connected to face mask oxygen  Post-op Assessment: Report given to RN and Post -op Vital signs reviewed and stable  Post vital signs: Reviewed and stable  Last Vitals:  Vitals Value Taken Time  BP 98/63 01/26/19 1313  Temp    Pulse 86 01/26/19 1322  Resp 16 01/26/19 1322  SpO2 96 % 01/26/19 1322  Vitals shown include unvalidated device data.  Last Pain:  Vitals:   01/26/19 0800  TempSrc: Oral  PainSc:       Patients Stated Pain Goal: 2 ([id number])  Complications: No apparent anesthesia complications

## 2019-01-26 NOTE — H&P (Signed)
[name]  Location: [facility] Surgery Patient #: [id number] DOB: Mar 03, 1946 Married / Language: English / Race: White Male   History of Present Illness  The patient is a 73 year old male who presents for a follow-up for Abdominal pain. The patient is a 73 year old white male who is about 4 months status post sigmoid colectomy and colostomy for perforated sigmoid diverticulitis. He tolerated the surgery well. His midline wound is healed now. He denies any abdominal pain. He states it is been quite a few years since his last colonoscopy. Otherwise he is ready to have his ostomy reversed.   Allergies  Lidocaine HCl (Cardiac) *ANTIARRHYTHMICS*  Codeine Phosphate *ANALGESICS - OPIOID*  Crestor *ANTIHYPERLIPIDEMICS*  Statins  Percocet *ANALGESICS - OPIOID*  Allergies Reconciled   Medication History Tylenol (500MG  Capsule, Oral) Active. Cetirizine HCl (10MG  Tablet, Oral) Active. Flonase (50MCG/DOSE Inhaler, Nasal) Active. Tamsulosin HCl (0.4MG  Capsule, Oral) Active. dilTIAZem CD (120MG  Capsule ER 24HR, Oral) Active. Dexamethasone (Oral) Specific strength unknown - Active. Albuterol Sulfate HFA (108 (90 Base)MCG/ACT Aerosol Soln, Inhalation) Active. Eliquis (5MG  Tablet, Oral) Active. Mycophenolate Mofetil (500MG  Tablet, Oral) Active. Ofev (100MG  Capsule, Oral) Active. Ezetimibe (10MG  Tablet, Oral) Active. Gabapentin (300MG  Capsule, Oral) Active. Metoprolol Tartrate (25MG  Tablet, Oral) Active. Ondansetron HCl (4MG  Tablet, Oral) Active. Sulfamethoxazole-Trimethoprim (800-160MG  Tablet, Oral) Active. Symbicort (80-4.5MCG/ACT Aerosol, Inhalation) Active. Tamsulosin HCl (0.4MG  Capsule, Oral) Active. metroNIDAZOLE (500MG  Tablet, Oral) Active. Medications Reconciled    Review of Systems General Present- Appetite Loss, Fatigue and Weight Loss. Not Present- Chills, Fever, Night Sweats and Weight Gain. Skin Not Present- Change in Wart/Mole,  Dryness, Hives, Jaundice, New Lesions, Non-Healing Wounds, Rash and Ulcer. HEENT Present- Hoarseness and Wears glasses/contact lenses. Not Present- Earache, Hearing Loss, Nose Bleed, Oral Ulcers, Ringing in the Ears, Seasonal Allergies, Sinus Pain, Sore Throat, Visual Disturbances and Yellow Eyes. Respiratory Present- Difficulty Breathing. Not Present- Bloody sputum, Chronic Cough, Snoring and Wheezing. Breast Not Present- Breast Mass, Breast Pain, Nipple Discharge and Skin Changes. Cardiovascular Not Present- Chest Pain, Difficulty Breathing Lying Down, Leg Cramps, Palpitations, Rapid Heart Rate, Shortness of Breath and Swelling of Extremities. Gastrointestinal Present- Bloating. Not Present- Abdominal Pain, Bloody Stool, Change in Bowel Habits, Chronic diarrhea, Constipation, Difficulty Swallowing, Excessive gas, Gets full quickly at meals, Hemorrhoids, Indigestion, Nausea, Rectal Pain and Vomiting. Male Genitourinary Not Present- Blood in Urine, Change in Urinary Stream, Frequency, Impotence, Nocturia, Painful Urination, Urgency and Urine Leakage. Musculoskeletal Not Present- Back Pain, Joint Pain, Joint Stiffness, Muscle Pain, Muscle Weakness and Swelling of Extremities. Neurological Present- Weakness. Not Present- Decreased Memory, Fainting, Headaches, Numbness, Seizures, Tingling, Tremor and Trouble walking. Psychiatric Not Present- Anxiety, Bipolar, Change in Sleep Pattern, Depression, Fearful and Frequent crying. Endocrine Present- Cold Intolerance. Not Present- Excessive Hunger, Hair Changes, Heat Intolerance and New Diabetes. Hematology Present- Blood Thinners and Easy Bruising. Not Present- Excessive bleeding, Gland problems, HIV and Persistent Infections.  Vitals  Weight: 179 lb Height: 69in Body Surface Area: 1.97 m Body Mass Index: 26.43 kg/m  Temp.: 96.35F (Temporal)  Pulse: 93 (Regular)  BP: 152/88(Sitting, Left Arm, Standard)       Physical Exam   General Mental Status-Alert. General Appearance-Consistent with stated age. Hydration-Well hydrated. Voice-Normal.  Head and Neck Head-normocephalic, atraumatic with no lesions or palpable masses. Trachea-midline. Thyroid Gland Characteristics - normal size and consistency.  Eye Eyeball - Bilateral-Extraocular movements intact. Sclera/Conjunctiva - Bilateral-No scleral icterus.  Chest and Lung Exam Chest and lung exam reveals -quiet, even and easy respiratory effort with no use of accessory muscles  and on auscultation, normal breath sounds, no adventitious sounds and normal vocal resonance. Inspection Chest Wall - Normal. Back - normal.  Cardiovascular Cardiovascular examination reveals -normal heart sounds, regular rate and rhythm with no murmurs and normal pedal pulses bilaterally.  Abdomen Note: The abdomen is soft and nontender. The midline wound has healed. The ostomy is pink and productive.   Neurologic Neurologic evaluation reveals -alert and oriented x 3 with no impairment of recent or remote memory. Mental Status-Normal.  Musculoskeletal Normal Exam - Left-Upper Extremity Strength Normal and Lower Extremity Strength Normal. Normal Exam - Right-Upper Extremity Strength Normal and Lower Extremity Strength Normal.  Lymphatic Head & Neck  General Head & Neck Lymphatics: Bilateral - Description - Normal. Axillary  General Axillary Region: Bilateral - Description - Normal. Tenderness - Non Tender. Femoral & Inguinal  Generalized Femoral & Inguinal Lymphatics: Bilateral - Description - Normal. Tenderness - Non Tender.    Assessment & Plan   DIVERTICULITIS OF LARGE INTESTINE WITH PERFORATION WITHOUT BLEEDING (K57.20) Impression: The patient is about 4 months status post sigmoid colectomy with colostomy for perforated diverticulitis. He has done well since surgery. He will be ready soon for colostomy reversal. He will need the rest of  his colon evaluated prior to surgery so we will refer him to [facility] gastroenterology for this. I have discussed with him in detail the risks and benefits of the operation as well as some technical aspects including the risk of leak and he understands and wishes to proceed. I will plan for a laparoscopic assisted colostomy reversal. He will also need cardiac clearance from his cardiologist Dr. Claiborne Billings and will need to be off blood thinners for probably 3 days.  Current Plans Referred to Gastroenterology, for evaluation and follow up Physiological scientist). Routine.

## 2019-01-26 NOTE — Progress Notes (Signed)
Lower laparoscopic site bleeding.  Soaked through to Geographical information systems officer.  Notified Dr. Marlou Starks.  Orders to hold pressure to site for 20 minutes and change dressing.

## 2019-01-26 NOTE — Op Note (Signed)
01/26/2019  12:55 PM  PATIENT:  [name]  73 y.o. male  PRE-OPERATIVE DIAGNOSIS:  COLOSTOMY  POST-OPERATIVE DIAGNOSIS:  COLOSTOMY  PROCEDURE:  Procedure(s): LAPAROSCOPIC ASSISTED COLOSTOMY TAKEDOWN (N/A) LAPAROSCOPIC LYSIS OF ADHESIONS (N/A) PARTIAL COLECTOMY MOBILIZATION OF SPLENIC FLEXURE  SURGEON:  Surgeon(s) and Role:    * [name], MD - Primary    * Fanny Skates, MD - Assisting  PHYSICIAN ASSISTANT:   ASSISTANTS: Dr. Dalbert Batman   ANESTHESIA:   general  EBL:  100 mL   BLOOD ADMINISTERED:none  DRAINS: none   LOCAL MEDICATIONS USED:  NONE  SPECIMEN:  Source of Specimen:  colostomy  DISPOSITION OF SPECIMEN:  PATHOLOGY  COUNTS:  YES  TOURNIQUET:  * No tourniquets in log *  DICTATION: .Dragon Dictation   After informed consent was obtained the patient was brought to the operating room and placed in the supine position on the operating table.  After adequate induction of general anesthesia the patient was moved in lithotomy position and all pressure points were padded.  An appropriate timeout was performed.  The ostomy was closed with a 2-0 silk pursestring stitch.  The abdomen was then prepped with ChloraPrep and Betadine, allowed to dry, and draped in usual sterile manner.  Initially the abdominal cavity was accessed through a small stab incision in the right upper quadrant.  A 5 mm Optiview port and camera were used to bluntly dissected the layers of the abdominal wall under direct vision until access was gained to the abdominal cavity.  The abdomen was then insufflated with carbon dioxide without difficulty.  Another 5 mm port was placed in the right mid abdomen under direct vision.  There were significant adhesions of small bowel and omentum to the anterior abdominal wall.  Most of this was taken down sharply with laparoscopic scissors.  There were multiple loops of small bowel adherent around the ostomy and in the left paracolic gutter to the point  where we decided at this point to stop the laparoscopy and I made a midline incision with a 10 blade knife incorporating the previous scar.  The incision was carried through the skin and subcutaneous tissue sharply with electrocautery until the fascia of the anterior abdominal wall was encountered.  The fascia was opened sharply with the electrocautery gaining access to the abdominal cavity and the rest of the incision was opened under direct vision with the electrocautery.  A Balfour retractor was deployed.  There were multiple interloop small bowel adhesions that were taken down by a combination of blunt finger dissection and some sharp dissection with Metzenbaum scissors.  Once the entire small bowel was freed up then I was able to direct attention to the ostomy.  The ostomy was mobilized from the inside by dissecting some of the plane between the ostomy and the abdominal wall.  Next we directed our attention to the pelvis.  The loops of small bowel were brought up out of the pelvis after extensive lysis of adhesions and we were able to identify the rectal stump.  The rectal stump appeared to be healthy and mobile.  At this point a transversely oriented elliptical incision was made around the ostomy with a 15 blade knife.  The incision was carried through the skin and subcutaneous tissue sharply with the electrocautery until the dissection reached the dissection plane from the inside.  At this point the ostomy was completely mobilized and brought back into the abdominal cavity.  Unfortunately the ostomy did not seem to quite  reach to the rectal stump so we did have to mobilize the splenic flexure of the colon.  We initially dissected the retroperitoneal attachment along the white line of Toldt.  We then dissected the omentum away from the transverse colon and carried the dissection out until the splenic flexure was completely mobilized and able to be brought down into the abdominal cavity further.  This gave [place]  plenty of mobilization of the distal sigmoid to reach down to the rectum.  The last few centimeters of the colostomy were removed sharply between [facility] clamps.  The mesentery was taken down by clamping with hemostats dividing and ligating with 2-0 silk ties.  Next the distal colon was opened by removing the Allen clamp.  The distal colon was sized with the EEA sizers and the colon appeared to be relatively small so only a 25 mm sizer would work.  A 2-0 Prolene pursestring stitch was placed around the edge of the proximal colon.  The anvil was placed in the lumen of the colon and the Prolene stitch was cinched down and tied around the post.  Next my assistant Dr. Dalbert Batman went below and performed a rigid sigmoidoscopy.  He was able to bring the stapler up to the anterior wall of the rectal stump and deployed the spike.  The anvil was then connected to the spike and the stapling device was closed into the green zone and held for a minute.  The device was then clamped and fired thereby creating a nice widely patent anastomosis between the distal left colon and rectal stump.  The staple line was reinforced with multiple interrupted 2-0 silk stitches.  Next a soft bowel clamp was placed above the anastomosis and the pelvis was filled with saline.  Another rigid sigmoidoscopy was performed and with insufflation of the anastomosis there was no evidence of leak.  The rectum was then evacuated.  The abdomen was irrigated with copious amounts of saline.  No other abnormalities were noted and everything appeared to be hemostatic.  At this point all gowns gloves and drapes were changed.  Next the fascia at the ostomy site was closed with 2 layers of #1 Novafil stitches.  The fascia of the anterior abdominal wall was then closed with 2 running #1 double-stranded looped PDS sutures.  The subcutaneous tissue was irrigated with copious amounts of saline.  The 2 incisions were closed with staples and a Provena suction dressing was  applied.  The patient tolerated the procedure well.  At the end of the case all needle sponge and instrument counts were correct.  The patient was then awakened and taken to recovery in stable condition.  The assistant was essential for visualization and retraction for completion of the case.  PLAN OF CARE: Admit to inpatient   PATIENT DISPOSITION:  PACU - hemodynamically stable.   Delay start of Pharmacological VTE agent (>24hrs) due to surgical blood loss or risk of bleeding: no

## 2019-01-26 NOTE — Anesthesia Procedure Notes (Signed)
Procedure Name: Intubation Date/Time: 01/26/2019 9:31 AM Performed by: Babs Bertin, CRNA Pre-anesthesia Checklist: Patient identified, Emergency Drugs available, Suction available and Patient being monitored Patient Re-evaluated:Patient Re-evaluated prior to induction Oxygen Delivery Method: Circle System Utilized Preoxygenation: Pre-oxygenation with 100% oxygen Induction Type: IV induction Ventilation: Mask ventilation without difficulty Laryngoscope Size: Mac and 3 Grade View: Grade II Tube type: Oral Tube size: 7.5 mm Number of attempts: 1 Airway Equipment and Method: Stylet and Oral airway Placement Confirmation: ETT inserted through vocal cords under direct vision,  positive ETCO2 and breath sounds checked- equal and bilateral Secured at: 22 cm Tube secured with: Tape Dental Injury: Teeth and Oropharynx as per pre-operative assessment

## 2019-01-26 NOTE — Anesthesia Postprocedure Evaluation (Signed)
Anesthesia Post Note  Patient: [name]  Procedure(s) Performed: LAPAROSCOPIC ASSISTED COLOSTOMY TAKEDOWN (N/A Abdomen) LAPAROSCOPIC LYSIS OF ADHESIONS (N/A Abdomen)     Patient location during evaluation: PACU Anesthesia Type: General Level of consciousness: awake and alert Pain management: pain level controlled Vital Signs Assessment: post-procedure vital signs reviewed and stable Respiratory status: spontaneous breathing, nonlabored ventilation, respiratory function stable and patient connected to nasal cannula oxygen Cardiovascular status: blood pressure returned to baseline and stable Postop Assessment: no apparent nausea or vomiting Anesthetic complications: no    Last Vitals:  Vitals:   01/26/19 1458 01/26/19 1500  BP:  112/61  Pulse: 85 89  Resp: (!) 22 20  Temp:    SpO2: 98% 98%    Last Pain:  Vitals:   01/26/19 1345  TempSrc:   PainSc: 10-Worst pain ever                 Shaletta Hinostroza,W. EDMOND

## 2019-01-26 NOTE — Interval H&P Note (Signed)
History and Physical Interval Note:  01/26/2019 8:50 AM  [name]  has presented today for surgery, with the diagnosis of COLOSTOMY.  The various methods of treatment have been discussed with the patient and family. After consideration of risks, benefits and other options for treatment, the patient has consented to  Procedure(s): LAPAROSCOPIC ASSISTED COLOSTOMY TAKEDOWN (N/A) as a surgical intervention.  The patient's history has been reviewed, patient examined, no change in status, stable for surgery.  I have reviewed the patient's chart and labs.  Questions were answered to the patient's satisfaction.     [name]

## 2019-01-27 ENCOUNTER — Encounter (HOSPITAL_COMMUNITY): Payer: Self-pay | Admitting: General Surgery

## 2019-01-27 LAB — BASIC METABOLIC PANEL
Anion gap: 12 (ref 5–15)
BUN: 5 mg/dL — ABNORMAL LOW (ref 8–23)
CO2: 24 mmol/L (ref 22–32)
Calcium: 8.1 mg/dL — ABNORMAL LOW (ref 8.9–10.3)
Chloride: 105 mmol/L (ref 98–111)
Creatinine, Ser: 1.02 mg/dL (ref 0.61–1.24)
GFR calc Af Amer: >60 mL/min (ref >60)
GFR calc non Af Amer: >60 mL/min (ref >60)
Glucose, Bld: 180 mg/dL — ABNORMAL HIGH (ref 70–99)
Potassium: 4.7 mmol/L (ref 3.5–5.1)
Sodium: 141 mmol/L (ref 135–145)

## 2019-01-27 LAB — CBC
HCT: 40 % (ref 39.0–52.0)
Hemoglobin: 14.2 g/dL (ref 13.0–17.0)
MCH: 38 pg — ABNORMAL HIGH (ref 26.0–34.0)
MCHC: 35.5 g/dL (ref 30.0–36.0)
MCV: 107 fL — ABNORMAL HIGH (ref 80.0–100.0)
Platelets: 187 10*3/uL (ref 150–400)
RBC: 3.74 MIL/uL — ABNORMAL LOW (ref 4.22–5.81)
RDW: 13.6 % (ref 11.5–15.5)
WBC: 20 10*3/uL — ABNORMAL HIGH (ref 4.0–10.5)
nRBC: 0 % (ref 0.0–0.2)

## 2019-01-27 MED ORDER — ALVIMOPAN 12 MG PO CAPS
12.0000 mg | ORAL_CAPSULE | Freq: Two times a day (BID) | ORAL | Status: DC
  Administered 2019-01-27 (×2): 12 mg via ORAL
  Filled 2019-01-27 (×3): qty 1

## 2019-01-27 NOTE — Plan of Care (Signed)

## 2019-01-27 NOTE — Plan of Care (Signed)
  Problem: Pain Managment: Goal: General experience of comfort will improve Outcome: Progressing   Problem: Safety: Goal: Ability to remain free from injury will improve Outcome: Progressing   Problem: Skin Integrity: Goal: Risk for impaired skin integrity will decrease Outcome: Progressing   

## 2019-01-27 NOTE — Progress Notes (Signed)
1 Day Post-Op   Subjective/Chief Complaint: No complaints   Objective: Vital signs in last 24 hours: Temp:  [96.8 F (36 C)-98.7 F (37.1 C)] 98.7 F (37.1 C) (09/15 0517) Pulse Rate:  [80-98] 84 (09/15 0517) Resp:  [13-25] 17 (09/15 0517) BP: (98-121)/(61-82) 115/67 (09/15 0517) SpO2:  [91 %-98 %] 95 % (09/15 0517) Last BM Date: 01/26/19  Intake/Output from previous day: 09/14 0701 - 09/15 0700 In: 4150 [I.V.:4100; IV Piggyback:50] Out: 2150 [Urine:2050; Blood:100] Intake/Output this shift: No intake/output data recorded.  General appearance: alert and cooperative Resp: clear to auscultation bilaterally Cardio: regular rate and rhythm GI: soft, mild tenderness. few bs  Lab Results:  Recent Labs    01/27/19 0105  WBC 20.0*  HGB 14.2  HCT 40.0  PLT 187   BMET Recent Labs    01/27/19 0105  NA 141  K 4.7  CL 105  CO2 24  GLUCOSE 180*  BUN 5*  CREATININE 1.02  CALCIUM 8.1*   PT/INR Recent Labs    01/26/19 0725  LABPROT 12.2  INR 0.9   ABG No results for input(s): PHART, HCO3 in the last 72 hours.  Invalid input(s): PCO2, PO2  Studies/Results: No results found.  Anti-infectives: Anti-infectives (From admission, onward)   Start     Dose/Rate Route Frequency Ordered Stop   01/26/19 0730  cefoTEtan (CEFOTAN) 2 g in sodium chloride 0.9 % 100 mL IVPB     2 g 200 mL/hr over 30 Minutes Intravenous On call to O.R. 01/26/19 0723 01/26/19 1004      Assessment/Plan: s/p Procedure(s): LAPAROSCOPIC ASSISTED COLOSTOMY TAKEDOWN (N/A) LAPAROSCOPIC LYSIS OF ADHESIONS (N/A) Advance diet. Allow clears Continue entereg Ambulate D/c foley Pod 1  LOS: 1 day    [name] 01/27/2019

## 2019-01-28 MED ORDER — METOPROLOL TARTRATE 25 MG PO TABS
25.0000 mg | ORAL_TABLET | Freq: Two times a day (BID) | ORAL | Status: DC
  Administered 2019-01-28 – 2019-01-30 (×5): 25 mg via ORAL
  Filled 2019-01-28 (×5): qty 1

## 2019-01-28 MED ORDER — MYCOPHENOLATE MOFETIL 250 MG PO CAPS
500.0000 mg | ORAL_CAPSULE | Freq: Two times a day (BID) | ORAL | Status: DC
  Filled 2019-01-28 (×6): qty 2

## 2019-01-28 MED ORDER — GABAPENTIN 300 MG PO CAPS
300.0000 mg | ORAL_CAPSULE | Freq: Two times a day (BID) | ORAL | Status: DC
  Administered 2019-01-28 – 2019-01-30 (×5): 300 mg via ORAL
  Filled 2019-01-28 (×5): qty 1

## 2019-01-28 MED ORDER — SULFAMETHOXAZOLE-TRIMETHOPRIM 800-160 MG PO TABS
1.0000 | ORAL_TABLET | ORAL | Status: DC
  Administered 2019-01-28 – 2019-01-30 (×2): 1 via ORAL
  Filled 2019-01-28 (×2): qty 1

## 2019-01-28 MED ORDER — TAMSULOSIN HCL 0.4 MG PO CAPS
0.4000 mg | ORAL_CAPSULE | Freq: Two times a day (BID) | ORAL | Status: DC
  Administered 2019-01-28 – 2019-01-30 (×4): 0.4 mg via ORAL
  Filled 2019-01-28 (×4): qty 1

## 2019-01-28 MED ORDER — TAMSULOSIN HCL 0.4 MG PO CAPS
0.4000 mg | ORAL_CAPSULE | Freq: Every day | ORAL | Status: DC
  Administered 2019-01-28: 0.4 mg via ORAL
  Filled 2019-01-28: qty 1

## 2019-01-28 MED ORDER — PREDNISONE 5 MG PO TABS
8.0000 mg | ORAL_TABLET | Freq: Every day | ORAL | Status: DC
  Administered 2019-01-29 – 2019-01-30 (×2): 8 mg via ORAL
  Filled 2019-01-28 (×2): qty 3

## 2019-01-28 MED ORDER — DILTIAZEM HCL ER COATED BEADS 120 MG PO CP24
120.0000 mg | ORAL_CAPSULE | Freq: Every day | ORAL | Status: DC
  Administered 2019-01-28 – 2019-01-30 (×3): 120 mg via ORAL
  Filled 2019-01-28 (×3): qty 1

## 2019-01-28 MED ORDER — EZETIMIBE 10 MG PO TABS
10.0000 mg | ORAL_TABLET | Freq: Every day | ORAL | Status: DC
  Administered 2019-01-28 – 2019-01-29 (×2): 10 mg via ORAL
  Filled 2019-01-28 (×2): qty 1

## 2019-01-28 NOTE — Progress Notes (Addendum)
2 Days Post-Op   Subjective/Chief Complaint: No complaints. Having bm's   Objective: Vital signs in last 24 hours: Temp:  [97.7 F (36.5 C)-99.2 F (37.3 C)] 98.2 F (36.8 C) (09/16 0440) Pulse Rate:  [74-91] 89 (09/16 0850) Resp:  [16-18] 16 (09/16 0850) BP: (127-155)/(80-92) 155/92 (09/16 0440) SpO2:  [93 %-96 %] 94 % (09/16 0850) Last BM Date: 01/26/19  Intake/Output from previous day: 09/15 0701 - 09/16 0700 In: 1068.7 [I.V.:1068.7] Out: 825 [Urine:825] Intake/Output this shift: Total I/O In: -  Out: 800 [Urine:800]  General appearance: alert and cooperative Resp: clear to auscultation bilaterally Cardio: regular rate and rhythm GI: soft, nontender. good bs  Lab Results:  Recent Labs    01/27/19 0105  WBC 20.0*  HGB 14.2  HCT 40.0  PLT 187   BMET Recent Labs    01/27/19 0105  NA 141  K 4.7  CL 105  CO2 24  GLUCOSE 180*  BUN 5*  CREATININE 1.02  CALCIUM 8.1*   PT/INR Recent Labs    01/26/19 0725  LABPROT 12.2  INR 0.9   ABG No results for input(s): PHART, HCO3 in the last 72 hours.  Invalid input(s): PCO2, PO2  Studies/Results: No results found.  Anti-infectives: Anti-infectives (From admission, onward)   Start     Dose/Rate Route Frequency Ordered Stop   01/26/19 0730  cefoTEtan (CEFOTAN) 2 g in sodium chloride 0.9 % 100 mL IVPB     2 g 200 mL/hr over 30 Minutes Intravenous On call to O.R. 01/26/19 0723 01/26/19 1004      Assessment/Plan: s/p Procedure(s): LAPAROSCOPIC ASSISTED COLOSTOMY TAKEDOWN (N/A) LAPAROSCOPIC LYSIS OF ADHESIONS (N/A) Advance diet. Allow fulls Restart home meds Continue vac dressing Ambulate Continue entereg Pod 2  LOS: 2 days    [name] 01/28/2019

## 2019-01-28 NOTE — TOC Initial Note (Signed)
Transition of Care [facility]) - Initial/Assessment Note    Patient Details  Name: [name] MRN: [id number] Date of Birth: 06-11-1945  Transition of Care [facility]) CM/SW Contact:    [name], [place] Phone Number: 01/28/2019, 3:47 PM  Clinical Narrative:                 CSW completed telephonic assessment with pt wife [name]; introduced self, role, reason for call. Pt from home with wife (he does not have a court appointed legal guardian, will dc that from chart). Confirmed address and pt PCP. Pt wife agreeable to appointment with PCP to be made. Pt has had [facility] before when he discharged with colostomy. If pt goes home with wound care/wound vac pt wife would prefer a visit from a [facility] if possible. Will follow for this need. Pt wife had questions about if pt would dc with wound vac which CSW encouraged her to f/u with surgeon about that.   Pt has no issues with transportation or obtaining medications. Appointment made with Dr. Juel Burrow office (Dr. [place] Crane is booked for the next two weeks so he has an appointment on Sept 22nd with [name], NP at the office).   Continue to follow.   Expected Discharge Plan: [place] Barriers to Discharge: Continued Medical Work up   Patient Goals and CMS Choice Patient states their goals for this hospitalization and ongoing recovery are:: to be able to manage the post discharge wound care at home CMS Medicare.gov Compare Post Acute Care list provided to:: Patient Represenative (must comment)(pt spouse) Choice offered to / list presented to : Spouse  Expected Discharge Plan and Services Expected Discharge Plan: [place] In-house Referral: Clinical Social Work Discharge Planning Services: CM Consult Post Acute Care Choice: [place] arrangements for the past 2 months: Single Family Home HH Arranged: RN  Prior Living Arrangements/Services Living arrangements for the past 2 months: Single Family  Home Lives with:: Spouse Patient language and need for interpreter reviewed:: Yes(no needs) Do you feel safe going back to the place where you live?: Yes      Need for Family Participation in Patient Care: Yes (Comment)(assistance as needed with wound care) Care giver support system in place?: Yes (comment)(pt spouse) Current home services: DME Criminal Activity/Legal Involvement Pertinent to Current Situation/Hospitalization: No - Comment as needed  Activities of Daily Living  Permission Sought/Granted Permission sought to share information with : Case Manager, Family Supports Permission granted to share information with : Yes, Release of Information Signed  Share Information with NAME: [name]  Permission granted to share info w AGENCY: [place] if needed  Permission granted to share info w Relationship: wife  Permission granted to share info w Contact Information: [phone]  Emotional Assessment Appearance:: Other (Comment Required(telephonic assessment) Attitude/Demeanor/Rapport: (telephonic assessment) Affect (typically observed): (telephonic assessment) Orientation: : Oriented to Self, Oriented to Place, Oriented to  Time, Oriented to Situation Alcohol / Substance Use: Alcohol Use Psych Involvement: No (comment)  Admission diagnosis:  COLOSTOMY Patient Active Problem List   Diagnosis Date Noted  . Colostomy in place [facility]) 01/26/2019  . Prolonged Q-T interval on ECG 06/25/2018  . 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 Sigmoid colon perforation s/p Hartmann colectomy/colostomy 06/15/2018 06/15/2018  . Perforated diverticulum of large intestine 06/15/2018  . OSA (obstructive sleep apnea) 12/13/2016  . Snoring 10/26/2016  . PAF (paroxysmal atrial fibrillation) ([place]) 08/14/2016  . ILD (interstitial lung disease) ([place])  05/01/2016  . Lumbar stenosis 02/01/2015  . Giardia 04/14/2014  . H/O viral cardiomyopathy 04/14/2014  . Herniated nucleus pulposus,  L3-4 right 05/05/2013  . Other hypertrophic cardiomyopathy ([place]) 04/19/2013  . Hyperlipidemia 04/19/2013  . Essential hypertension 04/19/2013  . Abnormal LFTs 09/16/2012  . Nausea 07/10/2011  . Chronic diarrhea 07/10/2011  . Burping 07/10/2011  . Osteoporosis 07/10/2011  . Arthritis 07/10/2011   PCP:  [name], MD Pharmacy:   [place], [place] [address] Phone: [phone] Fax: [place], [place] [address] Phone: [phone] Fax: [phone]  [facility] - [place], [address] [place] [address] Phone: [phone] Fax: [phone]     Social Determinants of Health (SDOH) Interventions    Readmission Risk Interventions No flowsheet data found.

## 2019-01-29 ENCOUNTER — Encounter (HOSPITAL_COMMUNITY): Payer: Self-pay | Admitting: General Practice

## 2019-01-29 LAB — CBC WITH DIFFERENTIAL/PLATELET
Abs Immature Granulocytes: 0.04 10*3/uL (ref 0.00–0.07)
Basophils Absolute: 0 10*3/uL (ref 0.0–0.1)
Basophils Relative: 0 %
Eosinophils Absolute: 0 10*3/uL (ref 0.0–0.5)
Eosinophils Relative: 0 %
HCT: 37.5 % — ABNORMAL LOW (ref 39.0–52.0)
Hemoglobin: 13.4 g/dL (ref 13.0–17.0)
Immature Granulocytes: 0 %
Lymphocytes Relative: 17 %
Lymphs Abs: 2 10*3/uL (ref 0.7–4.0)
MCH: 37.9 pg — ABNORMAL HIGH (ref 26.0–34.0)
MCHC: 35.7 g/dL (ref 30.0–36.0)
MCV: 105.9 fL — ABNORMAL HIGH (ref 80.0–100.0)
Monocytes Absolute: 0.7 10*3/uL (ref 0.1–1.0)
Monocytes Relative: 6 %
Neutro Abs: 8.9 10*3/uL — ABNORMAL HIGH (ref 1.7–7.7)
Neutrophils Relative %: 77 %
Platelets: 180 10*3/uL (ref 150–400)
RBC: 3.54 MIL/uL — ABNORMAL LOW (ref 4.22–5.81)
RDW: 13.7 % (ref 11.5–15.5)
WBC: 11.6 10*3/uL — ABNORMAL HIGH (ref 4.0–10.5)
nRBC: 0 % (ref 0.0–0.2)

## 2019-01-29 LAB — BASIC METABOLIC PANEL
Anion gap: 4 — ABNORMAL LOW (ref 5–15)
BUN: 5 mg/dL — ABNORMAL LOW (ref 8–23)
CO2: 27 mmol/L (ref 22–32)
Calcium: 8.5 mg/dL — ABNORMAL LOW (ref 8.9–10.3)
Chloride: 109 mmol/L (ref 98–111)
Creatinine, Ser: 0.66 mg/dL (ref 0.61–1.24)
GFR calc Af Amer: >60 mL/min (ref >60)
GFR calc non Af Amer: >60 mL/min (ref >60)
Glucose, Bld: 103 mg/dL — ABNORMAL HIGH (ref 70–99)
Potassium: 3.6 mmol/L (ref 3.5–5.1)
Sodium: 140 mmol/L (ref 135–145)

## 2019-01-29 NOTE — Care Management Important Message (Signed)
Important Message  Patient Details  Name: [name] MRN: [id number] Date of Birth: [birth year]   Medicare Important Message Given:  Yes     Shelda Altes 01/29/2019, 4:04 PM

## 2019-01-29 NOTE — Progress Notes (Signed)
3 Days Post-Op   Subjective/Chief Complaint: No complaints   Objective: Vital signs in last 24 hours: Temp:  [97.4 F (36.3 C)-98.6 F (37 C)] 98.2 F (36.8 C) (09/17 1423) Pulse Rate:  [72-76] 75 (09/17 1423) Resp:  [13-19] 13 (09/17 1423) BP: (115-140)/(71-80) 118/76 (09/17 1423) SpO2:  [91 %-97 %] 97 % (09/17 1423) Last BM Date: 01/28/19  Intake/Output from previous day: 09/16 0701 - 09/17 0700 In: 1646.7 [P.O.:960; I.V.:586.7; IV Piggyback:100] Out: 1050 [Urine:1050] Intake/Output this shift: Total I/O In: 484.2 [I.V.:384.2; IV Piggyback:100] Out: -   General appearance: alert and cooperative Resp: clear to auscultation bilaterally Cardio: regular rate and rhythm GI: soft, nontender. vac in place  Lab Results:  Recent Labs    01/27/19 0105 01/29/19 0107  WBC 20.0* 11.6*  HGB 14.2 13.4  HCT 40.0 37.5*  PLT 187 180   BMET Recent Labs    01/27/19 0105 01/29/19 0107  NA 141 140  K 4.7 3.6  CL 105 109  CO2 24 27  GLUCOSE 180* 103*  BUN 5* 5*  CREATININE 1.02 0.66  CALCIUM 8.1* 8.5*   PT/INR No results for input(s): LABPROT, INR in the last 72 hours. ABG No results for input(s): PHART, HCO3 in the last 72 hours.  Invalid input(s): PCO2, PO2  Studies/Results: No results found.  Anti-infectives: Anti-infectives (From admission, onward)   Start     Dose/Rate Route Frequency Ordered Stop   01/28/19 2200  sulfamethoxazole-trimethoprim (BACTRIM DS) 800-160 MG per tablet 1 tablet     1 tablet Oral 2 times per day on Mon Wed Fri 01/28/19 1541     01/26/19 0730  cefoTEtan (CEFOTAN) 2 g in sodium chloride 0.9 % 100 mL IVPB     2 g 200 mL/hr over 30 Minutes Intravenous On call to O.R. 01/26/19 0723 01/26/19 1004      Assessment/Plan: s/p Procedure(s): LAPAROSCOPIC ASSISTED COLOSTOMY TAKEDOWN (N/A) LAPAROSCOPIC LYSIS OF ADHESIONS (N/A) Advance diet  Ambulate May be ready for d/c tomorrow  LOS: 3 days    [name] 01/29/2019

## 2019-01-30 ENCOUNTER — Telehealth: Payer: Self-pay | Admitting: Cardiovascular Disease

## 2019-01-30 MED ORDER — METHOCARBAMOL 750 MG PO TABS: 750.0000 mg | ORAL_TABLET | Freq: Four times a day (QID) | ORAL | 0 refills | Status: DC | PRN

## 2019-01-30 MED ORDER — TRAMADOL HCL 50 MG PO TABS: 50.0000 mg | ORAL_TABLET | Freq: Four times a day (QID) | ORAL | 0 refills | Status: DC | PRN

## 2019-01-30 NOTE — Telephone Encounter (Signed)
Spoke with patient - he just got home from hospital about 30 minutes ago.  Was given Lovenox injections in hospital post procedure and states he has bruises on both sides of abdomen.   Was not given any injections to take home or further instructions on Eliquis.  States no bleeding from surgical sites, all is healing well.    Advised that he restart Eliquis dose this evening.   Patient voiced understanding.

## 2019-01-30 NOTE — Progress Notes (Signed)
Pt will call Dr. West Bali to see when to restart his Eliquis.  All DC instructions given and reviewed.  Pts wife is a Marine scientist also.  Abd binder placed and 2 staples taken out and steristrips applied.  ABD placed over mid abd wound and 4x4 placed over old colostomy site.

## 2019-01-30 NOTE — Telephone Encounter (Signed)
New Message    Patient was taken off Eliquis for procedure and wants to know when he should start taking it again.

## 2019-01-30 NOTE — Progress Notes (Signed)
4 Days Post-Op   Subjective/Chief Complaint: No complaints   Objective: Vital signs in last 24 hours: Temp:  [98.2 F (36.8 C)-98.6 F (37 C)] 98.3 F (36.8 C) (09/18 0432) Pulse Rate:  [72-81] 81 (09/18 0432) Resp:  [13-18] 18 (09/18 0432) BP: (118-126)/(74-85) 123/85 (09/18 0432) SpO2:  [94 %-98 %] 98 % (09/18 0804) Last BM Date: 01/28/19  Intake/Output from previous day: 09/17 0701 - 09/18 0700 In: 919.5 [P.O.:50; I.V.:519.5; IV Piggyback:350] Out: -  Intake/Output this shift: No intake/output data recorded.  General appearance: alert and cooperative Resp: clear to auscultation bilaterally Cardio: regular rate and rhythm GI: soft, nontender. incisions look good  Lab Results:  Recent Labs    01/29/19 0107  WBC 11.6*  HGB 13.4  HCT 37.5*  PLT 180   BMET Recent Labs    01/29/19 0107  NA 140  K 3.6  CL 109  CO2 27  GLUCOSE 103*  BUN 5*  CREATININE 0.66  CALCIUM 8.5*   PT/INR No results for input(s): LABPROT, INR in the last 72 hours. ABG No results for input(s): PHART, HCO3 in the last 72 hours.  Invalid input(s): PCO2, PO2  Studies/Results: No results found.  Anti-infectives: Anti-infectives (From admission, onward)   Start     Dose/Rate Route Frequency Ordered Stop   01/28/19 2200  sulfamethoxazole-trimethoprim (BACTRIM DS) 800-160 MG per tablet 1 tablet     1 tablet Oral 2 times per day on Mon Wed Fri 01/28/19 1541     01/26/19 0730  cefoTEtan (CEFOTAN) 2 g in sodium chloride 0.9 % 100 mL IVPB     2 g 200 mL/hr over 30 Minutes Intravenous On call to O.R. 01/26/19 0723 01/26/19 1004      Assessment/Plan: s/p Procedure(s): LAPAROSCOPIC ASSISTED COLOSTOMY TAKEDOWN (N/A) LAPAROSCOPIC LYSIS OF ADHESIONS (N/A) Advance diet Discharge  LOS: 4 days    [name] 01/30/2019

## 2019-01-30 NOTE — TOC Transition Note (Signed)
Transition of Care [facility]) - CM/SW Discharge Note   Patient Details  Name: [name] MRN: [id number] Date of Birth: Jun 24, 1945  Transition of [facility]) CM/SW Contact:  Alexander Mt, [place] Phone Number: 01/30/2019, 12:09 PM   Clinical Narrative:    Pt does not meet criteria for [facility] per conversation with RNCM; pt plan to dc home with wife, PCP appt made. Pt wound vac will stay on until f/u appt with Dr. Marlou Starks.      Barriers to Discharge: Continued Medical Work up   Patient Goals and CMS Choice Patient states their goals for this hospitalization and ongoing recovery are:: to be able to manage the post discharge wound care at home CMS Medicare.gov Compare Post Acute Care list provided to:: Patient Represenative (must comment)(pt spouse) Choice offered to / list presented to : Spouse  Discharge Placement    Discharge Plan and Services In-house Referral: Clinical Social Work Discharge Planning Services: CM Consult  Social Determinants of Health (SDOH) Interventions     Readmission Risk Interventions No flowsheet data found.

## 2019-01-31 ENCOUNTER — Other Ambulatory Visit: Payer: Self-pay | Admitting: Pulmonary Disease

## 2019-02-02 NOTE — Discharge Summary (Signed)
Physician Discharge Summary  [name] MRN: [id number] DOB/AGE: Aug 01, 1945 73 y.o.  Admit date: 01/26/2019 Discharge date: 02/02/2019  Admission Diagnoses:  Discharge Diagnoses:  Active Problems:   Colostomy in place [facility])   Discharged Condition: good  Hospital Course: the pt underwent lap assisted colostomy reversal. He tolerated surgery well. Postop course was uneventful and he was discharged home on pod 4  Consults: None  Significant Diagnostic Studies: none  Treatments: surgery: as above  Discharge Exam: Blood pressure 123/85, pulse 81, temperature 98.3 F (36.8 C), temperature source Oral, resp. rate 18, height 5\' 9"  (1.753 m), weight 79.4 kg, SpO2 98 %. General appearance: alert and cooperative Resp: clear to auscultation bilaterally Cardio: regular rate and rhythm GI: soft, nontender. incisions look good  Disposition:   Discharge Instructions    Call MD for:  difficulty breathing, headache or visual disturbances   Complete by: As directed    Call MD for:  extreme fatigue   Complete by: As directed    Call MD for:  hives   Complete by: As directed    Call MD for:  persistant dizziness or light-headedness   Complete by: As directed    Call MD for:  persistant nausea and vomiting   Complete by: As directed    Call MD for:  redness, tenderness, or signs of infection (pain, swelling, redness, odor or green/yellow discharge around incision site)   Complete by: As directed    Call MD for:  severe uncontrolled pain   Complete by: As directed    Call MD for:  temperature >100.4   Complete by: As directed    Diet - low sodium heart healthy   Complete by: As directed    Discharge instructions   Complete by: As directed    May shower. Diet as tolerated. No heavy lifting   Increase activity slowly   Complete by: As directed    No wound care   Complete by: As directed      Allergies as of 01/30/2019      Reactions   Lidocaine Hcl  Anaphylaxis, Other (See Comments)   Xylocaine   Xylocaine [lidocaine] Anaphylaxis   Codeine Nausea And Vomiting   Crestor [rosuvastatin Calcium] Other (See Comments)   All-over body aches Myalgias    Statins Other (See Comments)   Muscle soreness and an aching feeling all over Myalgias   Percocet [oxycodone-acetaminophen] Itching      Medication List    TAKE these medications   acetaminophen 500 MG tablet Commonly known as: TYLENOL Take 1,000 mg by mouth every 6 (six) hours as needed for mild pain or headache.   albuterol 108 (90 Base) MCG/ACT inhaler Commonly known as: VENTOLIN HFA Inhale 2 puffs into the lungs every 4 (four) hours as needed for wheezing or shortness of breath.   budesonide-formoterol 80-4.5 MCG/ACT inhaler Commonly known as: Symbicort Inhale 2 puffs into the lungs 2 (two) times daily. What changed: when to take this   cetirizine 10 MG tablet Commonly known as: ZYRTEC Take 10 mg by mouth daily as needed for allergies.   diltiazem 120 MG 24 hr capsule Commonly known as: CARDIZEM CD TAKE ONE CAPSULE BY MOUTH DAILY   diphenoxylate-atropine 2.5-0.025 MG tablet Commonly known as: LOMOTIL Take 1 tablet by mouth 4 (four) times daily as needed for diarrhea or loose stools.   Eliquis 5 MG Tabs tablet Generic drug: apixaban TAKE ONE TABLET (5MG  TOTAL) BY MOUTH TWODAILY What changed: See the new instructions.  ezetimibe 10 MG tablet Commonly known as: ZETIA TAKE ONE (1) TABLET BY MOUTH EVERY DAY What changed: See the new instructions.   Fish Oil 500 MG Caps Take 1 capsule by mouth 2 (two) times daily.   fluticasone 50 MCG/ACT nasal spray Commonly known as: FLONASE Place 2 sprays into both nostrils daily as needed for allergies or rhinitis.   gabapentin 300 MG capsule Commonly known as: NEURONTIN Take 300 mg by mouth 2 (two) times daily.   methocarbamol 750 MG tablet Commonly known as: ROBAXIN Take 1 tablet (750 mg total) by mouth 4 (four) times  daily as needed (use for muscle cramps/pain).   metoprolol tartrate 25 MG tablet Commonly known as: LOPRESSOR TAKE ONE TABLET BY MOUTH TWICE A DAY   mycophenolate 500 MG tablet Commonly known as: CELLCEPT Take 2 tablets (1,000 mg total) by mouth 2 (two) times daily.   Ofev 100 MG Caps Generic drug: Nintedanib Take 1 capsule by mouth 2 (two) times daily.   ondansetron 4 MG tablet Commonly known as: ZOFRAN Take 4 mg by mouth every 8 (eight) hours as needed for nausea or vomiting.   predniSONE 10 MG tablet Commonly known as: DELTASONE Take 1 tablet (10 mg total) by mouth daily with breakfast. What changed: how much to take   PROBIOTIC DAILY PO Take 1 tablet by mouth daily.   sulfamethoxazole-trimethoprim 800-160 MG tablet Commonly known as: BACTRIM DS TAKE ONE TABLET BY MOUTH TWICE A DAY ON MONDAY, WEDNESDAY, AND FRIDAY.   tamsulosin 0.4 MG Caps capsule Commonly known as: FLOMAX Take 0.4 mg by mouth 2 (two) times daily.   traMADol 50 MG tablet Commonly known as: ULTRAM Take 1-2 tablets (50-100 mg total) by mouth every 6 (six) hours as needed.      Follow-up Information    [name], MD Follow up on 02/03/2019.   Specialty: Internal Medicine Why: This appointment is with [name], NP at 10am; Dr. [place] Crane is booked out further than 2 weeks. If you need to change and would like Dr. [place] Crane please call and reschedule.  Contact information: [address] [facility] [address] [phone]        Troy Sine, MD .   Specialty: Cardiology Contact information: [address] [place] [address] [phone]        [name], MD Follow up in 2 week(s).   Specialty: General Surgery Contact information: [place] [address] [phone]           Signed: [name] 02/02/2019, 3:59 PM

## 2019-02-03 DIAGNOSIS — J8410 Pulmonary fibrosis, unspecified: Secondary | ICD-10-CM | POA: Diagnosis not present

## 2019-02-03 DIAGNOSIS — E1169 Type 2 diabetes mellitus with other specified complication: Secondary | ICD-10-CM | POA: Diagnosis not present

## 2019-02-03 DIAGNOSIS — I4891 Unspecified atrial fibrillation: Secondary | ICD-10-CM | POA: Diagnosis not present

## 2019-02-04 DIAGNOSIS — J8410 Pulmonary fibrosis, unspecified: Secondary | ICD-10-CM | POA: Diagnosis not present

## 2019-02-04 DIAGNOSIS — I4891 Unspecified atrial fibrillation: Secondary | ICD-10-CM | POA: Diagnosis not present

## 2019-02-04 DIAGNOSIS — E1169 Type 2 diabetes mellitus with other specified complication: Secondary | ICD-10-CM | POA: Diagnosis not present

## 2019-02-16 DIAGNOSIS — J8410 Pulmonary fibrosis, unspecified: Secondary | ICD-10-CM | POA: Diagnosis not present

## 2019-02-16 DIAGNOSIS — B370 Candidal stomatitis: Secondary | ICD-10-CM | POA: Diagnosis not present

## 2019-02-16 DIAGNOSIS — M25562 Pain in left knee: Secondary | ICD-10-CM | POA: Diagnosis not present

## 2019-02-16 DIAGNOSIS — R197 Diarrhea, unspecified: Secondary | ICD-10-CM | POA: Diagnosis not present

## 2019-03-03 ENCOUNTER — Other Ambulatory Visit: Payer: Self-pay | Admitting: Pulmonary Disease

## 2019-03-04 DIAGNOSIS — S32010D Wedge compression fracture of first lumbar vertebra, subsequent encounter for fracture with routine healing: Secondary | ICD-10-CM | POA: Diagnosis not present

## 2019-03-11 DIAGNOSIS — S32010A Wedge compression fracture of first lumbar vertebra, initial encounter for closed fracture: Secondary | ICD-10-CM | POA: Diagnosis not present

## 2019-03-11 DIAGNOSIS — S32010D Wedge compression fracture of first lumbar vertebra, subsequent encounter for fracture with routine healing: Secondary | ICD-10-CM | POA: Diagnosis not present

## 2019-03-12 ENCOUNTER — Telehealth: Payer: Self-pay | Admitting: Critical Care Medicine

## 2019-03-12 DIAGNOSIS — Z7952 Long term (current) use of systemic steroids: Secondary | ICD-10-CM | POA: Diagnosis not present

## 2019-03-12 DIAGNOSIS — Z13820 Encounter for screening for osteoporosis: Secondary | ICD-10-CM | POA: Diagnosis not present

## 2019-03-12 DIAGNOSIS — M25511 Pain in right shoulder: Secondary | ICD-10-CM | POA: Diagnosis not present

## 2019-03-12 DIAGNOSIS — J84112 Idiopathic pulmonary fibrosis: Secondary | ICD-10-CM | POA: Diagnosis not present

## 2019-03-12 DIAGNOSIS — R768 Other specified abnormal immunological findings in serum: Secondary | ICD-10-CM | POA: Diagnosis not present

## 2019-03-12 DIAGNOSIS — S32010D Wedge compression fracture of first lumbar vertebra, subsequent encounter for fracture with routine healing: Secondary | ICD-10-CM | POA: Diagnosis not present

## 2019-03-12 DIAGNOSIS — M542 Cervicalgia: Secondary | ICD-10-CM | POA: Diagnosis not present

## 2019-03-12 DIAGNOSIS — M353 Polymyalgia rheumatica: Secondary | ICD-10-CM | POA: Diagnosis not present

## 2019-03-12 NOTE — Telephone Encounter (Signed)
Based off the information provided I would recommend the patient take a drug holiday from both his antifibrotic (Ofev) as well as the CellCept.  This would mean the he stops taking both of them for 7 days entirely.   Agree the patient needs to follow-up with [facility] and let them know.  We will await their response.  Please schedule the patient with Dr. Carlis Abbott for follow-up sometime over the next 1 to 2 weeks.  If nothing is available then can schedule with APP.  If scheduling on a Monday morning or a Friday that I would also recommend scheduling time with the clinical pharmacy team for the pharmacy team to also be available to review the medications with the patient.  If patient continues to have worsened symptoms or starts passing bright red blood in his stool he needs to present to the emergency room for further evaluation.  We will route to Dr. Carlis Abbott as Juluis Rainier.  Wyn Quaker, FNP

## 2019-03-12 NOTE — Telephone Encounter (Signed)
Spoke with patient.  He states he will stop both medications fro 7 days only.  He will contact Dr. Ethlyn Gallery office as soon as he got off the phone.  He is scheduled Monday 11/5 with Dr. Carlis Abbott. He will head to Emergency room if symptoms worsen.  Nothing further needed at this time.

## 2019-03-12 NOTE — Telephone Encounter (Signed)
Message routed to app of the day, Rexene Edison, NP  Tammy, I called the patient back and he stated that he was issued Cellcept and read up on the side effects. He had a lap assisted colostomy reversal on 01/26/19 and did not take Cellcept until 3 weeks after the procedure.  Patient started back on medication about 02/16/19, and started noticing blood in his stool on 03/01/19 or 03/02/19. Patient said the blood seen was bright in color and enough blood to put a red tinge to the water in bowl.  Patient denied having hemorrhoids, fever, abdominal distention, abdominal pain, N/V/D or muscle cramps.  Patient stated that he did contact [facility] to let them know about this yesterday, but has not heard back from them yet.  Patient was issued Cellcept 11/26/18 under [name] as authorizing provider.  Based on the information provided by the patient and his last office visit with Dr. Carlis Abbott on 01/21/19, please advise of recommendations. Thanks.

## 2019-03-13 NOTE — Telephone Encounter (Signed)
Agree with what Aaron Edelman said- drug holiday and to the ED if fevers, chills, abdominal pain or distention, nausea, vomiting.  LPC

## 2019-03-13 NOTE — Telephone Encounter (Signed)
Thanks  Terrianne Cavness

## 2019-03-16 DIAGNOSIS — M5116 Intervertebral disc disorders with radiculopathy, lumbar region: Secondary | ICD-10-CM | POA: Diagnosis not present

## 2019-03-16 DIAGNOSIS — M5415 Radiculopathy, thoracolumbar region: Secondary | ICD-10-CM | POA: Diagnosis not present

## 2019-03-19 ENCOUNTER — Other Ambulatory Visit: Payer: Self-pay

## 2019-03-19 ENCOUNTER — Ambulatory Visit: Payer: PPO | Admitting: Critical Care Medicine

## 2019-03-19 ENCOUNTER — Encounter: Payer: Self-pay | Admitting: Critical Care Medicine

## 2019-03-19 VITALS — BP 120/60 | HR 74 | Temp 97.7°F | Ht 69.0 in | Wt 179.4 lb

## 2019-03-19 DIAGNOSIS — J8489 Other specified interstitial pulmonary diseases: Secondary | ICD-10-CM

## 2019-03-19 DIAGNOSIS — D849 Immunodeficiency, unspecified: Secondary | ICD-10-CM

## 2019-03-19 DIAGNOSIS — J849 Interstitial pulmonary disease, unspecified: Secondary | ICD-10-CM | POA: Diagnosis not present

## 2019-03-19 NOTE — Progress Notes (Signed)
Synopsis: Referred in November 2017 for ILD by Randall Squibb, MD.  Previously patient of Randall. [place] Roberts.  Subjective:   [name] GENDER: male DOB: [birth year], MRN: [id number]  Chief Complaint  Patient presents with  . Follow-up      [name] is a 73 year old gentleman with history of ILD to NSIP and polymyalgia rheumatica on chronic immunosuppression who presents for follow-up.  He underwent colostomy reversal on 01/26/2019, for which his CellCept plus Bactrim and nintedanib were held due to concern for potential surgical complications.  Several weeks following surgery his medications were restarted, and within a day or 2 he developed rectal bleeding.  This continued for several days until he notified us, and we instructed him to stop.  Within the next day or 2 the bleeding has stopped.  He notices that his breathing has started to get a little bit worse over the last few weeks, and he would like to restart his medications.  He feels like he cannot exert himself due to his mild dyspnea.  He denies abdominal pain, fevers, nausea, vomiting, diarrhea, constipation, or periodic rectal bleeding.  He is following up with Randall. Marlou Roberts surgery tomorrow.  He had had a colonoscopy just prior to surgery, but is unsure if he had hemorrhoids.  He follows with Randall. Amil Roberts in rheumatology.  His prednisone has been tapered down to 7 mg, which will be his dose for now.  There has been mention of possibly starting Plaquenil in the future for his polymyalgia rheumatica.  OV 01/21/19: [name] is a 73 year old gentleman with a history of fibrosing NSIP diagnosed on open lung biopsy in 2019 who presents for routine follow-up and preoperative evaluation before colostomy reversal next week.  He has a history of polymyalgia rheumatica for which she has been on chronic steroids.  He is followed by Randall. Amil Roberts from rheumatology.  Currently he is being titrated off his chronic  prednisone, currently on 8 mg daily.  For his NSIP he is maintained on CellCept, nintedanib, and Bactrim 3 days a week.  He is doing well, better than last year from breathlessness standpoint.  He went hiking with his wife a few weeks ago, which he reports he would have been unable to do a year ago.  He has nausea and diarrhea associated with nintedanib, but he is able to tolerate these with the use of Zofran and Imodium.  No jaundice, itching, right upper quadrant pain.  In February 2020 he underwent emergency partial colectomy with diverting colostomy for perforated diverticulitis.  He is planning to undergo colostomy reversal next week.  In July he fell, causing an L1 compression fracture.  Randall. Amil Roberts is planning to do a bone mineral density study, and currently he is not on calcium and vitamin D supplements.  In a few months he is planning on having a small outpatient back surgery to address a bone spur that is causing radicular pain in his left leg.  Today he wants to discuss plans for his pulmonary meds perioperatively.  He has previously had symptomatic renal insufficiency when he tried to come off prednisone on his own too quickly.  During his VATS and colectomy he received 10 mg of dexamethasone in the OR, and he received low-dose Solu-Medrol postoperatively from his colectomy.  Flowsheet data:  Lab Results  Component Value Date   NITRICOXIDE 8 03/03/2018    Past Medical History:  Diagnosis Date  . Allergy to alpha-gal    2015; has been  able to re-introduce red meat for the past 2 1/2 years without reaction (as of 01/21/19)  . Arthritis   . Chest pain    a. 03/2017: echo showing EF of 60-65%, no regional WMA or significant valve abnormalities. b. 03/2016: NST with no evidence of ischemia.   . Complication of anesthesia    "anaphylactic" reaction after given xylocaine for shoulder injection > 10 years ago; reported no known reaction when given marcaine (as of 01/21/19)   . Digestive problems     on Prednisone prn for this issue- diarrhea  . Dyspnea    with activity  . Dysrhythmia    irregular due to Myocarditis- takes Atenolol, Norvasc  . GERD (gastroesophageal reflux disease)   . H/O viral myocarditis    25 years ago  . Head injury, closed, with concussion    Breif LOC  . Hyperlipidemia   . Mild mitral valve prolapse    per [name] notes  . PAF (paroxysmal atrial fibrillation) ([name])    a. initially occuring in 02/2016. b. recurrent in 07/2016. Placed on Eliquis  . Polymyalgia rheumatica ([name])   . Pre-diabetes   . Pulmonary fibrosis ([place])   . Sleep apnea    mild sleep apnea   no CPAP     Family History  Problem Relation Age of Onset  . Rheum arthritis Mother   . Thyroid cancer Mother   . Heart disease Father   . Asthma Father   . Thyroid cancer Sister   . Melanoma Brother      Past Surgical History:  Procedure Laterality Date  . apendectomy  1965  . APPENDECTOMY    . bone spur Bilateral 1999   feet  . CARDIAC CATHETERIZATION  2001  . CHOLECYSTECTOMY  2004  . COLON RESECTION N/A 06/15/2018   Procedure: SIGMOID COLON RESECTION;  Surgeon: Randall Kussmaul, MD;  Location: [facility];  Service: General;  Laterality: N/A;  . COLONOSCOPY    . COLOSTOMY N/A 06/15/2018   Procedure: COLOSTOMY;  Surgeon: Randall Kussmaul, MD;  Location: [facility];  Service: General;  Laterality: N/A;  . COLOSTOMY TAKEDOWN N/A 01/26/2019   Procedure: LAPAROSCOPIC ASSISTED COLOSTOMY TAKEDOWN;  Surgeon: Randall Kussmaul, MD;  Location: [place];  Service: General;  Laterality: N/A;  . EYE SURGERY Bilateral    cataract removal  . LAPAROSCOPIC LYSIS OF ADHESIONS N/A 01/26/2019   Procedure: LAPAROSCOPIC LYSIS OF ADHESIONS;  Surgeon: Randall Kussmaul, MD;  Location: [place];  Service: General;  Laterality: N/A;  . LAPAROTOMY N/A 06/15/2018   Procedure: EXPLORATORY LAPAROTOMY;  Surgeon: Randall Kussmaul, MD;  Location: [facility];  Service: General;  Laterality: N/A;  . LUMBAR LAMINECTOMY/ DECOMPRESSION WITH MET-RX  Right 05/05/2013   Procedure: Right Lumbar three-four Extraforaminal Microdiskectomy with Metrex;  Surgeon: [name], MD;  Location: [facility];  Service: Neurosurgery;  Laterality: Right;  Right Lumbar three-four Extraforaminal Microdiskectomy with Metrex  . LUNG BIOPSY Right 02/19/2018   Procedure: LUNG BIOPSY;  Surgeon: [name], MD;  Location: [place];  Service: Thoracic;  Laterality: Right;  . SHOULDER OPEN ROTATOR CUFF REPAIR Bilateral 2001  . TONSILLECTOMY    . VIDEO ASSISTED THORACOSCOPY Right 02/19/2018   Procedure: VIDEO ASSISTED THORACOSCOPY;  Surgeon: [name], MD;  Location: [facility];  Service: Thoracic;  Laterality: Right;    Social History   Socioeconomic History  . Marital status: Married    Spouse name: Not on file  . Number of children: Not on  file  . Years of education: Not on file  . Highest education level: Not on file  Occupational History  . Not on file  Social Needs  . Financial resource strain: Not on file  . Food insecurity    Worry: Not on file    Inability: Not on file  . Transportation needs    Medical: Not on file    Non-medical: Not on file  Tobacco Use  . Smoking status: Never Smoker  . Smokeless tobacco: Never Used  Substance and Sexual Activity  . Alcohol use: Yes    Alcohol/week: 21.0 standard drinks    Types: 21 Glasses of wine per week    Comment: 2/3 glasses wine in evening  . Drug use: No  . Sexual activity: Yes    Birth control/protection: Other-see comments    Comment: old age  Lifestyle  . Physical activity    Days per week: Not on file    Minutes per session: Not on file  . Stress: Not on file  Relationships  . Social Herbalist on phone: Not on file    Gets together: Not on file    Attends religious service: Not on file    Active member of club or organization: Not on file    Attends meetings of clubs or organizations: Not on file    Relationship status: Not on file  . Intimate partner  violence    Fear of current or ex partner: Not on file    Emotionally abused: Not on file    Physically abused: Not on file    Forced sexual activity: Not on file  Other Topics Concern  . Not on file  Social History Narrative  . Not on file     Allergies  Allergen Reactions  . Lidocaine Hcl Anaphylaxis and Other (See Comments)    Xylocaine  . Xylocaine [Lidocaine] Anaphylaxis  . Codeine Nausea And Vomiting  . Crestor [Rosuvastatin Calcium] Other (See Comments)    All-over body aches Myalgias   . Statins Other (See Comments)    Muscle soreness and an aching feeling all over Myalgias  . Percocet [Oxycodone-Acetaminophen] Itching     Immunization History  Administered Date(s) Administered  . Influenza Split 03/20/2016  . Influenza, High Dose Seasonal PF 01/29/2018, 02/25/2019  . Influenza,inj,Quad PF,6+ Mos 03/12/2017  . Pneumococcal Conjugate-13 01/10/2018    Outpatient Medications Prior to Visit  Medication Sig Dispense Refill  . acetaminophen (TYLENOL) 500 MG tablet Take 1,000 mg by mouth every 6 (six) hours as needed for mild pain or headache.     . albuterol (PROVENTIL HFA;VENTOLIN HFA) 108 (90 Base) MCG/ACT inhaler Inhale 2 puffs into the lungs every 4 (four) hours as needed for wheezing or shortness of breath.     . budesonide-formoterol (SYMBICORT) 80-4.5 MCG/ACT inhaler Inhale 2 puffs into the lungs 2 (two) times daily. (Patient taking differently: Inhale 2 puffs into the lungs daily. ) 1 Inhaler 12  . cetirizine (ZYRTEC) 10 MG tablet Take 10 mg by mouth daily as needed for allergies.     Marland Kitchen diltiazem (CARDIZEM CD) 120 MG 24 hr capsule TAKE ONE CAPSULE BY MOUTH DAILY 30 capsule 6  . diphenoxylate-atropine (LOMOTIL) 2.5-0.025 MG tablet Take 1 tablet by mouth 4 (four) times daily as needed for diarrhea or loose stools.    Marland Kitchen ELIQUIS 5 MG TABS tablet TAKE ONE TABLET ('5MG'$  TOTAL) BY MOUTH TWODAILY (Patient taking differently: Take 5 mg by mouth 2 (two) times daily. )  180  tablet 1  . ezetimibe (ZETIA) 10 MG tablet TAKE ONE (1) TABLET BY MOUTH EVERY DAY (Patient taking differently: Take 10 mg by mouth daily. ) 90 tablet 3  . fluticasone (FLONASE) 50 MCG/ACT nasal spray Place 2 sprays into both nostrils daily as needed for allergies or rhinitis.    Marland Kitchen gabapentin (NEURONTIN) 300 MG capsule Take 300 mg by mouth 2 (two) times daily.     . methocarbamol (ROBAXIN) 750 MG tablet Take 1 tablet (750 mg total) by mouth 4 (four) times daily as needed (use for muscle cramps/pain). 20 tablet 0  . metoprolol tartrate (LOPRESSOR) 25 MG tablet TAKE ONE TABLET BY MOUTH TWICE A DAY 180 tablet 3  . mycophenolate (CELLCEPT) 500 MG tablet Take 2 tablets (1,000 mg total) by mouth 2 (two) times daily. 120 tablet 2  . OFEV 100 MG CAPS Take 1 capsule by mouth 2 (two) times daily.    . Omega-3 Fatty Acids (FISH OIL) 500 MG CAPS Take 1 capsule by mouth 2 (two) times daily.    . ondansetron (ZOFRAN) 4 MG tablet Take 4 mg by mouth every 8 (eight) hours as needed for nausea or vomiting.    . predniSONE (DELTASONE) 10 MG tablet Take 1 tablet (10 mg total) by mouth daily with breakfast. (Patient taking differently: Take 8 mg by mouth daily with breakfast. ) 30 tablet 5  . Probiotic Product (PROBIOTIC DAILY PO) Take 1 tablet by mouth daily.    Marland Kitchen sulfamethoxazole-trimethoprim (BACTRIM DS) 800-160 MG tablet TAKE ONE TABLET BY MOUTH TWICE A DAY ON MONDAY, WEDNESDAY, AND FRIDAY 12 tablet 1  . tamsulosin (FLOMAX) 0.4 MG CAPS capsule Take 0.4 mg by mouth 2 (two) times daily.     . traMADol (ULTRAM) 50 MG tablet Take 1-2 tablets (50-100 mg total) by mouth every 6 (six) hours as needed. 20 tablet 0   No facility-administered medications prior to visit.     Review of Systems  Constitutional: Negative for chills, fever and weight loss.  HENT: Negative.   Eyes: Negative.   Respiratory: Negative for cough.        Progressively developing SOB  Cardiovascular: Negative for chest pain and leg swelling.   Gastrointestinal: Negative for abdominal pain, constipation, nausea and vomiting.  Musculoskeletal: Negative.   Skin: Negative.   Neurological: Negative.      Objective:   Vitals:   03/19/19 1516  BP: 120/60  Pulse: 74  Temp: 97.7 F (36.5 C)  TempSrc: Oral  SpO2: 96%  Weight: 179 lb 6.4 oz (81.4 kg)  Height: 5' 9" (1.753 m)   96% on RA BMI Readings from Last 3 Encounters:  03/19/19 26.49 kg/m  01/26/19 25.84 kg/m  01/21/19 25.99 kg/m   Wt Readings from Last 3 Encounters:  03/19/19 179 lb 6.4 oz (81.4 kg)  01/26/19 175 lb (79.4 kg)  01/21/19 176 lb (79.8 kg)    Physical Exam Vitals signs reviewed.  Constitutional:      Appearance: Normal appearance. He is not ill-appearing.  HENT:     Head: Normocephalic and atraumatic.     Nose:     Comments: Deferred due to masking requirement.    Mouth/Throat:     Comments: Deferred due to masking requirement. Eyes:     General: No scleral icterus. Neck:     Musculoskeletal: Neck supple.  Cardiovascular:     Rate and Rhythm: Normal rate and regular rhythm.     Heart sounds: No murmur.  Pulmonary:  Comments: Breathing comfortably on room air, no tachypnea or conversational dyspnea.  Clear to auscultation bilaterally. Abdominal:     Comments: Nondistended.  Well-healed scars on the abdomen-no erythema or drainage.  Musculoskeletal:        General: No swelling or deformity.  Lymphadenopathy:     Cervical: No cervical adenopathy.  Skin:    General: Skin is warm and dry.     Findings: No rash.  Neurological:     General: No focal deficit present.     Mental Status: He is alert.     Coordination: Coordination normal.  Psychiatric:        Mood and Affect: Mood normal.        Behavior: Behavior normal.      CBC    Component Value Date/Time   WBC 11.6 (H) 01/29/2019 0107   RBC 3.54 (L) 01/29/2019 0107   HGB 13.4 01/29/2019 0107   HCT 37.5 (L) 01/29/2019 0107   PLT 180 01/29/2019 0107   MCV 105.9 (H)  01/29/2019 0107   MCH 37.9 (H) 01/29/2019 0107   MCHC 35.7 01/29/2019 0107   RDW 13.7 01/29/2019 0107   LYMPHSABS 2.0 01/29/2019 0107   MONOABS 0.7 01/29/2019 0107   EOSABS 0.0 01/29/2019 0107   BASOSABS 0.0 01/29/2019 0107     Previous labs reviewed: November 2017 reviewed: CRP 139, total CK normal, antineutrophil cytoplasmic antibody testing negative  October 2019 alpha-1 antitrypsin M-M  Chest Imaging- films reviewed: CT chest 12/06/2016 - Groundglass opacities and interlobular septal thickening in the lower lobes. No honeycombing. No adenopathy or pleural disease.     CT abdomen pelvis 07/01/2018-  Scars in the RLL and RML from previous biopsy. Persistent GGO and interlobular septal thickening. Does not appear to have progressive fibrosis compared to previous CT scan.  Pulmonary Functions Testing Results: PFT Results Latest Ref Rng & Units 01/10/2018 10/26/2016 05/03/2016  FVC-Pre L 3.52 4.21 3.55  FVC-Predicted Pre % 82 96 80  FVC-Post L 3.53 4.08 3.65  FVC-Predicted Post % 83 93 83  Pre FEV1/FVC % % 81 78 81  Post FEV1/FCV % % 82 81 83  FEV1-Pre L 2.85 3.30 2.88  FEV1-Predicted Pre % 91 102 88  FEV1-Post L 2.88 3.30 3.02  DLCO UNC% % 68 84 75  DLCO COR %Predicted % 81 87 91  TLC L 7.25 6.79 5.85  TLC % Predicted % 106 98 84  RV % Predicted % 155 102 94    Pathology 03/01/2018: Biopsies from right upper lobe, right middle lobe, right lower lobe showing temporal and spatial variation in a pattern of fibrosing interstitial pneumonia.  Prominent fibroblastic foci numerous areas of osseous metaplasia.  No honeycombing.  Areas of spared alveoli.  Echocardiogram 03/22/2016: LVEF 60 to 65%, normal wall motion, grade 1 diastolic dysfunction.  Normal RV.  Normal left atrium.  Normal valves    Assessment & Plan:     ICD-10-CM   1. ILD (interstitial lung disease) ([place])  J84.9   2. NSIP (nonspecific interstitial pneumonia) ([place])  J84.89   3. Immunosuppressed status ([place])  D84.9     Fibrosing NSIP on nintedanib & mycophenolate -long-term has been stable and doing well.  -Due to bleeding complications he has not yet restarted his antifibrotic's and immunosuppression regimen.  We will plan to restart at the 70-monthmark postop-November 14.  He will restart mycophenolate with Bactrim only.  If he is doing well, he may restart Ofev on November 21.  If he develops bleeding  again, he should stop the medications and notify us immediately. -Continue prednisone per rheumatology's recommendations. -RTC in 4 weeks.  At that time we can do his surveillance LFTs & CBC. -He will be able to continue zofran & immodium for GI symptom management with nintedanib; some people have less side effects after an interval off of the meds. -Continue aspirin, social distancing, handwashing per COVID-19 precautions -Up-to-date on seasonal flu vaccine  Chronic immunosuppression- prednisone & mycophenolate -Continue Bactrim 3 days weekly once restarting CellCept  Hyperbilirubinemia w/o transaminase elevation, possibly related to nintedanib.  Normal on preop evaluation on 01/15/2019. -Continue to monitor every 3 months on nintedanib.  No current indication to stop   RTC in 4 weeks.   Current Outpatient Medications:  .  acetaminophen (TYLENOL) 500 MG tablet, Take 1,000 mg by mouth every 6 (six) hours as needed for mild pain or headache. , Disp: , Rfl:  .  albuterol (PROVENTIL HFA;VENTOLIN HFA) 108 (90 Base) MCG/ACT inhaler, Inhale 2 puffs into the lungs every 4 (four) hours as needed for wheezing or shortness of breath. , Disp: , Rfl:  .  budesonide-formoterol (SYMBICORT) 80-4.5 MCG/ACT inhaler, Inhale 2 puffs into the lungs 2 (two) times daily. (Patient taking differently: Inhale 2 puffs into the lungs daily. ), Disp: 1 Inhaler, Rfl: 12 .  cetirizine (ZYRTEC) 10 MG tablet, Take 10 mg by mouth daily as needed for allergies. , Disp: , Rfl:  .  diltiazem (CARDIZEM CD) 120 MG 24 hr capsule, TAKE ONE CAPSULE  BY MOUTH DAILY, Disp: 30 capsule, Rfl: 6 .  diphenoxylate-atropine (LOMOTIL) 2.5-0.025 MG tablet, Take 1 tablet by mouth 4 (four) times daily as needed for diarrhea or loose stools., Disp: , Rfl:  .  ELIQUIS 5 MG TABS tablet, TAKE ONE TABLET ('5MG'$  TOTAL) BY MOUTH TWODAILY (Patient taking differently: Take 5 mg by mouth 2 (two) times daily. ), Disp: 180 tablet, Rfl: 1 .  ezetimibe (ZETIA) 10 MG tablet, TAKE ONE (1) TABLET BY MOUTH EVERY DAY (Patient taking differently: Take 10 mg by mouth daily. ), Disp: 90 tablet, Rfl: 3 .  fluticasone (FLONASE) 50 MCG/ACT nasal spray, Place 2 sprays into both nostrils daily as needed for allergies or rhinitis., Disp: , Rfl:  .  gabapentin (NEURONTIN) 300 MG capsule, Take 300 mg by mouth 2 (two) times daily. , Disp: , Rfl:  .  methocarbamol (ROBAXIN) 750 MG tablet, Take 1 tablet (750 mg total) by mouth 4 (four) times daily as needed (use for muscle cramps/pain)., Disp: 20 tablet, Rfl: 0 .  metoprolol tartrate (LOPRESSOR) 25 MG tablet, TAKE ONE TABLET BY MOUTH TWICE A DAY, Disp: 180 tablet, Rfl: 3 .  mycophenolate (CELLCEPT) 500 MG tablet, Take 2 tablets (1,000 mg total) by mouth 2 (two) times daily., Disp: 120 tablet, Rfl: 2 .  OFEV 100 MG CAPS, Take 1 capsule by mouth 2 (two) times daily., Disp: , Rfl:  .  Omega-3 Fatty Acids (FISH OIL) 500 MG CAPS, Take 1 capsule by mouth 2 (two) times daily., Disp: , Rfl:  .  ondansetron (ZOFRAN) 4 MG tablet, Take 4 mg by mouth every 8 (eight) hours as needed for nausea or vomiting., Disp: , Rfl:  .  predniSONE (DELTASONE) 10 MG tablet, Take 1 tablet (10 mg total) by mouth daily with breakfast. (Patient taking differently: Take 8 mg by mouth daily with breakfast. ), Disp: 30 tablet, Rfl: 5 .  Probiotic Product (PROBIOTIC DAILY PO), Take 1 tablet by mouth daily., Disp: , Rfl:  .  sulfamethoxazole-trimethoprim (BACTRIM DS) 800-160 MG tablet, TAKE ONE TABLET BY MOUTH TWICE A DAY ON MONDAY, WEDNESDAY, AND FRIDAY, Disp: 12 tablet, Rfl: 1  .  tamsulosin (FLOMAX) 0.4 MG CAPS capsule, Take 0.4 mg by mouth 2 (two) times daily. , Disp: , Rfl:  .  traMADol (ULTRAM) 50 MG tablet, Take 1-2 tablets (50-100 mg total) by mouth every 6 (six) hours as needed., Disp: 20 tablet, Rfl: 0   Julian Hy, DO [facility] 03/19/2019 4:48 PM

## 2019-03-19 NOTE — Patient Instructions (Signed)
Thank you for visiting Dr. Carlis Abbott at [facility]. We recommend the following:  Restart cellcept and Bactrim on Nov 14. Start Ofev on Nov 21.  Stop the medications if you have bleeding that returns and please call us.  Return in about 4 weeks (around 04/16/2019).    Please do your part to reduce the spread of COVID-19.

## 2019-03-20 DIAGNOSIS — M25511 Pain in right shoulder: Secondary | ICD-10-CM | POA: Diagnosis not present

## 2019-03-25 ENCOUNTER — Other Ambulatory Visit: Payer: Self-pay | Admitting: Pulmonary Disease

## 2019-03-26 ENCOUNTER — Other Ambulatory Visit: Payer: Self-pay | Admitting: Physician Assistant

## 2019-03-26 DIAGNOSIS — Z13820 Encounter for screening for osteoporosis: Secondary | ICD-10-CM

## 2019-03-30 DIAGNOSIS — R197 Diarrhea, unspecified: Secondary | ICD-10-CM | POA: Diagnosis not present

## 2019-03-30 DIAGNOSIS — I4891 Unspecified atrial fibrillation: Secondary | ICD-10-CM | POA: Diagnosis not present

## 2019-03-30 DIAGNOSIS — Z712 Person consulting for explanation of examination or test findings: Secondary | ICD-10-CM | POA: Diagnosis not present

## 2019-03-30 DIAGNOSIS — R945 Abnormal results of liver function studies: Secondary | ICD-10-CM | POA: Diagnosis not present

## 2019-03-30 DIAGNOSIS — I482 Chronic atrial fibrillation, unspecified: Secondary | ICD-10-CM | POA: Diagnosis not present

## 2019-03-30 DIAGNOSIS — G4730 Sleep apnea, unspecified: Secondary | ICD-10-CM | POA: Diagnosis not present

## 2019-03-30 DIAGNOSIS — K921 Melena: Secondary | ICD-10-CM | POA: Diagnosis not present

## 2019-03-30 DIAGNOSIS — I10 Essential (primary) hypertension: Secondary | ICD-10-CM | POA: Diagnosis not present

## 2019-03-30 DIAGNOSIS — J849 Interstitial pulmonary disease, unspecified: Secondary | ICD-10-CM | POA: Diagnosis not present

## 2019-03-30 DIAGNOSIS — R0981 Nasal congestion: Secondary | ICD-10-CM | POA: Diagnosis not present

## 2019-03-30 DIAGNOSIS — N401 Enlarged prostate with lower urinary tract symptoms: Secondary | ICD-10-CM | POA: Diagnosis not present

## 2019-03-30 DIAGNOSIS — R0602 Shortness of breath: Secondary | ICD-10-CM | POA: Diagnosis not present

## 2019-03-30 DIAGNOSIS — M353 Polymyalgia rheumatica: Secondary | ICD-10-CM | POA: Diagnosis not present

## 2019-04-07 DIAGNOSIS — I4891 Unspecified atrial fibrillation: Secondary | ICD-10-CM | POA: Diagnosis not present

## 2019-04-07 DIAGNOSIS — E1169 Type 2 diabetes mellitus with other specified complication: Secondary | ICD-10-CM | POA: Diagnosis not present

## 2019-04-07 DIAGNOSIS — J8410 Pulmonary fibrosis, unspecified: Secondary | ICD-10-CM | POA: Diagnosis not present

## 2019-04-13 ENCOUNTER — Other Ambulatory Visit: Payer: Self-pay | Admitting: Cardiovascular Disease

## 2019-04-15 DIAGNOSIS — I10 Essential (primary) hypertension: Secondary | ICD-10-CM | POA: Diagnosis not present

## 2019-04-15 DIAGNOSIS — E1169 Type 2 diabetes mellitus with other specified complication: Secondary | ICD-10-CM | POA: Diagnosis not present

## 2019-04-15 DIAGNOSIS — R7301 Impaired fasting glucose: Secondary | ICD-10-CM | POA: Diagnosis not present

## 2019-04-15 DIAGNOSIS — E782 Mixed hyperlipidemia: Secondary | ICD-10-CM | POA: Diagnosis not present

## 2019-04-15 DIAGNOSIS — E1165 Type 2 diabetes mellitus with hyperglycemia: Secondary | ICD-10-CM | POA: Diagnosis not present

## 2019-04-16 ENCOUNTER — Encounter: Payer: Self-pay | Admitting: Critical Care Medicine

## 2019-04-16 ENCOUNTER — Other Ambulatory Visit: Payer: Self-pay

## 2019-04-16 ENCOUNTER — Ambulatory Visit: Payer: PPO | Admitting: Critical Care Medicine

## 2019-04-16 VITALS — BP 128/72 | HR 91 | Temp 97.3°F | Ht 69.0 in | Wt 180.4 lb

## 2019-04-16 DIAGNOSIS — D849 Immunodeficiency, unspecified: Secondary | ICD-10-CM | POA: Diagnosis not present

## 2019-04-16 DIAGNOSIS — J8489 Other specified interstitial pulmonary diseases: Secondary | ICD-10-CM

## 2019-04-16 DIAGNOSIS — J849 Interstitial pulmonary disease, unspecified: Secondary | ICD-10-CM

## 2019-04-16 DIAGNOSIS — Z5181 Encounter for therapeutic drug level monitoring: Secondary | ICD-10-CM | POA: Diagnosis not present

## 2019-04-16 NOTE — Progress Notes (Signed)
Synopsis: Referred in November 2017 for ILD by [name], MD.  Previously patient of [name].  Subjective:   [name] GENDER: male DOB: 1945/11/27, MRN: [id number]  Chief Complaint  Patient presents with   Follow-up    Patient is feeling good overall. Patient states that he is experiencing shortness of breath with exertion.      [name] is a 73 year old gentleman who presents for follow-up of NSIP.  He has had worse symptoms since interrupting his CellCept and nintedanib for his colectomy reversal.  He had bleeding when he initially restarted these medicines a few weeks postop, which required stopping them again.  He restarted his nintedanib last week and CellCept with Bactrim about 2 weeks prior.  He has not had any additional bleeding.  No abdominal pain.  He feels better than he did before surgery.  He still has mild dyspnea, not worse than his last visit.  He notices it most when he is leaning over.  No other complaints.  His PCP did his 12-monthlab work earlier this week, and he will call the office to request they send [place].      OV 03/19/2019: [name] a 73year old gentleman with history of ILD to NSIP and polymyalgia rheumatica on chronic immunosuppression who presents for follow-up.  He underwent colostomy reversal on 01/26/2019, for which his CellCept plus Bactrim and nintedanib were held due to concern for potential surgical complications.  Several weeks following surgery his medications were restarted, and within a day or 2 he developed rectal bleeding.  This continued for several days until he notified [place] and we instructed him to stop.  Within the next day or 2 the bleeding has stopped.  He notices that his breathing has started to get a little bit worse over the last few weeks, and he would like to restart his medications.  He feels like he cannot exert himself due to his mild dyspnea.  He denies  abdominal pain, fevers, nausea, vomiting, diarrhea, constipation, or periodic rectal bleeding.  He is following up with Dr. TMarlou Starkssurgery tomorrow.  He had had a colonoscopy just prior to surgery, but is unsure if he had hemorrhoids.  He follows with Dr. BAmil Amenin rheumatology.  His prednisone has been tapered down to 7 mg, which will be his dose for now.  There has been mention of possibly starting Plaquenil in the future for his polymyalgia rheumatica.  OV 01/21/19: [name] a 73year old gentleman with a history of fibrosing NSIP diagnosed on open lung biopsy in 2019 who presents for routine follow-up and preoperative evaluation before colostomy reversal next week.  He has a history of polymyalgia rheumatica for which she has been on chronic steroids.  He is followed by Dr. BAmil Amenfrom rheumatology.  Currently he is being titrated off his chronic prednisone, currently on 8 mg daily.  For his NSIP he is maintained on CellCept, nintedanib, and Bactrim 3 days a week.  He is doing well, better than last year from breathlessness standpoint.  He went hiking with his wife a few weeks ago, which he reports he would have been unable to do a year ago.  He has nausea and diarrhea associated with nintedanib, but he is able to tolerate these with the use of Zofran and Imodium.  No jaundice, itching, right upper quadrant pain.  In February 2020 he underwent emergency partial colectomy with diverting colostomy for perforated diverticulitis.  He is planning to  undergo colostomy reversal next week.  In July he fell, causing an L1 compression fracture.  [name] is planning to do a bone mineral density study, and currently he is not on calcium and vitamin D supplements.  In a few months he is planning on having a small outpatient back surgery to address a bone spur that is causing radicular pain in his left leg.  Today he wants to discuss plans for his pulmonary meds perioperatively.  He has previously had symptomatic  renal insufficiency when he tried to come off prednisone on his own too quickly.  During his VATS and colectomy he received 10 mg of dexamethasone in the OR, and he received low-dose Solu-Medrol postoperatively from his colectomy.  Flowsheet data:  Lab Results  Component Value Date   NITRICOXIDE 8 03/03/2018    Past Medical History:  Diagnosis Date   Allergy to alpha-gal    2015; has been able to re-introduce red meat for the past 2 1/2 years without reaction (as of 01/21/19)   Arthritis    Chest pain    a. 03/2017: echo showing EF of 60-65%, no regional WMA or significant valve abnormalities. b. 03/2016: NST with no evidence of ischemia.    Complication of anesthesia    "anaphylactic" reaction after given xylocaine for shoulder injection > 10 years ago; reported no known reaction when given marcaine (as of 01/21/19)    Digestive problems    on Prednisone prn for this issue- diarrhea   Dyspnea    with activity   Dysrhythmia    irregular due to Myocarditis- takes Atenolol, Norvasc   GERD (gastroesophageal reflux disease)    H/O viral myocarditis    25 years ago   Head injury, closed, with concussion    Breif LOC   Hyperlipidemia    Mild mitral valve prolapse    per Dr Evette Georges notes   PAF (paroxysmal atrial fibrillation) ([place])    a. initially occuring in 02/2016. b. recurrent in 07/2016. Placed on Eliquis   Polymyalgia rheumatica (HCC)    Pre-diabetes    Pulmonary fibrosis (HCC)    Sleep apnea    mild sleep apnea   no CPAP     Family History  Problem Relation Age of Onset   Rheum arthritis Mother    Thyroid cancer Mother    Heart disease Father    Asthma Father    Thyroid cancer Sister    Melanoma Brother      Past Surgical History:  Procedure Laterality Date   apendectomy  1965   APPENDECTOMY     bone spur Bilateral 1999   feet   CARDIAC CATHETERIZATION  2001   CHOLECYSTECTOMY  2004   COLON RESECTION N/A 06/15/2018   Procedure: SIGMOID  COLON RESECTION;  Surgeon: [name], MD;  Location: [facility];  Service: General;  Laterality: N/A;   COLONOSCOPY     COLOSTOMY N/A 06/15/2018   Procedure: COLOSTOMY;  Surgeon: [name], MD;  Location: [facility];  Service: General;  Laterality: N/A;   COLOSTOMY TAKEDOWN N/A 01/26/2019   Procedure: LAPAROSCOPIC ASSISTED COLOSTOMY TAKEDOWN;  Surgeon: [name], MD;  Location: [place];  Service: General;  Laterality: N/A;   EYE SURGERY Bilateral    cataract removal   LAPAROSCOPIC LYSIS OF ADHESIONS N/A 01/26/2019   Procedure: LAPAROSCOPIC LYSIS OF ADHESIONS;  Surgeon: [name], MD;  Location: [place];  Service: General;  Laterality: N/A;   LAPAROTOMY N/A 06/15/2018   Procedure: EXPLORATORY LAPAROTOMY;  Surgeon: [name], MD;  Location: [facility];  Service: General;  Laterality: N/A;   LUMBAR LAMINECTOMY/ DECOMPRESSION WITH MET-RX Right 05/05/2013   Procedure: Right Lumbar three-four Extraforaminal Microdiskectomy with Metrex;  Surgeon: [name], MD;  Location: [facility];  Service: Neurosurgery;  Laterality: Right;  Right Lumbar three-four Extraforaminal Microdiskectomy with Metrex   LUNG BIOPSY Right 02/19/2018   Procedure: LUNG BIOPSY;  Surgeon: [name], MD;  Location: [place];  Service: Thoracic;  Laterality: Right;   SHOULDER OPEN ROTATOR CUFF REPAIR Bilateral 2001   TONSILLECTOMY     VIDEO ASSISTED THORACOSCOPY Right 02/19/2018   Procedure: VIDEO ASSISTED THORACOSCOPY;  Surgeon: [name], MD;  Location: [facility];  Service: Thoracic;  Laterality: Right;    Social History   Socioeconomic History   Marital status: Married    Spouse name: Not on file   Number of children: Not on file   Years of education: Not on file   Highest education level: Not on file  Occupational History   Not on file  Social Needs   Financial resource strain: Not on file   Food insecurity    Worry: Not on file    Inability: Not on file   Transportation  needs    Medical: Not on file    Non-medical: Not on file  Tobacco Use   Smoking status: Never Smoker   Smokeless tobacco: Never Used  Substance and Sexual Activity   Alcohol use: Yes    Alcohol/week: 21.0 standard drinks    Types: 21 Glasses of wine per week    Comment: 2/3 glasses wine in evening   Drug use: No   Sexual activity: Yes    Birth control/protection: Other-see comments    Comment: old age  Lifestyle   Physical activity    Days per week: Not on file    Minutes per session: Not on file   Stress: Not on file  Relationships   Social connections    Talks on phone: Not on file    Gets together: Not on file    Attends religious service: Not on file    Active member of club or organization: Not on file    Attends meetings of clubs or organizations: Not on file    Relationship status: Not on file   Intimate partner violence    Fear of current or ex partner: Not on file    Emotionally abused: Not on file    Physically abused: Not on file    Forced sexual activity: Not on file  Other Topics Concern   Not on file  Social History Narrative   Not on file     Allergies  Allergen Reactions   Lidocaine Hcl Anaphylaxis and Other (See Comments)    Xylocaine   Xylocaine [Lidocaine] Anaphylaxis   Codeine Nausea And Vomiting   Crestor [Rosuvastatin Calcium] Other (See Comments)    All-over body aches Myalgias    Statins Other (See Comments)    Muscle soreness and an aching feeling all over Myalgias   Percocet [Oxycodone-Acetaminophen] Itching     Immunization History  Administered Date(s) Administered   Influenza Split 03/20/2016   Influenza, High Dose Seasonal PF 01/29/2018, 02/25/2019   Influenza,inj,Quad PF,6+ Mos 03/12/2017   Pneumococcal Conjugate-13 01/10/2018    Outpatient Medications Prior to Visit  Medication Sig Dispense Refill   acetaminophen (TYLENOL) 500 MG tablet Take 1,000 mg by mouth every 6 (six) hours as needed for mild  pain or  headache.      albuterol (PROVENTIL HFA;VENTOLIN HFA) 108 (90 Base) MCG/ACT inhaler Inhale 2 puffs into the lungs every 4 (four) hours as needed for wheezing or shortness of breath.      budesonide-formoterol (SYMBICORT) 80-4.5 MCG/ACT inhaler Inhale 2 puffs into the lungs 2 (two) times daily. (Patient taking differently: Inhale 2 puffs into the lungs daily. ) 1 Inhaler 12   cetirizine (ZYRTEC) 10 MG tablet Take 10 mg by mouth daily as needed for allergies.      diltiazem (CARDIZEM CD) 120 MG 24 hr capsule TAKE ONE CAPSULE BY MOUTH DAILY 30 capsule 6   diphenoxylate-atropine (LOMOTIL) 2.5-0.025 MG tablet Take 1 tablet by mouth 4 (four) times daily as needed for diarrhea or loose stools.     ELIQUIS 5 MG TABS tablet TAKE ONE TABLET (5MG TOTAL) BY MOUTH TWODAILY (Patient taking differently: Take 5 mg by mouth 2 (two) times daily. ) 180 tablet 1   ezetimibe (ZETIA) 10 MG tablet TAKE ONE (1) TABLET BY MOUTH EVERY DAY 90 tablet 3   fluticasone (FLONASE) 50 MCG/ACT nasal spray Place 2 sprays into both nostrils daily as needed for allergies or rhinitis.     gabapentin (NEURONTIN) 300 MG capsule Take 300 mg by mouth 2 (two) times daily.      methocarbamol (ROBAXIN) 750 MG tablet Take 1 tablet (750 mg total) by mouth 4 (four) times daily as needed (use for muscle cramps/pain). 20 tablet 0   metoprolol tartrate (LOPRESSOR) 25 MG tablet TAKE ONE TABLET BY MOUTH TWICE A DAY 180 tablet 3   mycophenolate (CELLCEPT) 500 MG tablet Take 2 tablets (1,000 mg total) by mouth 2 (two) times daily. 120 tablet 2   OFEV 100 MG CAPS TAKE 1 CAPSULE TWICE A DAY 60 capsule 11   Omega-3 Fatty Acids (FISH OIL) 500 MG CAPS Take 1 capsule by mouth 2 (two) times daily.     ondansetron (ZOFRAN) 4 MG tablet Take 4 mg by mouth every 8 (eight) hours as needed for nausea or vomiting.     predniSONE (DELTASONE) 10 MG tablet Take 1 tablet (10 mg total) by mouth daily with breakfast. (Patient taking differently: Take  7 mg by mouth daily with breakfast. ) 30 tablet 5   Probiotic Product (PROBIOTIC DAILY PO) Take 1 tablet by mouth daily.     sulfamethoxazole-trimethoprim (BACTRIM DS) 800-160 MG tablet TAKE ONE TABLET BY MOUTH TWICE A DAY ON MONDAY, WEDNESDAY, AND FRIDAY 12 tablet 1   tamsulosin (FLOMAX) 0.4 MG CAPS capsule Take 0.4 mg by mouth 2 (two) times daily.      traMADol (ULTRAM) 50 MG tablet Take 1-2 tablets (50-100 mg total) by mouth every 6 (six) hours as needed. 20 tablet 0   No facility-administered medications prior to visit.     Review of Systems  Constitutional: Negative for chills, fever and weight loss.  HENT: Negative.   Eyes: Negative.   Respiratory: Negative for cough.        Progressively developing SOB  Cardiovascular: Negative for chest pain and leg swelling.  Gastrointestinal: Negative for abdominal pain, constipation, nausea and vomiting.  Musculoskeletal: Negative.   Skin: Negative.   Neurological: Negative.      Objective:   Vitals:   04/16/19 1602  BP: 128/72  Pulse: 91  Temp: (!) 97.3 F (36.3 C)  TempSrc: Temporal  SpO2: 96%  Weight: 180 lb 6.4 oz (81.8 kg)  Height: '5\' 9"'  (1.753 m)   96% on RA BMI Readings from Last  3 Encounters:  04/16/19 26.64 kg/m  03/19/19 26.49 kg/m  01/26/19 25.84 kg/m   Wt Readings from Last 3 Encounters:  04/16/19 180 lb 6.4 oz (81.8 kg)  03/19/19 179 lb 6.4 oz (81.4 kg)  01/26/19 175 lb (79.4 kg)    Physical Exam Vitals signs reviewed.  Constitutional:      Appearance: Normal appearance. He is not ill-appearing.  HENT:     Head: Normocephalic and atraumatic.     Nose:     Comments: Deferred due to masking requirement.    Mouth/Throat:     Comments: Deferred due to masking requirement. Eyes:     General: No scleral icterus. Neck:     Musculoskeletal: Neck supple.  Cardiovascular:     Rate and Rhythm: Normal rate and regular rhythm.     Heart sounds: No murmur.  Pulmonary:     Comments: Breathing  comfortably on room air, no tachypnea or conversational dyspnea.  Clear to auscultation bilaterally. Abdominal:     Comments: Nondistended.  Well-healed scars on the abdomen-no erythema or drainage.  Musculoskeletal:        General: No swelling or deformity.  Lymphadenopathy:     Cervical: No cervical adenopathy.  Skin:    General: Skin is warm and dry.     Findings: No rash.  Neurological:     General: No focal deficit present.     Mental Status: He is alert.     Coordination: Coordination normal.  Psychiatric:        Mood and Affect: Mood normal.        Behavior: Behavior normal.      CBC    Component Value Date/Time   WBC 11.6 (H) 01/29/2019 0107   RBC 3.54 (L) 01/29/2019 0107   HGB 13.4 01/29/2019 0107   HCT 37.5 (L) 01/29/2019 0107   PLT 180 01/29/2019 0107   MCV 105.9 (H) 01/29/2019 0107   MCH 37.9 (H) 01/29/2019 0107   MCHC 35.7 01/29/2019 0107   RDW 13.7 01/29/2019 0107   LYMPHSABS 2.0 01/29/2019 0107   MONOABS 0.7 01/29/2019 0107   EOSABS 0.0 01/29/2019 0107   BASOSABS 0.0 01/29/2019 0107     Previous labs reviewed: November 2017 reviewed: CRP 139, total CK normal, antineutrophil cytoplasmic antibody testing negative  October 2019 alpha-1 antitrypsin M-M  Chest Imaging- films reviewed: CT chest 12/06/2016 - Groundglass opacities and interlobular septal thickening in the lower lobes. No honeycombing. No adenopathy or pleural disease.     CT abdomen pelvis 07/01/2018-  Scars in the RLL and RML from previous biopsy. Persistent GGO and interlobular septal thickening. Does not appear to have progressive fibrosis compared to previous CT scan.  Pulmonary Functions Testing Results: PFT Results Latest Ref Rng & Units 01/10/2018 10/26/2016 05/03/2016  FVC-Pre L 3.52 4.21 3.55  FVC-Predicted Pre % 82 96 80  FVC-Post L 3.53 4.08 3.65  FVC-Predicted Post % 83 93 83  Pre FEV1/FVC % % 81 78 81  Post FEV1/FCV % % 82 81 83  FEV1-Pre L 2.85 3.30 2.88  FEV1-Predicted Pre %  91 102 88  FEV1-Post L 2.88 3.30 3.02  DLCO UNC% % 68 84 75  DLCO COR %Predicted % 81 87 91  TLC L 7.25 6.79 5.85  TLC % Predicted % 106 98 84  RV % Predicted % 155 102 94    Pathology 03/01/2018: Biopsies from right upper lobe, right middle lobe, right lower lobe showing temporal and spatial variation in a pattern of fibrosing interstitial  pneumonia.  Prominent fibroblastic foci numerous areas of osseous metaplasia.  No honeycombing.  Areas of spared alveoli.  Echocardiogram 03/22/2016: LVEF 60 to 65%, normal wall motion, grade 1 diastolic dysfunction.  Normal RV.  Normal left atrium.  Normal valves    Assessment & Plan:     ICD-10-CM   1. ILD (interstitial lung disease) ([place])  J84.9   2. NSIP (nonspecific interstitial pneumonia) (HCC)  T11.73 COMPLETE METABOLIC PANEL WITH GFR    CBC w/Diff  3. Immunosuppressed status ([place])  D84.9   4. Therapeutic drug monitoring  V67.01 COMPLETE METABOLIC PANEL WITH GFR    CBC w/Diff   Fibrosing NSIP on nintedanib & mycophenolate -long-term has been stable and doing well.  -Continue CellCept, Bactrim 3 days/week, nintedanib. -Requesting 10-monthlabs from his PCP to ensure no medication toxicity. -Continue mask wearing, social distancing, handwashing per COVID-19 precautions -Up-to-date on seasonal flu vaccine -Repeat PFTs  Chronic immunosuppression- prednisone & mycophenolate -Continue Bactrim 3 days/week  Hyperbilirubinemia w/o transaminase elevation, possibly related to nintedanib.  Normal on preop evaluation on 01/15/2019. -Continue to monitor every 3 months on nintedanib-requesting CMP results from this week   RTC in 3 months after PFTs.   Current Outpatient Medications:    acetaminophen (TYLENOL) 500 MG tablet, Take 1,000 mg by mouth every 6 (six) hours as needed for mild pain or headache. , Disp: , Rfl:    albuterol (PROVENTIL HFA;VENTOLIN HFA) 108 (90 Base) MCG/ACT inhaler, Inhale 2 puffs into the lungs every 4 (four) hours as needed  for wheezing or shortness of breath. , Disp: , Rfl:    budesonide-formoterol (SYMBICORT) 80-4.5 MCG/ACT inhaler, Inhale 2 puffs into the lungs 2 (two) times daily. (Patient taking differently: Inhale 2 puffs into the lungs daily. ), Disp: 1 Inhaler, Rfl: 12   cetirizine (ZYRTEC) 10 MG tablet, Take 10 mg by mouth daily as needed for allergies. , Disp: , Rfl:    diltiazem (CARDIZEM CD) 120 MG 24 hr capsule, TAKE ONE CAPSULE BY MOUTH DAILY, Disp: 30 capsule, Rfl: 6   diphenoxylate-atropine (LOMOTIL) 2.5-0.025 MG tablet, Take 1 tablet by mouth 4 (four) times daily as needed for diarrhea or loose stools., Disp: , Rfl:    ELIQUIS 5 MG TABS tablet, TAKE ONE TABLET (5MG TOTAL) BY MOUTH TWODAILY (Patient taking differently: Take 5 mg by mouth 2 (two) times daily. ), Disp: 180 tablet, Rfl: 1   ezetimibe (ZETIA) 10 MG tablet, TAKE ONE (1) TABLET BY MOUTH EVERY DAY, Disp: 90 tablet, Rfl: 3   fluticasone (FLONASE) 50 MCG/ACT nasal spray, Place 2 sprays into both nostrils daily as needed for allergies or rhinitis., Disp: , Rfl:    gabapentin (NEURONTIN) 300 MG capsule, Take 300 mg by mouth 2 (two) times daily. , Disp: , Rfl:    methocarbamol (ROBAXIN) 750 MG tablet, Take 1 tablet (750 mg total) by mouth 4 (four) times daily as needed (use for muscle cramps/pain)., Disp: 20 tablet, Rfl: 0   metoprolol tartrate (LOPRESSOR) 25 MG tablet, TAKE ONE TABLET BY MOUTH TWICE A DAY, Disp: 180 tablet, Rfl: 3   mycophenolate (CELLCEPT) 500 MG tablet, Take 2 tablets (1,000 mg total) by mouth 2 (two) times daily., Disp: 120 tablet, Rfl: 2   OFEV 100 MG CAPS, TAKE 1 CAPSULE TWICE A DAY, Disp: 60 capsule, Rfl: 11   Omega-3 Fatty Acids (FISH OIL) 500 MG CAPS, Take 1 capsule by mouth 2 (two) times daily., Disp: , Rfl:    ondansetron (ZOFRAN) 4 MG tablet, Take 4 mg  by mouth every 8 (eight) hours as needed for nausea or vomiting., Disp: , Rfl:    predniSONE (DELTASONE) 10 MG tablet, Take 1 tablet (10 mg total) by mouth  daily with breakfast. (Patient taking differently: Take 7 mg by mouth daily with breakfast. ), Disp: 30 tablet, Rfl: 5   Probiotic Product (PROBIOTIC DAILY PO), Take 1 tablet by mouth daily., Disp: , Rfl:    sulfamethoxazole-trimethoprim (BACTRIM DS) 800-160 MG tablet, TAKE ONE TABLET BY MOUTH TWICE A DAY ON MONDAY, WEDNESDAY, AND FRIDAY, Disp: 12 tablet, Rfl: 1   tamsulosin (FLOMAX) 0.4 MG CAPS capsule, Take 0.4 mg by mouth 2 (two) times daily. , Disp: , Rfl:    Julian Hy, DO [facility] 04/16/2019 4:06 PM

## 2019-04-16 NOTE — Patient Instructions (Addendum)
Thank you for visiting Dr. Carlis Abbott at [facility]. We recommend the following: Orders Placed This Encounter  Procedures  . Pulmonary function test   Orders Placed This Encounter  Procedures  . Pulmonary function test    Standing Status:   Future    Standing Expiration Date:   04/15/2020    Order Specific Question:   Where should this test be performed?    Answer:   [facility]    Order Specific Question:   Full PFT: includes the following: basic spirometry, spirometry pre & post bronchodilator, diffusion capacity (DLCO), lung volumes    Answer:   Full PFT      Return in about 3 months (around 07/15/2019).    Please do your part to reduce the spread of COVID-19.

## 2019-04-17 ENCOUNTER — Ambulatory Visit (HOSPITAL_COMMUNITY): Payer: PPO | Attending: Orthopaedic Surgery | Admitting: Occupational Therapy

## 2019-04-17 ENCOUNTER — Other Ambulatory Visit: Payer: Self-pay

## 2019-04-17 ENCOUNTER — Encounter (HOSPITAL_COMMUNITY): Payer: Self-pay | Admitting: Occupational Therapy

## 2019-04-17 ENCOUNTER — Other Ambulatory Visit: Payer: Self-pay | Admitting: Critical Care Medicine

## 2019-04-17 DIAGNOSIS — R1319 Other dysphagia: Secondary | ICD-10-CM | POA: Insufficient documentation

## 2019-04-17 DIAGNOSIS — R29898 Other symptoms and signs involving the musculoskeletal system: Secondary | ICD-10-CM | POA: Diagnosis not present

## 2019-04-17 DIAGNOSIS — M25511 Pain in right shoulder: Secondary | ICD-10-CM | POA: Diagnosis not present

## 2019-04-17 NOTE — Patient Instructions (Signed)

## 2019-04-17 NOTE — Therapy (Signed)
[place], [place], [address] Phone: [phone]   Fax:  [phone]  Occupational Therapy Evaluation  Patient Details  Name: [name] MRN: [id number] Date of Birth: 11/24/1945 Referring Provider (OT): Dr. Ophelia Charter   Encounter Date: 04/17/2019  OT End of Session - 04/17/19 1441    Visit Number  1    Number of Visits  5    Date for OT Re-Evaluation  05/17/19    Authorization Type  [employer]; $10 copay    Authorization Time Period  no visit limit    OT Start Time  1350    OT Stop Time  1430    OT Time Calculation (min)  40 min    Activity Tolerance  Patient tolerated treatment well    Behavior During Therapy  [facility] for tasks assessed/performed       Past Medical History:  Diagnosis Date  . Allergy to alpha-gal    2015; has been able to re-introduce red meat for the past 2 1/2 years without reaction (as of 01/21/19)  . Arthritis   . Chest pain    a. 03/2017: echo showing EF of 60-65%, no regional WMA or significant valve abnormalities. b. 03/2016: NST with no evidence of ischemia.   . Complication of anesthesia    "anaphylactic" reaction after given xylocaine for shoulder injection > 10 years ago; reported no known reaction when given marcaine (as of 01/21/19)   . Digestive problems    on Prednisone prn for this issue- diarrhea  . Dyspnea    with activity  . Dysrhythmia    irregular due to Myocarditis- takes Atenolol, Norvasc  . GERD (gastroesophageal reflux disease)   . H/O viral myocarditis    25 years ago  . Head injury, closed, with concussion    Breif LOC  . Hyperlipidemia   . Mild mitral valve prolapse    per Dr Evette Georges notes  . PAF (paroxysmal atrial fibrillation) ([name])    a. initially occuring in 02/2016. b. recurrent in 07/2016. Placed on Eliquis  . Polymyalgia rheumatica ([name])   . Pre-diabetes   . Pulmonary fibrosis ([name])   . Sleep apnea    mild sleep apnea   no CPAP     Past Surgical History:  Procedure Laterality Date  . apendectomy  1965  . APPENDECTOMY    . bone spur Bilateral 1999   feet  . CARDIAC CATHETERIZATION  2001  . CHOLECYSTECTOMY  2004  . COLON RESECTION N/A 06/15/2018   Procedure: SIGMOID COLON RESECTION;  Surgeon: [name], MD;  [facility];  Service: General;  Laterality: N/A;  . COLONOSCOPY    . COLOSTOMY N/A 06/15/2018   Procedure: COLOSTOMY;  Surgeon: [name], MD;  [facility];  Service: General;  Laterality: N/A;  . COLOSTOMY TAKEDOWN N/A 01/26/2019   Procedure: LAPAROSCOPIC ASSISTED COLOSTOMY TAKEDOWN;  Surgeon: [name], MD;  Location: [place];  Service: General;  Laterality: N/A;  . EYE SURGERY Bilateral    cataract removal  . LAPAROSCOPIC LYSIS OF ADHESIONS N/A 01/26/2019   Procedure: LAPAROSCOPIC LYSIS OF ADHESIONS;  Surgeon: [name], MD;  Location: [place];  Service: General;  Laterality: N/A;  . LAPAROTOMY N/A 06/15/2018   Procedure: EXPLORATORY LAPAROTOMY;  Surgeon: [name], MD;  [facility];  Service: General;  Laterality: N/A;  . LUMBAR LAMINECTOMY/ DECOMPRESSION WITH MET-RX Right 05/05/2013   Procedure: Right Lumbar three-four Extraforaminal Microdiskectomy with  Metrex;  Surgeon: [name], MD;  Location: [place] [facility];  Service: Neurosurgery;  Laterality: Right;  Right Lumbar three-four Extraforaminal Microdiskectomy with Metrex  . LUNG BIOPSY Right 02/19/2018   Procedure: LUNG BIOPSY;  Surgeon: [name], MD;  Location: [place];  Service: Thoracic;  Laterality: Right;  . SHOULDER OPEN ROTATOR CUFF REPAIR Bilateral 2001  . TONSILLECTOMY    . VIDEO ASSISTED THORACOSCOPY Right 02/19/2018   Procedure: VIDEO ASSISTED THORACOSCOPY;  Surgeon: [name], MD;  Location: [place];  Service: Thoracic;  Laterality: Right;    There were no vitals filed for this visit.  Subjective Assessment - 04/17/19 1415    Subjective   S: It's been better since I had the shot in my  back.    Pertinent History  Pt is a 73 y/o male presenting with right shoulder pain present for approximately 3 months, occurred after a fall in which he fractured his L3 and ribs. Pt reports since having a steriod shot in his back and taking prednisone his pain has decreased. Pt was referred to occupational therapy for evaluation and treatment by Dr. Ophelia Charter.    Special Tests  Complete FOTO next session    Patient Stated Goals  To have less pain in my arm during tasks.    Currently in Pain?  No/denies        [facility] OT Assessment - 04/17/19 1353      Assessment   Medical Diagnosis  Right RC tendonitis    Referring Provider (OT)  Dr. Ophelia Charter    Onset Date/Surgical Date  03/27/19    Next MD Visit  2 weeks    Prior Therapy  None      Precautions   Precautions  None      Restrictions   Weight Bearing Restrictions  No      Balance Screen   Has the patient fallen in the past 6 months  Yes    How many times?  1    Has the patient had a decrease in activity level because of a fear of falling?   No    Is the patient reluctant to leave their home because of a fear of falling?   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 Occasionally with reaching out to the side.       Written Expression   Dominant Hand  Right      Cognition   Overall Cognitive Status  Within Functional Limits for tasks assessed      Observation/Other Assessments   Focus on Therapeutic Outcomes (FOTO)   Complete next session      ROM / Strength   AROM / PROM / Strength  AROM;PROM;Strength      Palpation   Palpation comment  mod fascial restrictions in right upper arm/anterior shoulder and trapezius regions      AROM   Overall AROM Comments  Assessed seated, er/IR adducted    AROM Assessment Site  Shoulder    Right/Left  Shoulder  Right    Right Shoulder Flexion  170 Degrees    Right Shoulder ABduction  180 Degrees    Right Shoulder Internal Rotation  90 Degrees    Right Shoulder External Rotation  71 Degrees      PROM   Overall PROM   Within functional limits for tasks performed    PROM Assessment Site  Shoulder    Right/Left Shoulder  Right      Strength   Overall Strength Comments  Assessed seated, er/IR adducted    Strength Assessment Site  Shoulder    Right/Left Shoulder  Right    Right Shoulder Flexion  5/5    Right Shoulder ABduction  4/5    Right Shoulder Internal Rotation  5/5    Right Shoulder External Rotation  4/5                      OT Education - 04/17/19 1415    Education Details  red scapular theraband    Person(s) Educated  Patient    Methods  Explanation;Demonstration;Handout    Comprehension  Verbalized understanding;Returned demonstration       OT Short Term Goals - 04/17/19 1445      OT SHORT TERM GOAL #1   Title  Pt will be provided with and educated on HEP for improved mobility required for ADL completion.    Time  4    Period  Weeks    Status  New    Target Date  05/17/19      OT SHORT TERM GOAL #2   Title  Pt will decrease RUE fascial restrictions to minimal amounts or less to improve mobilty required for reaching tasks.    Time  4    Period  Weeks    Status  New      OT SHORT TERM GOAL #3   Title  Pt will increase RUE strength to 5/5 to increase ability to lift and carry objects, shoot a shotgun, and fly fish without fatigue or pain.    Time  4    Period  Weeks    Status  New      OT SHORT TERM GOAL #4   Title  Pt will decrease pain in RUE to 2/10 or less to improve ability to sleep for 4 consecutive hours or greater.    Time  4    Period  Weeks    Status  New               Plan - 04/17/19 1442    Clinical Impression Statement  A: Pt is a 73 y/o male presenting with right shoulder pain due to rotator cuff tendonitis, pt  reporting less pain since steroid shot in back. ROM is functional, however pt demonstrates mild weakness with abduction and er, pain with palpation of right shoulder region.    OT Occupational Profile and History  Problem Focused Assessment - Including review of records relating to presenting problem    Occupational performance deficits (Please refer to evaluation for details):  ADL's;IADL's;Rest and Sleep;Work;Leisure    Body Structure / Function / Physical Skills  ADL;UE functional use;Fascial restriction;Pain;IADL;Strength    Rehab Potential  Good    Clinical Decision Making  Limited treatment options, no task modification necessary    Comorbidities Affecting Occupational Performance:  None    Modification or Assistance to Complete Evaluation   No modification of tasks or assist necessary to complete eval    OT Frequency  1x / week    OT Duration  4 weeks    OT Treatment/Interventions  Self-care/ADL training;Ultrasound;Patient/family education;Passive range of motion;Cryotherapy;Electrical Stimulation;Moist Heat;Therapeutic exercise;Manual Therapy;Therapeutic activities    Plan  P: Pt will  benefit from skilled OT services to decrease pain and fascial restrictions, increase strength and activity tolerance required for functional task completion. Treatment plan: myofascial release, P/ROM, A/ROM, general RUE strengthening, scapular strengthening and stability, modalities prn    Consulted and Agree with Plan of Care  Patient       Patient will benefit from skilled therapeutic intervention in order to improve the following deficits and impairments:   Body Structure / Function / Physical Skills: ADL, UE functional use, Fascial restriction, Pain, IADL, Strength       Visit Diagnosis: Acute pain of right shoulder  Other symptoms and signs involving the musculoskeletal system    Problem List Patient Active Problem List   Diagnosis Date Noted  . Colostomy in place [facility]) 01/26/2019  .  Prolonged Q-T interval on ECG 06/25/2018  . 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 Sigmoid colon perforation s/p Hartmann colectomy/colostomy 06/15/2018 06/15/2018  . Perforated diverticulum of large intestine 06/15/2018  . OSA (obstructive sleep apnea) 12/13/2016  . Snoring 10/26/2016  . PAF (paroxysmal atrial fibrillation) ([place]) 08/14/2016  . ILD (interstitial lung disease) ([employer]) 05/01/2016  . Lumbar stenosis 02/01/2015  . Giardia 04/14/2014  . H/O viral cardiomyopathy 04/14/2014  . Herniated nucleus pulposus, L3-4 right 05/05/2013  . Other hypertrophic cardiomyopathy ([place]) 04/19/2013  . Hyperlipidemia 04/19/2013  . Essential hypertension 04/19/2013  . Abnormal LFTs 09/16/2012  . Nausea 07/10/2011  . Chronic diarrhea 07/10/2011  . Burping 07/10/2011  . Osteoporosis 07/10/2011  . Arthritis 07/10/2011   Guadelupe Sabin, OTR/L  [phone] 04/17/2019, 2:47 PM  [facility] [address] [place], [place], [address] Phone: [phone]   Fax:  [phone]  Name: Ragnar Waas MRN: [id number] Date of Birth: Aug 14, 1945

## 2019-04-22 DIAGNOSIS — I482 Chronic atrial fibrillation, unspecified: Secondary | ICD-10-CM | POA: Diagnosis not present

## 2019-04-22 DIAGNOSIS — I10 Essential (primary) hypertension: Secondary | ICD-10-CM | POA: Diagnosis not present

## 2019-04-22 DIAGNOSIS — G4730 Sleep apnea, unspecified: Secondary | ICD-10-CM | POA: Diagnosis not present

## 2019-04-22 DIAGNOSIS — M545 Low back pain: Secondary | ICD-10-CM | POA: Diagnosis not present

## 2019-04-22 DIAGNOSIS — I2510 Atherosclerotic heart disease of native coronary artery without angina pectoris: Secondary | ICD-10-CM | POA: Diagnosis not present

## 2019-04-22 DIAGNOSIS — M353 Polymyalgia rheumatica: Secondary | ICD-10-CM | POA: Diagnosis not present

## 2019-04-22 DIAGNOSIS — F5101 Primary insomnia: Secondary | ICD-10-CM | POA: Diagnosis not present

## 2019-04-22 DIAGNOSIS — E0965 Drug or chemical induced diabetes mellitus with hyperglycemia: Secondary | ICD-10-CM | POA: Diagnosis not present

## 2019-04-22 DIAGNOSIS — J849 Interstitial pulmonary disease, unspecified: Secondary | ICD-10-CM | POA: Diagnosis not present

## 2019-04-22 DIAGNOSIS — M25519 Pain in unspecified shoulder: Secondary | ICD-10-CM | POA: Diagnosis not present

## 2019-04-24 ENCOUNTER — Encounter (HOSPITAL_COMMUNITY): Payer: Self-pay | Admitting: Occupational Therapy

## 2019-04-24 ENCOUNTER — Other Ambulatory Visit: Payer: Self-pay

## 2019-04-24 ENCOUNTER — Ambulatory Visit (HOSPITAL_COMMUNITY): Payer: PPO | Admitting: Occupational Therapy

## 2019-04-24 DIAGNOSIS — M25511 Pain in right shoulder: Secondary | ICD-10-CM

## 2019-04-24 DIAGNOSIS — R29898 Other symptoms and signs involving the musculoskeletal system: Secondary | ICD-10-CM

## 2019-04-24 NOTE — Therapy (Signed)
[place], [place], [address] Phone: [phone]   Fax:  [phone]  Occupational Therapy Treatment  Patient Details  Name: [name] MRN: [id number] Date of Birth: [birth year] Referring Provider (OT): Dr. Ophelia Charter   Encounter Date: 04/24/2019  OT End of Session - 04/24/19 1745    Visit Number  2    Number of Visits  5    Date for OT Re-Evaluation  05/17/19    Authorization Type  [employer]; $10 copay    Authorization Time Period  no visit limit    OT Start Time  1648    OT Stop Time  1730    OT Time Calculation (min)  42 min    Activity Tolerance  Patient tolerated treatment well    Behavior During Therapy  [facility] for tasks assessed/performed       Past Medical History:  Diagnosis Date  . Allergy to alpha-gal    2015; has been able to re-introduce red meat for the past 2 1/2 years without reaction (as of 01/21/19)  . Arthritis   . Chest pain    a. 03/2017: echo showing EF of 60-65%, no regional WMA or significant valve abnormalities. b. 03/2016: NST with no evidence of ischemia.   . Complication of anesthesia    "anaphylactic" reaction after given xylocaine for shoulder injection > 10 years ago; reported no known reaction when given marcaine (as of 01/21/19)   . Digestive problems    on Prednisone prn for this issue- diarrhea  . Dyspnea    with activity  . Dysrhythmia    irregular due to Myocarditis- takes Atenolol, Norvasc  . GERD (gastroesophageal reflux disease)   . H/O viral myocarditis    25 years ago  . Head injury, closed, with concussion    Breif LOC  . Hyperlipidemia   . Mild mitral valve prolapse    per Dr Evette Georges notes  . PAF (paroxysmal atrial fibrillation) ([place])    a. initially occuring in 02/2016. b. recurrent in 07/2016. Placed on Eliquis  . Polymyalgia rheumatica ([place])   . Pre-diabetes   . Pulmonary fibrosis ([place])   . Sleep apnea    mild sleep apnea   no CPAP     Past Surgical History:  Procedure Laterality Date  . apendectomy  1965  . APPENDECTOMY    . bone spur Bilateral 1999   feet  . CARDIAC CATHETERIZATION  2001  . CHOLECYSTECTOMY  2004  . COLON RESECTION N/A 06/15/2018   Procedure: SIGMOID COLON RESECTION;  Surgeon: [name], MD;  [facility];  Service: General;  Laterality: N/A;  . COLONOSCOPY    . COLOSTOMY N/A 06/15/2018   Procedure: COLOSTOMY;  Surgeon: [name], MD;  [facility];  Service: General;  Laterality: N/A;  . COLOSTOMY TAKEDOWN N/A 01/26/2019   Procedure: LAPAROSCOPIC ASSISTED COLOSTOMY TAKEDOWN;  Surgeon: [name], MD;  Location: [place];  Service: General;  Laterality: N/A;  . EYE SURGERY Bilateral    cataract removal  . LAPAROSCOPIC LYSIS OF ADHESIONS N/A 01/26/2019   Procedure: LAPAROSCOPIC LYSIS OF ADHESIONS;  Surgeon: [name], MD;  Location: [place];  Service: General;  Laterality: N/A;  . LAPAROTOMY N/A 06/15/2018   Procedure: EXPLORATORY LAPAROTOMY;  Surgeon: [name], MD;  [facility];  Service: General;  Laterality: N/A;  . LUMBAR LAMINECTOMY/ DECOMPRESSION WITH MET-RX Right 05/05/2013   Procedure: Right Lumbar three-four Extraforaminal Microdiskectomy with  Metrex;  Surgeon: [name], MD;  Location: [place] [facility];  Service: Neurosurgery;  Laterality: Right;  Right Lumbar three-four Extraforaminal Microdiskectomy with Metrex  . LUNG BIOPSY Right 02/19/2018   Procedure: LUNG BIOPSY;  Surgeon: [name], MD;  Location: [place];  Service: Thoracic;  Laterality: Right;  . SHOULDER OPEN ROTATOR CUFF REPAIR Bilateral 2001  . TONSILLECTOMY    . VIDEO ASSISTED THORACOSCOPY Right 02/19/2018   Procedure: VIDEO ASSISTED THORACOSCOPY;  Surgeon: [name], MD;  Location: [place];  Service: Thoracic;  Laterality: Right;    There were no vitals filed for this visit.  Subjective Assessment - 04/24/19 1648    Subjective   S: I do my exercises every day twice a day.     Currently in Pain?  No/denies                   OT Treatments/Exercises (OP) - 04/24/19 1652      Exercises   Exercises  Shoulder      Shoulder Exercises: Supine   Protraction  PROM;5 reps;Strengthening;10 reps    Protraction Weight (lbs)  1    Horizontal ABduction  PROM;5 reps;Strengthening;10 reps    Horizontal ABduction Weight (lbs)  1    External Rotation  PROM;5 reps;Strengthening;10 reps    External Rotation Weight (lbs)  1    Internal Rotation  PROM;5 reps;Strengthening;10 reps    Internal Rotation Weight (lbs)  1    Flexion  PROM;5 reps;Strengthening;10 reps    Shoulder Flexion Weight (lbs)  1    ABduction  PROM;5 reps;Strengthening;10 reps    Shoulder ABduction Weight (lbs)  1      Shoulder Exercises: Standing   Protraction  AROM;10 reps    Horizontal ABduction  AROM;10 reps    External Rotation  AROM;10 reps    Internal Rotation  AROM;10 reps    Flexion  AROM;10 reps    ABduction  AROM;10 reps    Extension  Theraband;10 reps    Theraband Level (Shoulder Extension)  Level 2 (Red)    Row  Theraband;10 reps    Theraband Level (Shoulder Row)  Level 2 (Red)    Retraction  Theraband;10 reps    Theraband Level (Shoulder Retraction)  Level 2 (Red)      Shoulder Exercises: ROM/Strengthening   UBE (Upper Arm Bike)  Level 1 2' forward 2' reverse    Proximal Shoulder Strengthening, Supine  10X each no rest breaks    Proximal Shoulder Strengthening, Seated  10X each no rest breaks      Manual Therapy   Manual Therapy  Myofascial release    Manual therapy comments  completed separately from therapeutic exercises    Myofascial Release  myofascial release and manual therapy to right anterior shoulder, trapezius, and scapular regions to decrease pain and fascial restrictions and increase joint ROM             OT Education - 04/24/19 1714    Education Details  Shoulder exercises A/ROM    Person(s) Educated  Patient    Methods   Explanation;Demonstration;Handout    Comprehension  Verbalized understanding;Returned demonstration       OT Short Term Goals - 04/24/19 1712      OT SHORT TERM GOAL #1   Title  Pt will be provided with and educated on HEP for improved mobility required for ADL completion.    Time  4    Period  Weeks    Status  On-going  Target Date  05/17/19      OT SHORT TERM GOAL #2   Title  Pt will decrease RUE fascial restrictions to minimal amounts or less to improve mobilty required for reaching tasks.    Time  4    Period  Weeks    Status  On-going      OT SHORT TERM GOAL #3   Title  Pt will increase RUE strength to 5/5 to increase ability to lift and carry objects, shoot a shotgun, and fly fish without fatigue or pain.    Time  4    Period  Weeks    Status  On-going      OT SHORT TERM GOAL #4   Title  Pt will decrease pain in RUE to 2/10 or less to improve ability to sleep for 4 consecutive hours or greater.    Time  4    Period  Weeks    Status  On-going               Plan - 04/24/19 1712    Clinical Impression Statement  A: Pt reporting LUE soreness from exercises, RUE is feeling improved. Initiated myofascial release to RUE this session,  as well as passive stretching and RUE strengthening using 1# weights. Continued with scapular theraband and added UBE. Verbal cuing for form and technique.    Body Structure / Function / Physical Skills  ADL;UE functional use;Fascial restriction;Pain;IADL;Strength    Plan  P: Follow up on HEP, add x to v arms and ball on wall       Patient will benefit from skilled therapeutic intervention in order to improve the following deficits and impairments:   Body Structure / Function / Physical Skills: ADL, UE functional use, Fascial restriction, Pain, IADL, Strength       Visit Diagnosis: Acute pain of right shoulder  Other symptoms and signs involving the musculoskeletal system    Problem List Patient Active Problem List    Diagnosis Date Noted  . Colostomy in place [facility]) 01/26/2019  . Prolonged Q-T interval on ECG 06/25/2018  . 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 Sigmoid colon perforation s/p Hartmann colectomy/colostomy 06/15/2018 06/15/2018  . Perforated diverticulum of large intestine 06/15/2018  . OSA (obstructive sleep apnea) 12/13/2016  . Snoring 10/26/2016  . PAF (paroxysmal atrial fibrillation) ([place]) 08/14/2016  . ILD (interstitial lung disease) ([place]) 05/01/2016  . Lumbar stenosis 02/01/2015  . Giardia 04/14/2014  . H/O viral cardiomyopathy 04/14/2014  . Herniated nucleus pulposus, L3-4 right 05/05/2013  . Other hypertrophic cardiomyopathy ([place]) 04/19/2013  . Hyperlipidemia 04/19/2013  . Essential hypertension 04/19/2013  . Abnormal LFTs 09/16/2012  . Nausea 07/10/2011  . Chronic diarrhea 07/10/2011  . Burping 07/10/2011  . Osteoporosis 07/10/2011  . Arthritis 07/10/2011   Guadelupe Sabin, OTR/L  [phone] 04/24/2019, 5:54 PM  [facility] [address] [place], [place], [address] Phone: [phone]   Fax:  [phone]  Name: [name] MRN: [id number] Date of Birth: [birth year]

## 2019-04-24 NOTE — Patient Instructions (Signed)

## 2019-04-28 DIAGNOSIS — M25512 Pain in left shoulder: Secondary | ICD-10-CM | POA: Diagnosis not present

## 2019-04-29 ENCOUNTER — Telehealth: Payer: Self-pay | Admitting: Cardiovascular Disease

## 2019-04-29 NOTE — Telephone Encounter (Signed)
Pt c/o of Chest Pain: STAT if CP now or developed within 24 hours  1. Are you having CP right now? No  2. Are you experiencing any other symptoms (ex. SOB, nausea, vomiting, sweating)? No  3. How long have you been experiencing CP? Since about 12:00 AM last night 04/29/19  4. Is your CP continuous or coming and going? Comes and goes   5. Have you taken Nitroglycerin? No  Patient states he has been experiencing the chest pain on and off since last night & that his EKG machine is stating it may be possible Afib. ?

## 2019-04-29 NOTE — Telephone Encounter (Addendum)
Was able to have Rockney Ghee RN to walk pt through and have EKG from apple watch sent to my chart Discussed with Dr Claiborne Billings and he does not think it is afib Pt needs an appt to be evaluated . Appt made with Coletta Memos NP for 05/05/19 at 3:00 pm

## 2019-04-29 NOTE — Telephone Encounter (Signed)
Spoke with pt and has had slight chest pain about 6 times today and on one of the occasions recorded a EKG on watch and this is suggesting a fib per pt.Received tracing on my cell phone Will see if may get someone to help pt send tracings via my chart Pt on  Cartia,Metoprolol and Elquis Per pt has not been in afib for year or so Will forward tot Dr Claiborne Billings for review and recommendations ./cy

## 2019-04-30 ENCOUNTER — Telehealth (HOSPITAL_COMMUNITY): Payer: Self-pay | Admitting: Occupational Therapy

## 2019-04-30 NOTE — Telephone Encounter (Signed)
Pt cancelled for 12/21 and gave no reason, he will reschedule when he comes in tomorrow

## 2019-05-01 ENCOUNTER — Other Ambulatory Visit: Payer: Self-pay

## 2019-05-01 ENCOUNTER — Encounter (HOSPITAL_COMMUNITY): Payer: Self-pay | Admitting: Specialist

## 2019-05-01 ENCOUNTER — Ambulatory Visit (HOSPITAL_COMMUNITY): Payer: PPO | Admitting: Specialist

## 2019-05-01 DIAGNOSIS — M25511 Pain in right shoulder: Secondary | ICD-10-CM | POA: Diagnosis not present

## 2019-05-01 DIAGNOSIS — R29898 Other symptoms and signs involving the musculoskeletal system: Secondary | ICD-10-CM

## 2019-05-01 DIAGNOSIS — R1319 Other dysphagia: Secondary | ICD-10-CM

## 2019-05-01 NOTE — Therapy (Signed)
[facility] [place], [place], [address] Phone: [phone]   Fax:  [phone]  Occupational Therapy Treatment  Patient Details  Name: [name] MRN: [id number] Date of Birth: [birth year] Referring Provider (OT): Dr. Ophelia Charter   Encounter Date: 05/01/2019  OT End of Session - 05/01/19 1635    Visit Number  3    Number of Visits  5    Date for OT Re-Evaluation  05/17/19    Authorization Type  [employer]; $10 copay    Authorization Time Period  no visit limit    OT Start Time  1345    OT Stop Time  1430    OT Time Calculation (min)  45 min    Activity Tolerance  Patient tolerated treatment well    Behavior During Therapy  [facility] for tasks assessed/performed       Past Medical History:  Diagnosis Date  . Allergy to alpha-gal    2015; has been able to re-introduce red meat for the past 2 1/2 years without reaction (as of 01/21/19)  . Arthritis   . Chest pain    a. 03/2017: echo showing EF of 60-65%, no regional WMA or significant valve abnormalities. b. 03/2016: NST with no evidence of ischemia.   . Complication of anesthesia    "anaphylactic" reaction after given xylocaine for shoulder injection > 10 years ago; reported no known reaction when given marcaine (as of 01/21/19)   . Digestive problems    on Prednisone prn for this issue- diarrhea  . Dyspnea    with activity  . Dysrhythmia    irregular due to Myocarditis- takes Atenolol, Norvasc  . GERD (gastroesophageal reflux disease)   . H/O viral myocarditis    25 years ago  . Head injury, closed, with concussion    [place] LOC  . Hyperlipidemia   . Mild mitral valve prolapse    per Dr Evette Georges notes  . PAF (paroxysmal atrial fibrillation) ([place])    a. initially occuring in 02/2016. b. recurrent in 07/2016. Placed on Eliquis  . Polymyalgia rheumatica ([place])   . Pre-diabetes   . Pulmonary fibrosis ([place])   . Sleep apnea    mild sleep apnea   no CPAP     Past Surgical History:  Procedure Laterality Date  . apendectomy  1965  . APPENDECTOMY    . bone spur Bilateral 1999   feet  . CARDIAC CATHETERIZATION  2001  . CHOLECYSTECTOMY  2004  . COLON RESECTION N/A 06/15/2018   Procedure: SIGMOID COLON RESECTION;  Surgeon: [name], MD;  Location: [facility];  Service: General;  Laterality: N/A;  . COLONOSCOPY    . COLOSTOMY N/A 06/15/2018   Procedure: COLOSTOMY;  Surgeon: [name], MD;  Location: [facility];  Service: General;  Laterality: N/A;  . COLOSTOMY TAKEDOWN N/A 01/26/2019   Procedure: LAPAROSCOPIC ASSISTED COLOSTOMY TAKEDOWN;  Surgeon: [name], MD;  Location: [facility];  Service: General;  Laterality: N/A;  . EYE SURGERY Bilateral    cataract removal  . LAPAROSCOPIC LYSIS OF ADHESIONS N/A 01/26/2019   Procedure: LAPAROSCOPIC LYSIS OF ADHESIONS;  Surgeon: [name], MD;  Location: [facility];  Service: General;  Laterality: N/A;  . LAPAROTOMY N/A 06/15/2018   Procedure: EXPLORATORY LAPAROTOMY;  Surgeon: [name], MD;  Location: [facility];  Service: General;  Laterality: N/A;  . LUMBAR LAMINECTOMY/ DECOMPRESSION WITH MET-RX Right 05/05/2013   Procedure: Right Lumbar three-four Extraforaminal Microdiskectomy with  Metrex;  Surgeon: [name], MD;  Location: [facility];  Service: Neurosurgery;  Laterality: Right;  Right Lumbar three-four Extraforaminal Microdiskectomy with Metrex  . LUNG BIOPSY Right 02/19/2018   Procedure: LUNG BIOPSY;  Surgeon: [name], MD;  Location: [facility];  Service: Thoracic;  Laterality: Right;  . SHOULDER OPEN ROTATOR CUFF REPAIR Bilateral 2001  . TONSILLECTOMY    . VIDEO ASSISTED THORACOSCOPY Right 02/19/2018   Procedure: VIDEO ASSISTED THORACOSCOPY;  Surgeon: [name], MD;  Location: [facility];  Service: Thoracic;  Laterality: Right;    There were no vitals filed for this visit.  Subjective Assessment - 05/01/19 1633    Subjective   S:  My left shoulder has started bothering me.   I talked to Dr. Griffin Basil about it - he said my left shoulder was worse than my right.  Did he send you anything about it?    Pertinent History  we have not recieved any information regarding adding therapy for left shoulder, will reach out to Dr. Griffin Basil to discuss.    Currently in Pain?  Yes    Pain Score  3     Pain Location  Shoulder    Pain Orientation  Left    Pain Descriptors / Indicators  Aching         [facility] OT Assessment - 05/01/19 0001      Assessment   Medical Diagnosis  Right RC tendonitis    Referring Provider (OT)  Dr. Ophelia Charter      Precautions   Precautions  None      Restrictions   Weight Bearing Restrictions  No               OT Treatments/Exercises (OP) - 05/01/19 0001      Exercises   Exercises  Shoulder      Shoulder Exercises: Supine   Protraction  PROM;5 reps;Strengthening;10 reps    Protraction Weight (lbs)  1    Horizontal ABduction  PROM;5 reps;Strengthening;10 reps    Horizontal ABduction Weight (lbs)  1    External Rotation  PROM;5 reps;Strengthening;10 reps    External Rotation Weight (lbs)  1    Internal Rotation  PROM;5 reps;Strengthening;10 reps    Internal Rotation Weight (lbs)  1    Flexion  PROM;5 reps;Strengthening;10 reps    Shoulder Flexion Weight (lbs)  1    ABduction  PROM;5 reps;Strengthening;10 reps    Shoulder ABduction Weight (lbs)  1      Shoulder Exercises: Standing   Protraction  Strengthening;10 reps    Protraction Weight (lbs)  1    Horizontal ABduction  Strengthening;10 reps    Horizontal ABduction Weight (lbs)  1    External Rotation  Strengthening;10 reps    External Rotation Weight (lbs)  1    Internal Rotation  Strengthening;10 reps    Internal Rotation Weight (lbs)  1    Flexion  Strengthening;10 reps    Shoulder Flexion Weight (lbs)  1    ABduction  Strengthening;10 reps    Shoulder ABduction Weight (lbs)  1    Extension Limitations  resume tband next session       Shoulder Exercises:  ROM/Strengthening   Wall Wash  1'    Proximal Shoulder Strengthening, Supine  10X each no rest breaks    Proximal Shoulder Strengthening, Seated  10X each no rest breaks    Ball on Wall  1' with shoulder flexed to 90 and tehn 1' with shoulder abducted  to 90 - fatigued at 30" in abducted position.       Manual Therapy   Manual Therapy  Myofascial release    Manual therapy comments  completed separately from therapeutic exercises    Myofascial Release  myofascial release and manual therapy to right anterior shoulder, trapezius, and scapular regions to decrease pain and fascial restrictions and increase joint ROM             OT Education - 05/01/19 1634    Education Details  recommended not using a weight or tband with left shoulder while continuing use of weight and progressing strengthening with right shoulder.    Person(s) Educated  Patient    Methods  Explanation    Comprehension  Verbalized understanding       OT Short Term Goals - 04/24/19 1712      OT SHORT TERM GOAL #1   Title  Pt will be provided with and educated on HEP for improved mobility required for ADL completion.    Time  4    Period  Weeks    Status  On-going    Target Date  05/17/19      OT SHORT TERM GOAL #2   Title  Pt will decrease RUE fascial restrictions to minimal amounts or less to improve mobilty required for reaching tasks.    Time  4    Period  Weeks    Status  On-going      OT SHORT TERM GOAL #3   Title  Pt will increase RUE strength to 5/5 to increase ability to lift and carry objects, shoot a shotgun, and fly fish without fatigue or pain.    Time  4    Period  Weeks    Status  On-going      OT SHORT TERM GOAL #4   Title  Pt will decrease pain in RUE to 2/10 or less to improve ability to sleep for 4 consecutive hours or greater.    Time  4    Period  Weeks    Status  On-going               Plan - 05/01/19 1635    Clinical Impression Statement  A:  patient continues to report  left shoulder pain and improved movement and strength with right shoulde movements.  continued with 1# strengthening in supine this date and added standing, as well as scapular stability exercises.    Body Structure / Function / Physical Skills  ADL;UE functional use;Fascial restriction;Pain;IADL;Strength    Plan  P:  increase to 2# reistance, add ube.  sending order for left shoulder assessment, follow up if order recieved.       Patient will benefit from skilled therapeutic intervention in order to improve the following deficits and impairments:   Body Structure / Function / Physical Skills: ADL, UE functional use, Fascial restriction, Pain, IADL, Strength       Visit Diagnosis: Acute pain of right shoulder  Other symptoms and signs involving the musculoskeletal system  Other dysphagia    Problem List Patient Active Problem List   Diagnosis Date Noted  . Colostomy in place [facility]) 01/26/2019  . Prolonged Q-T interval on ECG 06/25/2018  . 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 Sigmoid colon perforation s/p Hartmann colectomy/colostomy 06/15/2018 06/15/2018  . Perforated diverticulum of large intestine 06/15/2018  . OSA (obstructive sleep apnea) 12/13/2016  . Snoring 10/26/2016  . PAF (paroxysmal atrial fibrillation) ([place]) 08/14/2016  .  ILD (interstitial lung disease) ([place]) 05/01/2016  . Lumbar stenosis 02/01/2015  . Giardia 04/14/2014  . H/O viral cardiomyopathy 04/14/2014  . Herniated nucleus pulposus, L3-4 right 05/05/2013  . Other hypertrophic cardiomyopathy ([place]) 04/19/2013  . Hyperlipidemia 04/19/2013  . Essential hypertension 04/19/2013  . Abnormal LFTs 09/16/2012  . Nausea 07/10/2011  . Chronic diarrhea 07/10/2011  . Burping 07/10/2011  . Osteoporosis 07/10/2011  . Arthritis 07/10/2011    Vangie Bicker, [place], OTR/L [phone]  05/01/2019, 4:41 PM  [facility] [address] [place],  [place], [address] Phone: [phone]   Fax:  [phone]  Name: [name] MRN: [id number] Date of Birth: 05/14/1946

## 2019-05-04 ENCOUNTER — Encounter (HOSPITAL_COMMUNITY): Payer: Self-pay | Admitting: Occupational Therapy

## 2019-05-04 NOTE — Progress Notes (Signed)
Cardiology Clinic Note   Patient Name: [name] Date of Encounter: 05/05/2019  Primary Care Provider:  [name], MD Primary Cardiologist:  Shelva Majestic, MD  Patient Profile    [name]. Borenstein 73 year old male presents today for follow-up of his hypertrophic cardiomyopathy, essential hypertension, paroxysmal atrial fibrillation, and obstructive sleep apnea.  Past Medical History    Past Medical History:  Diagnosis Date  . Allergy to alpha-gal    2015; has been able to re-introduce red meat for the past 2 1/2 years without reaction (as of 01/21/19)  . Arthritis   . Chest pain    a. 03/2017: echo showing EF of 60-65%, no regional WMA or significant valve abnormalities. b. 03/2016: NST with no evidence of ischemia.   . Complication of anesthesia    "anaphylactic" reaction after given xylocaine for shoulder injection > 10 years ago; reported no known reaction when given marcaine (as of 01/21/19)   . Digestive problems    on Prednisone prn for this issue- diarrhea  . Dyspnea    with activity  . Dysrhythmia    irregular due to Myocarditis- takes Atenolol, Norvasc  . GERD (gastroesophageal reflux disease)   . H/O viral myocarditis    25 years ago  . Head injury, closed, with concussion    Breif LOC  . Hyperlipidemia   . Mild mitral valve prolapse    per Dr Evette Georges notes  . PAF (paroxysmal atrial fibrillation) ([place])    a. initially occuring in 02/2016. b. recurrent in 07/2016. Placed on Eliquis  . Polymyalgia rheumatica ([place])   . Pre-diabetes   . Pulmonary fibrosis ([place])   . Sleep apnea    mild sleep apnea   no CPAP   Past Surgical History:  Procedure Laterality Date  . apendectomy  1965  . APPENDECTOMY    . bone spur Bilateral 1999   feet  . CARDIAC CATHETERIZATION  2001  . CHOLECYSTECTOMY  2004  . COLON RESECTION N/A 06/15/2018   Procedure: SIGMOID COLON RESECTION;  Surgeon: [name], MD;  Location: [facility];  Service: General;  Laterality: N/A;    . COLONOSCOPY    . COLOSTOMY N/A 06/15/2018   Procedure: COLOSTOMY;  Surgeon: [name], MD;  Location: [facility];  Service: General;  Laterality: N/A;  . COLOSTOMY TAKEDOWN N/A 01/26/2019   Procedure: LAPAROSCOPIC ASSISTED COLOSTOMY TAKEDOWN;  Surgeon: [name], MD;  Location: [place];  Service: General;  Laterality: N/A;  . EYE SURGERY Bilateral    cataract removal  . LAPAROSCOPIC LYSIS OF ADHESIONS N/A 01/26/2019   Procedure: LAPAROSCOPIC LYSIS OF ADHESIONS;  Surgeon: [name], MD;  Location: [place];  Service: General;  Laterality: N/A;  . LAPAROTOMY N/A 06/15/2018   Procedure: EXPLORATORY LAPAROTOMY;  Surgeon: [name], MD;  Location: [facility];  Service: General;  Laterality: N/A;  . LUMBAR LAMINECTOMY/ DECOMPRESSION WITH MET-RX Right 05/05/2013   Procedure: Right Lumbar three-four Extraforaminal Microdiskectomy with Metrex;  Surgeon: [name], MD;  Location: [facility];  Service: Neurosurgery;  Laterality: Right;  Right Lumbar three-four Extraforaminal Microdiskectomy with Metrex  . LUNG BIOPSY Right 02/19/2018   Procedure: LUNG BIOPSY;  Surgeon: [name], MD;  Location: [place];  Service: Thoracic;  Laterality: Right;  . SHOULDER OPEN ROTATOR CUFF REPAIR Bilateral 2001  . TONSILLECTOMY    . VIDEO ASSISTED THORACOSCOPY Right 02/19/2018   Procedure: VIDEO ASSISTED THORACOSCOPY;  Surgeon: [name], MD;  Location: [place];  Service: Thoracic;  Laterality: Right;    Allergies  Allergies  Allergen Reactions  . Lidocaine Hcl Anaphylaxis and Other (See Comments)    Xylocaine  . Xylocaine [Lidocaine] Anaphylaxis  . Codeine Nausea And Vomiting  . Crestor [Rosuvastatin Calcium] Other (See Comments)    All-over body aches Myalgias   . Statins Other (See Comments)    Muscle soreness and an aching feeling all over Myalgias  . Percocet [Oxycodone-Acetaminophen] Itching    History of Present Illness    [name] has past medical history of viral  myocarditis in 1989, at that time his ejection fraction was 25%.  It has subsequently normalized.  He also has a history of mild mitral valve prolapse.  A cardiac catheterization in 2001 showed mild mid systolic a LAD bridging.  He is also been noted to have mild T wave changes in the EKGs.  His PMH also includes hypertension, hyperlipidemia (atorvastatin, rosuvastatin intolerant) able to tolerate Zetia 10 mg daily  He is on CPAP for his obstructive sleep apnea.  He is being seen by pulmonology for diffuse parenchymal lung disease with undetermined cause.  02/2018 he underwent open lung biopsy by Dr. Roxan Hockey which showed possible early fibrotic process suggestive of UIP.  His biopsy was also evaluated at [facility] by Dr. Reggie Pile.  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  It was thought to be related to his systemic inflammatory or connective tissue disorder.  He has also undergone follow-up and rheumatic evaluation and was started on a trial of Ofev.  He was admitted to the hospital on 06/15/2018 with acute diverticulitis exploratory laparotomy and colostomy.  In May 2020 he stated that he would be able to have a colostomy takedown and would also require back surgery.  His lung disease had improved with reinstitution of CellCept.  He was last seen by Dr. Claiborne Billings on 10/13/2018.  At that time he stated his breathing had improved and he was able to walk much further which was a significant improvement from his prior fitness level.  He denied chest pain PND and orthopnea.  He was in the process of contacting Dr. Marlou Starks for his colostomy reversal at that time.  He presents the clinic today and states he feels well.  2 weeks ago he noticed some sharp chest pain with bending over.  He states that this lasted for around a day and was sharp at times.  It was not associated with physical exertion.  After the one episode of chest discomfort his symptoms resolved without  intervention.  He does not really remember any trauma, increase physical activity prior to, or other activities that may have caused his chest discomfort.  His blood pressures remain well controlled at home 120s over 70s.  He maintains a heart healthy diet.  His activity has been limited due to an L1 fracture 4 months ago, he is now wearing a lumbar brace.  He states that with the brace he has much less pain is able to function better.  He has not yet had his anastomosis but is still hopeful that it will happen in the future.  He continues to be followed by pulmonology for his chronic fibrosing pleural changes.  He denies chest pain, increased shortness of breath, lower extremity edema, fatigue, palpitations, melena, hematuria, hemoptysis, diaphoresis, weakness, presyncope, syncope, orthopnea, and PND.   Home Medications    Prior to Admission medications   Medication Sig Start Date End Date Taking? Authorizing Provider  acetaminophen (TYLENOL) 500 MG tablet Take 1,000 mg by mouth every 6 (six) hours as needed for mild pain or headache.     [provider]  albuterol (PROVENTIL HFA;VENTOLIN HFA) 108 (90 Base) MCG/ACT inhaler Inhale 2 puffs into the lungs every 4 (four) hours as needed for wheezing or shortness of breath.  12/02/17   [provider]  budesonide-formoterol (SYMBICORT) 80-4.5 MCG/ACT inhaler Inhale 2 puffs into the lungs 2 (two) times daily. Patient taking differently: Inhale 2 puffs into the lungs daily.  03/19/18   Juanito Doom, MD  cetirizine (ZYRTEC) 10 MG tablet Take 10 mg by mouth daily as needed for allergies.     [provider]  diltiazem (CARDIZEM CD) 120 MG 24 hr capsule TAKE ONE CAPSULE BY MOUTH DAILY 10/15/18   Troy Sine, MD  diphenoxylate-atropine (LOMOTIL) 2.5-0.025 MG tablet Take 1 tablet by mouth 4 (four) times daily as needed for diarrhea or loose stools.    [provider]  ELIQUIS 5 MG TABS tablet TAKE ONE TABLET (5MG TOTAL)  BY MOUTH TWODAILY Patient taking differently: Take 5 mg by mouth 2 (two) times daily.  01/28/18   Troy Sine, MD  ezetimibe (ZETIA) 10 MG tablet TAKE ONE (1) TABLET BY MOUTH EVERY DAY 04/15/19   Troy Sine, MD  fluticasone [facility]) 50 MCG/ACT nasal spray Place 2 sprays into both nostrils daily as needed for allergies or rhinitis.    [provider]  gabapentin (NEURONTIN) 300 MG capsule Take 300 mg by mouth 2 (two) times daily.  06/11/18   [provider]  methocarbamol (ROBAXIN) 750 MG tablet Take 1 tablet (750 mg total) by mouth 4 (four) times daily as needed (use for muscle cramps/pain). 01/30/19   [name], MD  metoprolol tartrate (LOPRESSOR) 25 MG tablet TAKE ONE TABLET BY MOUTH TWICE A DAY 02/25/18   Troy Sine, MD  mycophenolate (CELLCEPT) 500 MG tablet Take 2 tablets (1,000 mg total) by mouth 2 (two) times daily. 11/26/18   Juanito Doom, MD  OFEV 100 MG CAPS TAKE 1 CAPSULE TWICE A DAY 03/25/19   Julian Hy, DO  Omega-3 Fatty Acids (FISH OIL) 500 MG CAPS Take 1 capsule by mouth 2 (two) times daily.    [provider]  ondansetron (ZOFRAN) 4 MG tablet Take 4 mg by mouth every 8 (eight) hours as needed for nausea or vomiting.    [provider]  predniSONE (DELTASONE) 10 MG tablet Take 1 tablet (10 mg total) by mouth daily with breakfast. Patient taking differently: Take 7 mg by mouth daily with breakfast.  05/30/18   Juanito Doom, MD  Probiotic Product (PROBIOTIC DAILY PO) Take 1 tablet by mouth daily.    [provider]  sulfamethoxazole-trimethoprim (BACTRIM DS) 800-160 MG tablet TAKE ONE TABLET BY MOUTH TWICE A DAY ON MONDAY, WEDNESDAY, AND FRIDAY 04/20/19   Noemi Chapel P, DO  tamsulosin (FLOMAX) 0.4 MG CAPS capsule Take 0.4 mg by mouth 2 (two) times daily.     [provider]    Family History    Family History  Problem Relation Age of Onset  . Rheum arthritis Mother   . Thyroid cancer Mother   .  Heart disease Father   . Asthma Father   . Thyroid cancer Sister   . Melanoma Brother    He indicated that his mother is deceased. He indicated that his father is deceased. He indicated that his sister is alive. He  indicated that both of his brothers are alive.  Social History    Social History   Socioeconomic History  . Marital status: Married    Spouse name: Not on file  . Number of children: Not on file  . Years of education: Not on file  . Highest education level: Not on file  Occupational History  . Not on file  Tobacco Use  . Smoking status: Never Smoker  . Smokeless tobacco: Never Used  Substance and Sexual Activity  . Alcohol use: Yes    Alcohol/week: 21.0 standard drinks    Types: 21 Glasses of wine per week    Comment: 2/3 glasses wine in evening  . Drug use: No  . Sexual activity: Yes    Birth control/protection: Other-see comments    Comment: old age  Other Topics Concern  . Not on file  Social History Narrative  . Not on file   Social Determinants of Health   Financial Resource Strain:   . Difficulty of Paying Living Expenses: Not on file  Food Insecurity:   . Worried About Charity fundraiser in the Last Year: Not on file  . Ran Out of Food in the Last Year: Not on file  Transportation Needs:   . Lack of Transportation (Medical): Not on file  . Lack of Transportation (Non-Medical): Not on file  Physical Activity:   . Days of Exercise per Week: Not on file  . Minutes of Exercise per Session: Not on file  Stress:   . Feeling of Stress : Not on file  Social Connections:   . Frequency of Communication with Friends and Family: Not on file  . Frequency of Social Gatherings with Friends and Family: Not on file  . Attends Religious Services: Not on file  . Active Member of Clubs or Organizations: Not on file  . Attends Archivist Meetings: Not on file  . Marital Status: Not on file  Intimate Partner Violence:   . Fear of Current or  Ex-Partner: Not on file  . Emotionally Abused: Not on file  . Physically Abused: Not on file  . Sexually Abused: Not on file     Review of Systems    General:  No chills, fever, night sweats or weight changes.  Cardiovascular:  No chest pain, dyspnea on exertion, edema, orthopnea, palpitations, paroxysmal nocturnal dyspnea. Dermatological: No rash, lesions/masses Respiratory: No cough, dyspnea Urologic: No hematuria, dysuria Abdominal:   No nausea, vomiting, diarrhea, bright red blood per rectum, melena, or hematemesis Neurologic:  No visual changes, wkns, changes in mental status. All other systems reviewed and are otherwise negative except as noted above.  Physical Exam    VS:  BP 140/78 (BP Location: Right Arm)   Pulse 92   Temp (!) 97.1 F (36.2 C)   Resp (!) 22   Ht _0  (1.753 m)   Wt 183 lb (83 kg)   SpO2 95%   BMI 27.02 kg/m  , BMI Body mass index is 27.02 kg/m. GEN: Well nourished, well developed, in no acute distress. HEENT: normal. Neck: Supple, no JVD, carotid bruits, or masses. Cardiac: RRR, no murmurs, rubs, or gallops. No clubbing, cyanosis, edema.  Radials/DP/PT 2+ and equal bilaterally.  Respiratory:  Respirations regular and unlabored, clear to auscultation bilaterally. GI: Soft, nontender, nondistended, BS + x 4. MS: no deformity or atrophy. Skin: warm and dry, no rash. Neuro:  Strength and sensation are intact. Psych: Normal affect.  Accessory Clinical Findings  ECG personally reviewed by me today- normal sinus rhythm RSR or QR pattern in V1 suggests right ventricular conduction delay 90 bpm  - No acute changes  EKG 10/13/2018 Normal sinus rhythm incomplete right bundle branch block 70 bpm  Echocardiogram 03/22/2016 Study Conclusions  - Left ventricle: The cavity size was normal. Wall thickness was   normal. Systolic function was normal. The estimated ejection   fraction was in the range of 60% to 65%. Wall motion was normal;   there were no  regional wall motion abnormalities. Doppler   parameters are consistent with abnormal left ventricular   relaxation (grade 1 diastolic dysfunction). - Aortic valve: There was no stenosis. - Aorta: Mildly dilated aortic root. Aortic root dimension: 38 mm   (ED). - Mitral valve: There was no significant regurgitation. - Right ventricle: The cavity size was normal. Systolic function   was normal. - Pulmonary arteries: No complete TR doppler jet so unable to   estimate PA systolic pressure. - Inferior vena cava: The vessel was normal in size. The   respirophasic diameter changes were in the normal range (>= 50%),   consistent with normal central venous pressure.  Myocardial perfusion study 03/21/2016  The left ventricular ejection fraction is normal (55-65%).  Nuclear stress EF: 61%.  The study is normal.  This is a low risk study.  There was no ST segment deviation noted during stress.   Assessment & Plan   1.  Coronary artery disease-no chest pain today .Had one day of chest pain two weeks ago.  Cardiac catheterization in 2001 showed mild midsystolic LAD bridging.  Myocardial perfusion study 03/2016 was low risk. Continue metoprolol 25 twice daily Continue omega-3 fatty acids Heart healthy low-sodium diet Increase physical activity as tolerated  Essential hypertension-BP today 140/78.  Well controlled at home 120s to 130s over 41s. Continue diltiazem 120 mg tablet daily  Continue metoprolol tartrate 25 mg tablet twice daily Heart healthy low-sodium diet Increase physical activity as tolerated  Paroxysmal atrial fibrillation-EKG today shows normal sinus rhythm RSR or QR pattern in V1 suggests right ventricular conduction delay 90 bpm Continue Cardizem 120 mg Continue Eliquis 5 mg twice daily Continue metoprolol tartrate 25 mg twice daily Avoid triggers caffeine, chocolate, EtOH etc.  Hyperlipidemia-LDL 83 07/16/2016, LDL 156 on 04/15/2019 at his PCP.  Offered referral to  lipid clinic.  He would like to increase the fiber in his diet at this time and does not want a referral to have his cholesterol further reviewed. Continue ezetimibe 10 mg tablet daily  Heart healthy low-sodium high-fiber diet Increase physical activity as tolerated Monitored by PCP  History of myocarditis-echocardiogram 03/2016 showed EF 60-65%   Fibrosing interstitial pneumonitis-no increased work of breathing today Followed by [name] pulmonology/Dr. Noemi Chapel   Disposition: Follow-up with Dr. Claiborne Billings in 12 months.   Jossie Ng. [facility] [place] [address] Office [phone] Fax [phone]

## 2019-05-05 ENCOUNTER — Ambulatory Visit: Payer: PPO | Admitting: General Practice

## 2019-05-05 ENCOUNTER — Encounter: Payer: Self-pay | Admitting: General Practice

## 2019-05-05 ENCOUNTER — Other Ambulatory Visit: Payer: Self-pay

## 2019-05-05 VITALS — BP 140/78 | HR 92 | Temp 97.1°F | Resp 22 | Ht 69.0 in | Wt 183.0 lb

## 2019-05-05 DIAGNOSIS — D7589 Other specified diseases of blood and blood-forming organs: Secondary | ICD-10-CM

## 2019-05-05 DIAGNOSIS — J8489 Other specified interstitial pulmonary diseases: Secondary | ICD-10-CM

## 2019-05-05 DIAGNOSIS — E785 Hyperlipidemia, unspecified: Secondary | ICD-10-CM | POA: Diagnosis not present

## 2019-05-05 DIAGNOSIS — I2510 Atherosclerotic heart disease of native coronary artery without angina pectoris: Secondary | ICD-10-CM

## 2019-05-05 DIAGNOSIS — I10 Essential (primary) hypertension: Secondary | ICD-10-CM

## 2019-05-05 DIAGNOSIS — I480 Paroxysmal atrial fibrillation: Secondary | ICD-10-CM | POA: Diagnosis not present

## 2019-05-05 NOTE — Patient Instructions (Signed)
Reduce your risk of getting COVID-19 With your heart disease it is especially important for people at increased risk of severe illness from COVID-19, and those who live with them, to protect themselves from getting COVID-19. The best way to protect yourself and to help reduce the spread of the virus that causes COVID-19 is to: Marland Kitchen Limit your interactions with other people as much as possible. . Take precautions to prevent getting COVID-19 when you do interact with others. If you start feeling sick and think you may have COVID-19, get in touch with your healthcare provider within 24 hours.  Follow-Up: IN 12 months Please call our office 2 months in advance, OCT 2021 to schedule this [place] 2021 appointment. In Person You may see Shelva Majestic, MD Coletta Memos, FNP or one of the following Advanced Practice Providers on your designated Care Team:  [name], PA-C Fabian Sharp, PA-C or [place], [place].    At [facility], you and your health needs are our priority.  As part of our continuing mission to provide you with exceptional heart care, we have created designated Provider Care Teams.  These Care Teams include your primary Cardiologist (physician) and Advanced Practice Providers (APPs -  Physician Assistants and Nurse Practitioners) who all work together to provide you with the care you need, when you need it.  Thank you for choosing [facility] at [facility]!!     Happy Holidays!!

## 2019-05-11 ENCOUNTER — Telehealth (HOSPITAL_COMMUNITY): Payer: Self-pay | Admitting: Occupational Therapy

## 2019-05-11 NOTE — Telephone Encounter (Signed)
pt called to cancel tomorrow's appt due to he has a lot going on at home with family

## 2019-05-12 ENCOUNTER — Ambulatory Visit (HOSPITAL_COMMUNITY): Payer: PPO | Admitting: Occupational Therapy

## 2019-05-12 ENCOUNTER — Other Ambulatory Visit: Payer: Self-pay | Admitting: Pulmonary Disease

## 2019-05-12 DIAGNOSIS — H35362 Drusen (degenerative) of macula, left eye: Secondary | ICD-10-CM | POA: Diagnosis not present

## 2019-05-19 ENCOUNTER — Other Ambulatory Visit: Payer: Self-pay | Admitting: Critical Care Medicine

## 2019-05-19 ENCOUNTER — Encounter (HOSPITAL_COMMUNITY): Payer: Self-pay | Admitting: Occupational Therapy

## 2019-05-19 ENCOUNTER — Ambulatory Visit (HOSPITAL_COMMUNITY): Payer: PPO | Attending: Orthopaedic Surgery | Admitting: Occupational Therapy

## 2019-05-19 ENCOUNTER — Other Ambulatory Visit: Payer: Self-pay

## 2019-05-19 DIAGNOSIS — R29898 Other symptoms and signs involving the musculoskeletal system: Secondary | ICD-10-CM | POA: Diagnosis not present

## 2019-05-19 DIAGNOSIS — M25511 Pain in right shoulder: Secondary | ICD-10-CM | POA: Diagnosis not present

## 2019-05-19 DIAGNOSIS — R1319 Other dysphagia: Secondary | ICD-10-CM | POA: Diagnosis not present

## 2019-05-19 DIAGNOSIS — M25512 Pain in left shoulder: Secondary | ICD-10-CM | POA: Diagnosis not present

## 2019-05-19 NOTE — Patient Instructions (Signed)

## 2019-05-19 NOTE — Telephone Encounter (Signed)
Dr. Carlis Abbott  Mr. Nevils is on Bactrim and I just need some clarification on the order. It says he is supposed to take 1 tablet twice a day on Monday, Wednesday, Friday. But he was only prescribed 12 tablets with 1 refill which is only 1 tablet once a day.   Would you like him to take it once a day or twice a day? Also does he need to stay on this till his follow up with you in March?  Please advise

## 2019-05-19 NOTE — Therapy (Signed)
[place], [place], [address] Phone: [phone]   Fax:  [phone]  Occupational Therapy Reassessment, Treatment (recertification)  Patient Details  Name: [name] MRN: [id number] Date of Birth: Dec 15, 1945 Referring Provider (OT): Dr. Ophelia Charter   Encounter Date: 05/19/2019  OT End of Session - 05/19/19 1731    Visit Number  4    Number of Visits  9    Date for OT Re-Evaluation  06/18/19    Authorization Type  [employer]; $10 copay    Authorization Time Period  progress note by 10th visit    Authorization - Visit Number  4    Authorization - Number of Visits  10    OT Start Time  6503    OT Stop Time  1729    OT Time Calculation (min)  39 min    Activity Tolerance  Patient tolerated treatment well    Behavior During Therapy  [facility] for tasks assessed/performed       Past Medical History:  Diagnosis Date  . Allergy to alpha-gal    2015; has been able to re-introduce red meat for the past 2 1/2 years without reaction (as of 01/21/19)  . Arthritis   . Chest pain    a. 03/2017: echo showing EF of 60-65%, no regional WMA or significant valve abnormalities. b. 03/2016: NST with no evidence of ischemia.   . Complication of anesthesia    "anaphylactic" reaction after given xylocaine for shoulder injection > 10 years ago; reported no known reaction when given marcaine (as of 01/21/19)   . Digestive problems    on Prednisone prn for this issue- diarrhea  . Dyspnea    with activity  . Dysrhythmia    irregular due to Myocarditis- takes Atenolol, Norvasc  . GERD (gastroesophageal reflux disease)   . H/O viral myocarditis    25 years ago  . Head injury, closed, with concussion    [place] LOC  . Hyperlipidemia   . Mild mitral valve prolapse    per Dr Evette Georges notes  . PAF (paroxysmal atrial fibrillation) ([place])    a. initially occuring in 02/2016. b. recurrent in 07/2016. Placed on Eliquis  .  Polymyalgia rheumatica ([place])   . Pre-diabetes   . Pulmonary fibrosis ([place])   . Sleep apnea    mild sleep apnea   no CPAP    Past Surgical History:  Procedure Laterality Date  . apendectomy  1965  . APPENDECTOMY    . bone spur Bilateral 1999   feet  . CARDIAC CATHETERIZATION  2001  . CHOLECYSTECTOMY  2004  . COLON RESECTION N/A 06/15/2018   Procedure: SIGMOID COLON RESECTION;  Surgeon: [name], MD;  Location: [facility];  Service: General;  Laterality: N/A;  . COLONOSCOPY    . COLOSTOMY N/A 06/15/2018   Procedure: COLOSTOMY;  Surgeon: [name], MD;  Location: [facility];  Service: General;  Laterality: N/A;  . COLOSTOMY TAKEDOWN N/A 01/26/2019   Procedure: LAPAROSCOPIC ASSISTED COLOSTOMY TAKEDOWN;  Surgeon: [name], MD;  Location: [place];  Service: General;  Laterality: N/A;  . EYE SURGERY Bilateral    cataract removal  . LAPAROSCOPIC LYSIS OF ADHESIONS N/A 01/26/2019   Procedure: LAPAROSCOPIC LYSIS OF ADHESIONS;  Surgeon: [name], MD;  Location: [place];  Service: General;  Laterality: N/A;  . LAPAROTOMY N/A 06/15/2018   Procedure: EXPLORATORY LAPAROTOMY;  Surgeon: [name], MD;  Location: [facility];  Service: General;  Laterality: N/A;  . LUMBAR LAMINECTOMY/ DECOMPRESSION WITH MET-RX Right 05/05/2013   Procedure: Right Lumbar three-four Extraforaminal Microdiskectomy with Metrex;  Surgeon: [name], MD;  Location: [place] [facility];  Service: Neurosurgery;  Laterality: Right;  Right Lumbar three-four Extraforaminal Microdiskectomy with Metrex  . LUNG BIOPSY Right 02/19/2018   Procedure: LUNG BIOPSY;  Surgeon: [name], MD;  Location: [place];  Service: Thoracic;  Laterality: Right;  . SHOULDER OPEN ROTATOR CUFF REPAIR Bilateral 2001  . TONSILLECTOMY    . VIDEO ASSISTED THORACOSCOPY Right 02/19/2018   Procedure: VIDEO ASSISTED THORACOSCOPY;  Surgeon: [name], MD;  Location: [facility];  Service: Thoracic;  Laterality: Right;    There were no vitals  filed for this visit.  Subjective Assessment - 05/19/19 1653    Subjective   S: The more I use it the more it hurts.    Currently in Pain?  No/denies         [facility] OT Assessment - 05/19/19 1653      Assessment   Medical Diagnosis  Right RC tendonitis    Referring Provider (OT)  Dr. Ophelia Charter      Precautions   Precautions  None      Palpation   Palpation comment  min fascial restrictions in right upper arm/anterior shoulder and trapezius regions      AROM   Overall AROM Comments  Assessed seated, er/IR adducted    AROM Assessment Site  Shoulder    Right/Left Shoulder  Right    Right Shoulder Flexion  180 Degrees   170 previous   Right Shoulder ABduction  180 Degrees   same as previous   Right Shoulder Internal Rotation  90 Degrees   same as previous   Right Shoulder External Rotation  78 Degrees   71 previous     PROM   Overall PROM   Within functional limits for tasks performed    PROM Assessment Site  Shoulder    Right/Left Shoulder  Right      Strength   Overall Strength Comments  Assessed seated, er/IR adducted    Strength Assessment Site  Shoulder    Right/Left Shoulder  Right    Right Shoulder Flexion  5/5   same as previous   Right Shoulder ABduction  4+/5   4/5 previous   Right Shoulder Internal Rotation  5/5   same as previous   Right Shoulder External Rotation  4+/5   4/5 previous              OT Treatments/Exercises (OP) - 05/19/19 1653      Exercises   Exercises  Shoulder      Shoulder Exercises: Supine   Protraction  PROM;5 reps;Strengthening;10 reps    Horizontal ABduction  PROM;5 reps;Strengthening;10 reps    External Rotation  PROM;5 reps;Strengthening;10 reps    Internal Rotation  PROM;5 reps;Strengthening;10 reps    Flexion  PROM;5 reps;Strengthening;10 reps    ABduction  PROM;5 reps;Strengthening;10 reps      Shoulder Exercises: Standing   Protraction  Strengthening;10 reps    Protraction Weight (lbs)  1    Horizontal  ABduction  Strengthening;10 reps    Horizontal ABduction Weight (lbs)  1    External Rotation  Strengthening;10 reps    External Rotation Weight (lbs)  1    Internal Rotation  Strengthening;10 reps    Flexion  Strengthening;10 reps    Shoulder Flexion Weight (lbs)  1    ABduction  Strengthening;10 reps    Shoulder ABduction Weight (lbs)  1      Shoulder Exercises: ROM/Strengthening   Ball on Wall  1' with shoulder flexed to 90 and then 1' with shoulder abducted to 90 - fatigued at 30" in abducted position.       Manual Therapy   Manual Therapy  Myofascial release    Manual therapy comments  completed separately from therapeutic exercises    Myofascial Release  myofascial release and manual therapy to right anterior shoulder, trapezius, and scapular regions to decrease pain and fascial restrictions and increase joint ROM             OT Education - 05/19/19 1716    Education Details  reissued A/ROM and instructed pt to use a 1# weight (soup can)    Person(s) Educated  Patient    Methods  Explanation    Comprehension  Verbalized understanding       OT Short Term Goals - 05/19/19 1732      OT SHORT TERM GOAL #1   Title  Pt will be provided with and educated on HEP for improved mobility required for ADL completion.    Time  4    Period  Weeks    Status  On-going    Target Date  05/17/19      OT SHORT TERM GOAL #2   Title  Pt will decrease RUE fascial restrictions to minimal amounts or less to improve mobilty required for reaching tasks.    Time  4    Period  Weeks    Status  Achieved      OT SHORT TERM GOAL #3   Title  Pt will increase RUE strength to 5/5 to increase ability to lift and carry objects, shoot a shotgun, and fly fish without fatigue or pain.    Time  4    Period  Weeks    Status  Partially Met      OT SHORT TERM GOAL #4   Title  Pt will decrease pain in RUE to 2/10 or less to improve ability to sleep for 4 consecutive hours or greater.    Time  4     Period  Weeks    Status  On-going               Plan - 05/19/19 1732    Clinical Impression Statement  A: Reassessment completed this session, pt has met 1 goal and has ROM WNL in all planes today. Pt reports increased soreness of RUE since last visit, also reports increased use of RUE due to LUE pain. Discussed possibility that RUE is compensating for LUE therefore soreness has increased. Pt reports pain is mostly at night, pt is sleeping on stomach with RUE overhead and elbow at 90 degrees pushing ROM into extended position. Encouraged pt to try and sleep with arm down by his side versus overhead to improve comfort of RUE. Session focusing on RUE stability and strengthening, occasional verbal cuing for form and technique. Pt requiring rest breaks during ball on wall due to increased pain.    OT Occupational Profile and History  Problem Focused Assessment - Including review of records relating to presenting problem    Occupational performance deficits (Please refer to evaluation for details):  ADL's;IADL's;Rest and Sleep;Work;Leisure    Body Structure / Function / Physical Skills  ADL;UE functional use;Fascial restriction;Pain;IADL;Strength    Rehab Potential  Good    Clinical Decision Making  Limited treatment options,  no task modification necessary    Comorbidities Affecting Occupational Performance:  None    Modification or Assistance to Complete Evaluation   No modification of tasks or assist necessary to complete eval    OT Frequency  1x / week    OT Duration  4 weeks    OT Treatment/Interventions  Self-care/ADL training;Ultrasound;Patient/family education;Passive range of motion;Cryotherapy;Electrical Stimulation;Moist Heat;Therapeutic exercise;Manual Therapy;Therapeutic activities    Plan  P: Follow up on referral for LUE to MD. Continue with skilled OT services working on decreasing pain and improve strength and stability in RUE required for functional tasks.    Consulted and Agree  with Plan of Care  Patient       Patient will benefit from skilled therapeutic intervention in order to improve the following deficits and impairments:   Body Structure / Function / Physical Skills: ADL, UE functional use, Fascial restriction, Pain, IADL, Strength       Visit Diagnosis: Acute pain of right shoulder  Other symptoms and signs involving the musculoskeletal system    Problem List Patient Active Problem List   Diagnosis Date Noted  . Colostomy in place [facility]) 01/26/2019  . Prolonged Q-T interval on ECG 06/25/2018  . 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 Sigmoid colon perforation s/p Hartmann colectomy/colostomy 06/15/2018 06/15/2018  . Perforated diverticulum of large intestine 06/15/2018  . OSA (obstructive sleep apnea) 12/13/2016  . Snoring 10/26/2016  . PAF (paroxysmal atrial fibrillation) ([place]) 08/14/2016  . ILD (interstitial lung disease) ([place]) 05/01/2016  . Lumbar stenosis 02/01/2015  . Giardia 04/14/2014  . H/O viral cardiomyopathy 04/14/2014  . Herniated nucleus pulposus, L3-4 right 05/05/2013  . Other hypertrophic cardiomyopathy ([place]) 04/19/2013  . Hyperlipidemia 04/19/2013  . Essential hypertension 04/19/2013  . Abnormal LFTs 09/16/2012  . Nausea 07/10/2011  . Chronic diarrhea 07/10/2011  . Burping 07/10/2011  . Osteoporosis 07/10/2011  . Arthritis 07/10/2011   Guadelupe Sabin, OTR/L  [phone] 05/19/2019, 5:36 PM  [place] [address] [place], [place], [address] Phone: [phone]   Fax:  [phone]  Name: [name] MRN: [id number] Date of Birth: 1946/01/26

## 2019-05-20 ENCOUNTER — Telehealth: Payer: Self-pay | Admitting: Critical Care Medicine

## 2019-05-20 NOTE — Telephone Encounter (Signed)
This is a once daily medication 3 days per week- M, W, F. 12 pills per month, 5 refills. Not sure why it was ordered BID, but it is only needed ONCE daily those 3 days.  LPC

## 2019-05-20 NOTE — Telephone Encounter (Signed)
Called and spoke with pt's pharmacy who wanted to know if we wanted to change the quantity dispensed for pt's bactrim due to the fact that the instructions have for pt to take twice a day three days weekly.  Current quantity dispensed is #12. Pt does have 5 additional refills but pharmacy wanted to know if we wanted to increase the quantity to try to save pt from having to go to the pharmacy so often to pick up the med, especially with him having a lung disease.  Dr. Carlis Abbott, please advise on this. Thanks!

## 2019-05-20 NOTE — Telephone Encounter (Signed)
So he is on PJP prophylaxis. People on immunosuppression and with advanced AIDS need this, and it is just as effective 3 days per week as daily. So 12 pills per month is correct and it is once daily on M, W, F. The pharmacies know what this script is. He should have 5 refills because he will be on this as long as he is on the mycophenolate.  [name]

## 2019-05-21 NOTE — Telephone Encounter (Signed)
Called pt's pharmacy and spoke with Tammy letting her know that instructions needed to be changed on pt's bactrim Rx for him to take it once daily on Monday,  Wednesday, Friday. Tammy verbalized understanding and stated she would get the instructions taken care of. Nothing further needed.

## 2019-05-22 ENCOUNTER — Other Ambulatory Visit: Payer: Self-pay | Admitting: Cardiovascular Disease

## 2019-05-26 ENCOUNTER — Ambulatory Visit: Payer: PPO | Admitting: Cardiovascular Disease

## 2019-05-26 ENCOUNTER — Other Ambulatory Visit: Payer: Self-pay

## 2019-05-26 ENCOUNTER — Encounter: Payer: Self-pay | Admitting: Cardiovascular Disease

## 2019-05-26 VITALS — BP 124/62 | HR 87 | Temp 96.6°F | Ht 69.0 in | Wt 186.0 lb

## 2019-05-26 DIAGNOSIS — G4733 Obstructive sleep apnea (adult) (pediatric): Secondary | ICD-10-CM

## 2019-05-26 DIAGNOSIS — E782 Mixed hyperlipidemia: Secondary | ICD-10-CM | POA: Diagnosis not present

## 2019-05-26 DIAGNOSIS — I2510 Atherosclerotic heart disease of native coronary artery without angina pectoris: Secondary | ICD-10-CM | POA: Diagnosis not present

## 2019-05-26 DIAGNOSIS — J849 Interstitial pulmonary disease, unspecified: Secondary | ICD-10-CM

## 2019-05-26 DIAGNOSIS — Z7901 Long term (current) use of anticoagulants: Secondary | ICD-10-CM

## 2019-05-26 DIAGNOSIS — I480 Paroxysmal atrial fibrillation: Secondary | ICD-10-CM | POA: Diagnosis not present

## 2019-05-26 DIAGNOSIS — I10 Essential (primary) hypertension: Secondary | ICD-10-CM | POA: Diagnosis not present

## 2019-05-26 DIAGNOSIS — Z9989 Dependence on other enabling machines and devices: Secondary | ICD-10-CM

## 2019-05-26 DIAGNOSIS — Z8679 Personal history of other diseases of the circulatory system: Secondary | ICD-10-CM | POA: Diagnosis not present

## 2019-05-26 MED ORDER — NEXLIZET 180-10 MG PO TABS: 1.0000 mg | ORAL_TABLET | Freq: Every day | ORAL | 3 refills | Status: DC

## 2019-05-26 NOTE — Progress Notes (Signed)
Patient ID: [name], male   DOB: 1946-05-13, 74 y.o.   MRN: [id number]    Primary M.D.: Dr. Wende Neighbors  HPI: Samyak Sackmann Bhakta is a 74 y.o. male who presents for a 7 month cardiology follow-up evaluation.   Mr. Caggiano has remote history of viral myocarditis in 1989 at which time his ejection fraction was 25%. He subsequently normalized LV function. He has a history of mild mitral valve prolapse. Cardiac catheterization in 2001 showed mild mid systolic LAD bridging. He has been noted to have mild T-wave changes on his ECG. Additional problems include hypertension as well as hyperlipidemia. He was unable to tolerate Lipitor. He initially tolerated Crestor but then developed significant myalgias.  He has been able to tolerate Zetia 10 mg daily.   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  Apparently, this has stabilized and is tired.  Ears have almost normalized.  He has been starting to resume very small amounts of red meat.    Past year, he has remained active.  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  He denies any episodes of chest pain, or change in exercise tolerance.  On 02/01/2015.  He underwent lumbar surgery by [name] involving L2-L3, L3-L4, and L4-L5.  He tolerated surgery without cardiovascular compromise.  When I  saw him in January 2017 he was on amlodipine 2.5 mg and atenolol 25 g twice a day, which has been helpful for his blood pressure, palpitations, as well as documented systolic LAD muscle bridging.    At a subsequent office visit in November 2017 he underwent an echo Doppler study which was done on 03/22/2016 and this showed normal systolic function with an EF of 60-65% with grade 1 diastolic dysfunction.  His aorta was upper normal to mildly dilated at 38 mm.  There was no significant valvular pathology.  There was no TR Doppler jets an estimated PA systolic pressure was not able to be obtained.   Due to his shortness of breath.  He also underwent a CT of his chest.  He had dependent linear and platelike atelectasis in lower lungs bilaterally.  The patient has a remote exposure to asbestosis in his 23s, but his CT scan did not reveal any evidence for pleural based calcification.  There were diffuse atelectatic changes in the lungs bilaterally.  He also underwent a nuclear perfusion study which showed normal perfusion and function without scar or ischemia.  I had sent off laboratory was notable for a normal BMP 53.  Troponins were negative.  He had been prescribed levaquin by his primary physician as empiric therapy for a mild fever.  His fever resolved but he continues to experience shortness of breath.  His white blood count 13 days ago was 11.2.  He was macrocytic with an MCV of 102 with a normal hemoglobin at 16.2, hematocrit of 46.0.  B12 and folate levels were normal.  An erythrocyte sedimentation rate was increased at 61 and a high-sensitivity C-reactive protein was significantly increased at 139.    I referred him to [name] for pulmonary evaluation for possible interstitial lung disease and also [name] for rheumatologic follow-up.  He was given increased steroids.  In November 2017.  A high resolution CT scan of the chest showed patchy groundglass bilaterally andpatchy lower lobe atelectasis.  A barium swallow did not show evidence for esophageal disorder.  Antineutrophil cytoplasmic antibody testing was negative.  A follow-up high-resolution CT was done on June 12, 2016, which showed mild basilar predominant subpleural reticulation and groundglass suggesting nonspecific interstitial pneumonitis, however, will interstitial pneumonitis could not be excluded.  He was also noted to have aortic atherosclerosis and a punctate stone in his left kidney.  When seen in 2018 he felt improved.  Subsequent laboratory had shown a sedimentation rate which improved from 61 to  1 and CRP which had  improved from 139 to 6.8.  His lipids were elevated with a total cholesterol 222, triglycerides 359, HDL 67, VLDL 72, and LDL 83.  He developed myalgias with Crestor and Lipitor.  He denies chest pain. He is concerned about the cost of soe ofhis medicatios, partiyby.  He continues to be on pradaxa for anticoagulation and long-acting Cardizem.  He is unaware of any recent arrhythmia.  He had follow-up blood work on 01/15/2017 by Wende Neighbors in [place].  Hemoglobin 17.5, hematocrit 48.7.  Lipid studies were improved with a total cholesterol 220, triglycerides 175, HDL 72, LDL 113.  Hemoglobin A1c was 5.7.  He is on CPAP therapy.  He nasal pillows, small size, the D[employer] [facility].  He has undergone more extensive pulmonary evaluation for his diffuse parenchymal lung disease of undetermined cause.  The February 19, 2018 he underwent an open lung biopsy by Dr. Roxan Hockey and there was some suggestion of possible early fibrotic process suggestive of UIP.  Subsequently, his biopsy was evaluated at [facility] by Dr. Reggie Pile.  He was felt to have chronic fibrosing interstitial pneumonia with an unclassifiable pattern.  They did query the possibility of association with an underlying systemic inflammatory or connective tissue disorder.  They did not believe he had UIP. He has also undergone follow-up rheumatologic evaluation.  He was started on a trial of Ofev.  When I  saw him in December 2019 he denied any chest pain or palpitations, PND orthopnea,  presyncope or syncope. He was not short of breath at rest but does get short of breath with mild activity. He was on chronic steroids and inhalers and was on Cellcept and Ofev, followed by Dr Lake Bells.  He was admitted to the hospital on 06/15/2018 with acute diverticulitis requiring exploratory laparotomy and colostomy.The pt was taken off Cellcept and Ofev on admissionPostoperatively his QTC was noted to be prolonged-his QTC on 06/24/2018 was 624.  Antibiotics were adjusted.He saw Kerin Ransom, [facility]  for further evaluation on 06/25/2018. and f/u EKG. His QTC  in the office was 447. He is slowly improving. No palpitations or tachycardia, no unusual dyspnea. He has noted his B/P has been running TIR-44'R systolic.  I evaluated  him in May 2020 with a telemedicine visit.  At that time, he told me that he will be in need for reversal of his colostomy with colostomy takedown and sometime this year will also require back surgery by Dr. Ellene Route.  His lung disease has improved with reinstitution of CellCept.  He denied any chest pain PND orthopnea.  He denies any palpitations.  He was seen 1 month later for follow-up evaluation and his breathing was significantly improved.  He was able to walk for significant duration recently went and noticed significant improvement from previously.  He is scheduled to see his primary physician later this week and had laboratory drawn on Oct 09, 2018.  Hemoglobin 16.1 hematocrit 47.5.  MCV had improved to 102 from a high of 110.  Renal function was stable with a creatinine of 0.83.  LFTs  were normal.  Total cholesterol was increased to 260 but triglycerides were improved at 145, LDL was 115 HDL cholesterol was excellent at 116.  Hemoglobin A1c was 6.0.  Since I last saw him, he underwent successful reversal of his colostomy with Dr. Marlou Starks.  He was evaluated in December 2020 by Coletta Memos, NP after experiencing episodes of some sharp chest pain with bending over.  He was not felt to have ischemic chest pain.  His activity has been limited due to an L1 fracture and he has been wearing a lumbar brace.  He has been followed by Dr. Ellene Route.  He will be establishing pulmonary care with No[facility] since [name] has mainly been at the [facility].  He is unaware of recurrent atrial fibrillation has been maintained on Cartia XT 120 mg, Quist 5 mg twice a day.  He continues to be on Ofev for his interstitial lung disease.  He  had been given some samples of combination Zetia and bempedoic acid by Dr. Nevada Crane but had run out of these and was concerned about potential cost.  The cannot tolerate statins.  He presents for evaluation.  Past Medical History:  Diagnosis Date  . Allergy to alpha-gal    2015; has been able to re-introduce red meat for the past 2 1/2 years without reaction (as of 01/21/19)  . Arthritis   . Chest pain    a. 03/2017: echo showing EF of 60-65%, no regional WMA or significant valve abnormalities. b. 03/2016: NST with no evidence of ischemia.   . Complication of anesthesia    "anaphylactic" reaction after given xylocaine for shoulder injection > 10 years ago; reported no known reaction when given marcaine (as of 01/21/19)   . Digestive problems    on Prednisone prn for this issue- diarrhea  . Dyspnea    with activity  . Dysrhythmia    irregular due to Myocarditis- takes Atenolol, Norvasc  . GERD (gastroesophageal reflux disease)   . H/O viral myocarditis    25 years ago  . Head injury, closed, with concussion    Breif LOC  . Hyperlipidemia   . Mild mitral valve prolapse    per Dr Evette Georges notes  . PAF (paroxysmal atrial fibrillation) ([place])    a. initially occuring in 02/2016. b. recurrent in 07/2016. Placed on Eliquis  . Polymyalgia rheumatica ([place])   . Pre-diabetes   . Pulmonary fibrosis ([place])   . Sleep apnea    mild sleep apnea   no CPAP    Past Surgical History:  Procedure Laterality Date  . apendectomy  1965  . APPENDECTOMY    . bone spur Bilateral 1999   feet  . CARDIAC CATHETERIZATION  2001  . CHOLECYSTECTOMY  2004  . COLON RESECTION N/A 06/15/2018   Procedure: SIGMOID COLON RESECTION;  Surgeon: [name], MD;  Location: [facility];  Service: General;  Laterality: N/A;  . COLONOSCOPY    . COLOSTOMY N/A 06/15/2018   Procedure: COLOSTOMY;  Surgeon: [name], MD;  Location: [facility];  Service: General;  Laterality: N/A;  . COLOSTOMY TAKEDOWN N/A 01/26/2019   Procedure:  LAPAROSCOPIC ASSISTED COLOSTOMY TAKEDOWN;  Surgeon: [name], MD;  Location: [place];  Service: General;  Laterality: N/A;  . EYE SURGERY Bilateral    cataract removal  . LAPAROSCOPIC LYSIS OF ADHESIONS N/A 01/26/2019   Procedure: LAPAROSCOPIC LYSIS OF ADHESIONS;  Surgeon: [name], MD;  Location: [place];  Service: General;  Laterality: N/A;  .  LAPAROTOMY N/A 06/15/2018   Procedure: EXPLORATORY LAPAROTOMY;  Surgeon: [name], MD;  Location: [facility];  Service: General;  Laterality: N/A;  . LUMBAR LAMINECTOMY/ DECOMPRESSION WITH MET-RX Right 05/05/2013   Procedure: Right Lumbar three-four Extraforaminal Microdiskectomy with Metrex;  Surgeon: [name], MD;  Location: [place] [facility];  Service: Neurosurgery;  Laterality: Right;  Right Lumbar three-four Extraforaminal Microdiskectomy with Metrex  . LUNG BIOPSY Right 02/19/2018   Procedure: LUNG BIOPSY;  Surgeon: [name], MD;  Location: [place];  Service: Thoracic;  Laterality: Right;  . SHOULDER OPEN ROTATOR CUFF REPAIR Bilateral 2001  . TONSILLECTOMY    . VIDEO ASSISTED THORACOSCOPY Right 02/19/2018   Procedure: VIDEO ASSISTED THORACOSCOPY;  Surgeon: [name], MD;  Location: [place];  Service: Thoracic;  Laterality: Right;    Allergies  Allergen Reactions  . Lidocaine Hcl Anaphylaxis and Other (See Comments)    Xylocaine  . Xylocaine [Lidocaine] Anaphylaxis  . Codeine Nausea And Vomiting  . Crestor [Rosuvastatin Calcium] Other (See Comments)    All-over body aches Myalgias   . Statins Other (See Comments)    Muscle soreness and an aching feeling all over Myalgias  . Percocet [Oxycodone-Acetaminophen] Itching    Current Outpatient Medications  Medication Sig Dispense Refill  . acetaminophen (TYLENOL) 500 MG tablet Take 1,000 mg by mouth every 6 (six) hours as needed for mild pain or headache.     . albuterol (PROVENTIL HFA;VENTOLIN HFA) 108 (90 Base) MCG/ACT inhaler Inhale 2 puffs into the lungs every 4  (four) hours as needed for wheezing or shortness of breath.     . budesonide-formoterol (SYMBICORT) 80-4.5 MCG/ACT inhaler Inhale 2 puffs into the lungs 2 (two) times daily. (Patient taking differently: Inhale 2 puffs into the lungs daily. ) 1 Inhaler 12  . CARTIA XT 120 MG 24 hr capsule TAKE ONE CAPSULE BY MOUTH DAILY 30 capsule 6  . cetirizine (ZYRTEC) 10 MG tablet Take 10 mg by mouth daily as needed for allergies.     . Cholecalciferol (NATURAL VITAMIN D-3 PO) Take 6,000 Units by mouth daily.    . diphenoxylate-atropine (LOMOTIL) 2.5-0.025 MG tablet Take 1 tablet by mouth 4 (four) times daily as needed for diarrhea or loose stools.    Marland Kitchen ELIQUIS 5 MG TABS tablet TAKE ONE TABLET (5MG TOTAL) BY MOUTH TWODAILY (Patient taking differently: Take 5 mg by mouth 2 (two) times daily. ) 180 tablet 1  . ezetimibe (ZETIA) 10 MG tablet TAKE ONE (1) TABLET BY MOUTH EVERY DAY 90 tablet 3  . fluticasone (FLONASE) 50 MCG/ACT nasal spray Place 2 sprays into both nostrils daily as needed for allergies or rhinitis.    Marland Kitchen gabapentin (NEURONTIN) 300 MG capsule Take 300 mg by mouth 2 (two) times daily.     . methocarbamol (ROBAXIN) 750 MG tablet Take 1 tablet (750 mg total) by mouth 4 (four) times daily as needed (use for muscle cramps/pain). 20 tablet 0  . metoprolol tartrate (LOPRESSOR) 25 MG tablet TAKE ONE TABLET BY MOUTH TWICE A DAY 180 tablet 3  . mycophenolate (CELLCEPT) 500 MG tablet Take 2 tablets by mouth twice a day 120 tablet 3  . OFEV 100 MG CAPS TAKE 1 CAPSULE TWICE A DAY 60 capsule 11  . Omega-3 Fatty Acids (FISH OIL) 500 MG CAPS Take 1 capsule by mouth 2 (two) times daily.    . ondansetron (ZOFRAN) 4 MG tablet Take 4 mg by mouth every 8 (eight) hours as needed for nausea or vomiting.    Marland Kitchen  PREDNISONE PO Take 7 mg by mouth daily.    . Probiotic Product (PROBIOTIC DAILY PO) Take 1 tablet by mouth daily.    Marland Kitchen sulfamethoxazole-trimethoprim (BACTRIM DS) 800-160 MG tablet TAKE ONE TABLET BY MOUTH TWICE A DAY ON  MONDAY, WEDNESDAY, AND FRIDAY 12 tablet 5  . tamsulosin (FLOMAX) 0.4 MG CAPS capsule Take 0.4 mg by mouth 2 (two) times daily.     . Bempedoic Acid-Ezetimibe (NEXLIZET) 180-10 MG TABS Take 1 mg by mouth daily. 90 tablet 3   No current facility-administered medications for this visit.    Social History   Socioeconomic History  . Marital status: Married    Spouse name: Not on file  . Number of children: Not on file  . Years of education: Not on file  . Highest education level: Not on file  Occupational History  . Not on file  Tobacco Use  . Smoking status: Never Smoker  . Smokeless tobacco: Never Used  Substance and Sexual Activity  . Alcohol use: Yes    Alcohol/week: 21.0 standard drinks    Types: 21 Glasses of wine per week    Comment: 2/3 glasses wine in evening  . Drug use: No  . Sexual activity: Yes    Birth control/protection: Other-see comments    Comment: old age  Other Topics Concern  . Not on file  Social History Narrative  . Not on file   Social Determinants of Health   Financial Resource Strain:   . Difficulty of Paying Living Expenses: Not on file  Food Insecurity:   . Worried About Charity fundraiser in the Last Year: Not on file  . Ran Out of Food in the Last Year: Not on file  Transportation Needs:   . Lack of Transportation (Medical): Not on file  . Lack of Transportation (Non-Medical): Not on file  Physical Activity:   . Days of Exercise per Week: Not on file  . Minutes of Exercise per Session: Not on file  Stress:   . Feeling of Stress : Not on file  Social Connections:   . Frequency of Communication with Friends and Family: Not on file  . Frequency of Social Gatherings with Friends and Family: Not on file  . Attends Religious Services: Not on file  . Active Member of Clubs or Organizations: Not on file  . Attends Archivist Meetings: Not on file  . Marital Status: Not on file  Intimate Partner Violence:   . Fear of Current or  Ex-Partner: Not on file  . Emotionally Abused: Not on file  . Physically Abused: Not on file  . 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 He is married has 2 children. There is no tobacco use. He stays active. There is no alcohol use.  He is still working but had a significantly reduced pace than he had previously.  Family history is notable that both parents are deceased.  Mother died of old age.  Father died but had a history of mitral valve prolapse.  He has 2 brothers and one sister who are  alive and well.  ROS General: Negative; No fevers, chills, or night sweats;  HEENT: Negative; No changes in vision or hearing, sinus congestion, difficulty swallowing Pulmonary:  shortness of breath with activity; undergoing evaluation for chronic fibrosing interstitial lung disease Cardiovascular: See history of present illness GI: Status post recent sigmoid colon perforation requiring emergent surgery with subsequent peritonitis and colostomy insertion. GU: Negative;  No dysuria, hematuria, or difficulty voiding Musculoskeletal: Negative; no myalgias, joint pain, or weakness Hematologic/Oncology: Negative; no easy bruising, bleeding Endocrine: Negative; no heat/cold intolerance; no diabetes Neuro: Negative; no changes in balance, headaches Skin: Negative; No rashes or skin lesions Psychiatric: Negative; No behavioral problems, depression Sleep: Positive for OSA O on a home sleep study at [facility] pulmonary negative;  Other comprehensive 14 point system review is negative.  PE BP 124/62 (BP Location: Left Arm, Patient Position: Sitting, Cuff Size: Normal)   Pulse 87   Temp (!) 96.6 F (35.9 C)   Ht '5\' 9"'  (1.753 m)   Wt 186 lb (84.4 kg)   BMI 27.47 kg/m   Blood pressure by me was 130/70.  Pulse was regular at 70  Wt Readings from Last 3 Encounters:  05/26/19 186 lb (84.4 kg)  05/05/19 183 lb (83 kg)  04/16/19 180 lb 6.4 oz (81.8 kg)   General: Alert, oriented, no  distress.  Skin: normal turgor, no rashes, warm and dry HEENT: Normocephalic, atraumatic. Pupils equal round and reactive to light; sclera anicteric; extraocular muscles intact;  Nose without nasal septal hypertrophy Mouth/Parynx benign; Mallinpatti scale 3 Neck: No JVD, no carotid bruits; normal carotid upstroke Lungs: clear to ausculatation and percussion; no wheezing or rales Chest wall: without tenderness to palpitation Heart: PMI not displaced, RRR, s1 s2 normal, 1/6 systolic murmur, no diastolic murmur, no rubs, gallops, thrills, or heaves Abdomen: soft, nontender; no hepatosplenomehaly, BS+; abdominal aorta nontender and not dilated by palpation. Back: no CVA tenderness Pulses 2+ Musculoskeletal: full range of motion, normal strength, no joint deformities Extremities: no clubbing cyanosis or edema, Homan's sign negative  Neurologic: grossly nonfocal; Cranial nerves grossly wnl Psychologic: Normal mood and affect   ECG (independently read by me): Normal sinus rhythm at 87 bpm.  Mild RV conduction delay.  No ectopy.  Low voltage.  Normal intervals  June 2020 ECG (independently read by me): Normal sinus rhythm at 70 bpm.  Incomplete right bundle branch block.  QTc interval normal at 432 ms  06/25/2018 ECG (independently read by me): Normal sinus rhythm at 79 bpm.  QTc interval '4 4 7 ' ms.  05/12/2019 ECG (independently read by me): Normal sinus rhythm at 80 bpm.  No ectopy.  Normal intervals.  September 2018 ECG (independently read by me): Normal sinus rhythm at 65 bpm.  Isolated PAC.  Incomplete right bundle branch block.  Normal intervals.  March 2018 ECG (independently read by me): Normal sinus rhythm at 79 bpm.  QTc interval 460 ms.  PR interval normal at 158 ms.  November 2017 ECG (independently read by me): Normal sinus rhythm at 99 bpm, incomplete right bundle branch block.  Nondiagnostic ST-T changes.  January 2017 ECG (independently read by me): Normal sinus rhythm at 82  bpm.  Mild RV conduction delay.  Normal intervals.  No significant ST-T changes.  December 2015 ECG (independently read by me): Normal sinus rhythm at 74 bpm.  Mild RV conduction delay.  QTc interval 461 ms.  December 2014 ECG: Sinus rhythm at 57 beats per minute. No ectopy. Normal intervals.  LABS:  BMP Latest Ref Rng & Units 01/29/2019 01/27/2019 01/15/2019  Glucose 70 - 99 mg/dL 103(H) 180(H) 203(H)  BUN 8 - 23 mg/dL 5(L) 5(L) 7(L)  Creatinine 0.61 - 1.24 mg/dL 0.66 1.02 0.94  Sodium 135 - 145 mmol/L 140 141 137  Potassium 3.5 - 5.1 mmol/L 3.6 4.7 3.9  Chloride 98 - 111 mmol/L 109 105 102  CO2  22 - 32 mmol/L '27 24 23  ' Calcium 8.9 - 10.3 mg/dL 8.5(L) 8.1(L) 8.8(L)   Hepatic Function Latest Ref Rng & Units 01/15/2019 06/24/2018 06/22/2018  Total Protein 6.5 - 8.1 g/dL 5.9(L) 5.1(L) 5.2(L)  Albumin 3.5 - 5.0 g/dL 3.6 2.4(L) 2.3(L)  AST 15 - 41 U/L 19 14(L) 16  ALT 0 - 44 U/L 13 30 38  Alk Phosphatase 38 - 126 U/L 55 134(H) 156(H)  Total Bilirubin 0.3 - 1.2 mg/dL 1.4(H) 0.7 0.9  Bilirubin, Direct 0.0 - 0.2 mg/dL - 0.1 -   CBC Latest Ref Rng & Units 01/29/2019 01/27/2019 01/15/2019  WBC 4.0 - 10.5 K/uL 11.6(H) 20.0(H) 9.7  Hemoglobin 13.0 - 17.0 g/dL 13.4 14.2 15.2  Hematocrit 39.0 - 52.0 % 37.5(L) 40.0 44.7  Platelets 150 - 400 K/uL 180 187 237   Lab Results  Component Value Date   MCV 105.9 (H) 01/29/2019   MCV 107.0 (H) 01/27/2019   MCV 106.7 (H) 01/15/2019   Erythrocyte Sedimentation Rate     Component Value Date/Time   ESRSEDRATE 13 01/14/2018 0950   CRP November 2017: 139  Repeat CRP 07/16/16:  6  Lab Results  Component Value Date   TSH 2.31 07/16/2016     Lab Results  Component Value Date   HGBA1C 6.9 (H) 02/17/2018     Lipid Panel     Component Value Date/Time   CHOL 222 (H) 07/16/2016 0902   TRIG 359 (H) 07/16/2016 0902   HDL 67 07/16/2016 0902   CHOLHDL 3.3 07/16/2016 0902   VLDL 72 (H) 07/16/2016 0902   LDLCALC 83 07/16/2016 0902    ------------------------------------------------------------------------------------------------------------- Oxygen Saturation: Rest: 92% with pulse of 93  Following walking in the office: 93% with a pulse of 106  03/21/2016 Nuclear Study Highlights    The left ventricular ejection fraction is normal (55-65%).  Nuclear stress EF: 61%.  The study is normal.  This is a low risk study.  There was no ST segment deviation noted during stress.     ------------------------------------------------------------------- 03/22/2016 ECHO Study Conclusions  - Left ventricle: The cavity size was normal. Wall thickness was   normal. Systolic function was normal. The estimated ejection   fraction was in the range of 60% to 65%. Wall motion was normal;   there were no regional wall motion abnormalities. Doppler   parameters are consistent with abnormal left ventricular   relaxation (grade 1 diastolic dysfunction). - Aortic valve: There was no stenosis. - Aorta: Mildly dilated aortic root. Aortic root dimension: 38 mm   (ED). - Mitral valve: There was no significant regurgitation. - Right ventricle: The cavity size was normal. Systolic function   was normal. - Pulmonary arteries: No complete TR doppler jet so unable to   estimate PA systolic pressure. - Inferior vena cava: The vessel was normal in size. The   respirophasic diameter changes were in the normal range (>= 50%),   consistent with normal central venous pressure.  Impressions:  - Normal LV size with EF 60-65%. Normal RV size and systolic   function. No significant valvular abnormalities.  --------------------------------------------------------------------------------------------  04/02/2016 EXAM: CT CHEST WITHOUT CONTRAST  TECHNIQUE: Multidetector CT imaging of the chest was performed following the standard protocol without IV contrast.  COMPARISON:  None.  FINDINGS: Cardiovascular: The heart size is normal.  No pericardial effusion. Atherosclerotic calcification is noted in the wall of the thoracic aorta.  Mediastinum/Nodes: No mediastinal lymphadenopathy. No evidence for gross hilar lymphadenopathy although assessment is limited by the  lack of intravenous contrast on today's study. There is no axillary lymphadenopathy.  Lungs/Pleura: Fine detail obscured by breathing motion. Dependent linear and platelike atelectasis noted in the lower lungs bilaterally. Components of associated parenchymal scarring not excluded. No evidence for pulmonary edema. No dense focal airspace consolidation. No evidence for pleural effusion.  Upper Abdomen: The liver shows diffusely decreased attenuation suggesting steatosis. Gallbladder surgically absent.  Musculoskeletal: Degenerative changes noted in the shoulders bilaterally.  IMPRESSION: Relatively diffuse atelectatic changes in the lungs bilaterally. Study degraded by patient breathing motion.  No specific findings to explain the patient's history of shortness of breath.   06/13/2016 CT Chest High Resolution  FINDINGS: Cardiovascular: Atherosclerotic calcification of the arterial vasculature. Heart size normal. No pericardial effusion.  Mediastinum/Nodes: No pathologically enlarged mediastinal or axillary lymph nodes. Hilar regions are difficult to evaluate without IV contrast. Esophagus is grossly unremarkable.  Lungs/Pleura: Mild basilar predominant subpleural reticulation and ground-glass with minimal architectural distortion. No definite traction bronchiectasis/bronchiolectasis or honeycombing. Findings are likely unchanged from 04/02/2016 but there was a great deal of respiratory motion on that exam. There may be minimal air trapping. No pleural fluid. Airway is unremarkable.  Upper Abdomen: Visualized portions of the liver, adrenal glands and right kidney are grossly unremarkable. Punctate stone in the left kidney. Visualized  portions of the spleen, pancreas, stomach and bowel are grossly unremarkable. Cholecystectomy. No upper abdominal adenopathy.  Musculoskeletal: No worrisome lytic or sclerotic lesions. Degenerative changes are seen in the spine.  IMPRESSION: 1. Mild basilar predominant subpleural reticulation and ground-glass, suggesting nonspecific interstitial pneumonitis. Usual interstitial pneumonitis cannot be excluded on this baseline examination. 2.  Aortic atherosclerosis (ICD10-170.0). 3. Punctate stone left kidney.   IMPRESSION:  1. Essential hypertension   2. CAD in native artery   3. PAF (paroxysmal atrial fibrillation) ([place])   4. Mixed hyperlipidemia   5. OSA    6. ILD (interstitial lung disease) ([place])   7. Anticoagulated   8. History of viral myocarditis: 1989     ASSESSMENT AND PLAN: 1.  CAD: Mild mid systolic LAD bridging noted at catheterization in 2001.  No anginal symptoms.  Tolerated recent emergent bowel surgery without cardiovascular compromise.  Recently had developed atypical chest pain in December leading to evaluation with Coletta Memos, NP.  Currently angina free.  2.  Spontaneous sigmoid colon perforation requiring emergent surgery with subsequent peritonitis and colostomy.  He underwent successful reversal of colostomy with Dr. Marlou Starks tolerated this well from a cardiac standpoint,  3. PAF: Maintaining normal sinus rhythm.  He continues to be on Cardizem CD 120 mg in addition to metoprolol tartrate 25 mg twice a day.  No recurrence.  He is on Eliquis for anticoagulation  4. Eliquis for anticoagulation. He ultimately may also require future neurovascular surgery at which time Eliquis will need to be held for minimum of 72 hours.  5.  Fibrosing nonspecific interstitial pneumonitis followed previously by [name], currently prednisone and Ofev.  He  established care with Dr. Noemi Chapel  6.  History of viral myocarditis, EF 20 to 25% in 1988; most recent echo 2017  EF 60 to 65%.  7.  Mixed hyperlipidemia: He has been on Zetia and was started on omega-3 fatty acid.  Prior lipid studies showed a total cholesterol 260 with LDL 115.  HDL is excellent at 116, triglycerides have improved at 145.  Most recent studies obtained by Dr. Wende Neighbors showed total cholesterol 277, triglycerides 174, LDL 156 and HDL 90 from April 15, 2019.  He cannot tolerate statins.  I have provided him with 1 month of samples of Nexlizet 180 -10 mg, which is combination bempedoic acid and Zetia.  He will discontinue his so low-dose of Zetia.  8.  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  9.  OSA on [place] home sleep study study July 2018.  AHI 27.4 with oxygen desaturation to a nadir of 81%; followed by [facility].   Time spent: 25 minutes Troy Sine, MD, [facility]  05/28/2019 5:59 PM

## 2019-05-26 NOTE — Patient Instructions (Signed)
Medication Instructions:  Start taking Nexlizet daily for a month If you need a refill on your cardiac medications before your next appointment, please call your pharmacy.   Lab work: NONE  Testing/Procedures: NONE  Follow-Up: At [facility], you and your health needs are our priority.  As part of our continuing mission to provide you with exceptional heart care, we have created designated Provider Care Teams.  These Care Teams include your primary Cardiologist (physician) and Advanced Practice Providers (APPs -  Physician Assistants and Nurse Practitioners) who all work together to provide you with the care you need, when you need it. You may see Shelva Majestic, MD or one of the following Advanced Practice Providers on your designated Care Team:    [name], PA-C  Fabian Sharp, [place] or   Roby Lofts, [place]  Your physician wants you to follow-up in: 6 months with Dr. Claiborne Billings

## 2019-05-28 ENCOUNTER — Encounter (HOSPITAL_COMMUNITY): Payer: Self-pay | Admitting: Occupational Therapy

## 2019-05-28 ENCOUNTER — Ambulatory Visit (HOSPITAL_COMMUNITY): Payer: PPO | Admitting: Occupational Therapy

## 2019-05-28 ENCOUNTER — Encounter: Payer: Self-pay | Admitting: Cardiovascular Disease

## 2019-05-28 ENCOUNTER — Other Ambulatory Visit: Payer: Self-pay

## 2019-05-28 DIAGNOSIS — R29898 Other symptoms and signs involving the musculoskeletal system: Secondary | ICD-10-CM

## 2019-05-28 DIAGNOSIS — M25511 Pain in right shoulder: Secondary | ICD-10-CM | POA: Diagnosis not present

## 2019-05-28 DIAGNOSIS — M25512 Pain in left shoulder: Secondary | ICD-10-CM

## 2019-05-28 NOTE — Therapy (Addendum)
[facility] [address] [place], [place], [address] Phone: [phone]   Fax:  [phone]  Occupational Therapy Treatment (LUE assessment and recertification)  Patient Details  Name: [name] MRN: [id number] Date of Birth: 1945-12-19 Referring Provider (OT): Dr. Ophelia Charter   OCCUPATIONAL THERAPY DISCHARGE SUMMARY  Visits from Start of Care: 5  Current functional level related to goals / functional outcomes: Unknown. Pt did not return for any additional visits after last therapy session.    Remaining deficits: Unknown   Education / Equipment: HEP  Plan: Patient agrees to discharge.  Patient goals were partially met. Patient is being discharged due to not returning since the last visit.  ?????       Encounter Date: 05/28/2019  OT End of Session - 05/28/19 1535    Visit Number  5    Number of Visits  9    Date for OT Re-Evaluation  06/18/19    Authorization Type  [employer]; $10 copay    Authorization Time Period  progress note by 10th visit    Authorization - Visit Number  5    Authorization - Number of Visits  10    OT Start Time  1430    OT Stop Time  1510    OT Time Calculation (min)  40 min    Activity Tolerance  Patient tolerated treatment well    Behavior During Therapy  WFL for tasks assessed/performed       Past Medical History:  Diagnosis Date  . Allergy to alpha-gal    2015; has been able to re-introduce red meat for the past 2 1/2 years without reaction (as of 01/21/19)  . Arthritis   . Chest pain    a. 03/2017: echo showing EF of 60-65%, no regional WMA or significant valve abnormalities. b. 03/2016: NST with no evidence of ischemia.   . Complication of anesthesia    "anaphylactic" reaction after given xylocaine for shoulder injection > 10 years ago; reported no known reaction when given marcaine (as of 01/21/19)   . Digestive problems    on Prednisone prn for this issue- diarrhea  .  Dyspnea    with activity  . Dysrhythmia    irregular due to Myocarditis- takes Atenolol, Norvasc  . GERD (gastroesophageal reflux disease)   . H/O viral myocarditis    25 years ago  . Head injury, closed, with concussion    [place] LOC  . Hyperlipidemia   . Mild mitral valve prolapse    per Dr Evette Georges notes  . PAF (paroxysmal atrial fibrillation) ([place])    a. initially occuring in 02/2016. b. recurrent in 07/2016. Placed on Eliquis  . Polymyalgia rheumatica ([place])   . Pre-diabetes   . Pulmonary fibrosis ([place])   . Sleep apnea    mild sleep apnea   no CPAP    Past Surgical History:  Procedure Laterality Date  . apendectomy  1965  . APPENDECTOMY    . bone spur Bilateral 1999   feet  . CARDIAC CATHETERIZATION  2001  . CHOLECYSTECTOMY  2004  . COLON RESECTION N/A 06/15/2018   Procedure: SIGMOID COLON RESECTION;  Surgeon: [name], MD;  Location: [facility];  Service: General;  Laterality: N/A;  . COLONOSCOPY    . COLOSTOMY N/A 06/15/2018   Procedure: COLOSTOMY;  Surgeon: [name], MD;  Location: [facility];  Service: General;  Laterality: N/A;  . COLOSTOMY TAKEDOWN N/A 01/26/2019   Procedure: LAPAROSCOPIC ASSISTED  COLOSTOMY TAKEDOWN;  Surgeon: [name], MD;  Location: [facility];  Service: General;  Laterality: N/A;  . EYE SURGERY Bilateral    cataract removal  . LAPAROSCOPIC LYSIS OF ADHESIONS N/A 01/26/2019   Procedure: LAPAROSCOPIC LYSIS OF ADHESIONS;  Surgeon: [name], MD;  Location: [facility];  Service: General;  Laterality: N/A;  . LAPAROTOMY N/A 06/15/2018   Procedure: EXPLORATORY LAPAROTOMY;  Surgeon: [name], MD;  Location: [facility];  Service: General;  Laterality: N/A;  . LUMBAR LAMINECTOMY/ DECOMPRESSION WITH MET-RX Right 05/05/2013   Procedure: Right Lumbar three-four Extraforaminal Microdiskectomy with Metrex;  Surgeon: [name], MD;  Location: [facility];  Service: Neurosurgery;  Laterality: Right;  Right Lumbar three-four Extraforaminal Microdiskectomy with  Metrex  . LUNG BIOPSY Right 02/19/2018   Procedure: LUNG BIOPSY;  Surgeon: [name], MD;  Location: [facility];  Service: Thoracic;  Laterality: Right;  . SHOULDER OPEN ROTATOR CUFF REPAIR Bilateral 2001  . TONSILLECTOMY    . VIDEO ASSISTED THORACOSCOPY Right 02/19/2018   Procedure: VIDEO ASSISTED THORACOSCOPY;  Surgeon: [name], MD;  Location: [facility];  Service: Thoracic;  Laterality: Right;    There were no vitals filed for this visit.  Subjective Assessment - 05/28/19 1433    Subjective   S: This arthritis makes my arms sore.    Currently in Pain?  No/denies         [facility] OT Assessment - 05/28/19 1432      Assessment   Medical Diagnosis  Right RC tendonitis; left shoulder pain    Referring Provider (OT)  Dr. Ophelia Charter      Precautions   Precautions  None      AROM   Overall AROM Comments  Assessed seated, er/IR adducted    AROM Assessment Site  Shoulder    Right/Left Shoulder  Left    Left Shoulder Flexion  156 Degrees    Left Shoulder ABduction  121 Degrees    Left Shoulder Internal Rotation  90 Degrees    Left Shoulder External Rotation  46 Degrees      PROM   PROM Assessment Site  Shoulder    Right/Left Shoulder  Left    Left Shoulder Flexion  163 Degrees    Left Shoulder ABduction  180 Degrees    Left Shoulder Internal Rotation  90 Degrees    Left Shoulder External Rotation  72 Degrees      Strength   Overall Strength Comments  Assessed seated, er/IR adducted    Strength Assessment Site  Shoulder    Right/Left Shoulder  Left    Left Shoulder Flexion  5/5    Left Shoulder ABduction  4/5    Left Shoulder Internal Rotation  5/5    Left Shoulder External Rotation  4/5               OT Treatments/Exercises (OP) - 05/28/19 1446      Exercises   Exercises  Shoulder      Shoulder Exercises: Supine   Protraction  PROM;5 reps;AROM;15 reps;Both    Horizontal ABduction  PROM;5 reps;AROM;15 reps;Both    External Rotation  PROM;5  reps;AROM;15 reps;Both    Internal Rotation  PROM;5 reps;AROM;15 reps;Both    Flexion  PROM;5 reps;AROM;15 reps;Both    ABduction  PROM;5 reps;AROM;15 reps;Both      Shoulder Exercises: Standing   Extension  Theraband;12 reps    Theraband Level (Shoulder Extension)  Level 2 (Red)    Row  Theraband;12 reps    Theraband Level (Shoulder Row)  Level 2 (Red)    Retraction  Theraband;12 reps    Theraband Level (Shoulder Retraction)  Level 2 (Red)      Shoulder Exercises: Therapy Ball   Other Therapy Ball Exercises  green therapy ball: chest press, flexion, circles each direction, 10X each      Shoulder Exercises: ROM/Strengthening   X to V Arms  10X      Manual Therapy   Manual Therapy  Myofascial release    Manual therapy comments  completed separately from therapeutic exercises    Myofascial Release  myofascial release and manual therapy to bilateral  anterior shoulder, trapezius, and scapular regions to decrease pain and fascial restrictions and increase joint ROM             OT Education - 05/28/19 1500    Education Details  reissued A/ROM handout    Person(s) Educated  Patient    Methods  Explanation;Demonstration;Handout    Comprehension  Verbalized understanding;Returned demonstration       OT Short Term Goals - 05/28/19 1539      OT SHORT TERM GOAL #1   Title  Pt will be provided with and educated on HEP for improved mobility required for ADL completion.    Time  4    Period  Weeks    Status  On-going    Target Date  06/18/19      OT SHORT TERM GOAL #2   Title  Pt will decrease BUE fascial restrictions to minimal amounts or less to improve mobilty required for reaching tasks.    Baseline  revised to include LUE    Time  4    Period  Weeks    Status  Revised      OT SHORT TERM GOAL #3   Title  Pt will increase BUE strength to 5/5 to increase ability to lift and carry objects, shoot a shotgun, and fly fish without fatigue or pain.    Baseline  revised to  include LUE    Time  4    Period  Weeks    Status  Revised      OT SHORT TERM GOAL #4   Title  Pt will decrease pain in BUE to 2/10 or less to improve ability to sleep for 4 consecutive hours or greater.    Baseline  Revised to include LUE    Time  4    Period  Weeks    Status  On-going               Plan - 05/28/19 1536    Clinical Impression Statement  A: Received order to evaluate and treat left shoulder pain, LUE assessment completed and goals revised to reflect. Completed manual therapy for BUE, pt with LUE P/ROM WNL after manual techniques. Completed A/ROM for BUE, green therapy ball strengthening exercises, and x to v arms. Verbal cuing for form and technique.    Body Structure / Function / Physical Skills  ADL;UE functional use;Fascial restriction;Pain;IADL;Strength    Plan  P: Continue working on BUE pain, Add red loop band and w arms for BUE. complete exercises using 1# weight in supine       Patient will benefit from skilled therapeutic intervention in order to improve the following deficits and impairments:   Body Structure / Function / Physical Skills: ADL, UE functional use, Fascial restriction, Pain, IADL, Strength       Visit Diagnosis: Acute  pain of right shoulder  Other symptoms and signs involving the musculoskeletal system  Other dysphagia    Problem List Patient Active Problem List   Diagnosis Date Noted  . Colostomy in place [facility]) 01/26/2019  . Prolonged Q-T interval on ECG 06/25/2018  . 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 Sigmoid colon perforation s/p [place]mann colectomy/colostomy 06/15/2018 06/15/2018  . Perforated diverticulum of large intestine 06/15/2018  . OSA (obstructive sleep apnea) 12/13/2016  . Snoring 10/26/2016  . PAF (paroxysmal atrial fibrillation) ([place]) 08/14/2016  . ILD (interstitial lung disease) ([place]) 05/01/2016  . Lumbar stenosis 02/01/2015  . Giardia 04/14/2014  . H/O viral cardiomyopathy  04/14/2014  . Herniated nucleus pulposus, L3-4 right 05/05/2013  . Other hypertrophic cardiomyopathy ([place]) 04/19/2013  . Hyperlipidemia 04/19/2013  . Essential hypertension 04/19/2013  . Abnormal LFTs 09/16/2012  . Nausea 07/10/2011  . Chronic diarrhea 07/10/2011  . Burping 07/10/2011  . Osteoporosis 07/10/2011  . Arthritis 07/10/2011   Guadelupe Sabin, OTR/L  [phone] 05/28/2019, 3:43 PM  [facility] [address] [place], [place], [address] Phone: [phone]   Fax:  [phone]  Name: [name] MRN: [id number] Date of Birth: Sep 21, 1945

## 2019-05-28 NOTE — Patient Instructions (Signed)

## 2019-06-01 ENCOUNTER — Telehealth (HOSPITAL_COMMUNITY): Payer: Self-pay | Admitting: Occupational Therapy

## 2019-06-01 NOTE — Telephone Encounter (Signed)
pt called to cancel his appt for this day due to he is going to get the covid vaccine

## 2019-06-03 DIAGNOSIS — M5126 Other intervertebral disc displacement, lumbar region: Secondary | ICD-10-CM | POA: Diagnosis not present

## 2019-06-03 DIAGNOSIS — S32010D Wedge compression fracture of first lumbar vertebra, subsequent encounter for fracture with routine healing: Secondary | ICD-10-CM | POA: Diagnosis not present

## 2019-06-04 ENCOUNTER — Telehealth: Payer: Self-pay | Admitting: *Deleted

## 2019-06-04 ENCOUNTER — Encounter (HOSPITAL_COMMUNITY): Payer: PPO | Admitting: Occupational Therapy

## 2019-06-04 NOTE — Telephone Encounter (Signed)
Patient with diagnosis of afib on Eliquis for anticoagulation.    Procedure:  L1-2 Laminectomy/microdiscectomy Date of procedure: TBD  CHADS2-VASc score of  3 (HTN, AGE, CAD)  CrCl 102 ml/min  Per office protocol, patient can hold Eliquis for 3 days prior to procedure.

## 2019-06-04 NOTE — Telephone Encounter (Signed)
   Primary Cardiologist: Shelva Majestic, MD  Chart reviewed as part of pre-operative protocol coverage. Patient was contacted 06/04/2019 in reference to pre-operative risk assessment for pending surgery as outlined below.  [name] was last seen on 05/26/2019 by Dr. Claiborne Billings.  Since that day, [name] has done well with no chest pain, shortness of breath or new cardiac complaints.  Therefore, based on ACC/AHA guidelines, the patient would be at acceptable risk for the planned procedure without further cardiovascular testing.   Per office protocol, patient can hold Eliquis for 3 days prior to procedure.    I will route this recommendation to the requesting party via Epic fax function and remove from pre-op pool.  Please call with questions.  Daune Perch, NP 06/04/2019, 2:06 PM

## 2019-06-04 NOTE — Telephone Encounter (Signed)
   [facility] Pre-operative Risk Assessment    Request for surgical clearance:  1. What type of surgery is being performed? L1-2 Laminectomy/microdiscectomy  2. When is this surgery scheduled? TBD   3. What type of clearance is required (medical clearance vs. Pharmacy clearance to hold med vs. Both)? both  4. Are there any medications that need to be held prior to surgery and how long? Eliquis   5. Practice name and name of physician performing surgery? [facility]  6. What is your office phone number [phone]    7.   What is your office fax number [phone]  8.   Anesthesia type (None, local, MAC, general) ? General   Porshea Janowski A Heath Tesler 06/04/2019, 12:08 PM  _________________________________________________________________

## 2019-06-08 DIAGNOSIS — I4891 Unspecified atrial fibrillation: Secondary | ICD-10-CM | POA: Diagnosis not present

## 2019-06-08 DIAGNOSIS — J8410 Pulmonary fibrosis, unspecified: Secondary | ICD-10-CM | POA: Diagnosis not present

## 2019-06-08 DIAGNOSIS — E1169 Type 2 diabetes mellitus with other specified complication: Secondary | ICD-10-CM | POA: Diagnosis not present

## 2019-06-10 DIAGNOSIS — Z01818 Encounter for other preprocedural examination: Secondary | ICD-10-CM | POA: Diagnosis not present

## 2019-06-11 ENCOUNTER — Ambulatory Visit (HOSPITAL_COMMUNITY): Payer: PPO | Admitting: Occupational Therapy

## 2019-06-11 ENCOUNTER — Telehealth (HOSPITAL_COMMUNITY): Payer: Self-pay | Admitting: Occupational Therapy

## 2019-06-11 NOTE — Telephone Encounter (Signed)
Pt requested to be d/c today he's  quarantining until he has back surgery next week.

## 2019-06-15 DIAGNOSIS — S32010D Wedge compression fracture of first lumbar vertebra, subsequent encounter for fracture with routine healing: Secondary | ICD-10-CM | POA: Diagnosis not present

## 2019-06-15 DIAGNOSIS — M25511 Pain in right shoulder: Secondary | ICD-10-CM | POA: Diagnosis not present

## 2019-06-15 DIAGNOSIS — M542 Cervicalgia: Secondary | ICD-10-CM | POA: Diagnosis not present

## 2019-06-15 DIAGNOSIS — Z13820 Encounter for screening for osteoporosis: Secondary | ICD-10-CM | POA: Diagnosis not present

## 2019-06-15 DIAGNOSIS — R768 Other specified abnormal immunological findings in serum: Secondary | ICD-10-CM | POA: Diagnosis not present

## 2019-06-15 DIAGNOSIS — M353 Polymyalgia rheumatica: Secondary | ICD-10-CM | POA: Diagnosis not present

## 2019-06-15 DIAGNOSIS — Z7952 Long term (current) use of systemic steroids: Secondary | ICD-10-CM | POA: Diagnosis not present

## 2019-06-15 DIAGNOSIS — J84112 Idiopathic pulmonary fibrosis: Secondary | ICD-10-CM | POA: Diagnosis not present

## 2019-06-16 DIAGNOSIS — Z981 Arthrodesis status: Secondary | ICD-10-CM | POA: Diagnosis not present

## 2019-06-16 DIAGNOSIS — M5126 Other intervertebral disc displacement, lumbar region: Secondary | ICD-10-CM | POA: Diagnosis not present

## 2019-06-16 DIAGNOSIS — M5116 Intervertebral disc disorders with radiculopathy, lumbar region: Secondary | ICD-10-CM | POA: Diagnosis not present

## 2019-06-18 ENCOUNTER — Encounter (HOSPITAL_COMMUNITY): Payer: PPO | Admitting: Occupational Therapy

## 2019-06-23 DIAGNOSIS — L570 Actinic keratosis: Secondary | ICD-10-CM | POA: Diagnosis not present

## 2019-06-23 DIAGNOSIS — B078 Other viral warts: Secondary | ICD-10-CM | POA: Diagnosis not present

## 2019-06-23 DIAGNOSIS — C44319 Basal cell carcinoma of skin of other parts of face: Secondary | ICD-10-CM | POA: Diagnosis not present

## 2019-06-23 DIAGNOSIS — Z1283 Encounter for screening for malignant neoplasm of skin: Secondary | ICD-10-CM | POA: Diagnosis not present

## 2019-06-23 DIAGNOSIS — X32XXXD Exposure to sunlight, subsequent encounter: Secondary | ICD-10-CM | POA: Diagnosis not present

## 2019-06-23 DIAGNOSIS — D225 Melanocytic nevi of trunk: Secondary | ICD-10-CM | POA: Diagnosis not present

## 2019-06-24 DIAGNOSIS — M353 Polymyalgia rheumatica: Secondary | ICD-10-CM | POA: Diagnosis not present

## 2019-06-26 ENCOUNTER — Ambulatory Visit (HOSPITAL_COMMUNITY): Admission: RE | Admit: 2019-06-26 | Payer: PPO | Source: Ambulatory Visit

## 2019-07-01 ENCOUNTER — Telehealth: Payer: Self-pay | Admitting: Pharmacy Technician

## 2019-07-01 NOTE — Telephone Encounter (Signed)
Office received letter from [facility] that patient's Ofev 100mg  Cap is either non-formulary or is subject to certain limits. Office tried to initiate PA, but received response that one was not required. Patient has been receiving refills through [facility].  Will call plan to see if PA is needed and initiate if necessary.  Phone# [phone] ID# [id number]  12:33 PM Beatriz Chancellor, CPhT

## 2019-07-02 NOTE — Telephone Encounter (Signed)
Called [employer], rep John advised that no prior authorization for Ofev is needed at this time. Says plan states patient can fill 60 capsules for 30days. Any higher quantity could require  new prior authorization.  Phone# [phone]  9:42 AM Beatriz Chancellor, CPhT

## 2019-07-10 DIAGNOSIS — S32010A Wedge compression fracture of first lumbar vertebra, initial encounter for closed fracture: Secondary | ICD-10-CM | POA: Diagnosis not present

## 2019-07-13 ENCOUNTER — Other Ambulatory Visit: Payer: Self-pay

## 2019-07-13 ENCOUNTER — Other Ambulatory Visit (HOSPITAL_COMMUNITY)
Admission: RE | Admit: 2019-07-13 | Discharge: 2019-07-13 | Disposition: A | Discharge status: Home / Self Care | Payer: PPO | Source: Ambulatory Visit | Attending: Critical Care Medicine | Admitting: Critical Care Medicine

## 2019-07-13 DIAGNOSIS — Z20822 Contact with and (suspected) exposure to covid-19: Secondary | ICD-10-CM | POA: Diagnosis not present

## 2019-07-13 DIAGNOSIS — Z01812 Encounter for preprocedural laboratory examination: Secondary | ICD-10-CM | POA: Insufficient documentation

## 2019-07-13 LAB — SARS CORONAVIRUS 2 (TAT 6-24 HRS): SARS Coronavirus 2: NEGATIVE

## 2019-07-15 ENCOUNTER — Other Ambulatory Visit: Payer: Self-pay

## 2019-07-15 ENCOUNTER — Ambulatory Visit (INDEPENDENT_AMBULATORY_CARE_PROVIDER_SITE_OTHER): Payer: PPO | Admitting: Critical Care Medicine

## 2019-07-15 DIAGNOSIS — J849 Interstitial pulmonary disease, unspecified: Secondary | ICD-10-CM

## 2019-07-15 LAB — PULMONARY FUNCTION TEST
DL/VA % pred: 90 %
DL/VA: 3.68 ml/min/mmHg/L
DLCO cor % pred: 80 %
DLCO cor: 19.51 ml/min/mmHg
DLCO unc % pred: 80 %
DLCO unc: 19.51 ml/min/mmHg
FEF 25-75 Post: 2.55 L/sec
FEF 25-75 Pre: 2.08 L/sec
FEF2575-%Change-Post: 22 %
FEF2575-%Pred-Post: 113 %
FEF2575-%Pred-Pre: 92 %
FEV1-%Change-Post: 4 %
FEV1-%Pred-Post: 96 %
FEV1-%Pred-Pre: 91 %
FEV1-Post: 2.85 L
FEV1-Pre: 2.72 L
FEV1FVC-%Change-Post: 4 %
FEV1FVC-%Pred-Pre: 103 %
FEV6-%Change-Post: 0 %
FEV6-%Pred-Post: 94 %
FEV6-%Pred-Pre: 93 %
FEV6-Post: 3.6 L
FEV6-Pre: 3.58 L
FEV6FVC-%Change-Post: 0 %
FEV6FVC-%Pred-Post: 106 %
FEV6FVC-%Pred-Pre: 106 %
FVC-%Change-Post: 0 %
FVC-%Pred-Post: 88 %
FVC-%Pred-Pre: 88 %
FVC-Post: 3.6 L
FVC-Pre: 3.58 L
Post FEV1/FVC ratio: 79 %
Post FEV6/FVC ratio: 100 %
Pre FEV1/FVC ratio: 76 %
Pre FEV6/FVC Ratio: 100 %
RV % pred: 94 %
RV: 2.24 L
TLC % pred: 88 %
TLC: 5.86 L

## 2019-07-15 NOTE — Progress Notes (Signed)
PFT done today. 

## 2019-07-16 ENCOUNTER — Ambulatory Visit
Admission: RE | Admit: 2019-07-16 | Discharge: 2019-07-16 | Disposition: A | Discharge status: Home / Self Care | Payer: PPO | Source: Ambulatory Visit | Attending: Physician Assistant | Admitting: Physician Assistant

## 2019-07-16 DIAGNOSIS — Z13820 Encounter for screening for osteoporosis: Secondary | ICD-10-CM

## 2019-07-16 DIAGNOSIS — M85852 Other specified disorders of bone density and structure, left thigh: Secondary | ICD-10-CM | POA: Diagnosis not present

## 2019-07-21 ENCOUNTER — Other Ambulatory Visit: Payer: Self-pay

## 2019-07-21 ENCOUNTER — Ambulatory Visit: Payer: PPO | Admitting: Critical Care Medicine

## 2019-07-21 ENCOUNTER — Encounter: Payer: Self-pay | Admitting: Critical Care Medicine

## 2019-07-21 ENCOUNTER — Telehealth: Payer: Self-pay | Admitting: Critical Care Medicine

## 2019-07-21 VITALS — BP 130/78 | HR 90 | Temp 97.1°F | Ht 68.0 in | Wt 188.6 lb

## 2019-07-21 DIAGNOSIS — J849 Interstitial pulmonary disease, unspecified: Secondary | ICD-10-CM

## 2019-07-21 NOTE — Patient Instructions (Addendum)
Thank you for visiting Dr. Carlis Abbott at [facility]. We recommend the following: Orders Placed This Encounter  Procedures  . CT Chest High Resolution  . COMPLETE METABOLIC PANEL WITH GFR   Orders Placed This Encounter  Procedures  . CT Chest High Resolution    First available    Standing Status:   Future    Standing Expiration Date:   09/19/2020    Order Specific Question:   ** REASON FOR EXAM (FREE TEXT)    Answer:   progressive symptoms    Order Specific Question:   Preferred imaging location?    Answer:   [facility]    Order Specific Question:   Radiology Contrast Protocol - do NOT remove file path    Answer:   [email or web address]  . COMPLETE METABOLIC PANEL WITH GFR    Standing Status:   Future    Standing Expiration Date:   07/20/2020    No orders of the defined types were placed in this encounter.   Return in about 4 months (around 11/20/2019).    Please do your part to reduce the spread of COVID-19.

## 2019-07-21 NOTE — Progress Notes (Signed)
Synopsis: Referred in November 2017 for ILD by [name], MD.  Previously patient of [name].  Subjective:   [name] GENDER: male DOB: 1945-11-11, MRN: [id number]  Chief Complaint  Patient presents with  . Follow-up    Patient is still having shortness of breath with exertion but feels like it has got a lilttle worse since last visit. Walking from room to room, walking up hill, bending over makes it worse.     [name] is a 74 year old gentleman with a history of ILD- UIP vs NSIP on chronic Ofev, mycophenolate who presents for follow-up.  He required disruption of these medications for several months in late 2020 his colostomy reversal.  When the meds were restarted postoperatively he had persistent bleeding, leading to prolonged time off medications and restarting them sequentially.  He feels that his breathing has gotten worse over time since then, but is unsure if that was all due to being off the meds or if it is continued to decline since then.  His breathing is usually worse during warmer weather.  He has noticed that his activities are more limited than they used to be.  He recently had an outpatient back surgery where he had to have bone fragments removed that were pinching and nerves.  He was developing left lower extremity weakness preoperatively.  Due to ongoing nerve impingement that is presumed to be due to inflammation, he is currently on a prednisone taper.  Currently on 20 mg daily and will decrease to 15 mg daily tomorrow.  He is on chronic prednisone for polymyalgia rheumatica at 7 mg daily.  He has been on Ofev 100 mg twice daily.  He was initially started on 150 mg twice daily, but required decrease in his dose due to nausea, vomiting, diarrhea.  Over time the symptoms have improved.  He restarted his meds postoperatively in the fall 2020 he had symptoms short-term, but noticed improvement over time.  When he had GI  symptoms at the higher dose, he had some improvement with Zofran and Phenergan.  He is currently not having blood in his stools or other issues related to surgery.  He has recently received his 2 doses of the vagina vaccine.  He and his wife are planning a cross-country trip with their RV to visit children and grandchildren later this spring where they will be gone for 2 months.  Reviewed rheumatology, Dr. Melissa Noon clinic note 06/15/2019.  Considering starting Plaquenil to reduce dose of prednisone.       OV 04/16/2019: [name] is a 74 year old gentleman who presents for follow-up of NSIP.  He has had worse symptoms since interrupting his CellCept and nintedanib for his colectomy reversal.  He had bleeding when he initially restarted these medicines a few weeks postop, which required stopping them again.  He restarted his nintedanib last week and CellCept with Bactrim about 2 weeks prior.  He has not had any additional bleeding.  No abdominal pain.  He feels better than he did before surgery.  He still has mild dyspnea, not worse than his last visit.  He notices it most when he is leaning over.  No other complaints.  His PCP did his 68-monthlab work earlier this week, and he will call the office to request they send [place].   OV 03/19/2019: Mr. RHarkinsis a 74year old gentleman with history of ILD to NSIP and polymyalgia rheumatica on chronic immunosuppression who presents for follow-up.  He underwent  colostomy reversal on 01/26/2019, for which his CellCept plus Bactrim and nintedanib were held due to concern for potential surgical complications.  Several weeks following surgery his medications were restarted, and within a day or 2 he developed rectal bleeding.  This continued for several days until he notified us, and we instructed him to stop.  Within the next day or 2 the bleeding has stopped.  He notices that his breathing has started to get a little bit worse over the last few weeks, and he  would like to restart his medications.  He feels like he cannot exert himself due to his mild dyspnea.  He denies abdominal pain, fevers, nausea, vomiting, diarrhea, constipation, or periodic rectal bleeding.  He is following up with Dr. Marlou Starks surgery tomorrow.  He had had a colonoscopy just prior to surgery, but is unsure if he had hemorrhoids.  He follows with [name] in rheumatology.  His prednisone has been tapered down to 7 mg, which will be his dose for now.  There has been mention of possibly starting Plaquenil in the future for his polymyalgia rheumatica.  OV 01/21/19: [name] is a 74 year old gentleman with a history of fibrosing NSIP diagnosed on open lung biopsy in 2019 who presents for routine follow-up and preoperative evaluation before colostomy reversal next week.  He has a history of polymyalgia rheumatica for which she has been on chronic steroids.  He is followed by [name] from rheumatology.  Currently he is being titrated off his chronic prednisone, currently on 8 mg daily.  For his NSIP he is maintained on CellCept, nintedanib, and Bactrim 3 days a week.  He is doing well, better than last year from breathlessness standpoint.  He went hiking with his wife a few weeks ago, which he reports he would have been unable to do a year ago.  He has nausea and diarrhea associated with nintedanib, but he is able to tolerate these with the use of Zofran and Imodium.  No jaundice, itching, right upper quadrant pain.  In February 2020 he underwent emergency partial colectomy with diverting colostomy for perforated diverticulitis.  He is planning to undergo colostomy reversal next week.  In July he fell, causing an L1 compression fracture.  [name] is planning to do a bone mineral density study, and currently he is not on calcium and vitamin D supplements.  In a few months he is planning on having a small outpatient back surgery to address a bone spur that is causing radicular pain in his left  leg.  Today he wants to discuss plans for his pulmonary meds perioperatively.  He has previously had symptomatic renal insufficiency when he tried to come off prednisone on his own too quickly.  During his VATS and colectomy he received 10 mg of dexamethasone in the OR, and he received low-dose Solu-Medrol postoperatively from his colectomy.  Flowsheet data:  Lab Results  Component Value Date   NITRICOXIDE 8 03/03/2018    Past Medical History:  Diagnosis Date  . Allergy to alpha-gal    2015; has been able to re-introduce red meat for the past 2 1/2 years without reaction (as of 01/21/19)  . Arthritis   . Chest pain    a. 03/2017: echo showing EF of 60-65%, no regional WMA or significant valve abnormalities. b. 03/2016: NST with no evidence of ischemia.   . Complication of anesthesia    "anaphylactic" reaction after given xylocaine for shoulder injection > 10 years ago; reported no known  reaction when given marcaine (as of 01/21/19)   . Digestive problems    on Prednisone prn for this issue- diarrhea  . Dyspnea    with activity  . Dysrhythmia    irregular due to Myocarditis- takes Atenolol, Norvasc  . GERD (gastroesophageal reflux disease)   . H/O viral myocarditis    25 years ago  . Head injury, closed, with concussion    Breif LOC  . Hyperlipidemia   . Mild mitral valve prolapse    per Dr Evette Georges notes  . PAF (paroxysmal atrial fibrillation) ([place])    a. initially occuring in 02/2016. b. recurrent in 07/2016. Placed on Eliquis  . Polymyalgia rheumatica ([place])   . Pre-diabetes   . Pulmonary fibrosis ([place])   . Sleep apnea    mild sleep apnea   no CPAP     Family History  Problem Relation Age of Onset  . Rheum arthritis Mother   . Thyroid cancer Mother   . Heart disease Father   . Asthma Father   . Thyroid cancer Sister   . Melanoma Brother      Past Surgical History:  Procedure Laterality Date  . apendectomy  1965  . APPENDECTOMY    . bone spur Bilateral 1999   feet    . CARDIAC CATHETERIZATION  2001  . CHOLECYSTECTOMY  2004  . COLON RESECTION N/A 06/15/2018   Procedure: SIGMOID COLON RESECTION;  Surgeon: [name], MD;  Location: [facility];  Service: General;  Laterality: N/A;  . COLONOSCOPY    . COLOSTOMY N/A 06/15/2018   Procedure: COLOSTOMY;  Surgeon: [name], MD;  Location: [facility];  Service: General;  Laterality: N/A;  . COLOSTOMY TAKEDOWN N/A 01/26/2019   Procedure: LAPAROSCOPIC ASSISTED COLOSTOMY TAKEDOWN;  Surgeon: [name], MD;  Location: [facility];  Service: General;  Laterality: N/A;  . EYE SURGERY Bilateral    cataract removal  . LAPAROSCOPIC LYSIS OF ADHESIONS N/A 01/26/2019   Procedure: LAPAROSCOPIC LYSIS OF ADHESIONS;  Surgeon: [name], MD;  Location: [facility];  Service: General;  Laterality: N/A;  . LAPAROTOMY N/A 06/15/2018   Procedure: EXPLORATORY LAPAROTOMY;  Surgeon: [name], MD;  Location: [facility];  Service: General;  Laterality: N/A;  . LUMBAR LAMINECTOMY/ DECOMPRESSION WITH MET-RX Right 05/05/2013   Procedure: Right Lumbar three-four Extraforaminal Microdiskectomy with Metrex;  Surgeon: [name], MD;  Location: [facility];  Service: Neurosurgery;  Laterality: Right;  Right Lumbar three-four Extraforaminal Microdiskectomy with Metrex  . LUNG BIOPSY Right 02/19/2018   Procedure: LUNG BIOPSY;  Surgeon: [name], MD;  Location: [facility];  Service: Thoracic;  Laterality: Right;  . SHOULDER OPEN ROTATOR CUFF REPAIR Bilateral 2001  . TONSILLECTOMY    . VIDEO ASSISTED THORACOSCOPY Right 02/19/2018   Procedure: VIDEO ASSISTED THORACOSCOPY;  Surgeon: [name], MD;  Location[facility];  Service: Thoracic;  Laterality: Right;    Social History   Socioeconomic History  . Marital status: Married    Spouse name: Not on file  . Number of children: Not on file  . Years of education: Not on file  . Highest education level: Not on file  Occupational History  . Not on file  Tobacco Use  . Smoking status:  Never Smoker  . Smokeless tobacco: Never Used  Substance and Sexual Activity  . Alcohol use: Yes    Alcohol/week: 21.0 standard drinks    Types: 21 Glasses of wine per week    Comment: 2/3  glasses wine in evening  . Drug use: No  . Sexual activity: Yes    Birth control/protection: Other-see comments    Comment: old age  Other Topics Concern  . Not on file  Social History Narrative  . Not on file   Social Determinants of Health   Financial Resource Strain:   . Difficulty of Paying Living Expenses: Not on file  Food Insecurity:   . Worried About Charity fundraiser in the Last Year: Not on file  . Ran Out of Food in the Last Year: Not on file  Transportation Needs:   . Lack of Transportation (Medical): Not on file  . Lack of Transportation (Non-Medical): Not on file  Physical Activity:   . Days of Exercise per Week: Not on file  . Minutes of Exercise per Session: Not on file  Stress:   . Feeling of Stress : Not on file  Social Connections:   . Frequency of Communication with Friends and Family: Not on file  . Frequency of Social Gatherings with Friends and Family: Not on file  . Attends Religious Services: Not on file  . Active Member of Clubs or Organizations: Not on file  . Attends Archivist Meetings: Not on file  . Marital Status: Not on file  Intimate Partner Violence:   . Fear of Current or Ex-Partner: Not on file  . Emotionally Abused: Not on file  . Physically Abused: Not on file  . Sexually Abused: Not on file     Allergies  Allergen Reactions  . Lidocaine Hcl Anaphylaxis and Other (See Comments)    Xylocaine  . Xylocaine [Lidocaine] Anaphylaxis  . Codeine Nausea And Vomiting  . Crestor [Rosuvastatin Calcium] Other (See Comments)    All-over body aches Myalgias   . Statins Other (See Comments)    Muscle soreness and an aching feeling all over Myalgias  . Percocet [Oxycodone-Acetaminophen] Itching     Immunization History  Administered  Date(s) Administered  . Influenza Split 03/20/2016  . Influenza, High Dose Seasonal PF 01/29/2018, 02/25/2019, 03/20/2019  . Influenza,inj,Quad PF,6+ Mos 03/12/2017  . Moderna SARS-COVID-2 Vaccination 06/04/2019, 07/10/2019  . Pneumococcal Conjugate-13 01/10/2018    Outpatient Medications Prior to Visit  Medication Sig Dispense Refill  . acetaminophen (TYLENOL) 500 MG tablet Take 1,000 mg by mouth every 6 (six) hours as needed for mild pain or headache.     . albuterol (PROVENTIL HFA;VENTOLIN HFA) 108 (90 Base) MCG/ACT inhaler Inhale 2 puffs into the lungs every 4 (four) hours as needed for wheezing or shortness of breath.     . Bempedoic Acid-Ezetimibe (NEXLIZET) 180-10 MG TABS Take 1 mg by mouth daily. 90 tablet 3  . budesonide-formoterol (SYMBICORT) 80-4.5 MCG/ACT inhaler Inhale 2 puffs into the lungs 2 (two) times daily. (Patient taking differently: Inhale 2 puffs into the lungs daily. ) 1 Inhaler 12  . CARTIA XT 120 MG 24 hr capsule TAKE ONE CAPSULE BY MOUTH DAILY 30 capsule 6  . cetirizine (ZYRTEC) 10 MG tablet Take 10 mg by mouth daily as needed for allergies.     . Cholecalciferol (NATURAL VITAMIN D-3 PO) Take 6,000 Units by mouth daily.    . diphenoxylate-atropine (LOMOTIL) 2.5-0.025 MG tablet Take 1 tablet by mouth 4 (four) times daily as needed for diarrhea or loose stools.    Marland Kitchen ELIQUIS 5 MG TABS tablet TAKE ONE TABLET (5MG TOTAL) BY MOUTH TWODAILY (Patient taking differently: Take 5 mg by mouth 2 (two) times daily. ) 180 tablet  1  . fluticasone (FLONASE) 50 MCG/ACT nasal spray Place 2 sprays into both nostrils daily as needed for allergies or rhinitis.    Marland Kitchen gabapentin (NEURONTIN) 300 MG capsule Take 300 mg by mouth 2 (two) times daily.     . methocarbamol (ROBAXIN) 750 MG tablet Take 1 tablet (750 mg total) by mouth 4 (four) times daily as needed (use for muscle cramps/pain). 20 tablet 0  . metoprolol tartrate (LOPRESSOR) 25 MG tablet TAKE ONE TABLET BY MOUTH TWICE A DAY 180 tablet  3  . mycophenolate (CELLCEPT) 500 MG tablet Take 2 tablets by mouth twice a day 120 tablet 3  . OFEV 100 MG CAPS TAKE 1 CAPSULE TWICE A DAY 60 capsule 11  . Omega-3 Fatty Acids (FISH OIL) 500 MG CAPS Take 1 capsule by mouth 2 (two) times daily.    . ondansetron (ZOFRAN) 4 MG tablet Take 4 mg by mouth every 8 (eight) hours as needed for nausea or vomiting.    Marland Kitchen PREDNISONE PO Take 7 mg by mouth daily.    . Probiotic Product (PROBIOTIC DAILY PO) Take 1 tablet by mouth daily.    Marland Kitchen sulfamethoxazole-trimethoprim (BACTRIM DS) 800-160 MG tablet TAKE ONE TABLET BY MOUTH TWICE A DAY ON MONDAY, WEDNESDAY, AND FRIDAY 12 tablet 5  . tamsulosin (FLOMAX) 0.4 MG CAPS capsule Take 0.4 mg by mouth 2 (two) times daily.      No facility-administered medications prior to visit.    Review of Systems  Constitutional: Negative for chills, fever and weight loss.  HENT: Negative.   Eyes: Negative.   Respiratory: Negative for cough.        Progressively developing SOB  Cardiovascular: Negative for chest pain and leg swelling.  Gastrointestinal: Negative for abdominal pain, constipation, nausea and vomiting.  Musculoskeletal: Negative.   Skin: Negative.   Neurological: Negative.      Objective:   Vitals:   07/21/19 1450  BP: 130/78  Pulse: 90  Temp: (!) 97.1 F (36.2 C)  TempSrc: Temporal  SpO2: 95%  Weight: 188 lb 9.6 oz (85.5 kg)  Height: '5\' 8"'  (1.727 m)   95% on RA BMI Readings from Last 3 Encounters:  07/21/19 28.68 kg/m  05/26/19 27.47 kg/m  05/05/19 27.02 kg/m   Wt Readings from Last 3 Encounters:  07/21/19 188 lb 9.6 oz (85.5 kg)  05/26/19 186 lb (84.4 kg)  05/05/19 183 lb (83 kg)    Physical Exam Vitals reviewed.  Constitutional:      Appearance: Normal appearance. He is not ill-appearing.  HENT:     Head: Normocephalic and atraumatic.  Eyes:     General: No scleral icterus. Cardiovascular:     Rate and Rhythm: Normal rate and regular rhythm.  Pulmonary:     Comments:  Breathing comfortably on room air, no conversational dyspnea.  Note is coughing.  Clear to auscultation bilaterally. Abdominal:     Comments: Abdominal binder in place  Musculoskeletal:        General: No swelling or deformity.     Cervical back: Neck supple.  Lymphadenopathy:     Cervical: No cervical adenopathy.  Skin:    General: Skin is warm and dry.     Findings: No rash.  Neurological:     General: No focal deficit present.     Mental Status: He is alert.     Motor: No weakness.     Coordination: Coordination normal.  Psychiatric:        Mood and Affect: Mood normal.  Behavior: Behavior normal.      CBC    Component Value Date/Time   WBC 11.6 (H) 01/29/2019 0107   RBC 3.54 (L) 01/29/2019 0107   HGB 13.4 01/29/2019 0107   HCT 37.5 (L) 01/29/2019 0107   PLT 180 01/29/2019 0107   MCV 105.9 (H) 01/29/2019 0107   MCH 37.9 (H) 01/29/2019 0107   MCHC 35.7 01/29/2019 0107   RDW 13.7 01/29/2019 0107   LYMPHSABS 2.0 01/29/2019 0107   MONOABS 0.7 01/29/2019 0107   EOSABS 0.0 01/29/2019 0107   BASOSABS 0.0 01/29/2019 0107     Previous labs reviewed: November 2017 reviewed: CRP 139, total CK normal, antineutrophil cytoplasmic antibody testing negative  October 2019 alpha-1 antitrypsin M-M  Chest Imaging- films reviewed: CT chest 12/06/2016 - Groundglass opacities and interlobular septal thickening in the lower lobes. No honeycombing. No adenopathy or pleural disease.     CT abdomen pelvis 07/01/2018-  Scars in the RLL and RML from previous biopsy. Persistent GGO and interlobular septal thickening. Does not appear to have progressive fibrosis compared to previous CT scan.  Pulmonary Functions Testing Results: PFT Results Latest Ref Rng & Units 07/15/2019 01/10/2018 10/26/2016 05/03/2016  FVC-Pre L 3.58 3.52 4.21 3.55  FVC-Predicted Pre % 88 82 96 80  FVC-Post L 3.60 3.53 4.08 3.65  FVC-Predicted Post % 88 83 93 83  Pre FEV1/FVC % % 76 81 78 81  Post FEV1/FCV % % 79 82  81 83  FEV1-Pre L 2.72 2.85 3.30 2.88  FEV1-Predicted Pre % 91 91 102 88  FEV1-Post L 2.85 2.88 3.30 3.02  DLCO UNC% % 80 68 84 75  DLCO COR %Predicted % 90 81 87 91  TLC L 5.86 7.25 6.79 5.85  TLC % Predicted % 88 106 98 84  RV % Predicted % 94 155 102 94   2021: No significant obstruction or restriction.  Normal diffusion capacity.  No significant response bronchodilators.  No longer air trapping (TLC reduced compared to previous, but FVC improved).   Pathology 03/01/2018: Biopsies from right upper lobe, right middle lobe, right lower lobe showing temporal and spatial variation in a pattern of fibrosing interstitial pneumonia.  Prominent fibroblastic foci numerous areas of osseous metaplasia.  No honeycombing.  Areas of spared alveoli.  Echocardiogram 03/22/2016: LVEF 60 to 65%, normal wall motion, grade 1 diastolic dysfunction.  Normal RV.  Normal left atrium.  Normal valves    Assessment & Plan:     ICD-10-CM   1. Interstitial pulmonary disease (HCC)  J84.9 CT Chest High Resolution    Comp Met (CMET)    Comp Met (CMET)    CANCELED: COMPLETE METABOLIC PANEL WITH GFR     Fibrosing NSIP vs UIP on nintedanib & mycophenolate.  PFTs demonstrate significant reduction in TLC since 2019.  Had been doing well with uninterrupted Ofev, but likely had progression of disease during the months that he was off. -Continue current meds-CellCept, Bactrim Monday Wednesday Friday, nintedanib  -CMP today -Repeat HRCT chest -If progression on CT, would recommend trial of increasing Ofev to 150 mg twice daily.  He would require monthly LFTs for the first few months of this change.  We would likely prophylactically use antidiarrheals and antiemetics to help with symptoms.   -Up-to-date on Covid, seasonal flu, pneumonia vaccines  Chronic immunosuppression- prednisone & mycophenolate -Continue Bactrim 3 days/week -Appreciate Dr. Melissa Noon input  Hyperbilirubinemia w/o transaminase elevation, in the past   -CMP pending   RTC in 3-4  months.  We  will follow up after CT chest.   Current Outpatient Medications:  .  acetaminophen (TYLENOL) 500 MG tablet, Take 1,000 mg by mouth every 6 (six) hours as needed for mild pain or headache. , Disp: , Rfl:  .  albuterol (PROVENTIL HFA;VENTOLIN HFA) 108 (90 Base) MCG/ACT inhaler, Inhale 2 puffs into the lungs every 4 (four) hours as needed for wheezing or shortness of breath. , Disp: , Rfl:  .  Bempedoic Acid-Ezetimibe (NEXLIZET) 180-10 MG TABS, Take 1 mg by mouth daily., Disp: 90 tablet, Rfl: 3 .  budesonide-formoterol (SYMBICORT) 80-4.5 MCG/ACT inhaler, Inhale 2 puffs into the lungs 2 (two) times daily. (Patient taking differently: Inhale 2 puffs into the lungs daily. ), Disp: 1 Inhaler, Rfl: 12 .  CARTIA XT 120 MG 24 hr capsule, TAKE ONE CAPSULE BY MOUTH DAILY, Disp: 30 capsule, Rfl: 6 .  cetirizine (ZYRTEC) 10 MG tablet, Take 10 mg by mouth daily as needed for allergies. , Disp: , Rfl:  .  Cholecalciferol (NATURAL VITAMIN D-3 PO), Take 6,000 Units by mouth daily., Disp: , Rfl:  .  diphenoxylate-atropine (LOMOTIL) 2.5-0.025 MG tablet, Take 1 tablet by mouth 4 (four) times daily as needed for diarrhea or loose stools., Disp: , Rfl:  .  ELIQUIS 5 MG TABS tablet, TAKE ONE TABLET (5MG TOTAL) BY MOUTH TWODAILY (Patient taking differently: Take 5 mg by mouth 2 (two) times daily. ), Disp: 180 tablet, Rfl: 1 .  fluticasone (FLONASE) 50 MCG/ACT nasal spray, Place 2 sprays into both nostrils daily as needed for allergies or rhinitis., Disp: , Rfl:  .  gabapentin (NEURONTIN) 300 MG capsule, Take 300 mg by mouth 2 (two) times daily. , Disp: , Rfl:  .  methocarbamol (ROBAXIN) 750 MG tablet, Take 1 tablet (750 mg total) by mouth 4 (four) times daily as needed (use for muscle cramps/pain)., Disp: 20 tablet, Rfl: 0 .  metoprolol tartrate (LOPRESSOR) 25 MG tablet, TAKE ONE TABLET BY MOUTH TWICE A DAY, Disp: 180 tablet, Rfl: 3 .  mycophenolate (CELLCEPT) 500 MG tablet, Take  2 tablets by mouth twice a day, Disp: 120 tablet, Rfl: 3 .  OFEV 100 MG CAPS, TAKE 1 CAPSULE TWICE A DAY, Disp: 60 capsule, Rfl: 11 .  Omega-3 Fatty Acids (FISH OIL) 500 MG CAPS, Take 1 capsule by mouth 2 (two) times daily., Disp: , Rfl:  .  ondansetron (ZOFRAN) 4 MG tablet, Take 4 mg by mouth every 8 (eight) hours as needed for nausea or vomiting., Disp: , Rfl:  .  PREDNISONE PO, Take 7 mg by mouth daily., Disp: , Rfl:  .  Probiotic Product (PROBIOTIC DAILY PO), Take 1 tablet by mouth daily., Disp: , Rfl:  .  sulfamethoxazole-trimethoprim (BACTRIM DS) 800-160 MG tablet, TAKE ONE TABLET BY MOUTH TWICE A DAY ON MONDAY, WEDNESDAY, AND FRIDAY, Disp: 12 tablet, Rfl: 5 .  tamsulosin (FLOMAX) 0.4 MG CAPS capsule, Take 0.4 mg by mouth 2 (two) times daily. , Disp: , Rfl:    Julian Hy, DO [facility] 07/21/2019 7:20 PM

## 2019-07-21 NOTE — Telephone Encounter (Signed)
I called and spoke with the patient and made him aware that once the results come in we will see that they are sent to Dr. Nevada Crane.   Mandi, Will you please follow up with lab results and make sure that Dr. Nevada Crane gets a copy. Thank you

## 2019-07-22 LAB — COMPREHENSIVE METABOLIC PANEL
ALT: 17 U/L (ref 0–53)
AST: 15 U/L (ref 0–37)
Albumin: 3.8 g/dL (ref 3.5–5.2)
Alkaline Phosphatase: 52 U/L (ref 39–117)
BUN: 13 mg/dL (ref 6–23)
CO2: 27 mEq/L (ref 19–32)
Calcium: 9.5 mg/dL (ref 8.4–10.5)
Chloride: 103 mEq/L (ref 96–112)
Creatinine, Ser: 0.91 mg/dL (ref 0.40–1.50)
GFR: 81.92 mL/min (ref >60.00)
Glucose, Bld: 131 mg/dL — ABNORMAL HIGH (ref 70–99)
Potassium: 4.3 mEq/L (ref 3.5–5.1)
Sodium: 139 mEq/L (ref 135–145)
Total Bilirubin: 1 mg/dL (ref 0.2–1.2)
Total Protein: 6.1 g/dL (ref 6.0–8.3)

## 2019-07-22 NOTE — Progress Notes (Signed)
Please let [name] know that his labs look stable.  No changes to our plan.  Thanks!

## 2019-07-23 NOTE — Telephone Encounter (Signed)
Lab results are back, they were sent to PCP office via Epic. Called and spoke to pt. Informed him of the results being sent to PCP. Pt also requesting at next visit with Dr. Carlis Abbott a more detailed report of the office visit. Advised pt to speak with Dr. Carlis Abbott at his next visit to discuss what information he would like included on his AVS. Pt verbalized understanding and denied any further questions or concerns at this time.

## 2019-07-27 ENCOUNTER — Ambulatory Visit (HOSPITAL_COMMUNITY): Payer: PPO

## 2019-07-30 ENCOUNTER — Other Ambulatory Visit: Payer: Self-pay

## 2019-07-30 ENCOUNTER — Ambulatory Visit (HOSPITAL_COMMUNITY)
Admission: RE | Admit: 2019-07-30 | Discharge: 2019-07-30 | Disposition: A | Discharge status: Home / Self Care | Payer: PPO | Source: Ambulatory Visit | Attending: Critical Care Medicine | Admitting: Critical Care Medicine

## 2019-07-30 ENCOUNTER — Telehealth: Payer: Self-pay | Admitting: Critical Care Medicine

## 2019-07-30 DIAGNOSIS — J849 Interstitial pulmonary disease, unspecified: Secondary | ICD-10-CM | POA: Diagnosis not present

## 2019-07-30 DIAGNOSIS — J8410 Pulmonary fibrosis, unspecified: Secondary | ICD-10-CM | POA: Diagnosis not present

## 2019-07-30 DIAGNOSIS — R0602 Shortness of breath: Secondary | ICD-10-CM | POA: Diagnosis not present

## 2019-07-30 NOTE — Telephone Encounter (Signed)
I attempted to reach [name] to discuss his CT scan.  I left a message will call back him tomorrow.  Julian Hy, DO 07/30/19 6:25 PM [facility]

## 2019-07-31 DIAGNOSIS — F5101 Primary insomnia: Secondary | ICD-10-CM | POA: Diagnosis not present

## 2019-07-31 DIAGNOSIS — I2510 Atherosclerotic heart disease of native coronary artery without angina pectoris: Secondary | ICD-10-CM | POA: Diagnosis not present

## 2019-07-31 DIAGNOSIS — M25519 Pain in unspecified shoulder: Secondary | ICD-10-CM | POA: Diagnosis not present

## 2019-07-31 DIAGNOSIS — Z433 Encounter for attention to colostomy: Secondary | ICD-10-CM | POA: Diagnosis not present

## 2019-07-31 DIAGNOSIS — E0965 Drug or chemical induced diabetes mellitus with hyperglycemia: Secondary | ICD-10-CM | POA: Diagnosis not present

## 2019-07-31 DIAGNOSIS — K5780 Diverticulitis of intestine, part unspecified, with perforation and abscess without bleeding: Secondary | ICD-10-CM | POA: Diagnosis not present

## 2019-07-31 DIAGNOSIS — I4581 Long QT syndrome: Secondary | ICD-10-CM | POA: Diagnosis not present

## 2019-07-31 DIAGNOSIS — B370 Candidal stomatitis: Secondary | ICD-10-CM | POA: Diagnosis not present

## 2019-07-31 DIAGNOSIS — Z48815 Encounter for surgical aftercare following surgery on the digestive system: Secondary | ICD-10-CM | POA: Diagnosis not present

## 2019-07-31 DIAGNOSIS — R0602 Shortness of breath: Secondary | ICD-10-CM | POA: Diagnosis not present

## 2019-07-31 DIAGNOSIS — Z6826 Body mass index (BMI) 26.0-26.9, adult: Secondary | ICD-10-CM | POA: Diagnosis not present

## 2019-07-31 DIAGNOSIS — R945 Abnormal results of liver function studies: Secondary | ICD-10-CM | POA: Diagnosis not present

## 2019-08-03 ENCOUNTER — Telehealth: Payer: Self-pay | Admitting: Critical Care Medicine

## 2019-08-03 ENCOUNTER — Telehealth: Payer: Self-pay | Admitting: Pulmonary Disease

## 2019-08-03 MED ORDER — ALBUTEROL SULFATE HFA 108 (90 BASE) MCG/ACT IN AERS: 2.0000 | INHALATION_SPRAY | Freq: Four times a day (QID) | RESPIRATORY_TRACT | 11 refills | Status: DC | PRN

## 2019-08-03 NOTE — Telephone Encounter (Signed)
error 

## 2019-08-03 NOTE — Telephone Encounter (Signed)
I spoke to Mr. Knapke about his SOB. This was a sudden episode but has improved now that he is resting and he used his symbicort. He previously had several episodes of thrush while on Symbicort, and had no change in symptoms since discontinuing it.  Albuterol refilled to his pharmacy  We discussed his CT scan results that do not show significant change in disease burden.  I previously discussed with Dr. Chase Caller.  I am concerned that his interval off of Ofev in the fall perioperatively may have led to his decline, but it is not easy to tell if he has ongoing decline versus stable disease now that he is back on Ofev.  It is reasonable to try and increase his Ofev to 150 mg twice daily, which he previously was unable to tolerate.  I discussed with Mr. Groseclose, who is willing to try again.  We can use antiemetics and diarrhea medications to help him tolerate this is best as he can.  He should have a CMP within 1 month of starting the higher dose to monitor for changes in liver function.  Julian Hy, DO 08/03/19 3:16 PM [facility]

## 2019-08-03 NOTE — Telephone Encounter (Signed)
[facility].  Pharmacist stated Patient's insurance will only cover Proair for emergency inhaler. Pharmacist stated they were changing prescription from albuterol to proair. Nothing further at this time.

## 2019-08-03 NOTE — Telephone Encounter (Signed)
Spoke with pt. Pt is currently SOB while on the phone. Pt was questioning if he should use his albuterol inhaler or his Symbicort inhaler. Advised pt he could use the albuterol for quick relief. Pt took one puff and realized it was empty. Pt calmed down shortly after talking. Pt has on his med list Symbicort and albuterol hfa. Pt states he took himself off of the Symbicort as it caused recurrent thrush. Pt has history of ILD. Pt aware to seek emergency care if any worsening s/s.   Dr. Carlis Abbott please advise if you would want pt to have albuterol or alternative to [facility]. Thanks.

## 2019-08-03 NOTE — Telephone Encounter (Signed)
I think I accidentally closed the phone encounter that had a message for me so you may not be able to see it- can we please move [name]'s appt back to late April?  Also can we do the paperwork to change his Ofev to 150mg  twice daily (currently he is on 100mg  twice daily)? Thanks!

## 2019-08-04 ENCOUNTER — Encounter: Payer: Self-pay | Admitting: Pulmonary Disease

## 2019-08-04 ENCOUNTER — Telehealth: Payer: Self-pay | Admitting: Critical Care Medicine

## 2019-08-04 ENCOUNTER — Other Ambulatory Visit: Payer: Self-pay

## 2019-08-04 ENCOUNTER — Ambulatory Visit (INDEPENDENT_AMBULATORY_CARE_PROVIDER_SITE_OTHER): Payer: PPO

## 2019-08-04 ENCOUNTER — Ambulatory Visit: Payer: PPO | Admitting: Pulmonary Disease

## 2019-08-04 VITALS — BP 118/70 | HR 82 | Temp 97.4°F | Ht 68.0 in | Wt 180.0 lb

## 2019-08-04 DIAGNOSIS — J849 Interstitial pulmonary disease, unspecified: Secondary | ICD-10-CM

## 2019-08-04 DIAGNOSIS — R0602 Shortness of breath: Secondary | ICD-10-CM

## 2019-08-04 MED ORDER — STIOLTO RESPIMAT 2.5-2.5 MCG/ACT IN AERS: 2.0000 | INHALATION_SPRAY | Freq: Every day | RESPIRATORY_TRACT | 0 refills | Status: DC

## 2019-08-04 NOTE — Telephone Encounter (Signed)
Noted.   [name]

## 2019-08-04 NOTE — Assessment & Plan Note (Signed)
2 long episodes of shortness of breath over the last 24 hours Potential acute on chronic flare of ILD Chest x-ray today clear Wells criteria score for PE 0 Patient maintained on Eliquis Walk today in office, no oxygen desaturations Trace lower extremity swelling left lower leg Last echocardiogram 2017  Plan: Lab work today If BNP elevated consider short trial of diuretic, can also consider repeating echocardiogram Also may need to consider right heart cath based off of echocardiogram results Trial of Stiolto Respimat Stop Symbicort 80

## 2019-08-04 NOTE — Progress Notes (Signed)
I agree, thanks!  Julian Hy, DO 08/04/19 6:18 PM [facility]

## 2019-08-04 NOTE — Telephone Encounter (Signed)
Yes please, thanks!  Can you make him an acute visit as well?  LPC

## 2019-08-04 NOTE — Patient Instructions (Addendum)
You were seen today by Lauraine Rinne, NP  for:   Thank you for coming in today.  I am glad you were able to be seen.  Your chest x-ray today was clear without any acute abnormalities.  This is good news.  We will order one lab test today to check for fluid retention.  I do not believe that we need to order a D-dimer since she did not have any desaturations on your walk and you are currently maintained on Eliquis.  We will have you keep close follow-up with our office in about 4 weeks.  Sooner if you need it.  Continue Ofev  Good seeing you again, take care of yourself and stay safe,  [name]  1. Shortness of breath  - B Nat Peptide; Future  Trial of Stiolto Respimat inhaler >>>2 puffs daily >>>Take this no matter what >>>This is not a rescue inhaler  Stop Symbicort 80  2. ILD (interstitial lung disease) (HCC)  Continue CellCept Continue Bactrim Monday Wednesday Friday Continue Ofev 150 mg twice daily    We recommend today:  Orders Placed This Encounter  Procedures  . B Nat Peptide    Standing Status:   Future    Standing Expiration Date:   08/03/2020   Orders Placed This Encounter  Procedures  . B Nat Peptide   No orders of the defined types were placed in this encounter.   Follow Up:    Return in about 23 days (around 08/27/2019), or if symptoms worsen or fail to improve, for Follow up with Dr. Carlis Abbott.   Please do your part to reduce the spread of COVID-19:      Reduce your risk of any infection  and COVID19 by using the similar precautions used for avoiding the common cold or flu:  Marland Kitchen Wash your hands often with soap and warm water for at least 20 seconds.  If soap and water are not readily available, use an alcohol-based hand sanitizer with at least 60% alcohol.  . If coughing or sneezing, cover your mouth and nose by coughing or sneezing into the elbow areas of your shirt or coat, into a tissue or into your sleeve (not your hands). Langley Gauss A MASK when in public    . Avoid shaking hands with others and consider head nods or verbal greetings only. . Avoid touching your eyes, nose, or mouth with unwashed hands.  . Avoid close contact with people who are sick. . Avoid places or events with large numbers of people in one location, like concerts or sporting events. . If you have some symptoms but not all symptoms, continue to monitor at home and seek medical attention if your symptoms worsen. . If you are having a medical emergency, call 911.   [place] / e-Visit: [email or web address]         [facility]: [facility]: [id number]                   [facility]: [id number]     It is flu season:   >>> Best ways to protect herself from the flu: Receive the yearly flu vaccine, practice good hand hygiene washing with soap and also using hand sanitizer when available, eat a nutritious meals, get adequate rest, hydrate appropriately   Please contact the office if your symptoms worsen or you have concerns that you are not improving.   Thank you  for choosing [facility] for your healthcare, and for allowing [place] to partner with you on your healthcare journey. I am thankful to be able to provide care to you today.   Wyn Quaker FNP-C

## 2019-08-04 NOTE — Telephone Encounter (Signed)
Called and spoke with pt. Pt stated he spoke with Dr. Carlis Abbott yesterday in regards to his SOB. Pt is wanting to come in to the office to get a cxr as he is still having SOB.  Dr. Carlis Abbott, please advise if you are fine with [place] ordering the cxr.

## 2019-08-04 NOTE — Assessment & Plan Note (Signed)
Walk today in office stable Chest x-ray today clear  Plan: Trial of Stiolto Respimat Lab work today Keep close follow-up with our office in 4 weeks Continue CellCept Continue Bactrim Monday Wednesday Friday Continue Ofev 100 mg taken twice daily until you are able to obtain 150 mg tablet, then increase Ofev to 150 mg twice daily

## 2019-08-04 NOTE — Progress Notes (Signed)
@Patient  ID: [name], male    DOB: 06-28-1945, 74 y.o.   MRN: [id number]  Chief Complaint  Patient presents with  . Follow-up    F/U on SOB for the past 2 days. Denies any chest pain or back pain. Also denies any coughing that increased.     Referring provider: [name], MD  HPI:  74 year old male never smoker followed in our office for interstitial lung disease  PMH: Hypertrophic cardiomyopathy, hyperlipidemia, hypertension, colostomy in place Smoker/ Smoking History: Never smoker Maintenance: Mycophenolate, OFEV 100mg  BID, Bactrim twice daily Pt of: Dr. Carlis Abbott  08/04/2019  - Visit   74 year old male never smoker followed in our office for interstitial lung disease.  Patient presented to the office today as an acute visit.  He is completed a chest x-ray prior to his office visit.  Patient was last seen in March/2021 by Dr. Carlis Abbott.  At that office visit he was encouraged to continue CellCept, Bactrim Monday Wednesday Friday.  It was requested to have him repeat a high-resolution CT chest as well as blood work.  He was encouraged to continue follow-up with rheumatology [name].  07/30/2019-CT chest high-res-no significant change in mild pulmonary fibrosis with slight apical to basilar gradient featuring tubular bronchiectasis in the lower lobes, peripheral irregular interstitial pulmonary opacity and no evidence of bronchiolectasis or honeycombing.  Findings are indeterminate for UIP  Patient reported to our office yesterday that he stopped taking Symbicort due to it causing recurrent thrush.  He was encouraged to increase Ofev to 150 mg twice daily.  He reports that this prescription was sent into [facility] pharmacy but this has not been coordinated yet.  He is currently still taking Ofev 100 mg twice daily.  Patient reporting that he developed acute onset shortness of breath that persisted throughout most the day yesterday and throughout last night.  He  denied acute chest pain but he does have some chest tightness.  He feels that this is more intense shortness of breath that he has had in the past.  He is currently not on any inhalers.  He previously stopped taking his Symbicort 80 6 months ago due to no clinical response as well as recurrent thrush.  He obtained a rescue inhaler from the pharmacy yesterday  Wells Criteria Modified Wells criteria: Clinical assessment for PE Clinical symptoms of DVT (leg swelling, pain with palpation) - 3 Other diagnoses less likely than pulmonary embolism - 3 Heart rate greater than 100 - 1.5 Immobilization (disease greater in 3 days) or surgery in the previous 4 weeks - 1.5 Previous DVT/PE - 1.5 Hemoptysis - 1 Malignancy - 1  Probability Traditional clinical probability assessment (Wells criteria) High - greater than 6 Moderate - 2 to 6 Low less than 2  Simplify clinical probability assessment (modified Wells criteria) PE likely-greater than 4 PE unlikely little less than or equal to 4   Questionaires / Pulmonary Flowsheets:   Epworth:  Results of the Epworth flowsheet 10/26/2016  Sitting and reading 0  Watching TV 1  Sitting, inactive in a public place (e.g. a theatre or a meeting) 0  As a passenger in a car for an hour without a break 0  Lying down to rest in the afternoon when circumstances permit 1  Sitting and talking to someone 0  Sitting quietly after a lunch without alcohol 0  In a car, while stopped for a few minutes in traffic 0  Total score 2    Tests:  November 2017 reviewed: CRP 139, total CK normal, antineutrophil cytoplasmic antibody testing negative  October 2019 alpha-1 antitrypsin M-M  Chest Imaging- films reviewed: CT chest 12/06/2016 - Groundglass opacities and interlobular septal thickening in the lower lobes. No honeycombing. No adenopathy or pleural disease.     CT abdomen pelvis 07/01/2018-  Scars in the RLL and RML from previous biopsy. Persistent GGO and  interlobular septal thickening. Does not appear to have progressive fibrosis compared to previous CT scan.  07/30/2019-CT chest high-res-no significant change in mild pulmonary fibrosis with slight apical to basilar gradient featuring tubular bronchiectasis in the lower lobes, peripheral irregular interstitial pulmonary opacity and no evidence of bronchiolectasis or honeycombing.  Findings are indeterminate for UIP   Pathology 03/01/2018: Biopsies from right upper lobe, right middle lobe, right lower lobe showing temporal and spatial variation in a pattern of fibrosing interstitial pneumonia.  Prominent fibroblastic foci numerous areas of osseous metaplasia.  No honeycombing.  Areas of spared alveoli.  Echocardiogram 03/22/2016: LVEF 60 to 65%, normal wall motion, grade 1 diastolic dysfunction.  Normal RV.  Normal left atrium.  Normal valves      FENO:  Lab Results  Component Value Date   NITRICOXIDE 8 03/03/2018    PFT: PFT Results Latest Ref Rng & Units 07/15/2019 01/10/2018 10/26/2016 05/03/2016  FVC-Pre L 3.58 3.52 4.21 3.55  FVC-Predicted Pre % 88 82 96 80  FVC-Post L 3.60 3.53 4.08 3.65  FVC-Predicted Post % 88 83 93 83  Pre FEV1/FVC % % 76 81 78 81  Post FEV1/FCV % % 79 82 81 83  FEV1-Pre L 2.72 2.85 3.30 2.88  FEV1-Predicted Pre % 91 91 102 88  FEV1-Post L 2.85 2.88 3.30 3.02  DLCO UNC% % 80 68 84 75  DLCO COR %Predicted % 90 81 87 91  TLC L 5.86 7.25 6.79 5.85  TLC % Predicted % 88 106 98 84  RV % Predicted % 94 155 102 94    WALK:  No flowsheet data found.  Imaging: DG Chest 2 View  Result Date: 08/04/2019 CLINICAL DATA:  Shortness of breath. EXAM: CHEST - 2 VIEW COMPARISON:  March 18, 2018. FINDINGS: The heart size and mediastinal contours are within normal limits. No pneumothorax or pleural effusion is noted. Left lung is clear. Postsurgical changes are noted in right upper lobe. Stable scarring is noted in right basilar region. No definite acute abnormality is noted.  The visualized skeletal structures are unremarkable. IMPRESSION: Stable postsurgical changes seen in right lung. No acute cardiopulmonary abnormality seen. Electronically Signed   By: Marijo Conception M.D.   On: 08/04/2019 14:44   CT Chest High Resolution  Result Date: 07/30/2019 CLINICAL DATA:  Worsening shortness of breath, history of pulmonary fibrosis, UIP versus NSIP EXAM: CT CHEST WITHOUT CONTRAST TECHNIQUE: Multidetector CT imaging of the chest was performed following the standard protocol without intravenous contrast. High resolution imaging of the lungs, as well as inspiratory and expiratory imaging, was performed. COMPARISON:  CT chest, 12/06/2017, 06/13/2016, 04/02/2016 FINDINGS: Cardiovascular: Aortic atherosclerosis. Normal heart size. No pericardial effusion. Mediastinum/Nodes: No enlarged mediastinal, hilar, or axillary lymph nodes. Thyroid gland, trachea, and esophagus demonstrate no significant findings. Lungs/Pleura: Wedge resections of the right lung. Redemonstrated mild pulmonary fibrosis with a slight apical to basal gradient featuring tubular bronchiectasis in the lower lobes, peripheral irregular interstitial pulmonary opacity, and no evidence of bronchiolectasis or honeycombing. Fibrotic findings are not significantly changed compared to prior examinations. No significant air trapping on expiratory phase examination.  No pleural effusion or pneumothorax. Upper Abdomen: No acute abnormality. Musculoskeletal: No chest wall mass or suspicious bone lesions identified. IMPRESSION: 1. No significant change in mild pulmonary fibrosis with a slight apical to basal gradient featuring tubular bronchiectasis in the lower lobes, peripheral irregular interstitial pulmonary opacity, and no evidence of bronchiolectasis or honeycombing. No significant air trapping on expiratory phase examination. Findings remain consistent with "indeterminate for UIP" pattern fibrosis by ATS pulmonary fibrosis criteria.  Findings are indeterminate for UIP per consensus guidelines: Diagnosis of Idiopathic Pulmonary Fibrosis: An Official ATS/ERS/JRS/ALAT Clinical Practice Guideline. [place], Iss 5, [id number], Jan 12 2017. 2.  Interval wedge biopsies of the right lung. 3.  Aortic Atherosclerosis (ICD10-I70.0). Electronically Signed   By: Eddie Candle M.D.   On: 07/30/2019 16:33   DG BONE DENSITY (DXA)  Result Date: 07/16/2019 EXAM: DUAL X-RAY ABSORPTIOMETRY (DXA) FOR BONE MINERAL DENSITY IMPRESSION: Referring Physician:  Rosita Kea Your patient completed a BMD test using Lunar IDXA DXA system ( analysis version: 16 ) manufactured by [employer]. Technologist: WLS PATIENT: Name: Arvel, Alessio Patient ID: [id number] Birth Date: 1946-01-05 Height: 68.0 in. Sex: Male Measured: 07/16/2019 Weight: 182.6 lbs. Indications: Advanced Age, Caucasian, Gabapentin, Glucocorticoids (Chronic) (255.41), Low Calcium Intake (269.3), Rheumatoid Arthritis (714.0) Fractures: None Treatments: Vitamin D (E933.5) ASSESSMENT: The BMD measured at Femur Neck Left is 0.698 g/cm2 with a T-score of -2.4. This patient is considered OSTEOPENIC according to [facility]) criteria. The scan quality is limited by patient body habitus. Lumbar spine was not utilized due to surgical hardware. Site Region Measured Date Measured Age YA T-score BMD Significant CHANGE DualFemur Neck Left 07/16/2019 71.8 -2.4 0.698 g/cm2 Left Forearm Radius 33% 07/16/2019 71.8 -0.2 0.979 g/cm2 World Health Organization [facility]) criteria for post-menopausal, Caucasian Women: Normal       T-score at or above -1 SD Osteopenia   T-score between -1 and -2.5 SD Osteoporosis T-score at or below -2.5 SD RECOMMENDATION: 1. All patients should optimize calcium and vitamin D intake. 2. Consider FDA approved medical therapies in postmenopausal women and men aged 38 years and older, based on the following: a. A hip or vertebral (clinical or morphometric)  fracture b. T- score < or = -2.5 at the femoral neck or spine after appropriate evaluation to exclude secondary causes c. Low bone mass (T-score between -1.0 and -2.5 at the femoral neck or spine) and a 10 year probability of a hip fracture > or = 3% or a 10 year probability of a major osteoporosis-related fracture > or = 20% based on the [place]-adapted WHO algorithm d. Clinician judgment and/or patient preferences may indicate treatment for people with 10-year fracture probabilities above or below these levels FOLLOW-UP: Patients with diagnosis of osteoporosis or at high risk for fracture should have regular bone mineral density tests. For patients eligible for Medicare, routine testing is allowed once every 2 years. The testing frequency can be increased to one year for patients who have rapidly progressing disease, those who are receiving or discontinuing medical therapy to restore bone mass, or have additional risk factors. I have reviewed this report and agree with the above findings. Mark A. Thornton Papas, M.D. [facility] FRAX* 10-year Probability of Fracture Based on femoral neck BMD: DualFemur (Left) Major Osteoporotic Fracture: 21.3% Hip Fracture:                9.7% Population:  [place] (Caucasian) Risk Factors: Glucocorticoids (Chronic) (255.41), Rheumatoid Arthritis (714.0) *FRAX is a Materials engineer of the [employer] for Metabolic Bone Disease, a World Pharmacologist (WHO) [facility]. ASSESSMENT: The probability of a major osteoporotic fracture is 21.3 % within the next ten years. The probability of a hip fracture is 9.7 % within the next ten years. I have reviewed this report and agree with the above findings. Mark A. Thornton Papas, M.D. [facility] Electronically Signed   By: Lavonia Dana M.D.   On: 07/16/2019 13:43    Lab Results:  CBC    Component Value Date/Time   WBC 11.6 (H) 01/29/2019 0107   RBC 3.54 (L) 01/29/2019 0107   HGB 13.4  01/29/2019 0107   HCT 37.5 (L) 01/29/2019 0107   PLT 180 01/29/2019 0107   MCV 105.9 (H) 01/29/2019 0107   MCH 37.9 (H) 01/29/2019 0107   MCHC 35.7 01/29/2019 0107   RDW 13.7 01/29/2019 0107   LYMPHSABS 2.0 01/29/2019 0107   MONOABS 0.7 01/29/2019 0107   EOSABS 0.0 01/29/2019 0107   BASOSABS 0.0 01/29/2019 0107    BMET    Component Value Date/Time   NA 139 07/21/2019 1542   K 4.3 07/21/2019 1542   CL 103 07/21/2019 1542   CO2 27 07/21/2019 1542   GLUCOSE 131 (H) 07/21/2019 1542   BUN 13 07/21/2019 1542   CREATININE 0.91 07/21/2019 1542   CREATININE 0.95 07/16/2016 0902   CALCIUM 9.5 07/21/2019 1542   GFRNONAA >60 01/29/2019 0107   GFRAA >60 01/29/2019 0107    BNP    Component Value Date/Time   BNP 53.0 04/02/2016 1435    ProBNP No results found for: PROBNP  Specialty Problems      Pulmonary Problems   ILD (interstitial lung disease) ([place])    04/2016 ANCA, Aldolase, CK, negative 05/2016 (off steroids)> ANA negative, CCP negative, SCL 70 negative, centromere negative, SSA/SSB negative, RF slightly elevated, Hypersensitivity pneumonitis panel negative 05/2016 HRCT > Mild subpleural reticulation and ground glass suggestive of NSIP, cannot exclude UIP  02/18/2018- right VATS, lung biopsy      Snoring   OSA (obstructive sleep apnea)    HST 11/2016 Moderate OSA -AHI 27/hr , min  O2 desats 81%. >rec CPAP  Will need ONO on CPAP to make sure desats resolve.        Shortness of breath      Allergies  Allergen Reactions  . Lidocaine Hcl Anaphylaxis and Other (See Comments)    Xylocaine  . Xylocaine [Lidocaine] Anaphylaxis  . Codeine Nausea And Vomiting  . Crestor [Rosuvastatin Calcium] Other (See Comments)    All-over body aches Myalgias   . Statins Other (See Comments)    Muscle soreness and an aching feeling all over Myalgias  . Percocet [Oxycodone-Acetaminophen] Itching    Immunization History  Administered Date(s) Administered  . Influenza Split  03/20/2016  . Influenza, High Dose Seasonal PF 01/29/2018, 02/25/2019, 03/20/2019  . Influenza,inj,Quad PF,6+ Mos 03/12/2017  . Moderna SARS-COVID-2 Vaccination 06/04/2019, 07/10/2019  . Pneumococcal Conjugate-13 01/10/2018    Past Medical History:  Diagnosis Date  . Allergy to alpha-gal    2015; has been able to re-introduce red meat for the past 2 1/2 years without reaction (as of 01/21/19)  . Arthritis   . Chest pain    a. 03/2017: echo showing EF of 60-65%, no regional WMA or significant valve abnormalities. b. 03/2016: NST with no evidence of ischemia.   . Complication of anesthesia    "  anaphylactic" reaction after given xylocaine for shoulder injection > 10 years ago; reported no known reaction when given marcaine (as of 01/21/19)   . Digestive problems    on Prednisone prn for this issue- diarrhea  . Dyspnea    with activity  . Dysrhythmia    irregular due to Myocarditis- takes Atenolol, Norvasc  . GERD (gastroesophageal reflux disease)   . H/O viral myocarditis    25 years ago  . Head injury, closed, with concussion    Breif LOC  . Hyperlipidemia   . Mild mitral valve prolapse    per Dr Evette Georges notes  . PAF (paroxysmal atrial fibrillation) ([place])    a. initially occuring in 02/2016. b. recurrent in 07/2016. Placed on Eliquis  . Polymyalgia rheumatica ([place])   . Pre-diabetes   . Pulmonary fibrosis ([place])   . Sleep apnea    mild sleep apnea   no CPAP    Tobacco History: Social History   Tobacco Use  Smoking Status Never Smoker  Smokeless Tobacco Never Used   Counseling given: Yes   Continue to not smoke  Outpatient Encounter Medications as of 08/04/2019  Medication Sig  . acetaminophen (TYLENOL) 500 MG tablet Take 1,000 mg by mouth every 6 (six) hours as needed for mild pain or headache.   . albuterol (VENTOLIN HFA) 108 (90 Base) MCG/ACT inhaler Inhale 2 puffs into the lungs every 6 (six) hours as needed.  . budesonide-formoterol (SYMBICORT) 80-4.5 MCG/ACT inhaler  Inhale 2 puffs into the lungs 2 (two) times daily. (Patient taking differently: Inhale 2 puffs into the lungs daily. )  . CARTIA XT 120 MG 24 hr capsule TAKE ONE CAPSULE BY MOUTH DAILY  . cetirizine (ZYRTEC) 10 MG tablet Take 10 mg by mouth daily as needed for allergies.   . Cholecalciferol (NATURAL VITAMIN D-3 PO) Take 6,000 Units by mouth daily.  . diphenoxylate-atropine (LOMOTIL) 2.5-0.025 MG tablet Take 1 tablet by mouth 4 (four) times daily as needed for diarrhea or loose stools.  Marland Kitchen ELIQUIS 5 MG TABS tablet TAKE ONE TABLET (5MG  TOTAL) BY MOUTH TWODAILY (Patient taking differently: Take 5 mg by mouth 2 (two) times daily. )  . fluticasone (FLONASE) 50 MCG/ACT nasal spray Place 2 sprays into both nostrils daily as needed for allergies or rhinitis.  Marland Kitchen gabapentin (NEURONTIN) 300 MG capsule Take 300 mg by mouth 2 (two) times daily.   . methocarbamol (ROBAXIN) 750 MG tablet Take 1 tablet (750 mg total) by mouth 4 (four) times daily as needed (use for muscle cramps/pain).  . metoprolol tartrate (LOPRESSOR) 25 MG tablet TAKE ONE TABLET BY MOUTH TWICE A DAY  . mycophenolate (CELLCEPT) 500 MG tablet Take 2 tablets by mouth twice a day  . OFEV 100 MG CAPS TAKE 1 CAPSULE TWICE A DAY  . Omega-3 Fatty Acids (FISH OIL) 500 MG CAPS Take 1 capsule by mouth 2 (two) times daily.  . ondansetron (ZOFRAN) 4 MG tablet Take 4 mg by mouth every 8 (eight) hours as needed for nausea or vomiting.  Marland Kitchen PREDNISONE PO Take 7 mg by mouth daily.  . Probiotic Product (PROBIOTIC DAILY PO) Take 1 tablet by mouth daily.  Marland Kitchen sulfamethoxazole-trimethoprim (BACTRIM DS) 800-160 MG tablet TAKE ONE TABLET BY MOUTH TWICE A DAY ON MONDAY, WEDNESDAY, AND FRIDAY  . tamsulosin (FLOMAX) 0.4 MG CAPS capsule Take 0.4 mg by mouth 2 (two) times daily.   . Tiotropium Bromide-Olodaterol (STIOLTO RESPIMAT) 2.5-2.5 MCG/ACT AERS Inhale 2 puffs into the lungs daily.  . [DISCONTINUED] Bempedoic  Acid-Ezetimibe (NEXLIZET) 180-10 MG TABS Take 1 mg by mouth  daily.   No facility-administered encounter medications on file as of 08/04/2019.     Review of Systems  Review of Systems  Constitutional: Positive for activity change and fatigue. Negative for chills, fever and unexpected weight change.  HENT: Negative for postnasal drip, rhinorrhea, sinus pressure, sinus pain and sore throat.   Eyes: Negative.   Respiratory: Positive for shortness of breath. Negative for cough and wheezing.   Cardiovascular: Positive for leg swelling. Negative for chest pain and palpitations.  Gastrointestinal: Negative for constipation, diarrhea, nausea and vomiting.  Endocrine: Negative.   Genitourinary: Negative.   Musculoskeletal: Negative.   Skin: Negative.   Neurological: Negative for dizziness and headaches.  Psychiatric/Behavioral: Negative.  Negative for dysphoric mood. The patient is not nervous/anxious.   All other systems reviewed and are negative.    Physical Exam  BP 118/70 (BP Location: Left Arm, Patient Position: Sitting, Cuff Size: Normal)   Pulse 82   Temp (!) 97.4 F (36.3 C) (Temporal)   Ht 5\' 8"  (1.727 m)   Wt 180 lb (81.6 kg)   SpO2 95% Comment: on RA  BMI 27.37 kg/m   Wt Readings from Last 5 Encounters:  08/04/19 180 lb (81.6 kg)  07/21/19 188 lb 9.6 oz (85.5 kg)  05/26/19 186 lb (84.4 kg)  05/05/19 183 lb (83 kg)  04/16/19 180 lb 6.4 oz (81.8 kg)    BMI Readings from Last 5 Encounters:  08/04/19 27.37 kg/m  07/21/19 28.68 kg/m  05/26/19 27.47 kg/m  05/05/19 27.02 kg/m  04/16/19 26.64 kg/m     Physical Exam Vitals and nursing note reviewed.  Constitutional:      General: He is not in acute distress.    Appearance: Normal appearance. He is obese.  HENT:     Head: Normocephalic and atraumatic.     Right Ear: Hearing, tympanic membrane, ear canal and external ear normal. There is no impacted cerumen.     Left Ear: Hearing, tympanic membrane, ear canal and external ear normal. There is no impacted cerumen.      Nose: Nose normal. No mucosal edema or rhinorrhea.     Right Turbinates: Not enlarged.     Left Turbinates: Not enlarged.     Mouth/Throat:     Mouth: Mucous membranes are dry.     Pharynx: Oropharynx is clear. No oropharyngeal exudate.  Eyes:     Pupils: Pupils are equal, round, and reactive to light.  Cardiovascular:     Rate and Rhythm: Normal rate and regular rhythm.     Pulses: Normal pulses.     Heart sounds: Normal heart sounds. No murmur.  Pulmonary:     Effort: Pulmonary effort is normal.     Breath sounds: Normal breath sounds. No decreased breath sounds, wheezing or rales.  Musculoskeletal:     Cervical back: Normal range of motion.     Right lower leg: No edema.     Left lower leg: Edema (trace) present.  Lymphadenopathy:     Cervical: No cervical adenopathy.  Skin:    General: Skin is warm and dry.     Capillary Refill: Capillary refill takes less than 2 seconds.     Findings: No erythema or rash.  Neurological:     General: No focal deficit present.     Mental Status: He is alert and oriented to person, place, and time.     Motor: No weakness.     Coordination:  Coordination normal.     Gait: Gait is intact. Gait (tolerated walk in office) normal.  Psychiatric:        Mood and Affect: Mood normal.        Behavior: Behavior normal. Behavior is cooperative.        Thought Content: Thought content normal.        Judgment: Judgment normal.       Assessment & Plan:    ILD (interstitial lung disease) ([place]) Walk today in office stable Chest x-ray today clear  Plan: Trial of Stiolto Respimat Lab work today Keep close follow-up with our office in 4 weeks Continue CellCept Continue Bactrim Monday Wednesday Friday Continue Ofev 100 mg taken twice daily until you are able to obtain 150 mg tablet, then increase Ofev to 150 mg twice daily  Shortness of breath 2 long episodes of shortness of breath over the last 24 hours Potential acute on chronic flare of  ILD Chest x-ray today clear Wells criteria score for PE 0 Patient maintained on Eliquis Walk today in office, no oxygen desaturations Trace lower extremity swelling left lower leg Last echocardiogram 2017  Plan: Lab work today If BNP elevated consider short trial of diuretic, can also consider repeating echocardiogram Also may need to consider right heart cath based off of echocardiogram results Trial of Stiolto Respimat Stop Symbicort 80   Discussed case with Dr. Carlis Abbott.     Return in about 23 days (around 08/27/2019), or if symptoms worsen or fail to improve, for Follow up with Dr. Carlis Abbott.   Lauraine Rinne, NP 08/04/2019   This appointment required 42 minutes of patient care (this includes precharting, chart review, review of results, face-to-face care, etc.).

## 2019-08-04 NOTE — Telephone Encounter (Signed)
Called and spoke with pt letting him know that Dr. Carlis Abbott said to come get cxr and also to have an acute visit as well. Pt has been scheduled for appt with Aaron Edelman at 3pm with cxr prior. Order placed for cxr. Routing to [place] as an Pharmacist, hospital.

## 2019-08-05 ENCOUNTER — Ambulatory Visit (HOSPITAL_COMMUNITY): Payer: PPO

## 2019-08-05 DIAGNOSIS — R0602 Shortness of breath: Secondary | ICD-10-CM | POA: Diagnosis not present

## 2019-08-05 DIAGNOSIS — M25519 Pain in unspecified shoulder: Secondary | ICD-10-CM | POA: Diagnosis not present

## 2019-08-05 DIAGNOSIS — Z7952 Long term (current) use of systemic steroids: Secondary | ICD-10-CM | POA: Diagnosis not present

## 2019-08-05 DIAGNOSIS — M25511 Pain in right shoulder: Secondary | ICD-10-CM | POA: Diagnosis not present

## 2019-08-05 DIAGNOSIS — G4730 Sleep apnea, unspecified: Secondary | ICD-10-CM | POA: Diagnosis not present

## 2019-08-05 DIAGNOSIS — E782 Mixed hyperlipidemia: Secondary | ICD-10-CM | POA: Diagnosis not present

## 2019-08-05 DIAGNOSIS — D692 Other nonthrombocytopenic purpura: Secondary | ICD-10-CM | POA: Diagnosis not present

## 2019-08-05 DIAGNOSIS — M545 Low back pain: Secondary | ICD-10-CM | POA: Diagnosis not present

## 2019-08-05 DIAGNOSIS — M542 Cervicalgia: Secondary | ICD-10-CM | POA: Diagnosis not present

## 2019-08-05 DIAGNOSIS — Z0001 Encounter for general adult medical examination with abnormal findings: Secondary | ICD-10-CM | POA: Diagnosis not present

## 2019-08-05 DIAGNOSIS — M8080XD Other osteoporosis with current pathological fracture, unspecified site, subsequent encounter for fracture with routine healing: Secondary | ICD-10-CM | POA: Diagnosis not present

## 2019-08-05 DIAGNOSIS — K5780 Diverticulitis of intestine, part unspecified, with perforation and abscess without bleeding: Secondary | ICD-10-CM | POA: Diagnosis not present

## 2019-08-05 DIAGNOSIS — E663 Overweight: Secondary | ICD-10-CM | POA: Diagnosis not present

## 2019-08-05 DIAGNOSIS — R768 Other specified abnormal immunological findings in serum: Secondary | ICD-10-CM | POA: Diagnosis not present

## 2019-08-05 DIAGNOSIS — E0965 Drug or chemical induced diabetes mellitus with hyperglycemia: Secondary | ICD-10-CM | POA: Diagnosis not present

## 2019-08-05 DIAGNOSIS — I739 Peripheral vascular disease, unspecified: Secondary | ICD-10-CM | POA: Diagnosis not present

## 2019-08-05 DIAGNOSIS — M353 Polymyalgia rheumatica: Secondary | ICD-10-CM | POA: Diagnosis not present

## 2019-08-05 DIAGNOSIS — S32010D Wedge compression fracture of first lumbar vertebra, subsequent encounter for fracture with routine healing: Secondary | ICD-10-CM | POA: Diagnosis not present

## 2019-08-05 DIAGNOSIS — I2510 Atherosclerotic heart disease of native coronary artery without angina pectoris: Secondary | ICD-10-CM | POA: Diagnosis not present

## 2019-08-05 DIAGNOSIS — J849 Interstitial pulmonary disease, unspecified: Secondary | ICD-10-CM | POA: Diagnosis not present

## 2019-08-05 DIAGNOSIS — Z48815 Encounter for surgical aftercare following surgery on the digestive system: Secondary | ICD-10-CM | POA: Diagnosis not present

## 2019-08-05 DIAGNOSIS — I482 Chronic atrial fibrillation, unspecified: Secondary | ICD-10-CM | POA: Diagnosis not present

## 2019-08-05 DIAGNOSIS — F5101 Primary insomnia: Secondary | ICD-10-CM | POA: Diagnosis not present

## 2019-08-05 DIAGNOSIS — I10 Essential (primary) hypertension: Secondary | ICD-10-CM | POA: Diagnosis not present

## 2019-08-05 DIAGNOSIS — Z6826 Body mass index (BMI) 26.0-26.9, adult: Secondary | ICD-10-CM | POA: Diagnosis not present

## 2019-08-05 DIAGNOSIS — J84112 Idiopathic pulmonary fibrosis: Secondary | ICD-10-CM | POA: Diagnosis not present

## 2019-08-05 LAB — BRAIN NATRIURETIC PEPTIDE: Pro B Natriuretic peptide (BNP): 68 pg/mL (ref 0.0–100.0)

## 2019-08-06 MED ORDER — OFEV 150 MG PO CAPS: 150.0000 mg | ORAL_CAPSULE | Freq: Two times a day (BID) | ORAL | 11 refills | Status: DC

## 2019-08-06 NOTE — Addendum Note (Signed)
Addended by: Collier Salina on: 08/06/2019 03:16 PM   Modules accepted: Orders

## 2019-08-06 NOTE — Telephone Encounter (Addendum)
Called and spoke to pt. Pt states he is still taking the 100mg  tabs BID. Pt states he receives his Ofev from [place]. Called [facility] at [phone] and spoke with pharmacist and changed his current script from 100mg  tabs to 150mg  tabs, BID #60 with 11 refills. Pt aware. Pt has appt for April with Dr. Carlis Abbott. Nothing further needed at this time.

## 2019-08-11 ENCOUNTER — Telehealth: Payer: Self-pay | Admitting: Pharmacy Technician

## 2019-08-11 NOTE — Telephone Encounter (Signed)
Received non-formulary Prior Auth request for Ofev. Submitted a Prior Authorization request to [facility] for [place] via Cover My Meds. Will update once we receive a response.  KeyBurna [id number] - [id number]

## 2019-08-12 DIAGNOSIS — M8080XD Other osteoporosis with current pathological fracture, unspecified site, subsequent encounter for fracture with routine healing: Secondary | ICD-10-CM | POA: Diagnosis not present

## 2019-08-12 NOTE — Telephone Encounter (Signed)
Received notification from [facility] regarding a prior authorization for [place]. Authorization has been APPROVED from 08/11/19 to 08/10/20.   Authorization # [id number]  9:24 AM Beatriz Chancellor, CPhT

## 2019-08-13 DIAGNOSIS — M5126 Other intervertebral disc displacement, lumbar region: Secondary | ICD-10-CM | POA: Diagnosis not present

## 2019-08-13 DIAGNOSIS — M5127 Other intervertebral disc displacement, lumbosacral region: Secondary | ICD-10-CM | POA: Diagnosis not present

## 2019-08-20 ENCOUNTER — Ambulatory Visit: Payer: PPO | Admitting: Critical Care Medicine

## 2019-08-20 ENCOUNTER — Telehealth: Payer: Self-pay | Admitting: Critical Care Medicine

## 2019-08-20 NOTE — Telephone Encounter (Signed)
Yes, please refill his bactrim for once daily use on MWF.  I am glad he is feeling better, and if he needs more nausea medicine, please let me know. Thanks!  Julian Hy, DO 08/20/19 6:06 PM [facility]

## 2019-08-20 NOTE — Telephone Encounter (Signed)
Called and spoke to patient , he was taking the nausea medication and imodium but still felt sick.   But he said he would go back on it , in 2 weeks.   Patient also states he needs a refill of his bactrim to [facility] for a 90 day supply  Dr. Carlis Abbott please advise on refill

## 2019-08-20 NOTE — Telephone Encounter (Signed)
Thanks for the update. I agree, no dose increase.  Try to stay off of it for about 2 weeks. At that time we can try restarting it again once daily if he wants. I can also prescribe nausea medicine if he wants to try that when he restarts it. He can get imodium or other diarrhea meds OTC if he wants.

## 2019-08-20 NOTE — Telephone Encounter (Signed)
Called spoke with patient.  He states he has been having nausea and diarrhea for 2weeks. He stopped his Ofev Saturday or Sunday (4/3-4/4) and now patient feels normal.  He is not having any symptoms. Patient states Dr. Carlis Abbott wanted to up his dose of OFEV but he can't handle the low dose.   Dr. Carlis Abbott please advise

## 2019-08-21 DIAGNOSIS — S32010G Wedge compression fracture of first lumbar vertebra, subsequent encounter for fracture with delayed healing: Secondary | ICD-10-CM | POA: Diagnosis not present

## 2019-08-21 DIAGNOSIS — M5416 Radiculopathy, lumbar region: Secondary | ICD-10-CM | POA: Diagnosis not present

## 2019-08-21 MED ORDER — SULFAMETHOXAZOLE-TRIMETHOPRIM 800-160 MG PO TABS: ORAL_TABLET | ORAL | 3 refills | Status: DC

## 2019-08-21 NOTE — Telephone Encounter (Signed)
I refilled the bactrim for 90 day supply  Spoke with the pt and notified of Dr Ainsley Spinner recs and he verbalized understanding

## 2019-08-24 ENCOUNTER — Telehealth: Payer: Self-pay

## 2019-08-24 NOTE — Telephone Encounter (Signed)
Patient with diagnosis of afib on Eliquis for anticoagulation.    Procedure:  L1-2 Laminectomy, Decompression with T12-L1, L1-2 posterior interbody fusion, posterolateral arthrodesis with fixation from T10 to L2  Date of procedure: TBD  CHADS2-VASc score of  3 (HTN, AGE, CAD)  CrCl 73 ml/min  Per office protocol, patient can hold Eliquis for 3 days prior to procedure.

## 2019-08-24 NOTE — Telephone Encounter (Signed)
   Primary Cardiologist: Shelva Majestic, MD  Chart reviewed as part of pre-operative protocol coverage.   Per pharmacy recommendations, patient can hold eliquis 3 days prior to his upcoming spinal procedure and should restart when cleared to do so by Dr. Ellene Route.  I will route this recommendation to the requesting party via Epic fax function and remove from pre-op pool.  Please call with questions.  Abigail Butts, PA-C 08/24/2019, 5:34 PM

## 2019-08-24 NOTE — Telephone Encounter (Addendum)
   [facility] Pre-operative Risk Assessment    Request for surgical clearance:  1. What type of surgery is being performed? L1-2 Laminectomy, Decompression with T12-L1, L1-2 posterior interbody fusion, posterolateral arthrodesis with fixation from T10 to L2   2. When is this surgery scheduled? TBD   3. What type of clearance is required (medical clearance vs. Pharmacy clearance to hold med vs. Both)? Pharmacy clearance  4. Are there any medications that need to be held prior to surgery and how long? Eliquis,  Not listed  5. Practice name and name of physician performing surgery? [place], [name]  6. What is your office phone number? [phone]    7.   What is your office fax number? [phone]  8.   Anesthesia type (None, local, MAC, general) ? Not listed   [name] 08/24/2019, 4:30 PM  _________________________________________________________________   (provider comments below)

## 2019-08-27 ENCOUNTER — Encounter: Payer: Self-pay | Admitting: Critical Care Medicine

## 2019-08-27 ENCOUNTER — Ambulatory Visit: Payer: PPO | Admitting: Critical Care Medicine

## 2019-08-27 ENCOUNTER — Other Ambulatory Visit: Payer: Self-pay

## 2019-08-27 VITALS — BP 130/70 | HR 82 | Temp 97.8°F | Ht 68.0 in | Wt 189.6 lb

## 2019-08-27 DIAGNOSIS — R609 Edema, unspecified: Secondary | ICD-10-CM | POA: Diagnosis not present

## 2019-08-27 MED ORDER — STIOLTO RESPIMAT 2.5-2.5 MCG/ACT IN AERS: 2.0000 | INHALATION_SPRAY | Freq: Every day | RESPIRATORY_TRACT | 11 refills | Status: DC

## 2019-08-27 MED ORDER — NYSTATIN 100000 UNIT/ML MT SUSP: 5.0000 mL | OROMUCOSAL | 11 refills | Status: DC

## 2019-08-27 MED ORDER — SULFAMETHOXAZOLE-TRIMETHOPRIM 400-80 MG PO TABS: 2.0000 | ORAL_TABLET | ORAL | 3 refills | Status: DC

## 2019-08-27 NOTE — Progress Notes (Signed)
Synopsis: Referred in November 2017 for ILD by [name], MD.  Previously patient of [name].  Subjective:   [name] GENDER: male DOB: 1945/08/28, MRN: [id number]  Chief Complaint  Patient presents with  . Follow-up    Patient has quit taking the OFEV and has felt a lot better since then. Patient was taking Stiolto but got thrush and took some Nystatin and it resolved it. Patient needs back surgery in about 3-4 months.      [name] is a 74 year old gentleman with a history of IPF who presents for follow-up.  He is accompanied by his wife today.  He recently had a severe shortness of breath episode that was precipitated by being around a significant amount of sawdust.  He did well with Stiolto, but developed thrush, which has been an issue for him with inhalers in the past.  He is improved with nystatin.  He stopped his Ofev due to severe intractable nausea and vomiting.  He now feels much better and is back to normal.  His shortness of breath is persistent-he has trouble walking around the house.  He has continued on his Bactrim and CellCept plus prednisone for polymyalgia rheumatica.  He wants to know if he can stop his Eliquis to be able to take an NSAID for help with his joint pain.  He has new leg edema bilaterally, but no edema elsewhere.  No chest pain.     OV 07/21/19: [name] is a 74 year old gentleman with a history of ILD- UIP vs NSIP on chronic Ofev, mycophenolate who presents for follow-up.  He required disruption of these medications for several months in late 2020 his colostomy reversal.  When the meds were restarted postoperatively he had persistent bleeding, leading to prolonged time off medications and restarting them sequentially.  He feels that his breathing has gotten worse over time since then, but is unsure if that was all due to being off the meds or if it is continued to decline since then.  His breathing  is usually worse during warmer weather.  He has noticed that his activities are more limited than they used to be.  He recently had an outpatient back surgery where he had to have bone fragments removed that were pinching and nerves.  He was developing left lower extremity weakness preoperatively.  Due to ongoing nerve impingement that is presumed to be due to inflammation, he is currently on a prednisone taper.  Currently on 20 mg daily and will decrease to 15 mg daily tomorrow.  He is on chronic prednisone for polymyalgia rheumatica at 7 mg daily.  He has been on Ofev 100 mg twice daily.  He was initially started on 150 mg twice daily, but required decrease in his dose due to nausea, vomiting, diarrhea.  Over time the symptoms have improved.  He restarted his meds postoperatively in the fall 2020 he had symptoms short-term, but noticed improvement over time.  When he had GI symptoms at the higher dose, he had some improvement with Zofran and Phenergan.  He is currently not having blood in his stools or other issues related to surgery.  He has recently received his 2 doses of the vagina vaccine.  He and his wife are planning a cross-country trip with their RV to visit children and grandchildren later this spring where they will be gone for 2 months.  Reviewed rheumatology, Dr. Melissa Noon clinic note 06/15/2019.  Considering starting Plaquenil to reduce dose of  prednisone.    OV 04/16/2019: [name] is a 74 year old gentleman who presents for follow-up of NSIP.  He has had worse symptoms since interrupting his CellCept and nintedanib for his colectomy reversal.  He had bleeding when he initially restarted these medicines a few weeks postop, which required stopping them again.  He restarted his nintedanib last week and CellCept with Bactrim about 2 weeks prior.  He has not had any additional bleeding.  No abdominal pain.  He feels better than he did before surgery.  He still has mild dyspnea, not worse than  his last visit.  He notices it most when he is leaning over.  No other complaints.  His PCP did his 73-monthlab work earlier this week, and he will call the office to request they send [place].   OV 03/19/2019: Mr. RKrejciis a 74year old gentleman with history of ILD to NSIP and polymyalgia rheumatica on chronic immunosuppression who presents for follow-up.  He underwent colostomy reversal on 01/26/2019, for which his CellCept plus Bactrim and nintedanib were held due to concern for potential surgical complications.  Several weeks following surgery his medications were restarted, and within a day or 2 he developed rectal bleeding.  This continued for several days until he notified [place] and we instructed him to stop.  Within the next day or 2 the bleeding has stopped.  He notices that his breathing has started to get a little bit worse over the last few weeks, and he would like to restart his medications.  He feels like he cannot exert himself due to his mild dyspnea.  He denies abdominal pain, fevers, nausea, vomiting, diarrhea, constipation, or periodic rectal bleeding.  He is following up with Dr. TMarlou Starkssurgery tomorrow.  He had had a colonoscopy just prior to surgery, but is unsure if he had hemorrhoids.  He follows with Dr. BAmil Amenin rheumatology.  His prednisone has been tapered down to 7 mg, which will be his dose for now.  There has been mention of possibly starting Plaquenil in the future for his polymyalgia rheumatica.  OV 01/21/19: Mr. RRamsayis a 74year old gentleman with a history of fibrosing NSIP diagnosed on open lung biopsy in 2019 who presents for routine follow-up and preoperative evaluation before colostomy reversal next week.  He has a history of polymyalgia rheumatica for which she has been on chronic steroids.  He is followed by Dr. BAmil Amenfrom rheumatology.  Currently he is being titrated off his chronic prednisone, currently on 8 mg daily.  For his NSIP he is maintained on  CellCept, nintedanib, and Bactrim 3 days a week.  He is doing well, better than last year from breathlessness standpoint.  He went hiking with his wife a few weeks ago, which he reports he would have been unable to do a year ago.  He has nausea and diarrhea associated with nintedanib, but he is able to tolerate these with the use of Zofran and Imodium.  No jaundice, itching, right upper quadrant pain.  In February 2020 he underwent emergency partial colectomy with diverting colostomy for perforated diverticulitis.  He is planning to undergo colostomy reversal next week.  In July he fell, causing an L1 compression fracture.  Dr. BAmil Amenis planning to do a bone mineral density study, and currently he is not on calcium and vitamin D supplements.  In a few months he is planning on having a small outpatient back surgery to address a bone spur that is causing radicular pain in  his left leg.  Today he wants to discuss plans for his pulmonary meds perioperatively.  He has previously had symptomatic renal insufficiency when he tried to come off prednisone on his own too quickly.  During his VATS and colectomy he received 10 mg of dexamethasone in the OR, and he received low-dose Solu-Medrol postoperatively from his colectomy.  Flowsheet data:  Lab Results  Component Value Date   NITRICOXIDE 8 03/03/2018    Past Medical History:  Diagnosis Date  . Allergy to alpha-gal    2015; has been able to re-introduce red meat for the past 2 1/2 years without reaction (as of 01/21/19)  . Arthritis   . Chest pain    a. 03/2017: echo showing EF of 60-65%, no regional WMA or significant valve abnormalities. b. 03/2016: NST with no evidence of ischemia.   . Complication of anesthesia    "anaphylactic" reaction after given xylocaine for shoulder injection > 10 years ago; reported no known reaction when given marcaine (as of 01/21/19)   . Digestive problems    on Prednisone prn for this issue- diarrhea  . Dyspnea    with  activity  . Dysrhythmia    irregular due to Myocarditis- takes Atenolol, Norvasc  . GERD (gastroesophageal reflux disease)   . H/O viral myocarditis    25 years ago  . Head injury, closed, with concussion    Breif LOC  . Hyperlipidemia   . Mild mitral valve prolapse    per Dr Evette Georges notes  . PAF (paroxysmal atrial fibrillation) ([place])    a. initially occuring in 02/2016. b. recurrent in 07/2016. Placed on Eliquis  . Polymyalgia rheumatica ([place])   . Pre-diabetes   . Pulmonary fibrosis ([place])   . Sleep apnea    mild sleep apnea   no CPAP     Family History  Problem Relation Age of Onset  . Rheum arthritis Mother   . Thyroid cancer Mother   . Heart disease Father   . Asthma Father   . Thyroid cancer Sister   . Melanoma Brother      Past Surgical History:  Procedure Laterality Date  . apendectomy  1965  . APPENDECTOMY    . bone spur Bilateral 1999   feet  . CARDIAC CATHETERIZATION  2001  . CHOLECYSTECTOMY  2004  . COLON RESECTION N/A 06/15/2018   Procedure: SIGMOID COLON RESECTION;  Surgeon: [name], MD;  Location: [facility];  Service: General;  Laterality: N/A;  . COLONOSCOPY    . COLOSTOMY N/A 06/15/2018   Procedure: COLOSTOMY;  Surgeon: [name], MD;  Location: [facility];  Service: General;  Laterality: N/A;  . COLOSTOMY TAKEDOWN N/A 01/26/2019   Procedure: LAPAROSCOPIC ASSISTED COLOSTOMY TAKEDOWN;  Surgeon: [name], MD;  Location: [facility];  Service: General;  Laterality: N/A;  . EYE SURGERY Bilateral    cataract removal  . LAPAROSCOPIC LYSIS OF ADHESIONS N/A 01/26/2019   Procedure: LAPAROSCOPIC LYSIS OF ADHESIONS;  Surgeon: [name], MD;  Location: [facility];  Service: General;  Laterality: N/A;  . LAPAROTOMY N/A 06/15/2018   Procedure: EXPLORATORY LAPAROTOMY;  Surgeon: [name], MD;  Location: [facility];  Service: General;  Laterality: N/A;  . LUMBAR LAMINECTOMY/ DECOMPRESSION WITH MET-RX Right 05/05/2013   Procedure: Right Lumbar three-four Extraforaminal  Microdiskectomy with Metrex;  Surgeon: [name], MD;  Location: [facility];  Service: Neurosurgery;  Laterality: Right;  Right Lumbar three-four Extraforaminal Microdiskectomy with Metrex  . LUNG BIOPSY Right  02/19/2018   Procedure: LUNG BIOPSY;  Surgeon: [name], MD;  Location: [facility];  Service: Thoracic;  Laterality: Right;  . SHOULDER OPEN ROTATOR CUFF REPAIR Bilateral 2001  . TONSILLECTOMY    . VIDEO ASSISTED THORACOSCOPY Right 02/19/2018   Procedure: VIDEO ASSISTED THORACOSCOPY;  Surgeon: [name], MD;  Location: [facility];  Service: Thoracic;  Laterality: Right;    Social History   Socioeconomic History  . Marital status: Married    Spouse name: Not on file  . Number of children: Not on file  . Years of education: Not on file  . Highest education level: Not on file  Occupational History  . Not on file  Tobacco Use  . Smoking status: Never Smoker  . Smokeless tobacco: Never Used  Substance and Sexual Activity  . Alcohol use: Yes    Alcohol/week: 21.0 standard drinks    Types: 21 Glasses of wine per week    Comment: 2/3 glasses wine in evening  . Drug use: No  . Sexual activity: Yes    Birth control/protection: Other-see comments    Comment: old age  Other Topics Concern  . Not on file  Social History Narrative  . Not on file   Social Determinants of Health   Financial Resource Strain:   . Difficulty of Paying Living Expenses:   Food Insecurity:   . Worried About Charity fundraiser in the Last Year:   . Arboriculturist in the Last Year:   Transportation Needs:   . Film/video editor (Medical):   Marland Kitchen Lack of Transportation (Non-Medical):   Physical Activity:   . Days of Exercise per Week:   . Minutes of Exercise per Session:   Stress:   . Feeling of Stress :   Social Connections:   . Frequency of Communication with Friends and Family:   . Frequency of Social Gatherings with Friends and Family:   . Attends Religious Services:   .  Active Member of Clubs or Organizations:   . Attends Archivist Meetings:   Marland Kitchen Marital Status:   Intimate Partner Violence:   . Fear of Current or Ex-Partner:   . Emotionally Abused:   Marland Kitchen Physically Abused:   . Sexually Abused:      Allergies  Allergen Reactions  . Lidocaine Hcl Anaphylaxis and Other (See Comments)    Xylocaine  . Xylocaine [Lidocaine] Anaphylaxis  . Codeine Nausea And Vomiting  . Crestor [Rosuvastatin Calcium] Other (See Comments)    All-over body aches Myalgias   . Statins Other (See Comments)    Muscle soreness and an aching feeling all over Myalgias  . Percocet [Oxycodone-Acetaminophen] Itching     Immunization History  Administered Date(s) Administered  . Influenza Split 03/20/2016  . Influenza, High Dose Seasonal PF 01/29/2018, 02/25/2019, 03/20/2019  . Influenza,inj,Quad PF,6+ Mos 03/12/2017  . Moderna SARS-COVID-2 Vaccination 06/04/2019, 07/10/2019  . Pneumococcal Conjugate-13 01/10/2018    Outpatient Medications Prior to Visit  Medication Sig Dispense Refill  . acetaminophen (TYLENOL) 500 MG tablet Take 1,000 mg by mouth every 6 (six) hours as needed for mild pain or headache.     . albuterol (VENTOLIN HFA) 108 (90 Base) MCG/ACT inhaler Inhale 2 puffs into the lungs every 6 (six) hours as needed. 18 g 11  . budesonide-formoterol (SYMBICORT) 80-4.5 MCG/ACT inhaler Inhale 2 puffs into the lungs 2 (two) times daily. (Patient taking differently: Inhale 2 puffs into the lungs daily. ) 1 Inhaler 12  .  CARTIA XT 120 MG 24 hr capsule TAKE ONE CAPSULE BY MOUTH DAILY 30 capsule 6  . cetirizine (ZYRTEC) 10 MG tablet Take 10 mg by mouth daily as needed for allergies.     . Cholecalciferol (NATURAL VITAMIN D-3 PO) Take 6,000 Units by mouth daily.    . diphenoxylate-atropine (LOMOTIL) 2.5-0.025 MG tablet Take 1 tablet by mouth 4 (four) times daily as needed for diarrhea or loose stools.    Marland Kitchen ELIQUIS 5 MG TABS tablet TAKE ONE TABLET (5MG TOTAL) BY MOUTH  TWODAILY (Patient taking differently: Take 5 mg by mouth 2 (two) times daily. ) 180 tablet 1  . fluticasone (FLONASE) 50 MCG/ACT nasal spray Place 2 sprays into both nostrils daily as needed for allergies or rhinitis.    Marland Kitchen gabapentin (NEURONTIN) 300 MG capsule Take 300 mg by mouth 2 (two) times daily.     . methocarbamol (ROBAXIN) 750 MG tablet Take 1 tablet (750 mg total) by mouth 4 (four) times daily as needed (use for muscle cramps/pain). 20 tablet 0  . metoprolol tartrate (LOPRESSOR) 25 MG tablet TAKE ONE TABLET BY MOUTH TWICE A DAY 180 tablet 3  . mycophenolate (CELLCEPT) 500 MG tablet Take 2 tablets by mouth twice a day 120 tablet 3  . Omega-3 Fatty Acids (FISH OIL) 500 MG CAPS Take 1 capsule by mouth 2 (two) times daily.    . ondansetron (ZOFRAN) 4 MG tablet Take 4 mg by mouth every 8 (eight) hours as needed for nausea or vomiting.    Marland Kitchen PREDNISONE PO Take 7 mg by mouth daily.    . Probiotic Product (PROBIOTIC DAILY PO) Take 1 tablet by mouth daily.    Marland Kitchen sulfamethoxazole-trimethoprim (BACTRIM DS) 800-160 MG tablet 1 Monday Wednesday Friday 36 tablet 3  . tamsulosin (FLOMAX) 0.4 MG CAPS capsule Take 0.4 mg by mouth 2 (two) times daily.     . Nintedanib (OFEV) 150 MG CAPS Take 1 capsule (150 mg total) by mouth 2 (two) times daily. (Patient not taking: Reported on 08/27/2019) 60 capsule 11  . Tiotropium Bromide-Olodaterol (STIOLTO RESPIMAT) 2.5-2.5 MCG/ACT AERS Inhale 2 puffs into the lungs daily. (Patient not taking: Reported on 08/27/2019) 8 g 0   No facility-administered medications prior to visit.    Review of Systems  Constitutional: Negative for chills, fever and weight loss.  HENT: Negative.   Eyes: Negative.   Respiratory: Negative for cough.        Progressively developing SOB  Cardiovascular: Negative for chest pain and leg swelling.  Gastrointestinal: Negative for abdominal pain, constipation, nausea and vomiting.  Musculoskeletal: Negative.   Skin: Negative.   Neurological:  Negative.      Objective:   Vitals:   08/27/19 1515 08/27/19 1516  BP:  130/70  Pulse: 82   Temp:  97.8 F (36.6 C)  TempSrc:  Temporal  SpO2: 97%   Weight:  189 lb 9.6 oz (86 kg)  Height:  '5\' 8"'  (1.727 m)   97% on RA BMI Readings from Last 3 Encounters:  08/27/19 28.83 kg/m  08/04/19 27.37 kg/m  07/21/19 28.68 kg/m   Wt Readings from Last 3 Encounters:  08/27/19 189 lb 9.6 oz (86 kg)  08/04/19 180 lb (81.6 kg)  07/21/19 188 lb 9.6 oz (85.5 kg)    Physical Exam Vitals reviewed.  Constitutional:      Appearance: Normal appearance. He is not ill-appearing.  HENT:     Head: Normocephalic and atraumatic.  Eyes:     General: No scleral icterus.  Cardiovascular:     Rate and Rhythm: Normal rate and regular rhythm.     Heart sounds: No murmur.  Pulmonary:     Comments: Bibasilar rales.  Clear anteriorly.  Breathing comfortably on room air, no conversational dyspnea. Abdominal:     General: There is no distension.     Palpations: Abdomen is soft.     Comments: Abdominal binder in place  Musculoskeletal:        General: No deformity.     Cervical back: Neck supple.     Comments: Symmetric bilateral lower extremity edema to upper shins  Lymphadenopathy:     Cervical: No cervical adenopathy.  Skin:    General: Skin is warm and dry.     Findings: No rash.  Neurological:     General: No focal deficit present.     Mental Status: He is alert.     Coordination: Coordination normal.  Psychiatric:        Mood and Affect: Mood normal.        Behavior: Behavior normal.      CBC    Component Value Date/Time   WBC 11.6 (H) 01/29/2019 0107   RBC 3.54 (L) 01/29/2019 0107   HGB 13.4 01/29/2019 0107   HCT 37.5 (L) 01/29/2019 0107   PLT 180 01/29/2019 0107   MCV 105.9 (H) 01/29/2019 0107   MCH 37.9 (H) 01/29/2019 0107   MCHC 35.7 01/29/2019 0107   RDW 13.7 01/29/2019 0107   LYMPHSABS 2.0 01/29/2019 0107   MONOABS 0.7 01/29/2019 0107   EOSABS 0.0 01/29/2019 0107     BASOSABS 0.0 01/29/2019 0107     Previous labs reviewed: November 2017 reviewed: CRP 139, total CK normal, antineutrophil cytoplasmic antibody testing negative  October 2019 alpha-1 antitrypsin M-M  Chest Imaging- films reviewed: CT chest 12/06/2016 - Groundglass opacities and interlobular septal thickening in the lower lobes. No honeycombing. No adenopathy or pleural disease.     CT abdomen pelvis 07/01/2018-  Scars in the RLL and RML from previous biopsy. Persistent GGO and interlobular septal thickening. Does not appear to have progressive fibrosis compared to previous CT scan.  Pulmonary Functions Testing Results: PFT Results Latest Ref Rng & Units 07/15/2019 01/10/2018 10/26/2016 05/03/2016  FVC-Pre L 3.58 3.52 4.21 3.55  FVC-Predicted Pre % 88 82 96 80  FVC-Post L 3.60 3.53 4.08 3.65  FVC-Predicted Post % 88 83 93 83  Pre FEV1/FVC % % 76 81 78 81  Post FEV1/FCV % % 79 82 81 83  FEV1-Pre L 2.72 2.85 3.30 2.88  FEV1-Predicted Pre % 91 91 102 88  FEV1-Post L 2.85 2.88 3.30 3.02  DLCO UNC% % 80 68 84 75  DLCO COR %Predicted % 90 81 87 91  TLC L 5.86 7.25 6.79 5.85  TLC % Predicted % 88 106 98 84  RV % Predicted % 94 155 102 94   2021: No significant obstruction or restriction.  Normal diffusion capacity.  No significant response bronchodilators.  No longer air trapping (TLC reduced compared to previous, but FVC improved).   Pathology 03/01/2018: Biopsies from right upper lobe, right middle lobe, right lower lobe showing temporal and spatial variation in a pattern of fibrosing interstitial pneumonia.  Prominent fibroblastic foci numerous areas of osseous metaplasia.  No honeycombing.  Areas of spared alveoli.  Echocardiogram 03/22/2016: LVEF 60 to 65%, normal wall motion, grade 1 diastolic dysfunction.  Normal RV.  Normal left atrium.  Normal valves    Assessment & Plan:  ICD-10-CM   1. Edema, unspecified type  R60.9 B Nat Peptide    ECHOCARDIOGRAM COMPLETE     UIP vs  fibrosing NSIP, on mycophenolate and previously on nintedanib. PFTs demonstrate significant reduction in TLC since 2019.  Recently stopped Ofev due to uncontrolled GI symptoms.   -Discussed my concern for progressive disease off nintedanib.  An alternative would be trying pirfenidone.  He is willing to consider this, but is worried about the 3 times daily dosing.  He is hoping to have spine surgery in August 2021, which would require his medications to be interrupted in July again.  He would like to try resuming Ofev 100 mg once daily to see if he can tolerate this.  If he is not able to we will initiate the paperwork required for pirfenidone.  If starting pirfenidone will need monthly LFT monitoring for the first few months. -Continue CellCept and Bactrim for now.  Have discussed with Dr. Chase Caller if it would be appropriate to stop CellCept.  He will be discussed at an upcoming ILD conference to review his pathology, imaging.  Leg edema -Echocardiogram -BNP  Chronic immunosuppression- prednisone & mycophenolate -Continue Bactrim 3 days/week -Appreciate Dr. Melissa Noon input  Chronic anticoagulation for A. fib prophylaxis -Discussed CHA2DS2-VASc risk of 1, associated with his 0.6% risk of stroke annually.  If he is interested in stopping quest to facilitate starting an NSAID, I would recommend using high-dose aspirin as his NSAID of choice to help mitigate his stroke risk.   RTC in 2 months.  He needs follow-up before surgery can potentially be planned to help risk stratify.   Current Outpatient Medications:  .  acetaminophen (TYLENOL) 500 MG tablet, Take 1,000 mg by mouth every 6 (six) hours as needed for mild pain or headache. , Disp: , Rfl:  .  albuterol (VENTOLIN HFA) 108 (90 Base) MCG/ACT inhaler, Inhale 2 puffs into the lungs every 6 (six) hours as needed., Disp: 18 g, Rfl: 11 .  budesonide-formoterol (SYMBICORT) 80-4.5 MCG/ACT inhaler, Inhale 2 puffs into the lungs 2 (two) times daily.  (Patient taking differently: Inhale 2 puffs into the lungs daily. ), Disp: 1 Inhaler, Rfl: 12 .  CARTIA XT 120 MG 24 hr capsule, TAKE ONE CAPSULE BY MOUTH DAILY, Disp: 30 capsule, Rfl: 6 .  cetirizine (ZYRTEC) 10 MG tablet, Take 10 mg by mouth daily as needed for allergies. , Disp: , Rfl:  .  Cholecalciferol (NATURAL VITAMIN D-3 PO), Take 6,000 Units by mouth daily., Disp: , Rfl:  .  diphenoxylate-atropine (LOMOTIL) 2.5-0.025 MG tablet, Take 1 tablet by mouth 4 (four) times daily as needed for diarrhea or loose stools., Disp: , Rfl:  .  ELIQUIS 5 MG TABS tablet, TAKE ONE TABLET (5MG TOTAL) BY MOUTH TWODAILY (Patient taking differently: Take 5 mg by mouth 2 (two) times daily. ), Disp: 180 tablet, Rfl: 1 .  fluticasone (FLONASE) 50 MCG/ACT nasal spray, Place 2 sprays into both nostrils daily as needed for allergies or rhinitis., Disp: , Rfl:  .  gabapentin (NEURONTIN) 300 MG capsule, Take 300 mg by mouth 2 (two) times daily. , Disp: , Rfl:  .  methocarbamol (ROBAXIN) 750 MG tablet, Take 1 tablet (750 mg total) by mouth 4 (four) times daily as needed (use for muscle cramps/pain)., Disp: 20 tablet, Rfl: 0 .  metoprolol tartrate (LOPRESSOR) 25 MG tablet, TAKE ONE TABLET BY MOUTH TWICE A DAY, Disp: 180 tablet, Rfl: 3 .  mycophenolate (CELLCEPT) 500 MG tablet, Take 2 tablets by mouth twice  a day, Disp: 120 tablet, Rfl: 3 .  Omega-3 Fatty Acids (FISH OIL) 500 MG CAPS, Take 1 capsule by mouth 2 (two) times daily., Disp: , Rfl:  .  ondansetron (ZOFRAN) 4 MG tablet, Take 4 mg by mouth every 8 (eight) hours as needed for nausea or vomiting., Disp: , Rfl:  .  PREDNISONE PO, Take 7 mg by mouth daily., Disp: , Rfl:  .  Probiotic Product (PROBIOTIC DAILY PO), Take 1 tablet by mouth daily., Disp: , Rfl:  .  sulfamethoxazole-trimethoprim (BACTRIM DS) 800-160 MG tablet, 1 Monday Wednesday Friday, Disp: 36 tablet, Rfl: 3 .  tamsulosin (FLOMAX) 0.4 MG CAPS capsule, Take 0.4 mg by mouth 2 (two) times daily. , Disp: , Rfl:   .  Nintedanib (OFEV) 150 MG CAPS, Take 1 capsule (150 mg total) by mouth 2 (two) times daily. (Patient not taking: Reported on 08/27/2019), Disp: 60 capsule, Rfl: 11 .  nystatin (MYCOSTATIN) 100000 UNIT/ML suspension, Take 5 mLs (500,000 Units total) by mouth every other day., Disp: 437 mL, Rfl: 11 .  Tiotropium Bromide-Olodaterol (STIOLTO RESPIMAT) 2.5-2.5 MCG/ACT AERS, Inhale 2 puffs into the lungs daily., Disp: 4 g, Rfl: 11   Julian Hy, DO [facility] 08/27/2019 3:58 PM

## 2019-08-27 NOTE — Patient Instructions (Addendum)
Thank you for visiting Dr. Carlis Abbott at [facility]. We recommend the following:  -It is ok to decrease Ofev to once per day.  -Your chance of a stroke based on your risk factors with afib is 0.6% per year. If you decided to stop your Eliquis to take an NSAID, I recommend full dose aspirin over naproxen.   Orders Placed This Encounter  Procedures  . B Nat Peptide  . ECHOCARDIOGRAM COMPLETE   Orders Placed This Encounter  Procedures  . B Nat Peptide    Standing Status:   Future    Standing Expiration Date:   08/26/2020  . ECHOCARDIOGRAM COMPLETE    Standing Status:   Future    Standing Expiration Date:   11/25/2020    Order Specific Question:   Where should this test be performed    Answer:   [facility]    Order Specific Question:   Does the patient weigh less than or greater than 250 lbs?    Answer:   Patient weighs less than 250 lbs    Order Specific Question:   Perflutren DEFINITY (image enhancing agent) should be administered unless hypersensitivity or allergy exist    Answer:   Administer Perflutren    Order Specific Question:   Reason for exam-Echo    Answer:   Other-Full Diagnosis List    Order Specific Question:   Full ICD-10/Reason for Exam    Answer:   Edema [782.3.ICD-9-CM]    Meds ordered this encounter  Medications  . Tiotropium Bromide-Olodaterol (STIOLTO RESPIMAT) 2.5-2.5 MCG/ACT AERS    Sig: Inhale 2 puffs into the lungs daily.    Dispense:  4 g    Refill:  11    Order Specific Question:   Lot Number?    Answer:   [id number]    Order Specific Question:   Expiration Date?    Answer:   05/13/2021  . nystatin (MYCOSTATIN) 100000 UNIT/ML suspension    Sig: Take 5 mLs (500,000 Units total) by mouth every other day.    Dispense:  437 mL    Refill:  11    Return in about 2 months (around 10/27/2019).    Please do your part to reduce the spread of COVID-19.

## 2019-08-28 LAB — BRAIN NATRIURETIC PEPTIDE: Pro B Natriuretic peptide (BNP): 62 pg/mL (ref 0.0–100.0)

## 2019-08-31 ENCOUNTER — Telehealth: Payer: Self-pay | Admitting: Critical Care Medicine

## 2019-08-31 MED ORDER — STIOLTO RESPIMAT 2.5-2.5 MCG/ACT IN AERS: 2.0000 | INHALATION_SPRAY | Freq: Every day | RESPIRATORY_TRACT | 6 refills | Status: DC

## 2019-08-31 NOTE — Telephone Encounter (Signed)
I called [name] nd advised them I would send in Rx for Stiolto. Rx sent and nothing further is needed.

## 2019-09-04 NOTE — Progress Notes (Signed)
Please let [name] know that his lab to look for signs of extra fluid from the heart is stable compared to when we last checked it. We still need to check the echocardiogram to ensure that his heart function is ok based on his symptoms recently. Thanks! LPC

## 2019-09-07 ENCOUNTER — Other Ambulatory Visit (HOSPITAL_COMMUNITY): Payer: PPO

## 2019-09-11 ENCOUNTER — Telehealth: Payer: Self-pay | Admitting: Critical Care Medicine

## 2019-09-11 NOTE — Telephone Encounter (Signed)
Medication name and strength: Stiolto 2.5 Provider: Carlis Abbott Pharmacy: [place] Patient insurance ID: [id number] Phone: [phone] Fax: [phone]  Was the PA started on CMM?  yes If yes, please enter the Key: [id number] Timeframe for approval/denial: [facility] has received your information, and the request will be reviewed. You may close this dialog, return to your dashboard, and perform other tasks.  You will receive an electronic determination in CoverMyMeds. You can see the latest determination by locating this request on your dashboard or by reopening this request. You will also receive a faxed copy of the determination. If you have any questions please contact [facility] at [phone].  If you need assistance, please chat with CoverMyMeds or call us at [phone].  Routing to [place] to follow up on.

## 2019-09-14 ENCOUNTER — Other Ambulatory Visit: Payer: Self-pay

## 2019-09-14 ENCOUNTER — Ambulatory Visit (HOSPITAL_COMMUNITY)
Admission: RE | Admit: 2019-09-14 | Discharge: 2019-09-14 | Disposition: A | Discharge status: Home / Self Care | Payer: PPO | Source: Ambulatory Visit | Attending: Critical Care Medicine | Admitting: Critical Care Medicine

## 2019-09-14 DIAGNOSIS — I480 Paroxysmal atrial fibrillation: Secondary | ICD-10-CM | POA: Insufficient documentation

## 2019-09-14 DIAGNOSIS — R9431 Abnormal electrocardiogram [ECG] [EKG]: Secondary | ICD-10-CM | POA: Diagnosis not present

## 2019-09-14 DIAGNOSIS — E785 Hyperlipidemia, unspecified: Secondary | ICD-10-CM | POA: Diagnosis not present

## 2019-09-14 DIAGNOSIS — R600 Localized edema: Secondary | ICD-10-CM

## 2019-09-14 DIAGNOSIS — G4733 Obstructive sleep apnea (adult) (pediatric): Secondary | ICD-10-CM | POA: Diagnosis not present

## 2019-09-14 DIAGNOSIS — R7303 Prediabetes: Secondary | ICD-10-CM | POA: Insufficient documentation

## 2019-09-14 DIAGNOSIS — I10 Essential (primary) hypertension: Secondary | ICD-10-CM | POA: Insufficient documentation

## 2019-09-14 DIAGNOSIS — R609 Edema, unspecified: Secondary | ICD-10-CM | POA: Insufficient documentation

## 2019-09-14 NOTE — Progress Notes (Signed)
*  PRELIMINARY RESULTS* Echocardiogram 2D Echocardiogram has been performed.  [name] 09/14/2019, 11:25 AM

## 2019-09-15 NOTE — Telephone Encounter (Signed)
Medication name and strength: Stiolto 2.5 PA approved/denied: DENIED If denied, reason for denial: Diagnoses code and was ran under Part D.   [place] at [phone] to get information as to why it was denied because there was no info in [facility]. Was told that it was because of the diagnoses code for ILD. It was also filed under part D. I was told to call the pharmacy to have them run it under part a or b because it was suggested on Denial letter. Was also told if [employer] ran it again we would need a different diagnoses code.   I called [facility] and asked them to run it under Part B and was told that they would have to find someone who does that because they do not. I will call back later to get an update.  Dr. Carlis Abbott this is just an FYI message

## 2019-09-15 NOTE — Telephone Encounter (Signed)
Try diagnosis of DOE, history of tobacco use.  Julian Hy, DO 09/15/19 1:26 PM [facility]

## 2019-09-16 ENCOUNTER — Telehealth: Payer: Self-pay | Admitting: Critical Care Medicine

## 2019-09-16 MED ORDER — MYCOPHENOLATE MOFETIL 500 MG PO TABS: 1000.0000 mg | ORAL_TABLET | Freq: Two times a day (BID) | ORAL | 3 refills | Status: DC

## 2019-09-16 NOTE — Telephone Encounter (Signed)
Called and spoke with pt and verified preferred pharmacy that med needed to be sent to. Med has been sent to preferred pharmacy for pt. Nothing further needed.

## 2019-09-16 NOTE — Progress Notes (Signed)
Please let Mr. Reagor know that his echocardiogram is stable- no change in heart function since the last one. Thanks!

## 2019-09-18 ENCOUNTER — Telehealth: Payer: Self-pay | Admitting: Critical Care Medicine

## 2019-09-18 NOTE — Telephone Encounter (Signed)
Called and spoke with Patient.  Patient stated [facility] pharmacy is needing info regarding insurance.   Called [facility].  Spoke with [place] PA is needing to be updated.  Message routed to [facility]

## 2019-09-21 NOTE — Telephone Encounter (Signed)
Pharmacy team does not handle Cellcept Pa's. Routing back to Triage.

## 2019-09-23 ENCOUNTER — Telehealth: Payer: Self-pay | Admitting: Critical Care Medicine

## 2019-09-23 MED ORDER — OFEV 150 MG PO CAPS: 150.0000 mg | ORAL_CAPSULE | Freq: Two times a day (BID) | ORAL | 5 refills | Status: DC

## 2019-09-23 NOTE — Telephone Encounter (Signed)
I am not convinced that it is a necessary medication with his lung disease. I think between the two, ofev is the one more likely to provide a benefit, and I am fine with a trial of stopping this one. It would be interesting to see if he could restart ofev off of this without side effects. If he wants to stop it, have it go ahead and do it and have a follow up visit in 2-4 weeks. He should let me know if there are any issues after stopping it, but I would expect these to not be noticed for several weeks most likely. Thanks!

## 2019-09-23 NOTE — Telephone Encounter (Signed)
Rx for OFEV was sent into [facility]. Nothing further is needed.   Patient Instructions by Julian Hy, DO at 08/27/2019 3:00 PM Author: Julian Hy, DO Author Type: Physician Filed: 08/27/2019 3:58 PM  Note Status: Addendum Cosign: Cosign Not Required Encounter Date: 08/27/2019  Editor: Julian Hy, DO (Physician)  Prior Versions: 1. Julian Hy, DO (Physician) at 08/27/2019 3:57 PM - Addendum   2. Julian Hy, DO (Physician) at 08/27/2019 3:55 PM - Addendum   3. Julian Hy, DO (Physician) at 08/27/2019 3:53 PM - Addendum   4. Julian Hy, DO (Physician) at 08/27/2019 3:52 PM - Addendum   5. Julian Hy, DO (Physician) at 08/27/2019 3:21 PM - Signed    Thank you for visiting Dr. Carlis Abbott at [facility]. We recommend the following:  -It is ok to decrease Ofev to once per day.  -Your chance of a stroke based on your risk factors with afib is 0.6% per year. If you decided to stop your Eliquis to take an NSAID, I recommend full dose aspirin over naproxen.      Orders Placed This Encounter  Procedures  . B Nat Peptide  . ECHOCARDIOGRAM COMPLETE        Orders Placed This Encounter

## 2019-09-23 NOTE — Telephone Encounter (Signed)
Medication: Mycophenolate 120 tablets/30 days Approval does not expire PA approval #: [place] [id number]  Called [employer] and spoke with Tim. PA has been done and approved. This approval does not expire. Test claim was ran and went through.   Patient called and notified.   Dr.Clark is asking how long he has to be on this medication? Please advise

## 2019-09-24 NOTE — Telephone Encounter (Signed)
Thanks

## 2019-09-24 NOTE — Telephone Encounter (Signed)
Called [facility] just to make sure they received RX for Ofev. Was informed they shipped out 100mg  tablets to patient yesterday and he has 12 refills. Patient has been called and notified. Nothing further needed at this time.

## 2019-09-24 NOTE — Telephone Encounter (Signed)
Called and spoke with patient about Dr. Anell Barr recs. I also called [facility] to make sure they received RX for Ofev and it was received and shipped to patient yesterday. Patient has been informed that both Ofev and Cellcept are now taken care of and covered. He expressed understanding about Dr. Idelle Leech and has been scheduled for a follow up with her on 10/21/19. Nothing further needed at this time.

## 2019-09-25 ENCOUNTER — Other Ambulatory Visit: Payer: Self-pay

## 2019-09-25 NOTE — Telephone Encounter (Signed)
Called [facility] to start PA for Stiolto using DX:DOE hx tobacco abuse as asked by Dr. Carlis Abbott. Response will be faxed to [facility] office. Will wait for response

## 2019-10-06 ENCOUNTER — Telehealth: Payer: Self-pay | Admitting: Critical Care Medicine

## 2019-10-06 NOTE — Telephone Encounter (Signed)
PA request was received from (pharmacy): Yes Phone:  Fax: [phone] Medication name and strength: Mycophenolate Mofetil Ordering Provider: Noemi Chapel, MD  Was PA started with [facility]?: Yes If yes, please enter KEY: [id number] Medication tried and failed:  Covered Alternatives: n/a  PA sent to plan, time frame for approval / denial: n/a at this time Routing to [facility] for follow-up

## 2019-10-07 NOTE — Telephone Encounter (Signed)
Fax received from [employer], approval of Mycophenolate 500 mg tablet already in effect. PA dismissed.

## 2019-10-08 ENCOUNTER — Telehealth: Payer: Self-pay | Admitting: Critical Care Medicine

## 2019-10-08 NOTE — Telephone Encounter (Signed)
[name] is returning phone call. Song phone number is [phone].

## 2019-10-08 NOTE — Telephone Encounter (Signed)
Attempted to call [employer] but unable to reach anyone and had to leave a message for them to return our call.

## 2019-10-08 NOTE — Telephone Encounter (Signed)
Spoke with Jerl Santos  Someone submitted PA for [employer] and chose "vials" for unit  I advised that this should have been for capsules  They have updated this and med has been approved  Nothing further needed

## 2019-10-09 DIAGNOSIS — M4722 Other spondylosis with radiculopathy, cervical region: Secondary | ICD-10-CM | POA: Diagnosis not present

## 2019-10-09 DIAGNOSIS — M542 Cervicalgia: Secondary | ICD-10-CM | POA: Diagnosis not present

## 2019-10-09 DIAGNOSIS — I10 Essential (primary) hypertension: Secondary | ICD-10-CM | POA: Diagnosis not present

## 2019-10-09 DIAGNOSIS — M4810 Ankylosing hyperostosis [Forestier], site unspecified: Secondary | ICD-10-CM | POA: Diagnosis not present

## 2019-10-13 ENCOUNTER — Other Ambulatory Visit (HOSPITAL_COMMUNITY): Payer: Self-pay | Admitting: Neurological Surgery

## 2019-10-13 ENCOUNTER — Other Ambulatory Visit: Payer: Self-pay | Admitting: Neurological Surgery

## 2019-10-13 DIAGNOSIS — M4722 Other spondylosis with radiculopathy, cervical region: Secondary | ICD-10-CM

## 2019-10-14 ENCOUNTER — Telehealth: Payer: Self-pay | Admitting: Critical Care Medicine

## 2019-10-14 NOTE — Telephone Encounter (Signed)
Received denial paper for patient's Stiolto inhaler today. PA can not be resubmitted with a COPD diagnosis since the PA has redone twice with ILD and SOB as the diagnosis. Letter has been typed and signed by Dr. Carlis Abbott. Will fax appeal to [facility] [employer] as requested by [employer]. Will keep this encounter open for follow up.

## 2019-10-15 DIAGNOSIS — M542 Cervicalgia: Secondary | ICD-10-CM | POA: Diagnosis not present

## 2019-10-15 DIAGNOSIS — M4722 Other spondylosis with radiculopathy, cervical region: Secondary | ICD-10-CM | POA: Diagnosis not present

## 2019-10-21 ENCOUNTER — Other Ambulatory Visit: Payer: Self-pay

## 2019-10-21 ENCOUNTER — Ambulatory Visit: Payer: PPO | Admitting: Critical Care Medicine

## 2019-10-21 DIAGNOSIS — M4722 Other spondylosis with radiculopathy, cervical region: Secondary | ICD-10-CM | POA: Diagnosis not present

## 2019-10-21 MED ORDER — ANORO ELLIPTA 62.5-25 MCG/INH IN AEPB
1.0000 | INHALATION_SPRAY | Freq: Every day | RESPIRATORY_TRACT | 3 refills | Status: DC
Start: 2019-10-21 — End: 2020-07-06

## 2019-10-22 ENCOUNTER — Ambulatory Visit: Payer: PPO | Admitting: Internal Medicine

## 2019-10-22 ENCOUNTER — Other Ambulatory Visit: Payer: Self-pay

## 2019-10-22 ENCOUNTER — Encounter: Payer: Self-pay | Admitting: Internal Medicine

## 2019-10-22 ENCOUNTER — Telehealth: Payer: Self-pay

## 2019-10-22 VITALS — BP 112/62 | HR 90 | Temp 98.4°F | Ht 68.0 in | Wt 183.8 lb

## 2019-10-22 DIAGNOSIS — Z5181 Encounter for therapeutic drug level monitoring: Secondary | ICD-10-CM | POA: Diagnosis not present

## 2019-10-22 DIAGNOSIS — J849 Interstitial pulmonary disease, unspecified: Secondary | ICD-10-CM | POA: Diagnosis not present

## 2019-10-22 DIAGNOSIS — D849 Immunodeficiency, unspecified: Secondary | ICD-10-CM

## 2019-10-22 NOTE — Telephone Encounter (Signed)
Patient with diagnosis of afib on Eliquis for anticoagulation.    Procedure: C3-4 CERVICAL DECOMPRESSION / DISCECTOMY/ FUSION  Date of procedure: TBD  CHADS2-VASc score of 3 (age, HTN, CAD)  CrCl 62mL/min Platelet count 171K  Per office protocol, patient can hold Eliquis for 3 days prior to procedure.

## 2019-10-22 NOTE — Telephone Encounter (Signed)
° °  Primary Cardiologist: Shelva Majestic, MD  Chart reviewed as part of pre-operative protocol coverage. Given past medical history and time since last visit, based on ACC/AHA guidelines, Imanuel Pruiett [place] would be at acceptable risk for the planned procedure without further cardiovascular testing.   Patient with diagnosis of afib on Eliquis for anticoagulation.    Procedure: C3-4 CERVICAL DECOMPRESSION / DISCECTOMY/ FUSION Date of procedure: TBD  CHADS2-VASc score of 3 (age, HTN, CAD)  CrCl 23mL/min Platelet count 171K  Per office protocol, patient can hold Eliquis for 3 days prior to procedure.  I will route this recommendation to the requesting party via Epic fax function and remove from pre-op pool.  Please call with questions.  Jossie Ng. Anwitha Mapes NP-C    10/22/2019, 1:41 PM [facility] [place] [address] Office [phone] Fax [phone]

## 2019-10-22 NOTE — Telephone Encounter (Signed)
    [facility] Pre-operative Risk Assessment    HEARTCARE STAFF: - Please ensure there is not already an duplicate clearance open for this procedure. - Under Visit Info/Reason for Call, type in Other and utilize the format Clearance MM/DD/YY or Clearance TBD. Do not use dashes or single digits. - If request is for dental extraction, please clarify the # of teeth to be extracted.  Request for surgical clearance:  1. What type of surgery is being performed? C3-4 CERVICAL DECOMPRESSION / DISCECTOMY/ FUSION   2. When is this surgery scheduled? TBD   3. What type of clearance is required (medical clearance vs. Pharmacy clearance to hold med vs. Both)? BOTH  4. Are there any medications that need to be held prior to surgery and how long? ELIQUIS   5. Practice name and name of physician performing surgery? [facility] ATTN:JESSICA   6. What is the office phone number? [phone]   7.   What is the office fax number? [phone]  8.   Anesthesia type (None, local, MAC, general) ? GENERAL

## 2019-10-22 NOTE — Patient Instructions (Addendum)
ICD-10-CM   1. ILD (interstitial lung disease) ([place])  J84.9   2. Immunosuppressed status ([place])  D84.9   3. Therapeutic drug monitoring  Z51.81    You have interstitial lung disease otherwise call pulmonary fibrosis The type of pulmonary fibrosis that you have is non-- IPF. Within this family of non-- IPF   -You either have IPAF [related to positive rheumatoid factor] versus undifferentiated interstitial lung disease based on biopsy  -The overall treatment approach to this family group is to    - treat with antifibrotic's if you demonstrate worsening.  Here it is a somewhat of a confusing picture why his symptoms got worse without antifibrotic's and got better with antifibrotic's while a CT scan of the chest is stable over time with pulmonary function tests showing a lot of variability for unclear reasons   -We use immunomodulation with CellCept and/or prednisone if antibodies are strongly positive   -At this point in time we will have to concur that the nintedanib was beneficial but might of caused diverticulitis along with CellCept  Plan -Check blood work for CBC, bmet, liver function test,, ESR, ANA, rheumatoid factor and CCP -Continue prednisone at 5 mg/day -Stop CellCept and Bactrim -given significant immunosuppression -Continue nintedanib at 150 mg/day [this is a low subtherapeutic dose] for now  -Hold 2 days before spine surgery due to theoretical bleeding risk and for 1 week at least after the spine surgery to allow for wound healing  -Okay for C-spine surgery -Do spirometry and DLCO end of July 2021/early August 2021  Follow-up -30-minute office visit end of July 2021/early August 2021 -at the time we will discuss switch of nintedanib to pirfenidone for antifibrotic effect  -If ILD is progressively symptoms getting worse we can always reintroduce CellCept

## 2019-10-22 NOTE — Progress Notes (Signed)
Synopsis: Referred in November 2017 for evaluation of shortness of breath.  Found to have diffuse parenchymal lung disease of undetermined cause. An open lung biopsy performed on 02/19/2018 showed an early fibrotic process suggestive of UIP.  He only smoked cigarettes for about 3 to 6 months when he was a teenager.  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    OCt 2019   [name] returns to clinic today to discuss the results from his recent open lung biopsy which showed early fibrotic changes suggestive of UIP as well as emphysema.  He tells me today that he is slowly feeling better but he does still have some fatigue and shortness of breath.  He has not been exercising much lately.  He tried to go fishing yesterday and he says that any walking was quite difficult for him.  He says that he is not coughing too much right now.  He is taking prednisone still as directed by her clinic earlier this month and he is wondering what to do with that.  He is still taking a bronchodilator.  He tells me today that he only smokes cigarettes for about 3 to 6 months when he was a teenager.  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   OV March 2020 [name] has returned to clinic today to see me.  He recently was hospitalized for diverticulitis, perforated, requiring emergent surgery. He says that his wound is slowly healing. He resumed CellCept and prednisone per my recommendation. His wife says that his wound is healing fairly well.  His shortness of breath is at baseline.  No recent pneumonia or bronchitis.  He is taking his nintedanib.   [name] GENDER: male DOB: 19-May-1945, MRN: [id number]  Chief Complaint  Patient presents with  . Follow-up    Patient has quit taking the OFEV and has felt a lot  better since then. Patient was taking Stiolto but got thrush and took some Nystatin and it resolved it. Patient needs back surgery in about 3-4 months.      OV 04/16/2019: [name] is a 74 year old gentleman who presents for follow-up of NSIP.  He has had worse symptoms since interrupting his CellCept and nintedanib for his colectomy reversal.  He had bleeding when he initially restarted these medicines a few weeks postop, which required stopping them again.  He restarted his nintedanib last week and CellCept with Bactrim about 2 weeks prior.  He has not had any additional bleeding.  No abdominal pain.  He feels better than he did before surgery.  He still has mild dyspnea, not worse than his last visit.  He notices it most when he is leaning over.  No other complaints.  His PCP did his 49-monthlab work earlier this week, and he will call the office to request they send uKorearesults.   OV 03/19/2019: [name] a 74year old gentleman with history of ILD to NSIP and polymyalgia rheumatica on chronic immunosuppression who presents for follow-up.  He underwent colostomy reversal on 01/26/2019, for which his CellCept plus Bactrim and nintedanib were held due to concern for potential surgical complications.  Several weeks following surgery his medications were restarted, and within a day or 2 he developed rectal bleeding.  This continued for several days until he  notified us, and we instructed him to stop.  Within the next day or 2 the bleeding has stopped.  He notices that his breathing has started to get a little bit worse over the last few weeks, and he would like to restart his medications.  He feels like he cannot exert himself due to his mild dyspnea.  He denies abdominal pain, fevers, nausea, vomiting, diarrhea, constipation, or periodic rectal bleeding.  He is following up with Dr. Marlou Roberts surgery tomorrow.  He had had a colonoscopy just prior to surgery, but is unsure if he had hemorrhoids.  He  follows with Dr. Amil Roberts in rheumatology.  His prednisone has been tapered down to 7 mg, which will be his dose for now.  There has been mention of possibly starting Plaquenil in the future for his polymyalgia rheumatica.  OV 01/21/19: [name] is a 75 year old gentleman with a history of fibrosing NSIP diagnosed on open lung biopsy in 2019 who presents for routine follow-up and preoperative evaluation before colostomy reversal next week.  He has a history of polymyalgia rheumatica for which she has been on chronic steroids.  He is followed by Dr. Amil Roberts from rheumatology.  Currently he is being titrated off his chronic prednisone, currently on 8 mg daily.  For his NSIP he is maintained on CellCept, nintedanib, and Bactrim 3 days a week.  He is doing well, better than last year from breathlessness standpoint.  He went hiking with his wife a few weeks ago, which he reports he would have been unable to do a year ago.  He has nausea and diarrhea associated with nintedanib, but he is able to tolerate these with the use of Zofran and Imodium.  No jaundice, itching, right upper quadrant pain.  In February 2020 he underwent emergency partial colectomy with diverting colostomy for perforated diverticulitis.  He is planning to undergo colostomy reversal next week.  In July he fell, causing an L1 compression fracture.  Dr. Amil Roberts is planning to do a bone mineral density study, and currently he is not on calcium and vitamin D supplements.  In a few months he is planning on having a small outpatient back surgery to address a bone spur that is causing radicular pain in his left leg.  Today he wants to discuss plans for his pulmonary meds perioperatively.  He has previously had symptomatic renal insufficiency when he tried to come off prednisone on his own too quickly.  During his VATS and colectomy he received 10 mg of dexamethasone in the OR, and he received low-dose Solu-Medrol postoperatively from his  colectomy.     OV 07/21/19: [name] is a 74 year old gentleman with a history of ILD- UIP vs NSIP on chronic Ofev, mycophenolate who presents for follow-up.  He required disruption of these medications for several months in late 2020 his colostomy reversal.  When the meds were restarted postoperatively he had persistent bleeding, leading to prolonged time off medications and restarting them sequentially.  He feels that his breathing has gotten worse over time since then, but is unsure if that was all due to being off the meds or if it is continued to decline since then.  His breathing is usually worse during warmer weather.  He has noticed that his activities are more limited than they used to be.  He recently had an outpatient back surgery where he had to have bone fragments removed that were pinching and nerves.  He was developing left lower extremity weakness preoperatively.  Due  to ongoing nerve impingement that is presumed to be due to inflammation, he is currently on a prednisone taper.  Currently on 20 mg daily and will decrease to 15 mg daily tomorrow.  He is on chronic prednisone for polymyalgia rheumatica at 7 mg daily.  He has been on Ofev 100 mg twice daily.  He was initially started on 150 mg twice daily, but required decrease in his dose due to nausea, vomiting, diarrhea.  Over time the symptoms have improved.  He restarted his meds postoperatively in the fall 2020 he had symptoms short-term, but noticed improvement over time.  When he had GI symptoms at the higher dose, he had some improvement with Zofran and Phenergan.  He is currently not having blood in his stools or other issues related to surgery.    He and his wife are planning a cross-country trip with their RV to visit children and grandchildren later this spring where they will be gone for 2 months.  Reviewed rheumatology, Dr. Melissa Noon clinic note 06/15/2019.  Considering starting Plaquenil to reduce dose of prednisone.   OV  4?!5/21  [name] is a 74 year old gentleman with a history of IPF who presents for follow-up.  He is accompanied by his wife today.  He recently had a severe shortness of breath episode that was precipitated by being around a significant amount of sawdust.  He did well with Stiolto, but developed thrush, which has been an issue for him with inhalers in the past.  He is improved with nystatin.  He stopped his Ofev due to severe intractable nausea and vomiting.  He now feels much better and is back to normal.  His shortness of breath is persistent-he has trouble walking around the house.  He has continued on his Bactrim and CellCept plus prednisone for polymyalgia rheumatica.  He wants to know if he can stop his Eliquis to be able to take an NSAID for help with his joint pain.  He has new leg edema bilaterally, but no edema elsewhere.  No chest pain.       OV 10/22/2019  Subjective:  [name], male , DOB: 09-22-1945 , age 56 y.o. , MRN: [id number] , ADDRESS: [place] [address]   PCP [name], MD Rheum - Dr Jannette Fogo 0Dr McQuaid -> Dr Ernest Mallick -> Dr Chase Caller  10/22/2019 -   Chief Complaint  Patient presents with  . Follow-up    Pt being referred to Dr. Chase Caller by Dr. Carlis Abbott for ILD.  Pt states he has been doing good since last visit and states that he feels SOB is stable. Denies any complaints of cough or chest discomfort.  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  - in 2019 : considered progressive pre-bx (on PFT/CT per notes)  - in 2019 - clinical CTD ruled out by Dr. Esmeralda Arthur record review_  -High-resolution CT March 2021: Indeterminate for UIP without change since 2017  RX  -  Mid-endJan 2020 -s tarted cellcept/bactrim + ofev (course complicated early February 2020 by diverticulitis and intermittent side effects with stop start of the nintedanib)  Pathological tissue mild emphysema present on surgical lung biopsy October 2019 [not evident on pulmonary function testing]  - Alpha 1 - MM:  Oct 2019  -On inhaler therapy  -No obstruction seen on PFT or emphysema on CT   History of perforated sigmoid diverticulitis early February 2021 few weeks after starting nintedanib/CellCept  -Status post reversal of colostomy in September 2020   HPI [name] 74 y.o. -presents to the ILD center with the above history.  Is a transfer of care to the ILD center.  Recently his case was discussed in June 2021 with interstitial lung disease multidisciplinary conference.  His pathology though showed fibroblastic foci did have then reformed branching fibrosis.  There was ossification.  There is no honeycombing.  There was heterogeneity and fibroblastic foci suggestive of UIP.  Overall it was felt that he had unclassifiable ILD.  The radiologist felt his CT scan had not progressed between 2018 and 2021.  His pulmonary function test were variable.  Therefore it was recommended that he be seen at the ILD center.  His ILD diagnosis as above.  He tells me though that when he started the nintedanib he started feeling better.  Although at the same time or before that he was also committed to inhaler therapy based on pathological emphysema on his lung biopsy from 2019.  This is also helped.  Therefore he is a concomitant diagnosis COPD/emphysema.  He is currently here to discuss his future care options.  He tells me  that he has continued CellCept and Bactrim for his ILD since early 2020 except for interruptions due to surgery.  He has been off nintedanib but recently has rechallenged himself at 150 mg once daily for the last 1 month and he is tolerating it fine.  His rheumatologist is working down on his prednisone to 5 mg/day [this is for polymyalgia rheumatica].  He tells me he has upcoming spinal surgery to the C-spine and later to the L-spine.  He wants clearance for this.  Noted he is on anticoagulation with Eliquis and has had bleeding episode.  [facility] Comprehensive ILD Questionnaire  Symptoms:    Overall he is reporting progressive dyspnea although the conference it was felt radiology did not change over time.  Is been going on insidious onset for the last 3 years.  Dyspnea present on exertion relieved by rest.    SYMPTOM SCALE - ILD 10/22/2019   O2 use ra  Shortness of Breath 0 -> 5 scale with 5 being worst (score 6 If unable to do)  At rest 1  Simple tasks - showers, clothes change, eating, shaving 3  Household (dishes, doing bed, laundry) 3  Shopping 3  Walking level at own pace 4  Walking up Stairs 5  Total (30-36) Dyspnea Score 19  How bad is your cough? 0  How bad is your fatigue yes  How bad is nausea yes  How bad is vomiting?  n  How bad is diarrhea? no  How bad is anxiety? x  How bad is depression x        Past Medical History : As above   ROS: Positive for fatigue and diffuse arthralgia.  No dysphagia.  No dry eyes.  No Raynaud's no weight loss.  No vomiting.  No rash   FAMILY HISTORY of LUNG DISEASE:  -Father had  asthma.  No COPD no sarcoid no cystic fibrosis no hypersensitive pneumonitis no autoimmune disease.  EXPOSURE HISTORY: Briefly smoked cigarettes for 3 years.  Otherwise never smoked  Cigarettes.  He did smoke marijuana very little in 1972 and never use cocaine.  No intravenous drug use     HOME and HOBBY DETAILS [place].  Age of the home is 41 years.  He has lived in the home for 30 years.  No dampness no mildew no mold no humidifier use no CPAP use no nebulizer use.  No steam iron use.  No Jacuzzi use.  There is a misting Fountain in the house because his wife likes it.  But there is no mold or mildew.  No pet gerbils.  No feather pillows.  No mold in the [facility] duct.  No music habits no guarding habits.  No flood of water damage.  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  In the past he is done woodwork, Brewing technologist work, Biomedical engineer.  He has been exposed to asbestos woodwork and furniture work.  During furniture work he got exposed to meth saline chloride.  He also is a [place] (27 items): CellCept, prednisone and nintedanib       Simple office walk 185 feet x  3 laps goal with forehead probe 10/22/2019   O2 used ra  Number laps completed 3  Comments about pace avg  Resting Pulse Ox/HR 98% and 90/min  Final Pulse Ox/HR 96% and 107/min  Desaturated </= 88% no  Desaturated <= 3% points no  Got Tachycardic >/= 90/min yes  Symptoms at end of test Mild dyspnea  Miscellaneous comments x       Results for RAVON, MORTELLARO "SI" (MRN [id number]) as of 10/22/2019 16:07  Ref. Range 05/03/2016 16:34 10/26/2016 09:28 01/10/2018 11:01 07/15/2019 08:59  FVC-Pre Latest Units: L 3.55 4.21 3.52 3.58  FVC-%Pred-Pre Latest Units: % 80 96 82 88   Results for Daisey, Brenan SILER "SI" (MRN [id number]) as of 10/22/2019 16:07  Ref. Range 05/03/2016 16:34 10/26/2016 09:28 01/10/2018 11:01 07/15/2019 08:59  TLC Latest Units: L 5.85 6.79 7.25 5.86  TLC % pred Latest Units: % 84 98 106 88   Results for Regina, Kaitlyn SILER "SI" (MRN [id number]) as of 10/22/2019 16:07  Ref. Range 05/03/2016 16:34 10/26/2016 09:28 01/10/2018 11:01  07/15/2019 08:59  DLCO unc Latest Units: ml/min/mmHg 23.92 26.71 21.15 19.51  DLCO unc % pred Latest Units: % 75 84 68 80      ROS - per HPI  Results for IVER, MIKLAS SILER "SI" (MRN [id number]) as of 10/22/2019 16:07  Ref. Range 05/29/2016 10:05 10/10/2016 13:54 01/14/2018 09:50 01/14/2018 09:50 03/12/2018 11:24  Aspergillus flavus Unknown REPORT      Aspergillus Fumigatus Latest Ref Range: NEGATIVE     NEGATIVE   A fumigatus #1 Unknown REPORT      Aspergillus [place] Unknown REPORT      [place] Serum Latest Ref Range: NEGATIVE  REPORT   NEGATIVE   Apullulans Unknown REPORT      A-1 Antitrypsin, Ser Latest Ref Range: 83 - 199 mg/dL     125  Anti Nuclear Antibody (ANA) Latest Ref Range: NEGATIVE  NEG   NEGATIVE   CENTROMERE AB SCREEN Latest Ref Range: <  1.0 NEG AI <1.0 NEG  <3.4 NEG    Cyclic Citrullin Peptide Ab Latest Units: UNITS <16   <16   RA Latex Turbid. Latest Ref Range: <14 IU/mL 24 (H)   38 (H)   B burgdorferi Ab IgG+IgM Latest Ref Range: <0.90 Index  <0.90     SSA (Ro) (ENA) Antibody, IgG Latest Ref Range: <1.0 NEG AI <1.0 NEG   <1.0 NEG   SSB (La) (ENA) Antibody, IgG Latest Ref Range: <1.0 NEG AI <1.0 NEG   <1.0 NEG   Scleroderma (Scl-70) (ENA) Antibody, IgG Latest Ref Range: <1.0 NEG AI <1.0 NEG  <1.0 NEG <1.0 NEG      has a past medical history of Allergy to alpha-gal, Arthritis, Chest pain, Complication of anesthesia, Digestive problems, Dyspnea, Dysrhythmia, GERD (gastroesophageal reflux disease), H/O viral myocarditis, Head injury, closed, with concussion, Hyperlipidemia, Mild mitral valve prolapse, PAF (paroxysmal atrial fibrillation) ([place]), Polymyalgia rheumatica ([place]), Pre-diabetes, Pulmonary fibrosis ([place]), and Sleep apnea.   reports that he has never smoked. He has never used smokeless tobacco.  Past Surgical History:  Procedure Laterality Date  . apendectomy  1965  . APPENDECTOMY    . bone spur Bilateral 1999   feet  . CARDIAC CATHETERIZATION  2001  .  CHOLECYSTECTOMY  2004  . COLON RESECTION N/A 06/15/2018   Procedure: SIGMOID COLON RESECTION;  Surgeon: [name], MD;  Location: [facility];  Service: General;  Laterality: N/A;  . COLONOSCOPY    . COLOSTOMY N/A 06/15/2018   Procedure: COLOSTOMY;  Surgeon: [name], MD;  Location: [facility];  Service: General;  Laterality: N/A;  . COLOSTOMY TAKEDOWN N/A 01/26/2019   Procedure: LAPAROSCOPIC ASSISTED COLOSTOMY TAKEDOWN;  Surgeon: [name], MD;  Location: [place];  Service: General;  Laterality: N/A;  . EYE SURGERY Bilateral    cataract removal  . LAPAROSCOPIC LYSIS OF ADHESIONS N/A 01/26/2019   Procedure: LAPAROSCOPIC LYSIS OF ADHESIONS;  Surgeon: [name], MD;  Location: [facility];  Service: General;  Laterality: N/A;  . LAPAROTOMY N/A 06/15/2018   Procedure: EXPLORATORY LAPAROTOMY;  Surgeon: [name], MD;  Location: [facility];  Service: General;  Laterality: N/A;  . LUMBAR LAMINECTOMY/ DECOMPRESSION WITH MET-RX Right 05/05/2013   Procedure: Right Lumbar three-four Extraforaminal Microdiskectomy with Metrex;  Surgeon: [name], MD;  Location: [facility];  Service: Neurosurgery;  Laterality: Right;  Right Lumbar three-four Extraforaminal Microdiskectomy with Metrex  . LUNG BIOPSY Right 02/19/2018   Procedure: LUNG BIOPSY;  Surgeon: [name], MD;  Location: [facility];  Service: Thoracic;  Laterality: Right;  . SHOULDER OPEN ROTATOR CUFF REPAIR Bilateral 2001  . TONSILLECTOMY    . VIDEO ASSISTED THORACOSCOPY Right 02/19/2018   Procedure: VIDEO ASSISTED THORACOSCOPY;  Surgeon: [name], MD;  Location: [facility];  Service: Thoracic;  Laterality: Right;    Allergies  Allergen Reactions  . Lidocaine Hcl Anaphylaxis and Other (See Comments)    Xylocaine  . Xylocaine [Lidocaine] Anaphylaxis  . Codeine Nausea And Vomiting  . Crestor [Rosuvastatin Calcium] Other (See Comments)    All-over body aches Myalgias   . Statins Other (See Comments)    Muscle soreness and an  aching feeling all over Myalgias  . Percocet [Oxycodone-Acetaminophen] Itching    Immunization History  Administered Date(s) Administered  . Influenza Split 03/20/2016  . Influenza, High Dose Seasonal PF 01/29/2018, 02/25/2019, 03/20/2019  . Influenza,inj,Quad PF,6+ Mos 03/12/2017  . Moderna SARS-COVID-2 Vaccination 06/04/2019, 07/10/2019  . Pneumococcal Conjugate-13 01/10/2018  Family History  Problem Relation Age of Onset  . Rheum arthritis Mother   . Thyroid cancer Mother   . Heart disease Father   . Asthma Father   . Thyroid cancer Sister   . Melanoma Brother      Current Outpatient Medications:  .  acetaminophen (TYLENOL) 500 MG tablet, Take 1,000 mg by mouth every 6 (six) hours as needed for mild pain or headache. , Disp: , Rfl:  .  albuterol (VENTOLIN HFA) 108 (90 Base) MCG/ACT inhaler, Inhale 2 puffs into the lungs every 6 (six) hours as needed., Disp: 18 g, Rfl: 11 .  budesonide-formoterol (SYMBICORT) 80-4.5 MCG/ACT inhaler, Inhale 2 puffs into the lungs 2 (two) times daily. (Patient taking differently: Inhale 2 puffs into the lungs daily. ), Disp: 1 Inhaler, Rfl: 12 .  CARTIA XT 120 MG 24 hr capsule, TAKE ONE CAPSULE BY MOUTH DAILY, Disp: 30 capsule, Rfl: 6 .  cetirizine (ZYRTEC) 10 MG tablet, Take 10 mg by mouth daily as needed for allergies. , Disp: , Rfl:  .  Cholecalciferol (NATURAL VITAMIN D-3 PO), Take 6,000 Units by mouth daily., Disp: , Rfl:  .  diphenoxylate-atropine (LOMOTIL) 2.5-0.025 MG tablet, Take 1 tablet by mouth 4 (four) times daily as needed for diarrhea or loose stools., Disp: , Rfl:  .  ELIQUIS 5 MG TABS tablet, TAKE ONE TABLET ('5MG'$  TOTAL) BY MOUTH TWODAILY (Patient taking differently: Take 5 mg by mouth 2 (two) times daily. ), Disp: 180 tablet, Rfl: 1 .  fluticasone (FLONASE) 50 MCG/ACT nasal spray, Place 2 sprays into both nostrils daily as needed for allergies or rhinitis., Disp: , Rfl:  .  gabapentin (NEURONTIN) 300 MG capsule, Take 300 mg by  mouth 3 (three) times daily. , Disp: , Rfl:  .  methocarbamol (ROBAXIN) 750 MG tablet, Take 1 tablet (750 mg total) by mouth 4 (four) times daily as needed (use for muscle cramps/pain)., Disp: 20 tablet, Rfl: 0 .  metoprolol tartrate (LOPRESSOR) 25 MG tablet, TAKE ONE TABLET BY MOUTH TWICE A DAY, Disp: 180 tablet, Rfl: 3 .  mycophenolate (CELLCEPT) 500 MG tablet, Take 2 tablets (1,000 mg total) by mouth 2 (two) times daily., Disp: 120 tablet, Rfl: 3 .  Nintedanib (OFEV) 150 MG CAPS, Take 1 capsule (150 mg total) by mouth 2 (two) times daily., Disp: 60 capsule, Rfl: 5 .  nystatin (MYCOSTATIN) 100000 UNIT/ML suspension, Take 5 mLs (500,000 Units total) by mouth every other day., Disp: 437 mL, Rfl: 11 .  Omega-3 Fatty Acids (FISH OIL) 500 MG CAPS, Take 1 capsule by mouth 2 (two) times daily., Disp: , Rfl:  .  ondansetron (ZOFRAN) 4 MG tablet, Take 4 mg by mouth every 8 (eight) hours as needed for nausea or vomiting., Disp: , Rfl:  .  predniSONE (DELTASONE) 5 MG tablet, Take 5 mg by mouth daily. , Disp: , Rfl:  .  Probiotic Product (PROBIOTIC DAILY PO), Take 1 tablet by mouth daily., Disp: , Rfl:  .  sulfamethoxazole-trimethoprim (BACTRIM DS) 800-160 MG tablet, 1 Monday Wednesday Friday, Disp: 36 tablet, Rfl: 3 .  tamsulosin (FLOMAX) 0.4 MG CAPS capsule, Take 0.4 mg by mouth 2 (two) times daily. , Disp: , Rfl:  .  umeclidinium-vilanterol (ANORO ELLIPTA) 62.5-25 MCG/INH AEPB, Inhale 1 puff into the lungs daily., Disp: 60 each, Rfl: 3      Objective:   Vitals:   10/22/19 1548  BP: 112/62  Pulse: 90  Temp: 98.4 F (36.9 C)  TempSrc: Oral  SpO2: 98%  Weight: 183 lb 12.8 oz (83.4 kg)  Height: _0  (1.727 m)    Estimated body mass index is 27.95 kg/m as calculated from the following:   Height as of this encounter: _1  (1.727 m).   Weight as of this encounter: 183 lb 12.8 oz (83.4 kg).  _2 @  [employer]   10/22/19 1548  Weight: 183 lb 12.8 oz (83.4 kg)     Physical  Exam  General Appearance:    Alert, cooperative, no distress, appears stated age - yes , Deconditioned looking - no , OBESE  - no, Sitting on Wheelchair -  no  Head:    Normocephalic, without obvious abnormality, atraumatic  Eyes:    PERRL, conjunctiva/corneas clear,  Ears:    Normal TM's and external ear canals, both ears  Nose:   Nares normal, septum midline, mucosa normal, no drainage    or sinus tenderness. OXYGEN ON  - ra . Patient is @ ra   Throat:   Lips, mucosa, and tongue normal; teeth and gums normal. Cyanosis on lips - no  Neck:   Supple, symmetrical, trachea midline, no adenopathy;    thyroid:  no enlargement/tenderness/nodules; no carotid   bruit or JVD  Back:     Symmetric, no curvature, ROM normal, no CVA tenderness  Lungs:     Distress - no , Wheeze no, Barrell Chest - no, Purse lip breathing - no, Crackles - mild yes at base   Chest Wall:    No tenderness or deformity.    Heart:    Regular rate and rhythm, S1 and S2 normal, no rub   or gallop, Murmur - no  Breast Exam:    NOT DONE  Abdomen:     Soft, non-tender, bowel sounds active all four quadrants,    no masses, no organomegaly. Visceral obesity - yes with surgical scar  Genitalia:   NOT DONE  Rectal:   NOT DONE  Extremities:   Extremities - normal, Has Cane - no, Clubbing - no, Edema - no  Pulses:   2+ and symmetric all extremities  Skin:   Stigmata of Connective Tissue Disease - n  Lymph nodes:   Cervical, supraclavicular, and axillary nodes normal  Psychiatric:  Neurologic:   Pleasant - yes, Anxious - no, Flat affect - no  CAm-ICU - neg, Alert and Oriented x 3 - yes, Moves all 4s - yes, Speech - normal, Cognition - intact           Assessment:       ICD-10-CM   1. ILD (interstitial lung disease) (HCC)  J84.9 CBC w/Diff    Basic Metabolic Panel (BMET)    Hepatic function panel    Sed Rate (ESR)    ANA    Rheumatoid Factor    Cyclic citrul peptide antibody, IgG    Pulmonary function test    Cyclic  citrul peptide antibody, IgG    Rheumatoid Factor    ANA    Sed Rate (ESR)    Hepatic function panel    Basic Metabolic Panel (BMET)    CBC w/Diff  2. Immunosuppressed status ([place])  D84.9   3. Therapeutic drug monitoring  Z51.81    He has interstitial lung disease of indeterminate variety that on radiology is stable [he has fluctuating pulmonary function testing] but symptom wise he has been progressive.  There is no cardiac reason for progressive dyspnea.  On surgical lung pathology he has unclassifiable ILD that leads to his UIP  but overall features 1 of unclassifiable ILD.  His rheumatoid factor is trace positive one time and another time barely just about 2 times upper limit of normal.  Overall all this fits in with non-- IPF ILD including the radiologic stability.  Differential diagnosis would be unclassifiable ILD versus IPAF (interstitial lung disease due to autoimmune antibodies without overt connective tissue disease).  Officially he does not have NSIP or HP.  The general treatment approach to this would be serial monitoring and institute antifibrotic's if he gets worse.  However symptom wise he has been worse and he felt his that nintedanib did stabilize him   Therefore it would be okay for antifibrotic's.  However, given his diverticulitis that started after commencement of CellCept and nintedanib and intermittent GI issues I am not so sure this is the best choice at this point in time.  Nevertheless he seems committed to this.  He is taking a subtherapeutic dose of 150 mg once daily.  He has upcoming spinal surgery for which he will have to hold the drug.  At this point in time I told him to continue taking nintedanib 150 mg once daily [given hold instructions pre and post surgery] but at the following up visit will have to reevaluate about switching to the other antifibrotic pirfenidone which has less association with diverticulitis.  In addition I would review an Internet of a second line  in the presence of anticoagulation  In terms of immunosuppression review of the records indicate he does not have rheumatoid arthritis clinically.  His rheumatoid factor is only trace positive [we will check it again today].  At this point in time he has been on immunosuppression for over a year and getting up to 1-1/2 years.  I am concerned about the prolonged risk of immunosuppression.  Given the radiologic stability with the lung disease and no clear-cut evidence of connective tissue disease and the rheumatoid factor only being trace positive I recommended that he stop his CellCept/Bactrim.  I think prednisone at a lower dose for his PMR might be good enough if indeed there is some IPAF    In terms of upcoming spine surgery have approved this surgery from a pulmonary standpoint he would be of low moderate risk for pulmonary complications such as prolonged ventilator dependence.  But he would have to hold his nintedanib before and after surgery given critical bleeding risk and also wound healing issues.      Plan:     Patient Instructions     ICD-10-CM   1. ILD (interstitial lung disease) ([place])  J84.9   2. Immunosuppressed status ([place])  D84.9   3. Therapeutic drug monitoring  Z51.81    You have interstitial lung disease otherwise call pulmonary fibrosis The type of pulmonary fibrosis that you have is non-- IPF. Within this family of non-- IPF   -You either have IPAF [related to positive rheumatoid factor] versus undifferentiated interstitial lung disease based on biopsy  -The overall treatment approach to this family group is to    - treat with antifibrotic's if you demonstrate worsening.  Here it is a somewhat of a confusing picture why his symptoms got worse without antifibrotic's and got better with antifibrotic's while a CT scan of the chest is stable over time with pulmonary function tests showing a lot of variability for unclear reasons   -We use immunomodulation with CellCept and/or  prednisone if antibodies are strongly positive   -At this point in time we will have to  concur that the nintedanib was beneficial but might of caused diverticulitis along with CellCept  Plan -Check blood work for CBC, bmet, liver function test,, ESR, ANA, rheumatoid factor and CCP -Continue prednisone at 5 mg/day -Stop CellCept and Bactrim -given significant immunosuppression -Continue nintedanib at 150 mg/day [this is a low subtherapeutic dose] for now  -Hold 2 days before spine surgery due to theoretical bleeding risk and for 1 week at least after the spine surgery to allow for wound healing  -Okay for C-spine surgery -Do spirometry and DLCO end of July 2021/early August 2021  Follow-up -30-minute office visit end of July 2021/early August 2021 -at the time we will discuss switch of nintedanib to pirfenidone for antifibrotic effect  -If ILD is progressively symptoms getting worse we can always reintroduce CellCept    ( Level 05 visit: Estb 40-54 min   in  visit type: on-site physical face to visit  in total care time and counseling or/and coordination of care by this undersigned MD - Dr Brand Males. This includes one or more of the following on this same day 10/22/2019: pre-charting, chart review, note writing, documentation discussion of test results, diagnostic or treatment recommendations, prognosis, risks and benefits of management options, instructions, education, compliance or risk-factor reduction. It excludes time spent by the [facility] or office staff in the care of the patient. Actual time 119 min)   SIGNATURE    Dr. Brand Males, M.D., F.C.C.P,  Pulmonary and Critical Care Medicine Staff Physician, [place] Director - Interstitial Lung Disease  Program  Pulmonary [facility] at [place], [place], [address]  Pager: [phone], If no answer or between  15:00h - 7:00h: call [phone] Telephone: [phone]  6:35 PM 10/22/2019

## 2019-10-23 LAB — HEPATIC FUNCTION PANEL
ALT: 12 U/L (ref 0–53)
AST: 19 U/L (ref 0–37)
Albumin: 4.2 g/dL (ref 3.5–5.2)
Alkaline Phosphatase: 56 U/L (ref 39–117)
Bilirubin, Direct: 0.2 mg/dL (ref 0.0–0.3)
Total Bilirubin: 1.2 mg/dL (ref 0.2–1.2)
Total Protein: 6.3 g/dL (ref 6.0–8.3)

## 2019-10-23 LAB — CBC WITH DIFFERENTIAL/PLATELET
Basophils Absolute: 0.1 10*3/uL (ref 0.0–0.1)
Basophils Relative: 0.8 % (ref 0.0–3.0)
Eosinophils Absolute: 0 10*3/uL (ref 0.0–0.7)
Eosinophils Relative: 0.1 % (ref 0.0–5.0)
HCT: 47.9 % (ref 39.0–52.0)
Hemoglobin: 16.2 g/dL (ref 13.0–17.0)
Lymphocytes Relative: 31.6 % (ref 12.0–46.0)
Lymphs Abs: 2.4 10*3/uL (ref 0.7–4.0)
MCHC: 33.9 g/dL (ref 30.0–36.0)
MCV: 104.5 fl — ABNORMAL HIGH (ref 78.0–100.0)
Monocytes Absolute: 0.7 10*3/uL (ref 0.1–1.0)
Monocytes Relative: 9.2 % (ref 3.0–12.0)
Neutro Abs: 4.5 10*3/uL (ref 1.4–7.7)
Neutrophils Relative %: 58.3 % (ref 43.0–77.0)
Platelets: 213 10*3/uL (ref 150.0–400.0)
RBC: 4.58 Mil/uL (ref 4.22–5.81)
RDW: 12.9 % (ref 11.5–15.5)
WBC: 7.7 10*3/uL (ref 4.0–10.5)

## 2019-10-23 LAB — BASIC METABOLIC PANEL
BUN: 13 mg/dL (ref 6–23)
CO2: 27 mEq/L (ref 19–32)
Calcium: 9 mg/dL (ref 8.4–10.5)
Chloride: 102 mEq/L (ref 96–112)
Creatinine, Ser: 0.86 mg/dL (ref 0.40–1.50)
GFR: 87.37 mL/min (ref >60.00)
Glucose, Bld: 101 mg/dL — ABNORMAL HIGH (ref 70–99)
Potassium: 4 mEq/L (ref 3.5–5.1)
Sodium: 135 mEq/L (ref 135–145)

## 2019-10-23 LAB — SEDIMENTATION RATE: Sed Rate: 2 mm/hr (ref 0–20)

## 2019-10-23 LAB — CYCLIC CITRUL PEPTIDE ANTIBODY, IGG: Cyclic Citrullin Peptide Ab: <16 UNITS

## 2019-10-23 LAB — RHEUMATOID FACTOR: Rheumatoid fact SerPl-aCnc: 32 IU/mL — ABNORMAL HIGH (ref <14)

## 2019-10-23 LAB — ANA: Anti Nuclear Antibody (ANA): NEGATIVE

## 2019-10-23 NOTE — Telephone Encounter (Signed)
Letter received alternative Anoro sent in for patient. Also had OV with Dr. Chase Caller yesterday. Nothing further needed at this time.

## 2019-10-26 ENCOUNTER — Telehealth: Payer: Self-pay | Admitting: Pharmacist

## 2019-10-26 DIAGNOSIS — J849 Interstitial pulmonary disease, unspecified: Secondary | ICD-10-CM

## 2019-10-26 MED ORDER — OFEV 150 MG PO CAPS: 150.0000 mg | ORAL_CAPSULE | Freq: Every day | ORAL | 2 refills | Status: DC

## 2019-10-26 NOTE — Telephone Encounter (Signed)
Received rx request for Ofev via fax from [facility].  Last OV with Dr. Chase Caller patient is to continue Ofev low dose 150 mg daily. Patient last LFT within normal limits on 10/22/2018 and will need to monitor every 3 months.    Refill sent to [facility].    Mariella Saa, PharmD, [place], CPP Clinical Specialty Pharmacist (Rheumatology and Pulmonology)  10/26/2019 4:39 PM

## 2019-10-26 NOTE — Telephone Encounter (Signed)
I have reservations about his ofev due to bad diverticutlitis he had last year but he is already back on it. Told him to finish his spine surgery and to discuss esbriet  Question: could you please look up literature on incidence of diverticulitis after ofev or cellcept? YOu can email it . No rush

## 2019-10-27 ENCOUNTER — Other Ambulatory Visit: Payer: Self-pay | Admitting: Neurological Surgery

## 2019-10-27 NOTE — Telephone Encounter (Signed)
Thanks and closing message

## 2019-10-27 NOTE — Telephone Encounter (Signed)
Will e-mail any information I find on diverticulitis while on Ofev or Esbriet therapy.

## 2019-11-05 DIAGNOSIS — M25511 Pain in right shoulder: Secondary | ICD-10-CM | POA: Diagnosis not present

## 2019-11-05 DIAGNOSIS — M542 Cervicalgia: Secondary | ICD-10-CM | POA: Diagnosis not present

## 2019-11-05 DIAGNOSIS — E663 Overweight: Secondary | ICD-10-CM | POA: Diagnosis not present

## 2019-11-05 DIAGNOSIS — R768 Other specified abnormal immunological findings in serum: Secondary | ICD-10-CM | POA: Diagnosis not present

## 2019-11-05 DIAGNOSIS — S32010D Wedge compression fracture of first lumbar vertebra, subsequent encounter for fracture with routine healing: Secondary | ICD-10-CM | POA: Diagnosis not present

## 2019-11-05 DIAGNOSIS — M8080XD Other osteoporosis with current pathological fracture, unspecified site, subsequent encounter for fracture with routine healing: Secondary | ICD-10-CM | POA: Diagnosis not present

## 2019-11-05 DIAGNOSIS — Z6827 Body mass index (BMI) 27.0-27.9, adult: Secondary | ICD-10-CM | POA: Diagnosis not present

## 2019-11-05 DIAGNOSIS — Z7952 Long term (current) use of systemic steroids: Secondary | ICD-10-CM | POA: Diagnosis not present

## 2019-11-05 DIAGNOSIS — J84112 Idiopathic pulmonary fibrosis: Secondary | ICD-10-CM | POA: Diagnosis not present

## 2019-11-05 DIAGNOSIS — M7989 Other specified soft tissue disorders: Secondary | ICD-10-CM | POA: Diagnosis not present

## 2019-11-05 DIAGNOSIS — M353 Polymyalgia rheumatica: Secondary | ICD-10-CM | POA: Diagnosis not present

## 2019-11-05 NOTE — Pre-Procedure Instructions (Signed)
Your procedure is scheduled on Tuesday, June 29th from 12:50 PM- 2:23 PM.  Report to [facility] Main Entrance "A" at 10:50 A.M., and check in at the Admitting office.  Call this number if you have problems the morning of surgery:  [phone]  Call [phone] if you have any questions prior to your surgery date Monday-Friday 8am-4pm.    Remember:  Do not eat or drink after midnight the night before your surgery.     Take these medicines the morning of surgery with A SIP OF WATER: CARTIA XT  ezetimibe (ZETIA) gabapentin (NEURONTIN) metoprolol tartrate (LOPRESSOR) Nintedanib (OFEV) predniSONE (DELTASONE) tamsulosin (FLOMAX) umeclidinium-vilanterol (ANORO ELLIPTA)   IF NEEDED: acetaminophen (TYLENOL) cetirizine (ZYRTEC) ondansetron (ZOFRAN) fluticasone (FLONASE) albuterol (VENTOLIN HFA) inhaler- Please bring with you the day of surgery.     *Hold Eliquis 3 days prior to surgery*   As of today, STOP taking any Aspirin (unless otherwise instructed by your surgeon) and Aspirin containing products, diclofenac Sodium (VOLTAREN) GEL, Aleve, Naproxen, Ibuprofen, Motrin, Advil, Goody's, BC's, all herbal medications, fish oil, and all vitamins.         The Morning of Surgery:             Do not wear jewelry.            Do not wear lotions, powders, colognes, or deodorant.            Men may shave face and neck.            Do not bring valuables to the hospital.            [facility] is not responsible for any belongings or valuables.  Do NOT Smoke (Tobacco/Vapping) or drink Alcohol 24 hours prior to your procedure.  If you use a CPAP at night, you may bring all equipment for your overnight stay.   Contacts, glasses, dentures or bridgework may not be worn into surgery.      For patients admitted to the hospital, discharge time will be determined by your treatment team.   Patients discharged the day of surgery will not be allowed to drive home, and someone needs to stay  with them for 24 hours.    Special instructions:   [facility]- Preparing For Surgery  Before surgery, you can play an important role. Because skin is not sterile, your skin needs to be as free of germs as possible. You can reduce the number of germs on your skin by washing with CHG (chlorahexidine gluconate) Soap before surgery.  CHG is an antiseptic cleaner which kills germs and bonds with the skin to continue killing germs even after washing.    Oral Hygiene is also important to reduce your risk of infection.  Remember - BRUSH YOUR TEETH THE MORNING OF SURGERY WITH YOUR REGULAR TOOTHPASTE  Please do not use if you have an allergy to CHG or antibacterial soaps. If your skin becomes reddened/irritated stop using the CHG.  Do not shave (including legs and underarms) for at least 48 hours prior to first CHG shower. It is OK to shave your face.  Please follow these instructions carefully.   1. Shower the NIGHT BEFORE SURGERY and the MORNING OF SURGERY with CHG Soap.   2. If you chose to wash your hair, wash your hair first as usual with your normal shampoo.  3. After you shampoo, rinse your hair and body thoroughly to remove the shampoo.  4. Use CHG as you would any other liquid  soap. You can apply CHG directly to the skin and wash gently with a scrungie or a clean washcloth.   5. Apply the CHG Soap to your body ONLY FROM THE NECK DOWN.  Do not use on open wounds or open sores. Avoid contact with your eyes, ears, mouth and genitals (private parts). Wash Face and genitals (private parts)  with your normal soap.   6. Wash thoroughly, paying special attention to the area where your surgery will be performed.  7. Thoroughly rinse your body with warm water from the neck down.  8. DO NOT shower/wash with your normal soap after using and rinsing off the CHG Soap.  9. Pat yourself dry with a CLEAN TOWEL.  10. Wear CLEAN PAJAMAS to bed the night before surgery, wear comfortable clothes the  morning of surgery  11. Place CLEAN SHEETS on your bed the night of your first shower and DO NOT SLEEP WITH PETS.   Day of Surgery: Shower with CHG Soap.   Do not apply any deodorants/lotions.  Please wear clean clothes to the hospital/surgery center.   Remember to brush your teeth WITH YOUR REGULAR TOOTHPASTE.   Please read over the following fact sheets that you were given.

## 2019-11-06 ENCOUNTER — Encounter (HOSPITAL_COMMUNITY): Payer: Self-pay

## 2019-11-06 ENCOUNTER — Other Ambulatory Visit: Payer: Self-pay

## 2019-11-06 ENCOUNTER — Encounter (HOSPITAL_COMMUNITY)
Admission: RE | Admit: 2019-11-06 | Discharge: 2019-11-06 | Disposition: A | Discharge status: Home / Self Care | Payer: PPO | Source: Ambulatory Visit | Attending: Neurological Surgery | Admitting: Neurological Surgery

## 2019-11-06 ENCOUNTER — Other Ambulatory Visit (HOSPITAL_COMMUNITY)
Admission: RE | Admit: 2019-11-06 | Discharge: 2019-11-06 | Disposition: A | Discharge status: Home / Self Care | Payer: PPO | Source: Ambulatory Visit | Attending: Neurological Surgery | Admitting: Neurological Surgery

## 2019-11-06 DIAGNOSIS — Z20822 Contact with and (suspected) exposure to covid-19: Secondary | ICD-10-CM | POA: Insufficient documentation

## 2019-11-06 DIAGNOSIS — Z01812 Encounter for preprocedural laboratory examination: Secondary | ICD-10-CM | POA: Insufficient documentation

## 2019-11-06 LAB — CBC
HCT: 50.1 % (ref 39.0–52.0)
Hemoglobin: 16.9 g/dL (ref 13.0–17.0)
MCH: 35 pg — ABNORMAL HIGH (ref 26.0–34.0)
MCHC: 33.7 g/dL (ref 30.0–36.0)
MCV: 103.7 fL — ABNORMAL HIGH (ref 80.0–100.0)
Platelets: 210 10*3/uL (ref 150–400)
RBC: 4.83 MIL/uL (ref 4.22–5.81)
RDW: 12.5 % (ref 11.5–15.5)
WBC: 8.8 10*3/uL (ref 4.0–10.5)
nRBC: 0 % (ref 0.0–0.2)

## 2019-11-06 LAB — BASIC METABOLIC PANEL
Anion gap: 13 (ref 5–15)
BUN: 9 mg/dL (ref 8–23)
CO2: 23 mmol/L (ref 22–32)
Calcium: 9.2 mg/dL (ref 8.9–10.3)
Chloride: 102 mmol/L (ref 98–111)
Creatinine, Ser: 0.83 mg/dL (ref 0.61–1.24)
GFR calc Af Amer: >60 mL/min (ref >60)
GFR calc non Af Amer: >60 mL/min (ref >60)
Glucose, Bld: 159 mg/dL — ABNORMAL HIGH (ref 70–99)
Potassium: 4 mmol/L (ref 3.5–5.1)
Sodium: 138 mmol/L (ref 135–145)

## 2019-11-06 LAB — SARS CORONAVIRUS 2 (TAT 6-24 HRS): SARS Coronavirus 2: NEGATIVE

## 2019-11-06 LAB — TYPE AND SCREEN
ABO/RH(D): A POS
Antibody Screen: NEGATIVE

## 2019-11-06 LAB — SURGICAL PCR SCREEN
MRSA, PCR: NEGATIVE
Staphylococcus aureus: NEGATIVE

## 2019-11-06 MED ORDER — CHLORHEXIDINE GLUCONATE CLOTH 2 % EX PADS: 6.0000 | MEDICATED_PAD | Freq: Once | CUTANEOUS | Status: DC

## 2019-11-06 NOTE — Progress Notes (Signed)
PCP - Lenetta Quaker Cardiologist - T.KELLY    Chest x-ray - 3/21 EKG - 1/21 Stress Test - 11/17 ECHO - 5/21 Cardiac Cath - 1/01  Sleep Study - YES CPAP - NONE  Fasting Blood Sugar - NA     DUE TO STERIODS   Blood Thinner Instructions: Aspirin Instructions HOLD,..         [name]   HOLD 3 DAYS PRIOR    COVID TEST- FOR TODAY   Anesthesia review:HT HX   Patient denies shortness of breath, fever, cough and chest pain at PAT appointment   All instructions explained to the patient, with a verbal understanding of the material. Patient agrees to go over the instructions while at home for a better understanding. Patient also instructed to self quarantine after being tested for COVID-19. The opportunity to ask questions was provided.

## 2019-11-09 NOTE — Progress Notes (Signed)
Anesthesia Chart Review:  Followed by pulmonology for interstitial lung disease (s/p right VATS, lung biopsy x4 02/19/18). He is maintained on prednisone 5mg /day (also for polymyalgia rheumatica per rheumatology) and nintedanib (which is currently on hold for surgery). He was last seen by Dr. Chase Caller 10/22/19 and cleared for surgery at that time.  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 Cleared for surgery per telephone encounter 10/22/19 which states he can hold eliquis 3d preop.  Reported anaphylactic reaction to Xylocaine when used for shoulder injection > 10 years ago, but says he later received marcaine by neurosurgeon Dr. Ellene Route without known reaction.    History of alpha gal allergy in 2015, however previous notes state he has been able to eat red meat for 2+ years now without reaction.  History of reportedly mild OSA, not on CPAP.  Preop labs reviewed, unremarkable.  EKG 05/26/19: NSR. Rate 87.  PFT 07/15/19: FVC-%Pred-Pre Latest Units: % 88  FEV1-%Pred-Pre Latest Units: % 91  FEV1FVC-%Pred-Pre Latest Units: % 103  TLC % pred Latest Units: % 88  RV % pred Latest Units: % 94  DLCO unc % pred Latest Units: % 80   TTE 09/14/19: 1. Left ventricular ejection fraction, by estimation, is 60 to 65%. The  left ventricle has normal function. The left ventricle has no regional  wall motion abnormalities. Left ventricular diastolic parameters are  indeterminate.  2. Right ventricular systolic function is normal. The right ventricular  size is normal. There is normal pulmonary artery systolic pressure. The  estimated right ventricular systolic pressure is 29.5 mmHg.  3. The mitral valve is grossly normal. Trivial mitral valve  regurgitation.  4. The aortic valve is tricuspid. Aortic valve regurgitation is not  visualized.  5. The inferior vena cava is normal in size with greater than 50%   respiratory variability, suggesting right atrial pressure of 3 mmHg.   Nuclear stress test 03/21/16: Study Highlights  The left ventricular ejection fraction is normal (55-65%).  Nuclear stress EF: 61%.  The study is normal.  This is a low risk study.  There was no ST segment deviation noted during stress.   Wynonia Musty [facility] Short Stay Center/Anesthesiology Phone [phone] 11/09/2019 10:32 AM

## 2019-11-09 NOTE — Anesthesia Preprocedure Evaluation (Addendum)
Anesthesia Evaluation  Patient identified by MRN, date of birth, ID band Patient awake    Reviewed: Allergy & Precautions, NPO status , Patient's Chart, lab work & pertinent test results, reviewed documented beta blocker date and time   History of Anesthesia Complications Negative for: history of anesthetic complications  Airway Mallampati: III  TM Distance: >3 FB Neck ROM: Full    Dental  (+) Dental Advisory Given, Teeth Intact   Pulmonary sleep apnea ,   Pulmonary fibrosis    Pulmonary exam normal        Cardiovascular hypertension, Pt. on home beta blockers and Pt. on medications Normal cardiovascular exam+ dysrhythmias Atrial Fibrillation    '21 TTE - EF 60 to 65%. Trivial mitral valve regurgitation.     Neuro/Psych negative psych ROS   GI/Hepatic GERD  Controlled,(+)     substance abuse  alcohol use,   Endo/Other   Pre-DM   Renal/GU negative Renal ROS     Musculoskeletal  (+) Arthritis ,  PMR    Abdominal   Peds  Hematology  On eliquis    Anesthesia Other Findings Covid test negative   Reproductive/Obstetrics                            Anesthesia Physical Anesthesia Plan  ASA: III  Anesthesia Plan: General   Post-op Pain Management:    Induction: Intravenous  PONV Risk Score and Plan: 3 and Treatment may vary due to age or medical condition, Ondansetron and Dexamethasone  Airway Management Planned: Oral ETT and Video Laryngoscope Planned  Additional Equipment: None  Intra-op Plan:   Post-operative Plan: Extubation in OR  Informed Consent: I have reviewed the patients History and Physical, chart, labs and discussed the procedure including the risks, benefits and alternatives for the proposed anesthesia with the patient or authorized representative who has indicated his/her understanding and acceptance.     Dental advisory given  Plan Discussed with: CRNA  and Anesthesiologist  Anesthesia Plan Comments: (Reported anaphylactic reaction to Xylocaine when used for shoulder injection > 10 years ago, but says he later received marcaine by neurosurgeon Dr. Ellene Route without known reaction.   History of alpha gal allergy in 2015, however previous notes state he has been able to eat red meat for 2+ years now without reaction. )     Anesthesia Quick Evaluation

## 2019-11-10 ENCOUNTER — Ambulatory Visit (HOSPITAL_COMMUNITY): Payer: PPO | Admitting: Anesthesiology

## 2019-11-10 ENCOUNTER — Encounter (HOSPITAL_COMMUNITY): Payer: Self-pay | Admitting: Neurological Surgery

## 2019-11-10 ENCOUNTER — Observation Stay (HOSPITAL_COMMUNITY)
Admission: RE | Admit: 2019-11-10 | Discharge: 2019-11-11 | Disposition: A | Discharge status: Home / Self Care | Payer: PPO | Attending: Neurological Surgery | Admitting: Neurological Surgery

## 2019-11-10 ENCOUNTER — Encounter (HOSPITAL_COMMUNITY)
Admission: RE | Disposition: A | Discharge status: Home / Self Care | Payer: Self-pay | Source: Home / Self Care | Attending: Neurological Surgery

## 2019-11-10 ENCOUNTER — Ambulatory Visit (HOSPITAL_COMMUNITY): Payer: PPO | Admitting: Vascular Surgery

## 2019-11-10 ENCOUNTER — Ambulatory Visit (HOSPITAL_COMMUNITY): Payer: PPO

## 2019-11-10 ENCOUNTER — Other Ambulatory Visit: Payer: Self-pay

## 2019-11-10 DIAGNOSIS — G4730 Sleep apnea, unspecified: Secondary | ICD-10-CM | POA: Diagnosis not present

## 2019-11-10 DIAGNOSIS — M5011 Cervical disc disorder with radiculopathy,  high cervical region: Secondary | ICD-10-CM | POA: Diagnosis not present

## 2019-11-10 DIAGNOSIS — M5001 Cervical disc disorder with myelopathy,  high cervical region: Secondary | ICD-10-CM | POA: Insufficient documentation

## 2019-11-10 DIAGNOSIS — Z7901 Long term (current) use of anticoagulants: Secondary | ICD-10-CM | POA: Diagnosis not present

## 2019-11-10 DIAGNOSIS — M353 Polymyalgia rheumatica: Secondary | ICD-10-CM | POA: Diagnosis not present

## 2019-11-10 DIAGNOSIS — I10 Essential (primary) hypertension: Secondary | ICD-10-CM | POA: Insufficient documentation

## 2019-11-10 DIAGNOSIS — K219 Gastro-esophageal reflux disease without esophagitis: Secondary | ICD-10-CM | POA: Diagnosis not present

## 2019-11-10 DIAGNOSIS — I480 Paroxysmal atrial fibrillation: Secondary | ICD-10-CM | POA: Diagnosis not present

## 2019-11-10 DIAGNOSIS — M4712 Other spondylosis with myelopathy, cervical region: Principal | ICD-10-CM | POA: Diagnosis present

## 2019-11-10 DIAGNOSIS — R7303 Prediabetes: Secondary | ICD-10-CM | POA: Diagnosis not present

## 2019-11-10 DIAGNOSIS — M4722 Other spondylosis with radiculopathy, cervical region: Secondary | ICD-10-CM | POA: Insufficient documentation

## 2019-11-10 DIAGNOSIS — M4322 Fusion of spine, cervical region: Secondary | ICD-10-CM | POA: Diagnosis not present

## 2019-11-10 DIAGNOSIS — J8410 Pulmonary fibrosis, unspecified: Secondary | ICD-10-CM | POA: Insufficient documentation

## 2019-11-10 DIAGNOSIS — Z419 Encounter for procedure for purposes other than remedying health state, unspecified: Secondary | ICD-10-CM

## 2019-11-10 LAB — GLUCOSE, CAPILLARY
Glucose-Capillary: 107 mg/dL — ABNORMAL HIGH (ref 70–99)
Glucose-Capillary: 108 mg/dL — ABNORMAL HIGH (ref 70–99)

## 2019-11-10 SURGERY — ANTERIOR CERVICAL DECOMPRESSION/DISCECTOMY FUSION 1 LEVEL
Anesthesia: General

## 2019-11-10 MED ORDER — ACETAMINOPHEN 325 MG PO TABS
650.0000 mg | ORAL_TABLET | ORAL | Status: DC | PRN
  Administered 2019-11-11: 650 mg via ORAL
  Filled 2019-11-10: qty 2

## 2019-11-10 MED ORDER — BISACODYL 10 MG RE SUPP: 10.0000 mg | Freq: Every day | RECTAL | Status: DC | PRN

## 2019-11-10 MED ORDER — PROPOFOL 10 MG/ML IV BOLUS
INTRAVENOUS | Status: DC | PRN
  Administered 2019-11-10: 150 mg via INTRAVENOUS

## 2019-11-10 MED ORDER — METHOCARBAMOL 1000 MG/10ML IJ SOLN
500.0000 mg | Freq: Four times a day (QID) | INTRAVENOUS | Status: DC | PRN
  Filled 2019-11-10: qty 5

## 2019-11-10 MED ORDER — DILTIAZEM HCL ER COATED BEADS 120 MG PO CP24
120.0000 mg | ORAL_CAPSULE | Freq: Every day | ORAL | Status: DC
  Administered 2019-11-11: 120 mg via ORAL
  Filled 2019-11-10 (×2): qty 1

## 2019-11-10 MED ORDER — ACETAMINOPHEN 160 MG/5ML PO SOLN: 1000.0000 mg | Freq: Once | ORAL | Status: DC | PRN

## 2019-11-10 MED ORDER — ORAL CARE MOUTH RINSE
15.0000 mL | Freq: Once | OROMUCOSAL | Status: AC
  Administered 2019-11-10: 15 mL via OROMUCOSAL

## 2019-11-10 MED ORDER — SODIUM CHLORIDE 0.9% FLUSH: 3.0000 mL | INTRAVENOUS | Status: DC | PRN

## 2019-11-10 MED ORDER — METOPROLOL TARTRATE 25 MG PO TABS
25.0000 mg | ORAL_TABLET | Freq: Two times a day (BID) | ORAL | Status: DC
  Administered 2019-11-10 – 2019-11-11 (×2): 25 mg via ORAL
  Filled 2019-11-10 (×2): qty 1

## 2019-11-10 MED ORDER — TAMSULOSIN HCL 0.4 MG PO CAPS
0.4000 mg | ORAL_CAPSULE | Freq: Two times a day (BID) | ORAL | Status: DC
  Administered 2019-11-10 – 2019-11-11 (×2): 0.4 mg via ORAL
  Filled 2019-11-10 (×2): qty 1

## 2019-11-10 MED ORDER — OXYCODONE-ACETAMINOPHEN 5-325 MG PO TABS
1.0000 | ORAL_TABLET | ORAL | Status: DC | PRN
  Administered 2019-11-10: 2 via ORAL
  Filled 2019-11-10: qty 2

## 2019-11-10 MED ORDER — ALBUTEROL SULFATE HFA 108 (90 BASE) MCG/ACT IN AERS: 2.0000 | INHALATION_SPRAY | Freq: Four times a day (QID) | RESPIRATORY_TRACT | Status: DC | PRN

## 2019-11-10 MED ORDER — SODIUM CHLORIDE 0.9% FLUSH
3.0000 mL | Freq: Two times a day (BID) | INTRAVENOUS | Status: DC
  Administered 2019-11-10: 3 mL via INTRAVENOUS

## 2019-11-10 MED ORDER — FENTANYL CITRATE (PF) 100 MCG/2ML IJ SOLN
INTRAMUSCULAR | Status: AC
  Filled 2019-11-10: qty 2

## 2019-11-10 MED ORDER — ROCURONIUM BROMIDE 10 MG/ML (PF) SYRINGE
PREFILLED_SYRINGE | INTRAVENOUS | Status: AC
  Filled 2019-11-10: qty 20

## 2019-11-10 MED ORDER — SODIUM CHLORIDE 0.9 % IV SOLN: 250.0000 mL | INTRAVENOUS | Status: DC

## 2019-11-10 MED ORDER — CHLORHEXIDINE GLUCONATE 0.12 % MT SOLN
15.0000 mL | Freq: Once | OROMUCOSAL | Status: AC
  Filled 2019-11-10: qty 15

## 2019-11-10 MED ORDER — FENTANYL CITRATE (PF) 100 MCG/2ML IJ SOLN
25.0000 ug | INTRAMUSCULAR | Status: DC | PRN
  Administered 2019-11-10 (×2): 50 ug via INTRAVENOUS

## 2019-11-10 MED ORDER — ONDANSETRON HCL 4 MG PO TABS: 4.0000 mg | ORAL_TABLET | Freq: Three times a day (TID) | ORAL | Status: DC | PRN

## 2019-11-10 MED ORDER — LIDOCAINE-EPINEPHRINE 1 %-1:100000 IJ SOLN
INTRAMUSCULAR | Status: AC
  Filled 2019-11-10: qty 1

## 2019-11-10 MED ORDER — MORPHINE SULFATE (PF) 2 MG/ML IV SOLN
2.0000 mg | INTRAVENOUS | Status: DC | PRN
  Administered 2019-11-11: 2 mg via INTRAVENOUS
  Filled 2019-11-10: qty 1

## 2019-11-10 MED ORDER — DOCUSATE SODIUM 100 MG PO CAPS
100.0000 mg | ORAL_CAPSULE | Freq: Two times a day (BID) | ORAL | Status: DC
  Administered 2019-11-10 – 2019-11-11 (×2): 100 mg via ORAL
  Filled 2019-11-10 (×2): qty 1

## 2019-11-10 MED ORDER — PROPOFOL 10 MG/ML IV BOLUS
INTRAVENOUS | Status: AC
  Filled 2019-11-10: qty 20

## 2019-11-10 MED ORDER — ACETAMINOPHEN 650 MG RE SUPP: 650.0000 mg | RECTAL | Status: DC | PRN

## 2019-11-10 MED ORDER — SENNA 8.6 MG PO TABS
1.0000 | ORAL_TABLET | Freq: Two times a day (BID) | ORAL | Status: DC
  Administered 2019-11-10 – 2019-11-11 (×2): 8.6 mg via ORAL
  Filled 2019-11-10 (×2): qty 1

## 2019-11-10 MED ORDER — METHOCARBAMOL 500 MG PO TABS
500.0000 mg | ORAL_TABLET | Freq: Four times a day (QID) | ORAL | Status: DC | PRN
  Administered 2019-11-10: 500 mg via ORAL
  Filled 2019-11-10: qty 1

## 2019-11-10 MED ORDER — ACETAMINOPHEN 10 MG/ML IV SOLN: 1000.0000 mg | Freq: Once | INTRAVENOUS | Status: DC | PRN

## 2019-11-10 MED ORDER — UMECLIDINIUM-VILANTEROL 62.5-25 MCG/INH IN AEPB
1.0000 | INHALATION_SPRAY | Freq: Every day | RESPIRATORY_TRACT | Status: DC
  Administered 2019-11-11: 1 via RESPIRATORY_TRACT
  Filled 2019-11-10: qty 14

## 2019-11-10 MED ORDER — CEFAZOLIN SODIUM-DEXTROSE 2-4 GM/100ML-% IV SOLN
2.0000 g | INTRAVENOUS | Status: AC
  Administered 2019-11-10: 2 g via INTRAVENOUS
  Filled 2019-11-10: qty 100

## 2019-11-10 MED ORDER — THROMBIN 5000 UNITS EX SOLR
CUTANEOUS | Status: AC
  Filled 2019-11-10: qty 5000

## 2019-11-10 MED ORDER — LACTATED RINGERS IV SOLN: INTRAVENOUS | Status: DC | PRN

## 2019-11-10 MED ORDER — GABAPENTIN 300 MG PO CAPS
300.0000 mg | ORAL_CAPSULE | Freq: Three times a day (TID) | ORAL | Status: DC
  Administered 2019-11-10 – 2019-11-11 (×2): 300 mg via ORAL
  Filled 2019-11-10 (×2): qty 1

## 2019-11-10 MED ORDER — MENTHOL 3 MG MT LOZG: 1.0000 | LOZENGE | OROMUCOSAL | Status: DC | PRN

## 2019-11-10 MED ORDER — BUPIVACAINE HCL (PF) 0.5 % IJ SOLN
INTRAMUSCULAR | Status: AC
  Filled 2019-11-10: qty 30

## 2019-11-10 MED ORDER — DIPHENOXYLATE-ATROPINE 2.5-0.025 MG PO TABS: 1.0000 | ORAL_TABLET | Freq: Four times a day (QID) | ORAL | Status: DC | PRN

## 2019-11-10 MED ORDER — PHENOL 1.4 % MT LIQD: 1.0000 | OROMUCOSAL | Status: DC | PRN

## 2019-11-10 MED ORDER — FLUTICASONE PROPIONATE 50 MCG/ACT NA SUSP: 2.0000 | Freq: Every day | NASAL | Status: DC | PRN

## 2019-11-10 MED ORDER — HYDROCODONE-ACETAMINOPHEN 7.5-325 MG PO TABS: 1.0000 | ORAL_TABLET | Freq: Once | ORAL | Status: DC | PRN

## 2019-11-10 MED ORDER — BUPIVACAINE HCL (PF) 0.5 % IJ SOLN
INTRAMUSCULAR | Status: DC | PRN
  Administered 2019-11-10: 8 mL

## 2019-11-10 MED ORDER — FENTANYL CITRATE (PF) 250 MCG/5ML IJ SOLN
INTRAMUSCULAR | Status: AC
  Filled 2019-11-10: qty 5

## 2019-11-10 MED ORDER — 0.9 % SODIUM CHLORIDE (POUR BTL) OPTIME
TOPICAL | Status: DC | PRN
  Administered 2019-11-10: 1000 mL

## 2019-11-10 MED ORDER — FLEET ENEMA 7-19 GM/118ML RE ENEM: 1.0000 | ENEMA | Freq: Once | RECTAL | Status: DC | PRN

## 2019-11-10 MED ORDER — PHENYLEPHRINE HCL-NACL 10-0.9 MG/250ML-% IV SOLN
INTRAVENOUS | Status: DC | PRN
Start: 2019-11-10 — End: 2019-11-10
  Administered 2019-11-10: 50 ug/min via INTRAVENOUS

## 2019-11-10 MED ORDER — FENTANYL CITRATE (PF) 100 MCG/2ML IJ SOLN
INTRAMUSCULAR | Status: DC | PRN
  Administered 2019-11-10: 50 ug via INTRAVENOUS
  Administered 2019-11-10: 150 ug via INTRAVENOUS
  Administered 2019-11-10: 50 ug via INTRAVENOUS

## 2019-11-10 MED ORDER — SODIUM CHLORIDE 0.9 % IV SOLN
INTRAVENOUS | Status: DC | PRN
  Administered 2019-11-10: 500 mL

## 2019-11-10 MED ORDER — PHENYLEPHRINE 40 MCG/ML (10ML) SYRINGE FOR IV PUSH (FOR BLOOD PRESSURE SUPPORT)
PREFILLED_SYRINGE | INTRAVENOUS | Status: AC
  Filled 2019-11-10: qty 20

## 2019-11-10 MED ORDER — POLYETHYLENE GLYCOL 3350 17 G PO PACK: 17.0000 g | PACK | Freq: Every day | ORAL | Status: DC | PRN

## 2019-11-10 MED ORDER — ROCURONIUM BROMIDE 100 MG/10ML IV SOLN
INTRAVENOUS | Status: DC | PRN
  Administered 2019-11-10: 10 mg via INTRAVENOUS
  Administered 2019-11-10: 60 mg via INTRAVENOUS

## 2019-11-10 MED ORDER — LORATADINE 10 MG PO TABS
10.0000 mg | ORAL_TABLET | Freq: Every day | ORAL | Status: DC
  Administered 2019-11-11: 10 mg via ORAL
  Filled 2019-11-10: qty 1

## 2019-11-10 MED ORDER — LACTATED RINGERS IV SOLN: INTRAVENOUS | Status: DC

## 2019-11-10 MED ORDER — PREDNISONE 5 MG PO TABS
5.0000 mg | ORAL_TABLET | Freq: Every day | ORAL | Status: DC
  Administered 2019-11-11: 5 mg via ORAL
  Filled 2019-11-10: qty 1

## 2019-11-10 MED ORDER — THROMBIN 5000 UNITS EX SOLR: OROMUCOSAL | Status: DC | PRN

## 2019-11-10 MED ORDER — DEXAMETHASONE SODIUM PHOSPHATE 10 MG/ML IJ SOLN
INTRAMUSCULAR | Status: DC | PRN
Start: 2019-11-10 — End: 2019-11-10
  Administered 2019-11-10: 10 mg via INTRAVENOUS

## 2019-11-10 MED ORDER — ONDANSETRON HCL 4 MG/2ML IJ SOLN
INTRAMUSCULAR | Status: AC
  Filled 2019-11-10: qty 2

## 2019-11-10 MED ORDER — ONDANSETRON HCL 4 MG/2ML IJ SOLN
4.0000 mg | Freq: Four times a day (QID) | INTRAMUSCULAR | Status: DC | PRN
  Administered 2019-11-10: 4 mg via INTRAVENOUS
  Filled 2019-11-10: qty 2

## 2019-11-10 MED ORDER — DEXAMETHASONE SODIUM PHOSPHATE 10 MG/ML IJ SOLN
INTRAMUSCULAR | Status: AC
  Filled 2019-11-10: qty 1

## 2019-11-10 MED ORDER — SUGAMMADEX SODIUM 200 MG/2ML IV SOLN
INTRAVENOUS | Status: DC | PRN
Start: 2019-11-10 — End: 2019-11-10
  Administered 2019-11-10: 200 mg via INTRAVENOUS

## 2019-11-10 MED ORDER — ACETAMINOPHEN 500 MG PO TABS: 1000.0000 mg | ORAL_TABLET | Freq: Once | ORAL | Status: DC | PRN

## 2019-11-10 MED ORDER — EZETIMIBE 10 MG PO TABS
10.0000 mg | ORAL_TABLET | Freq: Every day | ORAL | Status: DC
  Administered 2019-11-10: 10 mg via ORAL
  Filled 2019-11-10 (×2): qty 1

## 2019-11-10 MED ORDER — LIDOCAINE HCL (CARDIAC) PF 100 MG/5ML IV SOSY: PREFILLED_SYRINGE | INTRAVENOUS | Status: DC | PRN

## 2019-11-10 MED ORDER — CEFAZOLIN SODIUM-DEXTROSE 2-4 GM/100ML-% IV SOLN
2.0000 g | Freq: Three times a day (TID) | INTRAVENOUS | Status: AC
  Administered 2019-11-10 – 2019-11-11 (×2): 2 g via INTRAVENOUS
  Filled 2019-11-10 (×2): qty 100

## 2019-11-10 SURGICAL SUPPLY — 51 items
ADH SKN CLS APL DERMABOND .7 (GAUZE/BANDAGES/DRESSINGS) ×1
ADH SKN CLS LQ APL DERMABOND (GAUZE/BANDAGES/DRESSINGS) ×1
BAG DECANTER FOR FLEXI CONT (MISCELLANEOUS) ×2 IMPLANT
BAND INSRT 18 STRL LF DISP RB (MISCELLANEOUS) ×2
BAND RUBBER #18 3X1/16 STRL (MISCELLANEOUS) ×2 IMPLANT
BIT DRILL ACP 13 (DRILL) ×1 IMPLANT
BIT DRILL NEURO 2X3.1 SFT TUCH (MISCELLANEOUS) ×1 IMPLANT
BNDG GAUZE ELAST 4 BULKY (GAUZE/BANDAGES/DRESSINGS) IMPLANT
BONE MATRIX OSTEOCEL PRO SM (Bone Implant) ×2 IMPLANT
BUR BARREL STRAIGHT FLUTE 4.0 (BURR) ×1 IMPLANT
CAGE CERV MOD 7X17X14 7D (Cage) ×1 IMPLANT
CANISTER SUCT 3000ML PPV (MISCELLANEOUS) ×2 IMPLANT
COVER WAND RF STERILE (DRAPES) IMPLANT
DECANTER SPIKE VIAL GLASS SM (MISCELLANEOUS) ×2 IMPLANT
DERMABOND ADHESIVE PROPEN (GAUZE/BANDAGES/DRESSINGS) ×1
DERMABOND ADVANCED (GAUZE/BANDAGES/DRESSINGS) ×1
DERMABOND ADVANCED .7 DNX12 (GAUZE/BANDAGES/DRESSINGS) ×1 IMPLANT
DERMABOND ADVANCED .7 DNX6 (GAUZE/BANDAGES/DRESSINGS) ×1 IMPLANT
DRAPE LAPAROTOMY 100X72 PEDS (DRAPES) ×2 IMPLANT
DRAPE MICROSCOPE LEICA (MISCELLANEOUS) ×2 IMPLANT
DRILL ACP 13 (DRILL) ×2
DRILL NEURO 2X3.1 SOFT TOUCH (MISCELLANEOUS) ×2
DURAPREP 6ML APPLICATOR 50/CS (WOUND CARE) ×2 IMPLANT
ELECT REM PT RETURN 9FT ADLT (ELECTROSURGICAL) ×2
ELECTRODE REM PT RTRN 9FT ADLT (ELECTROSURGICAL) ×1 IMPLANT
GAUZE 4X4 16PLY RFD (DISPOSABLE) IMPLANT
GLOVE BIOGEL PI IND STRL 8.5 (GLOVE) ×1 IMPLANT
GLOVE BIOGEL PI INDICATOR 8.5 (GLOVE) ×1
GLOVE ECLIPSE 8.5 STRL (GLOVE) ×2 IMPLANT
GOWN STRL REUS W/ TWL LRG LVL3 (GOWN DISPOSABLE) IMPLANT
GOWN STRL REUS W/ TWL XL LVL3 (GOWN DISPOSABLE) ×1 IMPLANT
GOWN STRL REUS W/TWL 2XL LVL3 (GOWN DISPOSABLE) ×2 IMPLANT
GOWN STRL REUS W/TWL LRG LVL3 (GOWN DISPOSABLE)
GOWN STRL REUS W/TWL XL LVL3 (GOWN DISPOSABLE) ×2
HALTER HD/CHIN CERV TRACTION D (MISCELLANEOUS) ×2 IMPLANT
HEMOSTAT POWDER KIT SURGIFOAM (HEMOSTASIS) ×2 IMPLANT
KIT BASIN OR (CUSTOM PROCEDURE TRAY) ×2 IMPLANT
NDL SPNL 22GX3.5 QUINCKE BK (NEEDLE) ×1 IMPLANT
NEEDLE HYPO 22GX1.5 SAFETY (NEEDLE) ×2 IMPLANT
NEEDLE SPNL 22GX3.5 QUINCKE BK (NEEDLE) ×2 IMPLANT
NS IRRIG 1000ML POUR BTL (IV SOLUTION) ×2 IMPLANT
PACK LAMINECTOMY NEURO (CUSTOM PROCEDURE TRAY) ×2 IMPLANT
PAD ARMBOARD 7.5X6 YLW CONV (MISCELLANEOUS) ×6 IMPLANT
PLATE ACP 1-LEVEL1.6V22 (Plate) ×1 IMPLANT
SCREW ACP VA ST 3.5X15 (Screw) ×4 IMPLANT
SET WALTER ACTIVATION W/DRAPE (SET/KITS/TRAYS/PACK) ×1 IMPLANT
SPONGE INTESTINAL PEANUT (DISPOSABLE) ×2 IMPLANT
SUT VIC AB 4-0 RB1 18 (SUTURE) ×4 IMPLANT
TOWEL GREEN STERILE (TOWEL DISPOSABLE) ×2 IMPLANT
TOWEL GREEN STERILE FF (TOWEL DISPOSABLE) ×2 IMPLANT
WATER STERILE IRR 1000ML POUR (IV SOLUTION) ×2 IMPLANT

## 2019-11-10 NOTE — Anesthesia Postprocedure Evaluation (Signed)
Anesthesia Post Note  Patient: [name]  Procedure(s) Performed: Cervical Three-Four Anterior cervical decompression/discectomy/fusion (N/A )     Patient location during evaluation: PACU Anesthesia Type: General Level of consciousness: awake and alert Pain management: pain level controlled Vital Signs Assessment: post-procedure vital signs reviewed and stable Respiratory status: spontaneous breathing, nonlabored ventilation, respiratory function stable and patient connected to nasal cannula oxygen Cardiovascular status: blood pressure returned to baseline and stable Postop Assessment: no apparent nausea or vomiting Anesthetic complications: no   No complications documented.  Last Vitals:  Vitals:   11/10/19 1954 11/10/19 1957  BP: (!) 141/81 (!) 141/81  Pulse: 81 84  Resp: 20 20  Temp: 36.7 C 36.7 C  SpO2: 93% 100%    Last Pain:  Vitals:   11/10/19 1957  TempSrc: Oral  PainSc:                  Algis Lehenbauer

## 2019-11-10 NOTE — H&P (Signed)
[name] is an 74 y.o. male.   Chief Complaint: Neck shoulder and right arm pain weakness HPI: [name] is a 74 year old individual whose had significant symptoms in his low back and his neurologic he has advanced spondylitic degenerative changes in the lower lumbar spine consisting compression fracture of the lumbar vertebrae he is also had cervical spondylitic disease and here more recently has developed increasing neck pain shoulder pain and right arm pain is been some weakness in the upper extremities that he describes rather quickly but because of the neck pain has been progressing an MRI was recently performed.  This demonstrates presence of a central disc herniation at C3-C4 with advanced degenerative change C3-C4 causing cord compression at that level.  He was advised regarding the need for surgical decompression and stabilization.  Past Medical History:  Diagnosis Date  . Allergy to alpha-gal    2015; has been able to re-introduce red meat for the past 2 1/2 years without reaction (as of 01/21/19)  . Arthritis   . Chest pain    a. 03/2017: echo showing EF of 60-65%, no regional WMA or significant valve abnormalities. b. 03/2016: NST with no evidence of ischemia.   . Complication of anesthesia    "anaphylactic" reaction after given xylocaine for shoulder injection > 10 years ago; reported no known reaction when given marcaine (as of 01/21/19)   . Digestive problems    on Prednisone prn for this issue- diarrhea  . Dyspnea    with activity  . Dysrhythmia    irregular due to Myocarditis- takes Atenolol, Norvasc  . GERD (gastroesophageal reflux disease)   . H/O viral myocarditis    25 years ago  . Head injury, closed, with concussion    Bre[place] LOC  . Hyperlipidemia   . Mild mitral valve prolapse    per Dr Evette Georges notes  . PAF (paroxysmal atrial fibrillation) ([place])    a. initially occuring in 02/2016. b. recurrent in 07/2016. Placed on Eliquis  . Polymyalgia rheumatica  ([place])   . Pre-diabetes   . Pulmonary fibrosis ([place])   . Sleep apnea    mild sleep apnea   no CPAP    Past Surgical History:  Procedure Laterality Date  . apendectomy  1965  . APPENDECTOMY    . BACK SURGERY    . bone spur Bilateral 1999   feet  . CARDIAC CATHETERIZATION  2001  . CHOLECYSTECTOMY  2004  . COLON RESECTION N/A 06/15/2018   Procedure: SIGMOID COLON RESECTION;  Surgeon: [name], MD;  Location: [facility];  Service: General;  Laterality: N/A;  . COLONOSCOPY    . COLOSTOMY N/A 06/15/2018   Procedure: COLOSTOMY;  Surgeon: [name], MD;  Location: [facility];  Service: General;  Laterality: N/A;  . COLOSTOMY TAKEDOWN N/A 01/26/2019   Procedure: LAPAROSCOPIC ASSISTED COLOSTOMY TAKEDOWN;  Surgeon: [name], MD;  Location: [facility];  Service: General;  Laterality: N/A;  . EYE SURGERY Bilateral    cataract removal  . LAPAROSCOPIC LYSIS OF ADHESIONS N/A 01/26/2019   Procedure: LAPAROSCOPIC LYSIS OF ADHESIONS;  Surgeon: [name], MD;  Location: [facility];  Service: General;  Laterality: N/A;  . LAPAROTOMY N/A 06/15/2018   Procedure: EXPLORATORY LAPAROTOMY;  Surgeon: [name], MD;  Location: [facility];  Service: General;  Laterality: N/A;  . LUMBAR LAMINECTOMY/ DECOMPRESSION WITH MET-RX Right 05/05/2013   Procedure: Right Lumbar three-four Extraforaminal Microdiskectomy with Metrex;  Surgeon: [name], MD;  Location: [facility];  Service: Neurosurgery;  Laterality: Right;  Right Lumbar three-four Extraforaminal Microdiskectomy with Metrex  . LUNG BIOPSY Right 02/19/2018   Procedure: LUNG BIOPSY;  Surgeon: [name], MD;  Location: [facility];  Service: Thoracic;  Laterality: Right;  . SHOULDER OPEN ROTATOR CUFF REPAIR Bilateral 2001  . TONSILLECTOMY    . VIDEO ASSISTED THORACOSCOPY Right 02/19/2018   Procedure: VIDEO ASSISTED THORACOSCOPY;  Surgeon: [name], MD;  Location[facility];  Service: Thoracic;  Laterality: Right;    Family History  Problem  Relation Age of Onset  . Rheum arthritis Mother   . Thyroid cancer Mother   . Heart disease Father   . Asthma Father   . Thyroid cancer Sister   . Melanoma Brother    Social History:  reports that he has never smoked. He has never used smokeless tobacco. He reports current alcohol use of about 21.0 standard drinks of alcohol per week. He reports that he does not use drugs.  Allergies:  Allergies  Allergen Reactions  . Xylocaine [Lidocaine] Anaphylaxis  . Codeine Nausea And Vomiting  . Statins Other (See Comments)    Muscle soreness and an aching feeling all over Myalgias  . Percocet [Oxycodone-Acetaminophen] Itching    No medications prior to admission.    No results found for this or any previous visit (from the past 48 hour(s)). No results found.  Review of Systems  Constitutional: Positive for fatigue.  HENT: Negative.   Eyes: Negative.   Respiratory: Negative.   Cardiovascular: Negative.   Gastrointestinal: Negative.   Endocrine: Negative.   Genitourinary: Negative.   Musculoskeletal: Positive for back pain, neck pain and neck stiffness.  Allergic/Immunologic: Negative.   Neurological: Positive for weakness and numbness.  Hematological: Negative.   Psychiatric/Behavioral: Negative.     There were no vitals taken for this visit. Physical Exam Constitutional:      Appearance: Normal appearance. He is normal weight.  HENT:     Head: Normocephalic and atraumatic.     Right Ear: Tympanic membrane normal.     Left Ear: Tympanic membrane normal.     Nose: Nose normal.     Mouth/Throat:     Mouth: Mucous membranes are moist.     Pharynx: Oropharynx is clear.  Eyes:     Extraocular Movements: Extraocular movements intact.     Pupils: Pupils are equal, round, and reactive to light.  Neck:     Comments: Positive Spurling test on the right Cardiovascular:     Rate and Rhythm: Normal rate and regular rhythm.     Pulses: Normal pulses.     Heart sounds: Normal  heart sounds.  Pulmonary:     Effort: Pulmonary effort is normal.     Breath sounds: Normal breath sounds.  Abdominal:     General: Abdomen is flat.     Palpations: Abdomen is soft.  Musculoskeletal:     Cervical back: Rigidity present.     Comments: Decreased range of motion of the neck turning only 45 degrees correcting to the right flexing and extending less than 50% of normal  Skin:    General: Skin is warm and dry.     Capillary Refill: Capillary refill takes less than 2 seconds.  Neurological:     Mental Status: He is alert.     Comments: Mild weakness in the deltoid more on the right.  Therefore bicep strength is intact intrinsic strength is intact triceps is normal deep tendon reflexes are absent in  the biceps and triceps.  1+ in the patellae absent Achilles.  Cranial nerve examination is within limits of normal Station and gait revealed patient walks with a slight forward stoop approximately 10 degrees proximal and distal motor strength is intact.  Psychiatric:        Mood and Affect: Mood normal.        Behavior: Behavior normal.      Assessment/Plan Spondylosis and stenosis with myelopathy C3-C4.  Plan anterior cervical decompression arthrodesis C3-C4.  Anterior plate fixation.  Earleen Newport, MD 11/10/2019, 10:18 AM

## 2019-11-10 NOTE — Transfer of Care (Signed)
Immediate Anesthesia Transfer of Care Note  Patient: Eilam Sh[place]sbury Horrell  Procedure(s) Performed: Cervical Three-Four Anterior cervical decompression/discectomy/fusion (N/A )  Patient Location: PACU  Anesthesia Type:General  Level of Consciousness: awake and alert   Airway & Oxygen Therapy: Patient Spontanous Breathing and Patient connected to nasal cannula oxygen  Post-op Assessment: Report given to RN and Post -op Vital signs reviewed and stable  Post vital signs: Reviewed and stable  Last Vitals:  Vitals Value Taken Time  BP 121/74 11/10/19 1842  Temp    Pulse 85 11/10/19 1849  Resp 18 11/10/19 1851  SpO2 95 % 11/10/19 1849  Vitals shown include unvalidated device data.  Last Pain:  Vitals:   11/10/19 1342  TempSrc:   PainSc: 0-No pain         Complications: No complications documented.

## 2019-11-10 NOTE — Anesthesia Procedure Notes (Signed)
Procedure Name: Intubation Date/Time: 11/10/2019 4:35 PM Performed by: Eligha Bridegroom, CRNA Pre-anesthesia Checklist: Patient identified, Emergency Drugs available, Suction available, Patient being monitored and Timeout performed Patient Re-evaluated:Patient Re-evaluated prior to induction Oxygen Delivery Method: Circle system utilized Preoxygenation: Pre-oxygenation with 100% oxygen Induction Type: IV induction Ventilation: Mask ventilation without difficulty Laryngoscope Size: Glidescope Grade View: Grade I Tube type: Oral Tube size: 7.5 mm Number of attempts: 1 Airway Equipment and Method: Stylet and Video-laryngoscopy Placement Confirmation: positive ETCO2 and breath sounds checked- equal and bilateral Secured at: 21 cm Tube secured with: Tape Dental Injury: Teeth and Oropharynx as per pre-operative assessment

## 2019-11-10 NOTE — Op Note (Signed)
Date of surgery: 11/10/2019 Preoperative diagnosis: Cervical spondylosis with myelopathy C3-C4, cervical radiculopathy. Postoperative diagnosis: Same Procedure: Anterior cervical decompression C3-C4 arthrodesis with structural allograft anterior cervical plate fixation [id number] Surgeon: [name] Anesthesia: General endotracheal Indications: [name] is a 74 year old individuals had significant neck shoulder and right arm pain an MRI was recently performed demonstrated presence of a large central disc herniation at C3-C4 with cord flattening by foraminal stenosis after careful consideration I advised surgical decompression of this 1 level.  He is now admitted for that surgery.  Procedure: Patient was brought to the operating room placed on table supine position.  After the smooth induction of general endotracheal anesthesia he was placed in 5 pounds full traction neck was prepped with alcohol DuraPrep and draped in a sterile fashion.  A transverse incision was made in the superficial portion of the neck near the C3-4 level and carried down through the platysma the plane between the sternocleidomastoid and the strap muscles was dissected bluntly until the prevertebral space was reached first identifiable disc space was noted to be that of C3-C4 on localizing radiograph.  Then using a retractor system and the Caspar arm we were able to establish a plane on either side of the longus coli muscle at C3-C4 and place adequate retractors.  Anterior border of the disc space was opened with a #15 blade and a combination of curettes and rongeurs was used to evacuate a moderate amount of disc material and also significant osteophytes from the ventral aspect of the disc space.  The disc space was then carefully entered and gradually at this space was drilled down to the posterior aspect where there is a large osteophyte overhanging the posterior edge of C3.  This was drilled down and removed in a piecemeal fashion  then the superior edge of C4 was similarly undercut using a 2 mm Kerrison punch.  The ligament was largely allowed to remain intact but was decompressed by removal of all the bony overgrowth.  With this the decompression was carried out into the foramen where an adequate decompression was performed here the interspace then had the edges of the endplates smooth and ultimately was felt that a 7 mm spacer measuring 17 x 14 mm in height with 7 degrees of lordosis would fit best into this interval this was filled with some of the patient's autologous bone in addition to small aliquot of osteocell.  Ostia cell is allograft.  The spacer was tamped into position and then a 22 mm ACP plate was fitted to the ventral aspect of the vertebral body with 4 variable angled 3-1/2 x 15 mm screws.  Once the screws were locked in the position final radiograph was obtained this verified good position and then the retractors were removed and the wound was checked for hemostasis when hemostasis was verified the autism was closed with 4-0 Vicryl in interrupted fashion 4-0 Vicryl was used in a subcuticular skin Dermabond was placed on the skin blood loss is estimated 50 cc.

## 2019-11-11 ENCOUNTER — Encounter (HOSPITAL_COMMUNITY): Payer: Self-pay | Admitting: Neurological Surgery

## 2019-11-11 DIAGNOSIS — M4712 Other spondylosis with myelopathy, cervical region: Secondary | ICD-10-CM | POA: Diagnosis not present

## 2019-11-11 MED ORDER — HYDROXYZINE HCL 50 MG/ML IM SOLN
50.0000 mg | Freq: Four times a day (QID) | INTRAMUSCULAR | Status: DC | PRN
  Administered 2019-11-11: 50 mg via INTRAMUSCULAR
  Filled 2019-11-11: qty 1

## 2019-11-11 MED ORDER — DIAZEPAM 5 MG PO TABS
5.0000 mg | ORAL_TABLET | Freq: Four times a day (QID) | ORAL | 0 refills | Status: DC | PRN
Start: 2019-11-11 — End: 2019-11-24

## 2019-11-11 NOTE — Discharge Instructions (Signed)

## 2019-11-11 NOTE — Progress Notes (Signed)
Pt doing well. Pt and wife given D/C instructions with verbal understanding. Rx was sent to the pharmacy by MD. Pt's incision is clean and dry with no sign of infection. Pt's IV was removed prior to D/C. Pt D/C'd home via wheelchair per MD order. Pt is stable @ D/C and has no other needs at this time. [place], RN  

## 2019-11-11 NOTE — Discharge Summary (Signed)
Physician Discharge Summary  [name] MRN: [id number] DOB/AGE: 06/28/1945 74 y.o.  Admit date: 11/10/2019 Discharge date: 11/11/2019  Admission Diagnoses: Cervical spondylosis with myelopathy C3-C4 cervical radiculopathy  Discharge Diagnoses: Cervical spondylosis with myelopathy C3-C4.  Cervical radiculopathy. Active Problems:   Cervical spondylosis with myelopathy   Discharged Condition: good  Hospital Course: Patient was admitted to undergo surgical decompression arthrodesis C3-4 he tolerated surgery well  Consults: None  Significant Diagnostic Studies: None  Treatments: surgery: Anterior cervical decompression C3-C4 arthrodesis with structural allograft anterior plate fixation  Discharge Exam: Blood pressure 127/71, pulse 92, temperature 98.4 F (36.9 C), temperature source Oral, resp. rate 18, height 5\' 8"  (1.727 m), weight 83.9 kg, SpO2 92 %. Incision is clean and dry Station and gait are intact  Disposition: Discharge disposition: 01-Home or Self Care       Discharge Instructions    Call MD for:  redness, tenderness, or signs of infection (pain, swelling, redness, odor or green/yellow discharge around incision site)   Complete by: As directed    Call MD for:  severe uncontrolled pain   Complete by: As directed    Call MD for:  temperature >100.4   Complete by: As directed    Diet - low sodium heart healthy   Complete by: As directed    Discharge wound care:   Complete by: As directed    Okay to shower. Do not apply salves or appointments to incision. No heavy lifting with the upper extremities greater than 15 pounds. May resume driving when not requiring pain medication and patient feels comfortable with doing so.   Incentive spirometry RT   Complete by: As directed    Increase activity slowly   Complete by: As directed      Allergies as of 11/11/2019      Reactions   Xylocaine [lidocaine] Anaphylaxis   Codeine Nausea And Vomiting    Statins Other (See Comments)   Muscle soreness and an aching feeling all over Myalgias   Percocet [oxycodone-acetaminophen] Itching      Medication List    TAKE these medications   acetaminophen 650 MG CR tablet Commonly known as: TYLENOL Take 1,300 mg by mouth every 8 (eight) hours as needed for pain.   albuterol 108 (90 Base) MCG/ACT inhaler Commonly known as: VENTOLIN HFA Inhale 2 puffs into the lungs every 6 (six) hours as needed. What changed: reasons to take this   Anoro Ellipta 62.5-25 MCG/INH Aepb Generic drug: umeclidinium-vilanterol Inhale 1 puff into the lungs daily.   budesonide-formoterol 80-4.5 MCG/ACT inhaler Commonly known as: Symbicort Inhale 2 puffs into the lungs 2 (two) times daily.   Cartia XT 120 MG 24 hr capsule Generic drug: diltiazem TAKE ONE CAPSULE BY MOUTH DAILY What changed: how much to take   cetirizine 10 MG tablet Commonly known as: ZYRTEC Take 10 mg by mouth daily as needed for allergies.   Dialyvite Vitamin D 5000 125 MCG (5000 UT) capsule Generic drug: Cholecalciferol Take 5,000 Units by mouth daily.   cholecalciferol 25 MCG (1000 UNIT) tablet Commonly known as: VITAMIN D3 Take 1,000 Units by mouth every evening.   diazepam 5 MG tablet Commonly known as: Valium Take 1 tablet (5 mg total) by mouth every 6 (six) hours as needed for muscle spasms.   diclofenac Sodium 1 % Gel Commonly known as: VOLTAREN Apply 1 application topically 4 (four) times daily as needed (pain).   diphenoxylate-atropine 2.5-0.025 MG tablet Commonly known as: LOMOTIL Take 1 tablet  by mouth 4 (four) times daily as needed for diarrhea or loose stools.   Eliquis 5 MG Tabs tablet Generic drug: apixaban TAKE ONE TABLET (5MG  TOTAL) BY MOUTH TWODAILY What changed: See the new instructions.   ezetimibe 10 MG tablet Commonly known as: ZETIA Take 10 mg by mouth daily.   Fish Oil 500 MG Caps Take 500 mg by mouth daily.   fluticasone 50 MCG/ACT nasal  spray Commonly known as: FLONASE Place 2 sprays into both nostrils daily as needed for allergies or rhinitis.   gabapentin 300 MG capsule Commonly known as: NEURONTIN Take 300 mg by mouth 3 (three) times daily.   metoprolol tartrate 25 MG tablet Commonly known as: LOPRESSOR TAKE ONE TABLET BY MOUTH TWICE A DAY   mycophenolate 500 MG tablet Commonly known as: CELLCEPT Take 2 tablets (1,000 mg total) by mouth 2 (two) times daily.   nystatin 100000 UNIT/ML suspension Commonly known as: MYCOSTATIN Take 5 mLs (500,000 Units total) by mouth every other day. What changed:   when to take this  reasons to take this   Ofev 150 MG Caps Generic drug: Nintedanib Take 1 capsule (150 mg total) by mouth daily.   ondansetron 4 MG tablet Commonly known as: ZOFRAN Take 4 mg by mouth every 8 (eight) hours as needed for nausea or vomiting.   predniSONE 5 MG tablet Commonly known as: DELTASONE Take 5 mg by mouth daily.   sulfamethoxazole-trimethoprim 800-160 MG tablet Commonly known as: BACTRIM DS 1 Monday Wednesday Friday   tamsulosin 0.4 MG Caps capsule Commonly known as: FLOMAX Take 0.4 mg by mouth 2 (two) times daily.   vitamin C 1000 MG tablet Take 1,000 mg by mouth daily.            Discharge Care Instructions  (From admission, onward)         Start     Ordered   11/11/19 0000  Discharge wound care:       Comments: Okay to shower. Do not apply salves or appointments to incision. No heavy lifting with the upper extremities greater than 15 pounds. May resume driving when not requiring pain medication and patient feels comfortable with doing so.   11/11/19 0914           Signed: Blanchie Dessert Andrea Ferrer 11/11/2019, 9:15 AM

## 2019-11-11 NOTE — Evaluation (Signed)
Physical Therapy Evaluation Patient Details Name: [name] MRN: [id number] DOB: 08/20/1945 Today's Date: 11/11/2019   History of Present Illness  Pt is a 74 y/o male s/p ACDF C3-C4. PMH including but not limited to pulmonary fibrosis and polymyalgia rheumatica.  Clinical Impression  Pt presented standing in room with OT finishing up their session and willing to participate in PT session. Prior to admission, pt was independent with all functional mobility and ADLs. Pt lives with his wife in a single level home with one small step to enter. At the time of evaluation, pt overall moving very well at a mod I to supervision level with all functional mobility including hallway ambulation without the need of an AD. Additionally, pt participated in stair training this session without difficulties. PT educated pt and pt's spouse on cervical precautions and how they relate to mobility in general. All questions were answered at the end of the session. No further acute PT needs identified at this time. PT signing off.     Follow Up Recommendations No PT follow up    Equipment Recommendations  None recommended by PT    Recommendations for Other Services       Precautions / Restrictions Precautions Precautions: Cervical Precaution Comments: reviewed cervical precautions with pt and pt's wife Restrictions Weight Bearing Restrictions: No      Mobility  Bed Mobility Overal bed mobility: Modified Independent                Transfers Overall transfer level: Modified independent Equipment used: None                Ambulation/Gait Ambulation/Gait assistance: Supervision Gait Distance (Feet): 500 Feet Assistive device: None Gait Pattern/deviations: Step-through pattern Gait velocity: slightly decreased   General Gait Details: pt steady overall with hallway ambulation without use of an AD  Stairs Stairs: Yes Stairs assistance: Supervision Stair Management: One rail  Left;Step to pattern;Forwards Number of Stairs: 2    Wheelchair Mobility    Modified Rankin (Stroke Patients Only)       Balance Overall balance assessment: No apparent balance deficits (not formally assessed)                                           Pertinent Vitals/Pain Pain Assessment: Faces Faces Pain Scale: Hurts a little bit Pain Location: neck Pain Descriptors / Indicators: Sore Pain Intervention(s): Monitored during session    Home Living Family/patient expects to be discharged to:: Private residence Living Arrangements: Spouse/significant other Available Help at Discharge: Family;Available 24 hours/day Type of Home: House Home Access: Stairs to enter   [employer] of Steps: 1 Home Layout: One level        Prior Function Level of Independence: Independent         Comments: works     Journalist, newspaper        Extremity/Trunk Assessment   Upper Extremity Assessment Upper Extremity Assessment: Defer to OT evaluation;Overall WFL for tasks assessed    Lower Extremity Assessment Lower Extremity Assessment: Overall WFL for tasks assessed    Cervical / Trunk Assessment Cervical / Trunk Assessment: Other exceptions Cervical / Trunk Exceptions: s/p cervical sx  Communication   Communication: No difficulties  Cognition Arousal/Alertness: Awake/alert Behavior During Therapy: WFL for tasks assessed/performed Overall Cognitive Status: Within Functional Limits for tasks assessed  General Comments      Exercises     Assessment/Plan    PT Assessment Patent does not need any further PT services  PT Problem List         PT Treatment Interventions      PT Goals (Current goals can be found in the Care Plan section)  Acute Rehab PT Goals Patient Stated Goal: decrease pain PT Goal Formulation: All assessment and education complete, DC therapy    Frequency      Barriers to discharge        Co-evaluation               AM-PAC PT "6 Clicks" Mobility  Outcome Measure Help needed turning from your back to your side while in a flat bed without using bedrails?: None Help needed moving from lying on your back to sitting on the side of a flat bed without using bedrails?: None Help needed moving to and from a bed to a chair (including a wheelchair)?: None Help needed standing up from a chair using your arms (e.g., wheelchair or bedside chair)?: None Help needed to walk in hospital room?: None Help needed climbing 3-5 steps with a railing? : None 6 Click Score: 24    End of Session   Activity Tolerance: Patient tolerated treatment well Patient left: in bed;with call bell/phone within reach;with family/visitor present Nurse Communication: Mobility status PT Visit Diagnosis: Other abnormalities of gait and mobility (R26.89)    Time: 5361-4431 PT Time Calculation (min) (ACUTE ONLY): 14 min   Charges:   PT Evaluation $PT Eval Low Complexity: 1 Low          Eduard Clos, PT, DPT  Acute Rehabilitation Services Pager [phone] Office [place] 11/11/2019, 8:47 AM

## 2019-11-11 NOTE — Evaluation (Signed)
Occupational Therapy Evaluation Patient Details Name: [name] MRN: [id number] DOB: 10-12-1945 Today's Date: 11/11/2019    History of Present Illness Pt is a 74 y/o male s/p ACDF C3-C4. PMH including but not limited to pulmonary fibrosis and polymyalgia rheumatica.   Clinical Impression   PTA pt living independently with spouse. At time of eval, pt demonstrates ability to complete BADL/IADL at mod I level. Reviewed cervical precautions via handout with pt. Reviewed: safe positioning for sleeping, dressing, bathing, and various IADL. Pt is able to complete LB dressing with figure 4 position and complete functional mobility without AD or LOB. Discussed pain management with ice as appropriate with pt. No further OT needs identified, OT will sign off.     Follow Up Recommendations  No OT follow up    Equipment Recommendations  None recommended by OT    Recommendations for Other Services       Precautions / Restrictions Precautions Precautions: Cervical Precaution Booklet Issued: Yes (comment) Precaution Comments: reviewed precautions throughout Restrictions Weight Bearing Restrictions: No      Mobility Bed Mobility Overal bed mobility: Modified Independent                Transfers Overall transfer level: Modified independent Equipment used: None                  Balance Overall balance assessment: No apparent balance deficits (not formally assessed)                                         ADL either performed or assessed with clinical judgement   ADL Overall ADL's : Modified independent                                       General ADL Comments: Pt presents with ability to complete BADL at mod I level. Pt completed functional mobility and toilet transfer without AD. He then completed LB dressing with figure 4. Discussed appropriate precautions with pt typical BADL routine     Vision Baseline Vision/History:  Wears glasses Wears Glasses: At all times Patient Visual Report: No change from baseline       Perception     Praxis      Pertinent Vitals/Pain Pain Assessment: Faces Faces Pain Scale: Hurts a little bit Pain Location: neck Pain Descriptors / Indicators: Sore Pain Intervention(s): Monitored during session     Hand Dominance     Extremity/Trunk Assessment Upper Extremity Assessment Upper Extremity Assessment: Overall WFL for tasks assessed (no strength deficits noted)   Lower Extremity Assessment Lower Extremity Assessment: Overall WFL for tasks assessed   Cervical / Trunk Assessment Cervical / Trunk Assessment: Other exceptions Cervical / Trunk Exceptions: s/p cervical sx   Communication Communication Communication: No difficulties   Cognition Arousal/Alertness: Awake/alert Behavior During Therapy: WFL for tasks assessed/performed Overall Cognitive Status: Within Functional Limits for tasks assessed                                     General Comments       Exercises     Shoulder Instructions      Home Living Family/patient expects to be discharged to:: Private residence Living Arrangements: Spouse/significant other Available Help  at Discharge: Family;Available 24 hours/day Type of Home: House Home Access: Stairs to enter [employer] of Steps: 1   Home Layout: One level     Bathroom Shower/Tub: Occupational psychologist: Standard     Home Equipment: Shower seat;Grab bars - tub/shower          Prior Functioning/Environment Level of Independence: Independent        Comments: works        OT Problem List: Decreased knowledge of use of DME or AE;Decreased knowledge of precautions      OT Treatment/Interventions:      OT Goals(Current goals can be found in the care plan section) Acute Rehab OT Goals Patient Stated Goal: return to independence OT Goal Formulation: All assessment and education complete, DC  therapy  OT Frequency:     Barriers to D/C:            Co-evaluation              AM-PAC OT "6 Clicks" Daily Activity     Outcome Measure Help from another person eating meals?: None Help from another person taking care of personal grooming?: None Help from another person toileting, which includes using toliet, bedpan, or urinal?: None Help from another person bathing (including washing, rinsing, drying)?: None Help from another person to put on and taking off regular upper body clothing?: None Help from another person to put on and taking off regular lower body clothing?: None 6 Click Score: 24   End of Session Nurse Communication: Mobility status;Precautions  Activity Tolerance: Patient tolerated treatment well Patient left: Other (comment) (in hallway with PT)  OT Visit Diagnosis: Other abnormalities of gait and mobility (R26.89)                Time: 8182-9937 OT Time Calculation (min): 16 min Charges:  OT General Charges $OT Visit: 1 Visit OT Evaluation $OT Eval Low Complexity: 1 Low  Zenovia Jarred, MSOT, OTR/L Acute Rehabilitation Services [facility] Office Number: [phone] Pager: [phone]  Zenovia Jarred 11/11/2019, 9:17 AM

## 2019-11-24 ENCOUNTER — Ambulatory Visit: Payer: PPO | Admitting: Physician Assistant

## 2019-11-24 ENCOUNTER — Other Ambulatory Visit: Payer: Self-pay

## 2019-11-24 ENCOUNTER — Encounter: Payer: Self-pay | Admitting: Physician Assistant

## 2019-11-24 VITALS — BP 114/68 | HR 86 | Ht 68.0 in | Wt 180.8 lb

## 2019-11-24 DIAGNOSIS — Z8679 Personal history of other diseases of the circulatory system: Secondary | ICD-10-CM

## 2019-11-24 DIAGNOSIS — J849 Interstitial pulmonary disease, unspecified: Secondary | ICD-10-CM

## 2019-11-24 DIAGNOSIS — E782 Mixed hyperlipidemia: Secondary | ICD-10-CM | POA: Diagnosis not present

## 2019-11-24 DIAGNOSIS — I480 Paroxysmal atrial fibrillation: Secondary | ICD-10-CM | POA: Diagnosis not present

## 2019-11-24 DIAGNOSIS — I10 Essential (primary) hypertension: Secondary | ICD-10-CM | POA: Diagnosis not present

## 2019-11-24 NOTE — Progress Notes (Signed)
Cardiology Office Note:    Date:  11/26/2019   ID:  [name], DOB 08/16/1945, MRN [id number]  PCP:  Hall, John Z, MD  [facility] Cardiologist:  Thomas Kelly, MD  [facility] Electrophysiologist:  None   Referring MD: Hall, John Z, MD   Chief Complaint  Patient presents with  . Follow-up    seen for Dr. Kelly    History of Present Illness:    [name] is a 74 y.o. male with a hx of viral myocarditis, HTN, HLD, polymyalgia rheumatica, PAF, ILD and OSA on CPAP. He suffered an episode of viral myocarditis in 1989, his ejection for restriction was 25% at the time time which normalized on subsequent check. He also had a history of mitral valve prolapse. Cardiac catheterization in 2001 showed only mild mid systolic LAD bridging. He has been intolerant of Lipitor and Crestor due to myalgia. In 2017, he developed new atrial fibrillation while vacationing at [place]. He was also placed on Xarelto at the time, this was later switched to Eliquis due to cost. Echocardiogram in November 2017 showed EF 60 to 65%, grade 1 DD, mildly dilated aortic root measuring 38 mm. Myoview showed normal EF without ischemia. He is being followed by pulmonology service for interstitial show lung disease.  He underwent open lung biopsy by Dr. Hendrickson in October 2019 which showed possible early fibrotic process suggestive of UIP.  Biopsy was later evaluated at [facility] by Dr. Sporn.  He felt patient had chronic fibrosing interstitial pneumonia but unclassified pattern.  This was felt to be related to either systemic inflammatory or connective tissue disorder.  He was seen by rheumatologist and started on trial of Ofev.  He underwent exploratory laparotomy and a colostomy due to acute diverticulitis and spontaneous sigmoid colon perforation in February 2020 and underwent colostomy takedown in May 2020.  He later underwent successful reversal of colostomy by Dr. Toth. Lung disease  improved after reinstitution of CellCept.  He was most recently seen by Dr. Kelly in January 2021, he was started on a trial of Nexlizet.  Unfortunately, Nexlizet was not approved by his insurance.  Patient presents today for cardiology follow-up.  He denies any recent palpitation, lower extremity edema, orthopnea or PND.  On physical exam, his heart rate is quite regular and appears to be in sinus rhythm.  He is thinking about coming off of Eliquis so he can take ibuprofen for his joint pain.  I am concerned about the increased risk of stroke with this.  Calculating his overall risk, while on the Eliquis, his overall stroke risk is 1.3%, however off of Eliquis, his annual stroke risk is 3.1%.  And this overall risk is cumulative with age.  He says he is joint is very stiff especially with changes in the weather, I think it would be reasonable for him to take occasional ibuprofen once or twice per week.  This will be a better alternative than for him to come off of Eliquis completely.  We also discussed alternative therapy for statins.  I gave him some information about different medications as alternative to statins, he will have a repeat fasting lipid panel at his PCPs office in the next month, he will forward a copy to us then we can decide which medication need to be started.    Past Medical History:  Diagnosis Date  . Allergy to alpha-gal    2015; has been able to re-introduce red meat for the past 2 1/2   years without reaction (as of 01/21/19)  . Arthritis   . Chest pain    a. 03/2017: echo showing EF of 60-65%, no regional WMA or significant valve abnormalities. b. 03/2016: NST with no evidence of ischemia.   . Complication of anesthesia    "anaphylactic" reaction after given xylocaine for shoulder injection > 10 years ago; reported no known reaction when given marcaine (as of 01/21/19)   . Digestive problems    on Prednisone prn for this issue- diarrhea  . Dyspnea    with activity  .  Dysrhythmia    irregular due to Myocarditis- takes Atenolol, Norvasc  . GERD (gastroesophageal reflux disease)   . H/O viral myocarditis    25 years ago  . Head injury, closed, with concussion    Breif LOC  . Hyperlipidemia   . Mild mitral valve prolapse    per Dr Evette Georges notes  . PAF (paroxysmal atrial fibrillation) ([place])    a. initially occuring in 02/2016. b. recurrent in 07/2016. Placed on Eliquis  . Polymyalgia rheumatica ([place])   . Pre-diabetes   . Pulmonary fibrosis ([place])   . Sleep apnea    mild sleep apnea   no CPAP    Past Surgical History:  Procedure Laterality Date  . ANTERIOR CERVICAL DECOMP/DISCECTOMY FUSION N/A 11/10/2019   Procedure: Cervical Three-Four Anterior cervical decompression/discectomy/fusion;  Surgeon: [name], MD;  Location: [place];  Service: Neurosurgery;  Laterality: N/A;  Cervical Three-Four Anterior cervical decompression/discectomy/fusion  . apendectomy  1965  . APPENDECTOMY    . BACK SURGERY    . bone spur Bilateral 1999   feet  . CARDIAC CATHETERIZATION  2001  . CHOLECYSTECTOMY  2004  . COLON RESECTION N/A 06/15/2018   Procedure: SIGMOID COLON RESECTION;  Surgeon: [name], MD;  Location: [facility];  Service: General;  Laterality: N/A;  . COLONOSCOPY    . COLOSTOMY N/A 06/15/2018   Procedure: COLOSTOMY;  Surgeon: [name], MD;  Location: [facility];  Service: General;  Laterality: N/A;  . COLOSTOMY TAKEDOWN N/A 01/26/2019   Procedure: LAPAROSCOPIC ASSISTED COLOSTOMY TAKEDOWN;  Surgeon: [name], MD;  Location: [place];  Service: General;  Laterality: N/A;  . EYE SURGERY Bilateral    cataract removal  . LAPAROSCOPIC LYSIS OF ADHESIONS N/A 01/26/2019   Procedure: LAPAROSCOPIC LYSIS OF ADHESIONS;  Surgeon: [name], MD;  Location: [place];  Service: General;  Laterality: N/A;  . LAPAROTOMY N/A 06/15/2018   Procedure: EXPLORATORY LAPAROTOMY;  Surgeon: [name], MD;  Location: [facility];  Service: General;  Laterality: N/A;  . LUMBAR  LAMINECTOMY/ DECOMPRESSION WITH MET-RX Right 05/05/2013   Procedure: Right Lumbar three-four Extraforaminal Microdiskectomy with Metrex;  Surgeon: [name], MD;  Location: [facility];  Service: Neurosurgery;  Laterality: Right;  Right Lumbar three-four Extraforaminal Microdiskectomy with Metrex  . LUNG BIOPSY Right 02/19/2018   Procedure: LUNG BIOPSY;  Surgeon: [name], MD;  Location: [place];  Service: Thoracic;  Laterality: Right;  . SHOULDER OPEN ROTATOR CUFF REPAIR Bilateral 2001  . TONSILLECTOMY    . VIDEO ASSISTED THORACOSCOPY Right 02/19/2018   Procedure: VIDEO ASSISTED THORACOSCOPY;  Surgeon: [name], MD;  Location: [facility];  Service: Thoracic;  Laterality: Right;    Current Medications: Current Meds  Medication Sig  . acetaminophen (TYLENOL) 650 MG CR tablet Take 1,300 mg by mouth every 8 (eight) hours as needed for pain.  . Ascorbic Acid (VITAMIN C) 1000 MG tablet Take 1,000 mg  by mouth daily.  . CARTIA XT 120 MG 24 hr capsule TAKE ONE CAPSULE BY MOUTH DAILY (Patient taking differently: Take 120 mg by mouth daily. )  . cetirizine (ZYRTEC) 10 MG tablet Take 10 mg by mouth daily as needed for allergies.   . Cholecalciferol (DIALYVITE VITAMIN D 5000) 125 MCG (5000 UT) capsule Take 5,000 Units by mouth daily.  . cholecalciferol (VITAMIN D3) 25 MCG (1000 UNIT) tablet Take 1,000 Units by mouth every evening.  . diclofenac Sodium (VOLTAREN) 1 % GEL Apply 1 application topically 4 (four) times daily as needed (pain).  . diphenoxylate-atropine (LOMOTIL) 2.5-0.025 MG tablet Take 1 tablet by mouth 4 (four) times daily as needed for diarrhea or loose stools.  . ELIQUIS 5 MG TABS tablet TAKE ONE TABLET (5MG TOTAL) BY MOUTH TWODAILY (Patient taking differently: Take 5 mg by mouth 2 (two) times daily. )  . ezetimibe (ZETIA) 10 MG tablet Take 10 mg by mouth daily.  . fluticasone (FLONASE) 50 MCG/ACT nasal spray Place 2 sprays into both nostrils daily as needed for  allergies or rhinitis.  . gabapentin (NEURONTIN) 300 MG capsule Take 300 mg by mouth 3 (three) times daily.   . metoprolol tartrate (LOPRESSOR) 25 MG tablet TAKE ONE TABLET BY MOUTH TWICE A DAY (Patient taking differently: Take 25 mg by mouth 2 (two) times daily. )  . Nintedanib (OFEV) 150 MG CAPS Take 1 capsule (150 mg total) by mouth daily. (Patient taking differently: Take 100 mg by mouth daily. )  . nystatin (MYCOSTATIN) 100000 UNIT/ML suspension Take 5 mLs (500,000 Units total) by mouth every other day. (Patient taking differently: Take 5 mLs by mouth daily as needed (thrush). )  . Omega-3 Fatty Acids (FISH OIL) 500 MG CAPS Take 500 mg by mouth daily.   . ondansetron (ZOFRAN) 4 MG tablet Take 4 mg by mouth every 8 (eight) hours as needed for nausea or vomiting.   . predniSONE (DELTASONE) 5 MG tablet Take 5 mg by mouth daily.   . tamsulosin (FLOMAX) 0.4 MG CAPS capsule Take 0.4 mg by mouth 2 (two) times daily.   . umeclidinium-vilanterol (ANORO ELLIPTA) 62.5-25 MCG/INH AEPB Inhale 1 puff into the lungs daily.  . [DISCONTINUED] budesonide-formoterol (SYMBICORT) 80-4.5 MCG/ACT inhaler Inhale 2 puffs into the lungs 2 (two) times daily.  . [DISCONTINUED] diazepam (VALIUM) 5 MG tablet Take 1 tablet (5 mg total) by mouth every 6 (six) hours as needed for muscle spasms.  . [DISCONTINUED] mycophenolate (CELLCEPT) 500 MG tablet Take 2 tablets (1,000 mg total) by mouth 2 (two) times daily.  . [DISCONTINUED] sulfamethoxazole-trimethoprim (BACTRIM DS) 800-160 MG tablet 1 Monday Wednesday Friday     Allergies:   Xylocaine [lidocaine], Codeine, Statins, and Percocet [oxycodone-acetaminophen]   Social History   Socioeconomic History  . Marital status: Married    Spouse name: Not on file  . Number of children: Not on file  . Years of education: Not on file  . Highest education level: Not on file  Occupational History  . Not on file  Tobacco Use  . Smoking status: Never Smoker  . Smokeless tobacco:  Never Used  Vaping Use  . Vaping Use: Never used  Substance and Sexual Activity  . Alcohol use: Yes    Alcohol/week: 21.0 standard drinks    Types: 21 Glasses of wine per week    Comment: 2/3 glasses wine in evening  . Drug use: No  . Sexual activity: Yes    Birth control/protection: Other-see comments      Comment: old age  Other Topics Concern  . Not on file  Social History Narrative  . Not on file   Social Determinants of Health   Financial Resource Strain:   . Difficulty of Paying Living Expenses:   Food Insecurity:   . Worried About Running Out of Food in the Last Year:   . Ran Out of Food in the Last Year:   Transportation Needs:   . Lack of Transportation (Medical):   . Lack of Transportation (Non-Medical):   Physical Activity:   . Days of Exercise per Week:   . Minutes of Exercise per Session:   Stress:   . Feeling of Stress :   Social Connections:   . Frequency of Communication with Friends and Family:   . Frequency of Social Gatherings with Friends and Family:   . Attends Religious Services:   . Active Member of Clubs or Organizations:   . Attends Club or Organization Meetings:   . Marital Status:      Family History: The patient's family history includes Asthma in his father; Heart disease in his father; Melanoma in his brother; Rheum arthritis in his mother; Thyroid cancer in his mother and sister.  ROS:   Please see the history of present illness.     All other systems reviewed and are negative.  EKGs/Labs/Other Studies Reviewed:    The following studies were reviewed today:  Echo 09/14/2019 IMPRESSIONS    1. Left ventricular ejection fraction, by estimation, is 60 to 65%. The  left ventricle has normal function. The left ventricle has no regional  wall motion abnormalities. Left ventricular diastolic parameters are  indeterminate.  2. Right ventricular systolic function is normal. The right ventricular  size is normal. There is normal pulmonary  artery systolic pressure. The  estimated right ventricular systolic pressure is 28.2 mmHg.  3. The mitral valve is grossly normal. Trivial mitral valve  regurgitation.  4. The aortic valve is tricuspid. Aortic valve regurgitation is not  visualized.  5. The inferior vena cava is normal in size with greater than 50%  respiratory variability, suggesting right atrial pressure of 3 mmHg.   EKG:  EKG is not ordered today.   Recent Labs: 08/27/2019: Pro B Natriuretic peptide (BNP) 62.0 10/22/2019: ALT 12 11/06/2019: BUN 9; Creatinine, Ser 0.83; Hemoglobin 16.9; Platelets 210; Potassium 4.0; Sodium 138  Recent Lipid Panel    Component Value Date/Time   CHOL 222 (H) 07/16/2016 0902   TRIG 359 (H) 07/16/2016 0902   HDL 67 07/16/2016 0902   CHOLHDL 3.3 07/16/2016 0902   VLDL 72 (H) 07/16/2016 0902   LDLCALC 83 07/16/2016 0902    Physical Exam:    VS:  BP 114/68   Pulse 86   Ht 5' 8" (1.727 m)   Wt 180 lb 12.8 oz (82 kg)   SpO2 96%   BMI 27.49 kg/m     Wt Readings from Last 3 Encounters:  11/24/19 180 lb 12.8 oz (82 kg)  11/10/19 185 lb (83.9 kg)  11/06/19 184 lb 8.4 oz (83.7 kg)     GEN:  Well nourished, well developed in no acute distress HEENT: Normal NECK: No JVD; No carotid bruits LYMPHATICS: No lymphadenopathy CARDIAC: RRR, no murmurs, rubs, gallops RESPIRATORY:  Clear to auscultation without rales, wheezing or rhonchi  ABDOMEN: Soft, non-tender, non-distended MUSCULOSKELETAL:  No edema; No deformity  SKIN: Warm and dry NEUROLOGIC:  Alert and oriented x 3 PSYCHIATRIC:  Normal affect   ASSESSMENT:      1. PAF (paroxysmal atrial fibrillation) ([place])   2. Essential hypertension   3. Mixed hyperlipidemia   4. History of viral myocarditis: 1989   5. ILD (interstitial lung disease) ([place])    PLAN:    In order of problems listed above:  1. PAF: Well controlled on diltiazem and Eliquis.  Patient wished to come off of Eliquis so he can take ibuprofen for joint pain.   If he come off of Eliquis, his yearly stroke risk is more than 3%, and this is cumulative every year.  I did not advise him to come off of Eliquis, compared to the stroke risk, I think it is reasonable if he has to take ibuprofen once or twice a week to help with the knee pain.  2. Hypertension: Blood pressure stable  3. Hyperlipidemia: On Zetia and fish oil.  We discussed possibility of PCSK9 inhibitor or Bempedoic acid.  4. History of viral myocarditis: Ejection fraction has normalized since 1989  5. Interstitial lung disease: on Ofev   Medication Adjustments/Labs and Tests Ordered: Current medicines are reviewed at length with the patient today.  Concerns regarding medicines are outlined above.  No orders of the defined types were placed in this encounter.  No orders of the defined types were placed in this encounter.   Patient Instructions  Medication Instructions:  Your physician recommends that you continue on your current medications as directed. Please refer to the Current Medication list given to you today.  *If you need a refill on your cardiac medications before your next appointment, please call your pharmacy*  Lab Work:  Please have your Primary Care Physician fax a copy of your Cholesterol blood work. Fax # [phone] Attn: [name], PA-C If you have labs (blood work) drawn today and your tests are completely normal, you will receive your results only by: Marland Kitchen MyChart Message (if you have MyChart) OR . A paper copy in the mail If you have any lab test that is abnormal or we need to change your treatment, we will call you to review the results.  Testing/Procedures: NONE ordered at this time of appointment   Follow-Up: At [facility], you and your health needs are our priority.  As part of our continuing mission to provide you with exceptional heart care, we have created designated Provider Care Teams.  These Care Teams include your primary Cardiologist (physician)  and Advanced Practice Providers (APPs -  Physician Assistants and Nurse Practitioners) who all work together to provide you with the care you need, when you need it.  Your next appointment:   6 month(s)  The format for your next appointment:   In Person  Provider:   Shelva Majestic, MD  Other Instructions  Alternative to Statins  PCSK 9 inhibitors (once every 2 weeks injection; 2 main drugs under this class; Repatha vs Praluent) Nexletol (Bempedoic acid) vs Nexlizet (combination pill contain Bempedoic acid + Zetia)      Hilbert Corrigan, PA  11/26/2019 11:41 PM    [place]

## 2019-11-24 NOTE — Patient Instructions (Addendum)
Medication Instructions:  Your physician recommends that you continue on your current medications as directed. Please refer to the Current Medication list given to you today.  *If you need a refill on your cardiac medications before your next appointment, please call your pharmacy*  Lab Work:  Please have your Primary Care Physician fax a copy of your Cholesterol blood work. Fax # [phone] Attn: [name], PA-C If you have labs (blood work) drawn today and your tests are completely normal, you will receive your results only by: Marland Kitchen MyChart Message (if you have MyChart) OR . A paper copy in the mail If you have any lab test that is abnormal or we need to change your treatment, we will call you to review the results.  Testing/Procedures: NONE ordered at this time of appointment   Follow-Up: At [facility], you and your health needs are our priority.  As part of our continuing mission to provide you with exceptional heart care, we have created designated Provider Care Teams.  These Care Teams include your primary Cardiologist (physician) and Advanced Practice Providers (APPs -  Physician Assistants and Nurse Practitioners) who all work together to provide you with the care you need, when you need it.  Your next appointment:   6 month(s)  The format for your next appointment:   In Person  Provider:   Shelva Majestic, MD  Other Instructions  Alternative to Statins  PCSK 9 inhibitors (once every 2 weeks injection; 2 main drugs under this class; Repatha vs Praluent) Nexletol (Bempedoic acid) vs Nexlizet (combination pill contain Bempedoic acid + Zetia)

## 2019-11-26 ENCOUNTER — Encounter: Payer: Self-pay | Admitting: Physician Assistant

## 2019-11-30 DIAGNOSIS — E1169 Type 2 diabetes mellitus with other specified complication: Secondary | ICD-10-CM | POA: Diagnosis not present

## 2019-11-30 DIAGNOSIS — J8410 Pulmonary fibrosis, unspecified: Secondary | ICD-10-CM | POA: Diagnosis not present

## 2019-11-30 DIAGNOSIS — G729 Myopathy, unspecified: Secondary | ICD-10-CM | POA: Diagnosis not present

## 2019-11-30 DIAGNOSIS — I4891 Unspecified atrial fibrillation: Secondary | ICD-10-CM | POA: Diagnosis not present

## 2019-12-02 DIAGNOSIS — S32010G Wedge compression fracture of first lumbar vertebra, subsequent encounter for fracture with delayed healing: Secondary | ICD-10-CM | POA: Diagnosis not present

## 2019-12-02 DIAGNOSIS — M4722 Other spondylosis with radiculopathy, cervical region: Secondary | ICD-10-CM | POA: Diagnosis not present

## 2019-12-11 IMAGING — DX DG CHEST 1V SAME DAY
1 series · 1 of 1 positions shown · non-contrast
Comparison: 02/20/2018

CLINICAL DATA: Interstitial lung disease, chest tube

EXAM:
CHEST - 1 VIEW SAME DAY

[chest]
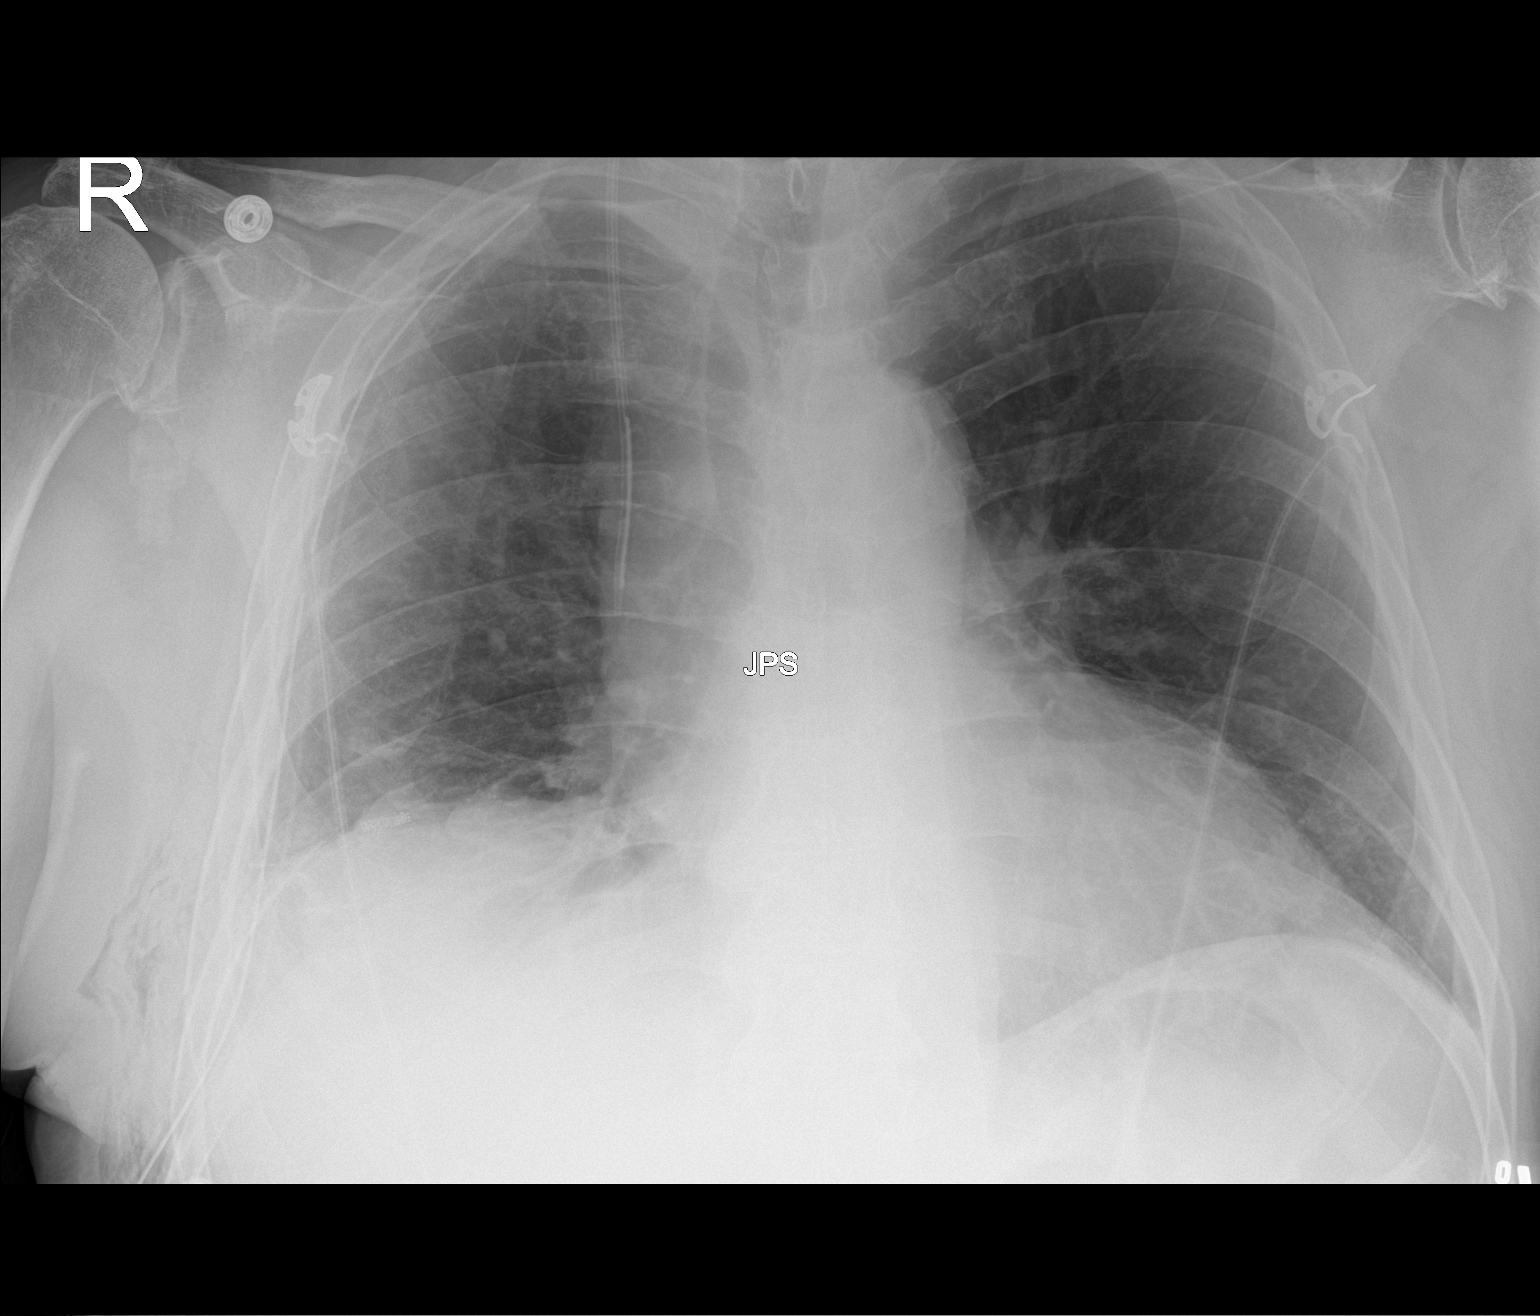

[1 of 1 positions shown; findings below may reference images not displayed]

FINDINGS: Right central line and right chest tube remain in place, unchanged.
No visible pneumothorax. Postoperative changes at the right lung
base. Subcutaneous emphysema in the right chest wall is stable.
Right base atelectasis. Mild cardiomegaly. No focal opacity or
effusion on the left.
IMPRESSION: Right chest tube remains in place with postoperative changes on the
right. No visible pneumothorax. Right base atelectasis.

Mild cardiomegaly.

## 2019-12-24 DIAGNOSIS — I4891 Unspecified atrial fibrillation: Secondary | ICD-10-CM | POA: Diagnosis not present

## 2019-12-24 DIAGNOSIS — E1169 Type 2 diabetes mellitus with other specified complication: Secondary | ICD-10-CM | POA: Diagnosis not present

## 2019-12-24 DIAGNOSIS — G729 Myopathy, unspecified: Secondary | ICD-10-CM | POA: Diagnosis not present

## 2019-12-24 DIAGNOSIS — J8410 Pulmonary fibrosis, unspecified: Secondary | ICD-10-CM | POA: Diagnosis not present

## 2019-12-30 ENCOUNTER — Encounter: Payer: Self-pay | Admitting: Internal Medicine

## 2019-12-30 ENCOUNTER — Ambulatory Visit: Payer: PPO | Admitting: Internal Medicine

## 2019-12-30 ENCOUNTER — Other Ambulatory Visit: Payer: Self-pay

## 2019-12-30 ENCOUNTER — Ambulatory Visit (INDEPENDENT_AMBULATORY_CARE_PROVIDER_SITE_OTHER): Payer: PPO | Admitting: Internal Medicine

## 2019-12-30 ENCOUNTER — Other Ambulatory Visit: Payer: Self-pay | Admitting: Cardiovascular Disease

## 2019-12-30 VITALS — BP 126/78 | HR 80 | Temp 98.1°F | Ht 68.0 in | Wt 183.0 lb

## 2019-12-30 DIAGNOSIS — K521 Toxic gastroenteritis and colitis: Secondary | ICD-10-CM

## 2019-12-30 DIAGNOSIS — J849 Interstitial pulmonary disease, unspecified: Secondary | ICD-10-CM | POA: Diagnosis not present

## 2019-12-30 LAB — PULMONARY FUNCTION TEST
DL/VA % pred: 92 %
DL/VA: 3.74 ml/min/mmHg/L
DLCO cor % pred: 85 %
DLCO cor: 20.55 ml/min/mmHg
DLCO unc % pred: 86 %
DLCO unc: 20.72 ml/min/mmHg
FEF 25-75 Pre: 2.96 L/sec
FEF2575-%Pred-Pre: 134 %
FEV1-%Pred-Pre: 99 %
FEV1-Pre: 2.93 L
FEV1FVC-%Pred-Pre: 112 %
FEV6-%Pred-Pre: 94 %
FEV6-Pre: 3.56 L
FEV6FVC-%Pred-Pre: 106 %
FVC-%Pred-Pre: 88 %
FVC-Pre: 3.56 L
Pre FEV1/FVC ratio: 82 %
Pre FEV6/FVC Ratio: 100 %

## 2019-12-30 NOTE — Progress Notes (Signed)
Synopsis: Referred in November 2017 for evaluation of shortness of breath.  Found to have diffuse parenchymal lung disease of undetermined cause. An open lung biopsy performed on 02/19/2018 showed an early fibrotic process suggestive of UIP.  He only smoked cigarettes for about 3 to 6 months when he was a teenager.  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    OCt 2019   [name] returns to clinic today to discuss the results from his recent open lung biopsy which showed early fibrotic changes suggestive of UIP as well as emphysema.  He tells me today that he is slowly feeling better but he does still have some fatigue and shortness of breath.  He has not been exercising much lately.  He tried to go fishing yesterday and he says that any walking was quite difficult for him.  He says that he is not coughing too much right now.  He is taking prednisone still as directed by her clinic earlier this month and he is wondering what to do with that.  He is still taking a bronchodilator.  He tells me today that he only smokes cigarettes for about 3 to 6 months when he was a teenager.  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   OV March 2020 [name] has returned to clinic today to see me.  He recently was hospitalized for diverticulitis, perforated, requiring emergent surgery. He says that his wound is slowly healing. He resumed CellCept and prednisone per my recommendation. His wife says that his wound is healing fairly well.  His shortness of breath is at baseline.  No recent pneumonia or bronchitis.  He is taking his nintedanib.   [name] GENDER: [name] DOB: 19-May-1945, MRN: [id number]  Chief Complaint  Patient presents with  . Follow-up    Patient has quit taking the OFEV and has felt a lot  better since then. Patient was taking Stiolto but got thrush and took some Nystatin and it resolved it. Patient needs back surgery in about 3-4 months.      OV 04/16/2019: [name] is a 74 year old gentleman who presents for follow-up of NSIP.  He has had worse symptoms since interrupting his CellCept and nintedanib for his colectomy reversal.  He had bleeding when he initially restarted these medicines a few weeks postop, which required stopping them again.  He restarted his nintedanib last week and CellCept with Bactrim about 2 weeks prior.  He has not had any additional bleeding.  No abdominal pain.  He feels better than he did before surgery.  He still has mild dyspnea, not worse than his last visit.  He notices it most when he is leaning over.  No other complaints.  His PCP did his 49-monthlab work earlier this week, and he will call the office to request they send uKorearesults.   OV 03/19/2019: [name] a 74year old gentleman with history of ILD to NSIP and polymyalgia rheumatica on chronic immunosuppression who presents for follow-up.  He underwent colostomy reversal on 01/26/2019, for which his CellCept plus Bactrim and nintedanib were held due to concern for potential surgical complications.  Several weeks following surgery his medications were restarted, and within a day or 2 he developed rectal bleeding.  This continued for several days until he  notified us, and we instructed him to stop.  Within the next day or 2 the bleeding has stopped.  He notices that his breathing has started to get a little bit worse over the last few weeks, and he would like to restart his medications.  He feels like he cannot exert himself due to his mild dyspnea.  He denies abdominal pain, fevers, nausea, vomiting, diarrhea, constipation, or periodic rectal bleeding.  He is following up with Dr. Marlou Starks surgery tomorrow.  He had had a colonoscopy just prior to surgery, but is unsure if he had hemorrhoids.  He  follows with [name] in rheumatology.  His prednisone has been tapered down to 7 mg, which will be his dose for now.  There has been mention of possibly starting Plaquenil in the future for his polymyalgia rheumatica.  OV 01/21/19: [name] is a 75 year old gentleman with a history of fibrosing NSIP diagnosed on open lung biopsy in 2019 who presents for routine follow-up and preoperative evaluation before colostomy reversal next week.  He has a history of polymyalgia rheumatica for which she has been on chronic steroids.  He is followed by [name] from rheumatology.  Currently he is being titrated off his chronic prednisone, currently on 8 mg daily.  For his NSIP he is maintained on CellCept, nintedanib, and Bactrim 3 days a week.  He is doing well, better than last year from breathlessness standpoint.  He went hiking with his wife a few weeks ago, which he reports he would have been unable to do a year ago.  He has nausea and diarrhea associated with nintedanib, but he is able to tolerate these with the use of Zofran and Imodium.  No jaundice, itching, right upper quadrant pain.  In February 2020 he underwent emergency partial colectomy with diverting colostomy for perforated diverticulitis.  He is planning to undergo colostomy reversal next week.  In July he fell, causing an L1 compression fracture.  [name] is planning to do a bone mineral density study, and currently he is not on calcium and vitamin D supplements.  In a few months he is planning on having a small outpatient back surgery to address a bone spur that is causing radicular pain in his left leg.  Today he wants to discuss plans for his pulmonary meds perioperatively.  He has previously had symptomatic renal insufficiency when he tried to come off prednisone on his own too quickly.  During his VATS and colectomy he received 10 mg of dexamethasone in the OR, and he received low-dose Solu-Medrol postoperatively from his  colectomy.     OV 07/21/19: [name] is a 74 year old gentleman with a history of ILD- UIP vs NSIP on chronic Ofev, mycophenolate who presents for follow-up.  He required disruption of these medications for several months in late 2020 his colostomy reversal.  When the meds were restarted postoperatively he had persistent bleeding, leading to prolonged time off medications and restarting them sequentially.  He feels that his breathing has gotten worse over time since then, but is unsure if that was all due to being off the meds or if it is continued to decline since then.  His breathing is usually worse during warmer weather.  He has noticed that his activities are more limited than they used to be.  He recently had an outpatient back surgery where he had to have bone fragments removed that were pinching and nerves.  He was developing left lower extremity weakness preoperatively.  Due  to ongoing nerve impingement that is presumed to be due to inflammation, he is currently on a prednisone taper.  Currently on 20 mg daily and will decrease to 15 mg daily tomorrow.  He is on chronic prednisone for polymyalgia rheumatica at 7 mg daily.  He has been on Ofev 100 mg twice daily.  He was initially started on 150 mg twice daily, but required decrease in his dose due to nausea, vomiting, diarrhea.  Over time the symptoms have improved.  He restarted his meds postoperatively in the fall 2020 he had symptoms short-term, but noticed improvement over time.  When he had GI symptoms at the higher dose, he had some improvement with Zofran and Phenergan.  He is currently not having blood in his stools or other issues related to surgery.    He and his wife are planning a cross-country trip with their RV to visit children and grandchildren later this spring where they will be gone for 2 months.  Reviewed rheumatology, Dr. Melissa Noon clinic note 06/15/2019.  Considering starting Plaquenil to reduce dose of prednisone.   OV  4?!5/21  [name] is a 74 year old gentleman with a history of IPF who presents for follow-up.  He is accompanied by his wife today.  He recently had a severe shortness of breath episode that was precipitated by being around a significant amount of sawdust.  He did well with Stiolto, but developed thrush, which has been an issue for him with inhalers in the past.  He is improved with nystatin.  He stopped his Ofev due to severe intractable nausea and vomiting.  He now feels much better and is back to normal.  His shortness of breath is persistent-he has trouble walking around the house.  He has continued on his Bactrim and CellCept plus prednisone for polymyalgia rheumatica.  He wants to know if he can stop his Eliquis to be able to take an NSAID for help with his joint pain.  He has new leg edema bilaterally, but no edema elsewhere.  No chest pain.       OV 10/22/2019  Subjective:  [name], [name] , DOB: 09-22-1945 , age 56 y.o. , MRN: [id number] , ADDRESS: [place] [address]   PCP [name], MD Rheum - Dr Jannette Fogo 0Dr McQuaid -> Dr Ernest Mallick -> Dr Chase Caller  10/22/2019 -   Chief Complaint  Patient presents with  . Follow-up    Pt being referred to Dr. Chase Caller by Dr. Carlis Abbott for ILD.  Pt states he has been doing good since last visit and states that he feels SOB is stable. Denies any complaints of cough or chest discomfort.  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  - in 2019 : considered progressive pre-bx (on PFT/CT per notes)  - in 2019 - clinical CTD ruled out by Dr. Esmeralda Arthur record review_  -High-resolution CT March 2021: Indeterminate for UIP without change since 2017  RX  -  Mid-endJan 2020 -s tarted cellcept/bactrim + ofev (course complicated early February 2020 by diverticulitis and intermittent side effects with stop start of the nintedanib)  Pathological tissue mild emphysema present on surgical lung biopsy October 2019 [not evident on pulmonary function testing]  - Alpha 1 - MM:  Oct 2019  -On inhaler therapy  -No obstruction seen on PFT or emphysema on CT   History of perforated sigmoid diverticulitis early February 2021 few weeks after starting nintedanib/CellCept  -Status post reversal of colostomy in September 2020   HPI [name] 74 y.o. -presents to the ILD center with the above history.  Is a transfer of care to the ILD center.  Recently his case was discussed in June 2021 with interstitial lung disease multidisciplinary conference.  His pathology though showed fibroblastic foci did have then reformed branching fibrosis.  There was ossification.  There is no honeycombing.  There was heterogeneity and fibroblastic foci suggestive of UIP.  Overall it was felt that he had unclassifiable ILD.  The radiologist felt his CT scan had not progressed between 2018 and 2021.  His pulmonary function test were variable.  Therefore it was recommended that he be seen at the ILD center.  His ILD diagnosis as above.  He tells me though that when he started the nintedanib he started feeling better.  Although at the same time or before that he was also committed to inhaler therapy based on pathological emphysema on his lung biopsy from 2019.  This is also helped.  Therefore he is a concomitant diagnosis COPD/emphysema.  He is currently here to discuss his future care options.  He tells me  that he has continued CellCept and Bactrim for his ILD since early 2020 except for interruptions due to surgery.  He has been off nintedanib but recently has rechallenged himself at 150 mg once daily for the last 1 month and he is tolerating it fine.  His rheumatologist is working down on his prednisone to 5 mg/day [this is for polymyalgia rheumatica].  He tells me he has upcoming spinal surgery to the C-spine and later to the L-spine.  He wants clearance for this.  Noted he is on anticoagulation with Eliquis and has had bleeding episode.  [place] Integrated Comprehensive ILD Questionnaire  Symptoms:    Overall he is reporting progressive dyspnea although the conference it was felt radiology did not change over time.  Is been going on insidious onset for the last 3 years.  Dyspnea present on exertion relieved by rest.    SYMPTOM SCALE - ILD 10/22/2019   O2 use ra  Shortness of Breath 0 -> 5 scale with 5 being worst (score 6 If unable to do)  At rest 1  Simple tasks - showers, clothes change, eating, shaving 3  Household (dishes, doing bed, laundry) 3  Shopping 3  Walking level at own pace 4  Walking up Stairs 5  Total (30-36) Dyspnea Score 19  How bad is your cough? 0  How bad is your fatigue yes  How bad is nausea yes  How bad is vomiting?  n  How bad is diarrhea? no  How bad is anxiety? x  How bad is depression x        Past Medical History : As above   ROS: Positive for fatigue and diffuse arthralgia.  No dysphagia.  No dry eyes.  No Raynaud's no weight loss.  No vomiting.  No rash   FAMILY HISTORY of LUNG DISEASE:  -Father had  asthma.  No COPD no sarcoid no cystic fibrosis no hypersensitive pneumonitis no autoimmune disease.  EXPOSURE HISTORY: Briefly smoked cigarettes for 3 years.  Otherwise never smoked  Cigarettes.  He did smoke marijuana very little in 1972 and never use cocaine.  No intravenous drug use     HOME and HOBBY DETAILS [place].  Age of the home is 56 years.  He has lived in the home for 30 years.  No dampness no mildew no mold no humidifier use no CPAP use no nebulizer use.  No steam iron use.  No Jacuzzi use.  There is a misting Fountain in the house because his wife likes it.  But there is no mold or mildew.  No pet gerbils.  No feather pillows.  No mold in the [facility] duct.  No music habits no guarding habits.  No flood of water damage.  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  In the past he is done woodwork, Brewing technologist work, Biomedical engineer.  He has been exposed to asbestos woodwork and furniture work.  During furniture work he got exposed to meth saline chloride.  He also is a [place] (27 items): CellCept, prednisone and nintedanib       Simple office walk 185 feet x  3 laps goal with forehead probe 10/22/2019   O2 used ra  Number laps completed 3  Comments about pace avg  Resting Pulse Ox/HR 98% and 90/min  Final Pulse Ox/HR 96% and 107/min  Desaturated </= 88% no  Desaturated <= 3% points no  Got Tachycardic >/= 90/min yes  Symptoms at end of test Mild dyspnea  Miscellaneous comments x       Results for ZAKARIYA, KNICKERBOCKER "SI" (MRN [id number]) as of 10/22/2019 16:07  Ref. Range 05/03/2016 16:34 10/26/2016 09:28 01/10/2018 11:01 07/15/2019 08:59  FVC-Pre Latest Units: L 3.55 4.21 3.52 3.58  FVC-%Pred-Pre Latest Units: % 80 96 82 88   Results for Whitfill, Randall SILER "SI" (MRN [id number]) as of 10/22/2019 16:07  Ref. Range 05/03/2016 16:34 10/26/2016 09:28 01/10/2018 11:01 07/15/2019 08:59  TLC Latest Units: L 5.85 6.79 7.25 5.86  TLC % pred Latest Units: % 84 98 106 88   Results for Mckeel, Jed SILER "SI" (MRN [id number]) as of 10/22/2019 16:07  Ref. Range 05/03/2016 16:34 10/26/2016 09:28 01/10/2018 11:01  07/15/2019 08:59  DLCO unc Latest Units: ml/min/mmHg 23.92 26.71 21.15 19.51  DLCO unc % pred Latest Units: % 75 84 68 80      ROS - per HPI  Results for KACI, FREEL SILER "SI" (MRN [id number]) as of 10/22/2019 16:07  Ref. Range 05/29/2016 10:05 10/10/2016 13:54 01/14/2018 09:50 01/14/2018 09:50 03/12/2018 11:24 6/10  Aspergillus flavus Unknown REPORT       Aspergillus Fumigatus Latest Ref Range: NEGATIVE     NEGATIVE    A fumigatus #1 Unknown REPORT       Aspergillus [place] Unknown REPORT       [place] Serum Latest Ref Range: NEGATIVE  REPORT   NEGATIVE    Apullulans Unknown REPORT       A-1 Antitrypsin, Ser Latest Ref Range: 83 - 199 mg/dL     125   Anti Nuclear Antibody (ANA) Latest Ref Range: NEGATIVE  NEG   NEGATIVE  CENTROMERE AB SCREEN Latest Ref Range: <1.0 NEG AI <1.0 NEG  <7.6 NEG     Cyclic Citrullin Peptide Ab Latest Units: UNITS <16   <16  < 16  RA Latex Turbid. Latest Ref Range: <14 IU/mL 24 (H)   38 (H)  32  B burgdorferi Ab IgG+IgM Latest Ref Range: <0.90 Index  <0.90      SSA (Ro) (ENA) Antibody, IgG Latest Ref Range: <1.0 NEG AI <1.0 NEG   <1.0 NEG    SSB (La) (ENA) Antibody, IgG Latest Ref Range: <1.0 NEG AI <1.0 NEG   <1.0 NEG    Scleroderma (Scl-70) (ENA) Antibody, IgG Latest Ref Range: <1.0 NEG AI <1.0 NEG  <1.0 NEG <1.0 NEG      IMPRESSION: COMPARISON:  CT chest, 12/06/2017, 06/13/2016, 04/02/2016  FINDINGS: Cardiovascular: Aortic atherosclerosis. Normal heart size. No pericardial effusion.  Mediastinum/Nodes: No enlarged mediastinal, hilar, or axillary lymph nodes. Thyroid gland, trachea, and esophagus demonstrate no significant findings.  Lungs/Pleura: Wedge resections of the right lung. Redemonstrated mild pulmonary fibrosis with a slight apical to basal gradient featuring tubular bronchiectasis in the lower lobes, peripheral irregular interstitial pulmonary opacity, and no evidence of bronchiolectasis or honeycombing. Fibrotic findings are  not significantly changed compared to prior examinations. No significant air trapping on expiratory phase examination. No pleural effusion or pneumothorax.  1. No significant change in mild pulmonary fibrosis with a slight apical to basal gradient featuring tubular bronchiectasis in the lower lobes, peripheral irregular interstitial pulmonary opacity, and no evidence of bronchiolectasis or honeycombing. No significant air trapping on expiratory phase examination. Findings remain consistent with "indeterminate for UIP" pattern fibrosis by ATS pulmonary fibrosis criteria. Findings are indeterminate for UIP per consensus guidelines: Diagnosis of Idiopathic Pulmonary Fibrosis: An Official ATS/ERS/JRS/ALAT Clinical Practice Guideline. [place], Iss 5, [phone], Jan 12 2017.  2.  Interval wedge biopsies of the right lung.  3.  Aortic Atherosclerosis (ICD10-I70.0).   Electronically Signed   By: Eddie Candle M.D.   On: 07/30/2019 16:33    OV 12/30/2019  Subjective:  [name], [name] , DOB: [birth year] , age 74 y.o. , MRN: [id number] , ADDRESS: [place] [address]   12/30/2019 -   Chief Complaint  Patient presents with  . Follow-up    reports feeling "about the same" since last visit. Last albuterol use was this past weekend   Follow-up unclassifiable interstitial lung disease with trace positive rheumatoid factor HPI [name] 74 y.o. -presents for follow-up. Since his last visit we recheck his autoimmune panel. His rheumatoid factor is only trace positive. He had pulmonary function test today and is documented below the stable. His spring 2021 CT chest is stable. Overall stability for few years. He again tells me that he is much better than he was 18 months ago in terms of shortness of breath. Symptom scores document that. At this point in time he is off CellCept. He thinks his improvement is related to  starting nintedanib. His wife is here with him and I am meeting her for the first time. She did acknowledge that this potential that he is feeling better because back then when he had the PVCs of ILD diagnosed he was not in a good frame of mind. Because he was dealing with back and neck issues. This is because his pre and post nintedanib PFT shows stability. Since his last visit he is off CellCept. He has had a  C-spine surgery. His back surgery has been postponed. However he is dealing with significant diarrhea. The diarrhea is despite stopping CellCept. This is because of nintedanib. He currently has dropped his nintedanib to 100 mg once daily but despite that is still having diarrhea. He does take some coffee in the morning with sugar but despite that has diarrhea. He prefers that he stop the nintedanib but in the past he acknowledges that it helped him.     SYMPTOM SCALE - ILD 10/22/2019  12/30/2019   O2 use ra ra  Shortness of Breath 0 -> 5 scale with 5 being worst (score 6 If unable to do)   At rest 1 1  Simple tasks - showers, clothes change, eating, shaving 3 1  Household (dishes, doing bed, laundry) 3 2  Shopping 3 1  Walking level at own pace 4 2  Walking up Stairs 5 4  Total (30-36) Dyspnea Score 19 11  How bad is your cough? 0 0  How bad is your fatigue yes 3  How bad is nausea yes 3 ofev  How bad is vomiting?  n 0  How bad is diarrhea? no 3 ofev  How bad is anxiety? x 1  How bad is depression x 1     Simple office walk 185 feet x  3 laps goal with forehead probe 10/22/2019   O2 used ra  Number laps completed 3  Comments about pace avg  Resting Pulse Ox/HR 98% and 90/min  Final Pulse Ox/HR 96% and 107/min  Desaturated </= 88% no  Desaturated <= 3% points no  Got Tachycardic >/= 90/min yes  Symptoms at end of test Mild dyspnea  Miscellaneous comments x    PFT Results Latest Ref Rng & Units 12/30/2019 07/15/2019 01/10/2018 10/26/2016 05/03/2016  FVC-Pre L 3.56 3.58 3.52  4.21 3.55  FVC-Predicted Pre % 88 88 82 96 80  FVC-Post L - 3.60 3.53 4.08 3.65  FVC-Predicted Post % - 88 83 93 83  Pre FEV1/FVC % % 82 76 81 78 81  Post FEV1/FCV % % - 79 82 81 83  FEV1-Pre L 2.93 2.72 2.85 3.30 2.88  FEV1-Predicted Pre % 99 91 91 102 88  FEV1-Post L - 2.85 2.88 3.30 3.02  DLCO uncorrected ml/min/mmHg 20.72 19.51 21.15 26.71 23.92  DLCO UNC% % 86 80 68 84 75  DLCO corrected ml/min/mmHg 20.55 19.51 20.44 25.62 23.99  DLCO COR %Predicted % 85 80 65 80 75  DLVA Predicted % 92 90 81 87 91  TLC L - 5.86 7.25 6.79 5.85  TLC % Predicted % - 88 106 98 84  RV % Predicted % - 94 155 102 94     ROS - per HPI     has a past medical history of Allergy to alpha-gal, Arthritis, Chest pain, Complication of anesthesia, Digestive problems, Dyspnea, Dysrhythmia, GERD (gastroesophageal reflux disease), H/O viral myocarditis, Head injury, closed, with concussion, Hyperlipidemia, Mild mitral valve prolapse, PAF (paroxysmal atrial fibrillation) (HCC), Polymyalgia rheumatica ([place]), Pre-diabetes, Pulmonary fibrosis ([place]), and Sleep apnea.   reports that he has never smoked. He has never used smokeless tobacco.  Past Surgical History:  Procedure Laterality Date  . ANTERIOR CERVICAL DECOMP/DISCECTOMY FUSION N/A 11/10/2019   Procedure: Cervical Three-Four Anterior cervical decompression/discectomy/fusion;  Surgeon: [name], MD;  Location: [facility];  Service: Neurosurgery;  Laterality: N/A;  Cervical Three-Four Anterior cervical decompression/discectomy/fusion  . apendectomy  1965  . APPENDECTOMY    . BACK SURGERY    .  bone spur Bilateral 1999   feet  . CARDIAC CATHETERIZATION  2001  . CHOLECYSTECTOMY  2004  . COLON RESECTION N/A 06/15/2018   Procedure: SIGMOID COLON RESECTION;  Surgeon: [name], MD;  Location: [facility];  Service: General;  Laterality: N/A;  . COLONOSCOPY    . COLOSTOMY N/A 06/15/2018   Procedure: COLOSTOMY;  Surgeon: [name], MD;  Location: [facility];   Service: General;  Laterality: N/A;  . COLOSTOMY TAKEDOWN N/A 01/26/2019   Procedure: LAPAROSCOPIC ASSISTED COLOSTOMY TAKEDOWN;  Surgeon: [name], MD;  Location: [facility];  Service: General;  Laterality: N/A;  . EYE SURGERY Bilateral    cataract removal  . LAPAROSCOPIC LYSIS OF ADHESIONS N/A 01/26/2019   Procedure: LAPAROSCOPIC LYSIS OF ADHESIONS;  Surgeon: [name], MD;  Location: [facility];  Service: General;  Laterality: N/A;  . LAPAROTOMY N/A 06/15/2018   Procedure: EXPLORATORY LAPAROTOMY;  Surgeon: [name], MD;  Location: [facility];  Service: General;  Laterality: N/A;  . LUMBAR LAMINECTOMY/ DECOMPRESSION WITH MET-RX Right 05/05/2013   Procedure: Right Lumbar three-four Extraforaminal Microdiskectomy with Metrex;  Surgeon: [name], MD;  Location: [facility];  Service: Neurosurgery;  Laterality: Right;  Right Lumbar three-four Extraforaminal Microdiskectomy with Metrex  . LUNG BIOPSY Right 02/19/2018   Procedure: LUNG BIOPSY;  Surgeon: [name], MD;  Location: [facility];  Service: Thoracic;  Laterality: Right;  . SHOULDER OPEN ROTATOR CUFF REPAIR Bilateral 2001  . TONSILLECTOMY    . VIDEO ASSISTED THORACOSCOPY Right 02/19/2018   Procedure: VIDEO ASSISTED THORACOSCOPY;  Surgeon: [name], MD;  Location: [facility];  Service: Thoracic;  Laterality: Right;    Allergies  Allergen Reactions  . Xylocaine [Lidocaine] Anaphylaxis  . Codeine Nausea And Vomiting  . Statins Other (See Comments)    Muscle soreness and an aching feeling all over Myalgias  . Percocet [Oxycodone-Acetaminophen] Itching    Immunization History  Administered Date(s) Administered  . Influenza Split 03/20/2016  . Influenza, High Dose Seasonal PF 01/29/2018, 02/25/2019, 03/20/2019  . Influenza,inj,Quad PF,6+ Mos 03/12/2017  . Moderna SARS-COVID-2 Vaccination 06/04/2019, 07/10/2019  . Pneumococcal Conjugate-13 01/10/2018    Family History  Problem Relation Age of Onset  . Rheum  arthritis Mother   . Thyroid cancer Mother   . Heart disease Father   . Asthma Father   . Thyroid cancer Sister   . Melanoma Brother      Current Outpatient Medications:  .  acetaminophen (TYLENOL) 650 MG CR tablet, Take 1,300 mg by mouth every 8 (eight) hours as needed for pain., Disp: , Rfl:  .  Ascorbic Acid (VITAMIN C) 1000 MG tablet, Take 1,000 mg by mouth daily., Disp: , Rfl:  .  CARTIA XT 120 MG 24 hr capsule, TAKE ONE CAPSULE BY MOUTH DAILY (Patient taking differently: Take 120 mg by mouth daily. ), Disp: 30 capsule, Rfl: 6 .  cetirizine (ZYRTEC) 10 MG tablet, Take 10 mg by mouth daily as needed for allergies. , Disp: , Rfl:  .  Cholecalciferol (DIALYVITE VITAMIN D 5000) 125 MCG (5000 UT) capsule, Take 5,000 Units by mouth daily., Disp: , Rfl:  .  cholecalciferol (VITAMIN D3) 25 MCG (1000 UNIT) tablet, Take 1,000 Units by mouth every evening., Disp: , Rfl:  .  diclofenac Sodium (VOLTAREN) 1 % GEL, Apply 1 application topically 4 (four) times daily as needed (pain)., Disp: , Rfl:  .  diphenoxylate-atropine (LOMOTIL) 2.5-0.025 MG tablet, Take 1 tablet by mouth 4 (four) times  daily as needed for diarrhea or loose stools., Disp: , Rfl:  .  ELIQUIS 5 MG TABS tablet, TAKE ONE TABLET (5MG TOTAL) BY MOUTH TWODAILY (Patient taking differently: Take 5 mg by mouth 2 (two) times daily. ), Disp: 180 tablet, Rfl: 1 .  ezetimibe (ZETIA) 10 MG tablet, Take 10 mg by mouth daily., Disp: , Rfl:  .  fluticasone (FLONASE) 50 MCG/ACT nasal spray, Place 2 sprays into both nostrils daily as needed for allergies or rhinitis., Disp: , Rfl:  .  gabapentin (NEURONTIN) 300 MG capsule, Take 300 mg by mouth 3 (three) times daily. , Disp: , Rfl:  .  metoprolol tartrate (LOPRESSOR) 25 MG tablet, TAKE ONE TABLET BY MOUTH TWICE A DAY (Patient taking differently: Take 25 mg by mouth 2 (two) times daily. ), Disp: 180 tablet, Rfl: 3 .  Nintedanib (OFEV) 150 MG CAPS, Take 1 capsule (150 mg total) by mouth daily. (Patient  taking differently: Take 100 mg by mouth daily. ), Disp: 60 capsule, Rfl: 2 .  nystatin (MYCOSTATIN) 100000 UNIT/ML suspension, Take 5 mLs (500,000 Units total) by mouth every other day. (Patient taking differently: Take 5 mLs by mouth daily as needed (thrush). ), Disp: 437 mL, Rfl: 11 .  Omega-3 Fatty Acids (FISH OIL) 500 MG CAPS, Take 500 mg by mouth daily. , Disp: , Rfl:  .  ondansetron (ZOFRAN) 4 MG tablet, Take 4 mg by mouth every 8 (eight) hours as needed for nausea or vomiting. , Disp: , Rfl:  .  predniSONE (DELTASONE) 5 MG tablet, Take 5 mg by mouth daily. , Disp: , Rfl:  .  tamsulosin (FLOMAX) 0.4 MG CAPS capsule, Take 0.4 mg by mouth 2 (two) times daily. , Disp: , Rfl:  .  umeclidinium-vilanterol (ANORO ELLIPTA) 62.5-25 MCG/INH AEPB, Inhale 1 puff into the lungs daily., Disp: 60 each, Rfl: 3      Objective:   Vitals:   12/30/19 1047  BP: 126/78  Pulse: 80  Temp: 98.1 F (36.7 C)  SpO2: 97%  Weight: 183 lb (83 kg)  Height: _0  (1.727 m)    Estimated body mass index is 27.83 kg/m as calculated from the following:   Height as of this encounter: _1  (1.727 m).   Weight as of this encounter: 183 lb (83 kg).  _2 @  Filed Weights   12/30/19 1047  Weight: 183 lb (83 kg)     Physical Exam Pleasant [name] wearing a mask alert and oriented x3. Wife is here with him. Barely can hear any crackles. No stigmata of connective tissue disease. He has a scar in his neck from spine surgery. Otherwise nonfocal exam.       Assessment:       ICD-10-CM   1. Diarrhea due to drug  K52.1   2. ILD (interstitial lung disease) (HCC)  J84.9 Pulmonary function test  3. Interstitial pulmonary disease (HCC)  J84.9 CT Chest High Resolution   I think and balance has been stable. 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 The literature on this is to treat when there is evidence of progression. Currently because of side effects of nintedanib he has to come off nintedanib anyways. We  discussed about using a wait and watch approach versus starting pirfenidone [Lancet paper showed benefit of pirfenidone and progressive unclassifiable ILD]. Because of the overlap of GI side effects and he has got some baseline nausea he wants to hold off on pirfenidone unless he has progression. I support this approach.    Plan:  Patient Instructions     ICD-10-CM   1. Diarrhea due to drug  K52.1   2. 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 I do not think the slightly positive trace rheumatoid factor is clinically significant at this stage  Nintedanib is causing diarrhea  Current thinking is to treat unclassifiable interstitial lung disease with antifibrotic if there is evidence of progression   Plan -Stop nintedanib  -We took a shared decision making based on current evidence that we would hold off on starting the alternative antifibrotic call pirfenidone based on current knowledge base about this type of pulmonary fibrosis  -Repeat spirometry and DLCO in 6 months  -Do high-resolution CT chest in 6 months supine and prone  -Take all precautions to avoid respiratory viral infections  -You can consider joining pulmonary rehabilitation to improve your symptoms and also join a support group by e-mailing [email or web address]. Also visit website [email or web address]  Follow-up -Return in 6 months but after spirometry and high-resolution CT chest to see me for follow-up in a 30-minute slot  -Any concerns come sooner  ( Level 05 visit: Estb 40-54 min   in  visit type: on-site physical face to visit  in total care time and counseling or/and coordination of care by this undersigned MD - Dr Brand Males. This includes one or more of the following on this same day 12/30/2019: pre-charting, chart review, note writing, documentation discussion of test results, diagnostic or treatment  recommendations, prognosis, risks and benefits of management options, instructions, education, compliance or risk-factor reduction. It excludes time spent by the [facility] or office staff in the care of the patient. Actual time 58 min)    SIGNATURE    Dr. Brand Males, M.D., F.C.C.P,  Pulmonary and Critical Care Medicine Staff Physician, [facility] Director - Interstitial Lung Disease  Program  Pulmonary [facility] at [place], [place], [address]  Pager: [phone], If no answer or between  15:00h - 7:00h: call [phone] Telephone: [phone]  2:02 PM 12/30/2019

## 2019-12-30 NOTE — Patient Instructions (Addendum)
ICD-10-CM   1. ILD (interstitial lung disease) (HCC)  J84.9 Pulmonary function test  2. Diarrhea due to drug  K52.1   3. 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 I do not think the slightly positive trace rheumatoid factor is clinically significant at this stage  Nintedanib is causing diarrhea  Current thinking is to treat unclassifiable interstitial lung disease with antifibrotic if there is evidence of progression   Plan -Stop nintedanib  -We took a shared decision making based on current evidence that we would hold off on starting the alternative antifibrotic call pirfenidone based on current knowledge base about this type of pulmonary fibrosis  -Repeat spirometry and DLCO in 6 months  -Do high-resolution CT chest in 6 months supine and prone  -Take all precautions to avoid respiratory viral infections  -You can consider joining pulmonary rehabilitation to improve your symptoms and also join a support group by e-mailing [email or web address]. Also visit website [email or web address]  Follow-up -Return in 6 months but after spirometry and high-resolution CT chest to see me for follow-up in a 30-minute slot  -Any concerns come sooner

## 2019-12-30 NOTE — Progress Notes (Signed)
Spirometry and DLCO performed today. °

## 2019-12-30 NOTE — Progress Notes (Signed)
Spirometry/DLCO performed today. 

## 2019-12-31 ENCOUNTER — Telehealth: Payer: Self-pay | Admitting: Cardiovascular Disease

## 2019-12-31 MED ORDER — DILTIAZEM HCL ER COATED BEADS 120 MG PO CP24: 120.0000 mg | ORAL_CAPSULE | Freq: Every day | ORAL | 3 refills | Status: DC

## 2019-12-31 NOTE — Telephone Encounter (Signed)
Rx updated.

## 2019-12-31 NOTE — Telephone Encounter (Signed)
    Pt c/o medication issue:  1. Name of Medication: diltiazem (CARDIZEM CD) 120 MG 24 hr capsule  2. How are you currently taking this medication (dosage and times per day)?   3. Are you having a reaction (difficulty breathing--STAT)?   4. What is your medication issue? Jonni Sanger from [facility] calling, due to [employer] this medication need refill for 90 days.

## 2020-01-04 DIAGNOSIS — M25519 Pain in unspecified shoulder: Secondary | ICD-10-CM | POA: Diagnosis not present

## 2020-01-04 DIAGNOSIS — I2510 Atherosclerotic heart disease of native coronary artery without angina pectoris: Secondary | ICD-10-CM | POA: Diagnosis not present

## 2020-01-04 DIAGNOSIS — Z48815 Encounter for surgical aftercare following surgery on the digestive system: Secondary | ICD-10-CM | POA: Diagnosis not present

## 2020-01-04 DIAGNOSIS — Z6826 Body mass index (BMI) 26.0-26.9, adult: Secondary | ICD-10-CM | POA: Diagnosis not present

## 2020-01-04 DIAGNOSIS — Z433 Encounter for attention to colostomy: Secondary | ICD-10-CM | POA: Diagnosis not present

## 2020-01-04 DIAGNOSIS — R945 Abnormal results of liver function studies: Secondary | ICD-10-CM | POA: Diagnosis not present

## 2020-01-04 DIAGNOSIS — B370 Candidal stomatitis: Secondary | ICD-10-CM | POA: Diagnosis not present

## 2020-01-04 DIAGNOSIS — K5780 Diverticulitis of intestine, part unspecified, with perforation and abscess without bleeding: Secondary | ICD-10-CM | POA: Diagnosis not present

## 2020-01-04 DIAGNOSIS — Z7689 Persons encountering health services in other specified circumstances: Secondary | ICD-10-CM | POA: Diagnosis not present

## 2020-01-04 DIAGNOSIS — F5101 Primary insomnia: Secondary | ICD-10-CM | POA: Diagnosis not present

## 2020-01-04 DIAGNOSIS — R0602 Shortness of breath: Secondary | ICD-10-CM | POA: Diagnosis not present

## 2020-01-04 DIAGNOSIS — E0965 Drug or chemical induced diabetes mellitus with hyperglycemia: Secondary | ICD-10-CM | POA: Diagnosis not present

## 2020-01-04 DIAGNOSIS — I4581 Long QT syndrome: Secondary | ICD-10-CM | POA: Diagnosis not present

## 2020-01-07 ENCOUNTER — Telehealth: Payer: Self-pay | Admitting: Internal Medicine

## 2020-01-07 DIAGNOSIS — R0602 Shortness of breath: Secondary | ICD-10-CM | POA: Diagnosis not present

## 2020-01-07 DIAGNOSIS — E0965 Drug or chemical induced diabetes mellitus with hyperglycemia: Secondary | ICD-10-CM | POA: Diagnosis not present

## 2020-01-07 DIAGNOSIS — M353 Polymyalgia rheumatica: Secondary | ICD-10-CM | POA: Diagnosis not present

## 2020-01-07 DIAGNOSIS — I482 Chronic atrial fibrillation, unspecified: Secondary | ICD-10-CM | POA: Diagnosis not present

## 2020-01-07 DIAGNOSIS — F5101 Primary insomnia: Secondary | ICD-10-CM | POA: Diagnosis not present

## 2020-01-07 DIAGNOSIS — I2510 Atherosclerotic heart disease of native coronary artery without angina pectoris: Secondary | ICD-10-CM | POA: Diagnosis not present

## 2020-01-07 DIAGNOSIS — M545 Low back pain: Secondary | ICD-10-CM | POA: Diagnosis not present

## 2020-01-07 DIAGNOSIS — I10 Essential (primary) hypertension: Secondary | ICD-10-CM | POA: Diagnosis not present

## 2020-01-07 DIAGNOSIS — G4730 Sleep apnea, unspecified: Secondary | ICD-10-CM | POA: Diagnosis not present

## 2020-01-07 DIAGNOSIS — J849 Interstitial pulmonary disease, unspecified: Secondary | ICD-10-CM | POA: Diagnosis not present

## 2020-01-07 DIAGNOSIS — E782 Mixed hyperlipidemia: Secondary | ICD-10-CM | POA: Diagnosis not present

## 2020-01-07 DIAGNOSIS — M25519 Pain in unspecified shoulder: Secondary | ICD-10-CM | POA: Diagnosis not present

## 2020-01-07 NOTE — Telephone Encounter (Signed)
Is a problem with delta virus and his travel . But then by October is possible delta is under control.. Before I answre more questions - can he tell us if he was on cellcept at time of his covid vaccine in Jan/feb 2021. My records indicate that he came off it before. True?  Is possible that he takes a booster on basis of being immune suppressed  But still masking, avoiding clusters in 11 days before travel and his family in [place] doing same is critical and doing some tests during his travel on all of them is important   Immunization History  Administered Date(s) Administered  . Influenza Split 03/20/2016  . Influenza, High Dose Seasonal PF 01/29/2018, 02/25/2019, 03/20/2019  . Influenza,inj,Quad PF,6+ Mos 03/12/2017  . Moderna SARS-COVID-2 Vaccination 06/04/2019, 07/10/2019  . Pneumococcal Conjugate-13 01/10/2018    Current Outpatient Medications:  .  acetaminophen (TYLENOL) 650 MG CR tablet, Take 1,300 mg by mouth every 8 (eight) hours as needed for pain., Disp: , Rfl:  .  Ascorbic Acid (VITAMIN C) 1000 MG tablet, Take 1,000 mg by mouth daily., Disp: , Rfl:  .  cetirizine (ZYRTEC) 10 MG tablet, Take 10 mg by mouth daily as needed for allergies. , Disp: , Rfl:  .  Cholecalciferol (DIALYVITE VITAMIN D 5000) 125 MCG (5000 UT) capsule, Take 5,000 Units by mouth daily., Disp: , Rfl:  .  cholecalciferol (VITAMIN D3) 25 MCG (1000 UNIT) tablet, Take 1,000 Units by mouth every evening., Disp: , Rfl:  .  diclofenac Sodium (VOLTAREN) 1 % GEL, Apply 1 application topically 4 (four) times daily as needed (pain)., Disp: , Rfl:  .  diltiazem (CARDIZEM CD) 120 MG 24 hr capsule, Take 1 capsule (120 mg total) by mouth daily., Disp: 90 capsule, Rfl: 3 .  diphenoxylate-atropine (LOMOTIL) 2.5-0.025 MG tablet, Take 1 tablet by mouth 4 (four) times daily as needed for diarrhea or loose stools., Disp: , Rfl:  .  ELIQUIS 5 MG TABS tablet, TAKE ONE TABLET (5MG  TOTAL) BY MOUTH TWODAILY (Patient taking differently:  Take 5 mg by mouth 2 (two) times daily. ), Disp: 180 tablet, Rfl: 1 .  ezetimibe (ZETIA) 10 MG tablet, Take 10 mg by mouth daily., Disp: , Rfl:  .  fluticasone (FLONASE) 50 MCG/ACT nasal spray, Place 2 sprays into both nostrils daily as needed for allergies or rhinitis., Disp: , Rfl:  .  gabapentin (NEURONTIN) 300 MG capsule, Take 300 mg by mouth 3 (three) times daily. , Disp: , Rfl:  .  metoprolol tartrate (LOPRESSOR) 25 MG tablet, TAKE ONE TABLET BY MOUTH TWICE A DAY (Patient taking differently: Take 25 mg by mouth 2 (two) times daily. ), Disp: 180 tablet, Rfl: 3 .  Nintedanib (OFEV) 150 MG CAPS, Take 1 capsule (150 mg total) by mouth daily. (Patient taking differently: Take 100 mg by mouth daily. ), Disp: 60 capsule, Rfl: 2 .  nystatin (MYCOSTATIN) 100000 UNIT/ML suspension, Take 5 mLs (500,000 Units total) by mouth every other day. (Patient taking differently: Take 5 mLs by mouth daily as needed (thrush). ), Disp: 437 mL, Rfl: 11 .  Omega-3 Fatty Acids (FISH OIL) 500 MG CAPS, Take 500 mg by mouth daily. , Disp: , Rfl:  .  ondansetron (ZOFRAN) 4 MG tablet, Take 4 mg by mouth every 8 (eight) hours as needed for nausea or vomiting. , Disp: , Rfl:  .  predniSONE (DELTASONE) 5 MG tablet, Take 5 mg by mouth daily. , Disp: , Rfl:  .  tamsulosin (FLOMAX)  0.4 MG CAPS capsule, Take 0.4 mg by mouth 2 (two) times daily. , Disp: , Rfl:  .  umeclidinium-vilanterol (ANORO ELLIPTA) 62.5-25 MCG/INH AEPB, Inhale 1 puff into the lungs daily., Disp: 60 each, Rfl: 3

## 2020-01-07 NOTE — Telephone Encounter (Signed)
Called and spoke with patient he states they are flying to [place] at the end of October. Patient wanting to know what MR thinks about that, being that Covid has started again. He is going to see 5 grandchildren under the age of 44.  Please advise

## 2020-01-07 NOTE — Telephone Encounter (Signed)
He should just get the booster vaccine somtime next few days to few weeks. Can also help with this travel safety

## 2020-01-07 NOTE — Telephone Encounter (Signed)
Called and spoke with patient he states that he believes he was still on Cellcept when he got the vaccine. He is wondering if it was good or bad that he was on it or possibly off it? Would like to know what that has to do with his condition. PCP is going to check antibodies and he is considering getting booster vaccine

## 2020-01-08 NOTE — Telephone Encounter (Signed)
Left message for patient to call back  

## 2020-01-11 NOTE — Telephone Encounter (Signed)
Called and spoke with pt letting him know the info stated by MR and he verbalized understanding.  Pt stated that he won't be 8 months out from his last covid vaccine until the end of October 2021 and wants to know if it would be okay for him to go ahead and get the booster vaccine or if he needs to wait.  Pt received his covid vaccines 06/04/19 and 07/10/19. MR, please advise.

## 2020-01-11 NOTE — Telephone Encounter (Signed)
Pt returning a phone call. Pt can be reached at [phone].

## 2020-01-12 NOTE — Telephone Encounter (Signed)
Called and spoke with pt letting him know the info provided by MR about when he should get the booster vaccine and he verbalized understanding.  While speaking with pt, pt said that his PCP ordered test for covid antibodies which they are faxing to our office for MR to review.  Pt said that the antibody test came back positive showing that it was 82.4u/ml.  Pt wants to know more about what this means. MR, please advise.

## 2020-01-12 NOTE — Telephone Encounter (Signed)
There was an email from the CDC just yesterday that the CDC is working on formal recommendation of a booster for all - in cming months. Based on this, what I suggest Randall Limbach Gadberry do is to get a booster a few weeks before his trip     SIGNATURE    Dr. Brand Males, M.D., F.C.C.P,  Pulmonary and Critical Care Medicine Staff Physician, [facility] Director - Interstitial Lung Disease  Program  Pulmonary [facility] at [place], [place], [address]  Pager: [phone], If no answer  OR between  19:00-7:00h: page [phone] Telephone (clinical office): [phone] Telephone (research): [phone]  8:20 AM 01/12/2020  [email or web address]    Immunization History  Administered Date(s) Administered  . Influenza Split 03/20/2016  . Influenza, High Dose Seasonal PF 01/29/2018, 02/25/2019, 03/20/2019  . Influenza,inj,Quad PF,6+ Mos 03/12/2017  . Moderna SARS-COVID-2 Vaccination 06/04/2019, 07/10/2019  . Pneumococcal Conjugate-13 01/10/2018

## 2020-01-13 NOTE — Telephone Encounter (Signed)
Called patient myself  4:19 PM 01/13/2020  - got vaccine he believes when he was on cellcept  -plans to go to [place] end of October - current antibody is positive - current literature is there is protective - currently booster is recommended for everyone who is immunocompromised but currently not immunocompromised  And has antibody  Plan  - hold off on getting booster now - so around 02/23/2020 (mid October)  - if CDC recommending 3rd shot everyone then he should get vaccine . If not, we can check antibody levels and decide. Regardless, if getting vaccine should be done by mid-oct so there is protection for his end of oct trip   Immunization History  Administered Date(s) Administered  . Influenza Split 03/20/2016  . Influenza, High Dose Seasonal PF 01/29/2018, 02/25/2019, 03/20/2019  . Influenza,inj,Quad PF,6+ Mos 03/12/2017  . Moderna SARS-COVID-2 Vaccination 06/04/2019, 07/10/2019  . Pneumococcal Conjugate-13 01/10/2018   Allergies  Allergen Reactions  . Xylocaine [Lidocaine] Anaphylaxis  . Codeine Nausea And Vomiting  . Statins Other (See Comments)    Muscle soreness and an aching feeling all over Myalgias  . Percocet [Oxycodone-Acetaminophen] Itching     Current Outpatient Medications:  .  acetaminophen (TYLENOL) 650 MG CR tablet, Take 1,300 mg by mouth every 8 (eight) hours as needed for pain., Disp: , Rfl:  .  Ascorbic Acid (VITAMIN C) 1000 MG tablet, Take 1,000 mg by mouth daily., Disp: , Rfl:  .  cetirizine (ZYRTEC) 10 MG tablet, Take 10 mg by mouth daily as needed for allergies. , Disp: , Rfl:  .  Cholecalciferol (DIALYVITE VITAMIN D 5000) 125 MCG (5000 UT) capsule, Take 5,000 Units by mouth daily., Disp: , Rfl:  .  cholecalciferol (VITAMIN D3) 25 MCG (1000 UNIT) tablet, Take 1,000 Units by mouth every evening., Disp: , Rfl:  .  diclofenac Sodium (VOLTAREN) 1 % GEL, Apply 1 application topically 4 (four) times daily as needed (pain)., Disp: , Rfl:  .  diltiazem (CARDIZEM  CD) 120 MG 24 hr capsule, Take 1 capsule (120 mg total) by mouth daily., Disp: 90 capsule, Rfl: 3 .  diphenoxylate-atropine (LOMOTIL) 2.5-0.025 MG tablet, Take 1 tablet by mouth 4 (four) times daily as needed for diarrhea or loose stools., Disp: , Rfl:  .  ELIQUIS 5 MG TABS tablet, TAKE ONE TABLET (5MG  TOTAL) BY MOUTH TWODAILY (Patient taking differently: Take 5 mg by mouth 2 (two) times daily. ), Disp: 180 tablet, Rfl: 1 .  ezetimibe (ZETIA) 10 MG tablet, Take 10 mg by mouth daily., Disp: , Rfl:  .  fluticasone (FLONASE) 50 MCG/ACT nasal spray, Place 2 sprays into both nostrils daily as needed for allergies or rhinitis., Disp: , Rfl:  .  gabapentin (NEURONTIN) 300 MG capsule, Take 300 mg by mouth 3 (three) times daily. , Disp: , Rfl:  .  metoprolol tartrate (LOPRESSOR) 25 MG tablet, TAKE ONE TABLET BY MOUTH TWICE A DAY (Patient taking differently: Take 25 mg by mouth 2 (two) times daily. ), Disp: 180 tablet, Rfl: 3 .  Nintedanib (OFEV) 150 MG CAPS, Take 1 capsule (150 mg total) by mouth daily. (Patient taking differently: Take 100 mg by mouth daily. ), Disp: 60 capsule, Rfl: 2 .  nystatin (MYCOSTATIN) 100000 UNIT/ML suspension, Take 5 mLs (500,000 Units total) by mouth every other day. (Patient taking differently: Take 5 mLs by mouth daily as needed (thrush). ), Disp: 437 mL, Rfl: 11 .  Omega-3 Fatty Acids (FISH OIL) 500 MG CAPS, Take 500 mg by mouth  daily. , Disp: , Rfl:  .  ondansetron (ZOFRAN) 4 MG tablet, Take 4 mg by mouth every 8 (eight) hours as needed for nausea or vomiting. , Disp: , Rfl:  .  predniSONE (DELTASONE) 5 MG tablet, Take 5 mg by mouth daily. , Disp: , Rfl:  .  tamsulosin (FLOMAX) 0.4 MG CAPS capsule, Take 0.4 mg by mouth 2 (two) times daily. , Disp: , Rfl:  .  umeclidinium-vilanterol (ANORO ELLIPTA) 62.5-25 MCG/INH AEPB, Inhale 1 puff into the lungs daily., Disp: 60 each, Rfl: 3

## 2020-02-02 DIAGNOSIS — C44319 Basal cell carcinoma of skin of other parts of face: Secondary | ICD-10-CM | POA: Diagnosis not present

## 2020-02-02 DIAGNOSIS — L570 Actinic keratosis: Secondary | ICD-10-CM | POA: Diagnosis not present

## 2020-02-02 DIAGNOSIS — X32XXXD Exposure to sunlight, subsequent encounter: Secondary | ICD-10-CM | POA: Diagnosis not present

## 2020-02-23 DIAGNOSIS — Z23 Encounter for immunization: Secondary | ICD-10-CM | POA: Diagnosis not present

## 2020-03-03 DIAGNOSIS — M5115 Intervertebral disc disorders with radiculopathy, thoracolumbar region: Secondary | ICD-10-CM | POA: Diagnosis not present

## 2020-03-03 DIAGNOSIS — M5415 Radiculopathy, thoracolumbar region: Secondary | ICD-10-CM | POA: Diagnosis not present

## 2020-03-22 DIAGNOSIS — L570 Actinic keratosis: Secondary | ICD-10-CM | POA: Diagnosis not present

## 2020-03-22 DIAGNOSIS — Z85828 Personal history of other malignant neoplasm of skin: Secondary | ICD-10-CM | POA: Diagnosis not present

## 2020-03-22 DIAGNOSIS — X32XXXD Exposure to sunlight, subsequent encounter: Secondary | ICD-10-CM | POA: Diagnosis not present

## 2020-03-22 DIAGNOSIS — L820 Inflamed seborrheic keratosis: Secondary | ICD-10-CM | POA: Diagnosis not present

## 2020-03-22 DIAGNOSIS — Z08 Encounter for follow-up examination after completed treatment for malignant neoplasm: Secondary | ICD-10-CM | POA: Diagnosis not present

## 2020-04-10 IMAGING — DX DG ABD PORTABLE 2V
2 series · 2 of 2 positions shown · non-contrast
Comparison: CT abdomen and pelvis 06/15/2018

CLINICAL DATA: Lower abdominal pain and cramping today, had sigmoid
colon resection on 06/15/2018

EXAM:
PORTABLE ABDOMEN - 2 VIEW

[abdomen kub]
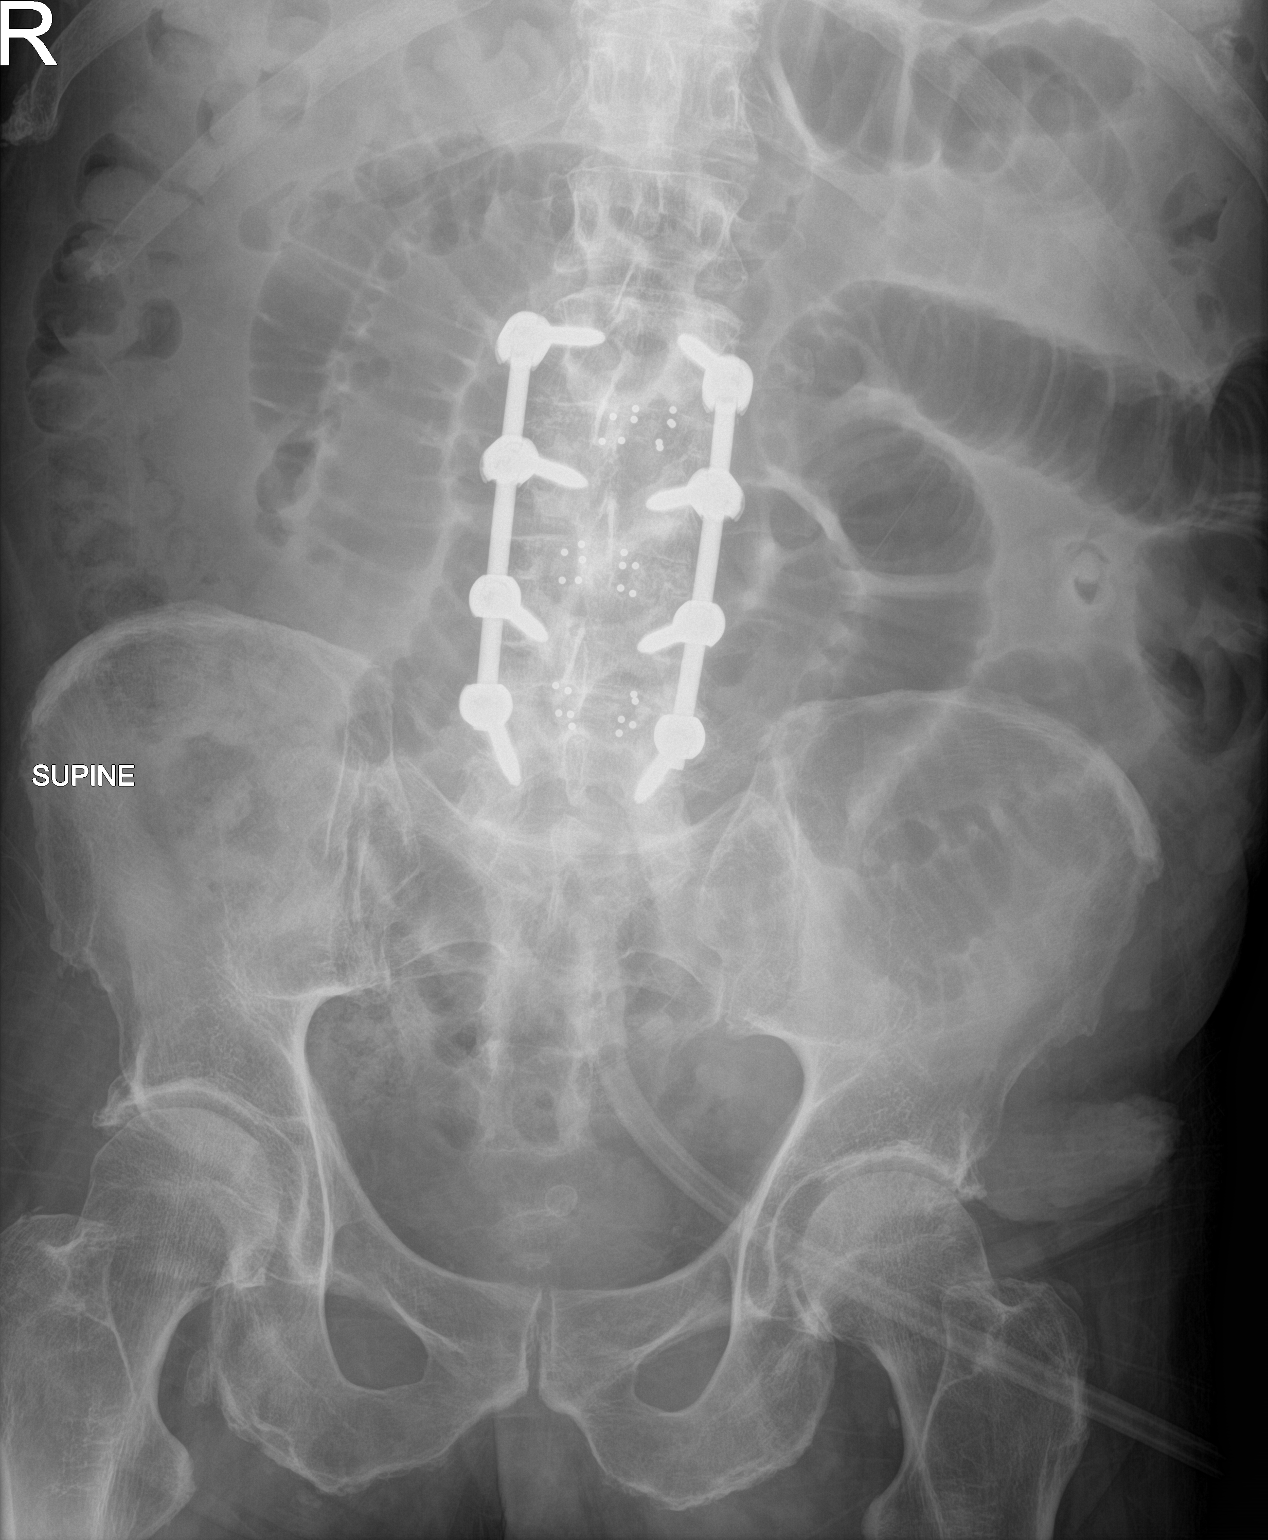

[abdomen decu]
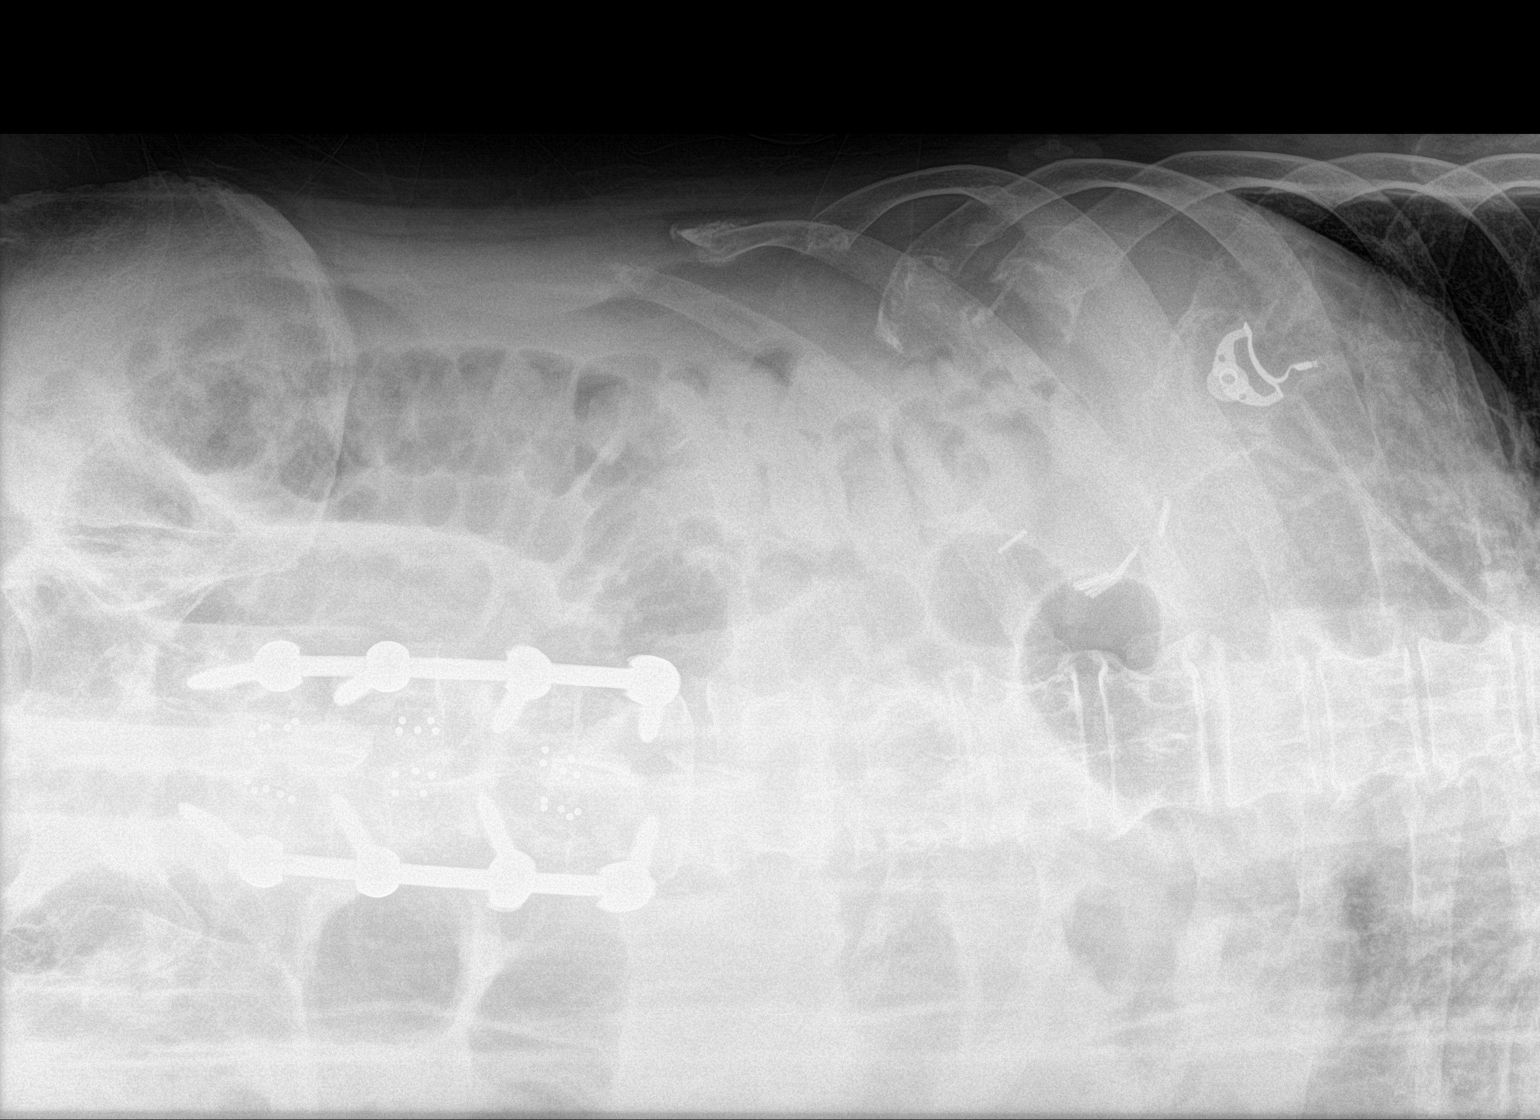

[2 of 2 positions shown; findings below may reference images not displayed]

FINDINGS: Air-filled dilated loops of small bowel throughout the mid abdomen
likely postoperative ileus.

Scattered gas and stool in colon.

No definite bowel wall thickening or free air.

Prior lumbar fusion L2-L5.

Bones demineralized.

No urine tract calcification.
IMPRESSION: Diffuse gaseous distention of small bowel loops question
postoperative ileus.

## 2020-04-19 DIAGNOSIS — Z6827 Body mass index (BMI) 27.0-27.9, adult: Secondary | ICD-10-CM | POA: Diagnosis not present

## 2020-04-19 DIAGNOSIS — M542 Cervicalgia: Secondary | ICD-10-CM | POA: Diagnosis not present

## 2020-04-19 DIAGNOSIS — Z7952 Long term (current) use of systemic steroids: Secondary | ICD-10-CM | POA: Diagnosis not present

## 2020-04-19 DIAGNOSIS — R768 Other specified abnormal immunological findings in serum: Secondary | ICD-10-CM | POA: Diagnosis not present

## 2020-04-19 DIAGNOSIS — M353 Polymyalgia rheumatica: Secondary | ICD-10-CM | POA: Diagnosis not present

## 2020-04-19 DIAGNOSIS — S32010D Wedge compression fracture of first lumbar vertebra, subsequent encounter for fracture with routine healing: Secondary | ICD-10-CM | POA: Diagnosis not present

## 2020-04-19 DIAGNOSIS — M8080XD Other osteoporosis with current pathological fracture, unspecified site, subsequent encounter for fracture with routine healing: Secondary | ICD-10-CM | POA: Diagnosis not present

## 2020-04-19 DIAGNOSIS — E663 Overweight: Secondary | ICD-10-CM | POA: Diagnosis not present

## 2020-04-19 DIAGNOSIS — M25511 Pain in right shoulder: Secondary | ICD-10-CM | POA: Diagnosis not present

## 2020-04-19 DIAGNOSIS — J84112 Idiopathic pulmonary fibrosis: Secondary | ICD-10-CM | POA: Diagnosis not present

## 2020-05-11 DIAGNOSIS — I4581 Long QT syndrome: Secondary | ICD-10-CM | POA: Diagnosis not present

## 2020-05-11 DIAGNOSIS — I2510 Atherosclerotic heart disease of native coronary artery without angina pectoris: Secondary | ICD-10-CM | POA: Diagnosis not present

## 2020-05-11 DIAGNOSIS — Z6826 Body mass index (BMI) 26.0-26.9, adult: Secondary | ICD-10-CM | POA: Diagnosis not present

## 2020-05-11 DIAGNOSIS — F5101 Primary insomnia: Secondary | ICD-10-CM | POA: Diagnosis not present

## 2020-05-11 DIAGNOSIS — Z433 Encounter for attention to colostomy: Secondary | ICD-10-CM | POA: Diagnosis not present

## 2020-05-11 DIAGNOSIS — B370 Candidal stomatitis: Secondary | ICD-10-CM | POA: Diagnosis not present

## 2020-05-11 DIAGNOSIS — K5780 Diverticulitis of intestine, part unspecified, with perforation and abscess without bleeding: Secondary | ICD-10-CM | POA: Diagnosis not present

## 2020-05-11 DIAGNOSIS — Z48815 Encounter for surgical aftercare following surgery on the digestive system: Secondary | ICD-10-CM | POA: Diagnosis not present

## 2020-05-11 DIAGNOSIS — R0602 Shortness of breath: Secondary | ICD-10-CM | POA: Diagnosis not present

## 2020-05-11 DIAGNOSIS — R945 Abnormal results of liver function studies: Secondary | ICD-10-CM | POA: Diagnosis not present

## 2020-05-11 DIAGNOSIS — M25519 Pain in unspecified shoulder: Secondary | ICD-10-CM | POA: Diagnosis not present

## 2020-05-11 DIAGNOSIS — E0965 Drug or chemical induced diabetes mellitus with hyperglycemia: Secondary | ICD-10-CM | POA: Diagnosis not present

## 2020-05-12 DIAGNOSIS — I2510 Atherosclerotic heart disease of native coronary artery without angina pectoris: Secondary | ICD-10-CM | POA: Diagnosis not present

## 2020-05-12 DIAGNOSIS — M353 Polymyalgia rheumatica: Secondary | ICD-10-CM | POA: Diagnosis not present

## 2020-05-12 DIAGNOSIS — J849 Interstitial pulmonary disease, unspecified: Secondary | ICD-10-CM | POA: Diagnosis not present

## 2020-05-12 DIAGNOSIS — R0602 Shortness of breath: Secondary | ICD-10-CM | POA: Diagnosis not present

## 2020-05-12 DIAGNOSIS — E782 Mixed hyperlipidemia: Secondary | ICD-10-CM | POA: Diagnosis not present

## 2020-05-12 DIAGNOSIS — H35369 Drusen (degenerative) of macula, unspecified eye: Secondary | ICD-10-CM | POA: Diagnosis not present

## 2020-05-12 DIAGNOSIS — I482 Chronic atrial fibrillation, unspecified: Secondary | ICD-10-CM | POA: Diagnosis not present

## 2020-05-12 DIAGNOSIS — G4730 Sleep apnea, unspecified: Secondary | ICD-10-CM | POA: Diagnosis not present

## 2020-05-12 DIAGNOSIS — I10 Essential (primary) hypertension: Secondary | ICD-10-CM | POA: Diagnosis not present

## 2020-05-12 DIAGNOSIS — F5101 Primary insomnia: Secondary | ICD-10-CM | POA: Diagnosis not present

## 2020-05-12 DIAGNOSIS — E0965 Drug or chemical induced diabetes mellitus with hyperglycemia: Secondary | ICD-10-CM | POA: Diagnosis not present

## 2020-05-12 DIAGNOSIS — M25519 Pain in unspecified shoulder: Secondary | ICD-10-CM | POA: Diagnosis not present

## 2020-05-12 DIAGNOSIS — M545 Low back pain, unspecified: Secondary | ICD-10-CM | POA: Diagnosis not present

## 2020-05-18 ENCOUNTER — Other Ambulatory Visit: Payer: Self-pay

## 2020-05-18 ENCOUNTER — Encounter: Payer: Self-pay | Admitting: Cardiovascular Disease

## 2020-05-18 ENCOUNTER — Ambulatory Visit: Payer: PPO | Admitting: Cardiovascular Disease

## 2020-05-18 VITALS — BP 155/93 | HR 80 | Temp 97.2°F | Ht 68.0 in | Wt 188.2 lb

## 2020-05-18 DIAGNOSIS — Z7901 Long term (current) use of anticoagulants: Secondary | ICD-10-CM

## 2020-05-18 DIAGNOSIS — G4733 Obstructive sleep apnea (adult) (pediatric): Secondary | ICD-10-CM | POA: Diagnosis not present

## 2020-05-18 DIAGNOSIS — Z8679 Personal history of other diseases of the circulatory system: Secondary | ICD-10-CM

## 2020-05-18 DIAGNOSIS — I480 Paroxysmal atrial fibrillation: Secondary | ICD-10-CM | POA: Diagnosis not present

## 2020-05-18 DIAGNOSIS — I10 Essential (primary) hypertension: Secondary | ICD-10-CM

## 2020-05-18 DIAGNOSIS — E782 Mixed hyperlipidemia: Secondary | ICD-10-CM

## 2020-05-18 DIAGNOSIS — J849 Interstitial pulmonary disease, unspecified: Secondary | ICD-10-CM | POA: Diagnosis not present

## 2020-05-18 MED ORDER — ROSUVASTATIN CALCIUM 10 MG PO TABS: 5.0000 mg | ORAL_TABLET | ORAL | 0 refills | Status: DC

## 2020-05-18 MED ORDER — DILTIAZEM HCL ER COATED BEADS 240 MG PO CP24: 240.0000 mg | ORAL_CAPSULE | Freq: Every day | ORAL | 3 refills | Status: DC

## 2020-05-18 NOTE — Progress Notes (Signed)
[name], male   DOB: 12-22-1945, 75 y.o.   MRN: [id number]    Primary M.D.: Dr. Wende Neighbors  HPI: [name] is a 75 y.o. male who presents for a 92 month cardiology follow-up evaluation.   [name] has remote history of viral myocarditis in 1989 at which time his ejection fraction was 25%. He subsequently normalized LV function. He has a history of mild mitral valve prolapse. Cardiac catheterization in 2001 showed mild mid systolic LAD bridging. He has been noted to have mild T-wave changes on his ECG. Additional problems include hypertension as well as hyperlipidemia. He was unable to tolerate Lipitor. He initially tolerated Crestor but then developed significant myalgias.  He has been able to tolerate Zetia 10 mg daily.   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  Apparently, this has stabilized and is tired.  Ears have almost normalized.  He has been starting to resume very small amounts of red meat.    Past year, he has remained active.  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  He denies any episodes of chest pain, or change in exercise tolerance.  On 02/01/2015.  He underwent lumbar surgery by [name] involving L2-L3, L3-L4, and L4-L5.  He tolerated surgery without cardiovascular compromise.  When I  saw him in January 2017 he was on amlodipine 2.5 mg and atenolol 25 g twice a day, which has been helpful for his blood pressure, palpitations, as well as documented systolic LAD muscle bridging.    At a subsequent office visit in November 2017 he underwent an echo Doppler study which was done on 03/22/2016 and this showed normal systolic function with an EF of 60-65% with grade 1 diastolic dysfunction.  His aorta was upper normal to mildly dilated at 38 mm.  There was no significant valvular pathology.  There was no TR Doppler jets an estimated PA systolic pressure was not able to be  obtained.  Due to his shortness of breath.  He also underwent a CT of his chest.  He had dependent linear and platelike atelectasis in lower lungs bilaterally.  The patient has a remote exposure to asbestosis in his 43s, but his CT scan did not reveal any evidence for pleural based calcification.  There were diffuse atelectatic changes in the lungs bilaterally.  He also underwent a nuclear perfusion study which showed normal perfusion and function without scar or ischemia.  I had sent off laboratory was notable for a normal BMP 53.  Troponins were negative.  He had been prescribed levaquin by his primary physician as empiric therapy for a mild fever.  His fever resolved but he continues to experience shortness of breath.  His white blood count 13 days ago was 11.2.  He was macrocytic with an MCV of 102 with a normal hemoglobin at 16.2, hematocrit of 46.0.  B12 and folate levels were normal.  An erythrocyte sedimentation rate was increased at 61 and a high-sensitivity C-reactive protein was significantly increased at 139.    I referred him to [name] for pulmonary evaluation for possible interstitial lung disease and also [name] for rheumatologic follow-up.  He was given increased steroids.  In November 2017.  A high resolution CT scan of the chest showed patchy groundglass bilaterally andpatchy lower lobe atelectasis.  A barium swallow did not show evidence for esophageal disorder.  Antineutrophil cytoplasmic antibody testing was negative.  A follow-up high-resolution CT was done on June 12, 2016, which showed mild basilar predominant subpleural reticulation and groundglass suggesting nonspecific interstitial pneumonitis, however, will interstitial pneumonitis could not be excluded.  He was also noted to have aortic atherosclerosis and a punctate stone in his left kidney.  When seen in 2018 he felt improved.  Subsequent laboratory had shown a sedimentation rate which improved from 61 to  1 and CRP  which had improved from 139 to 6.8.  His lipids were elevated with a total cholesterol 222, triglycerides 359, HDL 67, VLDL 72, and LDL 83.  He developed myalgias with Crestor and Lipitor.  He denies chest pain. He is concerned about the cost of soe ofhis medicatios, partiyby.  He continues to be on pradaxa for anticoagulation and long-acting Cardizem.  He is unaware of any recent arrhythmia.  He had follow-up blood work on 01/15/2017 by Wende Neighbors in [place].  Hemoglobin 17.5, hematocrit 48.7.  Lipid studies were improved with a total cholesterol 220, triglycerides 175, HDL 72, LDL 113.  Hemoglobin A1c was 5.7.  He is on CPAP therapy.  He nasal pillows, small size, the D[employer] [facility].  He has undergone more extensive pulmonary evaluation for his diffuse parenchymal lung disease of undetermined cause.  The February 19, 2018 he underwent an open lung biopsy by Dr. Roxan Hockey and there was some suggestion of possible early fibrotic process suggestive of UIP.  Subsequently, his biopsy was evaluated at [facility] by Dr. Reggie Pile.  He was felt to have chronic fibrosing interstitial pneumonia with an unclassifiable pattern.  They did query the possibility of association with an underlying systemic inflammatory or connective tissue disorder.  They did not believe he had UIP. He has also undergone follow-up rheumatologic evaluation.  He was started on a trial of Ofev.  When I  saw him in December 2019 he denied any chest pain or palpitations, PND orthopnea,  presyncope or syncope. He was not short of breath at rest but does get short of breath with mild activity. He was on chronic steroids and inhalers and was on Cellcept and Ofev, followed by Dr Lake Bells.  He was admitted to the hospital on 06/15/2018 with acute diverticulitis requiring exploratory laparotomy and colostomy.The pt was taken off Cellcept and Ofev on admissionPostoperatively his QTC was noted to be prolonged-his QTC on 06/24/2018 was 624.  Antibiotics were adjusted.He saw Kerin Ransom, [facility]  for further evaluation on 06/25/2018. and f/u EKG. His QTC  in the office was 447. He is slowly improving. No palpitations or tachycardia, no unusual dyspnea. He has noted his B/P has been running [id number] systolic.  I evaluated  him in May 2020 with a telemedicine visit.  At that time, he told me that he will be in need for reversal of his colostomy with colostomy takedown and sometime this year will also require back surgery by Dr. Ellene Route.  His lung disease has improved with reinstitution of CellCept.  He denied any chest pain PND orthopnea.  He denies any palpitations.  He was seen 1 month later for follow-up evaluation and his breathing was significantly improved.  He was able to walk for significant duration recently went and noticed significant improvement from previously.  He is scheduled to see his primary physician later this week and had laboratory drawn on Oct 09, 2018.  Hemoglobin 16.1 hematocrit 47.5.  MCV had improved to 102 from a high of 110.  Renal function was stable with a creatinine of 0.83.  LFTs  were normal.  Total cholesterol was increased to 260 but triglycerides were improved at 145, LDL was 115 HDL cholesterol was excellent at 116.  Hemoglobin A1c was 6.0.  He underwent successful reversal of his colostomy with Dr. Marlou Starks.  He was evaluated in December 2020 by Coletta Memos, NP after experiencing episodes of some sharp chest pain with bending over.  He was not felt to have ischemic chest pain.  His activity has been limited due to an L1 fracture and he has been wearing a lumbar brace.  He has been followed by Dr. Ellene Route.   He was unaware of recurrent atrial fibrillation has been maintained on Cartia XT 120 mg, Quist 5 mg twice a day.  He continues to be on Ofev for his interstitial lung disease.  He had been given some samples of combination Zetia and bempedoic acid by Dr. Nevada Crane but had run out of these and was concerned about  potential cost.   When I last saw him in January 2021 I provided him with samples of combination bempedoic acid and Zetia with nasal is at 180/10 mg. Apparently, once the samples ran out, his insurance company denied paying for these since he does not have previously diagnosed significant CAD. Since I saw him, he developed progressive symptoms from his cervical spondylosis with myelopathy at C3-C4 cervical radiculopathy and underwent anterior cervical decompression C3-C4 arthrodesis with structural allograft anterior plate fixation by [name] on November 11, 2019. He is now followed by Dr. Chase Caller for his interstitial lung disease and has had subsequent pulmonary function tests. He is no longer on Ofev. He is unaware of any breakthrough atrial fibrillation. He continues to be on diltiazem 120 mg and metoprolol tartrate 25 mg twice a day for hypertension. He is on Eliquis 5 mg twice a day. He continues to be on Zetia 10 mg. He had undergone laboratory by Dr. Eddie Dibbles in August 2021 and lipids were elevated with total cholesterol 262, triglycerides 199, LDL 143 and HDL 42. Hemoglobin A1c had increased to 6.6 from 5.7. He denies chest pain. He tells me he will be retiring after completion of his last project. He presents for evaluation.  Past Medical History:  Diagnosis Date  . Allergy to alpha-gal    2015; has been able to re-introduce red meat for the past 2 1/2 years without reaction (as of 01/21/19)  . Arthritis   . Chest pain    a. 03/2017: echo showing EF of 60-65%, no regional WMA or significant valve abnormalities. b. 03/2016: NST with no evidence of ischemia.   . Complication of anesthesia    "anaphylactic" reaction after given xylocaine for shoulder injection > 10 years ago; reported no known reaction when given marcaine (as of 01/21/19)   . Digestive problems    on Prednisone prn for this issue- diarrhea  . Dyspnea    with activity  . Dysrhythmia    irregular due to Myocarditis- takes  Atenolol, Norvasc  . GERD (gastroesophageal reflux disease)   . H/O viral myocarditis    25 years ago  . Head injury, closed, with concussion    Breif LOC  . Hyperlipidemia   . Mild mitral valve prolapse    per Dr Evette Georges notes  . PAF (paroxysmal atrial fibrillation) ([place])    a. initially occuring in 02/2016. b. recurrent in 07/2016. Placed on Eliquis  . Polymyalgia rheumatica ([place])   . Pre-diabetes   . Pulmonary fibrosis ([place])   . Sleep apnea  mild sleep apnea   no CPAP    Past Surgical History:  Procedure Laterality Date  . ANTERIOR CERVICAL DECOMP/DISCECTOMY FUSION N/A 11/10/2019   Procedure: Cervical Three-Four Anterior cervical decompression/discectomy/fusion;  Surgeon: [name], MD;  Location: [place];  Service: Neurosurgery;  Laterality: N/A;  Cervical Three-Four Anterior cervical decompression/discectomy/fusion  . apendectomy  1965  . APPENDECTOMY    . BACK SURGERY    . bone spur Bilateral 1999   feet  . CARDIAC CATHETERIZATION  2001  . CHOLECYSTECTOMY  2004  . COLON RESECTION N/A 06/15/2018   Procedure: SIGMOID COLON RESECTION;  Surgeon: [name], MD;  Location: [facility];  Service: General;  Laterality: N/A;  . COLONOSCOPY    . COLOSTOMY N/A 06/15/2018   Procedure: COLOSTOMY;  Surgeon: [name], MD;  Location: [facility];  Service: General;  Laterality: N/A;  . COLOSTOMY TAKEDOWN N/A 01/26/2019   Procedure: LAPAROSCOPIC ASSISTED COLOSTOMY TAKEDOWN;  Surgeon: [name], MD;  Location: [place];  Service: General;  Laterality: N/A;  . EYE SURGERY Bilateral    cataract removal  . LAPAROSCOPIC LYSIS OF ADHESIONS N/A 01/26/2019   Procedure: LAPAROSCOPIC LYSIS OF ADHESIONS;  Surgeon: [name], MD;  Location: [place];  Service: General;  Laterality: N/A;  . LAPAROTOMY N/A 06/15/2018   Procedure: EXPLORATORY LAPAROTOMY;  Surgeon: [name], MD;  Location: [facility];  Service: General;  Laterality: N/A;  . LUMBAR LAMINECTOMY/ DECOMPRESSION WITH MET-RX Right  05/05/2013   Procedure: Right Lumbar three-four Extraforaminal Microdiskectomy with Metrex;  Surgeon: [name], MD;  Location: [facility];  Service: Neurosurgery;  Laterality: Right;  Right Lumbar three-four Extraforaminal Microdiskectomy with Metrex  . LUNG BIOPSY Right 02/19/2018   Procedure: LUNG BIOPSY;  Surgeon: [name], MD;  Location: [place];  Service: Thoracic;  Laterality: Right;  . SHOULDER OPEN ROTATOR CUFF REPAIR Bilateral 2001  . TONSILLECTOMY    . VIDEO ASSISTED THORACOSCOPY Right 02/19/2018   Procedure: VIDEO ASSISTED THORACOSCOPY;  Surgeon: [name], MD;  Location: [place];  Service: Thoracic;  Laterality: Right;    Allergies  Allergen Reactions  . Xylocaine [Lidocaine] Anaphylaxis  . Codeine Nausea And Vomiting  . Statins Other (See Comments)    Muscle soreness and an aching feeling all over Myalgias  . Percocet [Oxycodone-Acetaminophen] Itching    Current Outpatient Medications  Medication Sig Dispense Refill  . acetaminophen (TYLENOL) 650 MG CR tablet Take 1,300 mg by mouth every 8 (eight) hours as needed for pain.    . Ascorbic Acid (VITAMIN C) 1000 MG tablet Take 1,000 mg by mouth daily.    . cetirizine (ZYRTEC) 10 MG tablet Take 10 mg by mouth daily as needed for allergies.     . Cholecalciferol (DIALYVITE VITAMIN D 5000) 125 MCG (5000 UT) capsule Take 5,000 Units by mouth daily.    . cholecalciferol (VITAMIN D3) 25 MCG (1000 UNIT) tablet Take 1,000 Units by mouth every evening.    . diclofenac Sodium (VOLTAREN) 1 % GEL Apply 1 application topically 4 (four) times daily as needed (pain).    Marland Kitchen diphenoxylate-atropine (LOMOTIL) 2.5-0.025 MG tablet Take 1 tablet by mouth 4 (four) times daily as needed for diarrhea or loose stools.    Marland Kitchen ELIQUIS 5 MG TABS tablet TAKE ONE TABLET (5MG TOTAL) BY MOUTH TWODAILY (Patient taking differently: Take 5 mg by mouth 2 (two) times daily.) 180 tablet 1  . ezetimibe (ZETIA) 10 MG tablet Take 10 mg by mouth  daily.    Marland Kitchen  fluticasone (FLONASE) 50 MCG/ACT nasal spray Place 2 sprays into both nostrils daily as needed for allergies or rhinitis.    Marland Kitchen gabapentin (NEURONTIN) 300 MG capsule Take 300 mg by mouth 3 (three) times daily.     . metoprolol tartrate (LOPRESSOR) 25 MG tablet TAKE ONE TABLET BY MOUTH TWICE A DAY (Patient taking differently: Take 25 mg by mouth 2 (two) times daily.) 180 tablet 3  . nystatin (MYCOSTATIN) 100000 UNIT/ML suspension Take 5 mLs (500,000 Units total) by mouth every other day. (Patient taking differently: Take 5 mLs by mouth daily as needed (thrush).) 437 mL 11  . Omega-3 Fatty Acids (FISH OIL) 500 MG CAPS Take 500 mg by mouth daily.     . ondansetron (ZOFRAN) 4 MG tablet Take 4 mg by mouth every 8 (eight) hours as needed for nausea or vomiting.     . predniSONE (DELTASONE) 5 MG tablet Take 5 mg by mouth daily.     . rosuvastatin (CRESTOR) 10 MG tablet Take 0.5 tablets (5 mg total) by mouth once a week. 20 tablet 0  . tamsulosin (FLOMAX) 0.4 MG CAPS capsule Take 0.4 mg by mouth 2 (two) times daily.     Marland Kitchen umeclidinium-vilanterol (ANORO ELLIPTA) 62.5-25 MCG/INH AEPB Inhale 1 puff into the lungs daily. 60 each 3  . diltiazem (CARDIZEM CD) 240 MG 24 hr capsule Take 1 capsule (240 mg total) by mouth daily. 90 capsule 3   No current facility-administered medications for this visit.    Social History   Socioeconomic History  . Marital status: Married    Spouse name: Not on file  . Number of children: Not on file  . Years of education: Not on file  . Highest education level: Not on file  Occupational History  . Not on file  Tobacco Use  . Smoking status: Never Smoker  . Smokeless tobacco: Never Used  Vaping Use  . Vaping Use: Never used  Substance and Sexual Activity  . Alcohol use: Yes    Alcohol/week: 21.0 standard drinks    Types: 21 Glasses of wine per week    Comment: 2/3 glasses wine in evening  . Drug use: No  . Sexual activity: Yes    Birth  control/protection: Other-see comments    Comment: old age  Other Topics Concern  . Not on file  Social History Narrative  . 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 He is married has 2 children. There is no tobacco use. He stays active. There is no alcohol use.  He is still working but had a significantly reduced pace than he had previously.  Family history is notable that both parents are deceased.  Mother died of old age.  Father died but had a history of mitral valve prolapse.  He has 2 brothers and one sister who are  alive and well.  ROS General: Negative; No fevers, chills, or night sweats;  HEENT: Negative; No changes in vision or hearing, sinus congestion, difficulty swallowing Pulmonary:  shortness of breath with activity; undergoing evaluation for chronic fibrosing interstitial lung disease Cardiovascular: See history of present illness GI: Status post recent sigmoid colon perforation requiring emergent surgery with subsequent peritonitis and  colostomy insertion. GU: Negative; No dysuria, hematuria, or difficulty voiding Musculoskeletal: Negative; no myalgias, joint pain, or weakness Hematologic/Oncology: Negative; no easy bruising, bleeding Endocrine: Negative; no heat/cold intolerance; no diabetes Neuro: Negative; no changes in balance, headaches Skin: Negative; No rashes or skin lesions Psychiatric: Negative; No behavioral problems, depression Sleep: Positive for OSA O on a home sleep study at [facility] pulmonary negative;  Other comprehensive 14 point system review is negative.  PE BP (!) 155/93   Pulse 80   Temp (!) 97.2 F (36.2 C)   Ht _0  (1.727 m)   Wt 188 lb 3.2 oz (85.4 kg)   SpO2 93%    BMI 28.62 kg/m   Repeat blood pressure by me was 148/86  Wt Readings from Last 3 Encounters:  05/18/20 188 lb 3.2 oz (85.4 kg)  12/30/19 183 lb (83 kg)  11/24/19 180 lb 12.8 oz (82 kg)   General: Alert, oriented, no distress.  Skin: normal turgor, no rashes, warm and dry HEENT: Normocephalic, atraumatic. Pupils equal round and reactive to light; sclera anicteric; extraocular muscles intact;  Nose without nasal septal hypertrophy Mouth/Parynx benign; Mallinpatti scale 3 Neck: No JVD, no carotid bruits; normal carotid upstroke Lungs: clear to ausculatation and percussion; no wheezing or rales Chest wall: without tenderness to palpitation Heart: PMI not displaced, RRR, s1 s2 normal, 1/6 systolic murmur, no diastolic murmur, no rubs, gallops, thrills, or heaves Abdomen: soft, nontender; no hepatosplenomehaly, BS+; abdominal aorta nontender and not dilated by palpation. Back: no CVA tenderness Pulses 2+ Musculoskeletal: full range of motion, normal strength, no joint deformities Extremities: no clubbing cyanosis or edema, Homan's sign negative  Neurologic: grossly nonfocal; Cranial nerves grossly wnl Psychologic: Normal mood and affect   ECG (independently read by me): NSR at 80; normal intervals, no ectopy  May 26, 2019 ECG (independently read by me): Normal sinus rhythm at 87 bpm.  Mild RV conduction delay.  No ectopy.  Low voltage.  Normal intervals  June 2020 ECG (independently read by me): Normal sinus rhythm at 70 bpm.  Incomplete right bundle branch block.  QTc interval normal at 432 ms  06/25/2018 ECG (independently read by me): Normal sinus rhythm at 79 bpm.  QTc interval _1 ms.  05/12/2019 ECG (independently read by me): Normal sinus rhythm at 80 bpm.  No ectopy.  Normal intervals.  September 2018 ECG (independently read by me): Normal sinus rhythm at 65 bpm.  Isolated PAC.  Incomplete right bundle branch block.  Normal intervals.  March 2018 ECG (independently read  by me): Normal sinus rhythm at 79 bpm.  QTc interval 460 ms.  PR interval normal at 158 ms.  November 2017 ECG (independently read by me): Normal sinus rhythm at 99 bpm, incomplete right bundle branch block.  Nondiagnostic ST-T changes.  January 2017 ECG (independently read by me): Normal sinus rhythm at 82 bpm.  Mild RV conduction delay.  Normal intervals.  No significant ST-T changes.  December 2015 ECG (independently read by me): Normal sinus rhythm at 74 bpm.  Mild RV conduction delay.  QTc interval 461 ms.  December 2014 ECG: Sinus rhythm at 57 beats per minute. No ectopy. Normal intervals.  LABS:  BMP Latest Ref Rng & Units 11/06/2019 10/22/2019 07/21/2019  Glucose 70 - 99 mg/dL 159(H) 101(H) 131(H)  BUN 8 - 23 mg/dL _2 Creatinine 0.61 - 1.24 mg/dL 0.83 0.86 0.91  Sodium 135 - 145 mmol/L 138 135 139  Potassium 3.5 - 5.1 mmol/L  4.0 4.0 4.3  Chloride 98 - 111 mmol/L 102 102 103  CO2 22 - 32 mmol/L _0 Calcium 8.9 - 10.3 mg/dL 9.2 9.0 9.5   Hepatic Function Latest Ref Rng & Units 10/22/2019 07/21/2019 01/15/2019  Total Protein 6.0 - 8.3 g/dL 6.3 6.1 5.9(L)  Albumin 3.5 - 5.2 g/dL 4.2 3.8 3.6  AST 0 - 37 U/L _1 ALT 0 - 53 U/L _2 Alk Phosphatase 39 - 117 U/L 56 52 55  Total Bilirubin 0.2 - 1.2 mg/dL 1.2 1.0 1.4(H)  Bilirubin, Direct 0.0 - 0.3 mg/dL 0.2 - -   CBC Latest Ref Rng & Units 11/06/2019 10/22/2019 01/29/2019  WBC 4.0 - 10.5 K/uL 8.8 7.7 11.6(H)  Hemoglobin 13.0 - 17.0 g/dL 16.9 16.2 13.4  Hematocrit 39.0 - 52.0 % 50.1 47.9 37.5(L)  Platelets 150 - 400 K/uL 210 213.0 180   Lab Results  Component Value Date   MCV 103.7 (H) 11/06/2019   MCV 104.5 (H) 10/22/2019   MCV 105.9 (H) 01/29/2019   Erythrocyte Sedimentation Rate     Component Value Date/Time   ESRSEDRATE 2 10/22/2019 1709   CRP November 2017: 139  Repeat CRP 07/16/16:  6  Lab Results  Component Value Date   TSH 2.31 07/16/2016     Lab Results  Component Value Date   HGBA1C 6.9 (H)  02/17/2018     Lipid Panel     Component Value Date/Time   CHOL 222 (H) 07/16/2016 0902   TRIG 359 (H) 07/16/2016 0902   HDL 67 07/16/2016 0902   CHOLHDL 3.3 07/16/2016 0902   VLDL 72 (H) 07/16/2016 0902   LDLCALC 83 07/16/2016 0902   ------------------------------------------------------------------------------------------------------------- Oxygen Saturation: Rest: 92% with pulse of 93  Following walking in the office: 93% with a pulse of 106  03/21/2016 Nuclear Study Highlights    The left ventricular ejection fraction is normal (55-65%).  Nuclear stress EF: 61%.  The study is normal.  This is a low risk study.  There was no ST segment deviation noted during stress.     ------------------------------------------------------------------- 03/22/2016 ECHO Study Conclusions  - Left ventricle: The cavity size was normal. Wall thickness was   normal. Systolic function was normal. The estimated ejection   fraction was in the range of 60% to 65%. Wall motion was normal;   there were no regional wall motion abnormalities. Doppler   parameters are consistent with abnormal left ventricular   relaxation (grade 1 diastolic dysfunction). - Aortic valve: There was no stenosis. - Aorta: Mildly dilated aortic root. Aortic root dimension: 38 mm   (ED). - Mitral valve: There was no significant regurgitation. - Right ventricle: The cavity size was normal. Systolic function   was normal. - Pulmonary arteries: No complete TR doppler jet so unable to   estimate PA systolic pressure. - Inferior vena cava: The vessel was normal in size. The   respirophasic diameter changes were in the normal range (>= 50%),   consistent with normal central venous pressure.  Impressions:  - Normal LV size with EF 60-65%. Normal RV size and systolic   function. No significant valvular  abnormalities.  --------------------------------------------------------------------------------------------  04/02/2016 EXAM: CT CHEST WITHOUT CONTRAST  TECHNIQUE: Multidetector CT imaging of the chest was performed following the standard protocol without IV contrast.  COMPARISON:  None.  FINDINGS: Cardiovascular: The heart size is normal. No pericardial effusion. Atherosclerotic calcification is noted in the wall of the thoracic aorta.  Mediastinum/Nodes: No  mediastinal lymphadenopathy. No evidence for gross hilar lymphadenopathy although assessment is limited by the lack of intravenous contrast on today's study. There is no axillary lymphadenopathy.  Lungs/Pleura: Fine detail obscured by breathing motion. Dependent linear and platelike atelectasis noted in the lower lungs bilaterally. Components of associated parenchymal scarring not excluded. No evidence for pulmonary edema. No dense focal airspace consolidation. No evidence for pleural effusion.  Upper Abdomen: The liver shows diffusely decreased attenuation suggesting steatosis. Gallbladder surgically absent.  Musculoskeletal: Degenerative changes noted in the shoulders bilaterally.  IMPRESSION: Relatively diffuse atelectatic changes in the lungs bilaterally. Study degraded by patient breathing motion.  No specific findings to explain the patient's history of shortness of breath.   06/13/2016 CT Chest High Resolution  FINDINGS: Cardiovascular: Atherosclerotic calcification of the arterial vasculature. Heart size normal. No pericardial effusion.  Mediastinum/Nodes: No pathologically enlarged mediastinal or axillary lymph nodes. Hilar regions are difficult to evaluate without IV contrast. Esophagus is grossly unremarkable.  Lungs/Pleura: Mild basilar predominant subpleural reticulation and ground-glass with minimal architectural distortion. No definite traction bronchiectasis/bronchiolectasis or  honeycombing. Findings are likely unchanged from 04/02/2016 but there was a great deal of respiratory motion on that exam. There may be minimal air trapping. No pleural fluid. Airway is unremarkable.  Upper Abdomen: Visualized portions of the liver, adrenal glands and right kidney are grossly unremarkable. Punctate stone in the left kidney. Visualized portions of the spleen, pancreas, stomach and bowel are grossly unremarkable. Cholecystectomy. No upper abdominal adenopathy.  Musculoskeletal: No worrisome lytic or sclerotic lesions. Degenerative changes are seen in the spine.  IMPRESSION: 1. Mild basilar predominant subpleural reticulation and ground-glass, suggesting nonspecific interstitial pneumonitis. Usual interstitial pneumonitis cannot be excluded on this baseline examination. 2.  Aortic atherosclerosis (ICD10-170.0). 3. Punctate stone left kidney.   IMPRESSION:  1. Essential hypertension   2. PAF (paroxysmal atrial fibrillation) (HCC)   3. ILD (interstitial lung disease) (HCC)   4. OSA (obstructive sleep apnea)   5. Mixed hyperlipidemia   6. History of viral myocarditis: 1989   7. Anticoagulated     ASSESSMENT AND PLAN: 1.  CAD: Mild mid systolic LAD bridging noted at catheterization in 2001. No current anginal symptoms. He has tolerated his recent surgeries without cardiovascular compromise.  2. Essential hypertension: Blood pressure today is elevated and initially was 155/93 and a repeat by me was 148/86. He has been on metoprolol tartrate 25 mg twice a day in addition to diltiazem long-acting 120 mg. I have recommended titration of diltiazem to 240 mg daily. We will continue metoprolol at the present dose but in the future this may be able to be changed to metoprolol succinate.  3. PAF: He is maintaining sinus rhythm. He continues to be on Eliquis for anticoagulation. He continues to be on diltiazem and metoprolol. As noted above diltiazem dose will be  increased to 240 mg for more optimal blood pressure control.  4. Anticoagulation: He is tolerating Eliquis 5 mg twice a day. No bleeding.  5.  Fibrosing nonspecific interstitial pneumonitis followed previously by Dr. Kendrick Fries. He is now followed by Dr. Marchelle Gearing. Plans are for follow-up pulmonary function tests in February. He is no longer on Ofev.  6.  History of viral myocarditis, EF 20 to 25% in 1988; most recent echo in May 2021 showed an EF at 60 to 65%. He had normal pulmonary pressures. There was no significant valvular abnormality.  7.  Mixed hyperlipidemia: He has been on Zetia and was started on omega-3 fatty acid.  Prior lipid studies  showed a total cholesterol 260 with LDL 115.  HDL is excellent at 116, triglycerides have improved at 145.  Most recent studies obtained by Dr. Wende Neighbors showed total cholesterol 277, triglycerides 174, LDL 156 and HDL 90 from April 15, 2019. When I last saw him I gave him 1 month samples of combination therapy with bempedoic acid and Zetia. Unfortunately insurance denied payment since he does not have significant CAD. After much discussion, I will try instituting very low-dose rosuvastatin 5 mg 1 day a week for the next month and he will continue with daily 10 mg Zetia. If he tolerates the 5 mg weekly dose, he will then increase this to either 5 mg twice a week or 10 mg weekly.  8.  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  9.  OSA on [place] home sleep study study July 2018.  AHI 27.4 with oxygen desaturation to a nadir of 81%; followed by [facility].   Troy Sine, MD, [facility]  05/19/2020 5:54 PM

## 2020-05-18 NOTE — Patient Instructions (Signed)
Medication Instructions:  Increase your diltiazem to 240 mg once a day. Take rosuvastatin 5 mg (half a tablet) once a week *If you need a refill on your cardiac medications before your next appointment, please call your pharmacy*   Lab Work: None ordered If you have labs (blood work) drawn today and your tests are completely normal, you will receive your results only by: Marland Kitchen MyChart Message (if you have MyChart) OR . A paper copy in the mail If you have any lab test that is abnormal or we need to change your treatment, we will call you to review the results.   Testing/Procedures: None ordered   Follow-Up: At [facility], you and your health needs are our priority.  As part of our continuing mission to provide you with exceptional heart care, we have created designated Provider Care Teams.  These Care Teams include your primary Cardiologist (physician) and Advanced Practice Providers (APPs -  Physician Assistants and Nurse Practitioners) who all work together to provide you with the care you need, when you need it.  We recommend signing up for the patient portal called "MyChart".  Sign up information is provided on this After Visit Summary.  MyChart is used to connect with patients for Virtual Visits (Telemedicine).  Patients are able to view lab/test results, encounter notes, upcoming appointments, etc.  Non-urgent messages can be sent to your provider as well.   To learn more about what you can do with MyChart, go to [email or web address].    Your next appointment:   6 month(s)  The format for your next appointment:   In Person  Provider:   Nicki Guadalajara, MD   Other Instructions None

## 2020-05-19 ENCOUNTER — Encounter: Payer: Self-pay | Admitting: Cardiovascular Disease

## 2020-06-11 DIAGNOSIS — E1169 Type 2 diabetes mellitus with other specified complication: Secondary | ICD-10-CM | POA: Diagnosis not present

## 2020-06-11 DIAGNOSIS — J8410 Pulmonary fibrosis, unspecified: Secondary | ICD-10-CM | POA: Diagnosis not present

## 2020-06-11 DIAGNOSIS — G729 Myopathy, unspecified: Secondary | ICD-10-CM | POA: Diagnosis not present

## 2020-06-11 DIAGNOSIS — I4891 Unspecified atrial fibrillation: Secondary | ICD-10-CM | POA: Diagnosis not present

## 2020-06-20 ENCOUNTER — Other Ambulatory Visit: Payer: Self-pay | Admitting: Cardiovascular Disease

## 2020-06-29 ENCOUNTER — Other Ambulatory Visit: Payer: Self-pay

## 2020-06-29 ENCOUNTER — Ambulatory Visit
Admission: RE | Admit: 2020-06-29 | Discharge: 2020-06-29 | Disposition: A | Discharge status: Home / Self Care | Payer: PPO | Source: Ambulatory Visit | Attending: Internal Medicine | Admitting: Internal Medicine

## 2020-06-29 ENCOUNTER — Other Ambulatory Visit: Payer: PPO

## 2020-06-29 DIAGNOSIS — J849 Interstitial pulmonary disease, unspecified: Secondary | ICD-10-CM

## 2020-06-29 DIAGNOSIS — J8410 Pulmonary fibrosis, unspecified: Secondary | ICD-10-CM | POA: Diagnosis not present

## 2020-06-29 DIAGNOSIS — I700 Atherosclerosis of aorta: Secondary | ICD-10-CM | POA: Diagnosis not present

## 2020-06-30 DIAGNOSIS — L570 Actinic keratosis: Secondary | ICD-10-CM | POA: Diagnosis not present

## 2020-06-30 DIAGNOSIS — X32XXXD Exposure to sunlight, subsequent encounter: Secondary | ICD-10-CM | POA: Diagnosis not present

## 2020-07-01 ENCOUNTER — Telehealth: Payer: Self-pay | Admitting: Cardiovascular Disease

## 2020-07-01 NOTE — Telephone Encounter (Signed)
Patient called and stated that he would like for Dr. Percival Spanish to look over his CT Scan from the pulmonologist to see if everything is ok with his heart

## 2020-07-01 NOTE — Telephone Encounter (Signed)
Called patient, advised that Dr.Kelly was not here today, will return on Monday- he suggested that a PA look over the results, but I did advise they were in clinic and I would do what I could- advised him to contact who ordered the testing to get the results faster as well. Patient verbalized understanding.

## 2020-07-04 ENCOUNTER — Other Ambulatory Visit (HOSPITAL_COMMUNITY)
Admission: RE | Admit: 2020-07-04 | Discharge: 2020-07-04 | Disposition: A | Discharge status: Home / Self Care | Payer: PPO | Source: Ambulatory Visit | Attending: Internal Medicine | Admitting: Internal Medicine

## 2020-07-04 DIAGNOSIS — Z20822 Contact with and (suspected) exposure to covid-19: Secondary | ICD-10-CM | POA: Insufficient documentation

## 2020-07-04 DIAGNOSIS — Z01812 Encounter for preprocedural laboratory examination: Secondary | ICD-10-CM | POA: Diagnosis not present

## 2020-07-04 LAB — SARS CORONAVIRUS 2 (TAT 6-24 HRS): SARS Coronavirus 2: NEGATIVE

## 2020-07-06 ENCOUNTER — Ambulatory Visit (INDEPENDENT_AMBULATORY_CARE_PROVIDER_SITE_OTHER): Payer: PPO | Admitting: Internal Medicine

## 2020-07-06 ENCOUNTER — Other Ambulatory Visit: Payer: Self-pay

## 2020-07-06 ENCOUNTER — Telehealth: Payer: Self-pay | Admitting: Internal Medicine

## 2020-07-06 ENCOUNTER — Encounter: Payer: Self-pay | Admitting: Internal Medicine

## 2020-07-06 VITALS — BP 122/68 | HR 78 | Ht 68.0 in | Wt 196.0 lb

## 2020-07-06 DIAGNOSIS — R0602 Shortness of breath: Secondary | ICD-10-CM

## 2020-07-06 DIAGNOSIS — J849 Interstitial pulmonary disease, unspecified: Secondary | ICD-10-CM | POA: Diagnosis not present

## 2020-07-06 DIAGNOSIS — K521 Toxic gastroenteritis and colitis: Secondary | ICD-10-CM | POA: Diagnosis not present

## 2020-07-06 DIAGNOSIS — R079 Chest pain, unspecified: Secondary | ICD-10-CM | POA: Diagnosis not present

## 2020-07-06 LAB — PULMONARY FUNCTION TEST
DL/VA % pred: 90 %
DL/VA: 3.66 ml/min/mmHg/L
DLCO cor % pred: 79 %
DLCO cor: 19.11 ml/min/mmHg
DLCO unc % pred: 79 %
DLCO unc: 19.11 ml/min/mmHg
FEF 25-75 Post: 2.75 L/sec
FEF 25-75 Pre: 2.56 L/sec
FEF2575-%Change-Post: 7 %
FEF2575-%Pred-Post: 125 %
FEF2575-%Pred-Pre: 116 %
FEV1-%Change-Post: 2 %
FEV1-%Pred-Post: 98 %
FEV1-%Pred-Pre: 96 %
FEV1-Post: 2.89 L
FEV1-Pre: 2.82 L
FEV1FVC-%Change-Post: 3 %
FEV1FVC-%Pred-Pre: 110 %
FEV6-%Change-Post: 0 %
FEV6-%Pred-Post: 91 %
FEV6-%Pred-Pre: 92 %
FEV6-Post: 3.46 L
FEV6-Pre: 3.48 L
FEV6FVC-%Change-Post: 0 %
FEV6FVC-%Pred-Post: 106 %
FEV6FVC-%Pred-Pre: 106 %
FVC-%Change-Post: -1 %
FVC-%Pred-Post: 85 %
FVC-%Pred-Pre: 86 %
FVC-Post: 3.46 L
FVC-Pre: 3.5 L
Post FEV1/FVC ratio: 84 %
Post FEV6/FVC ratio: 100 %
Pre FEV1/FVC ratio: 81 %
Pre FEV6/FVC Ratio: 100 %
RV % pred: 71 %
RV: 1.71 L
TLC % pred: 79 %
TLC: 5.29 L

## 2020-07-06 NOTE — Progress Notes (Signed)
  Synopsis: Referred in November 2017 for evaluation of shortness of breath.  Found to have diffuse parenchymal lung disease of undetermined cause. An open lung biopsy performed on 02/19/2018 showed an early fibrotic process suggestive of UIP.  He only smoked cigarettes for about 3 to 6 months when he was a teenager.  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    OCt 2019   [name] returns to clinic today to discuss the results from his recent open lung biopsy which showed early fibrotic changes suggestive of UIP as well as emphysema.  He tells me today that he is slowly feeling better but he does still have some fatigue and shortness of breath.  He has not been exercising much lately.  He tried to go fishing yesterday and he says that any walking was quite difficult for him.  He says that he is not coughing too much right now.  He is taking prednisone still as directed by her clinic earlier this month and he is wondering what to do with that.  He is still taking a bronchodilator.  He tells me today that he only smokes cigarettes for about 3 to 6 months when he was a teenager.  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   OV March 2020 [name] has returned to clinic today to see me.  He recently was hospitalized for diverticulitis, perforated, requiring emergent surgery. He says that his wound is slowly healing. He resumed CellCept and prednisone per my recommendation. His wife says that his wound is healing fairly well.  His shortness of breath is at baseline.  No recent pneumonia or bronchitis.  He is taking his nintedanib.   [name] GENDER: male DOB: 09/11/1945, MRN: [id number]  Chief Complaint  Patient presents with  . Follow-up    Patient has quit taking the OFEV and has felt a lot better  since then. Patient was taking Stiolto but got thrush and took some Nystatin and it resolved it. Patient needs back surgery in about 3-4 months.      OV 04/16/2019: [name] is a 73-year-old gentleman who presents for follow-up of NSIP.  He has had worse symptoms since interrupting his CellCept and nintedanib for his colectomy reversal.  He had bleeding when he initially restarted these medicines a few weeks postop, which required stopping them again.  He restarted his nintedanib last week and CellCept with Bactrim about 2 weeks prior.  He has not had any additional bleeding.  No abdominal pain.  He feels better than he did before surgery.  He still has mild dyspnea, not worse than his last visit.  He notices it most when he is leaning over.  No other complaints.  His PCP did his 3-month lab work earlier this week, and he will call the office to request they send us results.   OV 03/19/2019: [name] is a 73-year-old gentleman with history of ILD to NSIP and polymyalgia rheumatica on chronic immunosuppression who presents for follow-up.  He underwent colostomy reversal on 01/26/2019, for which his CellCept plus Bactrim and nintedanib were held due to concern for potential surgical complications.  Several weeks following surgery his medications were restarted, and within a day or 2 he developed rectal bleeding.  This continued for several days until he notified us,   and we instructed him to stop.  Within the next day or 2 the bleeding has stopped.  He notices that his breathing has started to get a little bit worse over the last few weeks, and he would like to restart his medications.  He feels like he cannot exert himself due to his mild dyspnea.  He denies abdominal pain, fevers, nausea, vomiting, diarrhea, constipation, or periodic rectal bleeding.  He is following up with Dr. Marlou Starks surgery tomorrow.  He had had a colonoscopy just prior to surgery, but is unsure if he had hemorrhoids.  He follows with  [name] in rheumatology.  His prednisone has been tapered down to 7 mg, which will be his dose for now.  There has been mention of possibly starting Plaquenil in the future for his polymyalgia rheumatica.  OV 01/21/19: [name] is a 75 year old gentleman with a history of fibrosing NSIP diagnosed on open lung biopsy in 2019 who presents for routine follow-up and preoperative evaluation before colostomy reversal next week.  He has a history of polymyalgia rheumatica for which she has been on chronic steroids.  He is followed by [name] from rheumatology.  Currently he is being titrated off his chronic prednisone, currently on 8 mg daily.  For his NSIP he is maintained on CellCept, nintedanib, and Bactrim 3 days a week.  He is doing well, better than last year from breathlessness standpoint.  He went hiking with his wife a few weeks ago, which he reports he would have been unable to do a year ago.  He has nausea and diarrhea associated with nintedanib, but he is able to tolerate these with the use of Zofran and Imodium.  No jaundice, itching, right upper quadrant pain.  In February 2020 he underwent emergency partial colectomy with diverting colostomy for perforated diverticulitis.  He is planning to undergo colostomy reversal next week.  In July he fell, causing an L1 compression fracture.  [name] is planning to do a bone mineral density study, and currently he is not on calcium and vitamin D supplements.  In a few months he is planning on having a small outpatient back surgery to address a bone spur that is causing radicular pain in his left leg.  Today he wants to discuss plans for his pulmonary meds perioperatively.  He has previously had symptomatic renal insufficiency when he tried to come off prednisone on his own too quickly.  During his VATS and colectomy he received 10 mg of dexamethasone in the OR, and he received low-dose Solu-Medrol postoperatively from his colectomy.     OV  07/21/19: [name] is a 75 year old gentleman with a history of ILD- UIP vs NSIP on chronic Ofev, mycophenolate who presents for follow-up.  He required disruption of these medications for several months in late 2020 his colostomy reversal.  When the meds were restarted postoperatively he had persistent bleeding, leading to prolonged time off medications and restarting them sequentially.  He feels that his breathing has gotten worse over time since then, but is unsure if that was all due to being off the meds or if it is continued to decline since then.  His breathing is usually worse during warmer weather.  He has noticed that his activities are more limited than they used to be.  He recently had an outpatient back surgery where he had to have bone fragments removed that were pinching and nerves.  He was developing left lower extremity weakness preoperatively.  Due to ongoing  nerve impingement that is presumed to be due to inflammation, he is currently on a prednisone taper.  Currently on 20 mg daily and will decrease to 15 mg daily tomorrow.  He is on chronic prednisone for polymyalgia rheumatica at 7 mg daily.  He has been on Ofev 100 mg twice daily.  He was initially started on 150 mg twice daily, but required decrease in his dose due to nausea, vomiting, diarrhea.  Over time the symptoms have improved.  He restarted his meds postoperatively in the fall 2020 he had symptoms short-term, but noticed improvement over time.  When he had GI symptoms at the higher dose, he had some improvement with Zofran and Phenergan.  He is currently not having blood in his stools or other issues related to surgery.    He and his wife are planning a cross-country trip with their RV to visit children and grandchildren later this spring where they will be gone for 2 months.  Reviewed rheumatology, Dr. Beekman's clinic note 06/15/2019.  Considering starting Plaquenil to reduce dose of prednisone.   OV 4?!5/21  [name]  is a 74-year-old gentleman with a history of IPF who presents for follow-up.  He is accompanied by his wife today.  He recently had a severe shortness of breath episode that was precipitated by being around a significant amount of sawdust.  He did well with Stiolto, but developed thrush, which has been an issue for him with inhalers in the past.  He is improved with nystatin.  He stopped his Ofev due to severe intractable nausea and vomiting.  He now feels much better and is back to normal.  His shortness of breath is persistent-he has trouble walking around the house.  He has continued on his Bactrim and CellCept plus prednisone for polymyalgia rheumatica.  He wants to know if he can stop his Eliquis to be able to take an NSAID for help with his joint pain.  He has new leg edema bilaterally, but no edema elsewhere.  No chest pain.       OV 10/22/2019  Subjective:  [name], male , DOB: 10/01/1945 , age 74 y.o. , MRN: [id number] , ADDRESS: [address] [place] [address]   PCP Hall, John Z, MD Rheum - Dr Beekman Pulm 0Dr McQuaid -> Dr Paige Clark -> Dr [name]  10/22/2019 -   Chief Complaint  Patient presents with  . Follow-up    Pt being referred to Dr. Ramaswamy by Dr. Clark for ILD.  Pt states he has been doing good since last visit and states that he feels SOB is stable. Denies any complaints of cough or chest discomfort.  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  - in 2019 : considered progressive pre-bx (on PFT/CT per notes)  - in 2019 - clinical CTD ruled out by Dr. Beekman [per record review_  -High-resolution CT March 2021: Indeterminate for UIP without change since 2017  RX  - Mid-endJan   2020 -s tarted cellcept/bactrim + ofev (course complicated early February 2020 by diverticulitis and intermittent side effects with stop start of the nintedanib)  Pathological tissue mild emphysema present on surgical lung biopsy October 2019 [not evident on pulmonary function testing]  - Alpha 1 - MM:  Oct 2019  -On inhaler therapy  -No obstruction seen on PFT or emphysema on CT   History of perforated sigmoid diverticulitis early February 2021 few weeks after starting nintedanib/CellCept  -Status post reversal of colostomy in September 2020   HPI [name] 74 y.o. -presents to the ILD center with the above history.  Is a transfer of care to the ILD center.  Recently his case was discussed in June 2021 with interstitial lung disease multidisciplinary conference.  His pathology though showed fibroblastic foci did have then reformed branching fibrosis.  There was ossification.  There is no honeycombing.  There was heterogeneity and fibroblastic foci suggestive of UIP.  Overall it was felt that he had unclassifiable ILD.  The radiologist felt his CT scan had not progressed between 2018 and 2021.  His pulmonary function test were variable.  Therefore it was recommended that he be seen at the ILD center.  His ILD diagnosis as above.  He tells me though that when he started the nintedanib he started feeling better.  Although at the same time or before that he was also committed to inhaler therapy based on pathological emphysema on his lung biopsy from 2019.  This is also helped.  Therefore he is a concomitant diagnosis COPD/emphysema.  He is currently here to discuss his future care options.  He tells me that he has continued  CellCept and Bactrim for his ILD since early 2020 except for interruptions due to surgery.  He has been off nintedanib but recently has rechallenged himself at 150 mg once daily for the last 1 month and he is tolerating it fine.  His rheumatologist is working down on his prednisone to 5 mg/day [this is for polymyalgia rheumatica].  He tells me he has upcoming spinal surgery to the C-spine and later to the L-spine.  He wants clearance for this.  Noted he is on anticoagulation with Eliquis and has had bleeding episode.  [facility] Comprehensive ILD Questionnaire  Symptoms:    Overall he is reporting progressive dyspnea although the conference it was felt radiology did not change over time.  Is been going on insidious onset for the last 3 years.  Dyspnea present on exertion relieved by rest.    SYMPTOM SCALE - ILD 10/22/2019   O2 use ra  Shortness of Breath 0 -> 5 scale with 5 being worst (score 6 If unable to do)  At rest 1  Simple tasks - showers, clothes change, eating, shaving 3  Household (dishes, doing bed, laundry) 3  Shopping 3  Walking level at own pace 4  Walking up Stairs 5  Total (30-36) Dyspnea Score 19  How bad is your cough? 0  How bad is your fatigue yes  How bad is nausea yes  How bad is vomiting?  n  How bad is diarrhea? no  How bad is anxiety? x  How bad is depression x        Past Medical History : As above   ROS: Positive for fatigue and diffuse arthralgia.  No dysphagia.  No dry eyes.  No Raynaud's no weight loss.  No vomiting.  No rash   FAMILY HISTORY of LUNG DISEASE:  -Father had asthma.    No COPD no sarcoid no cystic fibrosis no hypersensitive pneumonitis no autoimmune disease.  EXPOSURE HISTORY: Briefly smoked cigarettes for 3 years.  Otherwise never smoked  Cigarettes.  He did smoke marijuana very little in 1972 and never use cocaine.  No intravenous drug use     HOME and HOBBY DETAILS : [place].  Age of the  home is 65 years.  He has lived in the home for 30 years.  No dampness no mildew no mold no humidifier use no CPAP use no nebulizer use.  No steam iron use.  No Jacuzzi use.  There is a misting Fountain in the house because his wife likes it.  But there is no mold or mildew.  No pet gerbils.  No feather pillows.  No mold in the AC duct.  No music habits no guarding habits.  No flood of water damage.  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  In the past he is done woodwork, deep construction work, carpentry.  He has been exposed to asbestos woodwork and furniture work.  During furniture work he got exposed to meth saline chloride.  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 Range 05/03/2016 16:34 10/26/2016 09:28 01/10/2018 11:01 07/15/2019 08:59  FVC-Pre Latest Units: L 3.55 4.21 3.52 3.58  FVC-%Pred-Pre Latest Units: % 80 96 82 88   Results for Ottley, Davonta SILER "SI" (MRN [id number]) as of 10/22/2019 16:07  Ref. Range 05/03/2016 16:34 10/26/2016 09:28 01/10/2018 11:01 07/15/2019 08:59  TLC Latest Units: L 5.85 6.79 7.25 5.86  TLC % pred Latest Units: % 84 98 106 88   Results for Ruppe, Faiz SILER "SI" (MRN [id number]) as of 10/22/2019 16:07  Ref. Range 05/03/2016 16:34 10/26/2016 09:28 01/10/2018 11:01 07/15/2019 08:59  DLCO unc  Latest Units: ml/min/mmHg 23.92 26.71 21.15 19.51  DLCO unc % pred Latest Units: % 75 84 68 80      ROS - per HPI  Results for Dunker, Demari SILER "SI" (MRN [id number]) as of 10/22/2019 16:07  Ref. Range 05/29/2016 10:05 10/10/2016 13:54 01/14/2018 09:50 01/14/2018 09:50 03/12/2018 11:24 6/10  Aspergillus flavus Unknown REPORT       Aspergillus Fumigatus Latest Ref Range: NEGATIVE     NEGATIVE    A fumigatus #1 Unknown REPORT       Aspergillus niger Unknown REPORT       [place] Serum Latest Ref Range: NEGATIVE  REPORT   NEGATIVE    Apullulans Unknown REPORT       A-1 Antitrypsin, Ser Latest Ref Range: 83 - 199 mg/dL     125   Anti Nuclear Antibody (ANA) Latest Ref Range: NEGATIVE  NEG   NEGATIVE      CENTROMERE AB SCREEN Latest Ref Range: <1.0 NEG AI <1.0 NEG  <1.0 NEG     Cyclic Citrullin Peptide Ab Latest Units: UNITS <16   <16  < 16  RA Latex Turbid. Latest Ref Range: <14 IU/mL 24 (H)   38 (H)  32  B burgdorferi Ab IgG+IgM Latest Ref Range: <0.90 Index  <0.90      SSA (Ro) (ENA) Antibody, IgG Latest Ref Range: <1.0 NEG AI <1.0 NEG   <1.0 NEG    SSB (La) (ENA) Antibody, IgG Latest Ref Range: <1.0 NEG AI <1.0 NEG   <1.0 NEG    Scleroderma (Scl-70) (ENA) Antibody, IgG Latest Ref Range: <1.0 NEG AI <1.0 NEG  <1.0 NEG <1.0 NEG      IMPRESSION: COMPARISON:  CT chest, 12/06/2017, 06/13/2016, 04/02/2016  FINDINGS: Cardiovascular: Aortic atherosclerosis. Normal heart size. No pericardial effusion.  Mediastinum/Nodes: No enlarged mediastinal, hilar, or axillary lymph nodes. Thyroid gland, trachea, and esophagus demonstrate no significant findings.  Lungs/Pleura: Wedge resections of the right lung. Redemonstrated mild pulmonary fibrosis with a slight apical to basal gradient featuring tubular bronchiectasis in the lower lobes, peripheral irregular interstitial pulmonary opacity, and no evidence of bronchiolectasis or honeycombing. Fibrotic findings are not significantly changed  compared to prior examinations. No significant air trapping on expiratory phase examination. No pleural effusion or pneumothorax.  1. No significant change in mild pulmonary fibrosis with a slight apical to basal gradient featuring tubular bronchiectasis in the lower lobes, peripheral irregular interstitial pulmonary opacity, and no evidence of bronchiolectasis or honeycombing. No significant air trapping on expiratory phase examination. Findings remain consistent with "indeterminate for UIP" pattern fibrosis by ATS pulmonary fibrosis criteria. Findings are indeterminate for UIP per consensus guidelines: Diagnosis of Idiopathic Pulmonary Fibrosis: An Official ATS/ERS/JRS/ALAT Clinical Practice Guideline. Am J Respir Crit Care Med Vol 198, Iss 5, [id number], Jan 12 2017.  2.  Interval wedge biopsies of the right lung.  3.  Aortic Atherosclerosis (ICD10-I70.0).   Electronically Signed   By: Alex  Bibbey M.D.   On: 07/30/2019 16:33    OV 12/30/2019  Subjective:  [name], male , DOB: 09/16/1945 , age 74 y.o. , MRN: [id number] , ADDRESS: [address] [place] [address]   12/30/2019 -   Chief Complaint  Patient presents with  . Follow-up    reports feeling "about the same" since last visit. Last albuterol use was this past weekend   Follow-up unclassifiable interstitial lung disease with trace positive rheumatoid factor  HPI [name] 74 y.o. -presents for follow-up. Since his last visit we recheck his autoimmune panel. His rheumatoid factor is only trace positive. He had pulmonary function test today and is documented below the stable. His spring 2021 CT chest is stable. Overall stability for few years. He again tells me that he is much better than he was 18 months ago in terms of shortness of breath. Symptom scores document that. At this point in time he is off CellCept. He thinks his improvement is related to starting nintedanib. His  wife is here with him and I am meeting her for the first time. She did acknowledge that this potential that he is feeling better because back then when he had the PVCs of ILD diagnosed he was not in a good frame of mind. Because he was dealing with back and neck issues. This is because his pre and post nintedanib PFT shows stability. Since his last visit he is off CellCept. He has had   a C-spine surgery. His back surgery has been postponed. However he is dealing with significant diarrhea. The diarrhea is despite stopping CellCept. This is because of nintedanib. He currently has dropped his nintedanib to 100 mg once daily but despite that is still having diarrhea. He does take some coffee in the morning with sugar but despite that has diarrhea. He prefers that he stop the nintedanib but in the past he acknowledges that it helped him.     SYMPTOM SCALE - ILD 10/22/2019  12/30/2019   O2 use ra ra  Shortness of Breath 0 -> 5 scale with 5 being worst (score 6 If unable to do)   At rest 1 1  Simple tasks - showers, clothes change, eating, shaving 3 1  Household (dishes, doing bed, laundry) 3 2  Shopping 3 1  Walking level at own pace 4 2  Walking up Stairs 5 4  Total (30-36) Dyspnea Score 19 11  How bad is your cough? 0 0  How bad is your fatigue yes 3  How bad is nausea yes 3 ofev  How bad is vomiting?  n 0  How bad is diarrhea? no 3 ofev  How bad is anxiety? x 1  How bad is depression x 1     Simple office walk 185 feet x  3 laps goal with forehead probe 10/22/2019   O2 used ra  Number laps completed 3  Comments about pace avg  Resting Pulse Ox/HR 98% and 90/min  Final Pulse Ox/HR 96% and 107/min  Desaturated </= 88% no  Desaturated <= 3% points no  Got Tachycardic >/= 90/min yes  Symptoms at end of test Mild dyspnea  Miscellaneous comments x    PFT Results Latest Ref Rng & Units 12/30/2019 07/15/2019 01/10/2018 10/26/2016 05/03/2016  FVC-Pre L 3.56 3.58 3.52 4.21 3.55  FVC-Predicted  Pre % 88 88 82 96 80  FVC-Post L - 3.60 3.53 4.08 3.65  FVC-Predicted Post % - 88 83 93 83  Pre FEV1/FVC % % 82 76 81 78 81  Post FEV1/FCV % % - 79 82 81 83  FEV1-Pre L 2.93 2.72 2.85 3.30 2.88  FEV1-Predicted Pre % 99 91 91 102 88  FEV1-Post L - 2.85 2.88 3.30 3.02  DLCO uncorrected ml/min/mmHg 20.72 19.51 21.15 26.71 23.92  DLCO UNC% % 86 80 68 84 75  DLCO corrected ml/min/mmHg 20.55 19.51 20.44 25.62 23.99  DLCO COR %Predicted % 85 80 65 80 75  DLVA Predicted % 92 90 81 87 91  TLC L - 5.86 7.25 6.79 5.85  TLC % Predicted % - 88 106 98 84  RV % Predicted % - 94 155 102 94     ROS - per HPI      OV 07/06/2020  Subjective:  [name], male , DOB: 05/02/1946 , age 74 y.o. , MRN: [id number] , ADDRESS: [address] [place] [address] PCP Hall, John Z, MD Patient Care Team: Hall, John Z, MD as PCP - General (Internal Medicine) Kelly, Thomas A, MD as PCP - Cardiology (Cardiology)  This Provider for this visit: Treatment Team:  Attending Provider: Ramaswamy, Murali, MD    07/06/2020 -   Chief Complaint  Patient presents with  . Follow-up    PFT performed today.  Pt states he feels like he might have declined some since last visit. Pt states his breathing is some worse. Denies any complaints of cough.   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  - in 2019 : considered progressive pre-bx (on PFT/CT per notes)  - in 2019 -  clinical CTD ruled out by Dr. Beekman [per record review_  -High-resolution CT March 2021: Indeterminate for UIP without change since 2017  RX  - Mid-endJan 2020 -s tarted cellcept/bactrim + ofev (course complicated early February 2020 by diverticulitis and intermittent side effects with stop start of the nintedanib)  - stoppe cellcept June 2021 due to diarrhea/immune suppressino  - off ofev aug 2021 due to severe diarrhea  Pathological tissue mild emphysema present on surgical lung biopsy October 2019 [not evident on pulmonary function testing]  - Alpha 1 - MM:  Oct 2019  -On inhaler therapy  -No obstruction seen on PFT or emphysema on CT   History of perforated sigmoid diverticulitis early February 2021 few weeks after starting nintedanib/CellCept  -Status post reversal of colostomy in September 2020   HPI [name] 74 y.o. -returns for follow-up for his ILD.  He presents with his wife.  He is on supportive care.  He quit taking his CellCept and nintedanib in the summer 2021 following diarrhea in the setting of GI surgery and colostomy reversal.  He does not want to take these drugs anymore.  However he tells me that he is feeling more short of breath in the last 4 months.  Present on exertion relieved by rest.  His symptom score reflect worsening shortness of breath.  He continues to have some amount of diarrhea because of reversal of his colostomy.  He had a high-resolution CT chest that shows stability in ILD per the radiologist since 2017.  His walking desaturation test is stable.  However his pulmonary function test shows confusing signal compared to 6 months or 1 year ago.  Is stable versus normal variation versus slight decline.  I personally visualized the images of the pulmonary function test and the grph with him.  He is open to getting his pulmonary pathology slides sent for third opinion at an outside institution.  We discussed antifibrotic's in case his fibrosis is  getting worse.  He is definitely not interested in the other option of pirfenidone after hearing side effects of nausea, vomiting, weight loss, fatigue and possible diarrhea.  He is interested in drugs that may not have GI side effects.  These are available in clinical trials.  For which she will need an IPF diagnosis.  Therefore is interested in getting another opinion on the slides to see if you would qualify for trials in the future.  His wife does indicate that he did have an episode of chest pain many went outdoors a few months ago and was climbing a hill.  In fact the chest pain was so bad it was central that he had to come back.  They have not reported this to the cardiologist.  He has cardiology follow-up coming up with Dr. Kelly in May 2022.  He had normal stress test or low risk in 2017.  His   echo was normal in May 2021.     SYMPTOM SCALE - ILD 10/22/2019  12/30/2019  07/06/2020   O2 use ra ra ra  Shortness of Breath 0 -> 5 scale with 5 being worst (score 6 If unable to do)    At rest 1 1 0  Simple tasks - showers, clothes change, eating, shaving _0 Household (dishes, doing bed, laundry) _1 Shopping _2 Walking level at own pace _3 Walking up Stairs _4 Total (30-36) Dyspnea Score _5 How bad is your cough? 0 0 0  How bad is your fatigue yes 3 2  How bad is nausea yes 3 ofev 1  How bad is vomiting?  n 0 0  How bad is diarrhea? no 3 ofev 1 - baseline from GI surgery  How bad is anxiety? x 1 0  How bad is depression x 1 0     Simple office walk 185 feet x  3 laps goal with forehead probe 10/22/2019  07/06/2020   O2 used ra ra  Number laps completed 3 3  Comments about pace avg avg  Resting Pulse Ox/HR 98% and 90/min 98% and HR 78  Final Pulse Ox/HR 96% and 107/min 97% and HR 95  Desaturated </= 88% no   Desaturated <= 3% points no   Got Tachycardic >/= 90/min yes   Symptoms at end of test Mild dyspnea Mild dy spnea  Miscellaneous comments x       PFT  PFT Results Latest Ref Rng & Units 07/06/2020 12/30/2019 07/15/2019 01/10/2018 10/26/2016 05/03/2016  FVC-Pre L 3.50 3.56 3.58 3.52 4.21 3.55  FVC-Predicted Pre % 86 88 88 82 96 80  FVC-Post L 3.46 - 3.60 3.53 4.08 3.65  FVC-Predicted Post % 85 - 88 83 93 83  Pre FEV1/FVC % % 81 82 76 81 78 81  Post FEV1/FCV % % 84 - 79 82 81 83  FEV1-Pre L 2.82 2.93 2.72 2.85 3.30 2.88  FEV1-Predicted Pre % 96 99 91 91 102 88  FEV1-Post L 2.89 - 2.85 2.88 3.30 3.02  DLCO uncorrected ml/min/mmHg 19.11 20.72 19.51 21.15 26.71 23.92  DLCO UNC% % 79 86 80 68 84 75  DLCO corrected ml/min/mmHg 19.11 20.55 19.51 20.44 25.62 23.99  DLCO COR %Predicted % 79 85 80 65 80 75  DLVA Predicted % 90 92 90 81 87 91  TLC L 5.29 - 5.86 7.25 6.79 5.85  TLC % Predicted % 79 - 88 106 98 84  RV % Predicted % 71 - 94 155 102 94    IMPRESSION: HRCT  Feb 2022  COMPARISON:  07/30/2019, 04/02/2016  FINDINGS: Cardiovascular: Aortic atherosclerosis. Normal heart size. No pericardial effusion.  Mediastinum/Nodes: No enlarged mediastinal, hilar, or axillary lymph nodes. Thyroid gland, trachea, and esophagus demonstrate no significant findings.  Lungs/Pleura: Evidence of prior right lung wedge resections. No significant interval change in pattern of mild pulmonary fibrosis featuring irregular peripheral interstitial opacity and mild, tubular bronchiectasis at the lung basis without evidence of significant subpleural bronchiolectasis or honeycombing. No significant air trapping on expiratory phase imaging. No pleural effusion or pneumothorax.   1. No significant interval change in pattern of mild pulmonary fibrosis featuring irregular peripheral interstitial opacity and mild, tubular bronchiectasis at the lung basis without evidence of significant subpleural bronchiolectasis or honeycombing. Findings remain consistent with an "indeterminate for UIP" pattern of fibrosis by pulmonary fibrosis criteria.  Findings are indeterminate for UIP per consensus guidelines: Diagnosis of Idiopathic Pulmonary Fibrosis: An Official ATS/ERS/JRS/ALAT Clinical Practice Guideline. [place], Iss 5, [phone], Jan 12 2017. 2. Evidence of prior right lung wedge resections.  Aortic Atherosclerosis (ICD10-I70.0).   Electronically Signed   By: Eddie Candle M.D.   On: 06/30/2020 09:12     has a past medical history of Allergy to alpha-gal, Arthritis, Chest pain, Complication of anesthesia, Digestive problems, Dyspnea, Dysrhythmia, GERD (gastroesophageal reflux disease), H/O viral myocarditis, Head injury, closed, with concussion, Hyperlipidemia, Mild mitral valve prolapse, PAF (paroxysmal atrial fibrillation) ([place]), Polymyalgia rheumatica ([place]), Pre-diabetes, Pulmonary fibrosis ([place]), and Sleep apnea.   reports that he has never smoked. He has never used smokeless tobacco.  Past Surgical History:  Procedure Laterality Date  . ANTERIOR CERVICAL DECOMP/DISCECTOMY FUSION N/A 11/10/2019   Procedure: Cervical Three-Four Anterior cervical decompression/discectomy/fusion;  Surgeon: [name], MD;  Location: [place];  Service: Neurosurgery;  Laterality: N/A;  Cervical Three-Four Anterior cervical decompression/discectomy/fusion  . apendectomy  1965  . APPENDECTOMY    . BACK SURGERY    . bone spur Bilateral 1999   feet  . CARDIAC CATHETERIZATION  2001  . CHOLECYSTECTOMY  2004  . COLON RESECTION N/A 06/15/2018   Procedure: SIGMOID COLON RESECTION;  Surgeon: [name], MD;  Location: [facility];  Service: General;  Laterality: N/A;  . COLONOSCOPY    . COLOSTOMY N/A 06/15/2018   Procedure: COLOSTOMY;  Surgeon: [name], MD;  Location: [facility];  Service: General;  Laterality: N/A;  . COLOSTOMY TAKEDOWN N/A 01/26/2019   Procedure: LAPAROSCOPIC ASSISTED COLOSTOMY TAKEDOWN;  Surgeon: [name], MD;  Location: [place] [facility];  Service: General;  Laterality: N/A;  . EYE SURGERY Bilateral     cataract removal  . LAPAROSCOPIC LYSIS OF ADHESIONS N/A 01/26/2019   Procedure: LAPAROSCOPIC LYSIS OF ADHESIONS;  Surgeon: [name], MD;  Location: [place];  Service: General;  Laterality: N/A;  . LAPAROTOMY N/A 06/15/2018   Procedure: EXPLORATORY LAPAROTOMY;  Surgeon: [name], MD;  Location: [facility];  Service: General;  Laterality: N/A;  . LUMBAR LAMINECTOMY/ DECOMPRESSION WITH MET-RX Right 05/05/2013   Procedure: Right Lumbar three-four Extraforaminal Microdiskectomy with Metrex;  Surgeon: [name], MD;  Location: [facility];  Service: Neurosurgery;  Laterality: Right;  Right Lumbar three-four Extraforaminal Microdiskectomy with Metrex  . LUNG BIOPSY Right 02/19/2018   Procedure: LUNG BIOPSY;  Surgeon: [name], MD;  Location: [place];  Service: Thoracic;  Laterality: Right;  . SHOULDER OPEN ROTATOR CUFF REPAIR Bilateral 2001  . TONSILLECTOMY    . VIDEO ASSISTED THORACOSCOPY Right 02/19/2018   Procedure: VIDEO ASSISTED THORACOSCOPY;  Surgeon: [name], MD;  Location: [place];  Service: Thoracic;  Laterality: Right;    Allergies  Allergen Reactions  . Xylocaine [Lidocaine] Anaphylaxis  . Codeine Nausea And Vomiting  . Statins Other (See Comments)    Muscle soreness and an aching feeling all over Myalgias  . Percocet [Oxycodone-Acetaminophen] Itching    Immunization History  Administered Date(s) Administered  . Influenza Split 03/20/2016  . Influenza, High Dose Seasonal PF 01/29/2018, 02/25/2019, 03/20/2019, 12/13/2019  . Influenza,inj,Quad PF,6+ Mos 03/12/2017  . Moderna Sars-Covid-2 Vaccination 06/04/2019, 07/10/2019, 03/18/2020  . Pneumococcal Conjugate-13 01/10/2018    Family History  Problem Relation Age of Onset  . Rheum arthritis Mother   . Thyroid cancer Mother   . Heart disease Father   . Asthma Father   .  Thyroid cancer Sister   . Melanoma Brother      Current Outpatient Medications:  .  acetaminophen (TYLENOL) 650 MG CR tablet,  Take 1,300 mg by mouth every 8 (eight) hours as needed for pain., Disp: , Rfl:  .  budesonide-formoterol (SYMBICORT) 80-4.5 MCG/ACT inhaler, Inhale 2 puffs into the lungs 2 (two) times daily., Disp: , Rfl:  .  cetirizine (ZYRTEC) 10 MG tablet, Take 10 mg by mouth daily as needed for allergies. , Disp: , Rfl:  .  Cholecalciferol (DIALYVITE VITAMIN D 5000) 125 MCG (5000 UT) capsule, Take 5,000 Units by mouth daily., Disp: , Rfl:  .  cholecalciferol (VITAMIN D3) 25 MCG (1000 UNIT) tablet, Take 1,000 Units by mouth every evening., Disp: , Rfl:  .  diclofenac Sodium (VOLTAREN) 1 % GEL, Apply 1 application topically 4 (four) times daily as needed (pain)., Disp: , Rfl:  .  diltiazem (CARDIZEM CD) 240 MG 24 hr capsule, Take 1 capsule (240 mg total) by mouth daily., Disp: 90 capsule, Rfl: 3 .  diphenoxylate-atropine (LOMOTIL) 2.5-0.025 MG tablet, Take 1 tablet by mouth 4 (four) times daily as needed for diarrhea or loose stools., Disp: , Rfl:  .  ELIQUIS 5 MG TABS tablet, TAKE ONE TABLET (5MG TOTAL) BY MOUTH TWODAILY (Patient taking differently: Take 5 mg by mouth 2 (two) times daily.), Disp: 180 tablet, Rfl: 1 .  ezetimibe (ZETIA) 10 MG tablet, TAKE ONE (1) TABLET BY MOUTH EVERY DAY, Disp: 90 tablet, Rfl: 3 .  fluticasone (FLONASE) 50 MCG/ACT nasal spray, Place 2 sprays into both nostrils daily as needed for allergies or rhinitis., Disp: , Rfl:  .  gabapentin (NEURONTIN) 300 MG capsule, Take 300 mg by mouth 3 (three) times daily. , Disp: , Rfl:  .  metoprolol tartrate (LOPRESSOR) 25 MG tablet, TAKE ONE TABLET BY MOUTH TWICE A DAY (Patient taking differently: Take 25 mg by mouth 2 (two) times daily.), Disp: 180 tablet, Rfl: 3 .  nystatin (MYCOSTATIN) 100000 UNIT/ML suspension, Take 5 mLs (500,000 Units total) by mouth every other day. (Patient taking differently: Take 5 mLs by mouth daily as needed (thrush).), Disp: 437 mL, Rfl: 11 .  ondansetron (ZOFRAN) 4 MG tablet, Take 4 mg by mouth every 8 (eight) hours as  needed for nausea or vomiting. , Disp: , Rfl:  .  predniSONE (DELTASONE) 5 MG tablet, Take 5 mg by mouth daily. , Disp: , Rfl:  .  rosuvastatin (CRESTOR) 10 MG tablet, Take 0.5 tablets (5 mg total) by mouth once a week., Disp: 20 tablet, Rfl: 0 .  tamsulosin (FLOMAX) 0.4 MG CAPS capsule, Take 0.4 mg by mouth 2 (two) times daily. , Disp: , Rfl:       Objective:   Vitals:   07/06/20 1005  BP: 122/68  Pulse: 78  SpO2: 98%  Weight: 196 lb (88.9 kg)  Height: 5' 8" (1.727 m)    Estimated body mass index is 29.8 kg/m as calculated from the following:   Height as of this encounter: 5' 8" (1.727 m).   Weight as of this encounter: 196 lb (88.9 kg).  @WEIGHTCHANGE@  Filed Weights   07/06/20 1005  Weight: 196 lb (88.9 kg)     Physical Examl General: No distress. Looks well Neuro: Alert and Oriented x 3. GCS 15. Speech normal Psych: Pleasant Resp:  Barrel Chest - no.  Wheeze - no, Crackles - some at base, No overt respiratory distress CVS: Normal heart sounds. Murmurs - no Ext: Stigmata of Connective Tissue   Disease - no HEENT: Normal upper airway. PEERL +. No post nasal drip        Assessment:       ICD-10-CM   1. ILD (interstitial lung disease) (HCC)  J84.9   2. Diarrhea due to drug  K52.1   3. Shortness of breath  R06.02   4. Chest pain, exertional  R07.9        Plan:     Patient Instructions     ICD-10-CM   1. ILD (interstitial lung disease) (HCC)  J84.9   2. Diarrhea due to drug  K52.1   3. Shortness of breath  R06.02   4. Chest pain, exertional  R07.9    According to the radiologist the pulmonary fibrosis is stable between 2017 through February 2022 on CT scan of the chest  In the last 6 months you are reporting more shortness of breath .  However the CT scan of the chest is stable and a walking desaturation test is stable with pulmonary function test showing a mixed signal  Noted that you had one episode of exertional chest pain and also reporting few  pounds of weight gain  Plan -We discussed about starting the other antifibrotic pirfenidone but given your history of GI side effects with nintedanib I respect your desire to refuse but continue with expectant follow-up  -We will send a message to Dr. Thomas Kelly to evaluate you for angina  -Once Dr. Kelly clears you I think you should join pulmonary rehabilitation  -I have sent a note to our pathologist to get another opinion from another institution on your lung pathology to see if you have undifferentiated connective tissue disease or UIP/IPF.  If you have UIP/IPF you might qualify for clinical trials with drugs that do not have GI side effects  Follow-up -Do spirometry and DLCO in 3 months -Return to see Dr. Ramaswamy in 3 months or sooner if needed - 30 min visit  ( Level 05 visit: Estb 40-54 min  in  visit type: on-site physical face to visit  in total care time and counseling or/and coordination of care by this undersigned MD - Dr Murali [name]. This includes one or more of the following on this same day 07/06/2020: pre-charting, chart review, note writing, documentation discussion of test results, diagnostic or treatment recommendations, prognosis, risks and benefits of management options, instructions, education, compliance or risk-factor reduction. It excludes time spent by the CMA or office staff in the care of the patient. Actual time 50 min)    SIGNATURE    Dr. Murali [name], M.D., F.C.C.P,  Pulmonary and Critical Care Medicine Staff Physician, [facility] Director - Interstitial Lung Disease  Program  [facility] [place], [place], [address]  Pager: [phone], If no answer or between  15:00h - 7:00h: call [phone] Telephone: [phone]  10:52 AM 07/06/2020  

## 2020-07-06 NOTE — Progress Notes (Signed)
PFT done today. 

## 2020-07-06 NOTE — Telephone Encounter (Signed)
Hi [name]  Wondering if you could see him sooner?.  He is having progressive shortness of breath but I am not getting clear signal that his fibrosis is worse.  He had one episode of chest pain with exertion.  Last stress test was in 2017 and normal.  Last echocardiogram May 2021 was normal  Thanks MR

## 2020-07-06 NOTE — Patient Instructions (Addendum)
ICD-10-CM   1. ILD (interstitial lung disease) ([place])  J84.9   2. Diarrhea due to drug  K52.1   3. Shortness of breath  R06.02   4. Chest pain, exertional  R07.9    According to the radiologist the pulmonary fibrosis is stable between 2017 through February 2022 on CT scan of the chest  In the last 6 months you are reporting more shortness of breath .  However the CT scan of the chest is stable and a walking desaturation test is stable with pulmonary function test showing a mixed signal  Noted that you had one episode of exertional chest pain and also reporting few pounds of weight gain  Plan -We discussed about starting the other antifibrotic pirfenidone but given your history of GI side effects with nintedanib I respect your desire to refuse but continue with expectant follow-up  -We will send a message to Dr. Shelva Majestic to evaluate you for angina  -Once Dr. Claiborne Billings clears you I think you should join pulmonary rehabilitation  -I have sent a note to our pathologist to get another opinion from another institution on your lung pathology to see if you have undifferentiated connective tissue disease or UIP/IPF.  If you have UIP/IPF you might qualify for clinical trials with drugs that do not have GI side effects  Follow-up -Do spirometry and DLCO in 3 months -Return to see Dr. Chase Caller in 3 months or sooner if needed - 30 min visit

## 2020-07-11 NOTE — Telephone Encounter (Signed)
I saw him in early January; will try to work in over the next several weeks.

## 2020-07-12 NOTE — Telephone Encounter (Signed)
Spoke with pt and scheduled appointment for 3/18.

## 2020-07-12 NOTE — Telephone Encounter (Signed)
Please see updated encounter  

## 2020-07-29 ENCOUNTER — Other Ambulatory Visit: Payer: Self-pay

## 2020-07-29 ENCOUNTER — Ambulatory Visit: Payer: PPO | Admitting: Cardiovascular Disease

## 2020-07-29 ENCOUNTER — Encounter: Payer: Self-pay | Admitting: Cardiovascular Disease

## 2020-07-29 VITALS — BP 130/78 | HR 72 | Ht 68.0 in | Wt 192.0 lb

## 2020-07-29 DIAGNOSIS — Z8679 Personal history of other diseases of the circulatory system: Secondary | ICD-10-CM | POA: Diagnosis not present

## 2020-07-29 DIAGNOSIS — J849 Interstitial pulmonary disease, unspecified: Secondary | ICD-10-CM | POA: Diagnosis not present

## 2020-07-29 DIAGNOSIS — Z7901 Long term (current) use of anticoagulants: Secondary | ICD-10-CM | POA: Diagnosis not present

## 2020-07-29 DIAGNOSIS — I700 Atherosclerosis of aorta: Secondary | ICD-10-CM

## 2020-07-29 DIAGNOSIS — I10 Essential (primary) hypertension: Secondary | ICD-10-CM | POA: Diagnosis not present

## 2020-07-29 DIAGNOSIS — I480 Paroxysmal atrial fibrillation: Secondary | ICD-10-CM

## 2020-07-29 DIAGNOSIS — Z79899 Other long term (current) drug therapy: Secondary | ICD-10-CM | POA: Diagnosis not present

## 2020-07-29 MED ORDER — SPIRONOLACTONE 25 MG PO TABS: 12.5000 mg | ORAL_TABLET | Freq: Every day | ORAL | 3 refills | Status: DC

## 2020-07-29 NOTE — Progress Notes (Signed)
[name], male   DOB: 01-24-1946, 75 y.o.   MRN: [id number]    Primary M.D.: Dr. Wende Neighbors  HPI: [name] is a 75 y.o. male who presents for a 7 month cardiology follow-up evaluation.   [name] has remote history of viral myocarditis in 1989 at which time his ejection fraction was 25%. He subsequently normalized LV function. He has a history of mild mitral valve prolapse. Cardiac catheterization in 2001 showed mild mid systolic LAD bridging. He has been noted to have mild T-wave changes on his ECG. Additional problems include hypertension as well as hyperlipidemia. He was unable to tolerate Lipitor. He initially tolerated Crestor but then developed significant myalgias.  He has been able to tolerate Zetia 10 mg daily.   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  Apparently, this has stabilized and is tired.  Ears have almost normalized.  He has been starting to resume very small amounts of red meat.    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  He denies any episodes of chest pain, or change in exercise tolerance.  On 02/01/2015.  He underwent lumbar surgery by [name] involving L2-L3, L3-L4, and L4-L5.  He tolerated surgery without cardiovascular compromise.  When I  saw him in January 2017 he was on amlodipine 2.5 mg and atenolol 25 g twice a day, which has been helpful for his blood pressure, palpitations, as well as documented systolic LAD muscle bridging.    At a subsequent office visit in November 2017 he underwent an echo Doppler study which was done on 03/22/2016 and this showed normal systolic function with an EF of 60-65% with grade 1 diastolic dysfunction.  His aorta was upper normal to mildly dilated at 38 mm.  There was no significant valvular pathology.  There was no TR Doppler jets an estimated PA systolic pressure was not able to be obtained.  Due to his  shortness of breath.  He also underwent a CT of his chest.  He had dependent linear and platelike atelectasis in lower lungs bilaterally.  The patient has a remote exposure to asbestosis in his 75s, but his CT scan did not reveal any evidence for pleural based calcification.  There were diffuse atelectatic changes in the lungs bilaterally.  He also underwent a nuclear perfusion study which showed normal perfusion and function without scar or ischemia.  I had sent off laboratory was notable for a normal BMP 53.  Troponins were negative.  He had been prescribed levaquin by his primary physician as empiric therapy for a mild fever.  His fever resolved but he continues to experience shortness of breath.  His white blood count 13 days ago was 11.2.  He was macrocytic with an MCV of 102 with a normal hemoglobin at 16.2, hematocrit of 46.0.  B12 and folate levels were normal.  An erythrocyte sedimentation rate was increased at 61 and a high-sensitivity C-reactive protein was significantly increased at 139.    I referred him to [name] for pulmonary evaluation for possible interstitial lung disease and also [name] for rheumatologic follow-up.  He was given increased steroids.  In November 2017.  A high resolution CT scan of the chest showed patchy groundglass bilaterally andpatchy lower lobe atelectasis.  A barium swallow did not show evidence for esophageal disorder.  Antineutrophil cytoplasmic antibody testing was negative.  A follow-up high-resolution CT  was done on June 12, 2016, which showed mild basilar predominant subpleural reticulation and groundglass suggesting nonspecific interstitial pneumonitis, however, will interstitial pneumonitis could not be excluded.  He was also noted to have aortic atherosclerosis and a punctate stone in his left kidney.  When seen in 2018 he felt improved.  Subsequent laboratory had shown a sedimentation rate which improved from 61 to  1 and CRP which had improved from  139 to 6.8.  His lipids were elevated with a total cholesterol 222, triglycerides 359, HDL 67, VLDL 72, and LDL 83.  He developed myalgias with Crestor and Lipitor.  He denies chest pain. He is concerned about the cost of soe ofhis medicatios, partiyby.  He continues to be on pradaxa for anticoagulation and long-acting Cardizem.  He is unaware of any recent arrhythmia.  He had follow-up blood work on 01/15/2017 by Wende Neighbors in [place].  Hemoglobin 17.5, hematocrit 48.7.  Lipid studies were improved with a total cholesterol 220, triglycerides 175, HDL 72, LDL 113.  Hemoglobin A1c was 5.7.  He is on CPAP therapy.  He nasal pillows, small size, the D[employer] [facility].  He has undergone more extensive pulmonary evaluation for his diffuse parenchymal lung disease of undetermined cause.  The February 19, 2018 he underwent an open lung biopsy by Dr. Roxan Hockey and there was some suggestion of possible early fibrotic process suggestive of UIP.  Subsequently, his biopsy was evaluated at [facility] by Dr. Reggie Pile.  He was felt to have chronic fibrosing interstitial pneumonia with an unclassifiable pattern.  They did query the possibility of association with an underlying systemic inflammatory or connective tissue disorder.  They did not believe he had UIP. He has also undergone follow-up rheumatologic evaluation.  He was started on a trial of Ofev.  When I  saw him in December 2019 he denied any chest pain or palpitations, PND orthopnea,  presyncope or syncope. He was not short of breath at rest but does get short of breath with mild activity. He was on chronic steroids and inhalers and was on Cellcept and Ofev, followed by Dr Lake Bells.  He was admitted to the hospital on 06/15/2018 with acute diverticulitis requiring exploratory laparotomy and colostomy.The pt was taken off Cellcept and Ofev on admissionPostoperatively his QTC was noted to be prolonged-his QTC on 06/24/2018 was 624. Antibiotics were  adjusted.He saw Kerin Ransom, [facility]  for further evaluation on 06/25/2018. and f/u EKG. His QTC  in the office was 447. He is slowly improving. No palpitations or tachycardia, no unusual dyspnea. He has noted his B/P has been running YYT-03'T systolic.  I evaluated  him in May 2020 with a telemedicine visit.  At that time, he told me that he will be in need for reversal of his colostomy with colostomy takedown and sometime this year will also require back surgery by Dr. Ellene Route.  His lung disease has improved with reinstitution of CellCept.  He denied any chest pain PND orthopnea.  He denies any palpitations.  He was seen 1 month later for follow-up evaluation and his breathing was significantly improved.  He was able to walk for significant duration recently went and noticed significant improvement from previously.  He is scheduled to see his primary physician later this week and had laboratory drawn on Oct 09, 2018.  Hemoglobin 16.1 hematocrit 47.5.  MCV had improved to 102 from a high of 110.  Renal function was stable with a creatinine of 0.83.  LFTs were normal.  Total  cholesterol was increased to 260 but triglycerides were improved at 145, LDL was 115 HDL cholesterol was excellent at 116.  Hemoglobin A1c was 6.0.  He underwent successful reversal of his colostomy with Dr. Marlou Starks.  He was evaluated in December 2020 by Coletta Memos, NP after experiencing episodes of some sharp chest pain with bending over.  He was not felt to have ischemic chest pain.  His activity has been limited due to an L1 fracture and he has been wearing a lumbar brace.  He has been followed by Dr. Ellene Route.   When I last saw him in January 2021 I provided him with samples of combination bempedoic acid and Zetia with nasal is at 180/10 mg. Apparently, once the samples ran out, his insurance company denied paying for these since he does not have previously diagnosed significant CAD.   He was seen by Dr. Noemi Chapel of pulmonary in  April 2021.  An echo Doppler study on Sep 14, 2019 showed an EF of 60 to 65%.  There were no wall motion abnormalities.  Diastolic parameters were indeterminate.  He had been on Ofev for interstitial lung disease.  He now sees Dr. Chase Caller.  He developed progressive symptoms from his cervical spondylosis with myelopathy at C3-C4 cervical radiculopathy and underwent anterior cervical decompression C3-C4 arthrodesis with structural allograft anterior plate fixation by [name] on November 11, 2019. He is now followed by Dr. Chase Caller for his interstitial lung disease and has had subsequent pulmonary function tests. He is no longer on Ofev. He is unaware of any breakthrough atrial fibrillation. He continues to be on diltiazem 120 mg and metoprolol tartrate 25 mg twice a day for hypertension. He is on Eliquis 5 mg twice a day.  He was recently evaluated by Dr. Chase Caller and was told that his PFTs were a little more abnormal.  He had stopped his Ofev due to severe intractable nausea and vomiting and was feeling improved however he still experienced shortness of breath.  A high-resolution chest CT showed no significant interval change in the pattern of mild pulmonary fibrosis and the findings were "indeterminate for UIP."  Aortic atherosclerosis was present.  Presently, he admits to experiencing some  shortness of breath with activity and admits to some leg swelling at the end of the day on a daily basis.  He continues to be on diltiazem 240 mg, metoprolol tartrate 25 mg twice a day.  He is on Zetia 10 mg for hyperlipidemia in addition to low-dose rosuvastatin at 5 mg once a week.  He continues to be on low-dose prednisone at 5 mg and is on Eliquis anticoagulation.     Past Medical History:  Diagnosis Date  . Allergy to alpha-gal    2015; has been able to re-introduce red meat for the past 2 1/2 years without reaction (as of 01/21/19)  . Arthritis   . Chest pain    a. 03/2017: echo showing EF of 60-65%,  no regional WMA or significant valve abnormalities. b. 03/2016: NST with no evidence of ischemia.   . Complication of anesthesia    "anaphylactic" reaction after given xylocaine for shoulder injection > 10 years ago; reported no known reaction when given marcaine (as of 01/21/19)   . Digestive problems    on Prednisone prn for this issue- diarrhea  . Dyspnea    with activity  . Dysrhythmia    irregular due to Myocarditis- takes Atenolol, Norvasc  . GERD (gastroesophageal reflux disease)   . H/O  viral myocarditis    25 years ago  . Head injury, closed, with concussion    Breif LOC  . Hyperlipidemia   . Mild mitral valve prolapse    per Dr Evette Georges notes  . PAF (paroxysmal atrial fibrillation) ([place])    a. initially occuring in 02/2016. b. recurrent in 07/2016. Placed on Eliquis  . Polymyalgia rheumatica ([place])   . Pre-diabetes   . Pulmonary fibrosis ([place])   . Sleep apnea    mild sleep apnea   no CPAP    Past Surgical History:  Procedure Laterality Date  . ANTERIOR CERVICAL DECOMP/DISCECTOMY FUSION N/A 11/10/2019   Procedure: Cervical Three-Four Anterior cervical decompression/discectomy/fusion;  Surgeon: [name], MD;  Location: [facility];  Service: Neurosurgery;  Laterality: N/A;  Cervical Three-Four Anterior cervical decompression/discectomy/fusion  . apendectomy  1965  . APPENDECTOMY    . BACK SURGERY    . bone spur Bilateral 1999   feet  . CARDIAC CATHETERIZATION  2001  . CHOLECYSTECTOMY  2004  . COLON RESECTION N/A 06/15/2018   Procedure: SIGMOID COLON RESECTION;  Surgeon: [name], MD;  Location: [facility];  Service: General;  Laterality: N/A;  . COLONOSCOPY    . COLOSTOMY N/A 06/15/2018   Procedure: COLOSTOMY;  Surgeon: [name], MD;  Location: [facility];  Service: General;  Laterality: N/A;  . COLOSTOMY TAKEDOWN N/A 01/26/2019   Procedure: LAPAROSCOPIC ASSISTED COLOSTOMY TAKEDOWN;  Surgeon: [name], MD;  Location: [facility];  Service: General;  Laterality: N/A;  .  EYE SURGERY Bilateral    cataract removal  . LAPAROSCOPIC LYSIS OF ADHESIONS N/A 01/26/2019   Procedure: LAPAROSCOPIC LYSIS OF ADHESIONS;  Surgeon: [name], MD;  Location: [facility];  Service: General;  Laterality: N/A;  . LAPAROTOMY N/A 06/15/2018   Procedure: EXPLORATORY LAPAROTOMY;  Surgeon: [name], MD;  Location: [facility];  Service: General;  Laterality: N/A;  . LUMBAR LAMINECTOMY/ DECOMPRESSION WITH MET-RX Right 05/05/2013   Procedure: Right Lumbar three-four Extraforaminal Microdiskectomy with Metrex;  Surgeon: [name], MD;  Location: [facility];  Service: Neurosurgery;  Laterality: Right;  Right Lumbar three-four Extraforaminal Microdiskectomy with Metrex  . LUNG BIOPSY Right 02/19/2018   Procedure: LUNG BIOPSY;  Surgeon: [name], MD;  Location: [facility];  Service: Thoracic;  Laterality: Right;  . SHOULDER OPEN ROTATOR CUFF REPAIR Bilateral 2001  . TONSILLECTOMY    . VIDEO ASSISTED THORACOSCOPY Right 02/19/2018   Procedure: VIDEO ASSISTED THORACOSCOPY;  Surgeon: [name], MD;  Location: [place];  Service: Thoracic;  Laterality: Right;    Allergies  Allergen Reactions  . Xylocaine [Lidocaine] Anaphylaxis  . Codeine Nausea And Vomiting  . Statins Other (See Comments)    Muscle soreness and an aching feeling all over Myalgias  . Percocet [Oxycodone-Acetaminophen] Itching    Current Outpatient Medications  Medication Sig Dispense Refill  . acetaminophen (TYLENOL) 650 MG CR tablet Take 1,300 mg by mouth every 8 (eight) hours as needed for pain.    . budesonide-formoterol (SYMBICORT) 80-4.5 MCG/ACT inhaler Inhale 2 puffs into the lungs 2 (two) times daily.    . cetirizine (ZYRTEC) 10 MG tablet Take 10 mg by mouth daily as needed for allergies.     . Cholecalciferol (DIALYVITE VITAMIN D 5000) 125 MCG (5000 UT) capsule Take 5,000 Units by mouth daily.    . cholecalciferol (VITAMIN D3) 25 MCG (1000 UNIT) tablet Take 1,000 Units by mouth every evening.     . diclofenac Sodium (VOLTAREN) 1 %  GEL Apply 1 application topically 4 (four) times daily as needed (pain).    Marland Kitchen diltiazem (CARDIZEM CD) 240 MG 24 hr capsule Take 1 capsule (240 mg total) by mouth daily. 90 capsule 3  . diphenoxylate-atropine (LOMOTIL) 2.5-0.025 MG tablet Take 1 tablet by mouth 4 (four) times daily as needed for diarrhea or loose stools.    Marland Kitchen ELIQUIS 5 MG TABS tablet TAKE ONE TABLET (5MG TOTAL) BY MOUTH TWODAILY (Patient taking differently: Take 5 mg by mouth 2 (two) times daily.) 180 tablet 1  . ezetimibe (ZETIA) 10 MG tablet TAKE ONE (1) TABLET BY MOUTH EVERY DAY 90 tablet 3  . fluticasone (FLONASE) 50 MCG/ACT nasal spray Place 2 sprays into both nostrils daily as needed for allergies or rhinitis.    Marland Kitchen gabapentin (NEURONTIN) 300 MG capsule Take 300 mg by mouth 3 (three) times daily.     . metoprolol tartrate (LOPRESSOR) 25 MG tablet TAKE ONE TABLET BY MOUTH TWICE A DAY (Patient taking differently: Take 25 mg by mouth 2 (two) times daily.) 180 tablet 3  . nystatin (MYCOSTATIN) 100000 UNIT/ML suspension Take 5 mLs (500,000 Units total) by mouth every other day. (Patient taking differently: Take 5 mLs by mouth daily as needed (thrush).) 437 mL 11  . ondansetron (ZOFRAN) 4 MG tablet Take 4 mg by mouth every 8 (eight) hours as needed for nausea or vomiting.     . predniSONE (DELTASONE) 5 MG tablet Take 5 mg by mouth daily.     . rosuvastatin (CRESTOR) 10 MG tablet Take 0.5 tablets (5 mg total) by mouth once a week. 20 tablet 0  . spironolactone (ALDACTONE) 25 MG tablet Take 0.5 tablets (12.5 mg total) by mouth daily. 30 tablet 3  . tamsulosin (FLOMAX) 0.4 MG CAPS capsule Take 0.4 mg by mouth 2 (two) times daily.      No current facility-administered medications for this visit.    Social History   Socioeconomic History  . Marital status: Married    Spouse name: Not on file  . Number of children: Not on file  . Years of education: Not on file  . Highest education level: Not on  file  Occupational History  . Not on file  Tobacco Use  . Smoking status: Never Smoker  . Smokeless tobacco: Never Used  Vaping Use  . Vaping Use: Never used  Substance and Sexual Activity  . Alcohol use: Yes    Alcohol/week: 21.0 standard drinks    Types: 21 Glasses of wine per week    Comment: 2/3 glasses wine in evening  . Drug use: No  . Sexual activity: Yes    Birth control/protection: Other-see comments    Comment: old age  Other Topics Concern  . Not on file  Social History Narrative  . 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 He is married has 2 children. There is no tobacco use. He stays active. There is no alcohol use.  He is still working but had a significantly reduced pace than he had previously.  Family history is notable that both parents are deceased.  Mother died of old age.  Father died but had a history of mitral valve prolapse.  He has 2 brothers and one sister who are  alive and well.  ROS General: Negative; No fevers, chills, or night sweats;  HEENT: Negative; No changes in vision or hearing, sinus congestion, difficulty swallowing Pulmonary:  shortness of breath with activity; undergoing evaluation for chronic fibrosing interstitial lung disease Cardiovascular: See history of present illness GI: Status post recent sigmoid colon perforation requiring emergent surgery with subsequent peritonitis and colostomy insertion. GU: Negative; No dysuria, hematuria, or difficulty voiding Musculoskeletal: Negative; no myalgias, joint pain, or weakness Hematologic/Oncology: Negative; no easy bruising, bleeding Endocrine: Negative; no heat/cold intolerance; no diabetes Neuro: Negative; no changes in  balance, headaches Skin: Negative; No rashes or skin lesions Psychiatric: Negative; No behavioral problems, depression Sleep: Positive for OSA O on a home sleep study at [facility] pulmonary negative;  Other comprehensive 14 point system review is negative.  PE BP 130/78   Pulse 72   Ht '5\' 8"'  (1.727 m)   Wt 192 lb (87.1 kg)   BMI 29.19 kg/m   Repeat blood pressure by me was 124/70  Wt Readings from Last 3 Encounters:  07/29/20 192 lb (87.1 kg)  07/06/20 196 lb (88.9 kg)  05/18/20 188 lb 3.2 oz (85.4 kg)     Physical Exam BP 130/78   Pulse 72   Ht '5\' 8"'  (1.727 m)   Wt 192 lb (87.1 kg)   BMI 29.19 kg/m  General: Alert, oriented, no distress.  Skin: normal turgor, no rashes, warm and dry HEENT: Normocephalic, atraumatic. Pupils equal round and reactive to light; sclera anicteric; extraocular muscles intact;  Nose without nasal septal hypertrophy Mouth/Parynx benign; Mallinpatti scale 3 Neck: No JVD, no carotid bruits; normal carotid upstroke Lungs: clear to ausculatation and percussion; no wheezing or rales Chest wall: without tenderness to palpitation Heart: PMI not displaced, RRR, s1 s2 normal, 1/6 systolic murmur, no diastolic murmur, no rubs, gallops, thrills, or heaves Abdomen: soft, nontender; no hepatosplenomehaly, BS+; abdominal aorta nontender and not dilated by palpation. Back: no CVA tenderness Pulses 2+ Musculoskeletal: full range of motion, normal strength, no joint deformities Extremities: no clubbing cyanosis or edema, Homan's sign negative  Neurologic: grossly nonfocal; Cranial nerves grossly wnl Psychologic: Normal mood and affect   ECG (independently read by me): NSR at 72; no ectopy    January 2022  ECG (independently read by me): Normal sinus rhythm at 87 bpm.  Mild RV conduction delay.  No ectopy.  Low voltage.  Normal intervals  June 2020 ECG (independently read by me): Normal sinus rhythm at 70 bpm.  Incomplete right bundle branch block.  QTc  interval normal at 432 ms  06/25/2018 ECG (independently read by me): Normal sinus rhythm at 79 bpm.  QTc interval '4 4 7 ' ms.  05/12/2019 ECG (independently read by me): Normal sinus rhythm at 80 bpm.  No ectopy.  Normal intervals.  September 2018 ECG (independently read by me): Normal sinus rhythm at 65 bpm.  Isolated PAC.  Incomplete right bundle branch block.  Normal intervals.  March 2018 ECG (independently read by me): Normal sinus rhythm at 79 bpm.  QTc interval 460 ms.  PR interval normal at 158 ms.  November 2017 ECG (independently read by me): Normal sinus rhythm at 99 bpm, incomplete right bundle branch block.  Nondiagnostic ST-T changes.  January 2017 ECG (independently read by me): Normal sinus rhythm at 82 bpm.  Mild RV conduction delay.  Normal intervals.  No significant ST-T changes.  December 2015 ECG (independently read by me): Normal  sinus rhythm at 74 bpm.  Mild RV conduction delay.  QTc interval 461 ms.  December 2014 ECG: Sinus rhythm at 57 beats per minute. No ectopy. Normal intervals.  LABS:  BMP Latest Ref Rng & Units 11/06/2019 10/22/2019 07/21/2019  Glucose 70 - 99 mg/dL 159(H) 101(H) 131(H)  BUN 8 - 23 mg/dL '9 13 13  ' Creatinine 0.61 - 1.24 mg/dL 0.83 0.86 0.91  Sodium 135 - 145 mmol/L 138 135 139  Potassium 3.5 - 5.1 mmol/L 4.0 4.0 4.3  Chloride 98 - 111 mmol/L 102 102 103  CO2 22 - 32 mmol/L '23 27 27  ' Calcium 8.9 - 10.3 mg/dL 9.2 9.0 9.5   Hepatic Function Latest Ref Rng & Units 10/22/2019 07/21/2019 01/15/2019  Total Protein 6.0 - 8.3 g/dL 6.3 6.1 5.9(L)  Albumin 3.5 - 5.2 g/dL 4.2 3.8 3.6  AST 0 - 37 U/L '19 15 19  ' ALT 0 - 53 U/L '12 17 13  ' Alk Phosphatase 39 - 117 U/L 56 52 55  Total Bilirubin 0.2 - 1.2 mg/dL 1.2 1.0 1.4(H)  Bilirubin, Direct 0.0 - 0.3 mg/dL 0.2 - -   CBC Latest Ref Rng & Units 11/06/2019 10/22/2019 01/29/2019  WBC 4.0 - 10.5 K/uL 8.8 7.7 11.6(H)  Hemoglobin 13.0 - 17.0 g/dL 16.9 16.2 13.4  Hematocrit 39.0 - 52.0 % 50.1 47.9 37.5(L)   Platelets 150 - 400 K/uL 210 213.0 180   Lab Results  Component Value Date   MCV 103.7 (H) 11/06/2019   MCV 104.5 (H) 10/22/2019   MCV 105.9 (H) 01/29/2019   Erythrocyte Sedimentation Rate     Component Value Date/Time   ESRSEDRATE 2 10/22/2019 1709   CRP November 2017: 139  Repeat CRP 07/16/16:  6  Lab Results  Component Value Date   TSH 2.31 07/16/2016     Lab Results  Component Value Date   HGBA1C 6.9 (H) 02/17/2018     Lipid Panel     Component Value Date/Time   CHOL 222 (H) 07/16/2016 0902   TRIG 359 (H) 07/16/2016 0902   HDL 67 07/16/2016 0902   CHOLHDL 3.3 07/16/2016 0902   VLDL 72 (H) 07/16/2016 0902   LDLCALC 83 07/16/2016 0902   ------------------------------------------------------------------------------------------------------------- Oxygen Saturation: Rest: 92% with pulse of 93  Following walking in the office: 93% with a pulse of 106  03/21/2016 Nuclear Study Highlights    The left ventricular ejection fraction is normal (55-65%).  Nuclear stress EF: 61%.  The study is normal.  This is a low risk study.  There was no ST segment deviation noted during stress.     ------------------------------------------------------------------- 03/22/2016 ECHO Study Conclusions  - Left ventricle: The cavity size was normal. Wall thickness was   normal. Systolic function was normal. The estimated ejection   fraction was in the range of 60% to 65%. Wall motion was normal;   there were no regional wall motion abnormalities. Doppler   parameters are consistent with abnormal left ventricular   relaxation (grade 1 diastolic dysfunction). - Aortic valve: There was no stenosis. - Aorta: Mildly dilated aortic root. Aortic root dimension: 38 mm   (ED). - Mitral valve: There was no significant regurgitation. - Right ventricle: The cavity size was normal. Systolic function   was normal. - Pulmonary arteries: No complete TR doppler jet so unable to   estimate  PA systolic pressure. - Inferior vena cava: The vessel was normal in size. The   respirophasic diameter changes were in the normal range (>= 50%),  consistent with normal central venous pressure.  Impressions:  - Normal LV size with EF 60-65%. Normal RV size and systolic   function. No significant valvular abnormalities.  --------------------------------------------------------------------------------------------  04/02/2016 EXAM: CT CHEST WITHOUT CONTRAST  TECHNIQUE: Multidetector CT imaging of the chest was performed following the standard protocol without IV contrast.  COMPARISON:  None.  FINDINGS: Cardiovascular: The heart size is normal. No pericardial effusion. Atherosclerotic calcification is noted in the wall of the thoracic aorta.  Mediastinum/Nodes: No mediastinal lymphadenopathy. No evidence for gross hilar lymphadenopathy although assessment is limited by the lack of intravenous contrast on today's study. There is no axillary lymphadenopathy.  Lungs/Pleura: Fine detail obscured by breathing motion. Dependent linear and platelike atelectasis noted in the lower lungs bilaterally. Components of associated parenchymal scarring not excluded. No evidence for pulmonary edema. No dense focal airspace consolidation. No evidence for pleural effusion.  Upper Abdomen: The liver shows diffusely decreased attenuation suggesting steatosis. Gallbladder surgically absent.  Musculoskeletal: Degenerative changes noted in the shoulders bilaterally.  IMPRESSION: Relatively diffuse atelectatic changes in the lungs bilaterally. Study degraded by patient breathing motion.  No specific findings to explain the patient's history of shortness of breath.   06/13/2016 CT Chest High Resolution  FINDINGS: Cardiovascular: Atherosclerotic calcification of the arterial vasculature. Heart size normal. No pericardial effusion.  Mediastinum/Nodes: No pathologically enlarged  mediastinal or axillary lymph nodes. Hilar regions are difficult to evaluate without IV contrast. Esophagus is grossly unremarkable.  Lungs/Pleura: Mild basilar predominant subpleural reticulation and ground-glass with minimal architectural distortion. No definite traction bronchiectasis/bronchiolectasis or honeycombing. Findings are likely unchanged from 04/02/2016 but there was a great deal of respiratory motion on that exam. There may be minimal air trapping. No pleural fluid. Airway is unremarkable.  Upper Abdomen: Visualized portions of the liver, adrenal glands and right kidney are grossly unremarkable. Punctate stone in the left kidney. Visualized portions of the spleen, pancreas, stomach and bowel are grossly unremarkable. Cholecystectomy. No upper abdominal adenopathy.  Musculoskeletal: No worrisome lytic or sclerotic lesions. Degenerative changes are seen in the spine.  IMPRESSION: 1. Mild basilar predominant subpleural reticulation and ground-glass, suggesting nonspecific interstitial pneumonitis. Usual interstitial pneumonitis cannot be excluded on this baseline examination. 2.  Aortic atherosclerosis (ICD10-170.0). 3. Punctate stone left kidney.   IMPRESSION:  1. Essential hypertension   2. PAF (paroxysmal atrial fibrillation) ([place])   3. ILD (interstitial lung disease) ([place])   4. Medication management     ASSESSMENT AND PLAN: 1.  CAD: Mild mid systolic LAD bridging noted at catheterization in 2001.  He is not having any anginal symptomatology.  Tolerated emergent bowel surgery without cardiovascular compromise.   2 PAF: At his last evaluation by me in January 2022, Cardizem dose was increased to 240 mg daily for more optimal blood pressure control.  He continues to be on metoprolol 25 mg twice a day.  He is unaware of recurrent atrial fibrillation.  He is maintaining sinus rhythm on ECG and is on Eliquis for anticoagulation.    3. Eliquis for  anticoagulation. He ultimately may also require future neurovascular surgery at which time Eliquis will need to be held for minimum of 72 hours.  4.  Exertional dyspnea and new leg swelling.  His blood pressure today is stable.  I will initiate low-dose spironolactone 12.5 mg daily which would be helpful for his leg edema as well as potential diastolic dysfunction contributing to his exertional dyspnea.  We will recheck the mag, BNP and TSH today and will recheck an echo Doppler  study to make certain there has not been any decline in LV function from his previous evaluation.  5.  Fibrosing nonspecific interstitial pneumonitis initially followed by [name].  He had seen Noemi Chapel and is now seen Dr. Chase Caller.  He did not tolerate OFF secondary to severe nausea and vomiting.  Recent PFTs showed slight reduction from previous evaluation.  6.  Spontaneous sigmoid colon perforation requiring emergent surgery with subsequent peritonitis and colostomy.  He underwent successful reversal of colostomy with Dr. Marlou Starks tolerated this well from a cardiac standpoint,  7.  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  7.  Mixed hyperlipidemia: He has been on Zetia and was started on omega-3 fatty acid.  Prior lipid studies showed a total cholesterol 260 with LDL 115.  HDL is excellent at 116, triglycerides have improved at 145.  F/U laboratory by Dr. Wende Neighbors showed total cholesterol 277, triglycerides 174, LDL 156 and HDL 90 from April 15, 2019.  He has been on Zetia and most recently has been able to tolerate very low-dose rosuvastatin 5 mg once a week.  Dr. [place] Crane will be rechecking laboratory.  Aortic atherosclerosis was noted on chest CT.  8.  History of alpha gal deficiency: Stabilized  9.  OSA on [place] home sleep study study July 2018.  AHI 27.4 with oxygen desaturation to a nadir of 81%; followed by [place] [facility].  Troy Sine,  MD, [facility]  07/29/2020 10:15 AM

## 2020-07-29 NOTE — Patient Instructions (Signed)
Medication Instructions:  START SPIRONOLACTONE 12.5mg  ONCE DAILY *If you need a refill on your cardiac medications before your next appointment, please call your pharmacy*  Lab Work: BMET, TSH, BNP- TODAY  If you have labs (blood work) drawn today and your tests are completely normal, you will receive your results only by: Marland Kitchen MyChart Message (if you have MyChart) OR . A paper copy in the mail If you have any lab test that is abnormal or we need to change your treatment, we will call you to review the results.  Testing/Procedures: Your physician has requested that you have an echocardiogram IN MAY 2022. Echocardiography is a painless test that uses sound waves to create images of your heart. It provides your doctor with information about the size and shape of your heart and how well your heart's chambers and valves are working. You may receive an ultrasound enhancing agent through an IV if needed to better visualize your heart during the echo.This procedure takes approximately one hour. There are no restrictions for this procedure. This will take place at the [address], [address].   Follow-Up: At [facility], you and your health needs are our priority.  As part of our continuing mission to provide you with exceptional heart care, we have created designated Provider Care Teams.  These Care Teams include your primary Cardiologist (physician) and Advanced Practice Providers (APPs -  Physician Assistants and Nurse Practitioners) who all work together to provide you with the care you need, when you need it.  Your next appointment:   AFTER ECHO IN MAY   The format for your next appointment:   In Person  Provider:   Shelva Majestic, MD

## 2020-07-30 ENCOUNTER — Encounter: Payer: Self-pay | Admitting: Cardiovascular Disease

## 2020-07-30 LAB — BASIC METABOLIC PANEL
BUN/Creatinine Ratio: 8 — ABNORMAL LOW (ref 10–24)
BUN: 8 mg/dL (ref 8–27)
CO2: 24 mmol/L (ref 20–29)
Calcium: 9.2 mg/dL (ref 8.6–10.2)
Chloride: 99 mmol/L (ref 96–106)
Creatinine, Ser: 0.98 mg/dL (ref 0.76–1.27)
Glucose: 79 mg/dL (ref 65–99)
Potassium: 3.9 mmol/L (ref 3.5–5.2)
Sodium: 140 mmol/L (ref 134–144)
eGFR: 82 mL/min/{1.73_m2} (ref >59)

## 2020-07-30 LAB — TSH: TSH: 3.44 u[IU]/mL (ref 0.450–4.500)

## 2020-07-30 LAB — BRAIN NATRIURETIC PEPTIDE: BNP: 43.3 pg/mL (ref 0.0–100.0)

## 2020-08-10 DIAGNOSIS — J8410 Pulmonary fibrosis, unspecified: Secondary | ICD-10-CM | POA: Diagnosis not present

## 2020-08-10 DIAGNOSIS — I10 Essential (primary) hypertension: Secondary | ICD-10-CM | POA: Diagnosis not present

## 2020-08-10 DIAGNOSIS — I4891 Unspecified atrial fibrillation: Secondary | ICD-10-CM | POA: Diagnosis not present

## 2020-08-10 DIAGNOSIS — E1169 Type 2 diabetes mellitus with other specified complication: Secondary | ICD-10-CM | POA: Diagnosis not present

## 2020-08-10 DIAGNOSIS — G729 Myopathy, unspecified: Secondary | ICD-10-CM | POA: Diagnosis not present

## 2020-08-16 DIAGNOSIS — I4581 Long QT syndrome: Secondary | ICD-10-CM | POA: Diagnosis not present

## 2020-08-16 DIAGNOSIS — Z7901 Long term (current) use of anticoagulants: Secondary | ICD-10-CM | POA: Diagnosis not present

## 2020-08-16 DIAGNOSIS — E0965 Drug or chemical induced diabetes mellitus with hyperglycemia: Secondary | ICD-10-CM | POA: Diagnosis not present

## 2020-08-16 DIAGNOSIS — E1165 Type 2 diabetes mellitus with hyperglycemia: Secondary | ICD-10-CM | POA: Diagnosis not present

## 2020-08-16 DIAGNOSIS — E782 Mixed hyperlipidemia: Secondary | ICD-10-CM | POA: Diagnosis not present

## 2020-08-16 DIAGNOSIS — E7420 Disorders of galactose metabolism, unspecified: Secondary | ICD-10-CM | POA: Diagnosis not present

## 2020-08-16 DIAGNOSIS — I10 Essential (primary) hypertension: Secondary | ICD-10-CM | POA: Diagnosis not present

## 2020-08-16 DIAGNOSIS — M353 Polymyalgia rheumatica: Secondary | ICD-10-CM | POA: Diagnosis not present

## 2020-08-16 DIAGNOSIS — Z7951 Long term (current) use of inhaled steroids: Secondary | ICD-10-CM | POA: Diagnosis not present

## 2020-08-16 DIAGNOSIS — R945 Abnormal results of liver function studies: Secondary | ICD-10-CM | POA: Diagnosis not present

## 2020-08-16 DIAGNOSIS — Z7952 Long term (current) use of systemic steroids: Secondary | ICD-10-CM | POA: Diagnosis not present

## 2020-08-16 DIAGNOSIS — I2510 Atherosclerotic heart disease of native coronary artery without angina pectoris: Secondary | ICD-10-CM | POA: Diagnosis not present

## 2020-08-18 DIAGNOSIS — G4730 Sleep apnea, unspecified: Secondary | ICD-10-CM | POA: Diagnosis not present

## 2020-08-18 DIAGNOSIS — M8080XD Other osteoporosis with current pathological fracture, unspecified site, subsequent encounter for fracture with routine healing: Secondary | ICD-10-CM | POA: Diagnosis not present

## 2020-08-18 DIAGNOSIS — F5101 Primary insomnia: Secondary | ICD-10-CM | POA: Diagnosis not present

## 2020-08-18 DIAGNOSIS — E782 Mixed hyperlipidemia: Secondary | ICD-10-CM | POA: Diagnosis not present

## 2020-08-18 DIAGNOSIS — I10 Essential (primary) hypertension: Secondary | ICD-10-CM | POA: Diagnosis not present

## 2020-08-18 DIAGNOSIS — E0965 Drug or chemical induced diabetes mellitus with hyperglycemia: Secondary | ICD-10-CM | POA: Diagnosis not present

## 2020-08-18 DIAGNOSIS — I482 Chronic atrial fibrillation, unspecified: Secondary | ICD-10-CM | POA: Diagnosis not present

## 2020-08-18 DIAGNOSIS — M545 Low back pain, unspecified: Secondary | ICD-10-CM | POA: Diagnosis not present

## 2020-08-18 DIAGNOSIS — R0602 Shortness of breath: Secondary | ICD-10-CM | POA: Diagnosis not present

## 2020-08-18 DIAGNOSIS — I2510 Atherosclerotic heart disease of native coronary artery without angina pectoris: Secondary | ICD-10-CM | POA: Diagnosis not present

## 2020-08-18 DIAGNOSIS — M25519 Pain in unspecified shoulder: Secondary | ICD-10-CM | POA: Diagnosis not present

## 2020-08-18 DIAGNOSIS — J849 Interstitial pulmonary disease, unspecified: Secondary | ICD-10-CM | POA: Diagnosis not present

## 2020-08-18 DIAGNOSIS — M353 Polymyalgia rheumatica: Secondary | ICD-10-CM | POA: Diagnosis not present

## 2020-08-25 DIAGNOSIS — Z7952 Long term (current) use of systemic steroids: Secondary | ICD-10-CM | POA: Diagnosis not present

## 2020-08-25 DIAGNOSIS — M353 Polymyalgia rheumatica: Secondary | ICD-10-CM | POA: Diagnosis not present

## 2020-08-25 DIAGNOSIS — J84112 Idiopathic pulmonary fibrosis: Secondary | ICD-10-CM | POA: Diagnosis not present

## 2020-08-25 DIAGNOSIS — M25511 Pain in right shoulder: Secondary | ICD-10-CM | POA: Diagnosis not present

## 2020-08-25 DIAGNOSIS — E663 Overweight: Secondary | ICD-10-CM | POA: Diagnosis not present

## 2020-08-25 DIAGNOSIS — R768 Other specified abnormal immunological findings in serum: Secondary | ICD-10-CM | POA: Diagnosis not present

## 2020-08-25 DIAGNOSIS — S32010D Wedge compression fracture of first lumbar vertebra, subsequent encounter for fracture with routine healing: Secondary | ICD-10-CM | POA: Diagnosis not present

## 2020-08-25 DIAGNOSIS — Z6828 Body mass index (BMI) 28.0-28.9, adult: Secondary | ICD-10-CM | POA: Diagnosis not present

## 2020-08-25 DIAGNOSIS — M542 Cervicalgia: Secondary | ICD-10-CM | POA: Diagnosis not present

## 2020-08-25 DIAGNOSIS — M8080XD Other osteoporosis with current pathological fracture, unspecified site, subsequent encounter for fracture with routine healing: Secondary | ICD-10-CM | POA: Diagnosis not present

## 2020-09-11 DIAGNOSIS — E1165 Type 2 diabetes mellitus with hyperglycemia: Secondary | ICD-10-CM | POA: Diagnosis not present

## 2020-09-11 DIAGNOSIS — I2510 Atherosclerotic heart disease of native coronary artery without angina pectoris: Secondary | ICD-10-CM | POA: Diagnosis not present

## 2020-09-13 ENCOUNTER — Ambulatory Visit (HOSPITAL_COMMUNITY): Payer: PPO | Attending: Cardiovascular Disease

## 2020-09-13 ENCOUNTER — Other Ambulatory Visit: Payer: Self-pay

## 2020-09-13 DIAGNOSIS — I480 Paroxysmal atrial fibrillation: Secondary | ICD-10-CM | POA: Diagnosis not present

## 2020-09-13 DIAGNOSIS — J849 Interstitial pulmonary disease, unspecified: Secondary | ICD-10-CM | POA: Diagnosis not present

## 2020-09-13 DIAGNOSIS — I10 Essential (primary) hypertension: Secondary | ICD-10-CM | POA: Diagnosis not present

## 2020-09-13 LAB — ECHOCARDIOGRAM COMPLETE
Area-P 1/2: 3.37 cm2
S' Lateral: 3 cm

## 2020-09-13 MED ORDER — PERFLUTREN LIPID MICROSPHERE
1.0000 mL | INTRAVENOUS | Status: AC | PRN
  Administered 2020-09-13: 2 mL via INTRAVENOUS

## 2020-09-22 DIAGNOSIS — M25511 Pain in right shoulder: Secondary | ICD-10-CM | POA: Diagnosis not present

## 2020-09-22 DIAGNOSIS — M542 Cervicalgia: Secondary | ICD-10-CM | POA: Diagnosis not present

## 2020-09-22 DIAGNOSIS — M2550 Pain in unspecified joint: Secondary | ICD-10-CM | POA: Diagnosis not present

## 2020-09-22 DIAGNOSIS — Z7952 Long term (current) use of systemic steroids: Secondary | ICD-10-CM | POA: Diagnosis not present

## 2020-09-22 DIAGNOSIS — S32010D Wedge compression fracture of first lumbar vertebra, subsequent encounter for fracture with routine healing: Secondary | ICD-10-CM | POA: Diagnosis not present

## 2020-09-22 DIAGNOSIS — J84112 Idiopathic pulmonary fibrosis: Secondary | ICD-10-CM | POA: Diagnosis not present

## 2020-09-22 DIAGNOSIS — M353 Polymyalgia rheumatica: Secondary | ICD-10-CM | POA: Diagnosis not present

## 2020-09-22 DIAGNOSIS — M8080XD Other osteoporosis with current pathological fracture, unspecified site, subsequent encounter for fracture with routine healing: Secondary | ICD-10-CM | POA: Diagnosis not present

## 2020-09-22 DIAGNOSIS — E663 Overweight: Secondary | ICD-10-CM | POA: Diagnosis not present

## 2020-09-22 DIAGNOSIS — R768 Other specified abnormal immunological findings in serum: Secondary | ICD-10-CM | POA: Diagnosis not present

## 2020-09-22 DIAGNOSIS — Z6827 Body mass index (BMI) 27.0-27.9, adult: Secondary | ICD-10-CM | POA: Diagnosis not present

## 2020-09-28 ENCOUNTER — Encounter: Payer: Self-pay | Admitting: Cardiovascular Disease

## 2020-09-28 ENCOUNTER — Ambulatory Visit: Payer: PPO | Admitting: Cardiovascular Disease

## 2020-09-28 ENCOUNTER — Other Ambulatory Visit: Payer: Self-pay

## 2020-09-28 VITALS — BP 112/64 | HR 70 | Ht 68.0 in | Wt 188.8 lb

## 2020-09-28 DIAGNOSIS — I480 Paroxysmal atrial fibrillation: Secondary | ICD-10-CM | POA: Diagnosis not present

## 2020-09-28 DIAGNOSIS — I10 Essential (primary) hypertension: Secondary | ICD-10-CM

## 2020-09-28 NOTE — Patient Instructions (Signed)
Medication Instructions:  Your physician recommends that you continue on your current medications as directed. Please refer to the Current Medication list given to you today.  *If you need a refill on your cardiac medications before your next appointment, please call your pharmacy*   Lab Work: None ordered.    Testing/Procedures: None ordered.    Follow-Up: At [facility], you and your health needs are our priority.  As part of our continuing mission to provide you with exceptional heart care, we have created designated Provider Care Teams.  These Care Teams include your primary Cardiologist (physician) and Advanced Practice Providers (APPs -  Physician Assistants and Nurse Practitioners) who all work together to provide you with the care you need, when you need it.  We recommend signing up for the patient portal called "MyChart".  Sign up information is provided on this After Visit Summary.  MyChart is used to connect with patients for Virtual Visits (Telemedicine).  Patients are able to view lab/test results, encounter notes, upcoming appointments, etc.  Non-urgent messages can be sent to your provider as well.   To learn more about what you can do with MyChart, go to [email or web address].    Your next appointment:   6 month(s)  The format for your next appointment:   In Person  Provider:   Thomas Kelly, MD     

## 2020-09-28 NOTE — Progress Notes (Signed)
[name], male   DOB: 12-30-1945, 75 y.o.   MRN: [id number]    Primary M.D.: Dr. Wende Neighbors  HPI: [name] is a 75 y.o. male who presents for a 2 month cardiology follow-up evaluation.   [name] has remote history of viral myocarditis in 1989 at which time his ejection fraction was 25%. He subsequently normalized LV function. He has a history of mild mitral valve prolapse. Cardiac catheterization in 2001 showed mild mid systolic LAD bridging. He has been noted to have mild T-wave changes on his ECG. Additional problems include hypertension as well as hyperlipidemia. He was unable to tolerate Lipitor. He initially tolerated Crestor but then developed significant myalgias.  He has been able to tolerate Zetia 10 mg daily.   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  Apparently, this has stabilized and is tired.  Ears have almost normalized.  He has been starting to resume very small amounts of red meat.    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  He denies any episodes of chest pain, or change in exercise tolerance.  On 02/01/2015.  He underwent lumbar surgery by [name] involving L2-L3, L3-L4, and L4-L5.  He tolerated surgery without cardiovascular compromise.  When I  saw him in January 2017 he was on amlodipine 2.5 mg and atenolol 25 g twice a day, which has been helpful for his blood pressure, palpitations, as well as documented systolic LAD muscle bridging.    At a subsequent office visit in November 2017 he underwent an echo Doppler study which was done on 03/22/2016 and this showed normal systolic function with an EF of 60-65% with grade 1 diastolic dysfunction.  His aorta was upper normal to mildly dilated at 38 mm.  There was no significant valvular pathology.  There was no TR Doppler jets an estimated PA systolic pressure was not able to be obtained.  Due to his  shortness of breath.  He also underwent a CT of his chest.  He had dependent linear and platelike atelectasis in lower lungs bilaterally.  The patient has a remote exposure to asbestosis in his 68s, but his CT scan did not reveal any evidence for pleural based calcification.  There were diffuse atelectatic changes in the lungs bilaterally.  He also underwent a nuclear perfusion study which showed normal perfusion and function without scar or ischemia.  I had sent off laboratory was notable for a normal BMP 53.  Troponins were negative.  He had been prescribed levaquin by his primary physician as empiric therapy for a mild fever.  His fever resolved but he continues to experience shortness of breath.  His white blood count 13 days ago was 11.2.  He was macrocytic with an MCV of 102 with a normal hemoglobin at 16.2, hematocrit of 46.0.  B12 and folate levels were normal.  An erythrocyte sedimentation rate was increased at 61 and a high-sensitivity C-reactive protein was significantly increased at 139.    I referred him to [name] for pulmonary evaluation for possible interstitial lung disease and also [name] for rheumatologic follow-up.  He was given increased steroids.  In November 2017.  A high resolution CT scan of the chest showed patchy groundglass bilaterally andpatchy lower lobe atelectasis.  A barium swallow did not show evidence for esophageal disorder.  Antineutrophil cytoplasmic antibody testing was negative.  A follow-up high-resolution CT  was done on June 12, 2016, which showed mild basilar predominant subpleural reticulation and groundglass suggesting nonspecific interstitial pneumonitis, however, will interstitial pneumonitis could not be excluded.  He was also noted to have aortic atherosclerosis and a punctate stone in his left kidney.  When seen in 2018 he felt improved.  Subsequent laboratory had shown a sedimentation rate which improved from 61 to  1 and CRP which had improved from  139 to 6.8.  His lipids were elevated with a total cholesterol 222, triglycerides 359, HDL 67, VLDL 72, and LDL 83.  He developed myalgias with Crestor and Lipitor.  He denies chest pain. He is concerned about the cost of soe ofhis medicatios, partiyby.  He continues to be on pradaxa for anticoagulation and long-acting Cardizem.  He is unaware of any recent arrhythmia.  He had follow-up blood work on 01/15/2017 by Wende Neighbors in [place].  Hemoglobin 17.5, hematocrit 48.7.  Lipid studies were improved with a total cholesterol 220, triglycerides 175, HDL 72, LDL 113.  Hemoglobin A1c was 5.7.  He is on CPAP therapy.  He nasal pillows, small size, the D[employer] [facility].  He has undergone more extensive pulmonary evaluation for his diffuse parenchymal lung disease of undetermined cause.  The February 19, 2018 he underwent an open lung biopsy by Dr. Roxan Hockey and there was some suggestion of possible early fibrotic process suggestive of UIP.  Subsequently, his biopsy was evaluated at [facility] by Dr. Reggie Pile.  He was felt to have chronic fibrosing interstitial pneumonia with an unclassifiable pattern.  They did query the possibility of association with an underlying systemic inflammatory or connective tissue disorder.  They did not believe he had UIP. He has also undergone follow-up rheumatologic evaluation.  He was started on a trial of Ofev.  When I  saw him in December 2019 he denied any chest pain or palpitations, PND orthopnea,  presyncope or syncope. He was not short of breath at rest but does get short of breath with mild activity. He was on chronic steroids and inhalers and was on Cellcept and Ofev, followed by Dr Lake Bells.  He was admitted to the hospital on 06/15/2018 with acute diverticulitis requiring exploratory laparotomy and colostomy.The pt was taken off Cellcept and Ofev on admissionPostoperatively his QTC was noted to be prolonged-his QTC on 06/24/2018 was 624. Antibiotics were  adjusted.He saw Kerin Ransom, [facility]  for further evaluation on 06/25/2018. and f/u EKG. His QTC  in the office was 447. He is slowly improving. No palpitations or tachycardia, no unusual dyspnea. He has noted his B/P has been running YYT-03'T systolic.  I evaluated  him in May 2020 with a telemedicine visit.  At that time, he told me that he will be in need for reversal of his colostomy with colostomy takedown and sometime this year will also require back surgery by Dr. Ellene Route.  His lung disease has improved with reinstitution of CellCept.  He denied any chest pain PND orthopnea.  He denies any palpitations.  He was seen 1 month later for follow-up evaluation and his breathing was significantly improved.  He was able to walk for significant duration recently went and noticed significant improvement from previously.  He is scheduled to see his primary physician later this week and had laboratory drawn on Oct 09, 2018.  Hemoglobin 16.1 hematocrit 47.5.  MCV had improved to 102 from a high of 110.  Renal function was stable with a creatinine of 0.83.  LFTs were normal.  Total  cholesterol was increased to 260 but triglycerides were improved at 145, LDL was 115 HDL cholesterol was excellent at 116.  Hemoglobin A1c was 6.0.  He underwent successful reversal of his colostomy with Dr. Marlou Starks.  He was evaluated in December 2020 by Coletta Memos, NP after experiencing episodes of some sharp chest pain with bending over.  He was not felt to have ischemic chest pain.  His activity has been limited due to an L1 fracture and he has been wearing a lumbar brace.  He has been followed by Dr. Ellene Route.   When I last saw him in January 2021 I provided him with samples of combination bempedoic acid and Zetia with nasal is at 180/10 mg. Apparently, once the samples ran out, his insurance company denied paying for these since he does not have previously diagnosed significant CAD.   He was seen by Dr. Noemi Chapel of pulmonary in  April 2021.  An echo Doppler study on Sep 14, 2019 showed an EF of 60 to 65%.  There were no wall motion abnormalities.  Diastolic parameters were indeterminate.  He had been on Ofev for interstitial lung disease.  He now sees Dr. Chase Caller.  He developed progressive symptoms from his cervical spondylosis with myelopathy at C3-C4 cervical radiculopathy and underwent anterior cervical decompression C3-C4 arthrodesis with structural allograft anterior plate fixation by [name] on November 11, 2019. He is now followed by Dr. Chase Caller for his interstitial lung disease and has had subsequent pulmonary function tests. He is no longer on Ofev. He is unaware of any breakthrough atrial fibrillation. He continues to be on diltiazem 120 mg and metoprolol tartrate 25 mg twice a day for hypertension. He is on Eliquis 5 mg twice a day.  He was recently evaluated by Dr. Chase Caller and was told that his PFTs were a little more abnormal.  He had stopped his Ofev due to severe intractable nausea and vomiting and was feeling improved however he still experienced shortness of breath.  A high-resolution chest CT showed no significant interval change in the pattern of mild pulmonary fibrosis and the findings were "indeterminate for UIP."  Aortic atherosclerosis was present.  I last saw him on July 29, 2020 at which time he admitted to experiencing some shortness of breath with activity as well as leg swelling at the end of the day on a daily basis. He was on diltiazem 240 mg, metoprolol tartrate 25 mg twice a day.  He is on Zetia 10 mg for hyperlipidemia in addition to low-dose rosuvastatin at 5 mg once a week.  He continued to be on low-dose prednisone at 5 mg and  Eliquis anticoagulation.  During that evaluation I recommended laboratory be checked and I initiated low-dose spironolactone 12.5 mg daily which would be helpful both for leg edema as well as potential diastolic dysfunction contributing to his exertional dyspnea.   I also recommended a follow-up echo Doppler evaluation.  He underwent an echo Doppler study on Sep 13, 2020 which showed EF 55 to 60% with grade 1 diastolic dysfunction.  There was no evidence for pulmonic stenosis.  Pulmonary pressures were normal.  He subsequently underwent laboratory by [name] who follows him for polymyalgia.  Erythrocyte sedimentation rate was 11.  C-reactive protein was 16.  He had normal renal function with a creatinine of 1.06 and potassium of 4.2.  Is only he denies any chest pain.  He still experiences some shortness of breath with exertion.  He has issues with chronic  back pain.  Leg swelling had improved.  He presents for follow-up evaluation.   Past Medical History:  Diagnosis Date  . Allergy to alpha-gal    2015; has been able to re-introduce red meat for the past 2 1/2 years without reaction (as of 01/21/19)  . Arthritis   . Chest pain    a. 03/2017: echo showing EF of 60-65%, no regional WMA or significant valve abnormalities. b. 03/2016: NST with no evidence of ischemia.   . Complication of anesthesia    "anaphylactic" reaction after given xylocaine for shoulder injection > 10 years ago; reported no known reaction when given marcaine (as of 01/21/19)   . Digestive problems    on Prednisone prn for this issue- diarrhea  . Dyspnea    with activity  . Dysrhythmia    irregular due to Myocarditis- takes Atenolol, Norvasc  . GERD (gastroesophageal reflux disease)   . H/O viral myocarditis    25 years ago  . Head injury, closed, with concussion    Breif LOC  . Hyperlipidemia   . Mild mitral valve prolapse    per Dr Evette Georges notes  . PAF (paroxysmal atrial fibrillation) ([place])    a. initially occuring in 02/2016. b. recurrent in 07/2016. Placed on Eliquis  . Polymyalgia rheumatica ([place])   . Pre-diabetes   . Pulmonary fibrosis ([place])   . Sleep apnea    mild sleep apnea   no CPAP    Past Surgical History:  Procedure Laterality Date  . ANTERIOR CERVICAL  DECOMP/DISCECTOMY FUSION N/A 11/10/2019   Procedure: Cervical Three-Four Anterior cervical decompression/discectomy/fusion;  Surgeon: [name], MD;  Location: [facility];  Service: Neurosurgery;  Laterality: N/A;  Cervical Three-Four Anterior cervical decompression/discectomy/fusion  . apendectomy  1965  . APPENDECTOMY    . BACK SURGERY    . bone spur Bilateral 1999   feet  . CARDIAC CATHETERIZATION  2001  . CHOLECYSTECTOMY  2004  . COLON RESECTION N/A 06/15/2018   Procedure: SIGMOID COLON RESECTION;  Surgeon: [name], MD;  Location: [facility];  Service: General;  Laterality: N/A;  . COLONOSCOPY    . COLOSTOMY N/A 06/15/2018   Procedure: COLOSTOMY;  Surgeon: [name], MD;  Location: [facility];  Service: General;  Laterality: N/A;  . COLOSTOMY TAKEDOWN N/A 01/26/2019   Procedure: LAPAROSCOPIC ASSISTED COLOSTOMY TAKEDOWN;  Surgeon: [name], MD;  Location: [facility];  Service: General;  Laterality: N/A;  . EYE SURGERY Bilateral    cataract removal  . LAPAROSCOPIC LYSIS OF ADHESIONS N/A 01/26/2019   Procedure: LAPAROSCOPIC LYSIS OF ADHESIONS;  Surgeon: [name], MD;  Location: [facility];  Service: General;  Laterality: N/A;  . LAPAROTOMY N/A 06/15/2018   Procedure: EXPLORATORY LAPAROTOMY;  Surgeon: [name], MD;  Location: [facility];  Service: General;  Laterality: N/A;  . LUMBAR LAMINECTOMY/ DECOMPRESSION WITH MET-RX Right 05/05/2013   Procedure: Right Lumbar three-four Extraforaminal Microdiskectomy with Metrex;  Surgeon: [name], MD;  Location: [facility];  Service: Neurosurgery;  Laterality: Right;  Right Lumbar three-four Extraforaminal Microdiskectomy with Metrex  . LUNG BIOPSY Right 02/19/2018   Procedure: LUNG BIOPSY;  Surgeon: [name], MD;  Location: [place];  Service: Thoracic;  Laterality: Right;  . SHOULDER OPEN ROTATOR CUFF REPAIR Bilateral 2001  . TONSILLECTOMY    . VIDEO ASSISTED THORACOSCOPY Right 02/19/2018   Procedure: VIDEO ASSISTED THORACOSCOPY;   Surgeon: [name], MD;  Location: [place];  Service: Thoracic;  Laterality: Right;  Allergies  Allergen Reactions  . Xylocaine [Lidocaine] Anaphylaxis  . Codeine Nausea And Vomiting  . Statins Other (See Comments)    Muscle soreness and an aching feeling all over Myalgias  . Percocet [Oxycodone-Acetaminophen] Itching    Current Outpatient Medications  Medication Sig Dispense Refill  . acetaminophen (TYLENOL) 650 MG CR tablet Take 1,300 mg by mouth every 8 (eight) hours as needed for pain.    . budesonide-formoterol (SYMBICORT) 80-4.5 MCG/ACT inhaler Inhale 2 puffs into the lungs 2 (two) times daily.    . cetirizine (ZYRTEC) 10 MG tablet Take 10 mg by mouth daily as needed for allergies.     . Cholecalciferol (DIALYVITE VITAMIN D 5000) 125 MCG (5000 UT) capsule Take 5,000 Units by mouth daily.    . diclofenac Sodium (VOLTAREN) 1 % GEL Apply 1 application topically 4 (four) times daily as needed (pain).    Marland Kitchen diltiazem (CARDIZEM CD) 240 MG 24 hr capsule Take 1 capsule (240 mg total) by mouth daily. 90 capsule 3  . diphenoxylate-atropine (LOMOTIL) 2.5-0.025 MG tablet Take 1 tablet by mouth 4 (four) times daily as needed for diarrhea or loose stools.    Marland Kitchen ELIQUIS 5 MG TABS tablet TAKE ONE TABLET (5MG TOTAL) BY MOUTH TWODAILY (Patient taking differently: Take 5 mg by mouth 2 (two) times daily.) 180 tablet 1  . ezetimibe (ZETIA) 10 MG tablet TAKE ONE (1) TABLET BY MOUTH EVERY DAY 90 tablet 3  . fluticasone (FLONASE) 50 MCG/ACT nasal spray Place 2 sprays into both nostrils daily as needed for allergies or rhinitis.    Marland Kitchen gabapentin (NEURONTIN) 300 MG capsule Take 300 mg by mouth 3 (three) times daily.     . hydroxychloroquine (PLAQUENIL) 200 MG tablet Take 200 mg by mouth 2 (two) times daily.    . metoprolol tartrate (LOPRESSOR) 25 MG tablet TAKE ONE TABLET BY MOUTH TWICE A DAY (Patient taking differently: Take 25 mg by mouth 2 (two) times daily.) 180 tablet 3  . nystatin (MYCOSTATIN)  100000 UNIT/ML suspension Take 5 mLs (500,000 Units total) by mouth every other day. (Patient taking differently: Take 5 mLs by mouth daily as needed (thrush).) 437 mL 11  . ondansetron (ZOFRAN) 4 MG tablet Take 4 mg by mouth every 8 (eight) hours as needed for nausea or vomiting.     . predniSONE (DELTASONE) 5 MG tablet Take 5 mg by mouth daily.     Marland Kitchen spironolactone (ALDACTONE) 25 MG tablet Take 0.5 tablets (12.5 mg total) by mouth daily. 30 tablet 3  . tamsulosin (FLOMAX) 0.4 MG CAPS capsule Take 0.4 mg by mouth 2 (two) times daily.     . rosuvastatin (CRESTOR) 10 MG tablet Take 0.5 tablets (5 mg total) by mouth once a week. 20 tablet 0   No current facility-administered medications for this visit.    Social History   Socioeconomic History  . Marital status: Married    Spouse name: Not on file  . Number of children: Not on file  . Years of education: Not on file  . Highest education level: Not on file  Occupational History  . Not on file  Tobacco Use  . Smoking status: Never Smoker  . Smokeless tobacco: Never Used  Vaping Use  . Vaping Use: Never used  Substance and Sexual Activity  . Alcohol use: Yes    Alcohol/week: 21.0 standard drinks    Types: 21 Glasses of wine per week    Comment: 2/3 glasses wine in evening  . Drug use:  No  . Sexual activity: Yes    Birth control/protection: Other-see comments    Comment: old age  Other Topics Concern  . Not on file  Social History Narrative  . 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 He is married has 2 children. There is no tobacco use. He stays active. There is no alcohol use.  He is still working but had a significantly reduced pace than he had  previously.  Family history is notable that both parents are deceased.  Mother died of old age.  Father died but had a history of mitral valve prolapse.  He has 2 brothers and one sister who are  alive and well.  ROS General: Negative; No fevers, chills, or night sweats;  HEENT: Negative; No changes in vision or hearing, sinus congestion, difficulty swallowing Pulmonary:  shortness of breath with activity; undergoing evaluation for chronic fibrosing interstitial lung disease Cardiovascular: See history of present illness GI: Status post recent sigmoid colon perforation requiring emergent surgery with subsequent peritonitis and colostomy insertion. GU: Negative; No dysuria, hematuria, or difficulty voiding Musculoskeletal: Negative; no myalgias, joint pain, or weakness Hematologic/Oncology: Negative; no easy bruising, bleeding Endocrine: Negative; no heat/cold intolerance; no diabetes Neuro: Negative; no changes in balance, headaches Skin: Negative; No rashes or skin lesions Psychiatric: Negative; No behavioral problems, depression Sleep: Positive for OSA O on a home sleep study at [facility] pulmonary negative;  Other comprehensive 14 point system review is negative.  PE BP 112/64 (BP Location: Left Arm, Patient Position: Sitting, Cuff Size: Normal)   Pulse 70   Ht _0  (1.727 m)   Wt 188 lb 12.8 oz (85.6 kg)   SpO2 91%   BMI 28.71 kg/m   Repeat blood pressure by me was 124/70  Wt Readings from Last 3 Encounters:  09/28/20 188 lb 12.8 oz (85.6 kg)  07/29/20 192 lb (87.1 kg)  07/06/20 196 lb (88.9 kg)   General: Alert, oriented, no distress.  Skin: normal turgor, no rashes, warm and dry HEENT: Normocephalic, atraumatic. Pupils equal round and reactive to light; sclera anicteric; extraocular muscles intact;  Nose without nasal septal hypertrophy Mouth/Parynx benign; Mallinpatti scale 3 Neck: No JVD, no carotid bruits; normal carotid upstroke Lungs: clear to ausculatation and  percussion; no wheezing or rales Chest wall: without tenderness to palpitation Heart: PMI not displaced, RRR, s1 s2 normal, 1/6 systolic murmur, no diastolic murmur, no rubs, gallops, thrills, or heaves Abdomen: soft, nontender; no hepatosplenomehaly, BS+; abdominal aorta nontender and not dilated by palpation. Back: no CVA tenderness Pulses 2+ Musculoskeletal: full range of motion, normal strength, no joint deformities Extremities: no clubbing cyanosis or edema, Homan's sign negative  Neurologic: grossly nonfocal; Cranial nerves grossly wnl Psychologic: Normal mood and affect    General: Alert, oriented, no distress.  Skin: normal turgor, no rashes, warm and dry HEENT: Normocephalic, atraumatic. Pupils equal round and reactive to light; sclera anicteric; extraocular muscles intact;  Nose without nasal septal hypertrophy Mouth/Parynx benign; Mallinpatti scale 3 Neck: No JVD, no carotid bruits; normal carotid upstroke Lungs: clear to ausculatation and percussion; no wheezing or rales Chest wall: without tenderness to palpitation Heart: PMI not displaced, RRR, s1 s2 normal, 1/6 systolic  murmur, no diastolic murmur, no rubs, gallops, thrills, or heaves Abdomen: soft, nontender; no hepatosplenomehaly, BS+; abdominal aorta nontender and not dilated by palpation. Back: no CVA tenderness Pulses 2+ Musculoskeletal: full range of motion, normal strength, no joint deformities Extremities: no clubbing cyanosis or edema, Homan's sign negative  Neurologic: grossly nonfocal; Cranial nerves grossly wnl Psychologic: Normal mood and affect  ECG (independently read by me): NSR at 70 ; no ectopy, normal intervals  March 2022 ECG (independently read by me): NSR at 72; no ectopy    January 2022  ECG (independently read by me): Normal sinus rhythm at 87 bpm.  Mild RV conduction delay.  No ectopy.  Low voltage.  Normal intervals  June 2020 ECG (independently read by me): Normal sinus rhythm at 70 bpm.   Incomplete right bundle branch block.  QTc interval normal at 432 ms  06/25/2018 ECG (independently read by me): Normal sinus rhythm at 79 bpm.  QTc interval _0 ms.  05/12/2019 ECG (independently read by me): Normal sinus rhythm at 80 bpm.  No ectopy.  Normal intervals.  September 2018 ECG (independently read by me): Normal sinus rhythm at 65 bpm.  Isolated PAC.  Incomplete right bundle branch block.  Normal intervals.  March 2018 ECG (independently read by me): Normal sinus rhythm at 79 bpm.  QTc interval 460 ms.  PR interval normal at 158 ms.  November 2017 ECG (independently read by me): Normal sinus rhythm at 99 bpm, incomplete right bundle branch block.  Nondiagnostic ST-T changes.  January 2017 ECG (independently read by me): Normal sinus rhythm at 82 bpm.  Mild RV conduction delay.  Normal intervals.  No significant ST-T changes.  December 2015 ECG (independently read by me): Normal sinus rhythm at 74 bpm.  Mild RV conduction delay.  QTc interval 461 ms.  December 2014 ECG: Sinus rhythm at 57 beats per minute. No ectopy. Normal intervals.  LABS:  BMP Latest Ref Rng & Units 07/29/2020 11/06/2019 10/22/2019  Glucose 65 - 99 mg/dL 79 159(H) 101(H)  BUN 8 - 27 mg/dL _1 Creatinine 0.76 - 1.27 mg/dL 0.98 0.83 0.86  BUN/Creat Ratio 10 - 24 8(L) - -  Sodium 134 - 144 mmol/L 140 138 135  Potassium 3.5 - 5.2 mmol/L 3.9 4.0 4.0  Chloride 96 - 106 mmol/L 99 102 102  CO2 20 - 29 mmol/L _2 Calcium 8.6 - 10.2 mg/dL 9.2 9.2 9.0   Hepatic Function Latest Ref Rng & Units 10/22/2019 07/21/2019 01/15/2019  Total Protein 6.0 - 8.3 g/dL 6.3 6.1 5.9(L)  Albumin 3.5 - 5.2 g/dL 4.2 3.8 3.6  AST 0 - 37 U/L _3 ALT 0 - 53 U/L _4 Alk Phosphatase 39 - 117 U/L 56 52 55  Total Bilirubin 0.2 - 1.2 mg/dL 1.2 1.0 1.4(H)  Bilirubin, Direct 0.0 - 0.3 mg/dL 0.2 - -   CBC Latest Ref Rng & Units 11/06/2019 10/22/2019 01/29/2019  WBC 4.0 - 10.5 K/uL 8.8 7.7 11.6(H)  Hemoglobin 13.0 - 17.0  g/dL 16.9 16.2 13.4  Hematocrit 39.0 - 52.0 % 50.1 47.9 37.5(L)  Platelets 150 - 400 K/uL 210 213.0 180   Lab Results  Component Value Date   MCV 103.7 (H) 11/06/2019   MCV 104.5 (H) 10/22/2019   MCV 105.9 (H) 01/29/2019   Erythrocyte Sedimentation Rate     Component Value Date/Time   ESRSEDRATE 2 10/22/2019 1709   CRP November 2017: 139  Repeat  CRP 07/16/16:  6  Lab Results  Component Value Date   TSH 3.440 07/29/2020     Lab Results  Component Value Date   HGBA1C 6.9 (H) 02/17/2018     Lipid Panel     Component Value Date/Time   CHOL 222 (H) 07/16/2016 0902   TRIG 359 (H) 07/16/2016 0902   HDL 67 07/16/2016 0902   CHOLHDL 3.3 07/16/2016 0902   VLDL 72 (H) 07/16/2016 0902   LDLCALC 83 07/16/2016 0902   ------------------------------------------------------------------------------------------------------------- Oxygen Saturation: Rest: 92% with pulse of 93  Following walking in the office: 93% with a pulse of 106  03/21/2016 Nuclear Study Highlights    The left ventricular ejection fraction is normal (55-65%).  Nuclear stress EF: 61%.  The study is normal.  This is a low risk study.  There was no ST segment deviation noted during stress.     ------------------------------------------------------------------- 03/22/2016 ECHO Study Conclusions  - Left ventricle: The cavity size was normal. Wall thickness was   normal. Systolic function was normal. The estimated ejection   fraction was in the range of 60% to 65%. Wall motion was normal;   there were no regional wall motion abnormalities. Doppler   parameters are consistent with abnormal left ventricular   relaxation (grade 1 diastolic dysfunction). - Aortic valve: There was no stenosis. - Aorta: Mildly dilated aortic root. Aortic root dimension: 38 mm   (ED). - Mitral valve: There was no significant regurgitation. - Right ventricle: The cavity size was normal. Systolic function   was normal. -  Pulmonary arteries: No complete TR doppler jet so unable to   estimate PA systolic pressure. - Inferior vena cava: The vessel was normal in size. The   respirophasic diameter changes were in the normal range (>= 50%),   consistent with normal central venous pressure.  Impressions:  - Normal LV size with EF 60-65%. Normal RV size and systolic   function. No significant valvular abnormalities.  --------------------------------------------------------------------------------------------  04/02/2016 EXAM: CT CHEST WITHOUT CONTRAST  TECHNIQUE: Multidetector CT imaging of the chest was performed following the standard protocol without IV contrast.  COMPARISON:  None.  FINDINGS: Cardiovascular: The heart size is normal. No pericardial effusion. Atherosclerotic calcification is noted in the wall of the thoracic aorta.  Mediastinum/Nodes: No mediastinal lymphadenopathy. No evidence for gross hilar lymphadenopathy although assessment is limited by the lack of intravenous contrast on today's study. There is no axillary lymphadenopathy.  Lungs/Pleura: Fine detail obscured by breathing motion. Dependent linear and platelike atelectasis noted in the lower lungs bilaterally. Components of associated parenchymal scarring not excluded. No evidence for pulmonary edema. No dense focal airspace consolidation. No evidence for pleural effusion.  Upper Abdomen: The liver shows diffusely decreased attenuation suggesting steatosis. Gallbladder surgically absent.  Musculoskeletal: Degenerative changes noted in the shoulders bilaterally.  IMPRESSION: Relatively diffuse atelectatic changes in the lungs bilaterally. Study degraded by patient breathing motion.  No specific findings to explain the patient's history of shortness of breath.   06/13/2016 CT Chest High Resolution  FINDINGS: Cardiovascular: Atherosclerotic calcification of the arterial vasculature. Heart size normal. No  pericardial effusion.  Mediastinum/Nodes: No pathologically enlarged mediastinal or axillary lymph nodes. Hilar regions are difficult to evaluate without IV contrast. Esophagus is grossly unremarkable.  Lungs/Pleura: Mild basilar predominant subpleural reticulation and ground-glass with minimal architectural distortion. No definite traction bronchiectasis/bronchiolectasis or honeycombing. Findings are likely unchanged from 04/02/2016 but there was a great deal of respiratory motion on that exam. There may be minimal air trapping. No  pleural fluid. Airway is unremarkable.  Upper Abdomen: Visualized portions of the liver, adrenal glands and right kidney are grossly unremarkable. Punctate stone in the left kidney. Visualized portions of the spleen, pancreas, stomach and bowel are grossly unremarkable. Cholecystectomy. No upper abdominal adenopathy.  Musculoskeletal: No worrisome lytic or sclerotic lesions. Degenerative changes are seen in the spine.  IMPRESSION: 1. Mild basilar predominant subpleural reticulation and ground-glass, suggesting nonspecific interstitial pneumonitis. Usual interstitial pneumonitis cannot be excluded on this baseline examination. 2.  Aortic atherosclerosis (ICD10-170.0). 3. Punctate stone left kidney.   IMPRESSION:  No diagnosis found.  ASSESSMENT AND PLAN: 1.  CAD: Mild mid systolic LAD bridging noted at catheterization in 2001.  He is not having any anginal symptomatology.    2 PAF:   He is to maintain sinus rhythm on his regimen now of diltiazem 240 mg daily and metoprolol tartrate 25 mg twice a day.  He is anticoagulated on Eliquis 5 mg twice a day  3. Eliquis for anticoagulation.  He is tolerating this well without bleeding.  In the future he may require neurovascular surgery at which time Eliquis will need to be held for minimum of 72 hours   4.  Exertional dyspnea and leg swelling: Blood pressure today is stable.  At his last office  visit spironolactone 12.5 mg was added to his regimen with improvement in leg edema.  He continues to be on diltiazem 240 mg metoprolol tartrate 25 mg twice a day.  I reviewed his most recent echo Doppler study which shows normal systolic function with EF at 55 to 60% and grade 1 diastolic dysfunction.  5.  Fibrosing nonspecific interstitial pneumonitis: He was initially followed by [name] and now sees Dr. Chase Caller.  Recent PFTs had shown slight reduction from previous evaluation.  He did not tolerateOfev due to severe nausea and vomiting.  6.  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  7.  Mixed hyperlipidemia: Currently, he is on Zetia 10 mg, rosuvastatin 5 mg once a week and had been on omega-3 fatty acids.  Dr. Delphina Cahill has been following laboratory.  Most recent lipid studies by Dr. [place] Crane continue to show total cholesterol 230 triglycerides 226, HDL 65 and LDL 126.  LDL had improved from 156.  May benefit with the addition of Vascepa and try slight titration of rosuvastatin to 10 mg once a week if he is able to tolerate.  8.  History of alpha gal deficiency: Stabilized  9.  OSA on [place] home sleep study study July 2018.  AHI 27.4 with oxygen desaturation to a nadir of 81%; followed by [facility].  I will see him in 6 months for reevaluation.  Troy Sine, MD, [facility]  09/28/2020 4:56 PM

## 2020-10-05 ENCOUNTER — Encounter: Payer: Self-pay | Admitting: Cardiovascular Disease

## 2020-10-11 DIAGNOSIS — B353 Tinea pedis: Secondary | ICD-10-CM | POA: Diagnosis not present

## 2020-10-11 DIAGNOSIS — Z1283 Encounter for screening for malignant neoplasm of skin: Secondary | ICD-10-CM | POA: Diagnosis not present

## 2020-10-11 DIAGNOSIS — D225 Melanocytic nevi of trunk: Secondary | ICD-10-CM | POA: Diagnosis not present

## 2020-10-11 DIAGNOSIS — X32XXXD Exposure to sunlight, subsequent encounter: Secondary | ICD-10-CM | POA: Diagnosis not present

## 2020-10-11 DIAGNOSIS — L570 Actinic keratosis: Secondary | ICD-10-CM | POA: Diagnosis not present

## 2020-10-31 DIAGNOSIS — E1165 Type 2 diabetes mellitus with hyperglycemia: Secondary | ICD-10-CM | POA: Diagnosis not present

## 2020-10-31 DIAGNOSIS — I2510 Atherosclerotic heart disease of native coronary artery without angina pectoris: Secondary | ICD-10-CM | POA: Diagnosis not present

## 2020-11-22 DIAGNOSIS — S32010A Wedge compression fracture of first lumbar vertebra, initial encounter for closed fracture: Secondary | ICD-10-CM | POA: Diagnosis not present

## 2020-11-22 DIAGNOSIS — S32010G Wedge compression fracture of first lumbar vertebra, subsequent encounter for fracture with delayed healing: Secondary | ICD-10-CM | POA: Diagnosis not present

## 2020-11-23 DIAGNOSIS — M4722 Other spondylosis with radiculopathy, cervical region: Secondary | ICD-10-CM | POA: Diagnosis not present

## 2020-11-23 DIAGNOSIS — M5126 Other intervertebral disc displacement, lumbar region: Secondary | ICD-10-CM | POA: Diagnosis not present

## 2020-11-23 DIAGNOSIS — S32010G Wedge compression fracture of first lumbar vertebra, subsequent encounter for fracture with delayed healing: Secondary | ICD-10-CM | POA: Diagnosis not present

## 2020-11-23 DIAGNOSIS — M542 Cervicalgia: Secondary | ICD-10-CM | POA: Diagnosis not present

## 2020-11-29 ENCOUNTER — Telehealth: Payer: Self-pay | Admitting: *Deleted

## 2020-11-29 ENCOUNTER — Other Ambulatory Visit: Payer: Self-pay | Admitting: Neurological Surgery

## 2020-11-29 NOTE — Telephone Encounter (Signed)
   [facility] Pre-operative Risk Assessment    Patient Name: [name]  DOB: 09/04/1945 MRN: [id number]   Request for surgical clearance:  What type of surgery is being performed? L1-2 Posterior lumbar interbody fusion with T10 to L2 stabilization  When is this surgery scheduled? 12/22/20  What type of clearance is required (medical clearance vs. Pharmacy clearance to hold med vs. Both)? both  Are there any medications that need to be held prior to surgery and how long? Eliquis   Practice name and name of physician performing surgery? [facility] Dr. Ellene Route  What is the office phone number? [phone]   7.   What is the office fax number? [phone]  8.   Anesthesia type (None, local, MAC, general) ? general   Hodan Wurtz A Elisabetta Mishra 11/29/2020, 2:33 PM  _________________________________________________________________   (provider comments below)

## 2020-11-30 ENCOUNTER — Other Ambulatory Visit (HOSPITAL_COMMUNITY): Payer: Self-pay | Admitting: Neurological Surgery

## 2020-11-30 DIAGNOSIS — S32010G Wedge compression fracture of first lumbar vertebra, subsequent encounter for fracture with delayed healing: Secondary | ICD-10-CM

## 2020-11-30 NOTE — Telephone Encounter (Signed)
   Name: Otis Portal  DOB: [birth year]  MRN: [id number]   Primary Cardiologist: Shelva Majestic, MD  Chart reviewed as part of pre-operative protocol coverage.   Patient was contacted 11/30/2020 in reference to pre-operative risk assessment for pending surgery as outlined below.  Anchor Dwan Chernick was last seen on 09/28/20 by Dr. Claiborne Billings.  Since that day, Harshaan Whang has done well. He reports today that he has DOE when exerting himself, though stable from previous visits. If going too quickly up stairs, he has DOE, which has been unchanged for some time. No CP. His functional capacity is poor but unchanged from previous visits. RCRI calculated risk is moderate at 6.6% risk of MACE. After review of his chart, and based on ACC/AHA guidelines, the patient would be at acceptable risk for the planned procedure without further cardiovascular testing.   The patient was advised that if he develops new symptoms prior to surgery to contact our office to arrange for a follow-up visit, and he verbalized understanding.  He understands the pharmacy recommendation to hold Eliquis for 3 days prior to the procedure with restart per his surgeon. He does not require bridging with Lovenox around the procedure.  I will route this recommendation to the requesting party via Epic fax function and remove from pre-op pool. Please call with questions.  Arvil Chaco, PA-C 11/30/2020, 3:25 PM

## 2020-11-30 NOTE — Telephone Encounter (Signed)
Patient with diagnosis of atrial fibrillation on Eliquis for anticoagulation.    Procedure: L1-2 posterior lumbar interbody fustion with T10 to L2 stabilization Date of procedure: 12/22/20   CHA2DS2-VASc Score = 2  This indicates a 2.2% annual risk of stroke. The patient's score is based upon: CHF History: No HTN History: Yes Diabetes History: No Stroke History: No Vascular Disease History: No Age Score: 1 Gender Score: 0   CrCl 81.3 Platelet count 222  Per office protocol, patient can hold Eliquis for 3 days prior to procedure.   Patient will not need bridging with Lovenox (enoxaparin) around procedure.

## 2020-12-06 DIAGNOSIS — L570 Actinic keratosis: Secondary | ICD-10-CM | POA: Diagnosis not present

## 2020-12-06 DIAGNOSIS — X32XXXD Exposure to sunlight, subsequent encounter: Secondary | ICD-10-CM | POA: Diagnosis not present

## 2020-12-06 DIAGNOSIS — L308 Other specified dermatitis: Secondary | ICD-10-CM | POA: Diagnosis not present

## 2020-12-06 DIAGNOSIS — L820 Inflamed seborrheic keratosis: Secondary | ICD-10-CM | POA: Diagnosis not present

## 2020-12-08 ENCOUNTER — Encounter: Payer: Self-pay | Admitting: Primary Care

## 2020-12-08 ENCOUNTER — Other Ambulatory Visit: Payer: Self-pay

## 2020-12-08 ENCOUNTER — Ambulatory Visit: Payer: PPO | Admitting: Primary Care

## 2020-12-08 VITALS — BP 128/70 | HR 69 | Temp 98.5°F | Ht 68.0 in | Wt 186.8 lb

## 2020-12-08 DIAGNOSIS — J849 Interstitial pulmonary disease, unspecified: Secondary | ICD-10-CM

## 2020-12-08 DIAGNOSIS — Z01811 Encounter for preprocedural respiratory examination: Secondary | ICD-10-CM | POA: Diagnosis not present

## 2020-12-08 NOTE — Patient Instructions (Addendum)
You are considered low-intermediate risk for prolonged mechanical ventilation and in-hospital post-op pulmonary complications mostly due to length of procedure but also history of ILD   Encourage early ambulation after surgery as soon as you are cleared to do so, wear compression stockings and use Incentive spirometer every hour while awake for several days after surgery   Hold anticoagulation per surgeon   Orders: Spirometry with DLCO   Follow-up: 2-3 months with Dr. Chase Caller Spirometry with DLCO

## 2020-12-08 NOTE — Progress Notes (Signed)
$'@Patient'H$  ID: [name], male    DOB: [birth year], 75 y.o.   MRN: [id number]  Chief Complaint  Patient presents with   Follow-up    Surgical clearance-Aug. 11th back surgery. Sob same    Referring provider: [name], MD  HPI: 75 year old male, never smoked.  Past medical history significant for interstitial lung disease, obstructive sleep apnea, hypertension, proximal A. Fib.  Patient of Dr. Chase Caller, last seen in office on 07/06/2020.  Previous [facility] pulmonry encounter:  07/06/2020 -   Chief Complaint  Patient presents with   Follow-up    PFT performed today.  Pt states he feels like he might have declined some since last visit. Pt states his breathing is some worse. Denies any complaints of cough.   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  - in 2019 : considered progressive pre-bx (on PFT/CT per notes)  - in 2019 - clinical CTD ruled out by Dr. Esmeralda Arthur record review_  -High-resolution CT March 2021: Indeterminate for UIP without change since 2017  RX  - Mid-endJan 2020 -s tarted cellcept/bactrim + ofev (course complicated early February 2020 by diverticulitis and intermittent side effects with stop start of the nintedanib)  - stoppe cellcept June 2021 due to diarrhea/immune suppressino  - off ofev aug 2021 due to severe diarrhea  Pathological tissue mild emphysema present on surgical lung biopsy October 2019 [not evident on pulmonary  function testing]  - Alpha 1 - MM:  Oct 2019  -On inhaler therapy  -No obstruction seen on PFT or emphysema on CT   History of perforated sigmoid diverticulitis early February 2021 few weeks after starting nintedanib/CellCept  -Status post reversal of colostomy in September 2020   HPI [name] 75 y.o. -returns for follow-up for his ILD.  He presents with his wife.  He is on supportive care.  He quit taking his CellCept and nintedanib in the summer 2021 following diarrhea in the setting of GI surgery and colostomy reversal.  He does not want to take these drugs anymore.  However he tells me that he is feeling more short of breath in the last 4 months.  Present on exertion relieved by rest.  His symptom score reflect worsening shortness of breath.  He continues to have some amount of diarrhea because of reversal of his colostomy.  He had a high-resolution CT chest that shows stability in ILD per the radiologist since 2017.  His walking desaturation test is stable.  However his pulmonary function test shows confusing signal compared to 6 months or 1 year ago.  Is stable versus normal variation versus slight decline.  I personally visualized the images of the pulmonary function test and the grph with him.  He is open to getting his pulmonary pathology slides sent for third opinion at an outside institution.  We discussed antifibrotic's in case his fibrosis is getting worse.  He is definitely not interested in the other option of pirfenidone after hearing side effects of nausea,  vomiting, weight loss, fatigue and possible diarrhea.  He is interested in drugs that may not have GI side effects.  These are available in clinical trials.  For which she will need an IPF diagnosis.  Therefore is interested in getting another opinion on the slides to see if you would qualify for trials in the future.  His wife does indicate that he did have an episode of chest pain many went outdoors a few months ago  and was climbing a hill.  In fact the chest pain was so bad it was central that he had to come back.  They have not reported this to the cardiologist.  He has cardiology follow-up coming up with Dr. Claiborne Billings in May 2022.  He had normal stress test or low risk in 2017.  His echo was normal in May 2021.   Plan -We discussed about starting the other antifibrotic pirfenidone but given your history of GI side effects with nintedanib I respect your desire to refuse but continue with expectant follow-up   -We will send a message to Dr. Shelva Majestic to evaluate you for angina   -Once Dr. Claiborne Billings clears you I think you should join pulmonary rehabilitation   -I have sent a note to our pathologist to get another opinion from another institution on your lung pathology to see if you have undifferentiated connective tissue disease or UIP/IPF.  If you have UIP/IPF you might qualify for clinical trials with drugs that do not have GI side effects   Follow-up -Do spirometry and DLCO in 3 months -Return to see Dr. Chase Caller in 3 months or sooner if needed - 30 min visit    12/08/2020- interim hx Patient presents today for surgical risk assessment. Patient is scheduled for thoracic-lumbar fusion with Dr. Ellene Route on August 11th. He is doing well today with no acute complaints. He has had no recent respiratory infection. He is independent and does not wear oxygen. He is not currently on anti-fibrotic's as he did not tolerate OFEV d/t GI symptoms. He is on Plaquenil. Monitoring clinically. He needs repeat Spirometry in the next 3 months.    SYMPTOM SCALE - ILD 10/22/2019  12/30/2019  07/06/2020  12/08/2020   O2 use ra ra ra RA  Shortness of Breath 0 -> 5 scale with 5 being worst (score 6 If unable to do)   Off OFEV  At rest 1 1 0 0  Simple tasks - showers, clothes change, eating, shaving '3 1 2 3  '$ Household (dishes, doing bed, laundry) '3 2 2 2  '$ Shopping '3 1 2 2  '$ Walking level at own pace '4 2 3 3  '$ Walking up Stairs '5  4 5 5  '$ Total (30-36) Dyspnea Score '19 11 14 15  '$ How bad is your cough? 0 0 0 0  How bad is your fatigue yes '3 2 1  '$ How bad is nausea yes 3 ofev 1 1  How bad is vomiting?  n 0 0 0  How bad is diarrhea? no 3 ofev 1 - baseline from GI surgery 1  How bad is anxiety? x 1 0 0  How bad is depression x 1 0 0     Allergies  Allergen Reactions   Xylocaine [Lidocaine] Anaphylaxis    Injection form   Codeine Nausea And Vomiting   Statins Other (See Comments)    Muscle soreness and an aching feeling all over Myalgias   Aldactone [Spironolactone]     Sore nipples and chest  Percocet [Oxycodone-Acetaminophen] Itching    Immunization History  Administered Date(s) Administered   Influenza Split 03/20/2016   Influenza, High Dose Seasonal PF 01/29/2018, 02/25/2019, 03/20/2019, 12/13/2019   Influenza,inj,Quad PF,6+ Mos 03/12/2017   Moderna Sars-Covid-2 Vaccination 06/04/2019, 07/10/2019, 03/18/2020   Pneumococcal Conjugate-13 01/10/2018    Past Medical History:  Diagnosis Date   Allergy to alpha-gal    2015; has been able to re-introduce red meat for the past 2 1/2 years without reaction (as of 01/21/19)   Arthritis    Chest pain    a. 03/2017: echo showing EF of 60-65%, no regional WMA or significant valve abnormalities. b. 03/2016: NST with no evidence of ischemia.    Complication of anesthesia    "anaphylactic" reaction after given xylocaine for shoulder injection > 10 years ago; reported no known reaction when given marcaine (as of 01/21/19)    Digestive problems    on Prednisone prn for this issue- diarrhea   Dyspnea    with activity   Dysrhythmia    irregular due to Myocarditis- takes Atenolol, Norvasc   GERD (gastroesophageal reflux disease)    H/O viral myocarditis    25 years ago   Head injury, closed, with concussion    Breif LOC   Hyperlipidemia    Mild mitral valve prolapse    per Dr Evette Georges notes   PAF (paroxysmal atrial fibrillation) ([place])    a. initially occuring in  02/2016. b. recurrent in 07/2016. Placed on Eliquis   Polymyalgia rheumatica (HCC)    Pre-diabetes    Pulmonary fibrosis (HCC)    Sleep apnea    mild sleep apnea   no CPAP    Tobacco History: Social History   Tobacco Use  Smoking Status Never  Smokeless Tobacco Never   Counseling given: Not Answered   Outpatient Medications Prior to Visit  Medication Sig Dispense Refill   acetaminophen (TYLENOL) 650 MG CR tablet Take 1,300 mg by mouth every 8 (eight) hours as needed for pain.     cetirizine (ZYRTEC) 10 MG tablet Take 10 mg by mouth daily as needed for allergies.      Cholecalciferol (DIALYVITE VITAMIN D 5000) 125 MCG (5000 UT) capsule Take 5,000 Units by mouth daily.     diltiazem (CARDIZEM CD) 240 MG 24 hr capsule Take 1 capsule (240 mg total) by mouth daily. 90 capsule 3   diphenoxylate-atropine (LOMOTIL) 2.5-0.025 MG tablet Take 1 tablet by mouth 4 (four) times daily as needed for diarrhea or loose stools.     ELIQUIS 5 MG TABS tablet TAKE ONE TABLET ('5MG'$  TOTAL) BY MOUTH TWODAILY (Patient taking differently: Take 5 mg by mouth 2 (two) times daily.) 180 tablet 1   ezetimibe (ZETIA) 10 MG tablet TAKE ONE (1) TABLET BY MOUTH EVERY DAY (Patient taking differently: Take 10 mg by mouth daily.) 90 tablet 3   fluticasone (FLONASE) 50 MCG/ACT nasal spray Place 2 sprays into both nostrils daily as needed for allergies or rhinitis.     gabapentin (NEURONTIN) 300 MG capsule Take 300 mg by mouth 2 (two) times daily.     hydroxychloroquine (PLAQUENIL) 200 MG tablet Take 200 mg by mouth 2 (two) times daily.     metoprolol tartrate (LOPRESSOR) 25 MG tablet TAKE ONE TABLET BY MOUTH TWICE A DAY (Patient taking differently: Take 25 mg by mouth 2 (two) times daily.) 180 tablet 3   ondansetron (ZOFRAN) 4 MG tablet Take 4 mg by mouth every 8 (eight) hours as needed for nausea or vomiting.  predniSONE (DELTASONE) 5 MG tablet Take 5 mg by mouth daily.      rosuvastatin (CRESTOR) 10 MG tablet Take 0.5  tablets (5 mg total) by mouth once a week. 20 tablet 0   tamsulosin (FLOMAX) 0.4 MG CAPS capsule Take 0.4 mg by mouth 2 (two) times daily.      Trolamine Salicylate (ASPERCREME EX) Apply 1 application topically daily as needed (arthritis pain).     budesonide-formoterol (SYMBICORT) 80-4.5 MCG/ACT inhaler Inhale 2 puffs into the lungs 2 (two) times daily as needed (shortness of breath).     nystatin (MYCOSTATIN) 100000 UNIT/ML suspension Take 5 mLs (500,000 Units total) by mouth every other day. (Patient not taking: No sig reported) 437 mL 11   spironolactone (ALDACTONE) 25 MG tablet Take 0.5 tablets (12.5 mg total) by mouth daily. (Patient not taking: No sig reported) 30 tablet 3   diclofenac Sodium (VOLTAREN) 1 % GEL Apply 1 application topically 4 (four) times daily as needed (pain). (Patient not taking: Reported on 12/08/2020)     No facility-administered medications prior to visit.    Review of Systems  Review of Systems  Constitutional: Negative.   HENT: Negative.    Respiratory: Negative.    Cardiovascular: Negative.     Physical Exam  BP 128/70 (BP Location: Left Arm, Cuff Size: Normal)   Pulse 69   Temp 98.5 F (36.9 C) (Temporal)   Ht '5\' 8"'$  (1.727 m)   Wt 186 lb 12.8 oz (84.7 kg)   SpO2 94%   BMI 28.40 kg/m  Physical Exam Constitutional:      Appearance: Normal appearance.  HENT:     Head: Normocephalic and atraumatic.  Cardiovascular:     Rate and Rhythm: Normal rate and regular rhythm.  Pulmonary:     Effort: Pulmonary effort is normal.     Breath sounds: Normal breath sounds. No wheezing or rales.  Skin:    General: Skin is warm and dry.  Neurological:     General: No focal deficit present.     Mental Status: He is alert and oriented to person, place, and time. Mental status is at baseline.  Psychiatric:        Mood and Affect: Mood normal.        Thought Content: Thought content normal.     Lab Results:  CBC    Component Value Date/Time   WBC 8.8  11/06/2019 1400   RBC 4.83 11/06/2019 1400   HGB 16.9 11/06/2019 1400   HCT 50.1 11/06/2019 1400   PLT 210 11/06/2019 1400   MCV 103.7 (H) 11/06/2019 1400   MCH 35.0 (H) 11/06/2019 1400   MCHC 33.7 11/06/2019 1400   RDW 12.5 11/06/2019 1400   LYMPHSABS 2.4 10/22/2019 1709   MONOABS 0.7 10/22/2019 1709   EOSABS 0.0 10/22/2019 1709   BASOSABS 0.1 10/22/2019 1709    BMET    Component Value Date/Time   NA 140 07/29/2020 1039   K 3.9 07/29/2020 1039   CL 99 07/29/2020 1039   CO2 24 07/29/2020 1039   GLUCOSE 79 07/29/2020 1039   GLUCOSE 159 (H) 11/06/2019 1400   BUN 8 07/29/2020 1039   CREATININE 0.98 07/29/2020 1039   CREATININE 0.95 07/16/2016 0902   CALCIUM 9.2 07/29/2020 1039   GFRNONAA >60 11/06/2019 1400   GFRAA >60 11/06/2019 1400    BNP    Component Value Date/Time   BNP 43.3 07/29/2020 1039   BNP 53.0 04/02/2016 1435    ProBNP  Component Value Date/Time   PROBNP 62.0 08/27/2019 1620    Imaging: No results found.   Assessment & Plan:   Pre-operative respiratory examination From a pulmonary standpoint, this patient is optimized for Thoracic spine surgery in August. He is considered a low-intermediate risk for prolonged mechanical ventilation and for post op pulmonary complications. The patient is independent. Vitals are stable, exam was benign. He is not on oxygen. No recent respiratory infections. Ultimate clearance will be decided upon by the surgeon and anesthesiologist.   ILD (interstitial lung disease) ([place]) - Patient did not tolerate anti-fibrotics d/t GI side effects, he is currently maintained on Plaquenil. We are monitoring him clinically. He needs repeat Spirometry in the next 3 months.     1) RISK FOR PROLONGED MECHANICAL VENTILAION - > 48h  1A) Arozullah - Prolonged mech ventilation risk Arozullah Postperative Pulmonary Risk Score - for mech ventilation dependence >48h [employer], Ann Surg 2000, major non-cardiac surgery) Comment  Score  Type of surgery - abd ao aneurysm (27), thoracic (21), neurosurgery / upper abdominal / vascular (21), neck (11) Thoracic spine fusion  21  Emergency Surgery - (11)  0  ALbumin < 3 or poor nutritional state - (9)  0  BUN > 30 -  (8)  0  Partial or completely dependent functional status - (7)  0  COPD -  (6)  0  Age - 60 to 69 (4), > 70  (6)  6  TOTAL  21  Risk Stratifcation scores  - < 10 (0.5%), 11-19 (1.8%), 20-27 (4.2%), 28-40 (10.1%), >40 (26.6%)  4.2% risk prolonged mech ventilation       1B) GUPTA - Prolonged Mech Vent Risk Score source Risk  Guptal post op prolonged mech ventilation > 48h or reintubation < 30 days - ACS 2007-2008 dataset - http://lewis-perez.info/ 0.4 % Risk of mechanical ventilation for >48 hrs after surgery, or unplanned intubation ?30 days of surgery    2) RISK FOR POST OP PNEUMONIA Score source Risk  Lyndel Safe - Post Op Pnemounia risk  [email or web address] 0.3 % Risk of postoperative pneumonia    R3) ISK FOR ANY POST-OP PULMONARY COMPLICATION Score source Risk  CANET/ARISCAT Score - risk for ANY/ALl pulmonary complications - > risk of in-hospital post-op pulmonary complications (composite including respiratory failure, respiratory infection, pleural effusion, atelectasis, pneumothorax, bronchospasm, aspiration pneumonitis) [employer] - based on age, anemia, pulse ox, resp infection prior 30d, incision site, duration of surgery, and emergency v elective surgery Intermediate risk 13.3% risk of in-hospital post-op pulmonary complications (composite including respiratory failure, respiratory infection, pleural effusion, atelectasis, pneumothorax, bronchospasm, aspiration pneumonitis)     Martyn Ehrich, NP 12/13/2020

## 2020-12-12 NOTE — Progress Notes (Addendum)
Surgical Instructions    Your procedure is scheduled on 12/22/20.  Report to [facility] Main Entrance "A" at 5:30 A.M., then check in with the Admitting office.  Call this number if you have problems the morning of surgery:  [phone]   If you have any questions prior to your surgery date call [phone]: Open Monday-Friday 8am-4pm    Remember:  Do not eat after midnight the night before your surgery  You may drink clear liquids until 4:30 the morning of your surgery.   Clear liquids allowed are: Water, Non-Citrus Juices (without pulp), Carbonated Beverages, Clear Tea, Black Coffee Only, and Gatorade    Take these medicines the morning of surgery with A SIP OF WATER: diltiazem (CARDIZEM CD)  ezetimibe (ZETIA) gabapentin (NEURONTIN) hydroxychloroquine (PLAQUENIL) metoprolol tartrate (LOPRESSOR) predniSONE (DELTASONE) tamsulosin (FLOMAX)   IF Needed: acetaminophen (TYLENOL) budesonide-formoterol (SYMBICORT) cetirizine (ZYRTEC) fluticasone (FLONASE)  ondansetron (ZOFRAN)    As of today, STOP taking any Aspirin (unless otherwise instructed by your surgeon) Aleve, Naproxen, Ibuprofen, Motrin, Advil, Goody's, BC's, all herbal medications, fish oil, and all vitamins.  Hold Eliquis 3 days prior to surgery.   HOW TO MANAGE YOUR DIABETES BEFORE AND AFTER SURGERY  Why is it important to control my blood sugar before and after surgery? Improving blood sugar levels before and after surgery helps healing and can limit problems. A way of improving blood sugar control is eating a healthy diet by:  Eating less sugar and carbohydrates  Increasing activity/exercise  Talking with your doctor about reaching your blood sugar goals High blood sugars (greater than 180 mg/dL) can raise your risk of infections and slow your recovery, so you will need to focus on controlling your diabetes during the weeks before surgery. Make sure that the doctor who takes care of your diabetes knows about  your planned surgery including the date and location.  How do I manage my blood sugar before surgery? Check your blood sugar at least 4 times a day, starting 2 days before surgery, to make sure that the level is not too high or low.  Check your blood sugar the morning of your surgery when you wake up and every 2 hours until you get to the Short Stay unit.  If your blood sugar is less than 70 mg/dL, you will need to treat for low blood sugar: Do not take insulin. Treat a low blood sugar (less than 70 mg/dL) with  cup of clear juice (cranberry or apple), 4 glucose tablets, OR glucose gel. Recheck blood sugar in 15 minutes after treatment (to make sure it is greater than 70 mg/dL). If your blood sugar is not greater than 70 mg/dL on recheck, call [phone] for further instructions. Report your blood sugar to the short stay nurse when you get to Short Stay.  If you are admitted to the hospital after surgery: Your blood sugar will be checked by the staff and you will probably be given insulin after surgery (instead of oral diabetes medicines) to make sure you have good blood sugar levels. The goal for blood sugar control after surgery is 80-180 mg/dL.           Do not wear jewelry  Do not wear lotions, powders, colognes, or deodorant. Do not shave 48 hours prior to surgery.  Men may shave face and neck. Do not bring valuables to the hospital.              [facility] is not responsible for any belongings or valuables.  Do NOT Smoke (Tobacco/Vaping) or drink Alcohol 24 hours prior to your procedure If you use a CPAP at night, you may bring all equipment for your overnight stay.   Contacts, glasses, dentures or bridgework may not be worn into surgery, please bring cases for these belongings   For patients admitted to the hospital, discharge time will be determined by your treatment team.   Patients discharged the day of surgery will not be allowed to drive home, and someone needs to stay  with them for 24 hours.  ONLY 1 SUPPORT PERSON MAY BE PRESENT WHILE YOU ARE IN SURGERY. IF YOU ARE TO BE ADMITTED ONCE YOU ARE IN YOUR ROOM YOU WILL BE ALLOWED TWO (2) VISITORS.  Minor children may have two parents present. Special consideration for safety and communication needs will be reviewed on a case by case basis.  Special instructions:    Oral Hygiene is also important to reduce your risk of infection.  Remember - BRUSH YOUR TEETH THE MORNING OF SURGERY WITH YOUR REGULAR TOOTHPASTE   [place]- Preparing For Surgery  Before surgery, you can play an important role. Because skin is not sterile, your skin needs to be as free of germs as possible. You can reduce the number of germs on your skin by washing with CHG (chlorahexidine gluconate) Soap before surgery.  CHG is an antiseptic cleaner which kills germs and bonds with the skin to continue killing germs even after washing.     Please do not use if you have an allergy to CHG or antibacterial soaps. If your skin becomes reddened/irritated stop using the CHG.  Do not shave (including legs and underarms) for at least 48 hours prior to first CHG shower. It is OK to shave your face.  Please follow these instructions carefully.     Shower the NIGHT BEFORE SURGERY and the MORNING OF SURGERY with CHG Soap.   If you chose to wash your hair, wash your hair first as usual with your normal shampoo. After you shampoo, rinse your hair and body thoroughly to remove the shampoo.  Then [employer] and genitals (private parts) with your normal soap and rinse thoroughly to remove soap.  After that Use CHG Soap as you would any other liquid soap. You can apply CHG directly to the skin and wash gently with a scrungie or a clean washcloth.   Apply the CHG Soap to your body ONLY FROM THE NECK DOWN.  Do not use on open wounds or open sores. Avoid contact with your eyes, ears, mouth and genitals (private parts). Wash Face and genitals (private parts)  with  your normal soap.   Wash thoroughly, paying special attention to the area where your surgery will be performed.  Thoroughly rinse your body with warm water from the neck down.  DO NOT shower/wash with your normal soap after using and rinsing off the CHG Soap.  Pat yourself dry with a CLEAN TOWEL.  Wear CLEAN PAJAMAS to bed the night before surgery  Place CLEAN SHEETS on your bed the night before your surgery  DO NOT SLEEP WITH PETS.   Day of Surgery:  Take a shower with CHG soap. Wear Clean/Comfortable clothing the morning of surgery Do not apply any deodorants/lotions.   Remember to brush your teeth WITH YOUR REGULAR TOOTHPASTE.   Please read over the following fact sheets that you were given.

## 2020-12-13 ENCOUNTER — Encounter (HOSPITAL_COMMUNITY): Payer: Self-pay

## 2020-12-13 ENCOUNTER — Encounter (HOSPITAL_COMMUNITY)
Admission: RE | Admit: 2020-12-13 | Discharge: 2020-12-13 | Disposition: A | Discharge status: Home / Self Care | Payer: PPO | Source: Ambulatory Visit | Attending: Neurological Surgery | Admitting: Neurological Surgery

## 2020-12-13 ENCOUNTER — Other Ambulatory Visit: Payer: Self-pay

## 2020-12-13 DIAGNOSIS — Z7901 Long term (current) use of anticoagulants: Secondary | ICD-10-CM | POA: Diagnosis not present

## 2020-12-13 DIAGNOSIS — R7303 Prediabetes: Secondary | ICD-10-CM | POA: Diagnosis not present

## 2020-12-13 DIAGNOSIS — X58XXXA Exposure to other specified factors, initial encounter: Secondary | ICD-10-CM | POA: Diagnosis not present

## 2020-12-13 DIAGNOSIS — I480 Paroxysmal atrial fibrillation: Secondary | ICD-10-CM | POA: Insufficient documentation

## 2020-12-13 DIAGNOSIS — Z01812 Encounter for preprocedural laboratory examination: Secondary | ICD-10-CM | POA: Insufficient documentation

## 2020-12-13 DIAGNOSIS — S32000A Wedge compression fracture of unspecified lumbar vertebra, initial encounter for closed fracture: Secondary | ICD-10-CM | POA: Insufficient documentation

## 2020-12-13 DIAGNOSIS — Z79899 Other long term (current) drug therapy: Secondary | ICD-10-CM | POA: Insufficient documentation

## 2020-12-13 DIAGNOSIS — G4733 Obstructive sleep apnea (adult) (pediatric): Secondary | ICD-10-CM | POA: Insufficient documentation

## 2020-12-13 DIAGNOSIS — Z01811 Encounter for preprocedural respiratory examination: Secondary | ICD-10-CM | POA: Insufficient documentation

## 2020-12-13 LAB — CBC
HCT: 47.3 % (ref 39.0–52.0)
Hemoglobin: 15.8 g/dL (ref 13.0–17.0)
MCH: 35 pg — ABNORMAL HIGH (ref 26.0–34.0)
MCHC: 33.4 g/dL (ref 30.0–36.0)
MCV: 104.6 fL — ABNORMAL HIGH (ref 80.0–100.0)
Platelets: 223 K/uL (ref 150–400)
RBC: 4.52 MIL/uL (ref 4.22–5.81)
RDW: 12.6 % (ref 11.5–15.5)
WBC: 8.2 K/uL (ref 4.0–10.5)
nRBC: 0 % (ref 0.0–0.2)

## 2020-12-13 LAB — SURGICAL PCR SCREEN
MRSA, PCR: NEGATIVE
Staphylococcus aureus: NEGATIVE

## 2020-12-13 LAB — BASIC METABOLIC PANEL
Anion gap: 9 (ref 5–15)
BUN: 9 mg/dL (ref 8–23)
CO2: 27 mmol/L (ref 22–32)
Calcium: 9 mg/dL (ref 8.9–10.3)
Chloride: 102 mmol/L (ref 98–111)
Creatinine, Ser: 1.04 mg/dL (ref 0.61–1.24)
GFR, Estimated: >60 mL/min (ref >60)
Glucose, Bld: 115 mg/dL — ABNORMAL HIGH (ref 70–99)
Potassium: 4 mmol/L (ref 3.5–5.1)
Sodium: 138 mmol/L (ref 135–145)

## 2020-12-13 LAB — GLUCOSE, CAPILLARY: Glucose-Capillary: 129 mg/dL — ABNORMAL HIGH (ref 70–99)

## 2020-12-13 LAB — TYPE AND SCREEN
ABO/RH(D): A POS
Antibody Screen: NEGATIVE

## 2020-12-13 NOTE — Progress Notes (Signed)
PCP - Wende Neighbors Cardiologist - Shelva Majestic Pulmonologist - Chase Caller / Geraldo Pitter Received clearance from pulmonology - 12-08-20  Chest x-ray - 2/22 (no need to repeat per Jeneen Rinks) EKG - 09-28-20 Stress Test - 2017 ECHO - 09-13-20 Cardiac Cath - 05-24-99  SA - yes, does not wear CPAP  Blood Thinner Instructions: Hold Eliquis 3 days prior to sx  ERAS Protcol - yes, no drink ordered or given   COVID TEST- to be admitted, needs to be tested   Anesthesia review: yes, heart history, diff. Intubation, pulmonology history  Patient denies shortness of breath, fever, cough and chest pain at PAT appointment   All instructions explained to the patient, with a verbal understanding of the material. Patient agrees to go over the instructions while at home for a better understanding. Patient also instructed to self quarantine after being tested for COVID-19. The opportunity to ask questions was provided.

## 2020-12-13 NOTE — Assessment & Plan Note (Signed)
-   Patient did not tolerate anti-fibrotics d/t GI side effects, he is currently maintained on Plaquenil. We are monitoring him clinically. He needs repeat Spirometry in the next 3 months.

## 2020-12-13 NOTE — Assessment & Plan Note (Addendum)
From a pulmonary standpoint, this patient is optimized for Thoracic spine surgery in August. He is considered a low-intermediate risk for prolonged mechanical ventilation and for post op pulmonary complications. The patient is independent. Vitals are stable, exam was benign. He is not on oxygen. No recent respiratory infections. Ultimate clearance will be decided upon by the surgeon and anesthesiologist.

## 2020-12-14 ENCOUNTER — Encounter (HOSPITAL_COMMUNITY): Payer: Self-pay

## 2020-12-14 NOTE — Anesthesia Preprocedure Evaluation (Addendum)
Anesthesia Evaluation  Patient identified by MRN, date of birth, ID band Patient awake    Reviewed: Allergy & Precautions, NPO status , Patient's Chart, lab work & pertinent test results, reviewed documented beta blocker date and time   History of Anesthesia Complications (+) DIFFICULT AIRWAY  Airway Mallampati: III  TM Distance: >3 FB Neck ROM: Full  Mouth opening: Limited Mouth Opening  Dental no notable dental hx. (+) Teeth Intact, Dental Advisory Given   Pulmonary sleep apnea ,    Pulmonary exam normal breath sounds clear to auscultation       Cardiovascular hypertension, Pt. on home beta blockers and Pt. on medications Normal cardiovascular exam+ dysrhythmias Atrial Fibrillation  Rhythm:Regular Rate:Normal  TTE 09/13/20 IMPRESSIONS  1. Left ventricular ejection fraction, by estimation, is 55 to 60%. The  left ventricle has normal function. The left ventricle has no regional  wall motion abnormalities. Left ventricular diastolic parameters are  consistent with Grade I diastolic  dysfunction (impaired relaxation).  2. Right ventricular systolic function is normal. The right ventricular  size is normal. Tricuspid regurgitation signal is inadequate for assessing  PA pressure.  3. The mitral valve is grossly normal. Trivial mitral valve  regurgitation. No evidence of mitral stenosis.  4. The aortic valve is tricuspid. Aortic valve regurgitation is not  visualized. No aortic stenosis is present.  5. The inferior vena cava is normal in size with greater than 50%  respiratory variability, suggesting right atrial pressure of 3 mmHg.  - Comparison(s): No significant change from prior study.    Nuclear stress test 03/21/16: Study Highlights The left ventricular ejection fraction is normal (55-65%). Nuclear stress EF: 61%. The study is normal. This is a low risk study. There was no ST segment deviation noted during  stress    Neuro/Psych negative neurological ROS  negative psych ROS   GI/Hepatic Neg liver ROS, GERD  ,  Endo/Other  negative endocrine ROS  Renal/GU negative Renal ROS  negative genitourinary   Musculoskeletal  (+) Arthritis ,   Abdominal   Peds  Hematology  (+) Blood dyscrasia (on eliquis), ,   Anesthesia Other Findings Reported anaphylactic reaction to Xylocaine when used for shoulder injection > 10 years ago, but says he later received marcaine by neurosurgeon Dr. Ellene Route without known reaction  History includes never smoker, DIFFICULT AIRWAY (02/19/18, no issues since), GERD, viral myocarditis (1989, EF 25%, recovered), MVP (2001 cath, not mentioned on 09/13/20 echo), PAF, polymyalgia rheumatica, HLD, pre-diabetes ("steroid induced"), exertional dyspnea, OSA ("mild", no CPAP), pulmonary fibrosis, alpha gal (2015; says he has been able to eat red meat for > 2 1/2 years without reaction), neck surgery (C3-4 ACDF 11/10/19), back surgery (L3-4 microdiskectomy; L2-5 PLIF 02/01/15).  Airway difficulty was on 10/9/9 for right VATS, but no documented difficulty with the last three intubations. 11/10/19 - C3-4 ACDF IV induction.  Mask ventilation without difficulty.  Grade 1 view.  Glidescope. 7.5 mm ET tube, 1 attempt    Reproductive/Obstetrics                            Anesthesia Physical Anesthesia Plan  ASA: 3  Anesthesia Plan: General   Post-op Pain Management:    Induction: Intravenous  PONV Risk Score and Plan: 2 and Midazolam, Dexamethasone and Ondansetron  Airway Management Planned: Oral ETT and Video Laryngoscope Planned  Additional Equipment:   Intra-op Plan:   Post-operative Plan: Extubation in OR  Informed Consent: I have reviewed  the patients History and Physical, chart, labs and discussed the procedure including the risks, benefits and alternatives for the proposed anesthesia with the patient or authorized representative who has  indicated his/her understanding and acceptance.     Dental advisory given  Plan Discussed with: CRNA  Anesthesia Plan Comments: (Avoid lidocaine  )       Anesthesia Quick Evaluation

## 2020-12-14 NOTE — Progress Notes (Signed)
Anesthesia Chart Review:  Case: [id number] Date/Time: 12/22/20 0715   Procedures:      Lumbar 1-2 Posterior lumbar interbody fusion with stabilization from Thoracic 10 to Lumbar 1 with robotic screw placement (Back) - RM 20     APPLICATION OF ROBOTIC ASSISTANCE FOR SPINAL PROCEDURE   Anesthesia type: General   Pre-op diagnosis: Wedge compression fracture of lumbar vertebra   Location: [facility] OR ROOM 20 / [place] [facility]   Surgeons: Randall Miss, MD       DISCUSSION: Patient is a 75 year old male scheduled for the above procedure.  History includes never smoker, DIFFICULT AIRWAY (02/19/18, no issues since), GERD, viral myocarditis (1989, EF 25%, recovered), MVP (2001 cath, not mentioned on 09/13/20 echo), PAF, polymyalgia rheumatica, HLD, pre-diabetes ("steroid induced"), exertional dyspnea, OSA ("mild", no CPAP), pulmonary fibrosis (s/p right VATS, lung biopsy x4 02/19/18, pathology most suggestive of UIP, second opinon sent to Dr. Reggie Roberts at [facility]: chronic fibrosing interstitial pneumonia unclassifiable pattern), perforated sigmoid colon (s/p sigmoid colon resection, end colostomy 06/15/18; colostomy takedown 01/26/19), alpha gal (2015; says he has been able to eat red meat for > 2 1/2 years without reaction), neck surgery (C3-4 ACDF 11/10/19), back surgery (L3-4 microdiskectomy; L2-5 PLIF 02/01/15).   Reported anaphylactic reaction to Xylocaine when used for shoulder injection > 10 years ago, but says he later received marcaine by neurosurgeon Dr. Ellene Roberts without known reaction.    In regards to DIFFICULT AIRWAY history. Difficulty was on 10/9/9 for right VATS, but no documented difficulty with the last three intubations.  11/10/19 - C3-4 ACDF IV induction.  Mask ventilation without difficulty.  Grade 1 view.  Glidescope. 7.5 mm ET tube, 1 attempt  01/26/19 - colostomy takedown IV induction.  Mask ventilation without difficulty.  Grade II view.  Mac and 3. 7.5 mm ET tube, 1 attempt  06/15/18 - Sigmoid resection IV induction.   Rapid sequence and cricoid pressure applied. Grade 1 view.  Stylette and video laryngoscopy. 7.5 mm ET tube, 1 attempt  02/19/18 - right VATS DL X 2 with Mac 4, able to see arytnoids but unable to pass DL ETT, DL x1 with glide scope, 8.0 ETT placed.  31F DL ETT passed over [facility] catheter, visualized with glide scope and positioned with Fiber Optic Bronchoscope   He is followed by pulmonologist Dr. Chase Roberts for interstitial lung disease (diagnosed 2019), and he sees rheumatologist Dr. Amil Roberts for polymyalgia rheumatica.  He had been on CellCept and Ofev for NSIP/IPF but had several interruptions surrounding his colon surgeries in 2020 and then issues of intractable N/V after resuming, particularly with Ofev. Ofev discontinued 12/30/19 due to GI side effects, and he was already off CellCept by this time. [name], but wanted to hold off unless his symptoms progressed. Other pulmonary medications include Symbicort 80-4.5 mcg daily as needed, Zyrtec 10 mg daily as needed, Flonase 50 mcg daily as needed  Pulmonary preoperative input outlined in 12/13/20 visit with Randall Pitter, NP. "From a pulmonary standpoint, this patient is optimized for Thoracic spine surgery in August. He is considered a low-intermediate risk for prolonged mechanical ventilation and for post op pulmonary complications. The patient is independent. Vitals are stable, exam was benign. He is not on oxygen. No recent respiratory infections. Ultimate clearance will be decided upon by the surgeon and anesthesiologist."  Last cardiology visit with Dr. Claiborne Roberts on 09/28/2020.  Patient not having any anginal symptomology.  BP stable.  He was maintaining sinus rhythm off diltiazem 240 mg daly and  metoprolol tartrate 25 mg twice daily.  He is on Eliquis 5 mg twice daily, but notes that he may require neurosurgery in the near future and advised Eliquis be held at a minimum of 72 hours. Formal preoperative input outlined on  11/30/20 by Randall Mood, PA-C, "[name] was last seen on 09/28/20 by Dr. Claiborne Roberts.  Since that day, [name] has done well. He reports today that he has DOE when exerting himself, though stable from previous visits. If going too quickly up stairs, he has DOE, which has been unchanged for some time. No CP. His functional capacity is poor but unchanged from previous visits. RCRI calculated risk is moderate at 6.6% risk of MACE. After review of his chart, and based on ACC/AHA guidelines, the patient would be at acceptable risk for the planned procedure without further cardiovascular testing...hold Eliquis for 3 days prior to the procedure with restart per his surgeon. He does not require bridging with Lovenox around the procedure."  Preoperative COVID-19 testing plan.  Anesthesia team to evaluate on the day of surgery.   VS: BP 107/72   Pulse 74   Temp 37 C (Oral)   Resp 18   Ht _0  (1.727 m)   Wt 84.2 kg   SpO2 96%   BMI 28.24 kg/m    PROVIDERS: Randall Squibb, MD is PCP Randall Majestic, MD is cardiologist Randall Aurora, MD is rheumatologist Randall Males, MD is pulmonologist    LABS: Labs reviewed: Acceptable for surgery. (all labs ordered are listed, but only abnormal results are displayed)  Labs Reviewed  BASIC METABOLIC PANEL - Abnormal; Notable for the following components:      Result Value   Glucose, Bld 115 (*)    All other components within normal limits  CBC - Abnormal; Notable for the following components:   MCV 104.6 (*)    MCH 35.0 (*)    All other components within normal limits  GLUCOSE, CAPILLARY - Abnormal; Notable for the following components:   Glucose-Capillary 129 (*)    All other components within normal limits  SURGICAL PCR SCREEN  TYPE AND SCREEN    PFT 07/06/20: FVC 3.50 (86%), post 3.46 (85%). FEV1 2.82 (96%), post 2.89 (98%). DLCO unc/cor 19.11 (79%). Conclusions: The reduced lung volumes, increased FEV1/FVC ratio and  diffusion defect suggest an interstitial process such as fibrosis or interstitial inflammation. Pulmonary Function Diagnosis: Minimal Restriction -Interstitial Minimal Diffusion Defect   IMAGES: MRI L-spine 11/22/20 ([facility]/PACS): MPRESSION: 1. Similar appearance of an L1 superior endplate fracture with approximately 50% height loss, similar mild edema along the right eccentric endplate, and mild (2 mm) bony retropulsion with mild T12-L1 canal stenosis. 2. At L1-L2, similar versus slightly improved moderate canal stenosis and moderate to severe left subarticular recess stenosis with left subarticular disc herniation. Similar moderate bilateral foraminal stenosis. 3. At T12-L1, mildly improved mild to moderate canal stenosis.  CT Chest (high resolution) 06/29/20: IMPRESSION: 1. No significant interval change in pattern of mild pulmonary fibrosis featuring irregular peripheral interstitial opacity and mild, tubular bronchiectasis at the lung basis without evidence of significant subpleural bronchiolectasis or honeycombing. Findings remain consistent with an "indeterminate for UIP" pattern of fibrosis by pulmonary fibrosis criteria. Findings are indeterminate for UIP per consensus guidelines: Diagnosis of Idiopathic Pulmonary Fibrosis: An Official ATS/ERS/JRS/ALAT Clinical Practice Guideline. Koppel, Iss 5, [phone], Jan 12 2017. 2. Evidence of prior right lung wedge resections. - Aortic Atherosclerosis (ICD10-I70.0).  EKG: 09/28/20: NSR with sinus arrhythmia   CV: TTE 09/13/20 IMPRESSIONS   1. Left ventricular ejection fraction, by estimation, is 55 to 60%. The  left ventricle has normal function. The left ventricle has no regional  wall motion abnormalities. Left ventricular diastolic parameters are  consistent with Grade I diastolic  dysfunction (impaired relaxation).   2. Right ventricular systolic function is normal. The right ventricular  size  is normal. Tricuspid regurgitation signal is inadequate for assessing  PA pressure.   3. The mitral valve is grossly normal. Trivial mitral valve  regurgitation. No evidence of mitral stenosis.   4. The aortic valve is tricuspid. Aortic valve regurgitation is not  visualized. No aortic stenosis is present.   5. The inferior vena cava is normal in size with greater than 50%  respiratory variability, suggesting right atrial pressure of 3 mmHg.  - Comparison(s): No significant change from prior study.    Nuclear stress test 03/21/16: Study Highlights The left ventricular ejection fraction is normal (55-65%). Nuclear stress EF: 61%. The study is normal. This is a low risk study. There was no ST segment deviation noted during stress.   Cardiac cath 05/24/99: Impression: 1.  Normal left ventricular function. 2.  Angiographic mitral valve prolapse. 3.  No evidence for obstructive coronary artery disease. 4.  Mid left anterior descending coronary artery muscle bridging in an intramyocardial segment with normal appearance during diastole and narrowing up to 60% during systole. Recommendations: 1.  Addition of nitrate versus calcium channel blockade. 2.  Beta-blocker regimen, as blood pressure tolerates it.   Past Medical History:  Diagnosis Date   Allergy to alpha-gal    2015; has been able to re-introduce red meat for the past 2 1/2 years without reaction (as of 01/21/19)   Arthritis    Chest pain    a. 03/2017: echo showing EF of 60-65%, no regional WMA or significant valve abnormalities. b. 03/2016: NST with no evidence of ischemia.    Complication of anesthesia    Digestive problems    on Prednisone prn for this issue- diarrhea   Dyspnea    with activity   Dysrhythmia    irregular due to Myocarditis- takes Atenolol, Norvasc   GERD (gastroesophageal reflux disease)    H/O viral myocarditis    25 years ago   Head injury, closed, with concussion    Breif LOC   Hyperlipidemia     Mild mitral valve prolapse    per Dr Evette Georges notes   PAF (paroxysmal atrial fibrillation) ([place])    a. initially occuring in 02/2016. b. recurrent in 07/2016. Placed on Eliquis   Polymyalgia rheumatica (HCC)    Pre-diabetes    Pulmonary fibrosis (HCC)    Sleep apnea    mild sleep apnea   no CPAP    Past Surgical History:  Procedure Laterality Date   ANTERIOR CERVICAL DECOMP/DISCECTOMY FUSION N/A 11/10/2019   Procedure: Cervical Three-Four Anterior cervical decompression/discectomy/fusion;  Surgeon: Randall Miss, MD;  Location: [place];  Service: Neurosurgery;  Laterality: N/A;  Cervical Three-Four Anterior cervical decompression/discectomy/fusion   apendectomy  1965   APPENDECTOMY     BACK SURGERY     bone spur Bilateral 1999   feet   CARDIAC CATHETERIZATION  2001   CHOLECYSTECTOMY  2004   COLON RESECTION N/A 06/15/2018   Procedure: SIGMOID COLON RESECTION;  Surgeon: [name], MD;  Location: [facility];  Service: General;  Laterality: N/A;   COLONOSCOPY     COLOSTOMY N/A 06/15/2018  Procedure: COLOSTOMY;  Surgeon: [name], MD;  Location: [facility];  Service: General;  Laterality: N/A;   COLOSTOMY TAKEDOWN N/A 01/26/2019   Procedure: LAPAROSCOPIC ASSISTED COLOSTOMY TAKEDOWN;  Surgeon: [name], MD;  Location: [place];  Service: General;  Laterality: N/A;   EYE SURGERY Bilateral    cataract removal   LAPAROSCOPIC LYSIS OF ADHESIONS N/A 01/26/2019   Procedure: LAPAROSCOPIC LYSIS OF ADHESIONS;  Surgeon: [name], MD;  Location: [place];  Service: General;  Laterality: N/A;   LAPAROTOMY N/A 06/15/2018   Procedure: EXPLORATORY LAPAROTOMY;  Surgeon: [name], MD;  Location: [facility];  Service: General;  Laterality: N/A;   LUMBAR LAMINECTOMY/ DECOMPRESSION WITH MET-RX Right 05/05/2013   Procedure: Right Lumbar three-four Extraforaminal Microdiskectomy with Metrex;  Surgeon: Randall Miss, MD;  Location: [facility];  Service: Neurosurgery;  Laterality: Right;  Right Lumbar  three-four Extraforaminal Microdiskectomy with Metrex   LUNG BIOPSY Right 02/19/2018   Procedure: LUNG BIOPSY;  Surgeon: [name], MD;  Location: [place];  Service: Thoracic;  Laterality: Right;   SHOULDER OPEN ROTATOR CUFF REPAIR Bilateral 2001   TONSILLECTOMY     VIDEO ASSISTED THORACOSCOPY Right 02/19/2018   Procedure: VIDEO ASSISTED THORACOSCOPY;  Surgeon: [name], MD;  Location: [facility];  Service: Thoracic;  Laterality: Right;    MEDICATIONS:  acetaminophen (TYLENOL) 650 MG CR tablet   budesonide-formoterol (SYMBICORT) 80-4.5 MCG/ACT inhaler   cetirizine (ZYRTEC) 10 MG tablet   Cholecalciferol (DIALYVITE VITAMIN D 5000) 125 MCG (5000 UT) capsule   diltiazem (CARDIZEM CD) 240 MG 24 hr capsule   diphenoxylate-atropine (LOMOTIL) 2.5-0.025 MG tablet   ELIQUIS 5 MG TABS tablet   ezetimibe (ZETIA) 10 MG tablet   fluticasone (FLONASE) 50 MCG/ACT nasal spray   gabapentin (NEURONTIN) 300 MG capsule   hydroxychloroquine (PLAQUENIL) 200 MG tablet   metoprolol tartrate (LOPRESSOR) 25 MG tablet   nystatin (MYCOSTATIN) 100000 UNIT/ML suspension   ondansetron (ZOFRAN) 4 MG tablet   predniSONE (DELTASONE) 5 MG tablet   rosuvastatin (CRESTOR) 10 MG tablet   spironolactone (ALDACTONE) 25 MG tablet   tamsulosin (FLOMAX) 0.4 MG CAPS capsule   Trolamine Salicylate (ASPERCREME EX)   No current facility-administered medications for this encounter.    Myra Gianotti, PA-C Surgical Short Stay/Anesthesiology [facility] Phone [phone] [facility] Phone [phone] 12/14/2020 5:26 PM

## 2020-12-19 ENCOUNTER — Other Ambulatory Visit: Payer: Self-pay | Admitting: Neurological Surgery

## 2020-12-19 LAB — SARS CORONAVIRUS 2 (TAT 6-24 HRS): SARS Coronavirus 2: NEGATIVE

## 2020-12-21 ENCOUNTER — Other Ambulatory Visit: Payer: Self-pay

## 2020-12-21 ENCOUNTER — Ambulatory Visit (HOSPITAL_COMMUNITY)
Admission: RE | Admit: 2020-12-21 | Discharge: 2020-12-21 | Disposition: A | Discharge status: Home / Self Care | Payer: PPO | Source: Ambulatory Visit | Attending: Neurological Surgery | Admitting: Neurological Surgery

## 2020-12-21 DIAGNOSIS — I480 Paroxysmal atrial fibrillation: Secondary | ICD-10-CM | POA: Diagnosis present

## 2020-12-21 DIAGNOSIS — M4326 Fusion of spine, lumbar region: Secondary | ICD-10-CM | POA: Diagnosis not present

## 2020-12-21 DIAGNOSIS — Z888 Allergy status to other drugs, medicaments and biological substances status: Secondary | ICD-10-CM | POA: Diagnosis not present

## 2020-12-21 DIAGNOSIS — M4319 Spondylolisthesis, multiple sites in spine: Secondary | ICD-10-CM | POA: Diagnosis not present

## 2020-12-21 DIAGNOSIS — M5416 Radiculopathy, lumbar region: Secondary | ICD-10-CM | POA: Diagnosis not present

## 2020-12-21 DIAGNOSIS — S32010G Wedge compression fracture of first lumbar vertebra, subsequent encounter for fracture with delayed healing: Secondary | ICD-10-CM | POA: Insufficient documentation

## 2020-12-21 DIAGNOSIS — M353 Polymyalgia rheumatica: Secondary | ICD-10-CM | POA: Diagnosis present

## 2020-12-21 DIAGNOSIS — Z79899 Other long term (current) drug therapy: Secondary | ICD-10-CM | POA: Diagnosis not present

## 2020-12-21 DIAGNOSIS — M4856XA Collapsed vertebra, not elsewhere classified, lumbar region, initial encounter for fracture: Secondary | ICD-10-CM | POA: Diagnosis present

## 2020-12-21 DIAGNOSIS — R7303 Prediabetes: Secondary | ICD-10-CM | POA: Diagnosis present

## 2020-12-21 DIAGNOSIS — Z981 Arthrodesis status: Secondary | ICD-10-CM | POA: Diagnosis not present

## 2020-12-21 DIAGNOSIS — Z885 Allergy status to narcotic agent status: Secondary | ICD-10-CM | POA: Diagnosis not present

## 2020-12-21 DIAGNOSIS — Z7901 Long term (current) use of anticoagulants: Secondary | ICD-10-CM | POA: Diagnosis not present

## 2020-12-21 DIAGNOSIS — E785 Hyperlipidemia, unspecified: Secondary | ICD-10-CM | POA: Diagnosis present

## 2020-12-21 DIAGNOSIS — G4730 Sleep apnea, unspecified: Secondary | ICD-10-CM | POA: Diagnosis present

## 2020-12-21 DIAGNOSIS — K219 Gastro-esophageal reflux disease without esophagitis: Secondary | ICD-10-CM | POA: Diagnosis present

## 2020-12-21 DIAGNOSIS — M4804 Spinal stenosis, thoracic region: Secondary | ICD-10-CM | POA: Diagnosis not present

## 2020-12-21 DIAGNOSIS — I700 Atherosclerosis of aorta: Secondary | ICD-10-CM | POA: Diagnosis not present

## 2020-12-21 DIAGNOSIS — J8410 Pulmonary fibrosis, unspecified: Secondary | ICD-10-CM | POA: Diagnosis present

## 2020-12-21 DIAGNOSIS — I341 Nonrheumatic mitral (valve) prolapse: Secondary | ICD-10-CM | POA: Diagnosis present

## 2020-12-21 DIAGNOSIS — M810 Age-related osteoporosis without current pathological fracture: Secondary | ICD-10-CM | POA: Diagnosis present

## 2020-12-21 DIAGNOSIS — I10 Essential (primary) hypertension: Secondary | ICD-10-CM | POA: Diagnosis present

## 2020-12-21 DIAGNOSIS — M5116 Intervertebral disc disorders with radiculopathy, lumbar region: Secondary | ICD-10-CM | POA: Diagnosis present

## 2020-12-21 DIAGNOSIS — M47814 Spondylosis without myelopathy or radiculopathy, thoracic region: Secondary | ICD-10-CM | POA: Diagnosis not present

## 2020-12-21 DIAGNOSIS — M48062 Spinal stenosis, lumbar region with neurogenic claudication: Secondary | ICD-10-CM | POA: Diagnosis present

## 2020-12-21 DIAGNOSIS — Z7951 Long term (current) use of inhaled steroids: Secondary | ICD-10-CM | POA: Diagnosis not present

## 2020-12-22 ENCOUNTER — Other Ambulatory Visit: Payer: Self-pay

## 2020-12-22 ENCOUNTER — Encounter (HOSPITAL_COMMUNITY): Payer: Self-pay | Admitting: Neurological Surgery

## 2020-12-22 ENCOUNTER — Inpatient Hospital Stay (HOSPITAL_COMMUNITY): Payer: PPO | Admitting: Physician Assistant

## 2020-12-22 ENCOUNTER — Inpatient Hospital Stay (HOSPITAL_COMMUNITY): Payer: PPO

## 2020-12-22 ENCOUNTER — Inpatient Hospital Stay (HOSPITAL_COMMUNITY): Payer: PPO | Admitting: Anesthesiology

## 2020-12-22 ENCOUNTER — Inpatient Hospital Stay (HOSPITAL_COMMUNITY)
Admission: RE | Disposition: A | Discharge status: Home / Self Care | Payer: Self-pay | Source: Home / Self Care | Attending: Neurological Surgery

## 2020-12-22 ENCOUNTER — Inpatient Hospital Stay (HOSPITAL_COMMUNITY)
Admission: RE | Admit: 2020-12-22 | Discharge: 2020-12-24 | DRG: 454 | Disposition: A | Discharge status: Home Health | Payer: PPO | Attending: Neurological Surgery | Admitting: Neurological Surgery

## 2020-12-22 DIAGNOSIS — M353 Polymyalgia rheumatica: Secondary | ICD-10-CM | POA: Diagnosis present

## 2020-12-22 DIAGNOSIS — K219 Gastro-esophageal reflux disease without esophagitis: Secondary | ICD-10-CM | POA: Diagnosis present

## 2020-12-22 DIAGNOSIS — I341 Nonrheumatic mitral (valve) prolapse: Secondary | ICD-10-CM | POA: Diagnosis present

## 2020-12-22 DIAGNOSIS — Z981 Arthrodesis status: Secondary | ICD-10-CM | POA: Diagnosis not present

## 2020-12-22 DIAGNOSIS — M810 Age-related osteoporosis without current pathological fracture: Secondary | ICD-10-CM | POA: Diagnosis present

## 2020-12-22 DIAGNOSIS — Z7951 Long term (current) use of inhaled steroids: Secondary | ICD-10-CM | POA: Diagnosis not present

## 2020-12-22 DIAGNOSIS — M48062 Spinal stenosis, lumbar region with neurogenic claudication: Principal | ICD-10-CM | POA: Diagnosis present

## 2020-12-22 DIAGNOSIS — Z888 Allergy status to other drugs, medicaments and biological substances status: Secondary | ICD-10-CM

## 2020-12-22 DIAGNOSIS — I10 Essential (primary) hypertension: Secondary | ICD-10-CM | POA: Diagnosis present

## 2020-12-22 DIAGNOSIS — J8410 Pulmonary fibrosis, unspecified: Secondary | ICD-10-CM | POA: Diagnosis present

## 2020-12-22 DIAGNOSIS — Z885 Allergy status to narcotic agent status: Secondary | ICD-10-CM | POA: Diagnosis not present

## 2020-12-22 DIAGNOSIS — G4730 Sleep apnea, unspecified: Secondary | ICD-10-CM | POA: Diagnosis present

## 2020-12-22 DIAGNOSIS — I480 Paroxysmal atrial fibrillation: Secondary | ICD-10-CM | POA: Diagnosis present

## 2020-12-22 DIAGNOSIS — Z7901 Long term (current) use of anticoagulants: Secondary | ICD-10-CM

## 2020-12-22 DIAGNOSIS — Z79899 Other long term (current) drug therapy: Secondary | ICD-10-CM

## 2020-12-22 DIAGNOSIS — R7303 Prediabetes: Secondary | ICD-10-CM | POA: Diagnosis present

## 2020-12-22 DIAGNOSIS — M5116 Intervertebral disc disorders with radiculopathy, lumbar region: Secondary | ICD-10-CM | POA: Diagnosis present

## 2020-12-22 DIAGNOSIS — M4856XA Collapsed vertebra, not elsewhere classified, lumbar region, initial encounter for fracture: Secondary | ICD-10-CM | POA: Diagnosis present

## 2020-12-22 DIAGNOSIS — M4326 Fusion of spine, lumbar region: Secondary | ICD-10-CM | POA: Diagnosis not present

## 2020-12-22 DIAGNOSIS — E785 Hyperlipidemia, unspecified: Secondary | ICD-10-CM | POA: Diagnosis present

## 2020-12-22 DIAGNOSIS — Z419 Encounter for procedure for purposes other than remedying health state, unspecified: Secondary | ICD-10-CM

## 2020-12-22 HISTORY — PX: POSTERIOR LUMBAR FUSION 4 LEVEL: SHX6037

## 2020-12-22 HISTORY — PX: APPLICATION OF ROBOTIC ASSISTANCE FOR SPINAL PROCEDURE: SHX6753

## 2020-12-22 LAB — GLUCOSE, CAPILLARY: Glucose-Capillary: 122 mg/dL — ABNORMAL HIGH (ref 70–99)

## 2020-12-22 SURGERY — POSTERIOR LUMBAR FUSION 4 LEVEL
Anesthesia: General | Site: Back

## 2020-12-22 MED ORDER — HYDROMORPHONE HCL 2 MG PO TABS
2.0000 mg | ORAL_TABLET | ORAL | Status: DC | PRN
  Administered 2020-12-23: 2 mg via ORAL
  Filled 2020-12-22 (×2): qty 1

## 2020-12-22 MED ORDER — LORATADINE 10 MG PO TABS
10.0000 mg | ORAL_TABLET | Freq: Every day | ORAL | Status: DC
  Administered 2020-12-23: 10 mg via ORAL
  Filled 2020-12-22: qty 1

## 2020-12-22 MED ORDER — SODIUM CHLORIDE (PF) 0.9 % IJ SOLN
INTRAMUSCULAR | Status: AC
  Filled 2020-12-22: qty 20

## 2020-12-22 MED ORDER — POLYETHYLENE GLYCOL 3350 17 G PO PACK: 17.0000 g | PACK | Freq: Every day | ORAL | Status: DC | PRN

## 2020-12-22 MED ORDER — ACETAMINOPHEN 500 MG PO TABS
1000.0000 mg | ORAL_TABLET | Freq: Once | ORAL | Status: DC
  Filled 2020-12-22: qty 2

## 2020-12-22 MED ORDER — SODIUM CHLORIDE 0.9% FLUSH: 3.0000 mL | INTRAVENOUS | Status: DC | PRN

## 2020-12-22 MED ORDER — ONDANSETRON HCL 4 MG PO TABS: 4.0000 mg | ORAL_TABLET | Freq: Three times a day (TID) | ORAL | Status: DC | PRN

## 2020-12-22 MED ORDER — PROPOFOL 10 MG/ML IV BOLUS
INTRAVENOUS | Status: AC
  Filled 2020-12-22: qty 20

## 2020-12-22 MED ORDER — THROMBIN 20000 UNITS EX SOLR
CUTANEOUS | Status: AC
  Filled 2020-12-22: qty 20000

## 2020-12-22 MED ORDER — HYDROMORPHONE HCL 1 MG/ML IJ SOLN
0.2500 mg | INTRAMUSCULAR | Status: DC | PRN
  Administered 2020-12-22 (×4): 0.5 mg via INTRAVENOUS

## 2020-12-22 MED ORDER — ROCURONIUM BROMIDE 10 MG/ML (PF) SYRINGE
PREFILLED_SYRINGE | INTRAVENOUS | Status: AC
  Filled 2020-12-22: qty 20

## 2020-12-22 MED ORDER — ONDANSETRON HCL 4 MG/2ML IJ SOLN
4.0000 mg | Freq: Once | INTRAMUSCULAR | Status: AC
  Administered 2020-12-22: 4 mg via INTRAVENOUS

## 2020-12-22 MED ORDER — MENTHOL 3 MG MT LOZG: 1.0000 | LOZENGE | OROMUCOSAL | Status: DC | PRN

## 2020-12-22 MED ORDER — BUPIVACAINE HCL (PF) 0.5 % IJ SOLN
INTRAMUSCULAR | Status: DC | PRN
  Administered 2020-12-22 (×2): 15 mL

## 2020-12-22 MED ORDER — ROCURONIUM BROMIDE 10 MG/ML (PF) SYRINGE
PREFILLED_SYRINGE | INTRAVENOUS | Status: DC | PRN
  Administered 2020-12-22: 30 mg via INTRAVENOUS
  Administered 2020-12-22: 70 mg via INTRAVENOUS
  Administered 2020-12-22 (×2): 20 mg via INTRAVENOUS

## 2020-12-22 MED ORDER — PHENYLEPHRINE 40 MCG/ML (10ML) SYRINGE FOR IV PUSH (FOR BLOOD PRESSURE SUPPORT)
PREFILLED_SYRINGE | INTRAVENOUS | Status: DC | PRN
  Administered 2020-12-22: 80 ug via INTRAVENOUS
  Administered 2020-12-22: 40 ug via INTRAVENOUS
  Administered 2020-12-22: 80 ug via INTRAVENOUS

## 2020-12-22 MED ORDER — HYDROMORPHONE HCL 1 MG/ML IJ SOLN
INTRAMUSCULAR | Status: AC
  Filled 2020-12-22: qty 1

## 2020-12-22 MED ORDER — SODIUM CHLORIDE 0.9 % IV SOLN: INTRAVENOUS | Status: DC | PRN

## 2020-12-22 MED ORDER — CEFAZOLIN SODIUM-DEXTROSE 2-4 GM/100ML-% IV SOLN
2.0000 g | INTRAVENOUS | Status: AC
  Administered 2020-12-22 (×2): 2 g via INTRAVENOUS
  Filled 2020-12-22: qty 100

## 2020-12-22 MED ORDER — CHLORHEXIDINE GLUCONATE CLOTH 2 % EX PADS: 6.0000 | MEDICATED_PAD | Freq: Once | CUTANEOUS | Status: DC

## 2020-12-22 MED ORDER — FLEET ENEMA 7-19 GM/118ML RE ENEM
1.0000 | ENEMA | Freq: Once | RECTAL | Status: DC | PRN
Start: 2020-12-22 — End: 2020-12-24

## 2020-12-22 MED ORDER — HYDROXYCHLOROQUINE SULFATE 200 MG PO TABS
200.0000 mg | ORAL_TABLET | Freq: Two times a day (BID) | ORAL | Status: DC
  Administered 2020-12-22 – 2020-12-23 (×3): 200 mg via ORAL
  Filled 2020-12-22 (×5): qty 1

## 2020-12-22 MED ORDER — GABAPENTIN 300 MG PO CAPS
300.0000 mg | ORAL_CAPSULE | Freq: Two times a day (BID) | ORAL | Status: DC
  Administered 2020-12-22 – 2020-12-23 (×3): 300 mg via ORAL
  Filled 2020-12-22 (×3): qty 1

## 2020-12-22 MED ORDER — THROMBIN 20000 UNITS EX SOLR
CUTANEOUS | Status: DC | PRN
  Administered 2020-12-22: 20 mL via TOPICAL

## 2020-12-22 MED ORDER — EZETIMIBE 10 MG PO TABS
10.0000 mg | ORAL_TABLET | Freq: Every day | ORAL | Status: DC
  Administered 2020-12-23: 10 mg via ORAL
  Filled 2020-12-22 (×2): qty 1

## 2020-12-22 MED ORDER — ROSUVASTATIN CALCIUM 5 MG PO TABS
5.0000 mg | ORAL_TABLET | ORAL | Status: DC
  Administered 2020-12-23: 5 mg via ORAL
  Filled 2020-12-22: qty 1

## 2020-12-22 MED ORDER — METOPROLOL TARTRATE 25 MG PO TABS
25.0000 mg | ORAL_TABLET | Freq: Two times a day (BID) | ORAL | Status: DC
  Administered 2020-12-22 – 2020-12-23 (×2): 25 mg via ORAL
  Filled 2020-12-22 (×3): qty 1

## 2020-12-22 MED ORDER — FLUTICASONE PROPIONATE 50 MCG/ACT NA SUSP: 2.0000 | Freq: Every day | NASAL | Status: DC | PRN

## 2020-12-22 MED ORDER — THROMBIN 5000 UNITS EX SOLR
OROMUCOSAL | Status: DC | PRN
  Administered 2020-12-22 (×2): 5 mL via TOPICAL

## 2020-12-22 MED ORDER — MOMETASONE FURO-FORMOTEROL FUM 100-5 MCG/ACT IN AERO
2.0000 | INHALATION_SPRAY | Freq: Two times a day (BID) | RESPIRATORY_TRACT | Status: DC
  Filled 2020-12-22: qty 8.8

## 2020-12-22 MED ORDER — ORAL CARE MOUTH RINSE: 15.0000 mL | Freq: Once | OROMUCOSAL | Status: AC

## 2020-12-22 MED ORDER — KETAMINE HCL 50 MG/ML IJ SOLN
INTRAMUSCULAR | Status: DC | PRN
  Administered 2020-12-22: 30 mg via INTRAMUSCULAR

## 2020-12-22 MED ORDER — SUGAMMADEX SODIUM 200 MG/2ML IV SOLN
INTRAVENOUS | Status: DC | PRN
  Administered 2020-12-22: 200 mg via INTRAVENOUS

## 2020-12-22 MED ORDER — ONDANSETRON HCL 4 MG/2ML IJ SOLN
INTRAMUSCULAR | Status: AC
  Filled 2020-12-22: qty 2

## 2020-12-22 MED ORDER — HYDROMORPHONE HCL 1 MG/ML IJ SOLN
INTRAMUSCULAR | Status: AC
  Filled 2020-12-22: qty 0.5

## 2020-12-22 MED ORDER — PHENOL 1.4 % MT LIQD: 1.0000 | OROMUCOSAL | Status: DC | PRN

## 2020-12-22 MED ORDER — HYDROMORPHONE HCL 1 MG/ML IJ SOLN: 1.0000 mg | INTRAMUSCULAR | Status: DC | PRN

## 2020-12-22 MED ORDER — SODIUM CHLORIDE 0.9 % IV SOLN: 250.0000 mL | INTRAVENOUS | Status: DC

## 2020-12-22 MED ORDER — EPHEDRINE 5 MG/ML INJ
INTRAVENOUS | Status: AC
  Filled 2020-12-22: qty 5

## 2020-12-22 MED ORDER — KETAMINE HCL 50 MG/5ML IJ SOSY
PREFILLED_SYRINGE | INTRAMUSCULAR | Status: AC
  Filled 2020-12-22: qty 5

## 2020-12-22 MED ORDER — ALBUMIN HUMAN 5 % IV SOLN: INTRAVENOUS | Status: DC | PRN

## 2020-12-22 MED ORDER — HYDROXYZINE HCL 50 MG/ML IM SOLN
50.0000 mg | Freq: Four times a day (QID) | INTRAMUSCULAR | Status: DC | PRN
  Administered 2020-12-22: 50 mg via INTRAMUSCULAR
  Filled 2020-12-22: qty 1

## 2020-12-22 MED ORDER — PROPOFOL 10 MG/ML IV BOLUS
INTRAVENOUS | Status: DC | PRN
  Administered 2020-12-22: 120 mg via INTRAVENOUS

## 2020-12-22 MED ORDER — SENNA 8.6 MG PO TABS
1.0000 | ORAL_TABLET | Freq: Two times a day (BID) | ORAL | Status: DC
  Administered 2020-12-22 – 2020-12-23 (×3): 8.6 mg via ORAL
  Filled 2020-12-22 (×3): qty 1

## 2020-12-22 MED ORDER — TAMSULOSIN HCL 0.4 MG PO CAPS
0.4000 mg | ORAL_CAPSULE | Freq: Two times a day (BID) | ORAL | Status: DC
  Administered 2020-12-22 – 2020-12-23 (×3): 0.4 mg via ORAL
  Filled 2020-12-22 (×3): qty 1

## 2020-12-22 MED ORDER — DOCUSATE SODIUM 100 MG PO CAPS
100.0000 mg | ORAL_CAPSULE | Freq: Two times a day (BID) | ORAL | Status: DC
  Administered 2020-12-22 – 2020-12-23 (×3): 100 mg via ORAL
  Filled 2020-12-22 (×3): qty 1

## 2020-12-22 MED ORDER — VITAMIN D 25 MCG (1000 UNIT) PO TABS: 5000.0000 [IU] | ORAL_TABLET | Freq: Every day | ORAL | Status: DC

## 2020-12-22 MED ORDER — KETOROLAC TROMETHAMINE 15 MG/ML IJ SOLN
7.5000 mg | Freq: Four times a day (QID) | INTRAMUSCULAR | Status: AC
  Administered 2020-12-22 – 2020-12-23 (×4): 7.5 mg via INTRAVENOUS
  Filled 2020-12-22 (×4): qty 1

## 2020-12-22 MED ORDER — LACTATED RINGERS IV SOLN: INTRAVENOUS | Status: DC

## 2020-12-22 MED ORDER — 0.9 % SODIUM CHLORIDE (POUR BTL) OPTIME
TOPICAL | Status: DC | PRN
  Administered 2020-12-22: 2000 mL

## 2020-12-22 MED ORDER — EPHEDRINE SULFATE-NACL 50-0.9 MG/10ML-% IV SOSY: PREFILLED_SYRINGE | INTRAVENOUS | Status: DC | PRN

## 2020-12-22 MED ORDER — METHOCARBAMOL 500 MG PO TABS
ORAL_TABLET | ORAL | Status: AC
  Filled 2020-12-22: qty 1

## 2020-12-22 MED ORDER — FENTANYL CITRATE (PF) 250 MCG/5ML IJ SOLN
INTRAMUSCULAR | Status: DC | PRN
  Administered 2020-12-22: 50 ug via INTRAVENOUS
  Administered 2020-12-22 (×2): 75 ug via INTRAVENOUS
  Administered 2020-12-22: 50 ug via INTRAVENOUS

## 2020-12-22 MED ORDER — BUPIVACAINE HCL (PF) 0.5 % IJ SOLN
INTRAMUSCULAR | Status: AC
  Filled 2020-12-22: qty 30

## 2020-12-22 MED ORDER — ACETAMINOPHEN 650 MG RE SUPP: 650.0000 mg | RECTAL | Status: DC | PRN

## 2020-12-22 MED ORDER — THROMBIN 5000 UNITS EX SOLR
CUTANEOUS | Status: AC
  Filled 2020-12-22: qty 5000

## 2020-12-22 MED ORDER — FENTANYL CITRATE (PF) 100 MCG/2ML IJ SOLN: 25.0000 ug | INTRAMUSCULAR | Status: DC | PRN

## 2020-12-22 MED ORDER — GLYCOPYRROLATE PF 0.2 MG/ML IJ SOSY
PREFILLED_SYRINGE | INTRAMUSCULAR | Status: AC
  Filled 2020-12-22: qty 1

## 2020-12-22 MED ORDER — CEFAZOLIN SODIUM-DEXTROSE 2-4 GM/100ML-% IV SOLN
2.0000 g | Freq: Three times a day (TID) | INTRAVENOUS | Status: AC
Start: 2020-12-22 — End: 2020-12-23
  Administered 2020-12-22 – 2020-12-23 (×2): 2 g via INTRAVENOUS
  Filled 2020-12-22 (×2): qty 100

## 2020-12-22 MED ORDER — EPHEDRINE SULFATE-NACL 50-0.9 MG/10ML-% IV SOSY
PREFILLED_SYRINGE | INTRAVENOUS | Status: DC | PRN
  Administered 2020-12-22: 10 mg via INTRAVENOUS
  Administered 2020-12-22 (×2): 5 mg via INTRAVENOUS
  Administered 2020-12-22 (×2): 10 mg via INTRAVENOUS
  Administered 2020-12-22: 5 mg via INTRAVENOUS

## 2020-12-22 MED ORDER — PHENYLEPHRINE HCL-NACL 20-0.9 MG/250ML-% IV SOLN
INTRAVENOUS | Status: DC | PRN
Start: 2020-12-22 — End: 2020-12-22
  Administered 2020-12-22: 25 ug/min via INTRAVENOUS

## 2020-12-22 MED ORDER — GLYCOPYRROLATE PF 0.2 MG/ML IJ SOSY
PREFILLED_SYRINGE | INTRAMUSCULAR | Status: DC | PRN
  Administered 2020-12-22: .2 mg via INTRAVENOUS

## 2020-12-22 MED ORDER — PREDNISONE 5 MG PO TABS
5.0000 mg | ORAL_TABLET | Freq: Every day | ORAL | Status: DC
  Administered 2020-12-23: 5 mg via ORAL
  Filled 2020-12-22 (×2): qty 1

## 2020-12-22 MED ORDER — CHLORHEXIDINE GLUCONATE 0.12 % MT SOLN
15.0000 mL | Freq: Once | OROMUCOSAL | Status: AC
  Administered 2020-12-22: 15 mL via OROMUCOSAL
  Filled 2020-12-22: qty 15

## 2020-12-22 MED ORDER — DILTIAZEM HCL ER COATED BEADS 120 MG PO CP24
240.0000 mg | ORAL_CAPSULE | Freq: Every day | ORAL | Status: DC
  Administered 2020-12-23: 240 mg via ORAL
  Filled 2020-12-22: qty 2

## 2020-12-22 MED ORDER — METHOCARBAMOL 500 MG PO TABS
500.0000 mg | ORAL_TABLET | Freq: Four times a day (QID) | ORAL | Status: DC | PRN
  Administered 2020-12-23 – 2020-12-24 (×3): 500 mg via ORAL
  Filled 2020-12-22 (×3): qty 1

## 2020-12-22 MED ORDER — HYDROMORPHONE HCL 1 MG/ML IJ SOLN
INTRAMUSCULAR | Status: DC | PRN
  Administered 2020-12-22: 1 mg via INTRAVENOUS

## 2020-12-22 MED ORDER — METHOCARBAMOL 1000 MG/10ML IJ SOLN
500.0000 mg | Freq: Four times a day (QID) | INTRAVENOUS | Status: DC | PRN
  Filled 2020-12-22: qty 5

## 2020-12-22 MED ORDER — ACETAMINOPHEN 325 MG PO TABS
650.0000 mg | ORAL_TABLET | ORAL | Status: DC | PRN
  Administered 2020-12-22 – 2020-12-24 (×4): 650 mg via ORAL
  Filled 2020-12-22 (×4): qty 2

## 2020-12-22 MED ORDER — FENTANYL CITRATE (PF) 250 MCG/5ML IJ SOLN
INTRAMUSCULAR | Status: AC
  Filled 2020-12-22: qty 5

## 2020-12-22 MED ORDER — PHENYLEPHRINE 40 MCG/ML (10ML) SYRINGE FOR IV PUSH (FOR BLOOD PRESSURE SUPPORT)
PREFILLED_SYRINGE | INTRAVENOUS | Status: AC
  Filled 2020-12-22: qty 10

## 2020-12-22 MED ORDER — DIPHENOXYLATE-ATROPINE 2.5-0.025 MG PO TABS: 1.0000 | ORAL_TABLET | Freq: Four times a day (QID) | ORAL | Status: DC | PRN

## 2020-12-22 MED ORDER — ONDANSETRON HCL 4 MG/2ML IJ SOLN
INTRAMUSCULAR | Status: DC | PRN
  Administered 2020-12-22: 4 mg via INTRAVENOUS

## 2020-12-22 MED ORDER — SODIUM CHLORIDE 0.9% FLUSH
3.0000 mL | Freq: Two times a day (BID) | INTRAVENOUS | Status: DC
  Administered 2020-12-23 (×2): 3 mL via INTRAVENOUS

## 2020-12-22 MED ORDER — CEFAZOLIN SODIUM 1 G IJ SOLR
INTRAMUSCULAR | Status: AC
  Filled 2020-12-22: qty 20

## 2020-12-22 MED ORDER — BISACODYL 10 MG RE SUPP: 10.0000 mg | Freq: Every day | RECTAL | Status: DC | PRN

## 2020-12-22 SURGICAL SUPPLY — 95 items
ADH SKN CLS APL DERMABOND .7 (GAUZE/BANDAGES/DRESSINGS) ×2
ADH SKN CLS LQ APL DERMABOND (GAUZE/BANDAGES/DRESSINGS) ×2
APL SRG 60D 8 XTD TIP BNDBL (TIP)
BAG COUNTER SPONGE SURGICOUNT (BAG) ×7 IMPLANT
BAG SPNG CNTER NS LX DISP (BAG) ×6
BASKET BONE COLLECTION (BASKET) ×3 IMPLANT
BIT DRILL LONG 3.0X30 (BIT) ×2 IMPLANT
BIT DRILL LONG 3X80 (BIT) IMPLANT
BIT DRILL LONG 4X80 (BIT) IMPLANT
BIT DRILL SHORT 3.0X30 (BIT) IMPLANT
BIT DRILL SHORT 3X80 (BIT) IMPLANT
BLADE CLIPPER SURG (BLADE) IMPLANT
BLADE SURG 11 STRL SS (BLADE) ×3 IMPLANT
BONE CANC CHIPS 20CC PCAN1/4 (Bone Implant) ×3 IMPLANT
BUR MATCHSTICK NEURO 3.0 LAGG (BURR) ×3 IMPLANT
CAGE PLIF 8X9X23-12 LUMBAR (Cage) ×6 IMPLANT
CANISTER SUCT 3000ML PPV (MISCELLANEOUS) ×3 IMPLANT
CEMENT KYPHON C01A KIT/MIXER (Cement) ×1 IMPLANT
CHIPS CANC BONE 20CC PCAN1/4 (Bone Implant) ×2 IMPLANT
CNTNR URN SCR LID CUP LEK RST (MISCELLANEOUS) ×2 IMPLANT
CONNECTOR RELINE 20MM OPEN OFF (Connector) ×1 IMPLANT
CONT SPEC 4OZ STRL OR WHT (MISCELLANEOUS) ×3
COVER BACK TABLE 60X90IN (DRAPES) ×3 IMPLANT
DECANTER SPIKE VIAL GLASS SM (MISCELLANEOUS) ×2 IMPLANT
DERMABOND ADHESIVE PROPEN (GAUZE/BANDAGES/DRESSINGS) ×1
DERMABOND ADVANCED (GAUZE/BANDAGES/DRESSINGS) ×1
DERMABOND ADVANCED .7 DNX12 (GAUZE/BANDAGES/DRESSINGS) ×2 IMPLANT
DERMABOND ADVANCED .7 DNX6 (GAUZE/BANDAGES/DRESSINGS) ×1 IMPLANT
DEVICE DISSECT PLASMABLAD 3.0S (MISCELLANEOUS) ×2 IMPLANT
DRAPE C-ARM 42X72 X-RAY (DRAPES) ×6 IMPLANT
DRAPE HALF SHEET 40X57 (DRAPES) IMPLANT
DRAPE LAPAROTOMY 100X72X124 (DRAPES) ×3 IMPLANT
DRAPE POUCH INSTRU U-SHP 10X18 (DRAPES) ×2 IMPLANT
DRAPE SHEET LG 3/4 BI-LAMINATE (DRAPES) ×4 IMPLANT
DRAPE WARM FLUID 44X44 (DRAPES) ×1 IMPLANT
DRSG OPSITE POSTOP 4X10 (GAUZE/BANDAGES/DRESSINGS) ×2 IMPLANT
DURAPREP 26ML APPLICATOR (WOUND CARE) ×3 IMPLANT
DURASEAL APPLICATOR TIP (TIP) IMPLANT
DURASEAL SPINE SEALANT 3ML (MISCELLANEOUS) IMPLANT
ELECT BLADE 4.0 EZ CLEAN MEGAD (MISCELLANEOUS)
ELECT REM PT RETURN 9FT ADLT (ELECTROSURGICAL) ×3
ELECTRODE BLDE 4.0 EZ CLN MEGD (MISCELLANEOUS) IMPLANT
ELECTRODE REM PT RTRN 9FT ADLT (ELECTROSURGICAL) ×2 IMPLANT
GAUZE 4X4 16PLY ~~LOC~~+RFID DBL (SPONGE) ×2 IMPLANT
GAUZE SPONGE 4X4 12PLY STRL (GAUZE/BANDAGES/DRESSINGS) ×1 IMPLANT
GLOVE SURG LTX SZ8.5 (GLOVE) ×6 IMPLANT
GLOVE SURG UNDER POLY LF SZ8.5 (GLOVE) ×6 IMPLANT
GOWN STRL REUS W/ TWL LRG LVL3 (GOWN DISPOSABLE) IMPLANT
GOWN STRL REUS W/ TWL XL LVL3 (GOWN DISPOSABLE) ×1 IMPLANT
GOWN STRL REUS W/TWL 2XL LVL3 (GOWN DISPOSABLE) ×6 IMPLANT
GOWN STRL REUS W/TWL LRG LVL3 (GOWN DISPOSABLE)
GOWN STRL REUS W/TWL XL LVL3 (GOWN DISPOSABLE) ×3
GRAFT BN 5X1XSPNE CVD POST DBM (Bone Implant) ×2 IMPLANT
GRAFT BNE CANC CHIPS 1-8 20CC (Bone Implant) IMPLANT
GRAFT BONE MAGNIFUSE 1X5CM (Bone Implant) ×3 IMPLANT
GRAFT BONE PROTEIOS LRG 5CC (Orthopedic Implant) ×1 IMPLANT
GUIDEWIRE NITINOL BEVEL TIP (WIRE) ×16 IMPLANT
HEMOSTAT POWDER KIT SURGIFOAM (HEMOSTASIS) ×4 IMPLANT
KIT BASIN OR (CUSTOM PROCEDURE TRAY) ×3 IMPLANT
KIT SPINE MAZOR X ROBO DISP (MISCELLANEOUS) ×3 IMPLANT
KIT TURNOVER KIT B (KITS) ×3 IMPLANT
MILL MEDIUM DISP (BLADE) ×3 IMPLANT
NDL RELINE-OR FENS 16 (NEEDLE) IMPLANT
NEEDLE HYPO 22GX1.5 SAFETY (NEEDLE) ×3 IMPLANT
NEEDLE RELINE-OR FENS 16 (NEEDLE) ×18 IMPLANT
NS IRRIG 1000ML POUR BTL (IV SOLUTION) ×6 IMPLANT
PACK LAMINECTOMY NEURO (CUSTOM PROCEDURE TRAY) ×3 IMPLANT
PAD ARMBOARD 7.5X6 YLW CONV (MISCELLANEOUS) ×9 IMPLANT
PATTIES SURGICAL .5 X1 (DISPOSABLE) ×3 IMPLANT
PIN HEAD 2.5X60MM (PIN) IMPLANT
PLASMABLADE 3.0S (MISCELLANEOUS) ×3
PUSHER RELINE FENS 36 OR (MISCELLANEOUS) ×6 IMPLANT
ROD RELINE 0-0 CON M 5.0/6.0MM (Rod) ×4 IMPLANT
ROD RELINE-O 5.5X300 STRT NS (Rod) IMPLANT
ROD RELINE-O 5.5X300MM STRT (Rod) ×6 IMPLANT
SCREW LOCK RELINE 5.5 TULIP (Screw) ×13 IMPLANT
SCREW RELINE PA 2FC 5.5X45 (Screw) ×8 IMPLANT
SCREW SCHANZ SA 4.0MM (MISCELLANEOUS) IMPLANT
SPONGE SURGIFOAM ABS GEL 100 (HEMOSTASIS) ×3 IMPLANT
SPONGE T-LAP 4X18 ~~LOC~~+RFID (SPONGE) ×4 IMPLANT
SUT PROLENE 6 0 BV (SUTURE) IMPLANT
SUT VIC AB 1 CT1 18XBRD ANBCTR (SUTURE) ×2 IMPLANT
SUT VIC AB 1 CT1 8-18 (SUTURE) ×6
SUT VIC AB 2-0 CP2 18 (SUTURE) ×6 IMPLANT
SUT VIC AB 3-0 SH 8-18 (SUTURE) ×5 IMPLANT
SUT VIC AB 4-0 RB1 18 (SUTURE) ×5 IMPLANT
SYR 3ML LL SCALE MARK (SYRINGE) ×12 IMPLANT
SYR 5ML LL (SYRINGE) ×1 IMPLANT
SYR CONTROL 10ML LL (SYRINGE) ×2 IMPLANT
TIP FENS REPLACE RELINE (MISCELLANEOUS) ×12 IMPLANT
TOWEL GREEN STERILE (TOWEL DISPOSABLE) ×3 IMPLANT
TOWEL GREEN STERILE FF (TOWEL DISPOSABLE) ×3 IMPLANT
TRAY FOLEY MTR SLVR 16FR STAT (SET/KITS/TRAYS/PACK) ×3 IMPLANT
TUBE MAZOR SA REDUCTION (TUBING) ×5 IMPLANT
WATER STERILE IRR 1000ML POUR (IV SOLUTION) ×3 IMPLANT

## 2020-12-22 NOTE — Transfer of Care (Signed)
Immediate Anesthesia Transfer of Care Note  Patient: [name]  Procedure(s) Performed: Lumbar one-two Posterior lumbar interbody fusion with stabilization from Thoracic ten to Lumbar one with robotic screw placement (Back) APPLICATION OF ROBOTIC ASSISTANCE FOR SPINAL PROCEDURE  Patient Location: PACU  Anesthesia Type:General  Level of Consciousness: awake and drowsy  Airway & Oxygen Therapy: Patient Spontanous Breathing and Patient connected to face mask oxygen  Post-op Assessment: Report given to RN, Post -op Vital signs reviewed and stable and Patient moving all extremities X 4  Post vital signs: Reviewed and stable  Last Vitals:  Vitals Value Taken Time  BP 97/62 12/22/20 1523  Temp 36.1 C 12/22/20 1523  Pulse 73 12/22/20 1528  Resp 19 12/22/20 1528  SpO2 95 % 12/22/20 1528  Vitals shown include unvalidated device data.  Last Pain:  Vitals:   12/22/20 0655  TempSrc:   PainSc: 0-No pain         Complications: No notable events documented.

## 2020-12-22 NOTE — H&P (Signed)
[name] is an 75 y.o. male.   Chief Complaint: Back pain bilateral leg pain and weakness HPI: [name] is a 75 year old individual whose had significant spondylitic stenosis in the past in the lumbar spine.  He underwent went decompression and fusion from L2-L5 number of years ago and had been improved for a period of time.  A number of months ago he had a fall and he sustained a fracture of the L1 vertebrae.  This appeared to be a fairly stable compression fracture of the L1 vertebrae and is treated conservatively.  Over time he has evolved some retropulsion of bone at L1 has a relative area of spinal canal stenosis at the L1 vertebrae with degeneration of the disc at L1 to also contributing to significant central canal stenosis.  Having failed efforts at conservative management he was advised regarding surgical decompression across the thoracolumbar junction and fixation to the T10 level to stabilize this region.  Patient has underlying osteoporosis that is being managed medically.  He is now admitted for that procedure.  Past Medical History:  Diagnosis Date   Allergy to alpha-gal    2015; has been able to re-introduce red meat for the past 2 1/2 years without reaction (as of 01/21/19)   Arthritis    Chest pain    a. 03/2017: echo showing EF of 60-65%, no regional WMA or significant valve abnormalities. b. 03/2016: NST with no evidence of ischemia.    Complication of anesthesia    "anaphylactic" reaction to Xylocaine > 10 years ago; reported later recieved marcaine without issue   Difficult intubation 02/19/2018   No difficulty 01/26/19 with Mac and 3 or 11/10/19 with Glidescope   Digestive problems    on Prednisone prn for this issue- diarrhea   Dyspnea    with activity   Dysrhythmia    irregular due to Myocarditis- takes Atenolol, Norvasc   GERD (gastroesophageal reflux disease)    H/O viral myocarditis    25 years ago   Head injury, closed, with concussion    Breif LOC    Hyperlipidemia    Mild mitral valve prolapse    per Dr Evette Georges notes   PAF (paroxysmal atrial fibrillation) ([place])    a. initially occuring in 02/2016. b. recurrent in 07/2016. Placed on Eliquis   Polymyalgia rheumatica (HCC)    Pre-diabetes    Pulmonary fibrosis (HCC)    Sleep apnea    mild sleep apnea   no CPAP    Past Surgical History:  Procedure Laterality Date   ANTERIOR CERVICAL DECOMP/DISCECTOMY FUSION N/A 11/10/2019   Procedure: Cervical Three-Four Anterior cervical decompression/discectomy/fusion;  Surgeon: [name], MD;  Location: [facility];  Service: Neurosurgery;  Laterality: N/A;  Cervical Three-Four Anterior cervical decompression/discectomy/fusion   apendectomy  1965   APPENDECTOMY     BACK SURGERY     bone spur Bilateral 1999   feet   CARDIAC CATHETERIZATION  2001   CHOLECYSTECTOMY  2004   COLON RESECTION N/A 06/15/2018   Procedure: SIGMOID COLON RESECTION;  Surgeon: [name], MD;  Location: [facility];  Service: General;  Laterality: N/A;   COLONOSCOPY     COLOSTOMY N/A 06/15/2018   Procedure: COLOSTOMY;  Surgeon: [name], MD;  Location: [facility];  Service: General;  Laterality: N/A;   COLOSTOMY TAKEDOWN N/A 01/26/2019   Procedure: LAPAROSCOPIC ASSISTED COLOSTOMY TAKEDOWN;  Surgeon: [name], MD;  Location: [facility];  Service: General;  Laterality: N/A;   EYE SURGERY Bilateral  cataract removal   LAPAROSCOPIC LYSIS OF ADHESIONS N/A 01/26/2019   Procedure: LAPAROSCOPIC LYSIS OF ADHESIONS;  Surgeon: [name], MD;  Location: [facility];  Service: General;  Laterality: N/A;   LAPAROTOMY N/A 06/15/2018   Procedure: EXPLORATORY LAPAROTOMY;  Surgeon: [name], MD;  Location: [facility];  Service: General;  Laterality: N/A;   LUMBAR LAMINECTOMY/ DECOMPRESSION WITH MET-RX Right 05/05/2013   Procedure: Right Lumbar three-four Extraforaminal Microdiskectomy with Metrex;  Surgeon: [name], MD;  Location: [facility];  Service: Neurosurgery;  Laterality: Right;   Right Lumbar three-four Extraforaminal Microdiskectomy with Metrex   LUNG BIOPSY Right 02/19/2018   Procedure: LUNG BIOPSY;  Surgeon: [name], MD;  Location: [facility];  Service: Thoracic;  Laterality: Right;   SHOULDER OPEN ROTATOR CUFF REPAIR Bilateral 2001   TONSILLECTOMY     VIDEO ASSISTED THORACOSCOPY Right 02/19/2018   Procedure: VIDEO ASSISTED THORACOSCOPY;  Surgeon: [name], MD;  Location: [facility];  Service: Thoracic;  Laterality: Right;    Family History  Problem Relation Age of Onset   Rheum arthritis Mother    Thyroid cancer Mother    Heart disease Father    Asthma Father    Thyroid cancer Sister    Melanoma Brother    Social History:  reports that he has never smoked. He has never used smokeless tobacco. He reports current alcohol use of about 21.0 standard drinks per week. He reports that he does not use drugs.  Allergies:  Allergies  Allergen Reactions   Xylocaine [Lidocaine] Anaphylaxis    Injection form   Codeine Nausea And Vomiting   Statins Other (See Comments)    Muscle soreness and an aching feeling all over Myalgias   Aldactone [Spironolactone]     Sore nipples and chest   Percocet [Oxycodone-Acetaminophen] Itching    Medications Prior to Admission  Medication Sig Dispense Refill   acetaminophen (TYLENOL) 650 MG CR tablet Take 1,300 mg by mouth every 8 (eight) hours as needed for pain.     budesonide-formoterol (SYMBICORT) 80-4.5 MCG/ACT inhaler Inhale 2 puffs into the lungs 2 (two) times daily as needed (shortness of breath).     Cholecalciferol (DIALYVITE VITAMIN D 5000) 125 MCG (5000 UT) capsule Take 5,000 Units by mouth daily.     diltiazem (CARDIZEM CD) 240 MG 24 hr capsule Take 1 capsule (240 mg total) by mouth daily. 90 capsule 3   diphenoxylate-atropine (LOMOTIL) 2.5-0.025 MG tablet Take 1 tablet by mouth 4 (four) times daily as needed for diarrhea or loose stools.     ELIQUIS 5 MG TABS tablet TAKE ONE TABLET (5MG TOTAL) BY  MOUTH TWODAILY (Patient taking differently: Take 5 mg by mouth 2 (two) times daily.) 180 tablet 1   ezetimibe (ZETIA) 10 MG tablet TAKE ONE (1) TABLET BY MOUTH EVERY DAY (Patient taking differently: Take 10 mg by mouth daily.) 90 tablet 3   fluticasone (FLONASE) 50 MCG/ACT nasal spray Place 2 sprays into both nostrils daily as needed for allergies or rhinitis.     gabapentin (NEURONTIN) 300 MG capsule Take 300 mg by mouth 2 (two) times daily.     hydroxychloroquine (PLAQUENIL) 200 MG tablet Take 200 mg by mouth 2 (two) times daily.     metoprolol tartrate (LOPRESSOR) 25 MG tablet TAKE ONE TABLET BY MOUTH TWICE A DAY (Patient taking differently: Take 25 mg by mouth 2 (two) times daily.) 180 tablet 3   ondansetron (ZOFRAN) 4 MG tablet Take 4 mg by mouth every  8 (eight) hours as needed for nausea or vomiting.      predniSONE (DELTASONE) 5 MG tablet Take 5 mg by mouth daily.      rosuvastatin (CRESTOR) 10 MG tablet Take 0.5 tablets (5 mg total) by mouth once a week. 20 tablet 0   tamsulosin (FLOMAX) 0.4 MG CAPS capsule Take 0.4 mg by mouth 2 (two) times daily.      Trolamine Salicylate (ASPERCREME EX) Apply 1 application topically daily as needed (arthritis pain).     cetirizine (ZYRTEC) 10 MG tablet Take 10 mg by mouth daily as needed for allergies.      nystatin (MYCOSTATIN) 100000 UNIT/ML suspension Take 5 mLs (500,000 Units total) by mouth every other day. (Patient not taking: No sig reported) 437 mL 11   spironolactone (ALDACTONE) 25 MG tablet Take 0.5 tablets (12.5 mg total) by mouth daily. (Patient not taking: No sig reported) 30 tablet 3    Results for orders placed or performed during the hospital encounter of 12/22/20 (from the past 48 hour(s))  Glucose, capillary     Status: Abnormal   Collection Time: 12/22/20  6:07 AM  Result Value Ref Range   Glucose-Capillary 122 (H) 70 - 99 mg/dL    Comment: Glucose reference range applies only to samples taken after fasting for at least 8 hours.    No results found.  Review of Systems  Constitutional:  Positive for activity change.  HENT: Negative.    Eyes: Negative.   Respiratory: Negative.    Cardiovascular: Negative.   Gastrointestinal: Negative.   Endocrine: Negative.   Genitourinary: Negative.   Musculoskeletal:  Positive for back pain, gait problem and myalgias.  Allergic/Immunologic:       History of polymyalgia rheumatica.  History of alpha gal allergy.  Neurological:  Positive for weakness and numbness.  Hematological: Negative.   Psychiatric/Behavioral: Negative.     Blood pressure (!) 90/59, pulse 67, temperature 97.7 F (36.5 C), temperature source Oral, resp. rate 17, height _0  (1.753 m), weight 83.9 kg, SpO2 93 %. Physical Exam Constitutional:      Appearance: Normal appearance. He is normal weight.  HENT:     Head: Normocephalic and atraumatic.     Right Ear: Tympanic membrane normal.     Left Ear: Tympanic membrane normal.     Nose: Nose normal.     Mouth/Throat:     Mouth: Mucous membranes are moist.  Eyes:     Extraocular Movements: Extraocular movements intact.     Pupils: Pupils are equal, round, and reactive to light.  Cardiovascular:     Rate and Rhythm: Normal rate and regular rhythm.     Pulses: Normal pulses.     Heart sounds: Normal heart sounds.  Pulmonary:     Effort: Pulmonary effort is normal.     Breath sounds: Normal breath sounds.  Abdominal:     General: Abdomen is flat.     Palpations: Abdomen is soft.  Musculoskeletal:        General: Normal range of motion.     Cervical back: Normal range of motion and neck supple.  Skin:    General: Skin is warm and dry.     Capillary Refill: Capillary refill takes less than 2 seconds.  Neurological:     Mental Status: He is alert.     Comments: Mild weakness in the iliopsoas and quadriceps bilaterally 4 out of 5 absent patellar reflexes 2+ Achilles reflexes.  Straight leg raising is positive at 30  degrees in either lower extremity.   Patrick's maneuver is negative bilaterally.  Upper extremity strength is intact as cranial nerve examination.  Psychiatric:        Mood and Affect: Mood normal.        Behavior: Behavior normal.        Thought Content: Thought content normal.        Judgment: Judgment normal.     Assessment/Plan Compression fracture of L1 with spinal stenosis that L1 in the L1-L2 interspace.  Plan: Decompression of T12 and L1 with posterior lateral arthrodesis and fixation from T10-L2.  Revision of the hardware to include fixation from T10 to the L2-L5 construct.  Earleen Newport, MD 12/22/2020, 7:49 AM

## 2020-12-22 NOTE — Progress Notes (Signed)
Orthopedic Tech Progress Note Patient Details:  [name] 1946-01-14 [id number]  Ortho Devices Type of Ortho Device: Lumbar corsett Ortho Device/Splint Interventions: Ordered      Danton Sewer A Holdan Stucke 12/22/2020, 8:39 PM

## 2020-12-22 NOTE — Anesthesia Procedure Notes (Signed)
Procedure Name: Intubation Date/Time: 12/22/2020 8:11 AM Performed by: Mariea Clonts, CRNA Pre-anesthesia Checklist: Patient identified, Emergency Drugs available, Suction available and Patient being monitored Patient Re-evaluated:Patient Re-evaluated prior to induction Oxygen Delivery Method: Circle System Utilized Preoxygenation: Pre-oxygenation with 100% oxygen Induction Type: IV induction Ventilation: Mask ventilation without difficulty and Oral airway inserted - appropriate to patient size Laryngoscope Size: Glidescope and 4 Grade View: Grade I Tube type: Oral Tube size: 8.0 mm Number of attempts: 1 Airway Equipment and Method: Stylet and Oral airway Placement Confirmation: ETT inserted through vocal cords under direct vision, positive ETCO2 and breath sounds checked- equal and bilateral Tube secured with: Tape Dental Injury: Teeth and Oropharynx as per pre-operative assessment

## 2020-12-22 NOTE — Op Note (Signed)
Date of surgery: 12/22/2020 Preoperative diagnosis: Spinal stenosis L1-L2 history of compression fracture of L1 history of fusion L2-L5 lumbar radiculopathy, neurogenic claudication. Postoperative diagnosis: Same Procedure: Posterior decompression L1-L2 with posterior lumbar interbody arthrodesis using peek spacers local autograft and allograft and more work than required for simple interbody fusion.  Stabilization of L1-L2 using pedicle screw fixation from T10-L2 and posterior and posterolateral arthrodesis from T10-L2 local autograft allograft and Proteus. Surgeon: [name] Anesthesia: General endotracheal Indications: [name] is a 75 year old individual who has had a previous decompression fusion from L2-L5.  He then developed fracture of the L1 vertebrae and this was initially treated conservatively he had an increase in pain and particularly left lower extremity pain was found to have a large herniated nucleus pulposus of the L1-L2 disc space on the left side he had undergone surgical decompression but has since incurred a recurrence of this process.  He has further kyphosis now developing at the L1 vertebrae and has been advised regarding surgical decompression stabilization of the L1 fracture with fixation from T10 to the L2 vertebrae is now admitted for that procedure.  Procedure: The patient was brought to the operating room supine on the stretcher.  After the smooth induction of general endotracheal anesthesia he was carefully turned prone onto the [facility] table that was appropriately padded and the shoulders chest and in the superior iliac crest.  The back was prepped with alcohol DuraPrep and draped in a sterile fashion the Mazor robot was attached to the [place] table.  An incision was made in the midline at the superiormost portion of his previous incision incision and this was carried down to the lumbodorsal fascia.  The L2 spinous process was identified positively and the pedicle  screws at L2 were identified to verify this positioning.  Then a laminectomy was carried out to T10 and a subperiosteal dissection was performed.  The robotic arm was then connected to the spinous process of L2 using a clamp and registration radiographs were obtained in AP and oblique views.  I preoperative program was reviewed and verified and the screws were modified particularly at the L1 vertebrae where the fracture was noted to have occurred.  Then the robotic plan was put into operation and 5.5 x 45 mm pedicle screws that were cannulated and hollow were placed in T10-T11-T12 and L1 bilaterally.  This was done using a standard Mazor protocol for open screw placement.  Verification x-rays were obtained which identified good position of the hardware in the respective vertebral bodies.  Then a laminectomy was performed after removing the robotic arm from the operating table this was done at the L1-L2 space and started on the left side where there was a large amount of recurrent disc material in the lateral recess along the pedicle of L2 compressing the common dural tube and the takeoff of the L2 nerve root once this was decompressed the disc space was entered at this space was noted to be significantly retrolisthesis at L1 on L2.  Careful dissection then yielded good decompression of the disc space and were able to restore alignment with reduction of some of the kyphosis.  Ultimately it was felt that a spacer to be required in the interspace and this was found to be an 8 x 9 x 23 mm spacer with 12 degrees lordosis these were packed with autograft allograft and Proteus and 9 cc of bone were packed into the interspace along with the 2 spacers.  Once this was accomplished augmentation with  methacrylate was performed on the T10-T11 and T12 screws.  This was done under fluoroscopic guidance 1 cc of cement was placed into the pedicle screws at each of these levels L1 was not augmented as the fracture fragment was  uncertain in its degree and completeness of healing we did not want to extravasate any cement.  Once the cement was placed the screws were checked further alignment and a 107 mm rod was contoured on the left side however a 107 mm straight rod was able to be fitted between a side connector placed just below L2 and the T10-T11-T12 screws the L1 screw was offset so far laterally that a side connector was used on that screw to connected to the rod in a neutral construct.  The system was then provisionally tightened and the contralateral rod on the left side was contoured to fit the saddles at T10-T11 T12-L1 and a side car connector at the level of the rod just below the L2 screw on that left side once these were fitted there were torqued down into their final position and final radiographs were obtained in AP and lateral projection.  The intertransverse space was decorticated between L2 and L1 and Magnifuse was placed in the intertransverse space this was 5 cc in each intertransverse space at L1-L2 lateral gutters of L1 and T12 were then decorticated and autograft allograft and Proteus was placed into this lateral gutter and posteriorly graft was placed between T10 and T11 and T12 after decorticating the laminar arches at the saddles of the facets.  With bone graft being packed into both sides hemostasis was checked in the soft tissues care was taken to make sure that the common dural tube remained intact throughout and was well decompressed and then the thoracodorsal fascia and lumbodorsal fascia was closed with #1 Vicryl interrupted fashion 2-0 Vicryl was used in subcutaneous tissues 3-0 Vicryl in the deep subcuticular layer and 4-0 Vicryl in a superficial subcuticular layer Dermabond was placed on the skin blood loss was estimated 500 cc and 220 cc of Cell Saver blood was returned to the patient.

## 2020-12-23 ENCOUNTER — Encounter (HOSPITAL_COMMUNITY): Payer: Self-pay | Admitting: Neurological Surgery

## 2020-12-23 LAB — CBC
HCT: 35.9 % — ABNORMAL LOW (ref 39.0–52.0)
Hemoglobin: 12.5 g/dL — ABNORMAL LOW (ref 13.0–17.0)
MCH: 36.2 pg — ABNORMAL HIGH (ref 26.0–34.0)
MCHC: 34.8 g/dL (ref 30.0–36.0)
MCV: 104.1 fL — ABNORMAL HIGH (ref 80.0–100.0)
Platelets: 150 10*3/uL (ref 150–400)
RBC: 3.45 MIL/uL — ABNORMAL LOW (ref 4.22–5.81)
RDW: 13 % (ref 11.5–15.5)
WBC: 8.6 10*3/uL (ref 4.0–10.5)
nRBC: 0 % (ref 0.0–0.2)

## 2020-12-23 LAB — BASIC METABOLIC PANEL
Anion gap: 9 (ref 5–15)
BUN: 5 mg/dL — ABNORMAL LOW (ref 8–23)
CO2: 27 mmol/L (ref 22–32)
Calcium: 8.4 mg/dL — ABNORMAL LOW (ref 8.9–10.3)
Chloride: 102 mmol/L (ref 98–111)
Creatinine, Ser: 1.18 mg/dL (ref 0.61–1.24)
GFR, Estimated: >60 mL/min (ref >60)
Glucose, Bld: 112 mg/dL — ABNORMAL HIGH (ref 70–99)
Potassium: 3.8 mmol/L (ref 3.5–5.1)
Sodium: 138 mmol/L (ref 135–145)

## 2020-12-23 MED ORDER — METHOCARBAMOL 500 MG PO TABS: 500.0000 mg | ORAL_TABLET | Freq: Four times a day (QID) | ORAL | 3 refills | Status: DC | PRN

## 2020-12-23 MED ORDER — HYDROXYZINE HCL 25 MG PO TABS: 25.0000 mg | ORAL_TABLET | Freq: Four times a day (QID) | ORAL | 0 refills | Status: DC | PRN

## 2020-12-23 MED ORDER — HYDROXYZINE HCL 25 MG PO TABS
25.0000 mg | ORAL_TABLET | Freq: Four times a day (QID) | ORAL | Status: DC | PRN
  Administered 2020-12-24: 25 mg via ORAL
  Filled 2020-12-23: qty 1

## 2020-12-23 MED ORDER — ONDANSETRON HCL 4 MG/2ML IJ SOLN
4.0000 mg | Freq: Four times a day (QID) | INTRAMUSCULAR | Status: DC | PRN
  Administered 2020-12-23: 4 mg via INTRAVENOUS
  Filled 2020-12-23: qty 2

## 2020-12-23 MED ORDER — OXYCODONE HCL 5 MG PO TABS: 5.0000 mg | ORAL_TABLET | ORAL | 0 refills | Status: DC | PRN

## 2020-12-23 MED ORDER — OXYCODONE HCL 5 MG PO TABS
5.0000 mg | ORAL_TABLET | ORAL | Status: DC | PRN
  Administered 2020-12-23 – 2020-12-24 (×3): 5 mg via ORAL
  Filled 2020-12-23 (×3): qty 1

## 2020-12-23 MED FILL — Thrombin For Soln 5000 Unit: CUTANEOUS | Qty: 5000 | Status: AC

## 2020-12-23 NOTE — Anesthesia Postprocedure Evaluation (Signed)
Anesthesia Post Note  Patient: [name]  Procedure(s) Performed: Lumbar one-two Posterior lumbar interbody fusion with stabilization from Thoracic ten to Lumbar one with robotic screw placement (Back) APPLICATION OF ROBOTIC ASSISTANCE FOR SPINAL PROCEDURE     Patient location during evaluation: PACU Anesthesia Type: General Level of consciousness: awake and alert Pain management: pain level controlled Vital Signs Assessment: post-procedure vital signs reviewed and stable Respiratory status: spontaneous breathing, nonlabored ventilation, respiratory function stable and patient connected to nasal cannula oxygen Cardiovascular status: blood pressure returned to baseline and stable Postop Assessment: no apparent nausea or vomiting Anesthetic complications: no   No notable events documented.  Last Vitals:  Vitals:   12/23/20 0402 12/23/20 0719  BP: 106/64 (!) 100/47  Pulse: 96 92  Resp: 20 16  Temp: 37.3 C 36.8 C  SpO2: 94% 92%    Last Pain:  Vitals:   12/23/20 0719  TempSrc: Oral  PainSc:                  [name] [name]

## 2020-12-23 NOTE — Discharge Summary (Addendum)
Physician Discharge Summary  [name] MRN: [id number] DOB/AGE: [birth year] 75 y.o.  Admit date: 12/22/2020 Discharge date: 12/24/2020  Admission Diagnoses: L1 compression fracture herniated nucleus pulposus L1-L2 lumbar spinal stenosis.  Neurogenic claudication.  Discharge Diagnoses: L1 compression fracture.  Herniated nucleus pulposus L1-L2.  Lumbar spinal stenosis.  Neurogenic claudication.  Lumbar radiculopathy. Active Problems:   Spinal stenosis of lumbar region with neurogenic claudication   Discharged Condition: fair  Hospital Course: Patient was admitted to undergo surgical decompression and stabilization from L1-L2 across the thoracolumbar junction to T10 tolerated surgery well.  Consults: None  Significant Diagnostic Studies: None  Treatments: surgery: See op note  Discharge Exam: Blood pressure (!) 108/55, pulse 95, temperature (!) 101 F (38.3 C), temperature source Oral, resp. rate 16, height '5\' 9"'$  (1.753 m), weight 83.9 kg, SpO2 92 %. Incision is clean and dry motor function is intact in the lower extremities.  Disposition: Discharge disposition: 01-Home or Self Care       Discharge Instructions     Call MD for:  redness, tenderness, or signs of infection (pain, swelling, redness, odor or green/yellow discharge around incision site)   Complete by: As directed    Call MD for:  severe uncontrolled pain   Complete by: As directed    Call MD for:  temperature >100.4   Complete by: As directed    Diet - low sodium heart healthy   Complete by: As directed    Discharge wound care:   Complete by: As directed    Okay to shower. Do not apply salves or appointments to incision. No heavy lifting with the upper extremities greater than 10 pounds. May resume driving when not requiring pain medication and patient feels comfortable with doing so.   Incentive spirometry RT   Complete by: As directed    Increase activity slowly   Complete by: As  directed       Allergies as of 12/23/2020       Reactions   Xylocaine [lidocaine] Anaphylaxis   Injection form   Codeine Nausea And Vomiting   Statins Other (See Comments)   Muscle soreness and an aching feeling all over Myalgias   Aldactone [spironolactone]    Sore nipples and chest   Alpha-gal    2015; has been able to re-introduce red meat for the past 2 1/2 years without reaction (as of 01/21/19)   Percocet [oxycodone-acetaminophen] Itching        Medication List     TAKE these medications    acetaminophen 650 MG CR tablet Commonly known as: TYLENOL Take 1,300 mg by mouth every 8 (eight) hours as needed for pain.   ASPERCREME EX Apply 1 application topically daily as needed (arthritis pain).   budesonide-formoterol 80-4.5 MCG/ACT inhaler Commonly known as: SYMBICORT Inhale 2 puffs into the lungs 2 (two) times daily as needed (shortness of breath).   cetirizine 10 MG tablet Commonly known as: ZYRTEC Take 10 mg by mouth daily as needed for allergies.   Dialyvite Vitamin D 5000 125 MCG (5000 UT) capsule Generic drug: Cholecalciferol Take 5,000 Units by mouth daily.   diltiazem 240 MG 24 hr capsule Commonly known as: CARDIZEM CD Take 1 capsule (240 mg total) by mouth daily.   diphenoxylate-atropine 2.5-0.025 MG tablet Commonly known as: LOMOTIL Take 1 tablet by mouth 4 (four) times daily as needed for diarrhea or loose stools.   Eliquis 5 MG Tabs tablet Generic drug: apixaban TAKE ONE TABLET ('5MG'$  TOTAL) BY MOUTH  TWODAILY What changed: See the new instructions.   ezetimibe 10 MG tablet Commonly known as: ZETIA TAKE ONE (1) TABLET BY MOUTH EVERY DAY What changed: how much to take   fluticasone 50 MCG/ACT nasal spray Commonly known as: FLONASE Place 2 sprays into both nostrils daily as needed for allergies or rhinitis.   gabapentin 300 MG capsule Commonly known as: NEURONTIN Take 300 mg by mouth 2 (two) times daily.   hydroxychloroquine 200 MG  tablet Commonly known as: PLAQUENIL Take 200 mg by mouth 2 (two) times daily.   hydrOXYzine 25 MG tablet Commonly known as: ATARAX/VISTARIL Take 1 tablet (25 mg total) by mouth every 6 (six) hours as needed for nausea, vomiting or itching.   methocarbamol 500 MG tablet Commonly known as: ROBAXIN Take 1 tablet (500 mg total) by mouth every 6 (six) hours as needed for muscle spasms.   metoprolol tartrate 25 MG tablet Commonly known as: LOPRESSOR TAKE ONE TABLET BY MOUTH TWICE A DAY   ondansetron 4 MG tablet Commonly known as: ZOFRAN Take 4 mg by mouth every 8 (eight) hours as needed for nausea or vomiting.   oxyCODONE 5 MG immediate release tablet Commonly known as: Oxy IR/ROXICODONE Take 1-2 tablets (5-10 mg total) by mouth every 3 (three) hours as needed for moderate pain or severe pain.   predniSONE 5 MG tablet Commonly known as: DELTASONE Take 5 mg by mouth daily.   rosuvastatin 10 MG tablet Commonly known as: CRESTOR Take 0.5 tablets (5 mg total) by mouth once a week.   tamsulosin 0.4 MG Caps capsule Commonly known as: FLOMAX Take 0.4 mg by mouth 2 (two) times daily.       ASK your doctor about these medications    nystatin 100000 UNIT/ML suspension Commonly known as: MYCOSTATIN Take 5 mLs (500,000 Units total) by mouth every other day.   spironolactone 25 MG tablet Commonly known as: ALDACTONE Take 0.5 tablets (12.5 mg total) by mouth daily.               Discharge Care Instructions  (From admission, onward)           Start     Ordered   12/23/20 0000  Discharge wound care:       Comments: Okay to shower. Do not apply salves or appointments to incision. No heavy lifting with the upper extremities greater than 10 pounds. May resume driving when not requiring pain medication and patient feels comfortable with doing so.   12/23/20 1931             Signed: Blanchie Dessert Zakyah Yanes 12/23/2020, 7:31 PM

## 2020-12-23 NOTE — Discharge Instructions (Signed)
Wound Care Remove outer dressing in 2 -3 days Leave incision open to air. You may shower. Do not scrub directly on incision.  Do not put any creams, lotions, or ointments on incision. Activity Walk each and every day, increasing distance each day. No lifting greater than 5 lbs.  Avoid excessive back motion. No driving for 2 weeks; may ride as a passenger locally.  Diet Resume your normal diet.   Call Your Doctor If Any of These Occur Redness, drainage, or swelling at the wound.  Temperature greater than 101 degrees. Severe pain not relieved by pain medication. Increased difficulty swallowing. Incision starts to come apart. Follow Up Appt Call today for appointment in 3-4 weeks [id number]) or for problems.  If you have any hardware placed in your spine, you will need an x-ray before your appointment.

## 2020-12-23 NOTE — Evaluation (Signed)
Physical Therapy Evaluation Patient Details Name: [name] MRN: [id number] DOB: Dec 23, 1945 Today's Date: 12/23/2020   History of Present Illness  75 yo M s/p PLIF. PMH including but not limited to pulmonary fibrosis and polymyalgia rheumatica.  Clinical Impression  Pt admitted with above diagnosis. At the time of PT eval, pt was able to demonstrate transfers and ambulation with gross min guard assist to min assist and RW for support. Pt with increased pain and nausea throughout session. Pt was educated on precautions, brace application/wearing schedule, appropriate activity progression, and car transfer. Pt currently with functional limitations due to the deficits listed below (see PT Problem List). Pt will benefit from skilled PT to increase their independence and safety with mobility to allow discharge to the venue listed below.      Follow Up Recommendations No PT follow up;Supervision for mobility/OOB    Equipment Recommendations  Rolling walker with 5" wheels    Recommendations for Other Services       Precautions / Restrictions Precautions Precautions: Back Precaution Booklet Issued: Yes (comment) Precaution Comments: reviewed Required Braces or Orthoses: Spinal Brace Spinal Brace: Lumbar corset Restrictions Weight Bearing Restrictions: No      Mobility  Bed Mobility Overal bed mobility: Needs Assistance Bed Mobility: Sit to Supine       Sit to supine: Min assist   General bed mobility comments: Assist required to transition to/from EOB. Pt moving slow and guarded due to pain.    Transfers Overall transfer level: Needs assistance Equipment used: Rolling walker (2 wheeled) Transfers: Sit to/from [facility] Sit to Stand: Min assist Stand pivot transfers: Min guard       General transfer comment: Assist to power up to full standing position and to gain/maintain standing balance with RW for support.  Ambulation/Gait Ambulation/Gait  assistance: Min guard;Min assist Gait Distance (Feet): 60 Feet Assistive device: Rolling walker (2 wheeled) Gait Pattern/deviations: Step-through pattern;Decreased stride length;Trunk flexed Gait velocity: Decreased Gait velocity interpretation: <1.31 ft/sec, indicative of household ambulator General Gait Details: VC's for improved posture, closer walker proximity, and forward gaze. Pt complaining of increased pain across his chest with scapular retraction and possible stretching of pecs.  Stairs            Wheelchair Mobility    Modified Rankin (Stroke Patients Only)       Balance Overall balance assessment: Needs assistance Sitting-balance support: Feet supported Sitting balance-Leahy Scale: Fair     Standing balance support: Bilateral upper extremity supported Standing balance-Leahy Scale: Poor Standing balance comment: Reliant on UE support throughout session. Ant/post sway noted in static standing during attempts to urinate.                             Pertinent Vitals/Pain Pain Assessment: 0-10 Pain Score: 9  Pain Location: Back and across chest (chest pain seems musculoskeletal and reproducable with position changes) Pain Descriptors / Indicators: Operative site guarding Pain Intervention(s): Limited activity within patient's tolerance;Monitored during session;Repositioned    Home Living Family/patient expects to be discharged to:: Private residence Living Arrangements: Spouse/significant other Available Help at Discharge: Family;Available 24 hours/day Type of Home: House Home Access: Stairs to enter Entrance Stairs-Rails: None Entrance Stairs-Number of Steps: 1 Home Layout: One level Home Equipment: Hand held shower head;Grab bars - tub/shower;Shower seat      Prior Function Level of Independence: Independent               Hand  Dominance   Dominant Hand: Right    Extremity/Trunk Assessment   Upper Extremity Assessment Upper  Extremity Assessment: Defer to OT evaluation    Lower Extremity Assessment Lower Extremity Assessment: Generalized weakness (Consistent with pre-op diagnosis)    Cervical / Trunk Assessment Cervical / Trunk Assessment: Other exceptions Cervical / Trunk Exceptions: spine surgery  Communication   Communication: No difficulties  Cognition Arousal/Alertness: Awake/alert Behavior During Therapy: WFL for tasks assessed/performed Overall Cognitive Status: Within Functional Limits for tasks assessed                                        General Comments      Exercises     Assessment/Plan    PT Assessment Patient needs continued PT services  PT Problem List Decreased strength;Decreased activity tolerance;Decreased balance;Decreased mobility;Decreased knowledge of use of DME;Decreased safety awareness;Decreased knowledge of precautions;Pain       PT Treatment Interventions DME instruction;Gait training;Functional mobility training;Therapeutic activities;Therapeutic exercise;Neuromuscular re-education;Patient/family education    PT Goals (Current goals can be found in the Care Plan section)  Acute Rehab PT Goals Patient Stated Goal: To return home tomorrow PT Goal Formulation: With patient/family Time For Goal Achievement: 12/30/20 Potential to Achieve Goals: Good    Frequency Min 5X/week   Barriers to discharge        Co-evaluation               AM-PAC PT "6 Clicks" Mobility  Outcome Measure Help needed turning from your back to your side while in a flat bed without using bedrails?: A Little Help needed moving from lying on your back to sitting on the side of a flat bed without using bedrails?: A Little Help needed moving to and from a bed to a chair (including a wheelchair)?: A Little Help needed standing up from a chair using your arms (e.g., wheelchair or bedside chair)?: A Little Help needed to walk in hospital room?: A Little Help needed  climbing 3-5 steps with a railing? : A Little 6 Click Score: 18    End of Session Equipment Utilized During Treatment: Gait belt Activity Tolerance: Patient tolerated treatment well Patient left: in bed;with call bell/phone within reach;with family/visitor present Nurse Communication: Mobility status PT Visit Diagnosis: Unsteadiness on feet (R26.81);Pain Pain - part of body:  (back, chest)    Time: [id number] PT Time Calculation (min) (ACUTE ONLY): 40 min   Charges:   PT Evaluation $PT Eval Low Complexity: 1 Low PT Treatments $Gait Training: 23-37 mins        Rolinda Roan, PT, DPT Acute Rehabilitation Services Pager: [phone] Office: [phone]   Thelma Comp 12/23/2020, 12:20 PM

## 2020-12-23 NOTE — Progress Notes (Signed)
[name], male   DOB: Sep 14, 1945, 75 y.o.   MRN: [id number] Vital signs are stable Motor function is good in the lower extremities Dressing is clean and dry Clinically patient is doing better today He had a difficult evening and rough morning but he is now more lucid Does not like the effect of the Dilaudid Will use some oxycodone without Tylenol and Vistaril for nausea if he has I will discharge him on this medication tomorrow

## 2020-12-23 NOTE — Evaluation (Signed)
Occupational Therapy Evaluation Patient Details Name: [name] MRN: [id number] DOB: 1945/09/30 Today's Date: 12/23/2020    History of Present Illness 75 yo M s/p PLIF. PMH including but not limited to pulmonary fibrosis and polymyalgia rheumatica.   Clinical Impression   Patient admitted for the diagnosis and procedure above.  PTA he lives with his spouse, who is able to assist as needed.  Patient was largely independent without any AD.  Barriers are listed below.  Currently he is very tired and having difficulty maintaining his LOA.  He is needing up to Min A for basic mobility and lower body ADL from a sit/stand level.  OT will continue to follow while he is in the acute setting to reinforce precautions, and maximize his functional status.      Follow Up Recommendations  No OT follow up    Equipment Recommendations  None recommended by OT    Recommendations for Other Services       Precautions / Restrictions Precautions Precautions: Back Precaution Booklet Issued: Yes (comment) Precaution Comments: reviewed Required Braces or Orthoses: Spinal Brace Spinal Brace: Lumbar corset Restrictions Weight Bearing Restrictions: No      Mobility Bed Mobility Overal bed mobility: Needs Assistance Bed Mobility: Sit to Supine       Sit to supine: Min assist     Patient Response: Flat affect  Transfers Overall transfer level: Needs assistance Equipment used: Rolling walker (2 wheeled) Transfers: Sit to/from [facility] Sit to Stand: Min assist Stand pivot transfers: Min guard            Balance Overall balance assessment: Needs assistance Sitting-balance support: Feet supported Sitting balance-[name] Scale: Good     Standing balance support: Bilateral upper extremity supported Standing balance-[name] Scale: Fair Standing balance comment: can static stand at RW level                           ADL either performed or assessed  with clinical judgement   ADL Overall ADL's : Needs assistance/impaired     Grooming: Wash/dry hands;Wash/dry face;Sitting;Set up           Upper Body Dressing : Minimal assistance;Sitting;Standing   Lower Body Dressing: Minimal assistance;Sit to/from stand               Functional mobility during ADLs: Min guard;Rolling walker       Vision Baseline Vision/History: Wears glasses Patient Visual Report: No change from baseline       Perception     Praxis      Pertinent Vitals/Pain Pain Assessment: 0-10 Pain Score: 6  Pain Location: whole back Pain Descriptors / Indicators: Operative site guarding Pain Intervention(s): Monitored during session     Hand Dominance Right   Extremity/Trunk Assessment Upper Extremity Assessment Upper Extremity Assessment: Generalized weakness   Lower Extremity Assessment Lower Extremity Assessment: Defer to PT evaluation   Cervical / Trunk Assessment Cervical / Trunk Assessment: Other exceptions Cervical / Trunk Exceptions: spine surgery   Communication Communication Communication: No difficulties   Cognition Arousal/Alertness: Awake/alert Behavior During Therapy: WFL for tasks assessed/performed Overall Cognitive Status: Within Functional Limits for tasks assessed  Home Living Family/patient expects to be discharged to:: Private residence Living Arrangements: Spouse/significant other Available Help at Discharge: Family;Available 24 hours/day Type of Home: House Home Access: Stairs to enter [employer] of Steps: 1 Entrance Stairs-Rails: None Home Layout: One level     Bathroom Shower/Tub: Occupational psychologist: Standard Bathroom Accessibility: Yes How Accessible: Accessible via walker Home Equipment: Hand held shower head;Grab bars - tub/shower;Shower seat          Prior Functioning/Environment Level of  Independence: Independent                 OT Problem List: Impaired balance (sitting and/or standing);Pain      OT Treatment/Interventions: Self-care/ADL training;Therapeutic activities;DME and/or AE instruction;Balance training    OT Goals(Current goals can be found in the care plan section) Acute Rehab OT Goals Patient Stated Goal: To return home tomorrow OT Goal Formulation: With patient Time For Goal Achievement: 01/06/21 Potential to Achieve Goals: Good ADL Goals Pt Will Perform Grooming: with modified independence;standing Pt Will Perform Lower Body Bathing: with modified independence;sit to/from stand Pt Will Perform Lower Body Dressing: with modified independence;sit to/from stand Pt Will Transfer to Toilet: with modified independence;regular height toilet;ambulating  OT Frequency: Min 2X/week   Barriers to D/C:    none noted       Co-evaluation              AM-PAC OT "6 Clicks" Daily Activity     Outcome Measure Help from another person eating meals?: None Help from another person taking care of personal grooming?: None Help from another person toileting, which includes using toliet, bedpan, or urinal?: A Little Help from another person bathing (including washing, rinsing, drying)?: A Little Help from another person to put on and taking off regular upper body clothing?: A Little Help from another person to put on and taking off regular lower body clothing?: A Little 6 Click Score: 20   End of Session Equipment Utilized During Treatment: Gait belt;Rolling walker Nurse Communication: Other (comment) (nausea)  Activity Tolerance: Patient tolerated treatment well Patient left: in bed;with call bell/phone within reach  OT Visit Diagnosis: Unsteadiness on feet (R26.81);Pain                Time: 0801-0819 OT Time Calculation (min): 18 min Charges:  OT General Charges $OT Visit: 1 Visit OT Evaluation $OT Eval Moderate Complexity: 1  Mod  12/23/2020  Rich, OTR/L  Acute Rehabilitation Services  Office:  [phone]   Metta Clines 12/23/2020, 8:46 AM

## 2020-12-24 NOTE — Progress Notes (Signed)
Occupational Therapy Treatment Patient Details Name: [name] MRN: [id number] DOB: June 04, 1945 Today's Date: 12/24/2020    History of present illness 75 yo M s/p PLIF. PMH including but not limited to pulmonary fibrosis and polymyalgia rheumatica.   OT comments  Patient with good progress toward all patient focused goals.  Able to complete ADL sit/stand with hip kit PRN.  His spouse will be at the home, and can assist as needed.  Mobility has improved, up to min A for bed mobility, and up walking with Mod I and RW for in room mobility.  No further acute OT needs.    Follow Up Recommendations  No OT follow up    Equipment Recommendations  None recommended by OT    Recommendations for Other Services      Precautions / Restrictions Precautions Precautions: Back Precaution Booklet Issued: Yes (comment) Required Braces or Orthoses: Spinal Brace Spinal Brace: Lumbar corset;Applied in sitting position Restrictions Weight Bearing Restrictions: No       Mobility Bed Mobility Overal bed mobility: Needs Assistance Bed Mobility: Sidelying to Sit   Sidelying to sit: Min assist         Patient Response: Cooperative  Transfers Overall transfer level: Needs assistance Equipment used: Rolling walker (2 wheeled) Transfers: Sit to/from [facility] Sit to Stand: Modified independent (Device/Increase time) Stand pivot transfers: Modified independent (Device/Increase time)            Balance   Sitting-balance support: Feet supported Sitting balance-Leahy Scale: Good     Standing balance support: Bilateral upper extremity supported Standing balance-Leahy Scale: Fair                             ADL either performed or assessed with clinical judgement   ADL Overall ADL's : Modified independent                                       General ADL Comments: Able to complete ADL from sit/stand level with AE.  His spouse will  be there to assist as needed.     Vision       Perception     Praxis      Cognition Arousal/Alertness: Awake/alert Behavior During Therapy: WFL for tasks assessed/performed Overall Cognitive Status: Within Functional Limits for tasks assessed                                                            Pertinent Vitals/ Pain       Pain Assessment: Faces Faces Pain Scale: Hurts a little bit Pain Location: Back and across chest (chest pain seems musculoskeletal and reproducable with position changes) Pain Descriptors / Indicators: Operative site guarding Pain Intervention(s): Monitored during session                                                          Frequency    D/c home       Progress Toward Goals  OT Goals(current  goals can now be found in the care plan section)  Progress towards OT goals: Progressing toward goals  Acute Rehab OT Goals Patient Stated Goal: Hoping to return home OT Goal Formulation: With patient Time For Goal Achievement: 01/06/21 Potential to Achieve Goals: Good  Plan All goals met and education completed, patient discharged from OT services    Co-evaluation                 AM-PAC OT "6 Clicks" Daily Activity     Outcome Measure   Help from another person eating meals?: None Help from another person taking care of personal grooming?: None Help from another person toileting, which includes using toliet, bedpan, or urinal?: None Help from another person bathing (including washing, rinsing, drying)?: A Little Help from another person to put on and taking off regular upper body clothing?: None Help from another person to put on and taking off regular lower body clothing?: A Little 6 Click Score: 22    End of Session Equipment Utilized During Treatment: Rolling walker  OT Visit Diagnosis: Unsteadiness on feet (R26.81);Pain   Activity Tolerance Patient tolerated treatment well    Patient Left in chair;with call bell/phone within reach   Nurse Communication          Time: 6004-5997 OT Time Calculation (min): 23 min  Charges: OT General Charges $OT Visit: 1 Visit OT Treatments $Therapeutic Activity: 23-37 mins  12/24/2020  Rich, OTR/L  Acute Rehabilitation Services  Office:  [phone]    Metta Clines 12/24/2020, 9:49 AM

## 2020-12-24 NOTE — Progress Notes (Signed)
Physical Therapy Treatment Patient Details Name: [name] MRN: [id number] DOB: 1946-03-18 Today's Date: 12/24/2020    History of Present Illness 75 yo M s/p PLIF. PMH including but not limited to pulmonary fibrosis and polymyalgia rheumatica.    PT Comments    Pt slowly progressing towards goals. Pt reliant on RW for safe ambulation in which pt was amb without AD PTA. Pt remains "whoozy" and mildly disoriented. Discussed car transfers, went over precautions, and stair negotiation with RW to enter/exit home. Recommend HHPT as pt remains unsafe to ambulate with assist or RW and pt was I PTA. Acute PT to cont to follow.    Follow Up Recommendations  Home health PT;Supervision/Assistance - 24 hour     Equipment Recommendations  Rolling walker with 5" wheels;3in1 (PT)    Recommendations for Other Services       Precautions / Restrictions Precautions Precautions: Back Precaution Booklet Issued: Yes (comment) Precaution Comments: reviewed, spouse with better recall, pt states "i'm still foggy from the medicine" Required Braces or Orthoses: Spinal Brace Spinal Brace: Lumbar corset;Applied in sitting position Restrictions Weight Bearing Restrictions: No    Mobility  Bed Mobility Overal bed mobility: Needs Assistance Bed Mobility: Sidelying to Sit   Sidelying to sit: Min assist       General bed mobility comments: pt sitting up in chair, PT demonstrated log roll technique in/out of bed to spouse and pt - verbal understanding given by spouse and pt    Transfers Overall transfer level: Needs assistance Equipment used: Rolling walker (2 wheeled) Transfers: Sit to/from Stand Sit to Stand: Min guard Stand pivot transfers: Modified independent (Device/Increase time)       General transfer comment: min guard to power up, increased time, verbal cues for hand placement, min guard to steady during transition of hands  Ambulation/Gait Ambulation/Gait assistance: Min  guard;Min assist Gait Distance (Feet): 70 Feet Assistive device: Rolling walker (2 wheeled) Gait Pattern/deviations: Step-through pattern;Decreased stride length;Trunk flexed Gait velocity: decreased Gait velocity interpretation: <1.31 ft/sec, indicative of household ambulator General Gait Details: pt with decreased step height with bilat foot scuffing requiring verbal cues to focus on clearing bilat feet, pt with quick fatigue, increased trunk flexion, v/c's to stay in walker/upright   Stairs Stairs:  (discussed with spouse and pt on walker placement on step when going in the house and stepping up with stronger leg (R) and then placing walker down and leading with L LE when leaving house to mimic home set up)           Wheelchair Mobility    Modified Rankin (Stroke Patients Only)       Balance Overall balance assessment: Needs assistance Sitting-balance support: Feet supported Sitting balance-Leahy Scale: Good     Standing balance support: Bilateral upper extremity supported Standing balance-Leahy Scale: Fair Standing balance comment: Reliant on UE support throughout session. Ant/post sway noted in static standing during attempts to urinate.                            Cognition Arousal/Alertness: Awake/alert Behavior During Therapy: WFL for tasks assessed/performed Overall Cognitive Status: Impaired/Different from baseline Area of Impairment: Problem solving                             Problem Solving: Slow processing General Comments: pt reporting "I"m still whoozy", wife reports "he still seems a little disoriented". pt slow to  responsd but able to follow commands      Exercises      General Comments General comments (skin integrity, edema, etc.): pt whoozy and mildly pale      Pertinent Vitals/Pain Pain Assessment: 0-10 Pain Score: 6  Faces Pain Scale: Hurts a little bit Pain Location: back Pain Descriptors / Indicators: Operative  site guarding Pain Intervention(s): Monitored during session    Home Living                      Prior Function            PT Goals (current goals can now be found in the care plan section) Acute Rehab PT Goals Patient Stated Goal: Hoping to return home Progress towards PT goals: Progressing toward goals    Frequency    Min 5X/week      PT Plan Discharge plan needs to be updated    Co-evaluation              AM-PAC PT "6 Clicks" Mobility   Outcome Measure  Help needed turning from your back to your side while in a flat bed without using bedrails?: A Little Help needed moving from lying on your back to sitting on the side of a flat bed without using bedrails?: A Little Help needed moving to and from a bed to a chair (including a wheelchair)?: A Little Help needed standing up from a chair using your arms (e.g., wheelchair or bedside chair)?: A Little Help needed to walk in hospital room?: A Little Help needed climbing 3-5 steps with a railing? : A Little 6 Click Score: 18    End of Session Equipment Utilized During Treatment: Gait belt Activity Tolerance: Patient tolerated treatment well Patient left: in chair;with call bell/phone within reach;with family/visitor present Nurse Communication: Mobility status PT Visit Diagnosis: Unsteadiness on feet (R26.81);Pain     Time: 1012-1023 PT Time Calculation (min) (ACUTE ONLY): 11 min  Charges:                        Kittie Plater, PT, DPT Acute Rehabilitation Services Pager #: [phone] Office #: [phone]    Berline Lopes 12/24/2020, 10:56 AM

## 2020-12-24 NOTE — Progress Notes (Signed)
Pt. discharged home accompanied by wife. Prescriptions and discharge instructions given with verbalization of understanding. Incision site on back with no s/s of infection - no swelling, redness, bleeding, and/or drainage noted. Opportunity given to ask questions but no question asked. Pt. transported out of this unit in wheelchair.

## 2020-12-24 NOTE — TOC Transition Note (Signed)
Transition of Care [facility]) - CM/SW Discharge Note   Patient Details  Name: [name] MRN: [id number] Date of Birth: 01-Jun-1945  Transition of Care [facility]) CM/SW Contact:  Bartholomew [name], RN Phone Number: [phone] 12/24/2020, 11:51 AM   Clinical Narrative:     Notified by nursing of patient needing HH PT/OT per MD. [facility] orders placed. Nursing stated that wife asked about [facility] for agency - [facility] not able to accept d/t staffing needs in patients location. [facility] accepted referral with anticipated start of care in 48 hours. No further TOC needs identified.   Final next level of care: Home w Home Health Services Barriers to Discharge: No Barriers Identified   Patient Goals and CMS Choice        Discharge Placement                       Discharge Plan and Services                          HH Arranged: PT, OT HH Agency: [facility] Date [place]: 12/24/20 Time HH Agency Contacted: 1130 Representative spoke with at [place]: [place] Determinants of Health ([place]) Interventions     Readmission Risk Interventions No flowsheet data found.

## 2020-12-27 DIAGNOSIS — E785 Hyperlipidemia, unspecified: Secondary | ICD-10-CM | POA: Diagnosis not present

## 2020-12-27 DIAGNOSIS — Z9049 Acquired absence of other specified parts of digestive tract: Secondary | ICD-10-CM | POA: Diagnosis not present

## 2020-12-27 DIAGNOSIS — Z981 Arthrodesis status: Secondary | ICD-10-CM | POA: Diagnosis not present

## 2020-12-27 DIAGNOSIS — I422 Other hypertrophic cardiomyopathy: Secondary | ICD-10-CM | POA: Diagnosis not present

## 2020-12-27 DIAGNOSIS — M8008XD Age-related osteoporosis with current pathological fracture, vertebra(e), subsequent encounter for fracture with routine healing: Secondary | ICD-10-CM | POA: Diagnosis not present

## 2020-12-27 DIAGNOSIS — J8410 Pulmonary fibrosis, unspecified: Secondary | ICD-10-CM | POA: Diagnosis not present

## 2020-12-27 DIAGNOSIS — Z7951 Long term (current) use of inhaled steroids: Secondary | ICD-10-CM | POA: Diagnosis not present

## 2020-12-27 DIAGNOSIS — I10 Essential (primary) hypertension: Secondary | ICD-10-CM | POA: Diagnosis not present

## 2020-12-27 DIAGNOSIS — M4712 Other spondylosis with myelopathy, cervical region: Secondary | ICD-10-CM | POA: Diagnosis not present

## 2020-12-27 DIAGNOSIS — G4733 Obstructive sleep apnea (adult) (pediatric): Secondary | ICD-10-CM | POA: Diagnosis not present

## 2020-12-27 DIAGNOSIS — I341 Nonrheumatic mitral (valve) prolapse: Secondary | ICD-10-CM | POA: Diagnosis not present

## 2020-12-27 DIAGNOSIS — M1990 Unspecified osteoarthritis, unspecified site: Secondary | ICD-10-CM | POA: Diagnosis not present

## 2020-12-27 DIAGNOSIS — M353 Polymyalgia rheumatica: Secondary | ICD-10-CM | POA: Diagnosis not present

## 2020-12-27 DIAGNOSIS — Z7901 Long term (current) use of anticoagulants: Secondary | ICD-10-CM | POA: Diagnosis not present

## 2020-12-27 DIAGNOSIS — K219 Gastro-esophageal reflux disease without esophagitis: Secondary | ICD-10-CM | POA: Diagnosis not present

## 2020-12-27 DIAGNOSIS — I480 Paroxysmal atrial fibrillation: Secondary | ICD-10-CM | POA: Diagnosis not present

## 2020-12-29 MED FILL — Heparin Sodium (Porcine) Inj 1000 Unit/ML: INTRAMUSCULAR | Qty: 30 | Status: AC

## 2020-12-29 MED FILL — Sodium Chloride IV Soln 0.9%: INTRAVENOUS | Qty: 1000 | Status: AC

## 2021-01-06 DIAGNOSIS — E785 Hyperlipidemia, unspecified: Secondary | ICD-10-CM | POA: Diagnosis not present

## 2021-01-06 DIAGNOSIS — G4733 Obstructive sleep apnea (adult) (pediatric): Secondary | ICD-10-CM | POA: Diagnosis not present

## 2021-01-06 DIAGNOSIS — M8008XD Age-related osteoporosis with current pathological fracture, vertebra(e), subsequent encounter for fracture with routine healing: Secondary | ICD-10-CM | POA: Diagnosis not present

## 2021-01-06 DIAGNOSIS — M1990 Unspecified osteoarthritis, unspecified site: Secondary | ICD-10-CM | POA: Diagnosis not present

## 2021-01-06 DIAGNOSIS — J8410 Pulmonary fibrosis, unspecified: Secondary | ICD-10-CM | POA: Diagnosis not present

## 2021-01-06 DIAGNOSIS — I422 Other hypertrophic cardiomyopathy: Secondary | ICD-10-CM | POA: Diagnosis not present

## 2021-01-06 DIAGNOSIS — M4712 Other spondylosis with myelopathy, cervical region: Secondary | ICD-10-CM | POA: Diagnosis not present

## 2021-01-06 DIAGNOSIS — Z9049 Acquired absence of other specified parts of digestive tract: Secondary | ICD-10-CM | POA: Diagnosis not present

## 2021-01-06 DIAGNOSIS — K219 Gastro-esophageal reflux disease without esophagitis: Secondary | ICD-10-CM | POA: Diagnosis not present

## 2021-01-06 DIAGNOSIS — Z7951 Long term (current) use of inhaled steroids: Secondary | ICD-10-CM | POA: Diagnosis not present

## 2021-01-06 DIAGNOSIS — M353 Polymyalgia rheumatica: Secondary | ICD-10-CM | POA: Diagnosis not present

## 2021-01-06 DIAGNOSIS — I480 Paroxysmal atrial fibrillation: Secondary | ICD-10-CM | POA: Diagnosis not present

## 2021-01-06 DIAGNOSIS — I341 Nonrheumatic mitral (valve) prolapse: Secondary | ICD-10-CM | POA: Diagnosis not present

## 2021-01-06 DIAGNOSIS — Z7901 Long term (current) use of anticoagulants: Secondary | ICD-10-CM | POA: Diagnosis not present

## 2021-01-06 DIAGNOSIS — I10 Essential (primary) hypertension: Secondary | ICD-10-CM | POA: Diagnosis not present

## 2021-01-06 DIAGNOSIS — Z981 Arthrodesis status: Secondary | ICD-10-CM | POA: Diagnosis not present

## 2021-01-11 DIAGNOSIS — S32010G Wedge compression fracture of first lumbar vertebra, subsequent encounter for fracture with delayed healing: Secondary | ICD-10-CM | POA: Diagnosis not present

## 2021-01-11 DIAGNOSIS — I2510 Atherosclerotic heart disease of native coronary artery without angina pectoris: Secondary | ICD-10-CM | POA: Diagnosis not present

## 2021-01-11 DIAGNOSIS — E1165 Type 2 diabetes mellitus with hyperglycemia: Secondary | ICD-10-CM | POA: Diagnosis not present

## 2021-01-26 DIAGNOSIS — Z7951 Long term (current) use of inhaled steroids: Secondary | ICD-10-CM | POA: Diagnosis not present

## 2021-01-26 DIAGNOSIS — J8410 Pulmonary fibrosis, unspecified: Secondary | ICD-10-CM | POA: Diagnosis not present

## 2021-01-26 DIAGNOSIS — M1990 Unspecified osteoarthritis, unspecified site: Secondary | ICD-10-CM | POA: Diagnosis not present

## 2021-01-26 DIAGNOSIS — Z7901 Long term (current) use of anticoagulants: Secondary | ICD-10-CM | POA: Diagnosis not present

## 2021-01-26 DIAGNOSIS — I422 Other hypertrophic cardiomyopathy: Secondary | ICD-10-CM | POA: Diagnosis not present

## 2021-01-26 DIAGNOSIS — I480 Paroxysmal atrial fibrillation: Secondary | ICD-10-CM | POA: Diagnosis not present

## 2021-01-26 DIAGNOSIS — I10 Essential (primary) hypertension: Secondary | ICD-10-CM | POA: Diagnosis not present

## 2021-01-26 DIAGNOSIS — G4733 Obstructive sleep apnea (adult) (pediatric): Secondary | ICD-10-CM | POA: Diagnosis not present

## 2021-01-26 DIAGNOSIS — M4712 Other spondylosis with myelopathy, cervical region: Secondary | ICD-10-CM | POA: Diagnosis not present

## 2021-01-26 DIAGNOSIS — K219 Gastro-esophageal reflux disease without esophagitis: Secondary | ICD-10-CM | POA: Diagnosis not present

## 2021-01-26 DIAGNOSIS — E785 Hyperlipidemia, unspecified: Secondary | ICD-10-CM | POA: Diagnosis not present

## 2021-01-26 DIAGNOSIS — Z9049 Acquired absence of other specified parts of digestive tract: Secondary | ICD-10-CM | POA: Diagnosis not present

## 2021-01-26 DIAGNOSIS — M8008XD Age-related osteoporosis with current pathological fracture, vertebra(e), subsequent encounter for fracture with routine healing: Secondary | ICD-10-CM | POA: Diagnosis not present

## 2021-01-26 DIAGNOSIS — Z981 Arthrodesis status: Secondary | ICD-10-CM | POA: Diagnosis not present

## 2021-01-26 DIAGNOSIS — I341 Nonrheumatic mitral (valve) prolapse: Secondary | ICD-10-CM | POA: Diagnosis not present

## 2021-01-26 DIAGNOSIS — M353 Polymyalgia rheumatica: Secondary | ICD-10-CM | POA: Diagnosis not present

## 2021-01-31 DIAGNOSIS — G4733 Obstructive sleep apnea (adult) (pediatric): Secondary | ICD-10-CM | POA: Diagnosis not present

## 2021-01-31 DIAGNOSIS — M353 Polymyalgia rheumatica: Secondary | ICD-10-CM | POA: Diagnosis not present

## 2021-01-31 DIAGNOSIS — E785 Hyperlipidemia, unspecified: Secondary | ICD-10-CM | POA: Diagnosis not present

## 2021-01-31 DIAGNOSIS — M4712 Other spondylosis with myelopathy, cervical region: Secondary | ICD-10-CM | POA: Diagnosis not present

## 2021-01-31 DIAGNOSIS — K219 Gastro-esophageal reflux disease without esophagitis: Secondary | ICD-10-CM | POA: Diagnosis not present

## 2021-01-31 DIAGNOSIS — J8410 Pulmonary fibrosis, unspecified: Secondary | ICD-10-CM | POA: Diagnosis not present

## 2021-01-31 DIAGNOSIS — Z9049 Acquired absence of other specified parts of digestive tract: Secondary | ICD-10-CM | POA: Diagnosis not present

## 2021-01-31 DIAGNOSIS — M8008XD Age-related osteoporosis with current pathological fracture, vertebra(e), subsequent encounter for fracture with routine healing: Secondary | ICD-10-CM | POA: Diagnosis not present

## 2021-01-31 DIAGNOSIS — I10 Essential (primary) hypertension: Secondary | ICD-10-CM | POA: Diagnosis not present

## 2021-01-31 DIAGNOSIS — M1990 Unspecified osteoarthritis, unspecified site: Secondary | ICD-10-CM | POA: Diagnosis not present

## 2021-01-31 DIAGNOSIS — Z7951 Long term (current) use of inhaled steroids: Secondary | ICD-10-CM | POA: Diagnosis not present

## 2021-01-31 DIAGNOSIS — I341 Nonrheumatic mitral (valve) prolapse: Secondary | ICD-10-CM | POA: Diagnosis not present

## 2021-01-31 DIAGNOSIS — Z7901 Long term (current) use of anticoagulants: Secondary | ICD-10-CM | POA: Diagnosis not present

## 2021-01-31 DIAGNOSIS — I480 Paroxysmal atrial fibrillation: Secondary | ICD-10-CM | POA: Diagnosis not present

## 2021-01-31 DIAGNOSIS — Z981 Arthrodesis status: Secondary | ICD-10-CM | POA: Diagnosis not present

## 2021-01-31 DIAGNOSIS — I422 Other hypertrophic cardiomyopathy: Secondary | ICD-10-CM | POA: Diagnosis not present

## 2021-02-02 ENCOUNTER — Telehealth: Payer: Self-pay | Admitting: Cardiovascular Disease

## 2021-02-02 NOTE — Telephone Encounter (Signed)
Spoke with pt regarding low blood pressures. Pt states that PT was out at his house on Monday (pt recently had back surgery-12/22/20) and checked his blood pressure which was 78/49, at this time pt did feel dizzy. Additional blood pressure readings 01/31/21- 92/59 and 90/58 later in the day, 02/01/21- 87/52, and today 115/65, and 112/66 right after he called the office. Pt confirms that he is taking all medication as prescribed and feels that he is eating well and hydrated. Pt states that he has not made any changes to his day nor has he felt sick recently. Will forward to pharm and provider for suggestions.

## 2021-02-02 NOTE — Telephone Encounter (Signed)
Patient's med list states he is not taking spironolactone. If he is taking this, I would have him hold spironolactone. If he is not taking spironolactone currently, then he should decrease his dilitazem to 120mg  daily (he will need new rx). He should call the clinic in 1 week with updated BP readings

## 2021-02-02 NOTE — Telephone Encounter (Signed)
Pt c/o BP issue: STAT if pt c/o blurred vision, one-sided weakness or slurred speech  1. What are your last 5 BP readings? 01/31/21 92/59 later that day 90/58, 02/01/21, 87/52 today 115/65  2. Are you having any other symptoms (ex. Dizziness, headache, blurred vision, passed out)? On Wednesday when it was 78/49. Patient felt dizzy   3. What is your BP issue? Patient running low.

## 2021-02-03 MED ORDER — DILTIAZEM HCL ER COATED BEADS 120 MG PO CP24: 120.0000 mg | ORAL_CAPSULE | Freq: Every day | ORAL | 3 refills | Status: DC

## 2021-02-03 NOTE — Telephone Encounter (Signed)
Spoke with pt regarding low blood pressure readings and medication change.  Confirmed with pt that he is no longer taking spironolactone. Per PharmD pt is to reduce diltiazem to 120mg  daily. New prescription sent. Pt instructed to continue blood pressure log and to call the clinic if he continues to experience low blood pressures.  Pt verbalizes understanding.

## 2021-02-08 DIAGNOSIS — Z6827 Body mass index (BMI) 27.0-27.9, adult: Secondary | ICD-10-CM | POA: Diagnosis not present

## 2021-02-08 DIAGNOSIS — M353 Polymyalgia rheumatica: Secondary | ICD-10-CM | POA: Diagnosis not present

## 2021-02-08 DIAGNOSIS — R768 Other specified abnormal immunological findings in serum: Secondary | ICD-10-CM | POA: Diagnosis not present

## 2021-02-08 DIAGNOSIS — J84112 Idiopathic pulmonary fibrosis: Secondary | ICD-10-CM | POA: Diagnosis not present

## 2021-02-08 DIAGNOSIS — M5136 Other intervertebral disc degeneration, lumbar region: Secondary | ICD-10-CM | POA: Diagnosis not present

## 2021-02-08 DIAGNOSIS — E663 Overweight: Secondary | ICD-10-CM | POA: Diagnosis not present

## 2021-02-08 DIAGNOSIS — Z7952 Long term (current) use of systemic steroids: Secondary | ICD-10-CM | POA: Diagnosis not present

## 2021-02-08 DIAGNOSIS — M8080XD Other osteoporosis with current pathological fracture, unspecified site, subsequent encounter for fracture with routine healing: Secondary | ICD-10-CM | POA: Diagnosis not present

## 2021-02-08 DIAGNOSIS — S32010D Wedge compression fracture of first lumbar vertebra, subsequent encounter for fracture with routine healing: Secondary | ICD-10-CM | POA: Diagnosis not present

## 2021-02-09 ENCOUNTER — Encounter: Payer: Self-pay | Admitting: Internal Medicine

## 2021-02-09 ENCOUNTER — Ambulatory Visit (INDEPENDENT_AMBULATORY_CARE_PROVIDER_SITE_OTHER): Payer: PPO | Admitting: Internal Medicine

## 2021-02-09 ENCOUNTER — Other Ambulatory Visit: Payer: Self-pay

## 2021-02-09 ENCOUNTER — Ambulatory Visit: Payer: PPO | Admitting: Internal Medicine

## 2021-02-09 VITALS — BP 118/68 | HR 69 | Temp 98.5°F | Ht 68.0 in | Wt 183.0 lb

## 2021-02-09 DIAGNOSIS — Z7184 Encounter for health counseling related to travel: Secondary | ICD-10-CM | POA: Diagnosis not present

## 2021-02-09 DIAGNOSIS — Z23 Encounter for immunization: Secondary | ICD-10-CM

## 2021-02-09 DIAGNOSIS — J849 Interstitial pulmonary disease, unspecified: Secondary | ICD-10-CM

## 2021-02-09 DIAGNOSIS — Z7185 Encounter for immunization safety counseling: Secondary | ICD-10-CM

## 2021-02-09 LAB — PULMONARY FUNCTION TEST
DL/VA % pred: 100 %
DL/VA: 4.06 ml/min/mmHg/L
DLCO cor % pred: 92 %
DLCO cor: 22.11 ml/min/mmHg
DLCO unc % pred: 86 %
DLCO unc: 20.68 ml/min/mmHg
FEF 25-75 Pre: 2.95 L/sec
FEF2575-%Pred-Pre: 137 %
FEV1-%Pred-Pre: 103 %
FEV1-Pre: 2.98 L
FEV1FVC-%Pred-Pre: 113 %
FEV6-%Pred-Pre: 96 %
FEV6-Pre: 3.61 L
FEV6FVC-%Pred-Pre: 106 %
FVC-%Pred-Pre: 90 %
FVC-Pre: 3.61 L
Pre FEV1/FVC ratio: 83 %
Pre FEV6/FVC Ratio: 100 %

## 2021-02-09 NOTE — Addendum Note (Signed)
Addended by: Lorretta Harp on: 02/09/2021 04:17 PM   Modules accepted: Orders

## 2021-02-09 NOTE — Progress Notes (Addendum)
Spirometry/DLCO performed today. 

## 2021-02-09 NOTE — Patient Instructions (Addendum)
Spirometry/DLCO performed today. 

## 2021-02-09 NOTE — Progress Notes (Signed)
Synopsis: Referred in November 2017 for evaluation of shortness of breath.  Found to have diffuse parenchymal lung disease of undetermined cause. An open lung biopsy performed on 02/19/2018 showed an early fibrotic process suggestive of UIP.  He only smoked cigarettes for about 3 to 6 months when he was a teenager.  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    OCt 2019   [name] returns to clinic today to discuss the results from his recent open lung biopsy which showed early fibrotic changes suggestive of UIP as well as emphysema.  He tells me today that he is slowly feeling better but he does still have some fatigue and shortness of breath.  He has not been exercising much lately.  He tried to go fishing yesterday and he says that any walking was quite difficult for him.  He says that he is not coughing too much right now.  He is taking prednisone still as directed by her clinic earlier this month and he is wondering what to do with that.  He is still taking a bronchodilator.  He tells me today that he only smokes cigarettes for about 3 to 6 months when he was a teenager.  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   OV March 2020 [name] has returned to clinic today to see me.  He recently was hospitalized for diverticulitis, perforated, requiring emergent surgery. He says that his wound is slowly healing. He resumed CellCept and prednisone per my recommendation. His wife says that his wound is healing fairly well.  His shortness of breath is at baseline.  No recent pneumonia or bronchitis.  He is taking his nintedanib.   [name] GENDER: male DOB: Oct 04, 1945, MRN: [id number]  Chief Complaint  Patient presents with   Follow-up    Patient has quit taking the [facility] and has felt a lot  better since then. Patient was taking Stiolto but got thrush and took some Nystatin and it resolved it. Patient needs back surgery in about 3-4 months.      OV 04/16/2019: [name] is a 75 year old gentleman who presents for follow-up of NSIP.  He has had worse symptoms since interrupting his CellCept and nintedanib for his colectomy reversal.  He had bleeding when he initially restarted these medicines a few weeks postop, which required stopping them again.  He restarted his nintedanib last week and CellCept with Bactrim about 2 weeks prior.  He has not had any additional bleeding.  No abdominal pain.  He feels better than he did before surgery.  He still has mild dyspnea, not worse than his last visit.  He notices it most when he is leaning over.  No other complaints.  His PCP did his 59-month lab work earlier this week, and he will call the office to request they send [place] results.   OV 03/19/2019: [name] is a 75 year old gentleman with history of ILD to NSIP and polymyalgia rheumatica on chronic immunosuppression who presents for follow-up.  He underwent colostomy reversal on 01/26/2019, for which his CellCept plus Bactrim and nintedanib were held due to concern for potential surgical complications.  Several weeks following surgery his medications were restarted, and within a day or 2 he developed rectal bleeding.  This continued for several days until  he notified us, and we instructed him to stop.  Within the next day or 2 the bleeding has stopped.  He notices that his breathing has started to get a little bit worse over the last few weeks, and he would like to restart his medications.  He feels like he cannot exert himself due to his mild dyspnea.  He denies abdominal pain, fevers, nausea, vomiting, diarrhea, constipation, or periodic rectal bleeding.  He is following up with Dr. Marlou Starks surgery tomorrow.  He had had a colonoscopy just prior to surgery, but is unsure if he had hemorrhoids.  He  follows with [name] in rheumatology.  His prednisone has been tapered down to 7 mg, which will be his dose for now.  There has been mention of possibly starting Plaquenil in the future for his polymyalgia rheumatica.  OV 01/21/19: [name] is a 75 year old gentleman with a history of fibrosing NSIP diagnosed on open lung biopsy in 2019 who presents for routine follow-up and preoperative evaluation before colostomy reversal next week.  He has a history of polymyalgia rheumatica for which she has been on chronic steroids.  He is followed by [name] from rheumatology.  Currently he is being titrated off his chronic prednisone, currently on 8 mg daily.  For his NSIP he is maintained on CellCept, nintedanib, and Bactrim 3 days a week.  He is doing well, better than last year from breathlessness standpoint.  He went hiking with his wife a few weeks ago, which he reports he would have been unable to do a year ago.  He has nausea and diarrhea associated with nintedanib, but he is able to tolerate these with the use of Zofran and Imodium.  No jaundice, itching, right upper quadrant pain.  In February 2020 he underwent emergency partial colectomy with diverting colostomy for perforated diverticulitis.  He is planning to undergo colostomy reversal next week.  In July he fell, causing an L1 compression fracture.  [name] is planning to do a bone mineral density study, and currently he is not on calcium and vitamin D supplements.  In a few months he is planning on having a small outpatient back surgery to address a bone spur that is causing radicular pain in his left leg.  Today he wants to discuss plans for his pulmonary meds perioperatively.  He has previously had symptomatic renal insufficiency when he tried to come off prednisone on his own too quickly.  During his VATS and colectomy he received 10 mg of dexamethasone in the OR, and he received low-dose Solu-Medrol postoperatively from his  colectomy.     OV 07/21/19: [name] is a 75 year old gentleman with a history of ILD- UIP vs NSIP on chronic Ofev, mycophenolate who presents for follow-up.  He required disruption of these medications for several months in late 2020 his colostomy reversal.  When the meds were restarted postoperatively he had persistent bleeding, leading to prolonged time off medications and restarting them sequentially.  He feels that his breathing has gotten worse over time since then, but is unsure if that was all due to being off the meds or if it is continued to decline since then.  His breathing is usually worse during warmer weather.  He has noticed that his activities are more limited than they used to be.  He recently had an outpatient back surgery where he had to have bone fragments removed that were pinching and nerves.  He was developing left lower extremity weakness preoperatively.  Due to ongoing nerve impingement that is presumed to be due to inflammation, he is currently on a prednisone taper.  Currently on 20 mg daily and will decrease to 15 mg daily tomorrow.  He is on chronic prednisone for polymyalgia rheumatica at 7 mg daily.  He has been on Ofev 100 mg twice daily.  He was initially started on 150 mg twice daily, but required decrease in his dose due to nausea, vomiting, diarrhea.  Over time the symptoms have improved.  He restarted his meds postoperatively in the fall 2020 he had symptoms short-term, but noticed improvement over time.  When he had GI symptoms at the higher dose, he had some improvement with Zofran and Phenergan.  He is currently not having blood in his stools or other issues related to surgery.    He and his wife are planning a cross-country trip with their RV to visit children and grandchildren later this spring where they will be gone for 2 months.  Reviewed rheumatology, Dr. Melissa Noon clinic note 06/15/2019.  Considering starting Plaquenil to reduce dose of prednisone.   OV  4?!5/21  [name] is a 75 year old gentleman with a history of IPF who presents for follow-up.  He is accompanied by his wife today.  He recently had a severe shortness of breath episode that was precipitated by being around a significant amount of sawdust.  He did well with Stiolto, but developed thrush, which has been an issue for him with inhalers in the past.  He is improved with nystatin.  He stopped his Ofev due to severe intractable nausea and vomiting.  He now feels much better and is back to normal.  His shortness of breath is persistent-he has trouble walking around the house.  He has continued on his Bactrim and CellCept plus prednisone for polymyalgia rheumatica.  He wants to know if he can stop his Eliquis to be able to take an NSAID for help with his joint pain.  He has new leg edema bilaterally, but no edema elsewhere.  No chest pain.       OV 10/22/2019  Subjective:  [name], male , DOB: [birth year] , age 41 y.o. , MRN: [id number] , ADDRESS: [place] [address]   PCP [name], MD Rheum - Dr Jannette Fogo 0Dr McQuaid -> Dr Ernest Mallick -> Dr Chase Caller  10/22/2019 -   Chief Complaint  Patient presents with   Follow-up    Pt being referred to Dr. Chase Caller by Dr. Carlis Abbott for ILD.  Pt states he has been doing good since last visit and states that he feels SOB is stable. Denies any complaints of cough or chest discomfort.  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  - in 2019 : considered progressive pre-bx (on PFT/CT per notes)  - in 2019 - clinical CTD ruled out by Dr. Esmeralda Arthur record review_  -High-resolution CT March 2021: Indeterminate for UIP without change since 2017  RX  -  Mid-endJan 2020 -s tarted cellcept/bactrim + ofev (course complicated early February 2020 by diverticulitis and intermittent side effects with stop start of the nintedanib)  Pathological tissue mild emphysema present on surgical lung biopsy October 2019 [not evident on pulmonary function testing]  - Alpha 1 - MM:  Oct 2019  -On inhaler therapy  -No obstruction seen on PFT or emphysema on CT   History of perforated sigmoid diverticulitis early February 2021 few weeks after starting nintedanib/CellCept  -Status post reversal of colostomy in September 2020   HPI [name] 75 y.o. -presents to the ILD center with the above history.  Is a transfer of care to the ILD center.  Recently his case was discussed in June 2021 with interstitial lung disease multidisciplinary conference.  His pathology though showed fibroblastic foci did have then reformed branching fibrosis.  There was ossification.  There is no honeycombing.  There was heterogeneity and fibroblastic foci suggestive of UIP.  Overall it was felt that he had unclassifiable ILD.  The radiologist felt his CT scan had not progressed between 2018 and 2021.  His pulmonary function test were variable.  Therefore it was recommended that he be seen at the ILD center.  His ILD diagnosis as above.  He tells me though that when he started the nintedanib he started feeling better.  Although at the same time or before that he was also committed to inhaler therapy based on pathological emphysema on his lung biopsy from 2019.  This is also helped.  Therefore he is a concomitant diagnosis COPD/emphysema.  He is currently here to discuss his future care options.  He tells me  that he has continued CellCept and Bactrim for his ILD since early 2020 except for interruptions due to surgery.  He has been off nintedanib but recently has rechallenged himself at 150 mg once daily for the last 1 month and he is tolerating it fine.  His rheumatologist is working down on his prednisone to 5 mg/day [this is for polymyalgia rheumatica].  He tells me he has upcoming spinal surgery to the C-spine and later to the L-spine.  He wants clearance for this.  Noted he is on anticoagulation with Eliquis and has had bleeding episode.  [place] [facility] Comprehensive ILD Questionnaire  Symptoms:    Overall he is reporting progressive dyspnea although the conference it was felt radiology did not change over time.  Is been going on insidious onset for the last 3 years.  Dyspnea present on exertion relieved by rest.    SYMPTOM SCALE - ILD 10/22/2019   O2 use ra  Shortness of Breath 0 -> 5 scale with 5 being worst (score 6 If unable to do)  At rest 1  Simple tasks - showers, clothes change, eating, shaving 3  Household (dishes, doing bed, laundry) 3  Shopping 3  Walking level at own pace 4  Walking up Stairs 5  Total (30-36) Dyspnea Score 19  How bad is your cough? 0  How bad is your fatigue yes  How bad is nausea yes  How bad is vomiting?  n  How bad is diarrhea? no  How bad is anxiety? x  How bad is depression x        Past Medical History : As above   ROS: Positive for fatigue and diffuse arthralgia.  No dysphagia.  No dry eyes.  No Raynaud's no weight loss.  No vomiting.  No rash   FAMILY HISTORY of LUNG DISEASE:  -Father had  asthma.  No COPD no sarcoid no cystic fibrosis no hypersensitive pneumonitis no autoimmune disease.  EXPOSURE HISTORY: Briefly smoked cigarettes for 3 years.  Otherwise never smoked  Cigarettes.  He did smoke marijuana very little in 1972 and never use cocaine.  No intravenous drug use     HOME and HOBBY DETAILS [place].  Age of the home is 56 years.  He has lived in the home for 30 years.  No dampness no mildew no mold no humidifier use no CPAP use no nebulizer use.  No steam iron use.  No Jacuzzi use.  There is a misting Fountain in the house because his wife likes it.  But there is no mold or mildew.  No pet gerbils.  No feather pillows.  No mold in the [facility] duct.  No music habits no guarding habits.  No flood of water damage.  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  In the past he is done woodwork, Brewing technologist work, Biomedical engineer.  He has been exposed to asbestos woodwork and furniture work.  During furniture work he got exposed to meth saline chloride.  He also is a [place] (27 items): CellCept, prednisone and nintedanib       Simple office walk 185 feet x  3 laps goal with forehead probe 10/22/2019   O2 used ra  Number laps completed 3  Comments about pace avg  Resting Pulse Ox/HR 98% and 90/min  Final Pulse Ox/HR 96% and 107/min  Desaturated </= 88% no  Desaturated <= 3% points no  Got Tachycardic >/= 90/min yes  Symptoms at end of test Mild dyspnea  Miscellaneous comments x       Results for ZAKARIYA, KNICKERBOCKER "SI" (MRN [id number]) as of 10/22/2019 16:07  Ref. Range 05/03/2016 16:34 10/26/2016 09:28 01/10/2018 11:01 07/15/2019 08:59  FVC-Pre Latest Units: L 3.55 4.21 3.52 3.58  FVC-%Pred-Pre Latest Units: % 80 96 82 88   Results for Barlowe, Dominion SILER "SI" (MRN [id number]) as of 10/22/2019 16:07  Ref. Range 05/03/2016 16:34 10/26/2016 09:28 01/10/2018 11:01 07/15/2019 08:59  TLC Latest Units: L 5.85 6.79 7.25 5.86  TLC % pred Latest Units: % 84 98 106 88   Results for Lindh, Jester SILER "SI" (MRN [id number]) as of 10/22/2019 16:07  Ref. Range 05/03/2016 16:34 10/26/2016 09:28 01/10/2018 11:01  07/15/2019 08:59  DLCO unc Latest Units: ml/min/mmHg 23.92 26.71 21.15 19.51  DLCO unc % pred Latest Units: % 75 84 68 80      ROS - per HPI  Results for KACI, FREEL SILER "SI" (MRN [id number]) as of 10/22/2019 16:07  Ref. Range 05/29/2016 10:05 10/10/2016 13:54 01/14/2018 09:50 01/14/2018 09:50 03/12/2018 11:24 6/10  Aspergillus flavus Unknown REPORT       Aspergillus Fumigatus Latest Ref Range: NEGATIVE     NEGATIVE    A fumigatus #1 Unknown REPORT       Aspergillus [place] Unknown REPORT       [place] Serum Latest Ref Range: NEGATIVE  REPORT   NEGATIVE    Apullulans Unknown REPORT       A-1 Antitrypsin, Ser Latest Ref Range: 83 - 199 mg/dL     125   Anti Nuclear Antibody (ANA) Latest Ref Range: NEGATIVE  NEG   NEGATIVE  CENTROMERE AB SCREEN Latest Ref Range: <1.0 NEG AI <1.0 NEG  <6.7 NEG     Cyclic Citrullin Peptide Ab Latest Units: UNITS <16   <16  < 16  RA Latex Turbid. Latest Ref Range: <14 IU/mL 24 (H)   38 (H)  32  B burgdorferi Ab IgG+IgM Latest Ref Range: <0.90 Index  <0.90      SSA (Ro) (ENA) Antibody, IgG Latest Ref Range: <1.0 NEG AI <1.0 NEG   <1.0 NEG    SSB (La) (ENA) Antibody, IgG Latest Ref Range: <1.0 NEG AI <1.0 NEG   <1.0 NEG    Scleroderma (Scl-70) (ENA) Antibody, IgG Latest Ref Range: <1.0 NEG AI <1.0 NEG  <1.0 NEG <1.0 NEG      IMPRESSION: COMPARISON:  CT chest, 12/06/2017, 06/13/2016, 04/02/2016   FINDINGS: Cardiovascular: Aortic atherosclerosis. Normal heart size. No pericardial effusion.   Mediastinum/Nodes: No enlarged mediastinal, hilar, or axillary lymph nodes. Thyroid gland, trachea, and esophagus demonstrate no significant findings.   Lungs/Pleura: Wedge resections of the right lung. Redemonstrated mild pulmonary fibrosis with a slight apical to basal gradient featuring tubular bronchiectasis in the lower lobes, peripheral irregular interstitial pulmonary opacity, and no evidence of bronchiolectasis or honeycombing. Fibrotic findings are  not significantly changed compared to prior examinations. No significant air trapping on expiratory phase examination. No pleural effusion or pneumothorax.  1. No significant change in mild pulmonary fibrosis with a slight apical to basal gradient featuring tubular bronchiectasis in the lower lobes, peripheral irregular interstitial pulmonary opacity, and no evidence of bronchiolectasis or honeycombing. No significant air trapping on expiratory phase examination. Findings remain consistent with "indeterminate for UIP" pattern fibrosis by ATS pulmonary fibrosis criteria. Findings are indeterminate for UIP per consensus guidelines: Diagnosis of Idiopathic Pulmonary Fibrosis: An Official ATS/ERS/JRS/ALAT Clinical Practice Guideline. [place], Iss 5, [phone], Jan 12 2017.   2.  Interval wedge biopsies of the right lung.   3.  Aortic Atherosclerosis (ICD10-I70.0).     Electronically Signed   By: Eddie Candle M.D.   On: 07/30/2019 16:33     OV 12/30/2019  Subjective:  [name], male , DOB: November 12, 1945 , age 42 y.o. , MRN: [id number] , ADDRESS: [place] [address]   12/30/2019 -   Chief Complaint  Patient presents with   Follow-up    reports feeling "about the same" since last visit. Last albuterol use was this past weekend   Follow-up unclassifiable interstitial lung disease with trace positive rheumatoid factor  HPI [name] 75 y.o. -presents for follow-up. Since his last visit we recheck his autoimmune panel. His rheumatoid factor is only trace positive. He had pulmonary function test today and is documented below the stable. His spring 2021 CT chest is stable. Overall stability for few years. He again tells me that he is much better than he was 18 months ago in terms of shortness of breath. Symptom scores document that. At this point in time he is off CellCept. He thinks his improvement is related to  starting nintedanib. His wife is here with him and I am meeting her for the first time. She did acknowledge that this potential that he is feeling better because back then when he had the PVCs of ILD diagnosed he was not in a good frame of mind. Because he was dealing with back and neck issues. This is because his pre and post nintedanib PFT shows stability. Since his last  visit he is off CellCept. He has had a C-spine surgery. His back surgery has been postponed. However he is dealing with significant diarrhea. The diarrhea is despite stopping CellCept. This is because of nintedanib. He currently has dropped his nintedanib to 100 mg once daily but despite that is still having diarrhea. He does take some coffee in the morning with sugar but despite that has diarrhea. He prefers that he stop the nintedanib but in the past he acknowledges that it helped him.     SYMPTOM SCALE - ILD 10/22/2019  12/30/2019   O2 use ra ra  Shortness of Breath 0 -> 5 scale with 5 being worst (score 6 If unable to do)   At rest 1 1  Simple tasks - showers, clothes change, eating, shaving 3 1  Household (dishes, doing bed, laundry) 3 2  Shopping 3 1  Walking level at own pace 4 2  Walking up Stairs 5 4  Total (30-36) Dyspnea Score 19 11  How bad is your cough? 0 0  How bad is your fatigue yes 3  How bad is nausea yes 3 ofev  How bad is vomiting?  n 0  How bad is diarrhea? no 3 ofev  How bad is anxiety? x 1  How bad is depression x 1     Simple office walk 185 feet x  3 laps goal with forehead probe 10/22/2019   O2 used ra  Number laps completed 3  Comments about pace avg  Resting Pulse Ox/HR 98% and 90/min  Final Pulse Ox/HR 96% and 107/min  Desaturated </= 88% no  Desaturated <= 3% points no  Got Tachycardic >/= 90/min yes  Symptoms at end of test Mild dyspnea  Miscellaneous comments x    PFT Results Latest Ref Rng & Units 12/30/2019 07/15/2019 01/10/2018 10/26/2016 05/03/2016  FVC-Pre L 3.56 3.58 3.52  4.21 3.55  FVC-Predicted Pre % 88 88 82 96 80  FVC-Post L - 3.60 3.53 4.08 3.65  FVC-Predicted Post % - 88 83 93 83  Pre FEV1/FVC % % 82 76 81 78 81  Post FEV1/FCV % % - 79 82 81 83  FEV1-Pre L 2.93 2.72 2.85 3.30 2.88  FEV1-Predicted Pre % 99 91 91 102 88  FEV1-Post L - 2.85 2.88 3.30 3.02  DLCO uncorrected ml/min/mmHg 20.72 19.51 21.15 26.71 23.92  DLCO UNC% % 86 80 68 84 75  DLCO corrected ml/min/mmHg 20.55 19.51 20.44 25.62 23.99  DLCO COR %Predicted % 85 80 65 80 75  DLVA Predicted % 92 90 81 87 91  TLC L - 5.86 7.25 6.79 5.85  TLC % Predicted % - 88 106 98 84  RV % Predicted % - 94 155 102 94     ROS - per HPI      OV 07/06/2020  Subjective:  [name], male , DOB: 1945-09-24 , age 66 y.o. , MRN: [id number] , ADDRESS: [place] [address] PCP [name], MD Patient Care Team: [name], MD as PCP - General (Internal Medicine) Troy Sine, MD as PCP - Cardiology (Cardiology)  This Provider for this visit: Treatment Team:  Attending Provider: Brand Males, MD    07/06/2020 -   Chief Complaint  Patient presents with   Follow-up    PFT performed today.  Pt states he feels like he might have declined some since last visit. Pt states his breathing is some worse. Denies any complaints of  cough.      HPI [name] 75 y.o. -returns for follow-up for his ILD.  He presents with his wife.  He is on supportive care.  He quit taking his CellCept and nintedanib in the summer 2021 following diarrhea in the setting of GI surgery and colostomy reversal.  He does not want to take these drugs anymore.  However he tells me that he is feeling more short of breath in the last 4 months.  Present on exertion relieved by rest.  His symptom score reflect worsening shortness of breath.  He continues to have some amount of diarrhea because of reversal of his colostomy.  He had a high-resolution CT chest that shows stability  in ILD per the radiologist since 2017.  His walking desaturation test is stable.  However his pulmonary function test shows confusing signal compared to 6 months or 1 year ago.  Is stable versus normal variation versus slight decline.  I personally visualized the images of the pulmonary function test and the grph with him.  He is open to getting his pulmonary pathology slides sent for third opinion at an outside institution.  We discussed antifibrotic's in case his fibrosis is getting worse.  He is definitely not interested in the other option of pirfenidone after hearing side effects of nausea, vomiting, weight loss, fatigue and possible diarrhea.  He is interested in drugs that may not have GI side effects.  These are available in clinical trials.  For which she will need an IPF diagnosis.  Therefore is interested in getting another opinion on the slides to see if you would qualify for trials in the future.  His wife does indicate that he did have an episode of chest pain many went outdoors a few months ago and was climbing a hill.  In fact the chest pain was so bad it was central that he had to come back.  They have not reported this to the cardiologist.  He has cardiology follow-up coming up with Dr. Claiborne Billings in May 2022.  He had normal stress test or low risk in 2017.  His echo was normal in May 2021.       IMPRESSION: HRCT  Feb 2022  COMPARISON:  07/30/2019, 04/02/2016   FINDINGS: Cardiovascular: Aortic atherosclerosis. Normal heart size. No pericardial effusion.   Mediastinum/Nodes: No enlarged mediastinal, hilar, or axillary lymph nodes. Thyroid gland, trachea, and esophagus demonstrate no significant findings.   Lungs/Pleura: Evidence of prior right lung wedge resections. No significant interval change in pattern of mild pulmonary fibrosis featuring irregular peripheral interstitial opacity and mild, tubular bronchiectasis at the lung basis without evidence of significant subpleural  bronchiolectasis or honeycombing. No significant air trapping on expiratory phase imaging. No pleural effusion or pneumothorax.    1. No significant interval change in pattern of mild pulmonary fibrosis featuring irregular peripheral interstitial opacity and mild, tubular bronchiectasis at the lung basis without evidence of significant subpleural bronchiolectasis or honeycombing. Findings remain consistent with an "indeterminate for UIP" pattern of fibrosis by pulmonary fibrosis criteria. Findings are indeterminate for UIP per consensus guidelines: Diagnosis of Idiopathic Pulmonary Fibrosis: An Official ATS/ERS/JRS/ALAT Clinical Practice Guideline. [place], Iss 5, [phone], Jan 12 2017. 2. Evidence of prior right lung wedge resections.   Aortic Atherosclerosis (ICD10-I70.0).    According to the radiologist the pulmonary fibrosis is stable between 2017 through February 2022 on CT scan of the chest   Plan -We discussed about starting the other  antifibrotic pirfenidone but given your history of GI side effects with nintedanib I respect your desire to refuse but continue with expectant follow-up  -We will send a message to Dr. Shelva Majestic to evaluate you for angina  -Once Dr. Claiborne Billings clears you I think you should join pulmonary rehabilitation  -I have sent a note to our pathologist to get another opinion from another institution on your lung pathology to see if you have undifferentiated connective tissue disease or UIP/IPF.  If you have UIP/IPF you might qualify for clinical trials with drugs that do not have GI side effects  Follow-up -Do spirometry and DLCO in 3 months =     OV 02/09/2021  Subjective:  [name], male , DOB: 02/02/1946 , age 84 y.o. , MRN: [id number] , ADDRESS: [place] [address] PCP [name], MD Patient Care Team: [name], MD as PCP - General (Internal Medicine) Troy Sine, MD as PCP - Cardiology (Cardiology)  This Provider for this visit: Treatment Team:  Attending Provider: Brand Males, MD    02/09/2021 -   Chief Complaint  Patient presents with   Follow-up    PFT performed today.  Pt states he recently had back surgery 12/22/20. States his breathing has been about the same.   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  - in 2019 : considered progressive pre-bx (on PFT/CT per notes)  - in 2019 - clinical CTD ruled out by Dr. Esmeralda Arthur record review_  -High-resolution CT March 2021: Indeterminate for UIP without change since 2017  RX  - Mid-endJan 2020 -s tarted cellcept/bactrim + ofev (course complicated early February 2020 by diverticulitis and intermittent side effects with stop start of the nintedanib)  - stoppe cellcept June 2021 due to diarrhea/immune suppressino  - off ofev aug 2021 due to severe diarrhea  Pathological tissue mild emphysema present on surgical lung biopsy October 2019 [not evident on pulmonary function testing]  - Alpha 1 - MM:  Oct 2019  -On inhaler therapy  -No obstruction seen on PFT or emphysema on CT   History of perforated sigmoid diverticulitis early February 2021 few weeks after starting nintedanib/CellCept  -Status post reversal of colostomy in September 2020  Bak fusio surgery summer 2022  HPI [name] 75 y.o. -follow-up unclassifiable ILD.  Last seen first in February  2022.  After that in July 2022 he saw a nurse practitioner for a preop clearance.  Since then he has had back fusion surgery.  He is slowly recovering from that.  He is here with his wife [name].  Dyspnea score is stable.  He is not interested in antifibrotic's given previous side effects.  Last visit in February 2022 I contacted pathology to get another opinion from the [facility].  This is because the 2019 pathology revealed grade at [facility] said unclassifiable ILD but also indicated that there might be UIP.  I was not sure.  Still waiting  on that interpretation in clinic.  I followed up with pathology about it nevertheless he is doing stable.  He will have a high-dose flu shot today.  He has never had COVID and wanted to know if he should get the COVID by Valent mRNA booster.  I replied in the affirmative.  Currently is dealing a lot with back issues and slowly getting more mobile.  He and his wife wanting to travel to [place] to visit family.  They asked about travel advice in the era of COVID.    SYMPTOM SCALE - ILD 10/22/2019  12/30/2019  07/06/2020  02/09/2021 Supporitive care. S/p back surgery. 183#  O2 use ra ra ra ra  Shortness of Breath 0 -> 5 scale with 5 being worst (score 6 If unable to do)     At rest 1 1 0 1  Simple tasks - showers, clothes change, eating, shaving $RemoveBeforeD'3 1 2 1  'EYmkTyTpSlPKBF$ Household (dishes, doing bed, laundry) $RemoveBefor'3 2 2 1  'jcUKkfodjozI$ Shopping $RemoveBefo'3 1 2 1  'RgQwIIzCSVM$ Walking level at own pace $Remove'4 2 3 3  'sVDgIHY$ Walking up Stairs $RemoveB'5 4 5 5  'uJlPSLCZ$ Total (30-36) Dyspnea Score $RemoveBefor'19 11 13 12  'apmXXtfUzfGV$ How bad is your cough? 0 0 0 0  How bad is your fatigue yes $RemoveBef'3 2 2  'apxaRezxqq$ How bad is nausea yes 3 ofev 1 1  How bad is vomiting?  n 0 0 0  How bad is diarrhea? no 3 ofev 1 - baseline from GI surgery 0  How bad is anxiety? x 1 0 1  How bad is depression x 1 0 1     Simple office walk 185 feet x  3 laps goal with forehead probe 10/22/2019  07/06/2020   O2 used ra ra  Number laps completed 3 3  Comments about pace avg avg  Resting Pulse Ox/HR 98%  and 90/min 98% and HR 78  Final Pulse Ox/HR 96% and 107/min 97% and HR 95  Desaturated </= 88% no   Desaturated <= 3% points no   Got Tachycardic >/= 90/min yes   Symptoms at end of test Mild dyspnea Mild dy spnea  Miscellaneous comments x     PFT  PFT Results Latest Ref Rng & Units 02/09/2021 07/06/2020 12/30/2019 07/15/2019 01/10/2018 10/26/2016 05/03/2016  FVC-Pre L 3.61 3.50 3.56 3.58 3.52 4.21 3.55  FVC-Predicted Pre % 90 86 88 88 82 96 80  FVC-Post L - 3.46 - 3.60 3.53 4.08 3.65  FVC-Predicted Post % - 85 - 88 83 93 83  Pre FEV1/FVC % % 83 81 82 76 81 78 81  Post FEV1/FCV % % - 84 - 79 82 81 83  FEV1-Pre L 2.98 2.82 2.93 2.72 2.85 3.30 2.88  FEV1-Predicted Pre % 103 96 99 91 91 102 88  FEV1-Post L - 2.89 - 2.85 2.88 3.30 3.02  DLCO uncorrected ml/min/mmHg 20.68 19.11 20.72 19.51 21.15 26.71 23.92  DLCO UNC% % 86 79 86 80 68 84 75  DLCO corrected ml/min/mmHg 22.11 19.11 20.55 19.51 20.44 25.62 23.99  DLCO COR %Predicted % 92 79 85 80 65 80 75  DLVA Predicted % 100 90 92 90 81 87 91  TLC L - 5.29 - 5.86 7.25 6.79 5.85  TLC % Predicted % - 79 - 88 106 98 84  RV % Predicted % - 71 - 94 155 102 94       has a past medical history of Allergy to alpha-gal, Arthritis, Chest pain, Complication of anesthesia, Difficult  intubation (02/19/2018), Digestive problems, Dyspnea, Dysrhythmia, GERD (gastroesophageal reflux disease), H/O viral myocarditis, Head injury, closed, with concussion, Hyperlipidemia, Mild mitral valve prolapse, PAF (paroxysmal atrial fibrillation) ([place]), Polymyalgia rheumatica ([place]), Pre-diabetes, Pulmonary fibrosis ([place]), and Sleep apnea.   reports that he has never smoked. He has never used smokeless tobacco.  Past Surgical History:  Procedure Laterality Date   ANTERIOR CERVICAL DECOMP/DISCECTOMY FUSION N/A 11/10/2019   Procedure: Cervical Three-Four Anterior cervical decompression/discectomy/fusion;  Surgeon: [name], MD;  Location: [place];  Service: Neurosurgery;   Laterality: N/A;  Cervical Three-Four Anterior cervical decompression/discectomy/fusion   apendectomy  1965   APPENDECTOMY     APPLICATION OF ROBOTIC ASSISTANCE FOR SPINAL PROCEDURE N/A 12/22/2020   Procedure: APPLICATION OF ROBOTIC ASSISTANCE FOR SPINAL PROCEDURE;  Surgeon: [name], MD;  Location: [place];  Service: Neurosurgery;  Laterality: N/A;   BACK SURGERY     bone spur Bilateral 1999   feet   CARDIAC CATHETERIZATION  2001   CHOLECYSTECTOMY  2004   COLON RESECTION N/A 06/15/2018   Procedure: SIGMOID COLON RESECTION;  Surgeon: [name], MD;  Location: [facility];  Service: General;  Laterality: N/A;   COLONOSCOPY     COLOSTOMY N/A 06/15/2018   Procedure: COLOSTOMY;  Surgeon: [name], MD;  Location: [facility];  Service: General;  Laterality: N/A;   COLOSTOMY TAKEDOWN N/A 01/26/2019   Procedure: LAPAROSCOPIC ASSISTED COLOSTOMY TAKEDOWN;  Surgeon: [name], MD;  Location: [place];  Service: General;  Laterality: N/A;   EYE SURGERY Bilateral    cataract removal   LAPAROSCOPIC LYSIS OF ADHESIONS N/A 01/26/2019   Procedure: LAPAROSCOPIC LYSIS OF ADHESIONS;  Surgeon: [name], MD;  Location: [place];  Service: General;  Laterality: N/A;   LAPAROTOMY N/A 06/15/2018   Procedure: EXPLORATORY LAPAROTOMY;  Surgeon: [name], MD;  Location: [facility];  Service: General;  Laterality: N/A;   LUMBAR LAMINECTOMY/ DECOMPRESSION WITH MET-RX Right 05/05/2013   Procedure: Right Lumbar three-four Extraforaminal Microdiskectomy with Metrex;  Surgeon: [name], MD;  [facility];  Service: Neurosurgery;  Laterality: Right;  Right Lumbar three-four Extraforaminal Microdiskectomy with Metrex   LUNG BIOPSY Right 02/19/2018   Procedure: LUNG BIOPSY;  Surgeon: [name], MD;  Location: [place];  Service: Thoracic;  Laterality: Right;   POSTERIOR LUMBAR FUSION 4 LEVEL N/A 12/22/2020   Procedure: Lumbar one-two Posterior lumbar interbody fusion with stabilization from Thoracic  ten to Lumbar one with robotic screw placement;  Surgeon: [name], MD;  Location: [place];  Service: Neurosurgery;  Laterality: N/A;  RM 20   SHOULDER OPEN ROTATOR CUFF REPAIR Bilateral 2001   TONSILLECTOMY     VIDEO ASSISTED THORACOSCOPY Right 02/19/2018   Procedure: VIDEO ASSISTED THORACOSCOPY;  Surgeon: [name], MD;  Location: [place];  Service: Thoracic;  Laterality: Right;    Allergies  Allergen Reactions   Xylocaine [Lidocaine] Anaphylaxis    Injection form   Codeine Nausea And Vomiting   Statins Other (See Comments)    Muscle soreness and an aching feeling all over Myalgias   Aldactone [Spironolactone]     Sore nipples and chest   Alpha-Gal     2015; has been able to re-introduce red meat for the past 2 1/2 years without reaction (as of 01/21/19)   Percocet [Oxycodone-Acetaminophen] Itching    Immunization History  Administered Date(s) Administered   Influenza Split 03/20/2016   Influenza, High Dose Seasonal PF 01/29/2018, 02/25/2019, 03/20/2019, 12/13/2019   Influenza,inj,Quad PF,6+ Mos  03/12/2017   Moderna Sars-Covid-2 Vaccination 06/04/2019, 07/10/2019, 03/18/2020   Pneumococcal Conjugate-13 01/10/2018    Family History  Problem Relation Age of Onset   Rheum arthritis Mother    Thyroid cancer Mother    Heart disease Father    Asthma Father    Thyroid cancer Sister    Melanoma Brother      Current Outpatient Medications:    acetaminophen (TYLENOL) 650 MG CR tablet, Take 1,300 mg by mouth every 8 (eight) hours as needed for pain., Disp: , Rfl:    budesonide-formoterol (SYMBICORT) 80-4.5 MCG/ACT inhaler, Inhale 2 puffs into the lungs 2 (two) times daily as needed (shortness of breath)., Disp: , Rfl:    cetirizine (ZYRTEC) 10 MG tablet, Take 10 mg by mouth daily as needed for allergies. , Disp: , Rfl:    Cholecalciferol (DIALYVITE VITAMIN D 5000) 125 MCG (5000 UT) capsule, Take 5,000 Units by mouth daily., Disp: , Rfl:    diltiazem (CARDIZEM CD) 120  MG 24 hr capsule, Take 1 capsule (120 mg total) by mouth daily., Disp: 90 capsule, Rfl: 3   diphenoxylate-atropine (LOMOTIL) 2.5-0.025 MG tablet, Take 1 tablet by mouth 4 (four) times daily as needed for diarrhea or loose stools., Disp: , Rfl:    ELIQUIS 5 MG TABS tablet, TAKE ONE TABLET ($RemoveBef'5MG'fjnciTYYlK$  TOTAL) BY MOUTH TWODAILY (Patient taking differently: Take 5 mg by mouth 2 (two) times daily.), Disp: 180 tablet, Rfl: 1   ezetimibe (ZETIA) 10 MG tablet, TAKE ONE (1) TABLET BY MOUTH EVERY DAY (Patient taking differently: Take 10 mg by mouth daily.), Disp: 90 tablet, Rfl: 3   fluticasone (FLONASE) 50 MCG/ACT nasal spray, Place 2 sprays into both nostrils daily as needed for allergies or rhinitis., Disp: , Rfl:    gabapentin (NEURONTIN) 300 MG capsule, Take 300 mg by mouth 2 (two) times daily., Disp: , Rfl:    hydroxychloroquine (PLAQUENIL) 200 MG tablet, Take 200 mg by mouth 2 (two) times daily., Disp: , Rfl:    metoprolol tartrate (LOPRESSOR) 25 MG tablet, TAKE ONE TABLET BY MOUTH TWICE A DAY (Patient taking differently: Take 25 mg by mouth 2 (two) times daily.), Disp: 180 tablet, Rfl: 3   nystatin (MYCOSTATIN) 100000 UNIT/ML suspension, Take 5 mLs (500,000 Units total) by mouth every other day., Disp: 437 mL, Rfl: 11   ondansetron (ZOFRAN) 4 MG tablet, Take 4 mg by mouth every 8 (eight) hours as needed for nausea or vomiting. , Disp: , Rfl:    predniSONE (DELTASONE) 5 MG tablet, Take 5 mg by mouth daily. , Disp: , Rfl:    tamsulosin (FLOMAX) 0.4 MG CAPS capsule, Take 0.4 mg by mouth 2 (two) times daily. , Disp: , Rfl:    Trolamine Salicylate (ASPERCREME EX), Apply 1 application topically daily as needed (arthritis pain)., Disp: , Rfl:    rosuvastatin (CRESTOR) 10 MG tablet, Take 0.5 tablets (5 mg total) by mouth once a week., Disp: 20 tablet, Rfl: 0      Objective:   Vitals:   02/09/21 1503  BP: 118/68  Pulse: 69  Temp: 98.5 F (36.9 C)  TempSrc: Oral  SpO2: 96%  Weight: 183 lb (83 kg)  Height: 5'  8" (1.727 m)    Estimated body mass index is 27.83 kg/m as calculated from the following:   Height as of this encounter: $RemoveBeforeD'5\' 8"'KdNHumwgBnbXiU$  (1.727 m).   Weight as of this encounter: 183 lb (83 kg).  $Rem'@WEIGHTCHANGE'uJlE$ @  [employer]   02/09/21 1503  Weight: 183 lb (83  kg)     Physical Exam    General: No distress. Looks well. HAs CANE.  Neuro: Alert and Oriented x 3. GCS 15. Speech normal Psych: Pleasant Resp:  Barrel Chest - no.  Wheeze - no, Crackles - mild, No overt respiratory distress CVS: Normal heart sounds. Murmurs - no Ext: Stigmata of Connective Tissue Disease - no. BACK BRACE and CANE HEENT: Normal upper airway. PEERL +. No post nasal drip        Assessment:       ICD-10-CM   1. ILD (interstitial lung disease) ([place])  J84.9     2. Vaccine counseling  Z71.85     3. Flu vaccine need  Z23     4. Travel advice encounter  Z71.84          Plan:     Patient Instructions   ILD (interstitial lung disease) ([place])  -Clinically interstitial lung disease is stable  Plan - Continue supportive care - Increase mobility once her back is healed - I have written to the pathologist who told me in February 2022 he will get a third opinion on the lung biopsy [facility] -Given stability no role for antifibrotic especially given previous side effects; will consider antifibrotic if you show progressive phenotype  . Vaccine counseling Flu vaccine need  -High-dose flu shot today - Get COVID mRNA biValent booster vaccine sometime after 1 week  Travel advice encounter to [place]  -Monitor the health of your back and making long plane trips - Given the fact you are on Eliquis low risk for pulmonary embolism from air travel - Continue masking and protocols avoiding indoor clustering in order to reduce the risk of respiratory virus infection with travel   Follow-up -Do spirometry and DLCO in 6 months -Return to see Dr. Chase Caller in 6 months or sooner if needed - 30 min  visit    SIGNATURE    Dr. Brand Males, M.D., F.C.C.P,  Pulmonary and Critical Care Medicine Staff Physician, [place] Director - Interstitial Lung Disease  Program  Pulmonary [place] at [place], [place], [address]  Pager: [phone], If no answer or between  15:00h - 7:00h: call [phone] Telephone: [phone]  3:54 PM 02/09/2021

## 2021-02-09 NOTE — Patient Instructions (Addendum)
  ILD (interstitial lung disease) ([place])  -Clinically interstitial lung disease is stable  Plan - Continue supportive care - Increase mobility once her back is healed - I have written to the pathologist who told me in February 2022 he will get a third opinion on the lung biopsy from [facility] -Given stability no role for antifibrotic especially given previous side effects; will consider antifibrotic if you show progressive phenotype  . Vaccine counseling Flu vaccine need  -High-dose flu shot today - Get COVID mRNA biValent booster vaccine sometime after 1 week  Travel advice encounter to [place]  -Monitor the health of your back and making long plane trips - Given the fact you are on Eliquis low risk for pulmonary embolism from air travel - Continue masking and protocols avoiding indoor clustering in order to reduce the risk of respiratory virus infection with travel   Follow-up -Do spirometry and DLCO in 6 months -Return to see Dr. Chase Caller in 6 months or sooner if needed - 30 min visit

## 2021-02-10 ENCOUNTER — Other Ambulatory Visit: Payer: Self-pay | Admitting: Cardiovascular Disease

## 2021-02-15 ENCOUNTER — Encounter (HOSPITAL_COMMUNITY): Payer: Self-pay

## 2021-02-16 DIAGNOSIS — N400 Enlarged prostate without lower urinary tract symptoms: Secondary | ICD-10-CM | POA: Diagnosis not present

## 2021-02-16 DIAGNOSIS — E782 Mixed hyperlipidemia: Secondary | ICD-10-CM | POA: Diagnosis not present

## 2021-02-20 DIAGNOSIS — I482 Chronic atrial fibrillation, unspecified: Secondary | ICD-10-CM | POA: Diagnosis not present

## 2021-02-20 DIAGNOSIS — E785 Hyperlipidemia, unspecified: Secondary | ICD-10-CM | POA: Diagnosis not present

## 2021-02-20 DIAGNOSIS — I10 Essential (primary) hypertension: Secondary | ICD-10-CM | POA: Diagnosis not present

## 2021-02-20 DIAGNOSIS — I2510 Atherosclerotic heart disease of native coronary artery without angina pectoris: Secondary | ICD-10-CM | POA: Diagnosis not present

## 2021-02-20 DIAGNOSIS — M25519 Pain in unspecified shoulder: Secondary | ICD-10-CM | POA: Diagnosis not present

## 2021-02-20 DIAGNOSIS — Z0001 Encounter for general adult medical examination with abnormal findings: Secondary | ICD-10-CM | POA: Diagnosis not present

## 2021-02-20 DIAGNOSIS — E1169 Type 2 diabetes mellitus with other specified complication: Secondary | ICD-10-CM | POA: Diagnosis not present

## 2021-02-20 DIAGNOSIS — G4730 Sleep apnea, unspecified: Secondary | ICD-10-CM | POA: Diagnosis not present

## 2021-02-20 DIAGNOSIS — J849 Interstitial pulmonary disease, unspecified: Secondary | ICD-10-CM | POA: Diagnosis not present

## 2021-02-20 DIAGNOSIS — M791 Myalgia, unspecified site: Secondary | ICD-10-CM | POA: Diagnosis not present

## 2021-02-20 DIAGNOSIS — M353 Polymyalgia rheumatica: Secondary | ICD-10-CM | POA: Diagnosis not present

## 2021-02-20 DIAGNOSIS — R0600 Dyspnea, unspecified: Secondary | ICD-10-CM | POA: Diagnosis not present

## 2021-02-21 DIAGNOSIS — Z7901 Long term (current) use of anticoagulants: Secondary | ICD-10-CM | POA: Diagnosis not present

## 2021-02-21 DIAGNOSIS — E785 Hyperlipidemia, unspecified: Secondary | ICD-10-CM | POA: Diagnosis not present

## 2021-02-21 DIAGNOSIS — Z9049 Acquired absence of other specified parts of digestive tract: Secondary | ICD-10-CM | POA: Diagnosis not present

## 2021-02-21 DIAGNOSIS — I341 Nonrheumatic mitral (valve) prolapse: Secondary | ICD-10-CM | POA: Diagnosis not present

## 2021-02-21 DIAGNOSIS — I10 Essential (primary) hypertension: Secondary | ICD-10-CM | POA: Diagnosis not present

## 2021-02-21 DIAGNOSIS — Z981 Arthrodesis status: Secondary | ICD-10-CM | POA: Diagnosis not present

## 2021-02-21 DIAGNOSIS — I480 Paroxysmal atrial fibrillation: Secondary | ICD-10-CM | POA: Diagnosis not present

## 2021-02-21 DIAGNOSIS — M8008XD Age-related osteoporosis with current pathological fracture, vertebra(e), subsequent encounter for fracture with routine healing: Secondary | ICD-10-CM | POA: Diagnosis not present

## 2021-02-21 DIAGNOSIS — J8410 Pulmonary fibrosis, unspecified: Secondary | ICD-10-CM | POA: Diagnosis not present

## 2021-02-21 DIAGNOSIS — M1990 Unspecified osteoarthritis, unspecified site: Secondary | ICD-10-CM | POA: Diagnosis not present

## 2021-02-21 DIAGNOSIS — G4733 Obstructive sleep apnea (adult) (pediatric): Secondary | ICD-10-CM | POA: Diagnosis not present

## 2021-02-21 DIAGNOSIS — K219 Gastro-esophageal reflux disease without esophagitis: Secondary | ICD-10-CM | POA: Diagnosis not present

## 2021-02-21 DIAGNOSIS — M4712 Other spondylosis with myelopathy, cervical region: Secondary | ICD-10-CM | POA: Diagnosis not present

## 2021-02-21 DIAGNOSIS — Z7951 Long term (current) use of inhaled steroids: Secondary | ICD-10-CM | POA: Diagnosis not present

## 2021-02-21 DIAGNOSIS — I422 Other hypertrophic cardiomyopathy: Secondary | ICD-10-CM | POA: Diagnosis not present

## 2021-02-21 DIAGNOSIS — M353 Polymyalgia rheumatica: Secondary | ICD-10-CM | POA: Diagnosis not present

## 2021-02-22 DIAGNOSIS — S32010G Wedge compression fracture of first lumbar vertebra, subsequent encounter for fracture with delayed healing: Secondary | ICD-10-CM | POA: Diagnosis not present

## 2021-05-12 DIAGNOSIS — I10 Essential (primary) hypertension: Secondary | ICD-10-CM | POA: Diagnosis not present

## 2021-05-12 DIAGNOSIS — E782 Mixed hyperlipidemia: Secondary | ICD-10-CM | POA: Diagnosis not present

## 2021-05-23 DIAGNOSIS — R42 Dizziness and giddiness: Secondary | ICD-10-CM | POA: Diagnosis not present

## 2021-05-23 DIAGNOSIS — H9193 Unspecified hearing loss, bilateral: Secondary | ICD-10-CM | POA: Diagnosis not present

## 2021-05-23 DIAGNOSIS — H9313 Tinnitus, bilateral: Secondary | ICD-10-CM | POA: Diagnosis not present

## 2021-05-24 DIAGNOSIS — Z6828 Body mass index (BMI) 28.0-28.9, adult: Secondary | ICD-10-CM | POA: Diagnosis not present

## 2021-05-24 DIAGNOSIS — M5126 Other intervertebral disc displacement, lumbar region: Secondary | ICD-10-CM | POA: Diagnosis not present

## 2021-05-26 DIAGNOSIS — E273 Drug-induced adrenocortical insufficiency: Secondary | ICD-10-CM | POA: Diagnosis not present

## 2021-05-26 DIAGNOSIS — E785 Hyperlipidemia, unspecified: Secondary | ICD-10-CM | POA: Diagnosis not present

## 2021-05-26 DIAGNOSIS — Z7951 Long term (current) use of inhaled steroids: Secondary | ICD-10-CM | POA: Diagnosis not present

## 2021-05-26 DIAGNOSIS — M353 Polymyalgia rheumatica: Secondary | ICD-10-CM | POA: Diagnosis not present

## 2021-05-26 DIAGNOSIS — E1159 Type 2 diabetes mellitus with other circulatory complications: Secondary | ICD-10-CM | POA: Diagnosis not present

## 2021-05-26 DIAGNOSIS — T380X5D Adverse effect of glucocorticoids and synthetic analogues, subsequent encounter: Secondary | ICD-10-CM | POA: Diagnosis not present

## 2021-05-26 DIAGNOSIS — R42 Dizziness and giddiness: Secondary | ICD-10-CM | POA: Diagnosis not present

## 2021-05-26 DIAGNOSIS — I480 Paroxysmal atrial fibrillation: Secondary | ICD-10-CM | POA: Diagnosis not present

## 2021-05-26 DIAGNOSIS — J449 Chronic obstructive pulmonary disease, unspecified: Secondary | ICD-10-CM | POA: Diagnosis not present

## 2021-05-26 DIAGNOSIS — D6869 Other thrombophilia: Secondary | ICD-10-CM | POA: Diagnosis not present

## 2021-05-26 DIAGNOSIS — Z7952 Long term (current) use of systemic steroids: Secondary | ICD-10-CM | POA: Diagnosis not present

## 2021-05-26 DIAGNOSIS — Z7901 Long term (current) use of anticoagulants: Secondary | ICD-10-CM | POA: Diagnosis not present

## 2021-06-06 DIAGNOSIS — X32XXXD Exposure to sunlight, subsequent encounter: Secondary | ICD-10-CM | POA: Diagnosis not present

## 2021-06-06 DIAGNOSIS — L820 Inflamed seborrheic keratosis: Secondary | ICD-10-CM | POA: Diagnosis not present

## 2021-06-06 DIAGNOSIS — L570 Actinic keratosis: Secondary | ICD-10-CM | POA: Diagnosis not present

## 2021-06-19 DIAGNOSIS — E785 Hyperlipidemia, unspecified: Secondary | ICD-10-CM | POA: Diagnosis not present

## 2021-06-19 DIAGNOSIS — E1169 Type 2 diabetes mellitus with other specified complication: Secondary | ICD-10-CM | POA: Diagnosis not present

## 2021-06-23 DIAGNOSIS — M25519 Pain in unspecified shoulder: Secondary | ICD-10-CM | POA: Diagnosis not present

## 2021-06-23 DIAGNOSIS — G4730 Sleep apnea, unspecified: Secondary | ICD-10-CM | POA: Diagnosis not present

## 2021-06-23 DIAGNOSIS — G4700 Insomnia, unspecified: Secondary | ICD-10-CM | POA: Diagnosis not present

## 2021-06-23 DIAGNOSIS — M791 Myalgia, unspecified site: Secondary | ICD-10-CM | POA: Diagnosis not present

## 2021-06-23 DIAGNOSIS — E1169 Type 2 diabetes mellitus with other specified complication: Secondary | ICD-10-CM | POA: Diagnosis not present

## 2021-06-23 DIAGNOSIS — I482 Chronic atrial fibrillation, unspecified: Secondary | ICD-10-CM | POA: Diagnosis not present

## 2021-06-23 DIAGNOSIS — I10 Essential (primary) hypertension: Secondary | ICD-10-CM | POA: Diagnosis not present

## 2021-06-23 DIAGNOSIS — R0600 Dyspnea, unspecified: Secondary | ICD-10-CM | POA: Diagnosis not present

## 2021-06-23 DIAGNOSIS — J849 Interstitial pulmonary disease, unspecified: Secondary | ICD-10-CM | POA: Diagnosis not present

## 2021-06-23 DIAGNOSIS — M353 Polymyalgia rheumatica: Secondary | ICD-10-CM | POA: Diagnosis not present

## 2021-06-23 DIAGNOSIS — I2510 Atherosclerotic heart disease of native coronary artery without angina pectoris: Secondary | ICD-10-CM | POA: Diagnosis not present

## 2021-06-23 DIAGNOSIS — E785 Hyperlipidemia, unspecified: Secondary | ICD-10-CM | POA: Diagnosis not present

## 2021-06-29 ENCOUNTER — Other Ambulatory Visit (HOSPITAL_COMMUNITY): Payer: Self-pay | Admitting: Internal Medicine

## 2021-06-29 DIAGNOSIS — N63 Unspecified lump in unspecified breast: Secondary | ICD-10-CM

## 2021-07-06 ENCOUNTER — Other Ambulatory Visit: Payer: Self-pay | Admitting: Cardiovascular Disease

## 2021-07-24 ENCOUNTER — Other Ambulatory Visit: Payer: Self-pay

## 2021-07-24 ENCOUNTER — Ambulatory Visit (HOSPITAL_COMMUNITY)
Admission: RE | Admit: 2021-07-24 | Discharge: 2021-07-24 | Disposition: A | Discharge status: Home / Self Care | Payer: PPO | Source: Ambulatory Visit | Attending: Internal Medicine | Admitting: Internal Medicine

## 2021-07-24 ENCOUNTER — Encounter (HOSPITAL_COMMUNITY): Payer: Self-pay

## 2021-07-24 DIAGNOSIS — R928 Other abnormal and inconclusive findings on diagnostic imaging of breast: Secondary | ICD-10-CM | POA: Diagnosis not present

## 2021-07-24 DIAGNOSIS — N63 Unspecified lump in unspecified breast: Secondary | ICD-10-CM

## 2021-07-24 DIAGNOSIS — N6489 Other specified disorders of breast: Secondary | ICD-10-CM | POA: Diagnosis not present

## 2021-07-24 DIAGNOSIS — N62 Hypertrophy of breast: Secondary | ICD-10-CM | POA: Insufficient documentation

## 2021-08-03 DIAGNOSIS — M5416 Radiculopathy, lumbar region: Secondary | ICD-10-CM | POA: Diagnosis not present

## 2021-08-03 DIAGNOSIS — M5116 Intervertebral disc disorders with radiculopathy, lumbar region: Secondary | ICD-10-CM | POA: Diagnosis not present

## 2021-08-09 DIAGNOSIS — R768 Other specified abnormal immunological findings in serum: Secondary | ICD-10-CM | POA: Diagnosis not present

## 2021-08-09 DIAGNOSIS — J84112 Idiopathic pulmonary fibrosis: Secondary | ICD-10-CM | POA: Diagnosis not present

## 2021-08-09 DIAGNOSIS — Z7952 Long term (current) use of systemic steroids: Secondary | ICD-10-CM | POA: Diagnosis not present

## 2021-08-09 DIAGNOSIS — M8080XD Other osteoporosis with current pathological fracture, unspecified site, subsequent encounter for fracture with routine healing: Secondary | ICD-10-CM | POA: Diagnosis not present

## 2021-08-09 DIAGNOSIS — S32010D Wedge compression fracture of first lumbar vertebra, subsequent encounter for fracture with routine healing: Secondary | ICD-10-CM | POA: Diagnosis not present

## 2021-08-09 DIAGNOSIS — Z6828 Body mass index (BMI) 28.0-28.9, adult: Secondary | ICD-10-CM | POA: Diagnosis not present

## 2021-08-09 DIAGNOSIS — M5136 Other intervertebral disc degeneration, lumbar region: Secondary | ICD-10-CM | POA: Diagnosis not present

## 2021-08-09 DIAGNOSIS — E663 Overweight: Secondary | ICD-10-CM | POA: Diagnosis not present

## 2021-08-09 DIAGNOSIS — M353 Polymyalgia rheumatica: Secondary | ICD-10-CM | POA: Diagnosis not present

## 2021-08-17 DIAGNOSIS — M8080XD Other osteoporosis with current pathological fracture, unspecified site, subsequent encounter for fracture with routine healing: Secondary | ICD-10-CM | POA: Diagnosis not present

## 2021-08-21 DIAGNOSIS — R059 Cough, unspecified: Secondary | ICD-10-CM | POA: Diagnosis not present

## 2021-08-23 DIAGNOSIS — M5416 Radiculopathy, lumbar region: Secondary | ICD-10-CM | POA: Diagnosis not present

## 2021-08-23 DIAGNOSIS — Z6827 Body mass index (BMI) 27.0-27.9, adult: Secondary | ICD-10-CM | POA: Diagnosis not present

## 2021-08-23 DIAGNOSIS — M5126 Other intervertebral disc displacement, lumbar region: Secondary | ICD-10-CM | POA: Diagnosis not present

## 2021-09-04 DIAGNOSIS — H9313 Tinnitus, bilateral: Secondary | ICD-10-CM | POA: Diagnosis not present

## 2021-09-04 DIAGNOSIS — H903 Sensorineural hearing loss, bilateral: Secondary | ICD-10-CM | POA: Diagnosis not present

## 2021-09-07 DIAGNOSIS — S99921A Unspecified injury of right foot, initial encounter: Secondary | ICD-10-CM | POA: Diagnosis not present

## 2021-09-15 ENCOUNTER — Telehealth: Payer: Self-pay | Admitting: Internal Medicine

## 2021-09-18 NOTE — Telephone Encounter (Signed)
I spoke with the pt  ?He is wondering if MR ever sent his path results from bronch to the [facility]  ?He states that MR had mentioned this before, but he never heard back about it  ?Does he need referral? ?

## 2021-09-18 NOTE — Telephone Encounter (Signed)
[name] been a year plus (14 months). I reviewed email. The pathologist Dr Terrance Mass told me me in feb 2022 that he sent it out the [facility] but there is no update in the results section. I have emailed Dr Casey Burkitt again.  ? ?Meanwhile, [name] Is noticed to have PFTs in June 2023.  He also needs 30 min OV at that time with me ? ?Thanks ? ?. ? ?SIGNATURE  ? ? ?Dr. Brand Males, M.D., F.C.C.P,  ?Pulmonary and Critical Care Medicine ?Staff Physician, [place] ?Center Director - Interstitial Lung Disease  Program  ?Medical Director - [place] ICU ?Pulmonary [place] [facility] ?[place], [place], [address] ? ?NPI Number:  NPI #[id number] ?DEA Number: [id number] ? ?Pager: [phone], If no answer  -> Check AMION or Try [phone] ?Telephone (clinical office): [phone] ?Telephone (research): [phone] ? ?2:06 PM ?09/18/2021 ? ? ? ? ? ? ?  Latest Ref Rng & Units 02/09/2021  ?  1:43 PM 07/06/2020  ?  8:47 AM 12/30/2019  ? 10:15 AM 07/15/2019  ?  8:59 AM 01/10/2018  ? 11:01 AM 10/26/2016  ?  9:28 AM 05/03/2016  ?  4:34 PM  ?PFT Results  ?FVC-Pre L 3.61   3.50   3.56   3.58   3.52   4.21   3.55    ?FVC-Predicted Pre % 90   86   88   88   82   96   80    ?FVC-Post L  3.46    3.60   3.53   4.08   3.65    ?FVC-Predicted Post %  85    88   83   93   83    ?Pre FEV1/FVC % % 83   81   82   76   81   78   81    ?Post FEV1/FCV % %  84    79   82   81   83    ?FEV1-Pre L 2.98   2.82   2.93   2.72   2.85   3.30   2.88    ?FEV1-Predicted Pre % 103   96   99   91   91   102   88    ?FEV1-Post L  2.89    2.85   2.88   3.30   3.02    ?DLCO uncorrected ml/min/mmHg 20.68   19.11   20.72   19.51   21.15   26.71   23.92    ?DLCO UNC% % 86   79   86   80   68   84   75    ?DLCO corrected ml/min/mmHg 22.11   19.11   20.55   19.51   20.44   25.62   23.99    ?DLCO COR %Predicted % 92   79   85   80   65   80   75    ?DLVA Predicted % 100   90   92   90   81   87   91    ?TLC  L  5.29    5.86   7.25   6.79   5.85    ?TLC % Predicted %  79    88   106   98   84    ?RV % Predicted %  71    94   155   102   94    ? ? ? ?

## 2021-09-18 NOTE — Telephone Encounter (Signed)
Pt aware of response per MR  ?I have scheduled him with MR for 10/31/21 at 9 am ?

## 2021-10-20 ENCOUNTER — Ambulatory Visit: Payer: PPO

## 2021-10-20 DIAGNOSIS — J849 Interstitial pulmonary disease, unspecified: Secondary | ICD-10-CM

## 2021-10-20 LAB — PULMONARY FUNCTION TEST
DL/VA % pred: 82 %
DL/VA: 3.3 ml/min/mmHg/L
DLCO cor % pred: 61 %
DLCO cor: 14.53 ml/min/mmHg
DLCO unc % pred: 61 %
DLCO unc: 14.53 ml/min/mmHg
FEF 25-75 Pre: 3.38 L/sec
FEF2575-%Pred-Pre: 161 %
FEV1-%Pred-Pre: 80 %
FEV1-Pre: 2.31 L
FEV1FVC-%Pred-Pre: 120 %
FEV6-%Pred-Pre: 71 %
FEV6-Pre: 2.64 L
FEV6FVC-%Pred-Pre: 106 %
FVC-%Pred-Pre: 66 %
FVC-Pre: 2.64 L
Pre FEV1/FVC ratio: 88 %
Pre FEV6/FVC Ratio: 100 %

## 2021-10-23 ENCOUNTER — Telehealth: Payer: Self-pay | Admitting: Internal Medicine

## 2021-10-23 NOTE — Telephone Encounter (Signed)
  Let patinet know  A) that the slide was actually sent to [facility] - our path lab never informed us about the fact they read it to [place] tillI foillowe dup.  B)  per [facility] - it is not IPF but they along with [facility] -place it as unclassfiable ILD. So wait an watch approach is the current correct recommendation  C) recent PFT June 2022 compared to Sept 2022 after years of stability now suggests progression. Is he more dyspneic?   D) need to discuss anti fibrotics esbriet/pirfenidone at followup October 31, 2021 - given the progression     Latest Ref Rng & Units 10/20/2021    4:09 PM 02/09/2021    1:43 PM 07/06/2020    8:47 AM 12/30/2019   10:15 AM 07/15/2019    8:59 AM 01/10/2018   11:01 AM 10/26/2016    9:28 AM  PFT Results  FVC-Pre L 2.64  P 3.61  3.50  3.56  3.58  3.52  4.21   FVC-Predicted Pre % 66  P 90  86  88  88  82  96   FVC-Post L   3.46   3.60  3.53  4.08   FVC-Predicted Post %   85   88  83  93   Pre FEV1/FVC % % 88  P 83  81  82  76  81  78   Post FEV1/FCV % %   84   79  82  81   FEV1-Pre L 2.31  P 2.98  2.82  2.93  2.72  2.85  3.30   FEV1-Predicted Pre % 80  P 103  96  99  91  91  102   FEV1-Post L   2.89   2.85  2.88  3.30   DLCO uncorrected ml/min/mmHg 14.53  P 20.68  19.11  20.72  19.51  21.15  26.71   DLCO UNC% % 61  P 86  79  86  80  68  84   DLCO corrected ml/min/mmHg 14.53  P 22.11  19.11  20.55  19.51  20.44  25.62   DLCO COR %Predicted % 61  P 92  79  85  80  65  80   DLVA Predicted % 82  P 100  90  92  90  81  87   TLC L   5.29   5.86  7.25  6.79   TLC % Predicted %   79   88  106  98   RV % Predicted %   71   94  155  102     P Preliminary result

## 2021-10-25 NOTE — Telephone Encounter (Signed)
Called and spoke with pt letting him know the info per MR and he verbalized understanding. Pt stated that he is more dyspneic as he says when walking from the mailbox back to the house, he is SOB. Stated to pt to keep upcoming OV for further discussion with MR and he verbalized understanding. Nothing further needed.

## 2021-10-31 ENCOUNTER — Encounter: Payer: Self-pay | Admitting: Internal Medicine

## 2021-10-31 ENCOUNTER — Telehealth: Payer: Self-pay | Admitting: Internal Medicine

## 2021-10-31 ENCOUNTER — Ambulatory Visit: Payer: PPO | Admitting: Internal Medicine

## 2021-10-31 VITALS — BP 128/74 | HR 91 | Temp 98.0°F | Ht 68.0 in | Wt 183.4 lb

## 2021-10-31 DIAGNOSIS — J849 Interstitial pulmonary disease, unspecified: Secondary | ICD-10-CM | POA: Diagnosis not present

## 2021-10-31 DIAGNOSIS — R0602 Shortness of breath: Secondary | ICD-10-CM

## 2021-10-31 LAB — CBC WITH DIFFERENTIAL/PLATELET
Basophils Absolute: 0.1 10*3/uL (ref 0.0–0.1)
Basophils Relative: 0.7 % (ref 0.0–3.0)
Eosinophils Absolute: 0.1 10*3/uL (ref 0.0–0.7)
Eosinophils Relative: 0.9 % (ref 0.0–5.0)
HCT: 48 % (ref 39.0–52.0)
Hemoglobin: 16.6 g/dL (ref 13.0–17.0)
Lymphocytes Relative: 34.3 % (ref 12.0–46.0)
Lymphs Abs: 2.9 10*3/uL (ref 0.7–4.0)
MCHC: 34.6 g/dL (ref 30.0–36.0)
MCV: 103.8 fl — ABNORMAL HIGH (ref 78.0–100.0)
Monocytes Absolute: 0.7 10*3/uL (ref 0.1–1.0)
Monocytes Relative: 8.4 % (ref 3.0–12.0)
Neutro Abs: 4.7 10*3/uL (ref 1.4–7.7)
Neutrophils Relative %: 55.7 % (ref 43.0–77.0)
Platelets: 200 10*3/uL (ref 150.0–400.0)
RBC: 4.62 Mil/uL (ref 4.22–5.81)
RDW: 14.2 % (ref 11.5–15.5)
WBC: 8.4 10*3/uL (ref 4.0–10.5)

## 2021-10-31 LAB — BASIC METABOLIC PANEL
BUN: 10 mg/dL (ref 6–23)
CO2: 27 mEq/L (ref 19–32)
Calcium: 8.8 mg/dL (ref 8.4–10.5)
Chloride: 104 mEq/L (ref 96–112)
Creatinine, Ser: 0.93 mg/dL (ref 0.40–1.50)
GFR: 81.07 mL/min (ref >60.00)
Glucose, Bld: 99 mg/dL (ref 70–99)
Potassium: 4.2 mEq/L (ref 3.5–5.1)
Sodium: 139 mEq/L (ref 135–145)

## 2021-10-31 LAB — HEPATIC FUNCTION PANEL
ALT: 17 U/L (ref 0–53)
AST: 16 U/L (ref 0–37)
Albumin: 3.9 g/dL (ref 3.5–5.2)
Alkaline Phosphatase: 66 U/L (ref 39–117)
Bilirubin, Direct: 0.2 mg/dL (ref 0.0–0.3)
Total Bilirubin: 1.4 mg/dL — ABNORMAL HIGH (ref 0.2–1.2)
Total Protein: 6.5 g/dL (ref 6.0–8.3)

## 2021-10-31 LAB — BRAIN NATRIURETIC PEPTIDE: Pro B Natriuretic peptide (BNP): 40 pg/mL (ref 0.0–100.0)

## 2021-10-31 NOTE — Patient Instructions (Addendum)
  ILD (interstitial lung disease) ([facility]) Dyspnea on exertion Medication monitoring  -Clinically interstitial lung disease has progressived  - It is NON-IPF and belongs to unclassifable variety  Plan - check cbc, bmet, lft, bnp 10/31/2021 - get ECHO - get HRCT supine and prine - start esbriet per protocol = meet PulmonIx team to get consent for regstiry . Vaccine counseling Flu vaccine need  - flu and RSV vaccine in fall   Follow-up -= APP in 6 weeks but after echo and HRCT - -Do spirometry and DLCO in 3 months -Return to see Dr. Chase Caller in 3 months or sooner if needed - 30 min visit

## 2021-10-31 NOTE — Progress Notes (Signed)
Synopsis: Referred in November 2017 for evaluation of shortness of breath.  Found to have diffuse parenchymal lung disease of undetermined cause. An open lung biopsy performed on 02/19/2018 showed an early fibrotic process suggestive of UIP.  He only smoked cigarettes for about 3 to 6 months when he was a teenager.  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    OCt 2019   [name] returns to clinic today to discuss the results from his recent open lung biopsy which showed early fibrotic changes suggestive of UIP as well as emphysema.  He tells me today that he is slowly feeling better but he does still have some fatigue and shortness of breath.  He has not been exercising much lately.  He tried to go fishing yesterday and he says that any walking was quite difficult for him.  He says that he is not coughing too much right now.  He is taking prednisone still as directed by her clinic earlier this month and he is wondering what to do with that.  He is still taking a bronchodilator.  He tells me today that he only smokes cigarettes for about 3 to 6 months when he was a teenager.  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   OV March 2020 [name] has returned to clinic today to see me.  He recently was hospitalized for diverticulitis, perforated, requiring emergent surgery. He says that his wound is slowly healing. He resumed CellCept and prednisone per my recommendation. His wife says that his wound is healing fairly well.  His shortness of breath is at baseline.  No recent pneumonia or bronchitis.  He is taking his nintedanib.   [name] GENDER: male DOB: 06/29/1945, MRN: [id number]  Chief Complaint  Patient presents with   Follow-up    Patient has quit taking the [facility] and has felt a lot  better since then. Patient was taking Stiolto but got thrush and took some Nystatin and it resolved it. Patient needs back surgery in about 3-4 months.      OV 04/16/2019: [name] is a 76 year old gentleman who presents for follow-up of NSIP.  He has had worse symptoms since interrupting his CellCept and nintedanib for his colectomy reversal.  He had bleeding when he initially restarted these medicines a few weeks postop, which required stopping them again.  He restarted his nintedanib last week and CellCept with Bactrim about 2 weeks prior.  He has not had any additional bleeding.  No abdominal pain.  He feels better than he did before surgery.  He still has mild dyspnea, not worse than his last visit.  He notices it most when he is leaning over.  No other complaints.  His PCP did his 33-monthlab work earlier this week, and he will call the office to request they send uKorearesults.   OV 03/19/2019: [name] a 76year old gentleman with history of ILD to NSIP and polymyalgia rheumatica on chronic immunosuppression who presents for follow-up.  He underwent colostomy reversal on 01/26/2019, for which his CellCept plus Bactrim and nintedanib were held due to concern for potential surgical complications.  Several weeks following surgery his medications were restarted, and within a day or 2 he developed rectal bleeding.  This continued for several days until  he notified us, and we instructed him to stop.  Within the next day or 2 the bleeding has stopped.  He notices that his breathing has started to get a little bit worse over the last few weeks, and he would like to restart his medications.  He feels like he cannot exert himself due to his mild dyspnea.  He denies abdominal pain, fevers, nausea, vomiting, diarrhea, constipation, or periodic rectal bleeding.  He is following up with Dr. Marlou Starks surgery tomorrow.  He had had a colonoscopy just prior to surgery, but is unsure if he had hemorrhoids.  He  follows with [name] in rheumatology.  His prednisone has been tapered down to 7 mg, which will be his dose for now.  There has been mention of possibly starting Plaquenil in the future for his polymyalgia rheumatica.  OV 01/21/19: [name] is a 76 year old gentleman with a history of fibrosing NSIP diagnosed on open lung biopsy in 2019 who presents for routine follow-up and preoperative evaluation before colostomy reversal next week.  He has a history of polymyalgia rheumatica for which she has been on chronic steroids.  He is followed by [name] from rheumatology.  Currently he is being titrated off his chronic prednisone, currently on 8 mg daily.  For his NSIP he is maintained on CellCept, nintedanib, and Bactrim 3 days a week.  He is doing well, better than last year from breathlessness standpoint.  He went hiking with his wife a few weeks ago, which he reports he would have been unable to do a year ago.  He has nausea and diarrhea associated with nintedanib, but he is able to tolerate these with the use of Zofran and Imodium.  No jaundice, itching, right upper quadrant pain.  In February 2020 he underwent emergency partial colectomy with diverting colostomy for perforated diverticulitis.  He is planning to undergo colostomy reversal next week.  In July he fell, causing an L1 compression fracture.  [name] is planning to do a bone mineral density study, and currently he is not on calcium and vitamin D supplements.  In a few months he is planning on having a small outpatient back surgery to address a bone spur that is causing radicular pain in his left leg.  Today he wants to discuss plans for his pulmonary meds perioperatively.  He has previously had symptomatic renal insufficiency when he tried to come off prednisone on his own too quickly.  During his VATS and colectomy he received 10 mg of dexamethasone in the OR, and he received low-dose Solu-Medrol postoperatively from his  colectomy.     OV 07/21/19: [name] is a 76 year old gentleman with a history of ILD- UIP vs NSIP on chronic Ofev, mycophenolate who presents for follow-up.  He required disruption of these medications for several months in late 2020 his colostomy reversal.  When the meds were restarted postoperatively he had persistent bleeding, leading to prolonged time off medications and restarting them sequentially.  He feels that his breathing has gotten worse over time since then, but is unsure if that was all due to being off the meds or if it is continued to decline since then.  His breathing is usually worse during warmer weather.  He has noticed that his activities are more limited than they used to be.  He recently had an outpatient back surgery where he had to have bone fragments removed that were pinching and nerves.  He was developing left lower extremity weakness preoperatively.  Due to ongoing nerve impingement that is presumed to be due to inflammation, he is currently on a prednisone taper.  Currently on 20 mg daily and will decrease to 15 mg daily tomorrow.  He is on chronic prednisone for polymyalgia rheumatica at 7 mg daily.  He has been on Ofev 100 mg twice daily.  He was initially started on 150 mg twice daily, but required decrease in his dose due to nausea, vomiting, diarrhea.  Over time the symptoms have improved.  He restarted his meds postoperatively in the fall 2020 he had symptoms short-term, but noticed improvement over time.  When he had GI symptoms at the higher dose, he had some improvement with Zofran and Phenergan.  He is currently not having blood in his stools or other issues related to surgery.    He and his wife are planning a cross-country trip with their RV to visit children and grandchildren later this spring where they will be gone for 2 months.  Reviewed rheumatology, Dr. Melissa Noon clinic note 06/15/2019.  Considering starting Plaquenil to reduce dose of prednisone.   OV  4?!5/21  [name] is a 76 year old gentleman with a history of IPF who presents for follow-up.  He is accompanied by his wife today.  He recently had a severe shortness of breath episode that was precipitated by being around a significant amount of sawdust.  He did well with Stiolto, but developed thrush, which has been an issue for him with inhalers in the past.  He is improved with nystatin.  He stopped his Ofev due to severe intractable nausea and vomiting.  He now feels much better and is back to normal.  His shortness of breath is persistent-he has trouble walking around the house.  He has continued on his Bactrim and CellCept plus prednisone for polymyalgia rheumatica.  He wants to know if he can stop his Eliquis to be able to take an NSAID for help with his joint pain.  He has new leg edema bilaterally, but no edema elsewhere.  No chest pain.       OV 10/22/2019  Subjective:  [name], male , DOB: [birth year] , age 41 y.o. , MRN: [id number] , ADDRESS: [place] [address]   PCP [name], MD Rheum - Dr Jannette Fogo 0Dr McQuaid -> Dr Ernest Mallick -> Dr Chase Caller  10/22/2019 -   Chief Complaint  Patient presents with   Follow-up    Pt being referred to Dr. Chase Caller by Dr. Carlis Abbott for ILD.  Pt states he has been doing good since last visit and states that he feels SOB is stable. Denies any complaints of cough or chest discomfort.  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  - in 2019 : considered progressive pre-bx (on PFT/CT per notes)  - in 2019 - clinical CTD ruled out by Dr. Esmeralda Arthur record review_  -High-resolution CT March 2021: Indeterminate for UIP without change since 2017  RX  -  Mid-endJan 2020 -s tarted cellcept/bactrim + ofev (course complicated early February 2020 by diverticulitis and intermittent side effects with stop start of the nintedanib)  Pathological tissue mild emphysema present on surgical lung biopsy October 2019 [not evident on pulmonary function testing]  - Alpha 1 - MM:  Oct 2019  -On inhaler therapy  -No obstruction seen on PFT or emphysema on CT   History of perforated sigmoid diverticulitis early February 2021 few weeks after starting nintedanib/CellCept  -Status post reversal of colostomy in September 2020   HPI [name] 76 y.o. -presents to the ILD center with the above history.  Is a transfer of care to the ILD center.  Recently his case was discussed in June 2021 with interstitial lung disease multidisciplinary conference.  His pathology though showed fibroblastic foci did have then reformed branching fibrosis.  There was ossification.  There is no honeycombing.  There was heterogeneity and fibroblastic foci suggestive of UIP.  Overall it was felt that he had unclassifiable ILD.  The radiologist felt his CT scan had not progressed between 2018 and 2021.  His pulmonary function test were variable.  Therefore it was recommended that he be seen at the ILD center.  His ILD diagnosis as above.  He tells me though that when he started the nintedanib he started feeling better.  Although at the same time or before that he was also committed to inhaler therapy based on pathological emphysema on his lung biopsy from 2019.  This is also helped.  Therefore he is a concomitant diagnosis COPD/emphysema.  He is currently here to discuss his future care options.  He tells me  that he has continued CellCept and Bactrim for his ILD since early 2020 except for interruptions due to surgery.  He has been off nintedanib but recently has rechallenged himself at 150 mg once daily for the last 1 month and he is tolerating it fine.  His rheumatologist is working down on his prednisone to 5 mg/day [this is for polymyalgia rheumatica].  He tells me he has upcoming spinal surgery to the C-spine and later to the L-spine.  He wants clearance for this.  Noted he is on anticoagulation with Eliquis and has had bleeding episode.  [place] [facility] Comprehensive ILD Questionnaire  Symptoms:    Overall he is reporting progressive dyspnea although the conference it was felt radiology did not change over time.  Is been going on insidious onset for the last 3 years.  Dyspnea present on exertion relieved by rest.    SYMPTOM SCALE - ILD 10/22/2019   O2 use ra  Shortness of Breath 0 -> 5 scale with 5 being worst (score 6 If unable to do)  At rest 1  Simple tasks - showers, clothes change, eating, shaving 3  Household (dishes, doing bed, laundry) 3  Shopping 3  Walking level at own pace 4  Walking up Stairs 5  Total (30-36) Dyspnea Score 19  How bad is your cough? 0  How bad is your fatigue yes  How bad is nausea yes  How bad is vomiting?  n  How bad is diarrhea? no  How bad is anxiety? x  How bad is depression x        Past Medical History : As above   ROS: Positive for fatigue and diffuse arthralgia.  No dysphagia.  No dry eyes.  No Raynaud's no weight loss.  No vomiting.  No rash   FAMILY HISTORY of LUNG DISEASE:  -Father had  asthma.  No COPD no sarcoid no cystic fibrosis no hypersensitive pneumonitis no autoimmune disease.  EXPOSURE HISTORY: Briefly smoked cigarettes for 3 years.  Otherwise never smoked  Cigarettes.  He did smoke marijuana very little in 1972 and never use cocaine.  No intravenous drug use     HOME and HOBBY DETAILS [place].  Age of the home is 56 years.  He has lived in the home for 30 years.  No dampness no mildew no mold no humidifier use no CPAP use no nebulizer use.  No steam iron use.  No Jacuzzi use.  There is a misting Fountain in the house because his wife likes it.  But there is no mold or mildew.  No pet gerbils.  No feather pillows.  No mold in the [facility] duct.  No music habits no guarding habits.  No flood of water damage.  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  In the past he is done woodwork, Brewing technologist work, Biomedical engineer.  He has been exposed to asbestos woodwork and furniture work.  During furniture work he got exposed to meth saline chloride.  He also is a [place] (27 items): CellCept, prednisone and nintedanib       Simple office walk 185 feet x  3 laps goal with forehead probe 10/22/2019   O2 used ra  Number laps completed 3  Comments about pace avg  Resting Pulse Ox/HR 98% and 90/min  Final Pulse Ox/HR 96% and 107/min  Desaturated </= 88% no  Desaturated <= 3% points no  Got Tachycardic >/= 90/min yes  Symptoms at end of test Mild dyspnea  Miscellaneous comments x       Results for [name], Randall "SI" (MRN [id number]) as of 10/22/2019 16:07  Ref. Range 05/03/2016 16:34 10/26/2016 09:28 01/10/2018 11:01 07/15/2019 08:59  FVC-Pre Latest Units: L 3.55 4.21 3.52 3.58  FVC-%Pred-Pre Latest Units: % 80 96 82 88   Results for [name], Randall SILER "SI" (MRN [id number]) as of 10/22/2019 16:07  Ref. Range 05/03/2016 16:34 10/26/2016 09:28 01/10/2018 11:01 07/15/2019 08:59  TLC Latest Units: L 5.85 6.79 7.25 5.86  TLC % pred Latest Units: % 84 98 106 88   Results for [name], Randall SILER "SI" (MRN [id number]) as of 10/22/2019 16:07  Ref. Range 05/03/2016 16:34 10/26/2016 09:28 01/10/2018 11:01  07/15/2019 08:59  DLCO unc Latest Units: ml/min/mmHg 23.92 26.71 21.15 19.51  DLCO unc % pred Latest Units: % 75 84 68 80      ROS - per HPI  Results for [name], Randall SILER "SI" (MRN [id number]) as of 10/22/2019 16:07  Ref. Range 05/29/2016 10:05 10/10/2016 13:54 01/14/2018 09:50 01/14/2018 09:50 03/12/2018 11:24 6/10  Aspergillus flavus Unknown REPORT       Aspergillus Fumigatus Latest Ref Range: NEGATIVE     NEGATIVE    A fumigatus #1 Unknown REPORT       Aspergillus [place] Unknown REPORT       [place] Serum Latest Ref Range: NEGATIVE  REPORT   NEGATIVE    Apullulans Unknown REPORT       A-1 Antitrypsin, Ser Latest Ref Range: 83 - 199 mg/dL     125   Anti Nuclear Antibody (ANA) Latest Ref Range: NEGATIVE  NEG   NEGATIVE  CENTROMERE AB SCREEN Latest Ref Range: <1.0 NEG AI <1.0 NEG  <6.7 NEG     Cyclic Citrullin Peptide Ab Latest Units: UNITS <16   <16  < 16  RA Latex Turbid. Latest Ref Range: <14 IU/mL 24 (H)   38 (H)  32  B burgdorferi Ab IgG+IgM Latest Ref Range: <0.90 Index  <0.90      SSA (Ro) (ENA) Antibody, IgG Latest Ref Range: <1.0 NEG AI <1.0 NEG   <1.0 NEG    SSB (La) (ENA) Antibody, IgG Latest Ref Range: <1.0 NEG AI <1.0 NEG   <1.0 NEG    Scleroderma (Scl-70) (ENA) Antibody, IgG Latest Ref Range: <1.0 NEG AI <1.0 NEG  <1.0 NEG <1.0 NEG      IMPRESSION: COMPARISON:  CT chest, 12/06/2017, 06/13/2016, 04/02/2016   FINDINGS: Cardiovascular: Aortic atherosclerosis. Normal heart size. No pericardial effusion.   Mediastinum/Nodes: No enlarged mediastinal, hilar, or axillary lymph nodes. Thyroid gland, trachea, and esophagus demonstrate no significant findings.   Lungs/Pleura: Wedge resections of the right lung. Redemonstrated mild pulmonary fibrosis with a slight apical to basal gradient featuring tubular bronchiectasis in the lower lobes, peripheral irregular interstitial pulmonary opacity, and no evidence of bronchiolectasis or honeycombing. Fibrotic findings are  not significantly changed compared to prior examinations. No significant air trapping on expiratory phase examination. No pleural effusion or pneumothorax.  1. No significant change in mild pulmonary fibrosis with a slight apical to basal gradient featuring tubular bronchiectasis in the lower lobes, peripheral irregular interstitial pulmonary opacity, and no evidence of bronchiolectasis or honeycombing. No significant air trapping on expiratory phase examination. Findings remain consistent with "indeterminate for UIP" pattern fibrosis by ATS pulmonary fibrosis criteria. Findings are indeterminate for UIP per consensus guidelines: Diagnosis of Idiopathic Pulmonary Fibrosis: An Official ATS/ERS/JRS/ALAT Clinical Practice Guideline. [place]cher, Iss 5, [phone], Jan 12 2017.   2.  Interval wedge biopsies of the right lung.   3.  Aortic Atherosclerosis (ICD10-I70.0).     Electronically Signed   By: Eddie Candle M.D.   On: 07/30/2019 16:33     OV 12/30/2019  Subjective:  [name], male , DOB: November 12, 1945 , age 42 y.o. , MRN: [id number] , ADDRESS: [place] [address]   12/30/2019 -   Chief Complaint  Patient presents with   Follow-up    reports feeling "about the same" since last visit. Last albuterol use was this past weekend   Follow-up unclassifiable interstitial lung disease with trace positive rheumatoid factor  HPI Lowen Barringer Vanderhoef 76 y.o. -presents for follow-up. Since his last visit we recheck his autoimmune panel. His rheumatoid factor is only trace positive. He had pulmonary function test today and is documented below the stable. His spring 2021 CT chest is stable. Overall stability for few years. He again tells me that he is much better than he was 18 months ago in terms of shortness of breath. Symptom scores document that. At this point in time he is off CellCept. He thinks his improvement is related to  starting nintedanib. His wife is here with him and I am meeting her for the first time. She did acknowledge that this potential that he is feeling better because back then when he had the PVCs of ILD diagnosed he was not in a good frame of mind. Because he was dealing with back and neck issues. This is because his pre and post nintedanib PFT shows stability. Since his last  visit he is off CellCept. He has had a C-spine surgery. His back surgery has been postponed. However he is dealing with significant diarrhea. The diarrhea is despite stopping CellCept. This is because of nintedanib. He currently has dropped his nintedanib to 100 mg once daily but despite that is still having diarrhea. He does take some coffee in the morning with sugar but despite that has diarrhea. He prefers that he stop the nintedanib but in the past he acknowledges that it helped him.      OV 07/06/2020  Subjective:  [name], male , DOB: 1945-08-14 , age 74 y.o. , MRN: [id number] , ADDRESS: [place] [address] PCP [name], MD Patient Care Team: [name], MD as PCP - General (Internal Medicine) Troy Sine, MD as PCP - Cardiology (Cardiology)  This Provider for this visit: Treatment Team:  Attending Provider: Brand Males, MD    07/06/2020 -   Chief Complaint  Patient presents with   Follow-up    PFT performed today.  Pt states he feels like he might have declined some since last visit. Pt states his breathing is some worse. Denies any complaints of cough.      HPI Copeland Neisen Bells 76 y.o. -returns for follow-up for his ILD.  He presents with his wife.  He is on supportive care.  He quit taking his CellCept and nintedanib in the summer 2021 following diarrhea in the setting of GI surgery and colostomy reversal.  He does not want to take these drugs anymore.  However he tells me that he is feeling more short of breath in the last 4 months.  Present on  exertion relieved by rest.  His symptom score reflect worsening shortness of breath.  He continues to have some amount of diarrhea because of reversal of his colostomy.  He had a high-resolution CT chest that shows stability in ILD per the radiologist since 2017.  His walking desaturation test is stable.  However his pulmonary function test shows confusing signal compared to 6 months or 1 year ago.  Is stable versus normal variation versus slight decline.  I personally visualized the images of the pulmonary function test and the grph with him.  He is open to getting his pulmonary pathology slides sent for third opinion at an outside institution.  We discussed antifibrotic's in case his fibrosis is getting worse.  He is definitely not interested in the other option of pirfenidone after hearing side effects of nausea, vomiting, weight loss, fatigue and possible diarrhea.  He is interested in drugs that may not have GI side effects.  These are available in clinical trials.  For which she will need an IPF diagnosis.  Therefore is interested in getting another opinion on the slides to see if you would qualify for trials in the future.  His wife does indicate that he did have an episode of chest pain many went outdoors a few months ago and was climbing a hill.  In fact the chest pain was so bad it was central that he had to come back.  They have not reported this to the cardiologist.  He has cardiology follow-up coming up with Dr. Claiborne Billings in May 2022.  He had normal stress test or low risk in 2017.  His echo was normal in May 2021.       IMPRESSION: HRCT  Feb 2022  COMPARISON:  07/30/2019, 04/02/2016   FINDINGS: Cardiovascular: Aortic atherosclerosis. Normal  heart size. No pericardial effusion.   Mediastinum/Nodes: No enlarged mediastinal, hilar, or axillary lymph nodes. Thyroid gland, trachea, and esophagus demonstrate no significant findings.   Lungs/Pleura: Evidence of prior right lung wedge  resections. No significant interval change in pattern of mild pulmonary fibrosis featuring irregular peripheral interstitial opacity and mild, tubular bronchiectasis at the lung basis without evidence of significant subpleural bronchiolectasis or honeycombing. No significant air trapping on expiratory phase imaging. No pleural effusion or pneumothorax.    1. No significant interval change in pattern of mild pulmonary fibrosis featuring irregular peripheral interstitial opacity and mild, tubular bronchiectasis at the lung basis without evidence of significant subpleural bronchiolectasis or honeycombing. Findings remain consistent with an "indeterminate for UIP" pattern of fibrosis by pulmonary fibrosis criteria. Findings are indeterminate for UIP per consensus guidelines: Diagnosis of Idiopathic Pulmonary Fibrosis: An Official ATS/ERS/JRS/ALAT Clinical Practice Guideline. [place], Iss 5, [phone], Jan 12 2017. 2. Evidence of prior right lung wedge resections.   Aortic Atherosclerosis (ICD10-I70.0).    According to the radiologist the pulmonary fibrosis is stable between 2017 through February 2022 on CT scan of the chest   Plan -We discussed about starting the other antifibrotic pirfenidone but given your history of GI side effects with nintedanib I respect your desire to refuse but continue with expectant follow-up  -We will send a message to Dr. Shelva Majestic to evaluate you for angina  -Once Dr. Claiborne Billings clears you I think you should join pulmonary rehabilitation  -I have sent a note to our pathologist to get another opinion from another institution on your lung pathology to see if you have undifferentiated connective tissue disease or UIP/IPF.  If you have UIP/IPF you might qualify for clinical trials with drugs that do not have GI side effects  Follow-up -Do spirometry and DLCO in 3 months =     OV 02/09/2021  Subjective:  [name], male , DOB: 12/02/1945 , age 12 y.o. , MRN: [id number] , ADDRESS: [place] [address] PCP [name], MD Patient Care Team: [name], MD as PCP - General (Internal Medicine) Troy Sine, MD as PCP - Cardiology (Cardiology)  This Provider for this visit: Treatment Team:  Attending Provider: Brand Males, MD    02/09/2021 -   Chief Complaint  Patient presents with   Follow-up    PFT performed today.  Pt states he recently had back surgery 12/22/20. States his breathing has been about the same.   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  - in 2019 : considered progressive pre-bx (on PFT/CT per notes)  - in 2019 - clinical CTD ruled out by Dr. Esmeralda Arthur record review_  -High-resolution CT March 2021: Indeterminate for UIP without change since 2017  RX  - Mid-endJan 2020 -s tarted cellcept/bactrim + ofev (course complicated early February 2020 by diverticulitis and intermittent side effects with stop start of the nintedanib)  - stoppe  cellcept June 2021 due to diarrhea/immune suppressino  - off ofev aug 2021 due to severe diarrhea  Pathological tissue mild emphysema present on surgical lung biopsy October 2019 [not evident on pulmonary function testing]  - Alpha 1 - MM:  Oct 2019  -On inhaler therapy  -No obstruction seen on PFT or emphysema on CT   History of perforated sigmoid diverticulitis early February 2021  few weeks after starting nintedanib/CellCept  -Status post reversal of colostomy in September 2020  Bak fusio surgery summer 2022  HPI Alioune Hodgkin Franek 76 y.o. -follow-up unclassifiable ILD.  Last seen first in February 2022.  After that in July 2022 he saw a nurse practitioner for a preop clearance.  Since then he has had back fusion surgery.  He is slowly recovering from that.  He is here with his wife [name].  Dyspnea score is stable.  He is not interested in antifibrotic's given previous side effects.  Last visit in February 2022 I contacted pathology to get another opinion from the [facility].  This is because the 2019 pathology revealed grade at [facility] said unclassifiable ILD but also indicated that there might be UIP.  I was not sure.  Still waiting on that interpretation in clinic.  I followed up with pathology about it nevertheless he is doing stable.  He will have a high-dose flu shot today.  He has never had COVID and wanted to know if he should get the COVID by Valent mRNA booster.  I replied in the affirmative.  Currently is dealing a lot with back issues and slowly getting more mobile.  He and his wife wanting to travel to [place] to visit family.  They asked about travel advice in the era of COVID.     OV 10/31/2021  Subjective:  [name], male , DOB: July 11, 1945 , age 69 y.o. , MRN: [id number] , ADDRESS: 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   - May clinic 3rd opinion 2022:  it is not IPF but they along with [facility] -place it as unclassfiable ILD.  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  - in 2019 : considered progressive pre-bx (on PFT/CT per notes)  - in 2019 - clinical CTD ruled out by Dr. BEsmeralda Arthurrecord review_  -High-resolution CT March 2021: Indeterminate for UIP without change since 2017  - Last HRCT feb 2022  RX  - Mid-endJan 2020 -s tarted cellcept/bactrim + ofev (course complicated early February 2020 by diverticulitis and intermittent side effects with stop start of the nintedanib)  - stoppe cellcept June 2021 due to diarrhea/immune suppressino  - off ofev aug 2021 due to severe diarrhea  Pathological tissue mild emphysema present on surgical lung biopsy  October 2019 [not evident on pulmonary function testing]  - Alpha 1 - MM:  Oct 2019  -On inhaler therapy  -No obstruction seen on PFT or emphysema on CT   History of perforated sigmoid diverticulitis early February 2021 few weeks after starting nintedanib/CellCept  -Status post reversal of colostomy in September 2020  Bak fusio surgery summer 2022  L   10/31/2021 -   Chief Complaint  Patient presents with   Follow-up    Pt states that his breathing has become worse since last visit. Denies any complaints of cough.     HPI Jacarius Handel Ginther 75 y.o. -returns for follow-up.  Last seen in September 2022.  He states our office never called back to make his 83-monthfollow-up.  He then had to call because he is now having insidious onset of worsening shortness of breath.  In December 2022 after his organ trip he picked up a respiratory viral infection with persistent cough.   Since then he has been more short of breath.  His symptom score is only slightly worse.  His walking desaturation test is stable but his pulmonary function test shows a significant decline in FVC and DLCO.  Last CT scan of the chest was a year ago.  Last echo was a year ago.  He is worried about his decline.  In the past he has been intolerant to nintedanib we [facility] as unclassifiable ILD.  Explained to him IPF versus non-IPF.  Explained to him progressive phenotype and by unclassifiable ILD if it sent.     SYMPTOM SCALE - ILD 10/22/2019  12/30/2019  07/06/2020  02/09/2021 Supporitive care. S/p back surgery. 183# 10/31/2021   O2 use ra ra ra ra a  Shortness of Breath 0 -> 5 scale with 5 being worst (score 6 If unable to do)      At rest 1 1 0 1 1  Simple tasks - showers, clothes change, eating, shaving _0 Household (dishes, doing bed, laundry) _1 Shopping _2 Walking level at own pace _3 Walking up Stairs _4 Total (30-36) Dyspnea Score _5 How bad is your cough? 0 0 0 0 0  How bad is your fatigue yes _6 How bad is nausea yes 3 ofev _7 How bad is vomiting?  n 0 0 0 0  How bad is diarrhea? no 3 ofev 1 - baseline from GI surgery 0 00  How bad is anxiety? x 1 0 1 0  How bad is depression x 1 0 1 0     Simple office walk 185 feet x  3 laps goal with forehead probe 10/22/2019  07/06/2020  10/31/2021   O2 used ra ra ra  Number laps completed _8 Comments about pace avg avg   Resting Pulse Ox/HR 98% and 90/min 98% and HR 78 99% and HR 91  Final Pulse Ox/HR 96% and 107/min 97% and HR 95 96% and HR 108  Desaturated </= 88% no  nni  Desaturated <= 3% points no  no  Got Tachycardic >/= 90/min yes  Yes, 3 points  Symptoms at end of test Mild dyspnea Mild dy spnea Mild dyspnea  Miscellaneous comments x     CT Chest data  No results found.  PFT     Latest Ref Rng & Units 10/20/2021     4:09 PM 02/09/2021    1:43 PM 07/06/2020    8:47 AM 12/30/2019   10:15 AM 07/15/2019    8:59 AM 01/10/2018   11:01 AM 10/26/2016    9:28 AM  PFT Results  FVC-Pre L 2.64  P 3.61  3.50  3.56  3.58  3.52  4.21   FVC-Predicted Pre % 66  P 90  86  88  88  82  96   FVC-Post L   3.46   3.60  3.53  4.08   FVC-Predicted Post %   85   88  83  93   Pre FEV1/FVC % % 88  P 83  81  82  76  81  78   Post FEV1/FCV % %   84   79  82  81   FEV1-Pre L 2.31  P 2.98  2.82  2.93  2.72  2.85  3.30   FEV1-Predicted Pre % 80  P 103  96  99  91  91  102   FEV1-Post L   2.89   2.85  2.88  3.30   DLCO uncorrected ml/min/mmHg 14.53  P 20.68  19.11  20.72  19.51  21.15  26.71   DLCO UNC% % 61  P 86  79  86  80  68  84   DLCO corrected ml/min/mmHg 14.53  P 22.11  19.11  20.55  19.51  20.44  25.62   DLCO COR %Predicted % 61  P 92  79  85  80  65  80   DLVA Predicted % 82  P 100  90  92  90  81  87   TLC L   5.29   5.86  7.25  6.79   TLC % Predicted %   79   88  106  98   RV % Predicted %   71   94  155  102     P Preliminary result       has a past medical history of Allergy to alpha-gal, Arthritis, Chest pain, Complication of anesthesia, Difficult intubation (02/19/2018), Digestive problems, Dyspnea, Dysrhythmia, GERD (gastroesophageal reflux disease), H/O viral myocarditis, Head injury, closed, with concussion, Hyperlipidemia, Mild mitral valve prolapse, PAF (paroxysmal atrial fibrillation) (HCC), Polymyalgia rheumatica (HCC), Pre-diabetes, Pulmonary fibrosis ([place]), and Sleep apnea.   reports that he has never smoked. He has never used smokeless tobacco.  Past Surgical History:  Procedure Laterality Date   ANTERIOR CERVICAL DECOMP/DISCECTOMY FUSION N/A 11/10/2019   Procedure: Cervical Three-Four Anterior cervical decompression/discectomy/fusion;  Surgeon: [name], MD;  Location: [facility];  Service: Neurosurgery;  Laterality: N/A;  Cervical Three-Four Anterior cervical decompression/discectomy/fusion    apendectomy  1965   APPENDECTOMY     APPLICATION OF ROBOTIC ASSISTANCE FOR SPINAL PROCEDURE N/A 12/22/2020   Procedure: APPLICATION OF ROBOTIC ASSISTANCE FOR SPINAL PROCEDURE;  Surgeon: [name], MD;  Location: [facility];  Service: Neurosurgery;  Laterality: N/A;   BACK SURGERY     bone spur Bilateral 1999   feet   CARDIAC CATHETERIZATION  2001   CHOLECYSTECTOMY  2004   COLON RESECTION N/A 06/15/2018   Procedure: SIGMOID COLON RESECTION;  Surgeon: [name], MD;  [facility]: [facility];  Service: General;  Laterality: N/A;   COLONOSCOPY     COLOSTOMY N/A 06/15/2018   Procedure: COLOSTOMY;  Surgeon: [name], MD;  [facility];  Service: General;  Laterality: N/A;   COLOSTOMY TAKEDOWN N/A 01/26/2019   Procedure: LAPAROSCOPIC ASSISTED COLOSTOMY TAKEDOWN;  Surgeon: [name], MD;  Location: [facility];  Service: General;  Laterality: N/A;   EYE SURGERY Bilateral    cataract removal   LAPAROSCOPIC LYSIS OF ADHESIONS N/A 01/26/2019   Procedure: LAPAROSCOPIC LYSIS OF ADHESIONS;  Surgeon: [name], MD;  Location: [facility];  Service: General;  Laterality: N/A;   LAPAROTOMY N/A 06/15/2018   Procedure: EXPLORATORY LAPAROTOMY;  Surgeon: [name], MD;  [facility];  Service: General;  Laterality: N/A;   LUMBAR LAMINECTOMY/ DECOMPRESSION WITH MET-RX Right 05/05/2013   Procedure: Right Lumbar three-four Extraforaminal Microdiskectomy with Metrex;  Surgeon: [name], MD;  [facility];  Service: Neurosurgery;  Laterality: Right;  Right Lumbar three-four Extraforaminal Microdiskectomy with Metrex   LUNG BIOPSY Right 02/19/2018   Procedure: LUNG BIOPSY;  Surgeon: [name], MD;  Location: [facility];  Service: Thoracic;  Laterality: Right;   POSTERIOR LUMBAR FUSION 4 LEVEL N/A 12/22/2020   Procedure: Lumbar one-two Posterior lumbar interbody fusion with stabilization from Thoracic ten to Lumbar one with robotic screw placement;  Surgeon: [name], MD;  Location: [facility];  Service: Neurosurgery;  Laterality: N/A;  RM 20   SHOULDER OPEN ROTATOR CUFF REPAIR Bilateral 2001   TONSILLECTOMY     VIDEO ASSISTED THORACOSCOPY Right 02/19/2018   Procedure: VIDEO ASSISTED THORACOSCOPY;  Surgeon: [name], MD;  Location: [facility];  Service: Thoracic;  Laterality: Right;    Allergies  Allergen Reactions   Xylocaine [Lidocaine] Anaphylaxis    Injection form   Codeine Nausea And Vomiting   Statins Other (See Comments)    Muscle soreness and an aching feeling all over Myalgias   Aldactone [Spironolactone]     Sore nipples and chest   Alpha-Gal     2015; has been able to re-introduce red meat for the past 2 1/2 years without reaction (as of 01/21/19)   Percocet [Oxycodone-Acetaminophen] Itching    Immunization History  Administered Date(s) Administered   Fluad Quad(high Dose 65+) 02/09/2021   Influenza Split 03/20/2016   Influenza, High Dose Seasonal PF 01/29/2018, 02/25/2019, 03/20/2019, 12/13/2019   Influenza,inj,Quad PF,6+ Mos 03/12/2017   Moderna Sars-Covid-2 Vaccination 06/04/2019, 07/10/2019, 03/18/2020   Pneumococcal Conjugate-13 01/10/2018    Family History  Problem Relation Age of Onset   Rheum arthritis Mother    Thyroid cancer Mother    Heart disease Father    Asthma Father    Thyroid cancer Sister    Melanoma Brother      Current Outpatient Medications:    acetaminophen (TYLENOL) 650 MG CR tablet, Take 1,300 mg by mouth every 8 (eight) hours as needed for pain., Disp: , Rfl:    budesonide-formoterol (SYMBICORT) 80-4.5 MCG/ACT inhaler, Inhale 2 puffs into the lungs 2 (two) times daily as needed (shortness of breath)., Disp: , Rfl:    cetirizine (ZYRTEC) 10 MG tablet, Take 10 mg by mouth daily as needed for allergies. , Disp: , Rfl:    Cholecalciferol (DIALYVITE VITAMIN D 5000) 125 MCG (5000 UT) capsule, Take 5,000 Units by mouth daily., Disp: , Rfl:    diltiazem (CARDIZEM CD) 120 MG 24 hr capsule, Take 1 capsule (120 mg total) by  mouth daily., Disp: 90 capsule, Rfl: 3   diphenoxylate-atropine (LOMOTIL) 2.5-0.025 MG tablet, Take 1 tablet by mouth 4 (four) times daily as needed for diarrhea or loose stools., Disp: , Rfl:  ELIQUIS 5 MG TABS tablet, TAKE ONE TABLET (5MG TOTAL) BY MOUTH TWODAILY (Patient taking differently: Take 5 mg by mouth 2 (two) times daily.), Disp: 180 tablet, Rfl: 1   ezetimibe (ZETIA) 10 MG tablet, TAKE ONE (1) TABLET BY MOUTH EVERY DAY, Disp: 90 tablet, Rfl: 3   fluticasone (FLONASE) 50 MCG/ACT nasal spray, Place 2 sprays into both nostrils daily as needed for allergies or rhinitis., Disp: , Rfl:    gabapentin (NEURONTIN) 300 MG capsule, Take 300 mg by mouth 2 (two) times daily., Disp: , Rfl:    hydroxychloroquine (PLAQUENIL) 200 MG tablet, Take 200 mg by mouth 2 (two) times daily., Disp: , Rfl:    metoprolol tartrate (LOPRESSOR) 25 MG tablet, TAKE ONE TABLET BY MOUTH TWICE A DAY (Patient taking differently: Take 25 mg by mouth 2 (two) times daily.), Disp: 180 tablet, Rfl: 3   nystatin (MYCOSTATIN) 100000 UNIT/ML suspension, Take 5 mLs (500,000 Units total) by mouth every other day., Disp: 437 mL, Rfl: 11   ondansetron (ZOFRAN) 4 MG tablet, Take 4 mg by mouth every 8 (eight) hours as needed for nausea or vomiting. , Disp: , Rfl:    predniSONE (DELTASONE) 5 MG tablet, Take 5 mg by mouth daily. , Disp: , Rfl:    rosuvastatin (CRESTOR) 10 MG tablet, TAKE ONE-HALF TABLET (5MG TOTAL) BY MOUTH ONCE A WEEK, Disp: 20 tablet, Rfl: 2   tamsulosin (FLOMAX) 0.4 MG CAPS capsule, Take 0.4 mg by mouth 2 (two) times daily. , Disp: , Rfl:    Trolamine Salicylate (ASPERCREME EX), Apply 1 application topically daily as needed (arthritis pain)., Disp: , Rfl:       Objective:   Vitals:   10/31/21 0903  BP: 128/74  Pulse: 91  Temp: 98 F (36.7 C)  TempSrc: Oral  SpO2: 99%  Weight: 183 lb 6.4 oz (83.2 kg)  Height: _0  (1.727 m)    Estimated body mass index is 27.89 kg/m as calculated from the following:    Height as of this encounter: _1  (1.727 m).   Weight as of this encounter: 183 lb 6.4 oz (83.2 kg).  _2 @  [facility] Weights   10/31/21 0903  Weight: 183 lb 6.4 oz (83.2 kg)     Physical Exam General: No distress. Looks well Neuro: Alert and Oriented x 3. GCS 15. Speech normal Psych: Pleasant Resp:  Barrel Chest - no.  Wheeze - no, Crackles -  some at base, No overt respiratory distress CVS: Normal heart sounds. Murmurs - no Ext: Stigmata of Connective Tissue Disease - no HEENT: Normal upper airway. PEERL +. No post nasal drip BACK -  SCAR +        Assessment:       ICD-10-CM   1. ILD (interstitial lung disease) (HCC)  J84.9 CBC with Differential/Platelet    Basic metabolic panel    Hepatic function panel    B Nat Peptide    ECHOCARDIOGRAM COMPLETE    CT Chest High Resolution    Pulmonary function test    2. Shortness of breath  R06.02 B Nat Peptide         Plan:     Patient Instructions   ILD (interstitial lung disease) (HCC) Dyspnea on exertion Medication monitoring  -Clinically interstitial lung disease has progressived  - It is NON-IPF and belongs to unclassifable variety  Plan - check cbc, bmet, lft, bnp 10/31/2021 - get ECHO - get HRCT supine and prine - start esbriet per protocol = meet PulmonIx  team to get consent for regstiry . Vaccine counseling Flu vaccine need  - flu and RSV vaccine in fall   Follow-up -= APP in 6 weeks but after echo and HRCT - -Do spirometry and DLCO in 3 months -Return to see Dr. Chase Caller in 3 months or sooner if needed - 30 min visit ( Level 05 visit: Estb 40-54 min  in  visit type: on-site physical face to visit  in total care time and counseling or/and coordination of care by this undersigned MD - Dr Brand Males. This includes one or more of the following on this same day 10/31/2021: pre-charting, chart review, note writing, documentation discussion of test results, diagnostic or treatment  recommendations, prognosis, risks and benefits of management options, instructions, education, compliance or risk-factor reduction. It excludes time spent by the [place] or office staff in the care of the patient. Actual time 11 min)   SIGNATURE    Dr. Brand Males, M.D., F.C.C.P,  Pulmonary and Critical Care Medicine Staff Physician, [place] Director - Interstitial Lung Disease  Program  Pulmonary [place] at [place], [place], [address]  Pager: [phone], If no answer or between  15:00h - 7:00h: call [phone] Telephone: [phone]  9:42 AM 10/31/2021

## 2021-10-31 NOTE — Telephone Encounter (Signed)
     For radiology addendum:   - please call radiologist assistant line [phone] and specify Auren Valdes Resendes , 1945-11-03 and [id number] - mention imaging type HRT and date 2022 and 2021 - request addendum for purpose of  -> reports says there is "tubular bronchiectasis" but need to know if there is "traction bronchiectasis" for research purpose - need this clarity   Please send phone message back when done  Thanks    SIGNATURE    Dr. Brand Males, M.D., F.C.C.P,  Pulmonary and Critical Care Medicine Staff Physician, [place] Director - Interstitial Lung Disease  Program  Pulmonary [place] at [place], [place], [address]  Pager: [phone], If no answer  OR between  19:00-7:00h: page [phone] Telephone (clinical office): [phone] Telephone (research): [phone]  9:34 AM 10/31/2021

## 2021-11-01 ENCOUNTER — Telehealth: Payer: Self-pay

## 2021-11-01 ENCOUNTER — Other Ambulatory Visit (HOSPITAL_COMMUNITY): Payer: Self-pay

## 2021-11-01 DIAGNOSIS — J849 Interstitial pulmonary disease, unspecified: Secondary | ICD-10-CM

## 2021-11-01 NOTE — Telephone Encounter (Signed)
[facility] and spoke with Malachy Mood about the addendums that need to be done on pt's HRCTs. Malachy Mood stated she would send this to Radiologist. Routing back to MR.

## 2021-11-01 NOTE — Telephone Encounter (Addendum)
Received New start paperwork for ESBRIET. Will update as we work through the benefits process.  Submitted a Prior Authorization request to  [employer]  for ESBRIET via CoverMyMeds. Will update once we receive a response.   Key: [id number]

## 2021-11-01 NOTE — Telephone Encounter (Signed)
Thanks     SIGNATURE    Dr. Brand Males, M.D., F.C.C.P,  Pulmonary and Critical Care Medicine Staff Physician, [facility] Director - Interstitial Lung Disease  Program  Medical Director - [facility] Pulmonary [place] at [place], [place], [address]  NPI Number:  NPI #[id number] [facility] Number: [id number]  Pager: [phone], If no answer  -[place] or [place] [phone] Telephone (clinical office): [phone] Telephone (research): [phone]  1:36 PM 11/01/2021

## 2021-11-02 ENCOUNTER — Telehealth: Payer: Self-pay | Admitting: Internal Medicine

## 2021-11-08 NOTE — Telephone Encounter (Signed)
At this time we are still awaiting denial correspondence from [place] so we can determine how best to proceed. Will provide update in ongoing BIV encounter once obtained.

## 2021-11-08 NOTE — Telephone Encounter (Signed)
Please advise on this for pt if you have any update.

## 2021-11-09 ENCOUNTER — Other Ambulatory Visit (HOSPITAL_COMMUNITY): Payer: Self-pay

## 2021-11-09 NOTE — Telephone Encounter (Signed)
Called and spoke with pt letting him know info per [place] with pharmacy team and he verbalized understanding. Will await update.

## 2021-11-09 NOTE — Telephone Encounter (Signed)
Received notification from  [facility]  regarding a prior authorization for  Esbriet Capsules . Authorization has been APPROVED from 11/08/21 to 11/09/22.   Per test claim, copay for 30 days supply is $2234.05  Patient can fill through [facility]: [phone]   Authorization # [id number]  Submitted Patient Assistance Application to [facility] for [place] along with provider portion, patient portion, med list, insurance card copy, and PA approval letter. Will update patient when we receive a response.  Fax# [phone] Phone# [phone]  Knox Saliva, PharmD, MPH, BCPS, CPP Clinical Pharmacist (Rheumatology and Pulmonology)

## 2021-11-10 DIAGNOSIS — Z9889 Other specified postprocedural states: Secondary | ICD-10-CM | POA: Diagnosis not present

## 2021-11-10 DIAGNOSIS — I10 Essential (primary) hypertension: Secondary | ICD-10-CM | POA: Diagnosis not present

## 2021-11-10 DIAGNOSIS — I482 Chronic atrial fibrillation, unspecified: Secondary | ICD-10-CM | POA: Diagnosis not present

## 2021-11-10 DIAGNOSIS — E1169 Type 2 diabetes mellitus with other specified complication: Secondary | ICD-10-CM | POA: Diagnosis not present

## 2021-11-10 DIAGNOSIS — E785 Hyperlipidemia, unspecified: Secondary | ICD-10-CM | POA: Diagnosis not present

## 2021-11-10 DIAGNOSIS — E1159 Type 2 diabetes mellitus with other circulatory complications: Secondary | ICD-10-CM | POA: Diagnosis not present

## 2021-11-10 DIAGNOSIS — D6869 Other thrombophilia: Secondary | ICD-10-CM | POA: Diagnosis not present

## 2021-11-10 DIAGNOSIS — Z7951 Long term (current) use of inhaled steroids: Secondary | ICD-10-CM | POA: Diagnosis not present

## 2021-11-10 DIAGNOSIS — J849 Interstitial pulmonary disease, unspecified: Secondary | ICD-10-CM | POA: Diagnosis not present

## 2021-11-10 DIAGNOSIS — Z7901 Long term (current) use of anticoagulants: Secondary | ICD-10-CM | POA: Diagnosis not present

## 2021-11-10 DIAGNOSIS — I480 Paroxysmal atrial fibrillation: Secondary | ICD-10-CM | POA: Diagnosis not present

## 2021-11-10 DIAGNOSIS — D692 Other nonthrombocytopenic purpura: Secondary | ICD-10-CM | POA: Diagnosis not present

## 2021-11-10 DIAGNOSIS — J449 Chronic obstructive pulmonary disease, unspecified: Secondary | ICD-10-CM | POA: Diagnosis not present

## 2021-11-10 DIAGNOSIS — E782 Mixed hyperlipidemia: Secondary | ICD-10-CM | POA: Diagnosis not present

## 2021-11-10 DIAGNOSIS — M48062 Spinal stenosis, lumbar region with neurogenic claudication: Secondary | ICD-10-CM | POA: Diagnosis not present

## 2021-11-17 NOTE — Telephone Encounter (Signed)
Please advise if there is any update on this for pt. 

## 2021-11-20 NOTE — Telephone Encounter (Signed)
Per ongoing BIV encounter, request for patient assistance was submitted to [facility] back on 6/29. Reached out to [place] and was informed that pt was approved for PAP as of this morning. Will reach out to pt to provide update.

## 2021-11-20 NOTE — Telephone Encounter (Signed)
Received a fax from [place] regarding an approval for [name] patient assistance from 11/20/2021 until patient no longer qualifies due to discontinuation of therapy, changes to patient's current financial status or health insurance and/or patient no longer meets the program eligibility requirements. Called pt and provided update and next steps, phone numbers have been provided. Encouraged pt to reach out if she has any additional questions or concerns. Pt verbalized understanding to all. Relevant Documents have been sent to scan center.  [employer] Phone#: [phone] option 5 [employer] Phone#: [phone]

## 2021-11-28 DIAGNOSIS — L570 Actinic keratosis: Secondary | ICD-10-CM | POA: Diagnosis not present

## 2021-11-28 DIAGNOSIS — L720 Epidermal cyst: Secondary | ICD-10-CM | POA: Diagnosis not present

## 2021-11-28 DIAGNOSIS — X32XXXD Exposure to sunlight, subsequent encounter: Secondary | ICD-10-CM | POA: Diagnosis not present

## 2021-11-29 NOTE — Telephone Encounter (Signed)
Spoke with patient regarding Esbriet. He states he received no calls from [place] despite his phone number being written down on application. He does not recall where he put phone number that [place] provided. He was in the car (not driving) - provided him with phone numbers again. Advised him to reach out to [place] to complete onboarding and then the pharmacy to schedule shipment  Will f/u with patient in a week to determine if he's scheduled Esbriet shipment  Knox Saliva, PharmD, MPH, BCPS, CPP Clinical Pharmacist (Rheumatology and Pulmonology)

## 2021-12-06 MED ORDER — PIRFENIDONE 267 MG PO TABS: ORAL_TABLET | ORAL | 0 refills | Status: DC

## 2021-12-06 MED ORDER — PIRFENIDONE 267 MG PO TABS: 801.0000 mg | ORAL_TABLET | Freq: Three times a day (TID) | ORAL | 0 refills | Status: DC

## 2021-12-06 NOTE — Telephone Encounter (Signed)
Subjective:  Patient called today by [facility] pharmacy team for Esbriet new start counseling.   Patient was last seen and referred by Dr. Chase Caller on 10/31/21.  He has non-IPF ILD (unclassifiable) but has progressed. PMH significant for PAF, HTN, OSA, colectomy, diverticulitis, chronic diarrhea, osteoporosis, hyperlipidemia. Ofev reserved as second-line due to history of diverticulitis and baseline chronic diarrhea  Since our conversation a week ago, patient has scheduled Esbriet shipment to his home. He will be receiving medication this Friday 12/08/21  History of elevated LFTs: No History of diarrhea, nausea, vomiting: Yes  Objective: Allergies  Allergen Reactions   Xylocaine [Lidocaine] Anaphylaxis    Injection form   Codeine Nausea And Vomiting   Statins Other (See Comments)    Muscle soreness and an aching feeling all over Myalgias   Aldactone [Spironolactone]     Sore nipples and chest   Alpha-Gal     2015; has been able to re-introduce red meat for the past 2 1/2 years without reaction (as of 01/21/19)   Percocet [Oxycodone-Acetaminophen] Itching    Outpatient Encounter Medications as of 11/01/2021  Medication Sig   acetaminophen (TYLENOL) 650 MG CR tablet Take 1,300 mg by mouth every 8 (eight) hours as needed for pain.   budesonide-formoterol (SYMBICORT) 80-4.5 MCG/ACT inhaler Inhale 2 puffs into the lungs 2 (two) times daily as needed (shortness of breath).   cetirizine (ZYRTEC) 10 MG tablet Take 10 mg by mouth daily as needed for allergies.    Cholecalciferol (DIALYVITE VITAMIN D 5000) 125 MCG (5000 UT) capsule Take 5,000 Units by mouth daily.   diltiazem (CARDIZEM CD) 120 MG 24 hr capsule Take 1 capsule (120 mg total) by mouth daily.   diphenoxylate-atropine (LOMOTIL) 2.5-0.025 MG tablet Take 1 tablet by mouth 4 (four) times daily as needed for diarrhea or loose stools.   ELIQUIS 5 MG TABS tablet TAKE ONE TABLET (5MG TOTAL) BY MOUTH TWODAILY (Patient taking  differently: Take 5 mg by mouth 2 (two) times daily.)   ezetimibe (ZETIA) 10 MG tablet TAKE ONE (1) TABLET BY MOUTH EVERY DAY   fluticasone (FLONASE) 50 MCG/ACT nasal spray Place 2 sprays into both nostrils daily as needed for allergies or rhinitis.   gabapentin (NEURONTIN) 300 MG capsule Take 300 mg by mouth 2 (two) times daily.   hydroxychloroquine (PLAQUENIL) 200 MG tablet Take 200 mg by mouth 2 (two) times daily.   metoprolol tartrate (LOPRESSOR) 25 MG tablet TAKE ONE TABLET BY MOUTH TWICE A DAY (Patient taking differently: Take 25 mg by mouth 2 (two) times daily.)   nystatin (MYCOSTATIN) 100000 UNIT/ML suspension Take 5 mLs (500,000 Units total) by mouth every other day.   ondansetron (ZOFRAN) 4 MG tablet Take 4 mg by mouth every 8 (eight) hours as needed for nausea or vomiting.    predniSONE (DELTASONE) 5 MG tablet Take 5 mg by mouth daily.    rosuvastatin (CRESTOR) 10 MG tablet TAKE ONE-HALF TABLET (5MG TOTAL) BY MOUTH ONCE A WEEK   tamsulosin (FLOMAX) 0.4 MG CAPS capsule Take 0.4 mg by mouth 2 (two) times daily.    Trolamine Salicylate (ASPERCREME EX) Apply 1 application topically daily as needed (arthritis pain).   No facility-administered encounter medications on file as of 11/01/2021.     Immunization History  Administered Date(s) Administered   Fluad Quad(high Dose 65+) 02/09/2021   Influenza Split 03/20/2016   Influenza, High Dose Seasonal PF 01/29/2018, 02/25/2019, 03/20/2019, 12/13/2019   Influenza,inj,Quad PF,6+ Mos 03/12/2017   Moderna Sars-Covid-2 Vaccination 06/04/2019, 07/10/2019,  03/18/2020   Pneumococcal Conjugate-13 01/10/2018      PFT's TLC  Date Value Ref Range Status  07/06/2020 5.29 L Final      CMP     Component Value Date/Time   NA 139 10/31/2021 1004   NA 140 07/29/2020 1039   K 4.2 10/31/2021 1004   CL 104 10/31/2021 1004   CO2 27 10/31/2021 1004   GLUCOSE 99 10/31/2021 1004   BUN 10 10/31/2021 1004   BUN 8 07/29/2020 1039   CREATININE 0.93  10/31/2021 1004   CREATININE 0.95 07/16/2016 0902   CALCIUM 8.8 10/31/2021 1004   PROT 6.5 10/31/2021 1004   ALBUMIN 3.9 10/31/2021 1004   AST 16 10/31/2021 1004   ALT 17 10/31/2021 1004   ALKPHOS 66 10/31/2021 1004   BILITOT 1.4 (H) 10/31/2021 1004   GFRNONAA >60 12/23/2020 0429   GFRAA >60 11/06/2019 1400      CBC    Component Value Date/Time   WBC 8.4 10/31/2021 1004   RBC 4.62 10/31/2021 1004   HGB 16.6 10/31/2021 1004   HCT 48.0 10/31/2021 1004   PLT 200.0 10/31/2021 1004   MCV 103.8 (H) 10/31/2021 1004   MCH 36.2 (H) 12/23/2020 0429   MCHC 34.6 10/31/2021 1004   RDW 14.2 10/31/2021 1004   LYMPHSABS 2.9 10/31/2021 1004   MONOABS 0.7 10/31/2021 1004   EOSABS 0.1 10/31/2021 1004   BASOSABS 0.1 10/31/2021 1004      LFT's    Latest Ref Rng & Units 10/31/2021   10:04 AM 10/22/2019    5:09 PM 07/21/2019    3:42 PM  Hepatic Function  Total Protein 6.0 - 8.3 g/dL 6.5  6.3  6.1   Albumin 3.5 - 5.2 g/dL 3.9  4.2  3.8   AST 0 - 37 U/L '16  19  15   ' ALT 0 - 53 U/L '17  12  17   ' Alk Phosphatase 39 - 117 U/L 66  56  52   Total Bilirubin 0.2 - 1.2 mg/dL 1.4  1.2  1.0   Bilirubin, Direct 0.0 - 0.3 mg/dL 0.2  0.2      HRCT (11/01/21) - No significant interval change in pattern of mild pulmonary fibrosis featuring irregular peripheral interstitial opacity and mild, tubular bronchiectasis at the lung basis without evidence of significant subpleural bronchiolectasis or honeycombing. Findings remain consistent with an "indeterminate for UIP" pattern of fibrosis by pulmonary fibrosis criteria. Findings are indeterminate for UIP per consensus guidelines  Assessment and Plan  Esbriet Medication Management Thoroughly counseled patient on the efficacy, mechanism of action, dosing, administration, adverse effects, and monitoring parameters of Esbriet.  Patient verbalized understanding.   Goals of Therapy: Will not stop or reverse the progression of ILD. It will slow the progression of ILD.    Dosing: Starting dose will be Esbriet 267 mg 1 tablet three times daily for 7 days, then 2 tablets three times daily for 7 days, then 3 tablets three times daily.  Maintenance dose will be 801 mg 1 tablet three times daily if tolerated.  Stressed the importance of taking with meals and space at least 5-6 hours apart to minimize stomach upset.   Adverse Effects: Nausea, vomiting, diarrhea, weight loss - he states he has ondansetron at home. I did recommend that he not take this proactively and notify our clinic first if he has severe persistent nausea. Abdominal pain GERD Sun sensitivity/rash - patient advised to wear sunscreen when exposed to sunlight. He reports already minimal  exposure to sun due to other medications he is taking. Dizziness Fatigue  Monitoring: Monitor for diarrhea, nausea and vomiting, rash, hepatotoxicity  Monitor LFTs - baseline, monthly for first 6 months, then every 3 months routinely Future order for CMET placed today.  Access: Approval of Esbriet through: patient assistance Rx sent to: [place] [facility], dispensing) for [place]: [phone] He has first shipment of Esbriet scheduled to arrive to his home on Friday, 12/08/21  Medication Reconciliation A drug regimen assessment was performed, including review of allergies, interactions, disease-state management, dosing and immunization history. Medications were reviewed with the patient, including name, instructions, indication, goals of therapy, potential side effects, importance of adherence, and safe use.  No interactions with current medication list.  Has f/u appt with Rexene Edison, NP on 12/13/21 and with Dr. Chase Caller on 02/01/22  This appointment required 30 minutes of patient care (this includes precharting, chart review, review of results, face-to-face care, etc.).  Thank you for involving pharmacy to assist in providing this patient's care.

## 2021-12-07 NOTE — Telephone Encounter (Signed)
[name]  He is starting his pirfenidone 12/08/2021.  His next appointment with me is 02/01/2022.  He needs visit in approximately 4 weeks or 3 weeks at least a video visit with nurse practitioner to ensure the uptake with pirfenidone is going well and the liver function test

## 2021-12-08 ENCOUNTER — Telehealth: Payer: Self-pay | Admitting: Internal Medicine

## 2021-12-08 NOTE — Telephone Encounter (Signed)
Called patient and confirmed the MR wanted him to be seen after echo and CT scan. Rescheduled appt for August 31 at 2:30pm with TP. Nothing further needed

## 2021-12-13 ENCOUNTER — Ambulatory Visit: Payer: PPO | Admitting: Adult Health

## 2021-12-13 NOTE — Telephone Encounter (Signed)
Pt has an appt scheduled with TP 8/31.

## 2021-12-17 ENCOUNTER — Ambulatory Visit
Admission: EM | Admit: 2021-12-17 | Discharge: 2021-12-17 | Disposition: A | Discharge status: Home / Self Care | Payer: PPO | Attending: Nurse Practitioner | Admitting: Nurse Practitioner

## 2021-12-17 ENCOUNTER — Encounter: Payer: Self-pay | Admitting: Emergency Medicine

## 2021-12-17 ENCOUNTER — Ambulatory Visit (INDEPENDENT_AMBULATORY_CARE_PROVIDER_SITE_OTHER): Payer: PPO

## 2021-12-17 DIAGNOSIS — W19XXXA Unspecified fall, initial encounter: Secondary | ICD-10-CM

## 2021-12-17 DIAGNOSIS — M545 Low back pain, unspecified: Secondary | ICD-10-CM

## 2021-12-17 MED ORDER — ACETAMINOPHEN ER 650 MG PO TBCR: 650.0000 mg | EXTENDED_RELEASE_TABLET | Freq: Three times a day (TID) | ORAL | 0 refills | Status: DC | PRN

## 2021-12-17 MED ORDER — CAPSAICIN 0.025 % EX PTCH: 1.0000 | MEDICATED_PATCH | Freq: Two times a day (BID) | CUTANEOUS | 1 refills | Status: DC

## 2021-12-17 NOTE — ED Provider Notes (Signed)
[facility]    CSN: [id number] Arrival date & time: 12/17/21  1305      History   Chief Complaint No chief complaint on file.   HPI [name] is a 76 y.o. male.   The history is provided by the patient.   Patient presents after a fall in which he landed on his right lower back that occurred 1 day ago.  Patient states pain has persisted since the fall.  Pain worsens with bending, twisting, turning, and with coughing and deep breathing.  He denies numbness, tingling, or radiation of pain.  He also denies loss of bowel or bladder function.  Patient reports a history of spinal surgery that started approximately 6 years ago with his most recent surgery approximately 1 year ago.  Surgery includes cervical discectomy, lumbar laminectomy, and a lumbar fusion.  Patient states he is using Aspercreme patch to the area.  Past Medical History:  Diagnosis Date   Allergy to alpha-gal    2015; has been able to re-introduce red meat for the past 2 1/2 years without reaction (as of 01/21/19)   Arthritis    Chest pain    a. 03/2017: echo showing EF of 60-65%, no regional WMA or significant valve abnormalities. b. 03/2016: NST with no evidence of ischemia.    Complication of anesthesia    "anaphylactic" reaction to Xylocaine > 10 years ago; reported later recieved marcaine without issue   Difficult intubation 02/19/2018   No difficulty 01/26/19 with Mac and 3 or 11/10/19 with Glidescope   Digestive problems    on Prednisone prn for this issue- diarrhea   Dyspnea    with activity   Dysrhythmia    irregular due to Myocarditis- takes Atenolol, Norvasc   GERD (gastroesophageal reflux disease)    H/O viral myocarditis    25 years ago   Head injury, closed, with concussion    Breif LOC   Hyperlipidemia    Mild mitral valve prolapse    per Dr Evette Georges notes   PAF (paroxysmal atrial fibrillation) ([place])    a. initially occuring in 02/2016. b. recurrent in 07/2016. Placed on  Eliquis   Polymyalgia rheumatica (HCC)    Pre-diabetes    Pulmonary fibrosis ([place])    Sleep apnea    mild sleep apnea   no CPAP    Patient Active Problem List   Diagnosis Date Noted   Spinal stenosis of lumbar region with neurogenic claudication 12/22/2020   Pre-operative respiratory examination 12/13/2020   Cervical spondylosis with myelopathy 11/10/2019   Shortness of breath 08/04/2019   Colostomy in place ([place]) 01/26/2019   Prolonged Q-T interval on ECG 06/25/2018   Immunosuppression due to drug therapy for ILD 06/16/2018   Sigmoid colon perforation s/p Hartmann colectomy/colostomy 06/15/2018 06/15/2018   Perforated diverticulum of large intestine 06/15/2018   OSA (obstructive sleep apnea) 12/13/2016   Snoring 10/26/2016   PAF (paroxysmal atrial fibrillation) ([place]) 08/14/2016   ILD (interstitial lung disease) ([place]) 05/01/2016   Lumbar stenosis 02/01/2015   Giardia 04/14/2014   H/O viral cardiomyopathy 04/14/2014   Herniated nucleus pulposus, L3-4 right 05/05/2013   Other hypertrophic cardiomyopathy ([place]) 04/19/2013   Hyperlipidemia 04/19/2013   Essential hypertension 04/19/2013   Abnormal LFTs 09/16/2012   Nausea 07/10/2011   Chronic diarrhea 07/10/2011   Burping 07/10/2011   Osteoporosis 07/10/2011   Arthritis 07/10/2011    Past Surgical History:  Procedure Laterality Date   ANTERIOR CERVICAL DECOMP/DISCECTOMY FUSION N/A 11/10/2019   Procedure: Cervical Three-Four  Anterior cervical decompression/discectomy/fusion;  Surgeon: [name], MD;  Location: [place];  Service: Neurosurgery;  Laterality: N/A;  Cervical Three-Four Anterior cervical decompression/discectomy/fusion   apendectomy  1965   APPENDECTOMY     APPLICATION OF ROBOTIC ASSISTANCE FOR SPINAL PROCEDURE N/A 12/22/2020   Procedure: APPLICATION OF ROBOTIC ASSISTANCE FOR SPINAL PROCEDURE;  Surgeon: [name], MD;  Location: [place];  Service: Neurosurgery;  Laterality: N/A;   BACK SURGERY     bone spur  Bilateral 1999   feet   CARDIAC CATHETERIZATION  2001   CHOLECYSTECTOMY  2004   COLON RESECTION N/A 06/15/2018   Procedure: SIGMOID COLON RESECTION;  Surgeon: [name], MD;  Location: [facility];  Service: General;  Laterality: N/A;   COLONOSCOPY     COLOSTOMY N/A 06/15/2018   Procedure: COLOSTOMY;  Surgeon: [name], MD;  Location: [facility];  Service: General;  Laterality: N/A;   COLOSTOMY TAKEDOWN N/A 01/26/2019   Procedure: LAPAROSCOPIC ASSISTED COLOSTOMY TAKEDOWN;  Surgeon: [name], MD;  Location: [place];  Service: General;  Laterality: N/A;   EYE SURGERY Bilateral    cataract removal   LAPAROSCOPIC LYSIS OF ADHESIONS N/A 01/26/2019   Procedure: LAPAROSCOPIC LYSIS OF ADHESIONS;  Surgeon: [name], MD;  Location: [place];  Service: General;  Laterality: N/A;   LAPAROTOMY N/A 06/15/2018   Procedure: EXPLORATORY LAPAROTOMY;  Surgeon: [name], MD;  Location: [facility];  Service: General;  Laterality: N/A;   LUMBAR LAMINECTOMY/ DECOMPRESSION WITH MET-RX Right 05/05/2013   Procedure: Right Lumbar three-four Extraforaminal Microdiskectomy with Metrex;  Surgeon: [name], MD;  Location: [facility];  Service: Neurosurgery;  Laterality: Right;  Right Lumbar three-four Extraforaminal Microdiskectomy with Metrex   LUNG BIOPSY Right 02/19/2018   Procedure: LUNG BIOPSY;  Surgeon: [name], MD;  Location: [place];  Service: Thoracic;  Laterality: Right;   POSTERIOR LUMBAR FUSION 4 LEVEL N/A 12/22/2020   Procedure: Lumbar one-two Posterior lumbar interbody fusion with stabilization from Thoracic ten to Lumbar one with robotic screw placement;  Surgeon: [name], MD;  Location: [place];  Service: Neurosurgery;  Laterality: N/A;  RM 20   SHOULDER OPEN ROTATOR CUFF REPAIR Bilateral 2001   TONSILLECTOMY     VIDEO ASSISTED THORACOSCOPY Right 02/19/2018   Procedure: VIDEO ASSISTED THORACOSCOPY;  Surgeon: [name], MD;  Location: [place];  Service: Thoracic;   Laterality: Right;       Home Medications    Prior to Admission medications   Medication Sig Start Date End Date Taking? Authorizing Provider  acetaminophen (TYLENOL 8 HOUR) 650 MG CR tablet Take 1 tablet (650 mg total) by mouth every 8 (eight) hours as needed for pain. 12/17/21  Yes Teagen Mcleary-Warren, Alda Lea, NP  Capsaicin (CAPSAICIN TOPICAL PAIN PATCH) 0.025 % PTCH Apply 1 patch topically every 12 (twelve) hours. 12/17/21  Yes Kalicia Dufresne-Warren, Alda Lea, NP  budesonide-formoterol (SYMBICORT) 80-4.5 MCG/ACT inhaler Inhale 2 puffs into the lungs 2 (two) times daily as needed (shortness of breath).    [provider]  cetirizine (ZYRTEC) 10 MG tablet Take 10 mg by mouth daily as needed for allergies.     [provider]  Cholecalciferol (DIALYVITE VITAMIN D 5000) 125 MCG (5000 UT) capsule Take 5,000 Units by mouth daily.    [provider]  diltiazem (CARDIZEM CD) 120 MG 24 hr capsule Take 1 capsule (120 mg total) by mouth daily. 02/03/21   Marcelle Overlie D, RPH-CPP  diphenoxylate-atropine (LOMOTIL) 2.5-0.025 MG tablet Take  1 tablet by mouth 4 (four) times daily as needed for diarrhea or loose stools.    [provider]  ELIQUIS 5 MG TABS tablet TAKE ONE TABLET (5MG TOTAL) BY MOUTH TWODAILY Patient taking differently: Take 5 mg by mouth 2 (two) times daily. 01/28/18   Troy Sine, MD  ezetimibe (ZETIA) 10 MG tablet TAKE ONE (1) TABLET BY MOUTH EVERY DAY 07/06/21   Troy Sine, MD  fluticasone [facility]) 50 MCG/ACT nasal spray Place 2 sprays into both nostrils daily as needed for allergies or rhinitis.    [provider]  gabapentin (NEURONTIN) 300 MG capsule Take 300 mg by mouth 2 (two) times daily. 06/11/18   [provider]  hydroxychloroquine (PLAQUENIL) 200 MG tablet Take 200 mg by mouth 2 (two) times daily. 09/22/20   [provider]  metoprolol tartrate (LOPRESSOR) 25 MG tablet TAKE ONE TABLET BY MOUTH TWICE A DAY Patient taking  differently: Take 25 mg by mouth 2 (two) times daily. 02/25/18   Troy Sine, MD  nystatin (MYCOSTATIN) 100000 UNIT/ML suspension Take 5 mLs (500,000 Units total) by mouth every other day. 08/27/19   Noemi Chapel P, DO  ondansetron (ZOFRAN) 4 MG tablet Take 4 mg by mouth every 8 (eight) hours as needed for nausea or vomiting.     [provider]  Pirfenidone (ESBRIET) 267 MG TABS Take 1 tab three times daily for 7 days, then 2 tabs three times daily for 7 days, then 3 tabs three times daily thereafter. 12/06/21   Brand Males, MD  Pirfenidone (ESBRIET) 267 MG TABS Take 3 tablets (801 mg total) by mouth 3 (three) times daily with meals. 01/06/22   Brand Males, MD  predniSONE (DELTASONE) 5 MG tablet Take 5 mg by mouth daily.     [provider]  rosuvastatin (CRESTOR) 10 MG tablet TAKE ONE-HALF TABLET (5MG TOTAL) BY MOUTH ONCE A WEEK 02/10/21   Troy Sine, MD  tamsulosin (FLOMAX) 0.4 MG CAPS capsule Take 0.4 mg by mouth 2 (two) times daily.     [provider]  Trolamine Salicylate (ASPERCREME EX) Apply 1 application topically daily as needed (arthritis pain).    [provider]    Family History Family History  Problem Relation Age of Onset   Rheum arthritis Mother    Thyroid cancer Mother    Heart disease Father    Asthma Father    Thyroid cancer Sister    Melanoma Brother     Social History Social History   Tobacco Use   Smoking status: Never   Smokeless tobacco: Never  Vaping Use   Vaping Use: Never used  Substance Use Topics   Alcohol use: Yes    Alcohol/week: 21.0 standard drinks of alcohol    Types: 21 Glasses of wine per week    Comment: 2/3 glasses wine in evening   Drug use: No     Allergies   Xylocaine [lidocaine], Codeine, Statins, Aldactone [spironolactone], Alpha-gal, and Percocet [oxycodone-acetaminophen]   Review of Systems Review of Systems Per HPI  Physical Exam Triage Vital Signs ED Triage Vitals   Enc Vitals Group     BP 12/17/21 1356 118/75     Pulse Rate 12/17/21 1356 94     Resp 12/17/21 1356 18     Temp 12/17/21 1356 98.7 F (37.1 C)     Temp Source 12/17/21 1356 Oral     SpO2 12/17/21 1356 92 %     Weight --  Height --      Head Circumference --      Peak Flow --      Pain Score 12/17/21 1357 6     Pain Loc --      Pain Edu? --      Excl. in [place]? --    No data found.  Updated Vital Signs BP 118/75 (BP Location: Right Arm)   Pulse 94   Temp 98.7 F (37.1 C) (Oral)   Resp 18   SpO2 92%   Visual Acuity Right Eye Distance:   Left Eye Distance:   Bilateral Distance:    Right Eye Near:   Left Eye Near:    Bilateral Near:     Physical Exam Vitals and nursing note reviewed.  Constitutional:      General: He is not in acute distress.    Appearance: Normal appearance.  HENT:     Head: Normocephalic.  Eyes:     Extraocular Movements: Extraocular movements intact.     Conjunctiva/sclera: Conjunctivae normal.     Pupils: Pupils are equal, round, and reactive to light.  Cardiovascular:     Pulses: Normal pulses.     Heart sounds: Normal heart sounds.  Pulmonary:     Effort: Pulmonary effort is normal.     Breath sounds: Normal breath sounds.  Abdominal:     General: Bowel sounds are normal.     Palpations: Abdomen is soft.  Musculoskeletal:     Cervical back: Normal range of motion.     Thoracic back: Tenderness present. No swelling, edema, deformity, signs of trauma or spasms. Decreased range of motion.     Lumbar back: Signs of trauma and tenderness present. No swelling, edema, deformity or spasms. Decreased range of motion.  Lymphadenopathy:     Cervical: No cervical adenopathy.  Neurological:     General: No focal deficit present.     Mental Status: He is alert.  Psychiatric:        Mood and Affect: Mood normal.        Behavior: Behavior normal.      UC Treatments / Results  Labs (all labs ordered are listed, but only abnormal results  are displayed) Labs Reviewed - No data to display  EKG   Radiology DG Lumbar Spine Complete  Result Date: 12/17/2021 CLINICAL DATA:  Fall.  Right-sided low back pain. EXAM: LUMBAR SPINE - COMPLETE 4+ VIEW COMPARISON:  Lumbar spine radiographs 08/23/2021 FINDINGS: There are 5 non rib-bearing lumbar vertebrae. Sequelae of T10-L5 posterior fusion and L1-L5 interbody fusion are again identified. Vertebral alignment is unchanged, and there is no significant listhesis. A chronic L1 compression fracture is unchanged. No acute fracture is identified. Right upper quadrant abdominal surgical clips are noted. IMPRESSION: No acute osseous abnormality identified. Extensive thoracolumbar fusion. Electronically Signed   By: Logan Bores M.D.   On: 12/17/2021 14:44    Procedures Procedures (including critical care time)  Medications Ordered in UC Medications - No data to display  Initial Impression / Assessment and Plan / UC Course  I have reviewed the triage vital signs and the nursing notes.  Pertinent labs & imaging results that were available during my care of the patient were reviewed by me and considered in my medical decision making (see chart for details).  Patient presents status post fall onto his right side that occurred 1 day ago.  On exam, patient has tenderness to the right paraspinal region.  There is no obvious bruising or deformity  noted.  X-rays were negative for any acute osseous abnormalities.  Patient has a chronic history of spinal pain with surgical repair.  Patient was prescribed capsaicin patches along with continued use of Tylenol.  Supportive care recommendations were provided to the patient.  Patient was advised that if his symptoms do not improve within the next 1 to 2 weeks, recommend that he follow-up with his orthopedic surgeon for further evaluation.  Patient was given strict indications of when to go to the emergency department. Final Clinical Impressions(s) / UC Diagnoses    Final diagnoses:  Right-sided low back pain without sciatica, unspecified chronicity  Fall, initial encounter     Discharge Instructions      There are no new fractures at this time. Use medication as prescribed. Apply ice or heat as needed.  Apply ice for pain or swelling, heat for spasm or stiffness remove.  Apply for 20 minutes, remove for 1 hour, then repeat as needed. Gentle stretching and range of motion exercises to help decrease recovery time. As discussed, if your symptoms have not improved within the next 1 to 2 weeks, please follow-up with your orthopedic surgeon. Follow-up as needed.     ED Prescriptions     Medication Sig Dispense Auth. Provider   acetaminophen (TYLENOL 8 HOUR) 650 MG CR tablet Take 1 tablet (650 mg total) by mouth every 8 (eight) hours as needed for pain. 30 tablet Westyn Driggers-Warren, Alda Lea, NP   Capsaicin (CAPSAICIN TOPICAL PAIN PATCH) 0.025 % PTCH Apply 1 patch topically every 12 (twelve) hours. 10 patch Daira Hine-Warren, Alda Lea, NP      PDMP not reviewed this encounter.   Tish Men, NP 12/17/21 1550

## 2021-12-17 NOTE — Discharge Instructions (Addendum)
There are no new fractures at this time. Use medication as prescribed. Apply ice or heat as needed.  Apply ice for pain or swelling, heat for spasm or stiffness remove.  Apply for 20 minutes, remove for 1 hour, then repeat as needed. Gentle stretching and range of motion exercises to help decrease recovery time. As discussed, if your symptoms have not improved within the next 1 to 2 weeks, please follow-up with your orthopedic surgeon. Follow-up as needed.

## 2021-12-17 NOTE — ED Triage Notes (Signed)
Fell yesterday and landed on back.  Pain on right side of mid back.

## 2021-12-21 DIAGNOSIS — E785 Hyperlipidemia, unspecified: Secondary | ICD-10-CM | POA: Diagnosis not present

## 2021-12-21 DIAGNOSIS — E1169 Type 2 diabetes mellitus with other specified complication: Secondary | ICD-10-CM | POA: Diagnosis not present

## 2021-12-25 ENCOUNTER — Telehealth: Payer: Self-pay | Admitting: Internal Medicine

## 2021-12-25 NOTE — Telephone Encounter (Signed)
Called and spoke to [facility] and she states that the patient was feeling bad on Saturday with just a sore and scratchy throat. But yesterday 12/24/21 he started to feel worse and he tested positive for Covid. She states he is having coughing with mucus production, full body aches, sinus congestion. No fever, oxygen is at 93%.   FYI patient is on Eliquis    Please advise [name]

## 2021-12-25 NOTE — Telephone Encounter (Signed)
PAXLOVID   Paxlovid (nirmatelvir 300/Ritonavir100) - BID x 5 days - for GFR >= 60  - [place] [facility] [address] called in and they are out -> so called into [facility] 6:21 PM [facility] [place]  [phone] Open ? Closes 10?PM Pharmacy: Open ? Closes 8?PM  Hold statin while taking it      Latest Reference Range & Units 01/22/07 07:20 04/30/13 13:32 01/24/15 14:38 04/02/16 14:35 07/16/16 09:02 08/11/16 05:05 09/25/16 17:07 09/25/16 17:13 12/16/17 10:30 02/17/18 14:04 02/20/18 04:38 02/21/18 05:58 04/14/18 12:29 06/15/18 13:55 06/16/18 03:36 06/17/18 02:42 06/18/18 04:26 06/19/18 04:04 06/20/18 04:10 06/21/18 02:50 06/22/18 03:13 06/23/18 04:47 06/24/18 05:15 01/15/19 15:40 01/27/19 01:05 01/29/19 01:07 07/21/19 15:42 10/22/19 17:09 11/06/19 14:00 07/29/20 10:39 12/13/20 12:30 12/23/20 04:29 10/31/21 10:04  Creatinine 0.40 - 1.50 mg/dL 0.99 0.97 0.87 0.91 0.95 1.03 1.06 1.10 0.88 0.99 0.66 0.80 0.76 1.01 0.75 0.68 0.65 0.60 (L) 0.68 0.70 0.64 0.73 0.77 0.94 1.02 0.66 0.91 0.86 0.83 0.98 1.04 1.18 0.93  (L): Data is abnormally low   PLEASE CHECK MED LIST for the following issues. Please check the 2 different condition related to concomitant medications in alphabetical order  If [place] with DOB [birth year] is on any of the following Strong CYP3A inhibtors - this patient Ryne Mctigue Spicer should withold these concomitant meds so they can start paxlovid stratight away. If taking any of these:  alfuzosin, amiodarone, clozapine, colchicine, dihydroergotamine, dronedarone, ergotamine, flecainide, lovastatin, lurasidone, methylergonovine, midazolam [oral], pethidine, pimozide, propafenone, propoxyphene, quinidine, ranolazine, sildenafil simvastatin, triazolam).   If   [place]  with dob 10-16-1945 Is on any of these other strong CYP3A inducers then starting paxlovid should be delayed and the following meds should wash out first.  These are dapalutamide, carbamazepine, phenobarbital, phenytoin, rifampin, St John's wort) - let me know immediately and we should delay starting paxlovid by some days even if he stops these medication.    PLEASE INFORM Gatlyn Siler Kugel  OF FOLLOWING SIDE EFFECTS  Side effects - all < 5%  - skin rash (and veyr rare a conditon called TEN) - angiomedia  - myalgia - jaundice - high bP (1%) - loss of taste  - diarrhea - 25% rebound     Current Outpatient Medications:    acetaminophen (TYLENOL 8 HOUR) 650 MG CR tablet, Take 1 tablet (650 mg total) by mouth every 8 (eight) hours as needed for pain., Disp: 30 tablet, Rfl: 0   budesonide-formoterol (SYMBICORT) 80-4.5 MCG/ACT inhaler, Inhale 2 puffs into the lungs 2 (two) times daily as needed (shortness of breath)., Disp: , Rfl:    Capsaicin (CAPSAICIN TOPICAL PAIN PATCH) 0.025 % PTCH, Apply 1 patch topically every 12 (twelve) hours., Disp: 10 patch, Rfl: 1   cetirizine (ZYRTEC) 10 MG tablet, Take 10 mg by mouth daily as needed for allergies. , Disp: , Rfl:    Cholecalciferol (DIALYVITE VITAMIN D 5000) 125 MCG (5000 UT) capsule, Take 5,000 Units by mouth daily., Disp: , Rfl:    diltiazem (CARDIZEM CD) 120 MG 24 hr capsule, Take 1 capsule (120 mg total) by mouth daily., Disp: 90 capsule, Rfl: 3   diphenoxylate-atropine (LOMOTIL) 2.5-0.025 MG tablet, Take 1 tablet by mouth 4 (four) times daily as needed for diarrhea or loose stools., Disp: , Rfl:    ELIQUIS 5 MG TABS tablet, TAKE ONE TABLET ('5MG'$  TOTAL) BY MOUTH TWODAILY (Patient taking differently: Take 5 mg by mouth 2 (two) times daily.), Disp:  180 tablet, Rfl: 1   ezetimibe (ZETIA) 10 MG tablet, TAKE ONE (1) TABLET BY MOUTH EVERY DAY, Disp: 90 tablet, Rfl: 3   fluticasone (FLONASE) 50 MCG/ACT nasal spray, Place 2 sprays into both nostrils daily as needed for allergies or rhinitis., Disp: , Rfl:    gabapentin (NEURONTIN) 300 MG capsule, Take 300 mg by mouth 2 (two) times daily., Disp: , Rfl:     hydroxychloroquine (PLAQUENIL) 200 MG tablet, Take 200 mg by mouth 2 (two) times daily., Disp: , Rfl:    metoprolol tartrate (LOPRESSOR) 25 MG tablet, TAKE ONE TABLET BY MOUTH TWICE A DAY (Patient taking differently: Take 25 mg by mouth 2 (two) times daily.), Disp: 180 tablet, Rfl: 3   nystatin (MYCOSTATIN) 100000 UNIT/ML suspension, Take 5 mLs (500,000 Units total) by mouth every other day., Disp: 437 mL, Rfl: 11   ondansetron (ZOFRAN) 4 MG tablet, Take 4 mg by mouth every 8 (eight) hours as needed for nausea or vomiting. , Disp: , Rfl:    Pirfenidone (ESBRIET) 267 MG TABS, Take 1 tab three times daily for 7 days, then 2 tabs three times daily for 7 days, then 3 tabs three times daily thereafter., Disp: 207 tablet, Rfl: 0   [START ON 01/06/2022] Pirfenidone (ESBRIET) 267 MG TABS, Take 3 tablets (801 mg total) by mouth 3 (three) times daily with meals., Disp: 270 tablet, Rfl: 0   predniSONE (DELTASONE) 5 MG tablet, Take 5 mg by mouth daily. , Disp: , Rfl:    rosuvastatin (CRESTOR) 10 MG tablet, TAKE ONE-HALF TABLET ('5MG'$  TOTAL) BY MOUTH ONCE A WEEK, Disp: 20 tablet, Rfl: 2   tamsulosin (FLOMAX) 0.4 MG CAPS capsule, Take 0.4 mg by mouth 2 (two) times daily. , Disp: , Rfl:    Trolamine Salicylate (ASPERCREME EX), Apply 1 application topically daily as needed (arthritis pain)., Disp: , Rfl:

## 2021-12-26 ENCOUNTER — Ambulatory Visit (HOSPITAL_COMMUNITY): Payer: PPO

## 2021-12-31 ENCOUNTER — Telehealth: Payer: Self-pay | Admitting: Internal Medicine

## 2021-12-31 NOTE — Telephone Encounter (Signed)
His bili was slightly high normal 2 months ago. He is on esbreit  Plan  - recheck LFT this week. Can be at [facility]    Dr. Brand Males, M.D., F.C.C.P,  Pulmonary and Critical Care Medicine Staff Physician, [facility] Director - Interstitial Lung Disease  Program  Medical Director - [place] [facility] [place] at [place], [place], [address]  NPI Number:  NPI #[id number] [facility] Number: [id number]  Pager: [phone], If no answer  -> Check AMION or Try [facility] Telephone (clinical office): [phone] Telephone (research): [phone]  1:55 AM 12/31/2021

## 2022-01-01 DIAGNOSIS — G4700 Insomnia, unspecified: Secondary | ICD-10-CM | POA: Diagnosis not present

## 2022-01-01 DIAGNOSIS — G4730 Sleep apnea, unspecified: Secondary | ICD-10-CM | POA: Diagnosis not present

## 2022-01-01 DIAGNOSIS — J849 Interstitial pulmonary disease, unspecified: Secondary | ICD-10-CM | POA: Diagnosis not present

## 2022-01-01 DIAGNOSIS — I482 Chronic atrial fibrillation, unspecified: Secondary | ICD-10-CM | POA: Diagnosis not present

## 2022-01-01 DIAGNOSIS — I2510 Atherosclerotic heart disease of native coronary artery without angina pectoris: Secondary | ICD-10-CM | POA: Diagnosis not present

## 2022-01-01 DIAGNOSIS — M791 Myalgia, unspecified site: Secondary | ICD-10-CM | POA: Diagnosis not present

## 2022-01-01 DIAGNOSIS — M353 Polymyalgia rheumatica: Secondary | ICD-10-CM | POA: Diagnosis not present

## 2022-01-01 DIAGNOSIS — E1169 Type 2 diabetes mellitus with other specified complication: Secondary | ICD-10-CM | POA: Diagnosis not present

## 2022-01-01 DIAGNOSIS — E785 Hyperlipidemia, unspecified: Secondary | ICD-10-CM | POA: Diagnosis not present

## 2022-01-01 DIAGNOSIS — R0600 Dyspnea, unspecified: Secondary | ICD-10-CM | POA: Diagnosis not present

## 2022-01-01 DIAGNOSIS — I10 Essential (primary) hypertension: Secondary | ICD-10-CM | POA: Diagnosis not present

## 2022-01-01 DIAGNOSIS — M25519 Pain in unspecified shoulder: Secondary | ICD-10-CM | POA: Diagnosis not present

## 2022-01-01 NOTE — Telephone Encounter (Signed)
Called and spoke with pt to see where he had bloodwork done on 8/16 and pt said bloodwork was done at PCP office. Stated to pt that we would call to request a copy of those results and he verbalized understanding.   Called pt's PCP office but unable to reach. Left a detailed message for Dr. Juel Burrow nurse to have the records faxed over to our office.

## 2022-01-01 NOTE — Telephone Encounter (Signed)
Attempted to call pt but unable to reach. Left message for him to return call. °

## 2022-01-08 ENCOUNTER — Ambulatory Visit (HOSPITAL_COMMUNITY)
Admission: RE | Admit: 2022-01-08 | Discharge: 2022-01-08 | Disposition: A | Discharge status: Home / Self Care | Payer: PPO | Source: Ambulatory Visit | Attending: Internal Medicine | Admitting: Internal Medicine

## 2022-01-08 DIAGNOSIS — J849 Interstitial pulmonary disease, unspecified: Secondary | ICD-10-CM | POA: Insufficient documentation

## 2022-01-08 DIAGNOSIS — I4891 Unspecified atrial fibrillation: Secondary | ICD-10-CM | POA: Diagnosis not present

## 2022-01-08 DIAGNOSIS — R7303 Prediabetes: Secondary | ICD-10-CM | POA: Diagnosis not present

## 2022-01-08 DIAGNOSIS — R0600 Dyspnea, unspecified: Secondary | ICD-10-CM | POA: Diagnosis not present

## 2022-01-08 DIAGNOSIS — E785 Hyperlipidemia, unspecified: Secondary | ICD-10-CM | POA: Diagnosis not present

## 2022-01-08 DIAGNOSIS — R0609 Other forms of dyspnea: Secondary | ICD-10-CM | POA: Diagnosis not present

## 2022-01-08 DIAGNOSIS — I10 Essential (primary) hypertension: Secondary | ICD-10-CM | POA: Diagnosis not present

## 2022-01-08 DIAGNOSIS — G4733 Obstructive sleep apnea (adult) (pediatric): Secondary | ICD-10-CM | POA: Diagnosis not present

## 2022-01-08 DIAGNOSIS — I422 Other hypertrophic cardiomyopathy: Secondary | ICD-10-CM | POA: Diagnosis not present

## 2022-01-08 LAB — ECHOCARDIOGRAM COMPLETE
Area-P 1/2: 4.79 cm2
S' Lateral: 3.1 cm

## 2022-01-08 NOTE — Progress Notes (Signed)
*  PRELIMINARY RESULTS* Echocardiogram 2D Echocardiogram has been performed.  Samuel Germany 01/08/2022, 11:24 AM

## 2022-01-09 ENCOUNTER — Ambulatory Visit (HOSPITAL_COMMUNITY)
Admission: RE | Admit: 2022-01-09 | Discharge: 2022-01-09 | Disposition: A | Discharge status: Home / Self Care | Payer: PPO | Source: Ambulatory Visit | Attending: Internal Medicine | Admitting: Internal Medicine

## 2022-01-09 DIAGNOSIS — J8410 Pulmonary fibrosis, unspecified: Secondary | ICD-10-CM | POA: Diagnosis not present

## 2022-01-09 DIAGNOSIS — Z9049 Acquired absence of other specified parts of digestive tract: Secondary | ICD-10-CM | POA: Diagnosis not present

## 2022-01-09 DIAGNOSIS — J479 Bronchiectasis, uncomplicated: Secondary | ICD-10-CM | POA: Insufficient documentation

## 2022-01-09 DIAGNOSIS — I700 Atherosclerosis of aorta: Secondary | ICD-10-CM | POA: Diagnosis not present

## 2022-01-09 DIAGNOSIS — J84112 Idiopathic pulmonary fibrosis: Secondary | ICD-10-CM | POA: Diagnosis not present

## 2022-01-09 DIAGNOSIS — J849 Interstitial pulmonary disease, unspecified: Secondary | ICD-10-CM | POA: Insufficient documentation

## 2022-01-10 NOTE — Progress Notes (Signed)
[place] PFT decline, radiologist says no change in ILD since 2017. We wills ee how followup PFT reveal

## 2022-01-10 NOTE — Progress Notes (Signed)
Mild heart muscle stiffness can contriube to dyspnea. But otherwise normal

## 2022-01-11 ENCOUNTER — Encounter: Payer: Self-pay | Admitting: Adult Health

## 2022-01-11 ENCOUNTER — Ambulatory Visit (INDEPENDENT_AMBULATORY_CARE_PROVIDER_SITE_OTHER): Payer: PPO | Admitting: Adult Health

## 2022-01-11 DIAGNOSIS — J849 Interstitial pulmonary disease, unspecified: Secondary | ICD-10-CM

## 2022-01-11 NOTE — Assessment & Plan Note (Signed)
ILD progressive phenotype (unclassifiable ILD)- Recent high-resolution CT chest shows no significant change.  Stable fibrosis.  2D echo shows no significant pulmonary hypertension.  Preserved EF.  Patient has PFTs pending for next month.  Clinically appears to be about the same.  Unfortunately has been unable to tolerate Ofev or Esbriet.  Plan  Patient Instructions  Activity as tolerated  Follow up with Dr. Chase Caller with PFT as planned next month and As needed

## 2022-01-11 NOTE — Patient Instructions (Signed)
Activity as tolerated  Follow up with Dr. Chase Caller with PFT as planned next month and As needed

## 2022-01-11 NOTE — Progress Notes (Signed)
_0  ID: [name], male    DOB: 12-Feb-1946, 76 y.o.   MRN: [id number]  Chief Complaint  Patient presents with   Follow-up    Pt states he stopped the Pirfenidone because he had loose stools and nausea.    Referring provider: [name], MD  HPI: 76 year old male never smoker followed for interstitial lung disease with progressive phenotype (unclassifiable ILD) Medical history significant for obstructive sleep apnea CPAP intolerant, hypertension, A-fib History of perforated sigmoid diverticulitis early February 2021 few weeks after starting nintedanib/CellCept             -Status post reversal of colostomy in September 2020  TEST/EVENTS :  status post surgical lung biopsy February 19, 2018 (pathology read at [facility] : -unclassifiable ILD,same [place] interstitial lung disease conference June 2021) suggestive  UIP due to presence of fibroblastic foci             -Mild elevated positive rheumatoid factor  in 2019 : considered progressive pre-bx (on PFT/CT per notes)             - in 2019 - clinical CTD ruled out by Randall. Esmeralda Roberts record review_             -High-resolution CT March 2021: Indeterminate for UIP without change since 2017 Mid-endJan 2020 -s tarted cellcept/bactrim + ofev (course complicated early February 2020 by diverticulitis and intermittent side effects with stop start of the nintedanib)             - stoppe cellcept June 2021 due to diarrhea/immune suppressino             - off ofev aug 2021 due to severe diarrhea   Pathological tissue mild emphysema present on surgical lung biopsy October 2019 [not evident on pulmonary function testing]             - Alpha 1 - MM:  Oct 2019             -On inhaler therapy             -No obstruction seen on PFT or emphysema on CT  01/11/2022 Follow up : ILD  Patient returns 80-monthfollow-up.  Patient has underlying interstitial lung disease.  Last visit patient was started on Esbriet.  Patient says he  tried 3 separate occasions to take Esbriet but was unable to tolerate due to severe GI symptoms and diarrhea.  He was set up for high-resolution CT chest that was done on January 09, 2022 that showed no significant interval change in his mild pulmonary fibrosis with a pattern apical to basilar gradient. 2D echo showed preserved EF, grade 1 diastolic function, right ventricular systolic function normal.  RV size is normal.  Labs were unrevealing.  BNP was normal. Patient says overall he is doing okay.  He tries to stay active with light chores around the house.  Patient denies any flare of cough or wheezing.  Has had no decrease in activity tolerance. Patient has PFTs planned for next month. Patient has tried Ofev in the past and was unable to tolerate due GI side effects. Patient says since last visit he is not worse or better he is about the same.  He gets winded with heavy activities.   Allergies  Allergen Reactions   Xylocaine [Lidocaine] Anaphylaxis    Injection form   Codeine Nausea And Vomiting   Statins Other (See Comments)    Muscle soreness and an aching feeling all over  Myalgias   Aldactone [Spironolactone]     Sore nipples and chest   Alpha-Gal     2015; has been able to re-introduce red meat for the past 2 1/2 years without reaction (as of 01/21/19)   Percocet [Oxycodone-Acetaminophen] Itching    Immunization History  Administered Date(s) Administered   Fluad Quad(high Dose 65+) 02/09/2021   Influenza Split 03/20/2016   Influenza, High Dose Seasonal PF 01/29/2018, 02/25/2019, 03/20/2019, 12/13/2019   Influenza,inj,Quad PF,6+ Mos 03/12/2017   Moderna Sars-Covid-2 Vaccination 06/04/2019, 07/10/2019, 03/18/2020   Pneumococcal Conjugate-13 01/10/2018    Past Medical History:  Diagnosis Date   Allergy to alpha-gal    2015; has been able to re-introduce red meat for the past 2 1/2 years without reaction (as of 01/21/19)   Arthritis    Chest pain    a. 03/2017: echo showing EF  of 60-65%, no regional WMA or significant valve abnormalities. b. 03/2016: NST with no evidence of ischemia.    Complication of anesthesia    "anaphylactic" reaction to Xylocaine > 10 years ago; reported later recieved marcaine without issue   Difficult intubation 02/19/2018   No difficulty 01/26/19 with Mac and 3 or 11/10/19 with Glidescope   Digestive problems    on Prednisone prn for this issue- diarrhea   Dyspnea    with activity   Dysrhythmia    irregular due to Myocarditis- takes Atenolol, Norvasc   GERD (gastroesophageal reflux disease)    H/O viral myocarditis    25 years ago   Head injury, closed, with concussion    Breif LOC   Hyperlipidemia    Mild mitral valve prolapse    per [name] notes   PAF (paroxysmal atrial fibrillation) ([place])    a. initially occuring in 02/2016. b. recurrent in 07/2016. Placed on Eliquis   Polymyalgia rheumatica (HCC)    Pre-diabetes    Pulmonary fibrosis (HCC)    Sleep apnea    mild sleep apnea   no CPAP    Tobacco History: Social History   Tobacco Use  Smoking Status Never  Smokeless Tobacco Never   Counseling given: Not Answered   Outpatient Medications Prior to Visit  Medication Sig Dispense Refill   acetaminophen (TYLENOL 8 HOUR) 650 MG CR tablet Take 1 tablet (650 mg total) by mouth every 8 (eight) hours as needed for pain. 30 tablet 0   budesonide-formoterol (SYMBICORT) 80-4.5 MCG/ACT inhaler Inhale 2 puffs into the lungs 2 (two) times daily as needed (shortness of breath).     Capsaicin (CAPSAICIN TOPICAL PAIN PATCH) 0.025 % PTCH Apply 1 patch topically every 12 (twelve) hours. 10 patch 1   cetirizine (ZYRTEC) 10 MG tablet Take 10 mg by mouth daily as needed for allergies.      Cholecalciferol (DIALYVITE VITAMIN D 5000) 125 MCG (5000 UT) capsule Take 5,000 Units by mouth daily.     diltiazem (CARDIZEM CD) 120 MG 24 hr capsule Take 1 capsule (120 mg total) by mouth daily. 90 capsule 3   diphenoxylate-atropine (LOMOTIL)  2.5-0.025 MG tablet Take 1 tablet by mouth 4 (four) times daily as needed for diarrhea or loose stools.     ELIQUIS 5 MG TABS tablet TAKE ONE TABLET (5MG TOTAL) BY MOUTH TWODAILY (Patient taking differently: Take 5 mg by mouth 2 (two) times daily.) 180 tablet 1   ezetimibe (ZETIA) 10 MG tablet TAKE ONE (1) TABLET BY MOUTH EVERY DAY 90 tablet 3   fluticasone (FLONASE) 50 MCG/ACT nasal spray Place 2 sprays into both  nostrils daily as needed for allergies or rhinitis.     gabapentin (NEURONTIN) 300 MG capsule Take 300 mg by mouth 2 (two) times daily.     hydroxychloroquine (PLAQUENIL) 200 MG tablet Take 200 mg by mouth 2 (two) times daily.     metoprolol tartrate (LOPRESSOR) 25 MG tablet TAKE ONE TABLET BY MOUTH TWICE A DAY (Patient taking differently: Take 25 mg by mouth 2 (two) times daily.) 180 tablet 3   nystatin (MYCOSTATIN) 100000 UNIT/ML suspension Take 5 mLs (500,000 Units total) by mouth every other day. 437 mL 11   ondansetron (ZOFRAN) 4 MG tablet Take 4 mg by mouth every 8 (eight) hours as needed for nausea or vomiting.      predniSONE (DELTASONE) 5 MG tablet Take 5 mg by mouth daily.      rosuvastatin (CRESTOR) 10 MG tablet TAKE ONE-HALF TABLET (5MG TOTAL) BY MOUTH ONCE A WEEK 20 tablet 2   tamsulosin (FLOMAX) 0.4 MG CAPS capsule Take 0.4 mg by mouth 2 (two) times daily.      Trolamine Salicylate (ASPERCREME EX) Apply 1 application topically daily as needed (arthritis pain).     Pirfenidone (ESBRIET) 267 MG TABS Take 1 tab three times daily for 7 days, then 2 tabs three times daily for 7 days, then 3 tabs three times daily thereafter. (Patient not taking: Reported on 01/11/2022) 207 tablet 0   Pirfenidone (ESBRIET) 267 MG TABS Take 3 tablets (801 mg total) by mouth 3 (three) times daily with meals. (Patient not taking: Reported on 01/11/2022) 270 tablet 0   No facility-administered medications prior to visit.     Review of Systems:   Constitutional:   No  weight loss, night sweats,   Fevers, chills,  +fatigue, or  lassitude.  HEENT:   No headaches,  Difficulty swallowing,  Tooth/dental problems, or  Sore throat,                No sneezing, itching, ear ache, nasal congestion, post nasal drip,   CV:  No chest pain,  Orthopnea, PND, swelling in lower extremities, anasarca, dizziness, palpitations, syncope.   GI  No heartburn, indigestion, abdominal pain, nausea, vomiting, diarrhea, change in bowel habits, loss of appetite, bloody stools.   Resp:  No chest wall deformity  Skin: no rash or lesions.  GU: no dysuria, change in color of urine, no urgency or frequency.  No flank pain, no hematuria   MS:  No joint pain or swelling.  No decreased range of motion.  No back pain.    Physical Exam  BP 118/64 (BP Location: Left Arm, Patient Position: Sitting, Cuff Size: Large)   Pulse 80   Temp 98.3 F (36.8 C) (Oral)   Ht _0  (1.727 m)   Wt 189 lb 6.4 oz (85.9 kg)   SpO2 93%   BMI 28.80 kg/m   GEN: A/Ox3; pleasant , NAD, well nourished    HEENT:  [place]/AT,  NOSE-clear, THROAT-clear, no lesions, no postnasal drip or exudate noted.   NECK:  Supple w/ fair ROM; no JVD; normal carotid impulses w/o bruits; no thyromegaly or nodules palpated; no lymphadenopathy.    RESP faint bibasilar crackles no accessory muscle use, no dullness to percussion  CARD:  RRR, no m/r/g, no peripheral edema, pulses intact, no cyanosis or clubbing.  GI:   Soft & nt; nml bowel sounds; no organomegaly or masses detected.   Musco: Warm bil, no deformities or joint swelling noted.   Neuro: alert, no focal deficits  noted.    Skin: Warm, no lesions or rashes    Lab Results:      Imaging: CT Chest High Resolution  Result Date: 01/09/2022 CLINICAL DATA:  Interstitial lung disease EXAM: CT CHEST WITHOUT CONTRAST TECHNIQUE: Multidetector CT imaging of the chest was performed following the standard protocol without intravenous contrast. High resolution imaging of the lungs, as well as  inspiratory and expiratory imaging, was performed. RADIATION DOSE REDUCTION: This exam was performed according to the departmental dose-optimization program which includes automated exposure control, adjustment of the mA and/or kV according to patient size and/or use of iterative reconstruction technique. COMPARISON:  06/29/2020, 07/30/2019, 04/02/2016 FINDINGS: Cardiovascular: Aortic atherosclerosis. Normal heart size. No pericardial effusion. Mediastinum/Nodes: No enlarged mediastinal, hilar, or axillary lymph nodes. Thyroid gland, trachea, and esophagus demonstrate no significant findings. Lungs/Pleura: Evidence of prior right lung wedge biopsy. No significant interval change in mild pulmonary fibrosis in a pattern with apical to basal gradient, featuring irregular peripheral interstitial opacity and mild, predominantly tubular bronchiectasis without evidence of significant traction, subpleural bronchiolectasis, or honeycombing. No significant air trapping on expiratory phase imaging. No pleural effusion or pneumothorax. Upper Abdomen: No acute abnormality.  Status post cholecystectomy. Musculoskeletal: No chest wall abnormality. No acute osseous findings. IMPRESSION: 1. No significant interval change in mild pulmonary fibrosis in a pattern with apical to basal gradient, featuring irregular peripheral interstitial opacity and mild, predominantly tubular bronchiectasis without evidence of significant traction, subpleural bronchiolectasis, or honeycombing. Findings remain indeterminate for UIP per consensus guidelines: Diagnosis of Idiopathic Pulmonary Fibrosis: An Official ATS/ERS/JRS/ALAT Clinical Practice Guideline. [place], Iss 5, [id number], Jan 12 2017. 2. Evidence of prior right lung wedge biopsy. Aortic Atherosclerosis (ICD10-I70.0). Electronically Signed   By: Delanna Ahmadi M.D.   On: 01/09/2022 17:44   ECHOCARDIOGRAM COMPLETE  Result Date: 01/08/2022    ECHOCARDIOGRAM REPORT    Patient Name:   [name] [facility] Date of Exam: 01/08/2022 Medical Rec #:  [id number]             Height:       68.0 in Accession #:    [id number]            Weight:       183.4 lb Date of Birth:  1946/04/25             BSA:          1.970 m Patient Age:    56 years              BP:           145/84 mmHg Patient Gender: M                     HR:           77 bpm. Exam Location:  [facility] Procedure: 2D Echo, Cardiac Doppler and Color Doppler Indications:    J84.9 (ICD-10-CM) - ILD (interstitial lung disease), R06.00                 Dyspnea  History:        Patient has prior history of Echocardiogram examinations, most                 recent 09/13/2020. Arrythmias:Atrial Fibrillation; Risk                 Factors:Hypertension, Dyslipidemia and Pre-diabetes. Obstructive  sleep apnea. Hypertrophic cardiomyopathy. History of viral                 cardiomyopathy. ILD (interstitial lung disease),.  Sonographer:    Alvino Chapel RCS Referring Phys: [place]  1. Left ventricular ejection fraction, by estimation, is 55 to 60%. The left ventricle has normal function. The left ventricle has no regional wall motion abnormalities. Left ventricular diastolic parameters are consistent with Grade I diastolic dysfunction (impaired relaxation).  2. Right ventricular systolic function is normal. The right ventricular size is normal.  3. The mitral valve is normal in structure. No evidence of mitral valve regurgitation. No evidence of mitral stenosis.  4. The aortic valve is tricuspid. Aortic valve regurgitation is not visualized. No aortic stenosis is present.  5. The inferior vena cava is normal in size with greater than 50% respiratory variability, suggesting right atrial pressure of 3 mmHg. Comparison(s): No significant change from prior study. Prior images reviewed side by side. Conclusion(s)/Recommendation(s): Normal biventricular function without evidence of hemodynamically significant  valvular heart disease. FINDINGS  Left Ventricle: Left ventricular ejection fraction, by estimation, is 55 to 60%. The left ventricle has normal function. The left ventricle has no regional wall motion abnormalities. The left ventricular internal cavity size was normal in size. There is  no left ventricular hypertrophy. Left ventricular diastolic parameters are consistent with Grade I diastolic dysfunction (impaired relaxation). Right Ventricle: The right ventricular size is normal. No increase in right ventricular wall thickness. Right ventricular systolic function is normal. Left Atrium: Left atrial size was normal in size. Right Atrium: Right atrial size was normal in size. Pericardium: There is no evidence of pericardial effusion. Mitral Valve: The mitral valve is normal in structure. No evidence of mitral valve regurgitation. No evidence of mitral valve stenosis. Tricuspid Valve: The tricuspid valve is normal in structure. Tricuspid valve regurgitation is not demonstrated. No evidence of tricuspid stenosis. Aortic Valve: The aortic valve is tricuspid. Aortic valve regurgitation is not visualized. No aortic stenosis is present. Pulmonic Valve: The pulmonic valve was normal in structure. Pulmonic valve regurgitation is mild. No evidence of pulmonic stenosis. Aorta: The aortic root is normal in size and structure. Venous: The inferior vena cava is normal in size with greater than 50% respiratory variability, suggesting right atrial pressure of 3 mmHg. IAS/Shunts: No atrial level shunt detected by color flow Doppler.  LEFT VENTRICLE PLAX 2D LVIDd:         4.60 cm   Diastology LVIDs:         3.10 cm   LV e' medial:    5.33 cm/s LV PW:         1.00 cm   LV E/e' medial:  11.4 LV IVS:        1.00 cm   LV e' lateral:   8.27 cm/s LVOT diam:     2.10 cm   LV E/e' lateral: 7.4 LV SV:         55 LV SV Index:   28 LVOT Area:     3.46 cm  RIGHT VENTRICLE RV S prime:     15.40 cm/s TAPSE (M-mode): 2.2 cm LEFT ATRIUM            Index        RIGHT ATRIUM           Index LA diam:      3.00 cm 1.52 cm/m   RA Area:     14.40 cm LA Vol (  A2C): 25.5 ml 12.95 ml/m  RA Volume:   37.90 ml  19.24 ml/m LA Vol (A4C): 39.5 ml 20.05 ml/m  AORTIC VALVE LVOT Vmax:   84.60 cm/s LVOT Vmean:  55.000 cm/s LVOT VTI:    0.160 m  AORTA Ao Root diam: 3.80 cm MITRAL VALVE MV Area (PHT): 4.79 cm    SHUNTS MV Decel Time: 159 msec    Systemic VTI:  0.16 m MV E velocity: 60.90 cm/s  Systemic Diam: 2.10 cm MV A velocity: 63.65 cm/s MV E/A ratio:  0.96 Candee Furbish MD Electronically signed by Candee Furbish MD Signature Date/Time: 01/08/2022/4:06:15 PM    Final    DG Lumbar Spine Complete  Result Date: 12/17/2021 CLINICAL DATA:  Fall.  Right-sided low back pain. EXAM: LUMBAR SPINE - COMPLETE 4+ VIEW COMPARISON:  Lumbar spine radiographs 08/23/2021 FINDINGS: There are 5 non rib-bearing lumbar vertebrae. Sequelae of T10-L5 posterior fusion and L1-L5 interbody fusion are again identified. Vertebral alignment is unchanged, and there is no significant listhesis. A chronic L1 compression fracture is unchanged. No acute fracture is identified. Right upper quadrant abdominal surgical clips are noted. IMPRESSION: No acute osseous abnormality identified. Extensive thoracolumbar fusion. Electronically Signed   By: Logan Bores M.D.   On: 12/17/2021 14:44         Latest Ref Rng & Units 10/20/2021    4:09 PM 02/09/2021    1:43 PM 07/06/2020    8:47 AM 12/30/2019   10:15 AM 07/15/2019    8:59 AM 01/10/2018   11:01 AM 10/26/2016    9:28 AM  PFT Results  FVC-Pre L 2.64  3.61  3.50  3.56  3.58  3.52  4.21   FVC-Predicted Pre % 66  90  86  88  88  82  96   FVC-Post L   3.46   3.60  3.53  4.08   FVC-Predicted Post %   85   88  83  93   Pre FEV1/FVC % % 88  83  81  82  76  81  78   Post FEV1/FCV % %   84   79  82  81   FEV1-Pre L 2.31  2.98  2.82  2.93  2.72  2.85  3.30   FEV1-Predicted Pre % 80  103  96  99  91  91  102   FEV1-Post L   2.89   2.85  2.88  3.30   DLCO  uncorrected ml/min/mmHg 14.53  20.68  19.11  20.72  19.51  21.15  26.71   DLCO UNC% % 61  86  79  86  80  68  84   DLCO corrected ml/min/mmHg 14.53  22.11  19.11  20.55  19.51  20.44  25.62   DLCO COR %Predicted % 61  92  79  85  80  65  80   DLVA Predicted % 82  100  90  92  90  81  87   TLC L   5.29   5.86  7.25  6.79   TLC % Predicted %   79   88  106  98   RV % Predicted %   71   94  155  102     Lab Results  Component Value Date   NITRICOXIDE 8 03/03/2018        Assessment & Plan:   ILD (interstitial lung disease) (HCC) ILD progressive phenotype (unclassifiable ILD)- Recent high-resolution CT chest shows  no significant change.  Stable fibrosis.  2D echo shows no significant pulmonary hypertension.  Preserved EF.  Patient has PFTs pending for next month.  Clinically appears to be about the same.  Unfortunately has been unable to tolerate Ofev or Esbriet.  Plan  Patient Instructions  Activity as tolerated  Follow up with Randall. Chase Caller with PFT as planned next month and As needed          Rexene Edison, NP 01/11/2022

## 2022-02-01 ENCOUNTER — Ambulatory Visit (INDEPENDENT_AMBULATORY_CARE_PROVIDER_SITE_OTHER): Payer: PPO | Admitting: Internal Medicine

## 2022-02-01 ENCOUNTER — Encounter: Payer: Self-pay | Admitting: Internal Medicine

## 2022-02-01 VITALS — BP 122/70 | HR 68 | Temp 98.2°F | Ht 68.0 in | Wt 187.2 lb

## 2022-02-01 DIAGNOSIS — R0602 Shortness of breath: Secondary | ICD-10-CM

## 2022-02-01 DIAGNOSIS — J849 Interstitial pulmonary disease, unspecified: Secondary | ICD-10-CM

## 2022-02-01 DIAGNOSIS — Z23 Encounter for immunization: Secondary | ICD-10-CM | POA: Diagnosis not present

## 2022-02-01 DIAGNOSIS — Z7185 Encounter for immunization safety counseling: Secondary | ICD-10-CM

## 2022-02-01 LAB — PULMONARY FUNCTION TEST
DL/VA % pred: 81 %
DL/VA: 3.26 ml/min/mmHg/L
DLCO cor % pred: 61 %
DLCO cor: 14.7 ml/min/mmHg
DLCO unc % pred: 61 %
DLCO unc: 14.7 ml/min/mmHg
FEF 25-75 Pre: 2.81 L/sec
FEF2575-%Pred-Pre: 134 %
FEV1-%Pred-Pre: 91 %
FEV1-Pre: 2.62 L
FEV1FVC-%Pred-Pre: 113 %
FEV6-%Pred-Pre: 86 %
FEV6-Pre: 3.18 L
FEV6FVC-%Pred-Pre: 106 %
FVC-%Pred-Pre: 80 %
FVC-Pre: 3.18 L
Pre FEV1/FVC ratio: 82 %
Pre FEV6/FVC Ratio: 100 %

## 2022-02-01 NOTE — Progress Notes (Signed)
Synopsis: Referred in November 2017 for evaluation of shortness of breath.  Found to have diffuse parenchymal lung disease of undetermined cause. An open lung biopsy performed on 02/19/2018 showed an early fibrotic process suggestive of UIP.  He only smoked cigarettes for about 3 to 6 months when he was a teenager.  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    OCt 2019   Mr. Lashomb returns to clinic today to discuss the results from his recent open lung biopsy which showed early fibrotic changes suggestive of UIP as well as emphysema.  He tells me today that he is slowly feeling better but he does still have some fatigue and shortness of breath.  He has not been exercising much lately.  He tried to go fishing yesterday and he says that any walking was quite difficult for him.  He says that he is not coughing too much right now.  He is taking prednisone still as directed by her clinic earlier this month and he is wondering what to do with that.  He is still taking a bronchodilator.  He tells me today that he only smokes cigarettes for about 3 to 6 months when he was a teenager.  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   OV March 2020 [name] has returned to clinic today to see me.  He recently was hospitalized for diverticulitis, perforated, requiring emergent surgery. He says that his wound is slowly healing. He resumed CellCept and prednisone per my recommendation. His wife says that his wound is healing fairly well.  His shortness of breath is at baseline.  No recent pneumonia or bronchitis.  He is taking his nintedanib.   PATIENT ID: Janna Arch Jakes GENDER: male DOB: 06/29/1945, MRN: [id number]  Chief Complaint  Patient presents with   Follow-up    Patient has quit taking the [facility] and has felt a lot  better since then. Patient was taking Stiolto but got thrush and took some Nystatin and it resolved it. Patient needs back surgery in about 3-4 months.      OV 04/16/2019: Mr. Kritikos is a 76 year old gentleman who presents for follow-up of NSIP.  He has had worse symptoms since interrupting his CellCept and nintedanib for his colectomy reversal.  He had bleeding when he initially restarted these medicines a few weeks postop, which required stopping them again.  He restarted his nintedanib last week and CellCept with Bactrim about 2 weeks prior.  He has not had any additional bleeding.  No abdominal pain.  He feels better than he did before surgery.  He still has mild dyspnea, not worse than his last visit.  He notices it most when he is leaning over.  No other complaints.  His PCP did his 33-monthlab work earlier this week, and he will call the office to request they send uKorearesults.   OV 03/19/2019: Mr. RGuedesis a 76year old gentleman with history of ILD to NSIP and polymyalgia rheumatica on chronic immunosuppression who presents for follow-up.  He underwent colostomy reversal on 01/26/2019, for which his CellCept plus Bactrim and nintedanib were held due to concern for potential surgical complications.  Several weeks following surgery his medications were restarted, and within a day or 2 he developed rectal bleeding.  This continued for several days until  he notified us, and we instructed him to stop.  Within the next day or 2 the bleeding has stopped.  He notices that his breathing has started to get a little bit worse over the last few weeks, and he would like to restart his medications.  He feels like he cannot exert himself due to his mild dyspnea.  He denies abdominal pain, fevers, nausea, vomiting, diarrhea, constipation, or periodic rectal bleeding.  He is following up with Dr. Marlou Starks surgery tomorrow.  He had had a colonoscopy just prior to surgery, but is unsure if he had hemorrhoids.  He  follows with [name] in rheumatology.  His prednisone has been tapered down to 7 mg, which will be his dose for now.  There has been mention of possibly starting Plaquenil in the future for his polymyalgia rheumatica.  OV 01/21/19: Mr. Lariccia is a 76 year old gentleman with a history of fibrosing NSIP diagnosed on open lung biopsy in 2019 who presents for routine follow-up and preoperative evaluation before colostomy reversal next week.  He has a history of polymyalgia rheumatica for which she has been on chronic steroids.  He is followed by [name] from rheumatology.  Currently he is being titrated off his chronic prednisone, currently on 8 mg daily.  For his NSIP he is maintained on CellCept, nintedanib, and Bactrim 3 days a week.  He is doing well, better than last year from breathlessness standpoint.  He went hiking with his wife a few weeks ago, which he reports he would have been unable to do a year ago.  He has nausea and diarrhea associated with nintedanib, but he is able to tolerate these with the use of Zofran and Imodium.  No jaundice, itching, right upper quadrant pain.  In February 2020 he underwent emergency partial colectomy with diverting colostomy for perforated diverticulitis.  He is planning to undergo colostomy reversal next week.  In July he fell, causing an L1 compression fracture.  [name] is planning to do a bone mineral density study, and currently he is not on calcium and vitamin D supplements.  In a few months he is planning on having a small outpatient back surgery to address a bone spur that is causing radicular pain in his left leg.  Today he wants to discuss plans for his pulmonary meds perioperatively.  He has previously had symptomatic renal insufficiency when he tried to come off prednisone on his own too quickly.  During his VATS and colectomy he received 10 mg of dexamethasone in the OR, and he received low-dose Solu-Medrol postoperatively from his  colectomy.     OV 07/21/19: Mr. Primeau is a 76 year old gentleman with a history of ILD- UIP vs NSIP on chronic Ofev, mycophenolate who presents for follow-up.  He required disruption of these medications for several months in late 2020 his colostomy reversal.  When the meds were restarted postoperatively he had persistent bleeding, leading to prolonged time off medications and restarting them sequentially.  He feels that his breathing has gotten worse over time since then, but is unsure if that was all due to being off the meds or if it is continued to decline since then.  His breathing is usually worse during warmer weather.  He has noticed that his activities are more limited than they used to be.  He recently had an outpatient back surgery where he had to have bone fragments removed that were pinching and nerves.  He was developing left lower extremity weakness preoperatively.  Due to ongoing nerve impingement that is presumed to be due to inflammation, he is currently on a prednisone taper.  Currently on 20 mg daily and will decrease to 15 mg daily tomorrow.  He is on chronic prednisone for polymyalgia rheumatica at 7 mg daily.  He has been on Ofev 100 mg twice daily.  He was initially started on 150 mg twice daily, but required decrease in his dose due to nausea, vomiting, diarrhea.  Over time the symptoms have improved.  He restarted his meds postoperatively in the fall 2020 he had symptoms short-term, but noticed improvement over time.  When he had GI symptoms at the higher dose, he had some improvement with Zofran and Phenergan.  He is currently not having blood in his stools or other issues related to surgery.    He and his wife are planning a cross-country trip with their RV to visit children and grandchildren later this spring where they will be gone for 2 months.  Reviewed rheumatology, Dr. Melissa Noon clinic note 06/15/2019.  Considering starting Plaquenil to reduce dose of prednisone.   OV  4?!5/21  Mr. Santucci is a 76 year old gentleman with a history of IPF who presents for follow-up.  He is accompanied by his wife today.  He recently had a severe shortness of breath episode that was precipitated by being around a significant amount of sawdust.  He did well with Stiolto, but developed thrush, which has been an issue for him with inhalers in the past.  He is improved with nystatin.  He stopped his Ofev due to severe intractable nausea and vomiting.  He now feels much better and is back to normal.  His shortness of breath is persistent-he has trouble walking around the house.  He has continued on his Bactrim and CellCept plus prednisone for polymyalgia rheumatica.  He wants to know if he can stop his Eliquis to be able to take an NSAID for help with his joint pain.  He has new leg edema bilaterally, but no edema elsewhere.  No chest pain.       OV 10/22/2019  Subjective:  Patient ID: Bjorn Pippin, male , DOB: [birth year] , age 41 y.o. , MRN: [id number] , ADDRESS: [place] [address]   PCP [name], MD Rheum - Dr Jannette Fogo 0Dr McQuaid -> Dr Ernest Mallick -> Dr Chase Caller  10/22/2019 -   Chief Complaint  Patient presents with   Follow-up    Pt being referred to Dr. Chase Caller by Dr. Carlis Abbott for ILD.  Pt states he has been doing good since last visit and states that he feels SOB is stable. Denies any complaints of cough or chest discomfort.  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  - in 2019 : considered progressive pre-bx (on PFT/CT per notes)  - in 2019 - clinical CTD ruled out by Dr. Esmeralda Arthur record review_  -High-resolution CT March 2021: Indeterminate for UIP without change since 2017  RX  -  Mid-endJan 2020 -s tarted cellcept/bactrim + ofev (course complicated early February 2020 by diverticulitis and intermittent side effects with stop start of the nintedanib)  Pathological tissue mild emphysema present on surgical lung biopsy October 2019 [not evident on pulmonary function testing]  - Alpha 1 - MM:  Oct 2019  -On inhaler therapy  -No obstruction seen on PFT or emphysema on CT   History of perforated sigmoid diverticulitis early February 2021 few weeks after starting nintedanib/CellCept  -Status post reversal of colostomy in September 2020   HPI Pranit Owensby Rodarte 76 y.o. -presents to the ILD center with the above history.  Is a transfer of care to the ILD center.  Recently his case was discussed in June 2021 with interstitial lung disease multidisciplinary conference.  His pathology though showed fibroblastic foci did have then reformed branching fibrosis.  There was ossification.  There is no honeycombing.  There was heterogeneity and fibroblastic foci suggestive of UIP.  Overall it was felt that he had unclassifiable ILD.  The radiologist felt his CT scan had not progressed between 2018 and 2021.  His pulmonary function test were variable.  Therefore it was recommended that he be seen at the ILD center.  His ILD diagnosis as above.  He tells me though that when he started the nintedanib he started feeling better.  Although at the same time or before that he was also committed to inhaler therapy based on pathological emphysema on his lung biopsy from 2019.  This is also helped.  Therefore he is a concomitant diagnosis COPD/emphysema.  He is currently here to discuss his future care options.  He tells me  that he has continued CellCept and Bactrim for his ILD since early 2020 except for interruptions due to surgery.  He has been off nintedanib but recently has rechallenged himself at 150 mg once daily for the last 1 month and he is tolerating it fine.  His rheumatologist is working down on his prednisone to 5 mg/day [this is for polymyalgia rheumatica].  He tells me he has upcoming spinal surgery to the C-spine and later to the L-spine.  He wants clearance for this.  Noted he is on anticoagulation with Eliquis and has had bleeding episode.  [place] Integrated Comprehensive ILD Questionnaire  Symptoms:    Overall he is reporting progressive dyspnea although the conference it was felt radiology did not change over time.  Is been going on insidious onset for the last 3 years.  Dyspnea present on exertion relieved by rest.     Past Medical History : As above   ROS: Positive for fatigue and diffuse arthralgia.  No dysphagia.  No dry eyes.  No Raynaud's no weight loss.  No vomiting.  No rash   FAMILY HISTORY of LUNG DISEASE:  -Father had asthma.  No COPD no sarcoid no cystic fibrosis no hypersensitive pneumonitis no autoimmune disease.  EXPOSURE HISTORY: Briefly smoked cigarettes for 3 years.  Otherwise never smoked  Cigarettes.  He did smoke marijuana very little in 1972 and never use cocaine.  No intravenous drug use     HOME and HOBBY DETAILS : [place].  Age of the home is 79 years.  He has lived in the home for 30 years.  No dampness no mildew no mold no humidifier use no CPAP use no nebulizer use.  No steam iron use.  No Jacuzzi use.  There is a misting Fountain in the house because his wife likes it.  But there is no mold or mildew.  No pet gerbils.  No feather pillows.  No mold in the [facility] duct.  No music habits no guarding habits.  No flood of water damage.  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  In the past he is done woodwork, Brewing technologist work, Biomedical engineer.  He has been exposed to asbestos woodwork and furniture work.  During furniture work he got exposed to meth saline chloride.  He also is a [place] (27 items): CellCept, prednisone and nintedanib       ROS - per HPI  Results for MALIKIAH, DEBARR SILER "SI" (MRN [id number]) as of 10/22/2019 16:07  Ref. Range 05/29/2016 10:05 10/10/2016 13:54 01/14/2018 09:50 01/14/2018 09:50 03/12/2018 11:24 6/10  Aspergillus flavus Unknown REPORT       Aspergillus Fumigatus Latest Ref Range: NEGATIVE     NEGATIVE    A fumigatus #1 Unknown REPORT       Aspergillus [place] Unknown REPORT       [place]on Serum Latest Ref Range: NEGATIVE  REPORT   NEGATIVE    Apullulans Unknown REPORT       A-1 Antitrypsin, Ser Latest Ref Range: 83 - 199 mg/dL     125   Anti Nuclear Antibody (ANA) Latest Ref Range: NEGATIVE  NEG   NEGATIVE    CENTROMERE AB SCREEN Latest Ref Range: <1.0 NEG AI <1.0 NEG  <8.2 NEG     Cyclic Citrullin Peptide Ab Latest Units: UNITS <16   <16  < 16  RA Latex Turbid. Latest Ref Range: <14 IU/mL 24 (H)   38 (H)  32  B burgdorferi Ab IgG+IgM Latest Ref Range: <0.90 Index  <0.90      SSA (Ro) (ENA) Antibody, IgG Latest Ref Range: <1.0 NEG AI <1.0 NEG   <1.0 NEG    SSB (La) (ENA) Antibody, IgG Latest Ref Range: <1.0 NEG AI <1.0 NEG   <1.0 NEG    Scleroderma (Scl-70) (ENA) Antibody, IgG Latest Ref Range: <1.0 NEG AI <1.0 NEG  <1.0 NEG <1.0 NEG      IMPRESSION: COMPARISON:  CT chest, 12/06/2017, 06/13/2016, 04/02/2016   FINDINGS: Cardiovascular: Aortic atherosclerosis. Normal heart size. No pericardial effusion.   Mediastinum/Nodes: No enlarged mediastinal, hilar, or axillary lymph nodes. Thyroid gland, trachea, and esophagus demonstrate no significant findings.   Lungs/Pleura: Wedge resections of the right  lung. Redemonstrated mild pulmonary fibrosis with a slight apical to basal gradient featuring tubular bronchiectasis in the lower lobes, peripheral irregular interstitial pulmonary opacity, and no evidence of bronchiolectasis or honeycombing. Fibrotic findings are not significantly changed compared to prior examinations. No significant air trapping on expiratory phase examination. No pleural effusion or pneumothorax.  1. No significant change in mild pulmonary fibrosis with a slight apical to basal gradient featuring tubular bronchiectasis in the lower lobes, peripheral irregular interstitial pulmonary opacity, and no evidence of bronchiolectasis or honeycombing. No significant air trapping on expiratory phase examination. Findings remain consistent with "indeterminate for UIP" pattern fibrosis by ATS pulmonary fibrosis criteria. Findings are indeterminate for UIP per consensus guidelines: Diagnosis of Idiopathic Pulmonary Fibrosis: An Official ATS/ERS/JRS/ALAT Clinical Practice Guideline. [place], Iss 5, [phone], Jan 12 2017.   2.  Interval wedge biopsies of the right lung.  3.  Aortic Atherosclerosis (ICD10-I70.0).     Electronically Signed   By: Eddie Candle M.D.   On: 07/30/2019 16:33     OV 12/30/2019  Subjective:  Patient ID: Bjorn Pippin, male , DOB: 06-10-1945 , age 89 y.o. , MRN: [id number] , ADDRESS: [address] [address]   12/30/2019 -   Chief Complaint  Patient presents with   Follow-up    reports feeling "about the same" since last visit. Last albuterol use was this past weekend   Follow-up unclassifiable interstitial lung disease with trace positive rheumatoid factor  HPI Braelynn Lupton Prieto 76 y.o. -presents for follow-up. Since his last visit we recheck his autoimmune panel. His rheumatoid factor is only trace positive. He had pulmonary function test today and is documented below the stable. His spring  2021 CT chest is stable. Overall stability for few years. He again tells me that he is much better than he was 18 months ago in terms of shortness of breath. Symptom scores document that. At this point in time he is off CellCept. He thinks his improvement is related to starting nintedanib. His wife is here with him and I am meeting her for the first time. She did acknowledge that this potential that he is feeling better because back then when he had the PVCs of ILD diagnosed he was not in a good frame of mind. Because he was dealing with back and neck issues. This is because his pre and post nintedanib PFT shows stability. Since his last visit he is off CellCept. He has had a C-spine surgery. His back surgery has been postponed. However he is dealing with significant diarrhea. The diarrhea is despite stopping CellCept. This is because of nintedanib. He currently has dropped his nintedanib to 100 mg once daily but despite that is still having diarrhea. He does take some coffee in the morning with sugar but despite that has diarrhea. He prefers that he stop the nintedanib but in the past he acknowledges that it helped him.      OV 07/06/2020  Subjective:  Patient ID: Bjorn Pippin, male , DOB: 1946/05/14 , age 34 y.o. , MRN: [id number] , ADDRESS: [place] [address] PCP [name], MD Patient Care Team: [name], MD as PCP - General (Internal Medicine) Troy Sine, MD as PCP - Cardiology (Cardiology)  This Provider for this visit: Treatment Team:  Attending Provider: Brand Males, MD    07/06/2020 -   Chief Complaint  Patient presents with   Follow-up    PFT performed today.  Pt states he feels like he might have declined some since last visit. Pt states his breathing is some worse. Denies any complaints of cough.      HPI Javonta Gronau Brunke 76 y.o. -returns for follow-up for his ILD.  He presents with his wife.  He is on supportive care.  He quit  taking his CellCept and nintedanib in the summer 2021 following diarrhea in the setting of GI surgery and colostomy reversal.  He does not want to take these drugs anymore.  However he tells me that he is feeling more short of breath in the last 4 months.  Present on exertion relieved by rest.  His symptom score reflect worsening shortness of breath.  He continues to have some amount of diarrhea because of reversal of his colostomy.  He had a high-resolution CT chest that shows stability in ILD per the  radiologist since 2017.  His walking desaturation test is stable.  However his pulmonary function test shows confusing signal compared to 6 months or 1 year ago.  Is stable versus normal variation versus slight decline.  I personally visualized the images of the pulmonary function test and the grph with him.  He is open to getting his pulmonary pathology slides sent for third opinion at an outside institution.  We discussed antifibrotic's in case his fibrosis is getting worse.  He is definitely not interested in the other option of pirfenidone after hearing side effects of nausea, vomiting, weight loss, fatigue and possible diarrhea.  He is interested in drugs that may not have GI side effects.  These are available in clinical trials.  For which she will need an IPF diagnosis.  Therefore is interested in getting another opinion on the slides to see if you would qualify for trials in the future.  His wife does indicate that he did have an episode of chest pain many went outdoors a few months ago and was climbing a hill.  In fact the chest pain was so bad it was central that he had to come back.  They have not reported this to the cardiologist.  He has cardiology follow-up coming up with Dr. Claiborne Billings in May 2022.  He had normal stress test or low risk in 2017.  His echo was normal in May 2021.       IMPRESSION: HRCT  Feb 2022  COMPARISON:  07/30/2019, 04/02/2016   FINDINGS: Cardiovascular: Aortic  atherosclerosis. Normal heart size. No pericardial effusion.   Mediastinum/Nodes: No enlarged mediastinal, hilar, or axillary lymph nodes. Thyroid gland, trachea, and esophagus demonstrate no significant findings.   Lungs/Pleura: Evidence of prior right lung wedge resections. No significant interval change in pattern of mild pulmonary fibrosis featuring irregular peripheral interstitial opacity and mild, tubular bronchiectasis at the lung basis without evidence of significant subpleural bronchiolectasis or honeycombing. No significant air trapping on expiratory phase imaging. No pleural effusion or pneumothorax.    1. No significant interval change in pattern of mild pulmonary fibrosis featuring irregular peripheral interstitial opacity and mild, tubular bronchiectasis at the lung basis without evidence of significant subpleural bronchiolectasis or honeycombing. Findings remain consistent with an "indeterminate for UIP" pattern of fibrosis by pulmonary fibrosis criteria. Findings are indeterminate for UIP per consensus guidelines: Diagnosis of Idiopathic Pulmonary Fibrosis: An Official ATS/ERS/JRS/ALAT Clinical Practice Guideline. [place], Iss 5, [phone], Jan 12 2017. 2. Evidence of prior right lung wedge resections.   Aortic Atherosclerosis (ICD10-I70.0).    According to the radiologist the pulmonary fibrosis is stable between 2017 through February 2022 on CT scan of the chest   Plan -We discussed about starting the other antifibrotic pirfenidone but given your history of GI side effects with nintedanib I respect your desire to refuse but continue with expectant follow-up  -We will send a message to Dr. Shelva Majestic to evaluate you for angina  -Once Dr. Claiborne Billings clears you I think you should join pulmonary rehabilitation  -I have sent a note to our pathologist to get another opinion from another institution on your lung pathology to see if you have  undifferentiated connective tissue disease or UIP/IPF.  If you have UIP/IPF you might qualify for clinical trials with drugs that do not have GI side effects  Follow-up -Do spirometry and DLCO in 3 months =     OV 02/09/2021  Subjective:  Patient ID: Janna Arch Sabic,  male , DOB: Jul 12, 1945 , age 70 y.o. , MRN: [id number] , ADDRESS: [place] [id number] PCP [name], MD Patient Care Team: [name], MD as PCP - General (Internal Medicine) Troy Sine, MD as PCP - Cardiology (Cardiology)  This Provider for this visit: Treatment Team:  Attending Provider: Brand Males, MD    02/09/2021 -   Chief Complaint  Patient presents with   Follow-up    PFT performed today.  Pt states he recently had back surgery 12/22/20. States his breathing has been about the same.   2  HPI Enoc Getter Marbach 76 y.o. -follow-up unclassifiable ILD.  Last seen first in February 2022.  After that in July 2022 he saw a nurse practitioner for a preop clearance.  Since then he has had back fusion surgery.  He is slowly recovering from that.  He is here with his wife [name].  Dyspnea score is stable.  He is not interested in antifibrotic's given previous side effects.  Last visit in February 2022 I contacted pathology to get another opinion from the [place] clinic.  This is because the 2019 pathology revealed grade at [facility] said unclassifiable ILD but also indicated that there might be UIP.  I was not sure.  Still waiting on that interpretation in clinic.  I followed up with pathology about it nevertheless he is doing stable.  He will have a high-dose flu shot today.  He has never had COVID and wanted to know if he should get the COVID by Valent mRNA booster.  I replied in the affirmative.  Currently is dealing a lot with back issues and slowly getting more mobile.  He and his wife wanting to travel to [place] to visit family.  They asked about travel advice in the era of  COVID.     OV 10/31/2021  Subjective:  Patient ID: Bjorn Pippin, male , DOB: [birth year] , age 49 y.o. , MRN: [id number] , ADDRESS: [address] [place] [id number] PCP [name], MD Patient Care Team: [name], MD as PCP - General (Internal Medicine) Troy Sine, MD as PCP - Cardiology (Cardiology)  This Provider for this visit: Treatment Team:  Attending Provider: Brand Males, MD     10/31/2021 -   Chief Complaint  Patient presents with   Follow-up    Pt states that his breathing has become worse since last visit. Denies any complaints of cough.     HPI Derrion Tritz Janczak 76 y.o. -returns for follow-up.  Last seen in September 2022.  He states our office never called back to make his 67-monthfollow-up.  He then had to call because he is now having insidious onset of worsening shortness of breath.  In December 2022 after his organ trip he picked up a respiratory viral infection with persistent cough.  Since then he has been more short of breath.  His symptom score is only slightly worse.  His walking desaturation test is stable but his pulmonary function test shows a significant decline in FVC and DLCO.  Last CT scan of the chest was a year ago.  Last echo was a year ago.  He is worried about his decline.  In the past he has been intolerant to nintedanib [facility].  Explained to him IPF versus non-IPF.  Explained to him progressive phenotype and by unclassifiable ILD if it sent.   01/11/2022 Follow  up : ILD  Patient returns 76-monthfollow-up.  Patient has underlying interstitial lung disease.  Last visit patient was started on Esbriet.  Patient says he tried 3 separate occasions to take Esbriet but was unable to tolerate due to severe GI symptoms and diarrhea.  He was set up for high-resolution CT chest that was done on January 09, 2022 that showed no significant interval change in his mild  pulmonary fibrosis with a pattern apical to basilar gradient. 2D echo showed preserved EF, grade 1 diastolic function, right ventricular systolic function normal.  RV size is normal.  Labs were unrevealing.  BNP was normal. Patient says overall he is doing okay.  He tries to stay active with light chores around the house.  Patient denies any flare of cough or wheezing.  Has had no decrease in activity tolerance. Patient has PFTs planned for next month. Patient has tried Ofev in the past and was unable to tolerate due GI side effects. Patient says since last visit he is not worse or better he is about the same.  He gets winded with heavy activities.    OV 02/01/2022  Subjective:  Patient ID: VBjorn Pippin male , DOB: [birth year], age 76y.o. 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   - May clinic 3rd opinion 2022:  it is not IPF but they along with [facility] -place it as unclassfiable ILD.  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  - in 2019 : considered progressive pre-bx (on PFT/CT per notes)  - in 2019 - clinical CTD ruled out by Dr. 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 Pt states he is still having problems with SOB.     HPI VaTreyvion  Siler Quinlivan 76 y.o. -returns for follow-up.  Has gained a few more pounds of weight.  He did try pirfenidone but this caused him nausea and diarrhea and then in August 2020 he had COVID he possibly pirfenidone.  Once he recovered from [place] he tried it again it made him sick again to his stomach.  Therefore he stopped it.  He has no interest in doing antifibrotic's again.  He had repeat pulmonary function test and this shows that the pulmonary function test is slightly better but overall 2023 the pulmonary function test is worse compared to 2022 and before.  Nevertheless symptom wise he is stable.  We did do a high-res CT and shows continued stability of the ILD for many years.  Therefore he might not have progressive phenotype.  Even if he does at this point in time he is not tolerating any of the approved antifibrotic's.  He is tolerating a weeklong trip to [facility]  to be with his daughter and son-in-law and grandchild and teach the grandchild fishing.  We discussed the ILD-Pro registry and if he qualifies he said he would be interested in it but his symptom scores remained stable and the CT scan has been stable.  Therefore he might not qualify.  He is willing to have the high-dose flu shot today.  He does not want have the COVID-vaccine because he believes he has natural immunity.  We did discuss RSV vaccine.      SYMPTOM SCALE - ILD 10/22/2019  12/30/2019  07/06/2020  02/09/2021 Supporitive care. S/p back surgery. 183# 10/31/2021  -> start esbriet 02/01/2022 -> off esbriet due to side effect  O2 use ra ra ra ra a ra  Shortness of Breath 0 -> 5 scale with 5 being worst (score 6 If unable to do)       At rest 1 1 0 _0 Simple tasks - showers, clothes change, eating, shaving _1 Household (dishes, doing bed, laundry) _2 Shopping _3 Walking level at own pace _4 Walking up Stairs _5 Total (30-36) Dyspnea Score _6 How bad is your cough? 0 0 0 0 0 0  How bad is your fatigue yes _7 How bad is nausea yes 3 ofev _8 How bad is vomiting?  n 0 0 0 0 0  How bad is diarrhea? no 3 ofev 1 - baseline from GI surgery 0 00 2  How bad is anxiety? x 1 0 1 0 1  How bad is depression x 1 0 1 0 1     Simple office walk 185 feet x  3 laps goal with forehead probe 10/22/2019  07/06/2020  10/31/2021  02/01/2022   O2 used ra ra ra ra  Number laps completed _9 Comments about pace avg avg    Resting Pulse Ox/HR 98% and 90/min 98% and HR 78 99% and HR 91 98% and HR 68  Final Pulse Ox/HR 96% and 107/min 97% and HR 95 96% and HR 108 97% and HR 84  Desaturated </= 88% no  nni   Desaturated <= 3% points no  no   Got Tachycardic >/= 90/min yes  Yes, 3 points No 1 point  Symptoms at end of  test Mild dyspnea Mild dy spnea Mild dyspnea Mild duyspnea  Miscellaneous comments x        PFT     Latest Ref Rng & Units 02/01/2022    8:57 AM 10/20/2021    4:09 PM 02/09/2021    1:43 PM 07/06/2020    8:47 AM 12/30/2019   10:15 AM 07/15/2019    8:59 AM 01/10/2018   11:01 AM  PFT Results  FVC-Pre L 3.18  P 2.64  3.61  3.50  3.56  3.58  3.52   FVC-Predicted Pre % 80  P 66  90  86  88  88  82   FVC-Post L    3.46   3.60  3.53   FVC-Predicted Post %    85   88  83   Pre FEV1/FVC % % 82  P 88  83  81  82  76  81   Post FEV1/FCV % %    84   79  82   FEV1-Pre L 2.62  P 2.31  2.98  2.82  2.93  2.72  2.85   FEV1-Predicted Pre % 91  P 80  103  96  99  91  91   FEV1-Post L    2.89   2.85  2.88   DLCO uncorrected ml/min/mmHg 14.70  P 14.53  20.68  19.11  20.72  19.51  21.15   DLCO UNC% % 61  P 61  86  79  86  80  68   DLCO corrected ml/min/mmHg 14.70  P 14.53  22.11  19.11  20.55  19.51  20.44   DLCO COR %Predicted % 61  P 61  92  79  85  80  65   DLVA Predicted % 81  P 82  100  90  92  90  81   TLC L    5.29   5.86  7.25   TLC % Predicted %    79   88  106   RV % Predicted %    71   94  155     P Preliminary result       has a past medical history of Allergy to alpha-gal, Arthritis, Chest pain, Complication of anesthesia, Difficult intubation (02/19/2018), Digestive problems, Dyspnea, Dysrhythmia, GERD (gastroesophageal reflux disease), H/O viral myocarditis, Head injury, closed, with concussion, Hyperlipidemia, Mild mitral valve prolapse, PAF (paroxysmal atrial fibrillation) (HCC), Polymyalgia rheumatica (HCC), Pre-diabetes, Pulmonary fibrosis ([place]), and Sleep apnea.   reports that he has never smoked. He has never used smokeless tobacco.  Past Surgical History:  Procedure Laterality Date   ANTERIOR CERVICAL DECOMP/DISCECTOMY FUSION N/A 11/10/2019   Procedure: Cervical Three-Four Anterior cervical decompression/discectomy/fusion;  Surgeon: [name], MD;  Location: [facility];  Service: Neurosurgery;  Laterality: N/A;  Cervical Three-Four Anterior cervical  decompression/discectomy/fusion   apendectomy  1965   APPENDECTOMY     APPLICATION OF ROBOTIC ASSISTANCE FOR SPINAL PROCEDURE N/A 12/22/2020   Procedure: APPLICATION OF ROBOTIC ASSISTANCE FOR SPINAL PROCEDURE;  Surgeon: [name], MD;  Location: [place];  Service: Neurosurgery;  Laterality: N/A;   BACK SURGERY     bone spur Bilateral 1999   feet   CARDIAC CATHETERIZATION  2001   CHOLECYSTECTOMY  2004   COLON RESECTION N/A 06/15/2018   Procedure: SIGMOID COLON RESECTION;  Surgeon: [name], MD;  Location: [facility];  Service: General;  Laterality: N/A;   COLONOSCOPY     COLOSTOMY N/A 06/15/2018  Procedure: COLOSTOMY;  Surgeon: [name], MD;  Location: [facility];  Service: General;  Laterality: N/A;   COLOSTOMY TAKEDOWN N/A 01/26/2019   Procedure: LAPAROSCOPIC ASSISTED COLOSTOMY TAKEDOWN;  Surgeon: [name], MD;  Location: [place];  Service: General;  Laterality: N/A;   EYE SURGERY Bilateral    cataract removal   LAPAROSCOPIC LYSIS OF ADHESIONS N/A 01/26/2019   Procedure: LAPAROSCOPIC LYSIS OF ADHESIONS;  Surgeon: [name], MD;  Location: [facility];  Service: General;  Laterality: N/A;   LAPAROTOMY N/A 06/15/2018   Procedure: EXPLORATORY LAPAROTOMY;  Surgeon: [name], MD;  Location: [facility];  Service: General;  Laterality: N/A;   LUMBAR LAMINECTOMY/ DECOMPRESSION WITH MET-RX Right 05/05/2013   Procedure: Right Lumbar three-four Extraforaminal Microdiskectomy with Metrex;  Surgeon: [name], MD;  [facility];  Service: Neurosurgery;  Laterality: Right;  Right Lumbar three-four Extraforaminal Microdiskectomy with Metrex   LUNG BIOPSY Right 02/19/2018   Procedure: LUNG BIOPSY;  Surgeon: [name], MD;  Location: [place];  Service: Thoracic;  Laterality: Right;   POSTERIOR LUMBAR FUSION 4 LEVEL N/A 12/22/2020   Procedure: Lumbar one-two Posterior lumbar interbody fusion with stabilization from Thoracic ten to Lumbar one with robotic screw placement;  Surgeon:  [name], MD;  Location: [facility] [place];  Service: Neurosurgery;  Laterality: N/A;  RM 20   SHOULDER OPEN ROTATOR CUFF REPAIR Bilateral 2001   TONSILLECTOMY     VIDEO ASSISTED THORACOSCOPY Right 02/19/2018   Procedure: VIDEO ASSISTED THORACOSCOPY;  Surgeon: [name], MD;  Location: [place];  Service: Thoracic;  Laterality: Right;    Allergies  Allergen Reactions   Xylocaine [Lidocaine] Anaphylaxis    Injection form   Codeine Nausea And Vomiting   Statins Other (See Comments)    Muscle soreness and an aching feeling all over Myalgias   Aldactone [Spironolactone]     Sore nipples and chest   Alpha-Gal     2015; has been able to re-introduce red meat for the past 2 1/2 years without reaction (as of 01/21/19)   Pirfenidone Other (See Comments)    GI side effects   Percocet [Oxycodone-Acetaminophen] Itching    Immunization History  Administered Date(s) Administered   Fluad Quad(high Dose 65+) 02/09/2021   Influenza Split 03/20/2016   Influenza, High Dose Seasonal PF 01/29/2018, 02/25/2019, 03/20/2019, 12/13/2019   Influenza,inj,Quad PF,6+ Mos 03/12/2017   Moderna Sars-Covid-2 Vaccination 06/04/2019, 07/10/2019, 03/18/2020   Pneumococcal Conjugate-13 01/10/2018    Family History  Problem Relation Age of Onset   Rheum arthritis Mother    Thyroid cancer Mother    Heart disease Father    Asthma Father    Thyroid cancer Sister    Melanoma Brother      Current Outpatient Medications:    acetaminophen (TYLENOL 8 HOUR) 650 MG CR tablet, Take 1 tablet (650 mg total) by mouth every 8 (eight) hours as needed for pain., Disp: 30 tablet, Rfl: 0   budesonide-formoterol (SYMBICORT) 80-4.5 MCG/ACT inhaler, Inhale 2 puffs into the lungs 2 (two) times daily as needed (shortness of breath)., Disp: , Rfl:    Capsaicin (CAPSAICIN TOPICAL PAIN PATCH) 0.025 % PTCH, Apply 1 patch topically every 12 (twelve) hours., Disp: 10 patch, Rfl: 1   cetirizine (ZYRTEC) 10 MG tablet, Take 10 mg by mouth  daily as needed for allergies. , Disp: , Rfl:    Cholecalciferol (DIALYVITE VITAMIN D 5000) 125 MCG (5000 UT) capsule, Take 5,000 Units by mouth daily., Disp: , Rfl:  diltiazem (CARDIZEM CD) 120 MG 24 hr capsule, Take 1 capsule (120 mg total) by mouth daily., Disp: 90 capsule, Rfl: 3   diphenoxylate-atropine (LOMOTIL) 2.5-0.025 MG tablet, Take 1 tablet by mouth 4 (four) times daily as needed for diarrhea or loose stools., Disp: , Rfl:    ELIQUIS 5 MG TABS tablet, TAKE ONE TABLET (5MG TOTAL) BY MOUTH TWODAILY (Patient taking differently: Take 5 mg by mouth 2 (two) times daily.), Disp: 180 tablet, Rfl: 1   ezetimibe (ZETIA) 10 MG tablet, TAKE ONE (1) TABLET BY MOUTH EVERY DAY, Disp: 90 tablet, Rfl: 3   fluticasone (FLONASE) 50 MCG/ACT nasal spray, Place 2 sprays into both nostrils daily as needed for allergies or rhinitis., Disp: , Rfl:    gabapentin (NEURONTIN) 300 MG capsule, Take 300 mg by mouth 2 (two) times daily., Disp: , Rfl:    hydroxychloroquine (PLAQUENIL) 200 MG tablet, Take 200 mg by mouth 2 (two) times daily., Disp: , Rfl:    metoprolol tartrate (LOPRESSOR) 25 MG tablet, TAKE ONE TABLET BY MOUTH TWICE A DAY (Patient taking differently: Take 25 mg by mouth 2 (two) times daily.), Disp: 180 tablet, Rfl: 3   nystatin (MYCOSTATIN) 100000 UNIT/ML suspension, Take 5 mLs (500,000 Units total) by mouth every other day., Disp: 437 mL, Rfl: 11   ondansetron (ZOFRAN) 4 MG tablet, Take 4 mg by mouth every 8 (eight) hours as needed for nausea or vomiting. , Disp: , Rfl:    predniSONE (DELTASONE) 5 MG tablet, Take 5 mg by mouth daily. , Disp: , Rfl:    rosuvastatin (CRESTOR) 10 MG tablet, TAKE ONE-HALF TABLET (5MG TOTAL) BY MOUTH ONCE A WEEK, Disp: 20 tablet, Rfl: 2   tamsulosin (FLOMAX) 0.4 MG CAPS capsule, Take 0.4 mg by mouth 2 (two) times daily. , Disp: , Rfl:    Trolamine Salicylate (ASPERCREME EX), Apply 1 application topically daily as needed (arthritis pain)., Disp: , Rfl:       Objective:    Vitals:   02/01/22 0921  BP: 122/70  Pulse: 68  Temp: 98.2 F (36.8 C)  TempSrc: Oral  SpO2: 98%  Weight: 187 lb 3.2 oz (84.9 kg)  Height: _0  (1.727 m)    Estimated body mass index is 28.46 kg/m as calculated from the following:   Height as of this encounter: _1  (1.727 m).   Weight as of this encounter: 187 lb 3.2 oz (84.9 kg).  _2 @  [employer]   02/01/22 0921  Weight: 187 lb 3.2 oz (84.9 kg)     Physical Exam   General: No distress. Looks wel. Some weight gain Neuro: Alert and Oriented x 3. GCS 15. Speech normal Psych: Pleasant Resp:  Barrel Chest - no.  Wheeze - no, Crackles - no, No overt respiratory distress CVS: Normal heart sounds. Murmurs - no Ext: Stigmata of Connective Tissue Disease - no HEENT: Normal upper airway. PEERL +. No post nasal drip        Assessment:       ICD-10-CM   1. ILD (interstitial lung disease) (HCC)  J84.9 Pulmonary function test    2. Shortness of breath  R06.02     3. Vaccine counseling  Z71.85          Plan:     Patient Instructions   ILD (interstitial lung disease) ([place]) Dyspnea on exertion   -Clinically  you have NON-IPF variety of fibrosis - Symptoms and CT are stable over the years  - PFT shows drop in 2023  over 2022 but recent one in Sept 2023 is improved; not sure if there are technical issues in our lab - Noted esbriet intolerance  Plan - mark  esbriet allergy = take consent for ILD-PRO study; you might not qualify but if you do they will contact you - spirmetry and dlco in 4-6 months  . Vaccine counseling Flu vaccine need  - high dose flu shot 02/01/2022 - no need for covid vaccine next 3-12 months due to natural immunity - RSV vaccine in 1 week or so on your own   Follow-up - -Do spirometry and DLCO in 4-6 months -Return to see Dr. Chase Caller in 4-6 months or sooner if needed - 30 min visit  - symtoms score and walk test at followup  ( Level 05 visit: Estb 40-54 min visit  type: on-site physical face to visit  in total care time and counseling or/and coordination of care by this undersigned MD - Dr Brand Males. This includes one or more of the following on this same day 02/01/2022: pre-charting, chart review, note writing, documentation discussion of test results, diagnostic or treatment recommendations, prognosis, risks and benefits of management options, instructions, education, compliance or risk-factor reduction. It excludes time spent by the [facility] or office staff in the care of the patient. Actual time 40 min)   SIGNATURE    Dr. Brand Males, M.D., F.C.C.P,  Pulmonary and Critical Care Medicine Staff Physician, [place] Director - Interstitial Lung Disease  Program  Pulmonary [place] [facility] [place], [place], [address]  Pager: [phone], If no answer or between  15:00h - 7:00h: call [phone] Telephone: [phone]  10:06 AM 02/01/2022

## 2022-02-01 NOTE — [facility] Notes (Signed)
Spirometry and Dlco done today. 

## 2022-02-01 NOTE — Patient Instructions (Addendum)
  ILD (interstitial lung disease) ([place]) Dyspnea on exertion   -Clinically  you have NON-IPF variety of fibrosis - Symptoms and CT are stable over the years  - PFT shows drop in 2023 over 2022 but recent one in Sept 2023 is improved; not sure if there are technical issues in our lab - Noted esbriet intolerance  Plan - mark  esbriet allergy = take consent for ILD-PRO study; you might not qualify but if you do they will contact you - spirmetry and dlco in 4-6 months  . Vaccine counseling Flu vaccine need  - high dose flu shot 02/01/2022 - no need for covid vaccine next 3-12 months due to natural immunity - RSV vaccine in 1 week or so on your own   Follow-up - -Do spirometry and DLCO in 4-6 months -Return to see Dr. Chase Caller in 4-6 months or sooner if needed - 30 min visit  - symtoms score and walk test at followup

## 2022-02-05 DIAGNOSIS — M5115 Intervertebral disc disorders with radiculopathy, thoracolumbar region: Secondary | ICD-10-CM | POA: Diagnosis not present

## 2022-02-05 DIAGNOSIS — M5415 Radiculopathy, thoracolumbar region: Secondary | ICD-10-CM | POA: Diagnosis not present

## 2022-02-07 DIAGNOSIS — S32010D Wedge compression fracture of first lumbar vertebra, subsequent encounter for fracture with routine healing: Secondary | ICD-10-CM | POA: Diagnosis not present

## 2022-02-07 DIAGNOSIS — R768 Other specified abnormal immunological findings in serum: Secondary | ICD-10-CM | POA: Diagnosis not present

## 2022-02-07 DIAGNOSIS — J84112 Idiopathic pulmonary fibrosis: Secondary | ICD-10-CM | POA: Diagnosis not present

## 2022-02-07 DIAGNOSIS — M353 Polymyalgia rheumatica: Secondary | ICD-10-CM | POA: Diagnosis not present

## 2022-02-07 DIAGNOSIS — E663 Overweight: Secondary | ICD-10-CM | POA: Diagnosis not present

## 2022-02-07 DIAGNOSIS — Z6827 Body mass index (BMI) 27.0-27.9, adult: Secondary | ICD-10-CM | POA: Diagnosis not present

## 2022-02-07 DIAGNOSIS — M5136 Other intervertebral disc degeneration, lumbar region: Secondary | ICD-10-CM | POA: Diagnosis not present

## 2022-02-07 DIAGNOSIS — Z7952 Long term (current) use of systemic steroids: Secondary | ICD-10-CM | POA: Diagnosis not present

## 2022-02-07 DIAGNOSIS — M8080XD Other osteoporosis with current pathological fracture, unspecified site, subsequent encounter for fracture with routine healing: Secondary | ICD-10-CM | POA: Diagnosis not present

## 2022-02-08 ENCOUNTER — Other Ambulatory Visit: Payer: Self-pay | Admitting: Internal Medicine

## 2022-02-08 DIAGNOSIS — M8080XD Other osteoporosis with current pathological fracture, unspecified site, subsequent encounter for fracture with routine healing: Secondary | ICD-10-CM

## 2022-02-23 ENCOUNTER — Ambulatory Visit
Admission: RE | Admit: 2022-02-23 | Discharge: 2022-02-23 | Disposition: A | Discharge status: Home / Self Care | Payer: PPO | Source: Ambulatory Visit | Attending: Internal Medicine | Admitting: Internal Medicine

## 2022-02-23 DIAGNOSIS — M8080XD Other osteoporosis with current pathological fracture, unspecified site, subsequent encounter for fracture with routine healing: Secondary | ICD-10-CM

## 2022-02-23 DIAGNOSIS — M8589 Other specified disorders of bone density and structure, multiple sites: Secondary | ICD-10-CM | POA: Diagnosis not present

## 2022-02-26 ENCOUNTER — Other Ambulatory Visit: Payer: Self-pay | Admitting: Cardiovascular Disease

## 2022-03-08 ENCOUNTER — Other Ambulatory Visit: Payer: Self-pay | Admitting: Pharmacist

## 2022-03-08 NOTE — Telephone Encounter (Signed)
This is Dr. Kelly's pt. °

## 2022-04-10 ENCOUNTER — Ambulatory Visit
Admission: EM | Admit: 2022-04-10 | Discharge: 2022-04-10 | Disposition: A | Discharge status: Home / Self Care | Payer: PPO | Attending: Nurse Practitioner | Admitting: Nurse Practitioner

## 2022-04-10 ENCOUNTER — Encounter: Payer: Self-pay | Admitting: Emergency Medicine

## 2022-04-10 ENCOUNTER — Other Ambulatory Visit: Payer: Self-pay

## 2022-04-10 ENCOUNTER — Ambulatory Visit (INDEPENDENT_AMBULATORY_CARE_PROVIDER_SITE_OTHER): Payer: PPO

## 2022-04-10 DIAGNOSIS — X32XXXD Exposure to sunlight, subsequent encounter: Secondary | ICD-10-CM | POA: Diagnosis not present

## 2022-04-10 DIAGNOSIS — S2232XA Fracture of one rib, left side, initial encounter for closed fracture: Secondary | ICD-10-CM

## 2022-04-10 DIAGNOSIS — M940 Chondrocostal junction syndrome [Tietze]: Secondary | ICD-10-CM

## 2022-04-10 DIAGNOSIS — L570 Actinic keratosis: Secondary | ICD-10-CM | POA: Diagnosis not present

## 2022-04-10 NOTE — Discharge Instructions (Signed)
The rib x-ray today shows a fracture on your ninth lateral rib.  I believe this is where the bruises on the front of your chest.  You can continue Tylenol as needed for pain.  You can also use a topical lidocaine patch for pain.  You can remain active, make sure you are taking plenty of deep breaths to help keep your lungs open.  Follow-up here or with primary care provider if symptoms persist or worsen despite treatment.  If you develop high fevers, cough, congestion, or pain with breathing worsens, follow-up.

## 2022-04-10 NOTE — ED Triage Notes (Signed)
Pt reports 5 days ago tripped in the house and fell on left side. Pt reports left rib/flank pain. Pt reports is on eliquis but denies loc or hitting head at time of fall.   Uses walking stick since fall to help steady gait. Pt alert and oriented.

## 2022-04-10 NOTE — ED Provider Notes (Signed)
[facility]    CSN: [id number] Arrival date & time: 04/10/22  1443      History   Chief Complaint Chief Complaint  Patient presents with   Fall    HPI Jamille Yoshino is a 76 y.o. male.   Patient presents for left rib cage pain that began this afternoon when getting into his tall truck.  Reports about 6 days ago, he fell on his left side and has a bruise on his rib cage area.  Reports initially, he was having some rib pain, however this has improved until today.  Reports when he was stepping into his truck, he felt a pop in his left back where his ribs are and pain began.  He is now having pain when taking a deep breath and with any movement.  No redness, swelling, or bruising to the area.  No fevers or nausea/vomiting since the pain began.  No radiation of pain down his arm or leg.  Has been taking Tylenol for pain which helps minimally.  Patient reports he does take Eliquis for history of atrial fibrillation.  He did not hit his head or lose consciousness when he fell.  Patient reports medical history significant for pulmonary fibrosis, interstitial lung disease.    Past Medical History:  Diagnosis Date   Allergy to alpha-gal    2015; has been able to re-introduce red meat for the past 2 1/2 years without reaction (as of 01/21/19)   Arthritis    Chest pain    a. 03/2017: echo showing EF of 60-65%, no regional WMA or significant valve abnormalities. b. 03/2016: NST with no evidence of ischemia.    Complication of anesthesia    "anaphylactic" reaction to Xylocaine > 10 years ago; reported later recieved marcaine without issue   Difficult intubation 02/19/2018   No difficulty 01/26/19 with Mac and 3 or 11/10/19 with Glidescope   Digestive problems    on Prednisone prn for this issue- diarrhea   Dyspnea    with activity   Dysrhythmia    irregular due to Myocarditis- takes Atenolol, Norvasc   GERD (gastroesophageal reflux disease)    H/O viral myocarditis    25  years ago   Head injury, closed, with concussion    Breif LOC   Hyperlipidemia    Mild mitral valve prolapse    per Dr Evette Georges notes   PAF (paroxysmal atrial fibrillation) ([place])    a. initially occuring in 02/2016. b. recurrent in 07/2016. Placed on Eliquis   Polymyalgia rheumatica (HCC)    Pre-diabetes    Pulmonary fibrosis (HCC)    Sleep apnea    mild sleep apnea   no CPAP    Patient Active Problem List   Diagnosis Date Noted   Spinal stenosis of lumbar region with neurogenic claudication 12/22/2020   Pre-operative respiratory examination 12/13/2020   Cervical spondylosis with myelopathy 11/10/2019   Shortness of breath 08/04/2019   Colostomy in place ([place]) 01/26/2019   Prolonged Q-T interval on ECG 06/25/2018   Immunosuppression due to drug therapy for ILD 06/16/2018   Sigmoid colon perforation s/p Hartmann colectomy/colostomy 06/15/2018 06/15/2018   Perforated diverticulum of large intestine 06/15/2018   OSA (obstructive sleep apnea) 12/13/2016   Snoring 10/26/2016   PAF (paroxysmal atrial fibrillation) ([place]) 08/14/2016   ILD (interstitial lung disease) ([place]) 05/01/2016   Lumbar stenosis 02/01/2015   Giardia 04/14/2014   H/O viral cardiomyopathy 04/14/2014   Herniated nucleus pulposus, L3-4 right 05/05/2013   Other hypertrophic cardiomyopathy (  [place]) 04/19/2013   Hyperlipidemia 04/19/2013   Essential hypertension 04/19/2013   Abnormal LFTs 09/16/2012   Nausea 07/10/2011   Chronic diarrhea 07/10/2011   Burping 07/10/2011   Osteoporosis 07/10/2011   Arthritis 07/10/2011    Past Surgical History:  Procedure Laterality Date   ANTERIOR CERVICAL DECOMP/DISCECTOMY FUSION N/A 11/10/2019   Procedure: Cervical Three-Four Anterior cervical decompression/discectomy/fusion;  Surgeon: [name], MD;  Location: [place];  Service: Neurosurgery;  Laterality: N/A;  Cervical Three-Four Anterior cervical decompression/discectomy/fusion   apendectomy  1965   APPENDECTOMY      APPLICATION OF ROBOTIC ASSISTANCE FOR SPINAL PROCEDURE N/A 12/22/2020   Procedure: APPLICATION OF ROBOTIC ASSISTANCE FOR SPINAL PROCEDURE;  Surgeon: [name], MD;  Location: [place];  Service: Neurosurgery;  Laterality: N/A;   BACK SURGERY     bone spur Bilateral 1999   feet   CARDIAC CATHETERIZATION  2001   CHOLECYSTECTOMY  2004   COLON RESECTION N/A 06/15/2018   Procedure: SIGMOID COLON RESECTION;  Surgeon: [name], MD;  Location: [facility];  Service: General;  Laterality: N/A;   COLONOSCOPY     COLOSTOMY N/A 06/15/2018   Procedure: COLOSTOMY;  Surgeon: [name], MD;  Location: [facility];  Service: General;  Laterality: N/A;   COLOSTOMY TAKEDOWN N/A 01/26/2019   Procedure: LAPAROSCOPIC ASSISTED COLOSTOMY TAKEDOWN;  Surgeon: [name], MD;  Location: [place];  Service: General;  Laterality: N/A;   EYE SURGERY Bilateral    cataract removal   LAPAROSCOPIC LYSIS OF ADHESIONS N/A 01/26/2019   Procedure: LAPAROSCOPIC LYSIS OF ADHESIONS;  Surgeon: [name], MD;  Location: [place];  Service: General;  Laterality: N/A;   LAPAROTOMY N/A 06/15/2018   Procedure: EXPLORATORY LAPAROTOMY;  Surgeon: [name], MD;  Location: [facility];  Service: General;  Laterality: N/A;   LUMBAR LAMINECTOMY/ DECOMPRESSION WITH MET-RX Right 05/05/2013   Procedure: Right Lumbar three-four Extraforaminal Microdiskectomy with Metrex;  Surgeon: [name], MD;  Location: [facility];  Service: Neurosurgery;  Laterality: Right;  Right Lumbar three-four Extraforaminal Microdiskectomy with Metrex   LUNG BIOPSY Right 02/19/2018   Procedure: LUNG BIOPSY;  Surgeon: [name], MD;  Location: [place];  Service: Thoracic;  Laterality: Right;   POSTERIOR LUMBAR FUSION 4 LEVEL N/A 12/22/2020   Procedure: Lumbar one-two Posterior lumbar interbody fusion with stabilization from Thoracic ten to Lumbar one with robotic screw placement;  Surgeon: [name], MD;  Location: [place];  Service: Neurosurgery;  Laterality:  N/A;  RM 20   SHOULDER OPEN ROTATOR CUFF REPAIR Bilateral 2001   TONSILLECTOMY     VIDEO ASSISTED THORACOSCOPY Right 02/19/2018   Procedure: VIDEO ASSISTED THORACOSCOPY;  Surgeon: [name], MD;  Location: [place];  Service: Thoracic;  Laterality: Right;       Home Medications    Prior to Admission medications   Medication Sig Start Date End Date Taking? Authorizing Provider  acetaminophen (TYLENOL 8 HOUR) 650 MG CR tablet Take 1 tablet (650 mg total) by mouth every 8 (eight) hours as needed for pain. 12/17/21   Leath-Warren, Alda Lea, NP  budesonide-formoterol (SYMBICORT) 80-4.5 MCG/ACT inhaler Inhale 2 puffs into the lungs 2 (two) times daily as needed (shortness of breath).    [provider]  Capsaicin (CAPSAICIN TOPICAL PAIN PATCH) 0.025 % PTCH Apply 1 patch topically every 12 (twelve) hours. 12/17/21   Leath-Warren, Alda Lea, NP  cetirizine (ZYRTEC) 10 MG tablet Take 10 mg by mouth daily as needed for allergies.  [provider]  Cholecalciferol (DIALYVITE VITAMIN D 5000) 125 MCG (5000 UT) capsule Take 5,000 Units by mouth daily.    [provider]  diltiazem (CARDIZEM CD) 120 MG 24 hr capsule TAKE ONE CAPSULE BY MOUTH DAILY 03/09/22   Troy Sine, MD  diphenoxylate-atropine (LOMOTIL) 2.5-0.025 MG tablet Take 1 tablet by mouth 4 (four) times daily as needed for diarrhea or loose stools.    [provider]  ELIQUIS 5 MG TABS tablet TAKE ONE TABLET (5MG TOTAL) BY MOUTH TWODAILY Patient taking differently: Take 5 mg by mouth 2 (two) times daily. 01/28/18   Troy Sine, MD  ezetimibe (ZETIA) 10 MG tablet TAKE ONE (1) TABLET BY MOUTH EVERY DAY 07/06/21   Troy Sine, MD  fluticasone [facility]) 50 MCG/ACT nasal spray Place 2 sprays into both nostrils daily as needed for allergies or rhinitis.    [provider]  gabapentin (NEURONTIN) 300 MG capsule Take 300 mg by mouth 2 (two) times daily. 06/11/18   [provider]   hydroxychloroquine (PLAQUENIL) 200 MG tablet Take 200 mg by mouth 2 (two) times daily. 09/22/20   [provider]  metoprolol tartrate (LOPRESSOR) 25 MG tablet TAKE ONE TABLET BY MOUTH TWICE A DAY Patient taking differently: Take 25 mg by mouth 2 (two) times daily. 02/25/18   Troy Sine, MD  nystatin (MYCOSTATIN) 100000 UNIT/ML suspension Take 5 mLs (500,000 Units total) by mouth every other day. 08/27/19   Noemi Chapel P, DO  ondansetron (ZOFRAN) 4 MG tablet Take 4 mg by mouth every 8 (eight) hours as needed for nausea or vomiting.     [provider]  predniSONE (DELTASONE) 5 MG tablet Take 5 mg by mouth daily.     [provider]  rosuvastatin (CRESTOR) 10 MG tablet TAKE ONE-HALF TABLET (5MG TOTAL) BY MOUTH ONCE A WEEK 02/26/22   Troy Sine, MD  tamsulosin (FLOMAX) 0.4 MG CAPS capsule Take 0.4 mg by mouth 2 (two) times daily.     [provider]  Trolamine Salicylate (ASPERCREME EX) Apply 1 application topically daily as needed (arthritis pain).    [provider]    Family History Family History  Problem Relation Age of Onset   Rheum arthritis Mother    Thyroid cancer Mother    Heart disease Father    Asthma Father    Thyroid cancer Sister    Melanoma Brother     Social History Social History   Tobacco Use   Smoking status: Never   Smokeless tobacco: Never  Vaping Use   Vaping Use: Never used  Substance Use Topics   Alcohol use: Yes    Alcohol/week: 21.0 standard drinks of alcohol    Types: 21 Glasses of wine per week    Comment: 2/3 glasses wine in evening   Drug use: No     Allergies   Xylocaine [lidocaine], Codeine, Statins, Aldactone [spironolactone], Alpha-gal, Pirfenidone, and Percocet [oxycodone-acetaminophen]   Review of Systems Review of Systems Per HPI  Physical Exam Triage Vital Signs ED Triage Vitals  Enc Vitals Group     BP 04/10/22 1554 (!) 140/84     Pulse Rate 04/10/22 1554 (!) 107     Resp  04/10/22 1554 20     Temp 04/10/22 1554 97.6 F (36.4 C)     Temp Source 04/10/22 1554 Oral     SpO2 04/10/22 1554 93 %     Weight --      Height --  Head Circumference --      Peak Flow --      Pain Score 04/10/22 1557 3     Pain Loc --      Pain Edu? --      Excl. in [place]? --    No data found.  Updated Vital Signs BP (!) 140/84 (BP Location: Right Arm)   Pulse (!) 107   Temp 97.6 F (36.4 C) (Oral)   Resp 20   SpO2 93%   Visual Acuity Right Eye Distance:   Left Eye Distance:   Bilateral Distance:    Right Eye Near:   Left Eye Near:    Bilateral Near:     Physical Exam Vitals and nursing note reviewed.  Constitutional:      General: He is not in acute distress.    Appearance: Normal appearance. He is not toxic-appearing.  Pulmonary:     Effort: Pulmonary effort is normal. No respiratory distress.     Breath sounds: No wheezing, rhonchi or rales.  Chest:       Comments: Ecchymosis to area noted; no swelling or erythema.  There is point tenderness to ecchymosis. Musculoskeletal:       Arms:     Cervical back: Normal range of motion.     Comments: Tender to palpation diffusely in area marked; no bruising, erythema, swelling.  No CVA tenderness.  Skin:    General: Skin is warm and dry.     Capillary Refill: Capillary refill takes less than 2 seconds.     Coloration: Skin is not jaundiced or pale.     Findings: Bruising present. No erythema.  Neurological:     Mental Status: He is alert and oriented to person, place, and time.  Psychiatric:        Behavior: Behavior is cooperative.      UC Treatments / Results  Labs (all labs ordered are listed, but only abnormal results are displayed) Labs Reviewed - No data to display  EKG   Radiology DG Ribs Unilateral W/Chest Left  Result Date: 04/10/2022 CLINICAL DATA:  Leftward and after a fall 5 days ago. EXAM: LEFT RIBS AND CHEST - 3+ VIEW COMPARISON:  Chest radiographs 08/04/2019.  Chest CTA  01/09/2022. FINDINGS: The cardiomediastinal silhouette is unchanged with normal heart size. Aortic atherosclerosis is noted. Postoperative changes are again noted in the right lung. Bilateral interstitial densities are similar to the prior chest radiographs. No acute airspace consolidation, overt pulmonary edema, sizable pleural effusion, or pneumothorax is identified. There is a new left lateral ninth rib fracture with at most minimal displacement. Advanced left glenohumeral arthropathy is noted. IMPRESSION: Left lateral ninth rib fracture. Electronically Signed   By: Logan Bores M.D.   On: 04/10/2022 16:33    Procedures Procedures (including critical care time)  Medications Ordered in UC Medications - No data to display  Initial Impression / Assessment and Plan / UC Course  I have reviewed the triage vital signs and the nursing notes.  Pertinent labs & imaging results that were available during my care of the patient were reviewed by me and considered in my medical decision making (see chart for details).   Patient is well-appearing, normotensive, afebrile, not tachypneic, oxygenating well on room air.  He is mildly tachycardic today, likely secondary to pain.  Closed fracture of one rib of left side, initial encounter X-ray imaging today shows left lateral ninth rib fracture.  I suspect this is anterior located with the bruises.  Supportive care  discussed.  Recommended continued acetaminophen, can also use topical lidocaine patches.  Recommended increasing pulmonary hygiene and encouraged deep breathing.  Educated that this may take a few weeks to fully improve.  Costochondritis Suspect costochondritis to posterior rib cage area.  Supportive care discussed; see above.  ER and return precautions discussed.  The patient was given the opportunity to ask questions.  All questions answered to their satisfaction.  The patient is in agreement to this plan.    Final Clinical Impressions(s) / UC  Diagnoses   Final diagnoses:  Closed fracture of one rib of left side, initial encounter  Costochondritis     Discharge Instructions      The rib x-ray today shows a fracture on your ninth lateral rib.  I believe this is where the bruises on the front of your chest.  You can continue Tylenol as needed for pain.  You can also use a topical lidocaine patch for pain.  You can remain active, make sure you are taking plenty of deep breaths to help keep your lungs open.  Follow-up here or with primary care provider if symptoms persist or worsen despite treatment.  If you develop high fevers, cough, congestion, or pain with breathing worsens, follow-up.    ED Prescriptions   None    PDMP not reviewed this encounter.   Eulogio Bear, NP 04/10/22 1840

## 2022-04-12 DIAGNOSIS — I10 Essential (primary) hypertension: Secondary | ICD-10-CM | POA: Diagnosis not present

## 2022-04-12 DIAGNOSIS — J849 Interstitial pulmonary disease, unspecified: Secondary | ICD-10-CM | POA: Diagnosis not present

## 2022-04-12 DIAGNOSIS — G720 Drug-induced myopathy: Secondary | ICD-10-CM | POA: Diagnosis not present

## 2022-04-12 DIAGNOSIS — E1169 Type 2 diabetes mellitus with other specified complication: Secondary | ICD-10-CM | POA: Diagnosis not present

## 2022-04-12 DIAGNOSIS — S2232XD Fracture of one rib, left side, subsequent encounter for fracture with routine healing: Secondary | ICD-10-CM | POA: Diagnosis not present

## 2022-04-12 DIAGNOSIS — I2510 Atherosclerotic heart disease of native coronary artery without angina pectoris: Secondary | ICD-10-CM | POA: Diagnosis not present

## 2022-04-12 DIAGNOSIS — M353 Polymyalgia rheumatica: Secondary | ICD-10-CM | POA: Diagnosis not present

## 2022-04-12 DIAGNOSIS — R0600 Dyspnea, unspecified: Secondary | ICD-10-CM | POA: Diagnosis not present

## 2022-04-12 DIAGNOSIS — I482 Chronic atrial fibrillation, unspecified: Secondary | ICD-10-CM | POA: Diagnosis not present

## 2022-04-12 DIAGNOSIS — G4730 Sleep apnea, unspecified: Secondary | ICD-10-CM | POA: Diagnosis not present

## 2022-04-12 DIAGNOSIS — M25519 Pain in unspecified shoulder: Secondary | ICD-10-CM | POA: Diagnosis not present

## 2022-04-12 DIAGNOSIS — E785 Hyperlipidemia, unspecified: Secondary | ICD-10-CM | POA: Diagnosis not present

## 2022-05-18 ENCOUNTER — Other Ambulatory Visit: Payer: Self-pay | Admitting: Orthopaedic Surgery

## 2022-05-18 DIAGNOSIS — Z01818 Encounter for other preprocedural examination: Secondary | ICD-10-CM

## 2022-05-18 DIAGNOSIS — M25511 Pain in right shoulder: Secondary | ICD-10-CM | POA: Diagnosis not present

## 2022-05-21 ENCOUNTER — Telehealth: Payer: Self-pay | Admitting: *Deleted

## 2022-05-21 NOTE — Telephone Encounter (Signed)
Patient with diagnosis of afib on Eliquis for anticoagulation.    Procedure:  RIGHT REVERSE TOTAL SHOULDER ARTHROPLASTY   Date of procedure: TBD   CHA2DS2-VASc Score = 4   This indicates a 4.8% annual risk of stroke. The patient's score is based upon: CHF History: 1 HTN History: 1 Diabetes History: 0 Stroke History: 0 Vascular Disease History: 1 Age Score: 1 Gender Score: 0     CrCl 74 ml/min Platelet count 200  Per office protocol, patient can hold Eliquis for 3 days prior to procedure.    **This guidance is not considered finalized until pre-operative APP has relayed final recommendations.**

## 2022-05-21 NOTE — Telephone Encounter (Signed)
   Name: [name]  DOB: 05/19/1945  MRN: [id number]  Primary Cardiologist: Shelva Majestic, MD  Chart reviewed as part of pre-operative protocol coverage. Because of [name]'s past medical history and time since last visit, he will require a follow-up in-office visit in order to better assess preoperative cardiovascular risk.  Pre-op covering staff: - Please schedule appointment and call patient to inform them. If patient already had an upcoming appointment within acceptable timeframe, please add "pre-op clearance" to the appointment notes so provider is aware. - Please contact requesting surgeon's office via preferred method (i.e, phone, fax) to inform them of need for appointment prior to surgery.  Per office protocol, patient can hold Eliquis for 3 days prior to procedure.   Elgie Collard, PA-C  05/21/2022, 1:30 PM

## 2022-05-21 NOTE — Telephone Encounter (Signed)
   Pre-operative Risk Assessment    Patient Name: [name]  DOB: 09-29-1945 MRN: [id number]      Request for Surgical Clearance    Procedure:   RIGHT REVERSE TOTAL SHOULDER ARTHROPLASTY  Date of Surgery:  Clearance TBD                                 Surgeon:  DR. Ophelia Charter Surgeon's Group or Practice Name:  [facility] Phone number:  [phone] EXT [phone] ATTN" Derek Jack Fax number:  [phone]   Type of Clearance Requested:   - Medical  - Pharmacy:  Hold Apixaban (Eliquis)     Type of Anesthesia:  General  WITH INTERSCALENE BLOCK   Additional requests/questions:    Jiles Prows   05/21/2022, 8:14 AM

## 2022-05-22 DIAGNOSIS — X32XXXD Exposure to sunlight, subsequent encounter: Secondary | ICD-10-CM | POA: Diagnosis not present

## 2022-05-22 DIAGNOSIS — L570 Actinic keratosis: Secondary | ICD-10-CM | POA: Diagnosis not present

## 2022-05-22 NOTE — Telephone Encounter (Signed)
CORRECT FAX # [phone]

## 2022-05-22 NOTE — Telephone Encounter (Signed)
Pt has been scheduled to see Diona Browner, NP 05/28/22 @ 1:55 at the [facility] location for pre op clearance. Pt thanked me for the clall and the help.

## 2022-05-23 ENCOUNTER — Telehealth: Payer: Self-pay | Admitting: Internal Medicine

## 2022-05-23 NOTE — Telephone Encounter (Signed)
Fax received from Dr. Ophelia Charter with [facility] to perform a Right Reverse Total Shoulder Arthroplasty with general anesthesia and interscalen block on patient.  Patient needs surgery clearance. Surgery is PENDING. Patient was seen on 02/01/2022. Office protocol is a risk assessment can be sent to surgeon if patient has been seen in 60 days or less.   Sending to Dr. Chase Caller for risk assessment or recommendations if patient needs to be seen in office prior to surgical procedure.    Patient has office visit with Dr Chase Caller on 06/05/2022

## 2022-05-24 NOTE — Telephone Encounter (Signed)
It has been more than 100 days since the patient last saw me on February 01, 2022.  At that time the pulmonary function test was declining.  Therefore I believe it is in the patient's best interest to come and visit with the nurse practitioner to her symptom score and walking desaturation test and get a formal preoperative evaluation done even though this is limb surgery which is a lower risk compared to heart surgery

## 2022-05-28 ENCOUNTER — Ambulatory Visit: Payer: PPO | Attending: Nurse Practitioner | Admitting: Nurse Practitioner

## 2022-05-28 ENCOUNTER — Encounter: Payer: Self-pay | Admitting: Nurse Practitioner

## 2022-05-28 VITALS — BP 120/74 | HR 80 | Ht 68.0 in | Wt 189.0 lb

## 2022-05-28 DIAGNOSIS — I480 Paroxysmal atrial fibrillation: Secondary | ICD-10-CM

## 2022-05-28 DIAGNOSIS — I2510 Atherosclerotic heart disease of native coronary artery without angina pectoris: Secondary | ICD-10-CM | POA: Diagnosis not present

## 2022-05-28 DIAGNOSIS — R600 Localized edema: Secondary | ICD-10-CM | POA: Diagnosis not present

## 2022-05-28 DIAGNOSIS — Z01810 Encounter for preprocedural cardiovascular examination: Secondary | ICD-10-CM

## 2022-05-28 DIAGNOSIS — G4733 Obstructive sleep apnea (adult) (pediatric): Secondary | ICD-10-CM

## 2022-05-28 DIAGNOSIS — E785 Hyperlipidemia, unspecified: Secondary | ICD-10-CM

## 2022-05-28 DIAGNOSIS — J849 Interstitial pulmonary disease, unspecified: Secondary | ICD-10-CM | POA: Diagnosis not present

## 2022-05-28 NOTE — Progress Notes (Signed)
Office Visit    Patient Name: [name] Date of Encounter: 05/28/2022  Primary Care Provider:  [name], MD Primary Cardiologist:  Shelva Majestic, MD  Chief Complaint    77 year old male with a history of CAD, paroxysmal atrial fibrillation, bilateral lower extremity edema, fibrosing nonspecific interstitial pneumonitis, viral myocarditis, hyperlipidemia, OSA, and alpha gal who presents for preoperative cardiac evaluation.  Past Medical History    Past Medical History:  Diagnosis Date   Allergy to alpha-gal    2015; has been able to re-introduce red meat for the past 2 1/2 years without reaction (as of 01/21/19)   Arthritis    Chest pain    a. 03/2017: echo showing EF of 60-65%, no regional WMA or significant valve abnormalities. b. 03/2016: NST with no evidence of ischemia.    Complication of anesthesia    "anaphylactic" reaction to Xylocaine > 10 years ago; reported later recieved marcaine without issue   Difficult intubation 02/19/2018   No difficulty 01/26/19 with Mac and 3 or 11/10/19 with Glidescope   Digestive problems    on Prednisone prn for this issue- diarrhea   Dyspnea    with activity   Dysrhythmia    irregular due to Myocarditis- takes Atenolol, Norvasc   GERD (gastroesophageal reflux disease)    H/O viral myocarditis    25 years ago   Head injury, closed, with concussion    Breif LOC   Hyperlipidemia    Mild mitral valve prolapse    per Dr Evette Georges notes   PAF (paroxysmal atrial fibrillation) ([place])    a. initially occuring in 02/2016. b. recurrent in 07/2016. Placed on Eliquis   Polymyalgia rheumatica (HCC)    Pre-diabetes    Pulmonary fibrosis (HCC)    Sleep apnea    mild sleep apnea   no CPAP   Past Surgical History:  Procedure Laterality Date   ANTERIOR CERVICAL DECOMP/DISCECTOMY FUSION N/A 11/10/2019   Procedure: Cervical Three-Four Anterior cervical decompression/discectomy/fusion;  Surgeon: [name], MD;  Location: [facility];   Service: Neurosurgery;  Laterality: N/A;  Cervical Three-Four Anterior cervical decompression/discectomy/fusion   apendectomy  1965   APPENDECTOMY     APPLICATION OF ROBOTIC ASSISTANCE FOR SPINAL PROCEDURE N/A 12/22/2020   Procedure: APPLICATION OF ROBOTIC ASSISTANCE FOR SPINAL PROCEDURE;  Surgeon: [name], MD;  Location: [facility];  Service: Neurosurgery;  Laterality: N/A;   BACK SURGERY     bone spur Bilateral 1999   feet   CARDIAC CATHETERIZATION  2001   CHOLECYSTECTOMY  2004   COLON RESECTION N/A 06/15/2018   Procedure: SIGMOID COLON RESECTION;  Surgeon: [name], MD;  Location: [facility];  Service: General;  Laterality: N/A;   COLONOSCOPY     COLOSTOMY N/A 06/15/2018   Procedure: COLOSTOMY;  Surgeon: [name], MD;  Location: [facility];  Service: General;  Laterality: N/A;   COLOSTOMY TAKEDOWN N/A 01/26/2019   Procedure: LAPAROSCOPIC ASSISTED COLOSTOMY TAKEDOWN;  Surgeon: [name], MD;  Location: [facility];  Service: General;  Laterality: N/A;   EYE SURGERY Bilateral    cataract removal   LAPAROSCOPIC LYSIS OF ADHESIONS N/A 01/26/2019   Procedure: LAPAROSCOPIC LYSIS OF ADHESIONS;  Surgeon: [name], MD;  Location: [facility];  Service: General;  Laterality: N/A;   LAPAROTOMY N/A 06/15/2018   Procedure: EXPLORATORY LAPAROTOMY;  Surgeon: [name], MD;  Location: [facility];  Service: General;  Laterality: N/A;   LUMBAR LAMINECTOMY/ DECOMPRESSION WITH MET-RX Right 05/05/2013   Procedure:  Right Lumbar three-four Extraforaminal Microdiskectomy with Metrex;  Surgeon: [name], MD;  Location: [facility];  Service: Neurosurgery;  Laterality: Right;  Right Lumbar three-four Extraforaminal Microdiskectomy with Metrex   LUNG BIOPSY Right 02/19/2018   Procedure: LUNG BIOPSY;  Surgeon: [name], MD;  Location: [facility];  Service: Thoracic;  Laterality: Right;   POSTERIOR LUMBAR FUSION 4 LEVEL N/A 12/22/2020   Procedure: Lumbar one-two Posterior lumbar interbody fusion with  stabilization from Thoracic ten to Lumbar one with robotic screw placement;  Surgeon: [name], MD;  Location: [facility];  Service: Neurosurgery;  Laterality: N/A;  RM 20   SHOULDER OPEN ROTATOR CUFF REPAIR Bilateral 2001   TONSILLECTOMY     VIDEO ASSISTED THORACOSCOPY Right 02/19/2018   Procedure: VIDEO ASSISTED THORACOSCOPY;  Surgeon: [name], MD;  Location: [facility];  Service: Thoracic;  Laterality: Right;    Allergies  Allergies  Allergen Reactions   Xylocaine [Lidocaine] Anaphylaxis    Injection form   Codeine Nausea And Vomiting   Statins Other (See Comments)    Muscle soreness and an aching feeling all over Myalgias   Aldactone [Spironolactone]     Sore nipples and chest   Alpha-Gal     2015; has been able to re-introduce red meat for the past 2 1/2 years without reaction (as of 01/21/19)   Pirfenidone Other (See Comments)    GI side effects   Percocet [Oxycodone-Acetaminophen] Itching     Labs/Other Studies Reviewed    The following studies were reviewed today: Echo 01/08/2022: IMPRESSIONS    1. Left ventricular ejection fraction, by estimation, is 55 to 60%. The  left ventricle has normal function. The left ventricle has no regional  wall motion abnormalities. Left ventricular diastolic parameters are  consistent with Grade I diastolic  dysfunction (impaired relaxation).   2. Right ventricular systolic function is normal. The right ventricular  size is normal.   3. The mitral valve is normal in structure. No evidence of mitral valve  regurgitation. No evidence of mitral stenosis.   4. The aortic valve is tricuspid. Aortic valve regurgitation is not  visualized. No aortic stenosis is present.   5. The inferior vena cava is normal in size with greater than 50%  respiratory variability, suggesting right atrial pressure of 3 mmHg.   Comparison(s): No significant change from prior study. Prior images  reviewed side by side.    Conclusion(s)/Recommendation(s): Normal biventricular function without  evidence of hemodynamically significant valvular heart disease.   Recent Labs: 10/31/2021: ALT 17; BUN 10; Creatinine, Ser 0.93; Hemoglobin 16.6; Platelets 200.0; Potassium 4.2; Pro B Natriuretic peptide (BNP) 40.0; Sodium 139  Recent Lipid Panel    Component Value Date/Time   CHOL 222 (H) 07/16/2016 0902   TRIG 359 (H) 07/16/2016 0902   HDL 67 07/16/2016 0902   CHOLHDL 3.3 07/16/2016 0902   VLDL 72 (H) 07/16/2016 0902   LDLCALC 83 07/16/2016 0902    History of Present Illness    77 year old male with the above past medical history including CAD, paroxysmal atrial fibrillation, bilateral lower extremity edema, fibrosing nonspecific interstitial pneumonitis, viral myocarditis, hyperlipidemia, OSA, and alpha gal.    History of prior viral myocarditis dating back to 1989, EF for restriction was 25% at the time which normalized on subsequent check.  He also has a history of mitral valve prolapse.  Cardiac catheterization in 2001 showed only mild midsystolic LAD bridging.  He was diagnosed with atrial fibrillation in 2017. Echocardiogram at that time  showed EF 60 to 65%, G1 DD, mildly dilated aortic root measuring 38 mm.  Myoview showed normal EF, no evidence of ischemia. He is on Eliquis for anticoagulation.  He follows with pulmonology for interstitial lung disease.  He was last seen in office on 09/28/2020 and was stable from a cardiac standpoint.  Most recent echocardiogram in 12/2021 showed EF 55 to 60%, G1 DD, no significant valvular abnormalities.   He presents today for follow-up and for preoperative cardiac evaluation for right reverse total shoulder arthroplasty with Dr. Ophelia Charter of [facility].  Since his last visit he has done well from a cardiac standpoint.  He has stable chronic dyspnea in the setting of ILD.  Following with pulmonology.  He denies any chest pain, palpitations, dizziness, edema,  PND, orthopnea, weight gain.  Overall, he reports feeling well.  Home Medications    Current Outpatient Medications  Medication Sig Dispense Refill   acetaminophen (TYLENOL 8 HOUR) 650 MG CR tablet Take 1 tablet (650 mg total) by mouth every 8 (eight) hours as needed for pain. 30 tablet 0   budesonide-formoterol (SYMBICORT) 80-4.5 MCG/ACT inhaler Inhale 2 puffs into the lungs 2 (two) times daily as needed (shortness of breath).     Capsaicin (CAPSAICIN TOPICAL PAIN PATCH) 0.025 % PTCH Apply 1 patch topically every 12 (twelve) hours. 10 patch 1   cetirizine (ZYRTEC) 10 MG tablet Take 10 mg by mouth daily as needed for allergies.      Cholecalciferol (DIALYVITE VITAMIN D 5000) 125 MCG (5000 UT) capsule Take 5,000 Units by mouth daily.     diltiazem (CARDIZEM CD) 120 MG 24 hr capsule TAKE ONE CAPSULE BY MOUTH DAILY 90 capsule 0   diphenoxylate-atropine (LOMOTIL) 2.5-0.025 MG tablet Take 1 tablet by mouth 4 (four) times daily as needed for diarrhea or loose stools.     ELIQUIS 5 MG TABS tablet TAKE ONE TABLET (5MG TOTAL) BY MOUTH TWODAILY (Patient taking differently: Take 5 mg by mouth 2 (two) times daily.) 180 tablet 1   ezetimibe (ZETIA) 10 MG tablet TAKE ONE (1) TABLET BY MOUTH EVERY DAY 90 tablet 3   fluticasone (FLONASE) 50 MCG/ACT nasal spray Place 2 sprays into both nostrils daily as needed for allergies or rhinitis.     gabapentin (NEURONTIN) 300 MG capsule Take 300 mg by mouth 2 (two) times daily.     hydroxychloroquine (PLAQUENIL) 200 MG tablet Take 200 mg by mouth 2 (two) times daily.     metoprolol tartrate (LOPRESSOR) 25 MG tablet TAKE ONE TABLET BY MOUTH TWICE A DAY (Patient taking differently: Take 25 mg by mouth 2 (two) times daily.) 180 tablet 3   ondansetron (ZOFRAN) 4 MG tablet Take 4 mg by mouth every 8 (eight) hours as needed for nausea or vomiting.      predniSONE (DELTASONE) 5 MG tablet Take 5 mg by mouth daily.      rosuvastatin (CRESTOR) 10 MG tablet TAKE ONE-HALF TABLET  (5MG TOTAL) BY MOUTH ONCE A WEEK 20 tablet 2   tamsulosin (FLOMAX) 0.4 MG CAPS capsule Take 0.4 mg by mouth 2 (two) times daily.      Trolamine Salicylate (ASPERCREME EX) Apply 1 application topically daily as needed (arthritis pain).     No current facility-administered medications for this visit.     Review of Systems    He denies chest pain, palpitations, pnd, orthopnea, n, v, dizziness, syncope, edema, weight gain, or early satiety. All other systems reviewed and are otherwise negative except as  noted above.   Physical Exam    VS:  BP 120/74 (BP Location: Left Arm, Patient Position: Sitting, Cuff Size: Normal)   Pulse 80   Ht _0  (1.727 m)   Wt 189 lb (85.7 kg)   BMI 28.74 kg/m  GEN: Well nourished, well developed, in no acute distress. HEENT: normal. Neck: Supple, no JVD, carotid bruits, or masses. Cardiac: RRR, no murmurs, rubs, or gallops. No clubbing, cyanosis, edema.  Radials/DP/PT 2+ and equal bilaterally.  Respiratory:  Respirations regular and unlabored, clear to auscultation bilaterally. GI: Soft, nontender, nondistended, BS + x 4. MS: no deformity or atrophy. Skin: warm and dry, no rash. Neuro:  Strength and sensation are intact. Psych: Normal affect.  Accessory Clinical Findings    ECG personally reviewed by me today -NSR, 80 bpm, low voltage- no acute changes.   Lab Results  Component Value Date   WBC 8.4 10/31/2021   HGB 16.6 10/31/2021   HCT 48.0 10/31/2021   MCV 103.8 (H) 10/31/2021   PLT 200.0 10/31/2021   Lab Results  Component Value Date   CREATININE 0.93 10/31/2021   BUN 10 10/31/2021   NA 139 10/31/2021   K 4.2 10/31/2021   CL 104 10/31/2021   CO2 27 10/31/2021   Lab Results  Component Value Date   ALT 17 10/31/2021   AST 16 10/31/2021   ALKPHOS 66 10/31/2021   BILITOT 1.4 (H) 10/31/2021   Lab Results  Component Value Date   CHOL 222 (H) 07/16/2016   HDL 67 07/16/2016   LDLCALC 83 07/16/2016   TRIG 359 (H) 07/16/2016   CHOLHDL  3.3 07/16/2016    Lab Results  Component Value Date   HGBA1C 6.9 (H) 02/17/2018    Assessment & Plan    1. CAD: Cath in 2001 showed only mild midsystolic LAD bridging.  Stable with no anginal symptoms. No indication for ischemic evaluation.  Continue metoprolol, Crestor, Zetia.  2. Paroxysmal atrial fibrillation: Maintaining NSR.  He denies any breakthrough atrial fibrillation. Continue diltiazem, metoprolol, Eliquis.  3. Bilateral lower extremity edema: Denies any recent swelling.  Euvolemic and well compensated on exam.  4. ILD: Stable chronic dyspnea. Following with pulmonology.  5. Hyperlipidemia: LDL was 61 in 12/2021.  Continue Crestor, Zetia.  6. OSA: Not on CPAP.  7. Preoperative cardiac exam: According to the Revised Cardiac Risk Index (RCRI), his Perioperative Risk of Major Cardiac Event is (%): 0.4. His Functional Capacity in METs is: 5.04 according to the Duke Activity Status Index (DASI). Therefore, based on ACC/AHA guidelines, patient would be at acceptable risk for the planned procedure without further cardiovascular testing. Per office protocol, patient can hold Eliquis for 3 days prior to procedure. Please resume Eliquis as soon as possible postprocedure at the discretion of the surgeon. I will route this recommendation to the requesting party via Epic fax function.  8. Disposition: Follow-up in 1 year with Dr. Claiborne Billings.     Lenna Sciara, NP 05/28/2022, 5:04 PM

## 2022-05-28 NOTE — Patient Instructions (Signed)
Medication Instructions:  Your physician recommends that you continue on your current medications as directed. Please refer to the Current Medication list given to you today.   *If you need a refill on your cardiac medications before your next appointment, please call your pharmacy*   Lab Work: NONE ordered at this time of appointment   If you have labs (blood work) drawn today and your tests are completely normal, you will receive your results only by: MyChart Message (if you have MyChart) OR A paper copy in the mail If you have any lab test that is abnormal or we need to change your treatment, we will call you to review the results.   Testing/Procedures: NONE ordered at this time of appointment     Follow-Up: At  [facility], you and your health needs are our priority.  As part of our continuing mission to provide you with exceptional heart care, we have created designated Provider Care Teams.  These Care Teams include your primary Cardiologist (physician) and Advanced Practice Providers (APPs -  Physician Assistants and Nurse Practitioners) who all work together to provide you with the care you need, when you need it.  We recommend signing up for the patient portal called "MyChart".  Sign up information is provided on this After Visit Summary.  MyChart is used to connect with patients for Virtual Visits (Telemedicine).  Patients are able to view lab/test results, encounter notes, upcoming appointments, etc.  Non-urgent messages can be sent to your provider as well.   To learn more about what you can do with MyChart, go to [email or web address].    Your next appointment:   1 year(s)  Provider:   Thomas Kelly, MD     Other Instructions  

## 2022-05-31 ENCOUNTER — Telehealth: Payer: Self-pay | Admitting: Internal Medicine

## 2022-06-01 NOTE — Telephone Encounter (Signed)
Called and spoke with pt. Pt did not know who tried to call him. I did let him know info from MR about the surgical clearance and he verbalized understanding. Pt is scheduled for an appt with MR 1/23 having PFT prior.nothing further needed.

## 2022-06-01 NOTE — Telephone Encounter (Signed)
Called and spoke with pt. I did let him know info from MR about the surgical clearance and he verbalized understanding. Pt is scheduled for an appt with MR 1/23 having PFT prior.

## 2022-06-05 ENCOUNTER — Ambulatory Visit: Payer: PPO | Admitting: Internal Medicine

## 2022-06-05 ENCOUNTER — Encounter: Payer: Self-pay | Admitting: Internal Medicine

## 2022-06-05 ENCOUNTER — Ambulatory Visit (INDEPENDENT_AMBULATORY_CARE_PROVIDER_SITE_OTHER): Payer: PPO | Admitting: Internal Medicine

## 2022-06-05 VITALS — BP 124/68 | HR 83 | Temp 98.6°F | Ht 68.0 in | Wt 187.0 lb

## 2022-06-05 DIAGNOSIS — J849 Interstitial pulmonary disease, unspecified: Secondary | ICD-10-CM | POA: Diagnosis not present

## 2022-06-05 DIAGNOSIS — Z7185 Encounter for immunization safety counseling: Secondary | ICD-10-CM | POA: Diagnosis not present

## 2022-06-05 DIAGNOSIS — Z01811 Encounter for preprocedural respiratory examination: Secondary | ICD-10-CM | POA: Diagnosis not present

## 2022-06-05 LAB — PULMONARY FUNCTION TEST
DL/VA % pred: 87 %
DL/VA: 3.49 ml/min/mmHg/L
DLCO cor % pred: 67 %
DLCO cor: 15.96 ml/min/mmHg
DLCO unc % pred: 67 %
DLCO unc: 15.96 ml/min/mmHg
FEF 25-75 Pre: 3.26 L/s
FEF2575-%Pred-Pre: 155 %
FEV1-%Pred-Pre: 90 %
FEV1-Pre: 2.58 L
FEV1FVC-%Pred-Pre: 116 %
FEV6-%Pred-Pre: 82 %
FEV6-Pre: 3.04 L
FEV6FVC-%Pred-Pre: 106 %
FVC-%Pred-Pre: 76 %
FVC-Pre: 3.04 L
Pre FEV1/FVC ratio: 85 %
Pre FEV6/FVC Ratio: 100 %

## 2022-06-05 NOTE — Patient Instructions (Signed)
Spirometry/Diffusion Capacity performed today.   

## 2022-06-05 NOTE — Telephone Encounter (Signed)
OV notes and clearance form have been faxed back to [facility]. Nothing further needed at this time.  °

## 2022-06-05 NOTE — Progress Notes (Signed)
Synopsis: Referred in November 2017 for evaluation of shortness of breath.  Found to have diffuse parenchymal lung disease of undetermined cause. An open lung biopsy performed on 02/19/2018 showed an early fibrotic process suggestive of UIP.  He only smoked cigarettes for about 3 to 6 months when he was a teenager.  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    OCt 2019   Mr. Leabo returns to clinic today to discuss the results from his recent open lung biopsy which showed early fibrotic changes suggestive of UIP as well as emphysema.  He tells me today that he is slowly feeling better but he does still have some fatigue and shortness of breath.  He has not been exercising much lately.  He tried to go fishing yesterday and he says that any walking was quite difficult for him.  He says that he is not coughing too much right now.  He is taking prednisone still as directed by her clinic earlier this month and he is wondering what to do with that.  He is still taking a bronchodilator.  He tells me today that he only smokes cigarettes for about 3 to 6 months when he was a teenager.  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   OV March 2020 [name] has returned to clinic today to see me.  He recently was hospitalized for diverticulitis, perforated, requiring emergent surgery. He says that his wound is slowly healing. He resumed CellCept and prednisone per my recommendation. His wife says that his wound is healing fairly well.  His shortness of breath is at baseline.  No recent pneumonia or bronchitis.  He is taking his nintedanib.   PATIENT ID: Janna Arch Yeager GENDER: male DOB: 06/29/1945, MRN: [id number]  Chief Complaint  Patient presents with   Follow-up    Patient has quit taking the [facility] and has felt a lot  better since then. Patient was taking Stiolto but got thrush and took some Nystatin and it resolved it. Patient needs back surgery in about 3-4 months.      OV 04/16/2019: Mr. Kritikos is a 77 year old gentleman who presents for follow-up of NSIP.  He has had worse symptoms since interrupting his CellCept and nintedanib for his colectomy reversal.  He had bleeding when he initially restarted these medicines a few weeks postop, which required stopping them again.  He restarted his nintedanib last week and CellCept with Bactrim about 2 weeks prior.  He has not had any additional bleeding.  No abdominal pain.  He feels better than he did before surgery.  He still has mild dyspnea, not worse than his last visit.  He notices it most when he is leaning over.  No other complaints.  His PCP did his 33-monthlab work earlier this week, and he will call the office to request they send uKorearesults.   OV 03/19/2019: Mr. RGuedesis a 77year old gentleman with history of ILD to NSIP and polymyalgia rheumatica on chronic immunosuppression who presents for follow-up.  He underwent colostomy reversal on 01/26/2019, for which his CellCept plus Bactrim and nintedanib were held due to concern for potential surgical complications.  Several weeks following surgery his medications were restarted, and within a day or 2 he developed rectal bleeding.  This continued for several days until  he notified us, and we instructed him to stop.  Within the next day or 2 the bleeding has stopped.  He notices that his breathing has started to get a little bit worse over the last few weeks, and he would like to restart his medications.  He feels like he cannot exert himself due to his mild dyspnea.  He denies abdominal pain, fevers, nausea, vomiting, diarrhea, constipation, or periodic rectal bleeding.  He is following up with Dr. Marlou Starks surgery tomorrow.  He had had a colonoscopy just prior to surgery, but is unsure if he had hemorrhoids.  He  follows with [name] in rheumatology.  His prednisone has been tapered down to 7 mg, which will be his dose for now.  There has been mention of possibly starting Plaquenil in the future for his polymyalgia rheumatica.  OV 01/21/19: Mr. Lariccia is a 77 year old gentleman with a history of fibrosing NSIP diagnosed on open lung biopsy in 2019 who presents for routine follow-up and preoperative evaluation before colostomy reversal next week.  He has a history of polymyalgia rheumatica for which she has been on chronic steroids.  He is followed by [name] from rheumatology.  Currently he is being titrated off his chronic prednisone, currently on 8 mg daily.  For his NSIP he is maintained on CellCept, nintedanib, and Bactrim 3 days a week.  He is doing well, better than last year from breathlessness standpoint.  He went hiking with his wife a few weeks ago, which he reports he would have been unable to do a year ago.  He has nausea and diarrhea associated with nintedanib, but he is able to tolerate these with the use of Zofran and Imodium.  No jaundice, itching, right upper quadrant pain.  In February 2020 he underwent emergency partial colectomy with diverting colostomy for perforated diverticulitis.  He is planning to undergo colostomy reversal next week.  In July he fell, causing an L1 compression fracture.  [name] is planning to do a bone mineral density study, and currently he is not on calcium and vitamin D supplements.  In a few months he is planning on having a small outpatient back surgery to address a bone spur that is causing radicular pain in his left leg.  Today he wants to discuss plans for his pulmonary meds perioperatively.  He has previously had symptomatic renal insufficiency when he tried to come off prednisone on his own too quickly.  During his VATS and colectomy he received 10 mg of dexamethasone in the OR, and he received low-dose Solu-Medrol postoperatively from his  colectomy.     OV 07/21/19: Mr. Primeau is a 77 year old gentleman with a history of ILD- UIP vs NSIP on chronic Ofev, mycophenolate who presents for follow-up.  He required disruption of these medications for several months in late 2020 his colostomy reversal.  When the meds were restarted postoperatively he had persistent bleeding, leading to prolonged time off medications and restarting them sequentially.  He feels that his breathing has gotten worse over time since then, but is unsure if that was all due to being off the meds or if it is continued to decline since then.  His breathing is usually worse during warmer weather.  He has noticed that his activities are more limited than they used to be.  He recently had an outpatient back surgery where he had to have bone fragments removed that were pinching and nerves.  He was developing left lower extremity weakness preoperatively.  Due to ongoing nerve impingement that is presumed to be due to inflammation, he is currently on a prednisone taper.  Currently on 20 mg daily and will decrease to 15 mg daily tomorrow.  He is on chronic prednisone for polymyalgia rheumatica at 7 mg daily.  He has been on Ofev 100 mg twice daily.  He was initially started on 150 mg twice daily, but required decrease in his dose due to nausea, vomiting, diarrhea.  Over time the symptoms have improved.  He restarted his meds postoperatively in the fall 2020 he had symptoms short-term, but noticed improvement over time.  When he had GI symptoms at the higher dose, he had some improvement with Zofran and Phenergan.  He is currently not having blood in his stools or other issues related to surgery.    He and his wife are planning a cross-country trip with their RV to visit children and grandchildren later this spring where they will be gone for 2 months.  Reviewed rheumatology, Dr. Melissa Noon clinic note 06/15/2019.  Considering starting Plaquenil to reduce dose of prednisone.   OV  4?!5/21  Mr. Taitt is a 77 year old gentleman with a history of IPF who presents for follow-up.  He is accompanied by his wife today.  He recently had a severe shortness of breath episode that was precipitated by being around a significant amount of sawdust.  He did well with Stiolto, but developed thrush, which has been an issue for him with inhalers in the past.  He is improved with nystatin.  He stopped his Ofev due to severe intractable nausea and vomiting.  He now feels much better and is back to normal.  His shortness of breath is persistent-he has trouble walking around the house.  He has continued on his Bactrim and CellCept plus prednisone for polymyalgia rheumatica.  He wants to know if he can stop his Eliquis to be able to take an NSAID for help with his joint pain.  He has new leg edema bilaterally, but no edema elsewhere.  No chest pain.       OV 10/22/2019  Subjective:  Patient ID: Bjorn Pippin, male , DOB: [birth year] , age 41 y.o. , MRN: [id number] , ADDRESS: [place] [address]   PCP [name], MD Rheum - Dr Jannette Fogo 0Dr McQuaid -> Dr Ernest Mallick -> Dr Chase Caller  10/22/2019 -   Chief Complaint  Patient presents with   Follow-up    Pt being referred to Dr. Chase Caller by Dr. Carlis Abbott for ILD.  Pt states he has been doing good since last visit and states that he feels SOB is stable. Denies any complaints of cough or chest discomfort.  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  - in 2019 : considered progressive pre-bx (on PFT/CT per notes)  - in 2019 - clinical CTD ruled out by Dr. Esmeralda Arthur record review_  -High-resolution CT March 2021: Indeterminate for UIP without change since 2017  RX  -  Mid-endJan 2020 -s tarted cellcept/bactrim + ofev (course complicated early February 2020 by diverticulitis and intermittent side effects with stop start of the nintedanib)  Pathological tissue mild emphysema present on surgical lung biopsy October 2019 [not evident on pulmonary function testing]  - Alpha 1 - MM:  Oct 2019  -On inhaler therapy  -No obstruction seen on PFT or emphysema on CT   History of perforated sigmoid diverticulitis early February 2021 few weeks after starting nintedanib/CellCept  -Status post reversal of colostomy in September 2020   HPI Pranit Owensby Rodarte 77 y.o. -presents to the ILD center with the above history.  Is a transfer of care to the ILD center.  Recently his case was discussed in June 2021 with interstitial lung disease multidisciplinary conference.  His pathology though showed fibroblastic foci did have then reformed branching fibrosis.  There was ossification.  There is no honeycombing.  There was heterogeneity and fibroblastic foci suggestive of UIP.  Overall it was felt that he had unclassifiable ILD.  The radiologist felt his CT scan had not progressed between 2018 and 2021.  His pulmonary function test were variable.  Therefore it was recommended that he be seen at the ILD center.  His ILD diagnosis as above.  He tells me though that when he started the nintedanib he started feeling better.  Although at the same time or before that he was also committed to inhaler therapy based on pathological emphysema on his lung biopsy from 2019.  This is also helped.  Therefore he is a concomitant diagnosis COPD/emphysema.  He is currently here to discuss his future care options.  He tells me  that he has continued CellCept and Bactrim for his ILD since early 2020 except for interruptions due to surgery.  He has been off nintedanib but recently has rechallenged himself at 150 mg once daily for the last 1 month and he is tolerating it fine.  His rheumatologist is working down on his prednisone to 5 mg/day [this is for polymyalgia rheumatica].  He tells me he has upcoming spinal surgery to the C-spine and later to the L-spine.  He wants clearance for this.  Noted he is on anticoagulation with Eliquis and has had bleeding episode.  [facility] Comprehensive ILD Questionnaire  Symptoms:    Overall he is reporting progressive dyspnea although the conference it was felt radiology did not change over time.  Is been going on insidious onset for the last 3 years.  Dyspnea present on exertion relieved by rest.     Past Medical History : As above   ROS: Positive for fatigue and diffuse arthralgia.  No dysphagia.  No dry eyes.  No Raynaud's no weight loss.  No vomiting.  No rash   FAMILY HISTORY of LUNG DISEASE:  -Father had asthma.  No COPD no sarcoid no cystic fibrosis no hypersensitive pneumonitis no autoimmune disease.  EXPOSURE HISTORY: Briefly smoked cigarettes for 3 years.  Otherwise never smoked  Cigarettes.  He did smoke marijuana very little in 1972 and never use cocaine.  No intravenous drug use     HOME and HOBBY DETAILS : [place].  Age of the home is 79 years.  He has lived in the home for 30 years.  No dampness no mildew no mold no humidifier use no CPAP use no nebulizer use.  No steam iron use.  No Jacuzzi use.  There is a misting Fountain in the house because his wife likes it.  But there is no mold or mildew.  No pet gerbils.  No feather pillows.  No mold in the [facility] duct.  No music habits no guarding habits.  No flood of water damage.  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  In the past he is done woodwork, Brewing technologist work, Biomedical engineer.  He has been exposed to asbestos woodwork and furniture work.  During furniture work he got exposed to meth saline chloride.  He also is a [place] (27 items): CellCept, prednisone and nintedanib       ROS - per HPI  Results for MALIKIAH, DEBARR SILER "SI" (MRN [id number]) as of 10/22/2019 16:07  Ref. Range 05/29/2016 10:05 10/10/2016 13:54 01/14/2018 09:50 01/14/2018 09:50 03/12/2018 11:24 6/10  Aspergillus flavus Unknown REPORT       Aspergillus Fumigatus Latest Ref Range: NEGATIVE     NEGATIVE    A fumigatus #1 Unknown REPORT       Aspergillus [place] Unknown REPORT       [place]on Serum Latest Ref Range: NEGATIVE  REPORT   NEGATIVE    Apullulans Unknown REPORT       A-1 Antitrypsin, Ser Latest Ref Range: 83 - 199 mg/dL     125   Anti Nuclear Antibody (ANA) Latest Ref Range: NEGATIVE  NEG   NEGATIVE    CENTROMERE AB SCREEN Latest Ref Range: <1.0 NEG AI <1.0 NEG  <8.2 NEG     Cyclic Citrullin Peptide Ab Latest Units: UNITS <16   <16  < 16  RA Latex Turbid. Latest Ref Range: <14 IU/mL 24 (H)   38 (H)  32  B burgdorferi Ab IgG+IgM Latest Ref Range: <0.90 Index  <0.90      SSA (Ro) (ENA) Antibody, IgG Latest Ref Range: <1.0 NEG AI <1.0 NEG   <1.0 NEG    SSB (La) (ENA) Antibody, IgG Latest Ref Range: <1.0 NEG AI <1.0 NEG   <1.0 NEG    Scleroderma (Scl-70) (ENA) Antibody, IgG Latest Ref Range: <1.0 NEG AI <1.0 NEG  <1.0 NEG <1.0 NEG      IMPRESSION: COMPARISON:  CT chest, 12/06/2017, 06/13/2016, 04/02/2016   FINDINGS: Cardiovascular: Aortic atherosclerosis. Normal heart size. No pericardial effusion.   Mediastinum/Nodes: No enlarged mediastinal, hilar, or axillary lymph nodes. Thyroid gland, trachea, and esophagus demonstrate no significant findings.   Lungs/Pleura: Wedge resections of the right  lung. Redemonstrated mild pulmonary fibrosis with a slight apical to basal gradient featuring tubular bronchiectasis in the lower lobes, peripheral irregular interstitial pulmonary opacity, and no evidence of bronchiolectasis or honeycombing. Fibrotic findings are not significantly changed compared to prior examinations. No significant air trapping on expiratory phase examination. No pleural effusion or pneumothorax.  1. No significant change in mild pulmonary fibrosis with a slight apical to basal gradient featuring tubular bronchiectasis in the lower lobes, peripheral irregular interstitial pulmonary opacity, and no evidence of bronchiolectasis or honeycombing. No significant air trapping on expiratory phase examination. Findings remain consistent with "indeterminate for UIP" pattern fibrosis by ATS pulmonary fibrosis criteria. Findings are indeterminate for UIP per consensus guidelines: Diagnosis of Idiopathic Pulmonary Fibrosis: An Official ATS/ERS/JRS/ALAT Clinical Practice Guideline. [place], Iss 5, [phone], Jan 12 2017.   2.  Interval wedge biopsies of the right lung.  3.  Aortic Atherosclerosis (ICD10-I70.0).     Electronically Signed   By: Eddie Candle M.D.   On: 07/30/2019 16:33     OV 12/30/2019  Subjective:  Patient ID: Bjorn Pippin, male , DOB: 06-10-1945 , age 89 y.o. , MRN: [id number] , ADDRESS: [address] [address]   12/30/2019 -   Chief Complaint  Patient presents with   Follow-up    reports feeling "about the same" since last visit. Last albuterol use was this past weekend   Follow-up unclassifiable interstitial lung disease with trace positive rheumatoid factor  HPI Braelynn Lupton Compean 77 y.o. -presents for follow-up. Since his last visit we recheck his autoimmune panel. His rheumatoid factor is only trace positive. He had pulmonary function test today and is documented below the stable. His spring  2021 CT chest is stable. Overall stability for few years. He again tells me that he is much better than he was 18 months ago in terms of shortness of breath. Symptom scores document that. At this point in time he is off CellCept. He thinks his improvement is related to starting nintedanib. His wife is here with him and I am meeting her for the first time. She did acknowledge that this potential that he is feeling better because back then when he had the PVCs of ILD diagnosed he was not in a good frame of mind. Because he was dealing with back and neck issues. This is because his pre and post nintedanib PFT shows stability. Since his last visit he is off CellCept. He has had a C-spine surgery. His back surgery has been postponed. However he is dealing with significant diarrhea. The diarrhea is despite stopping CellCept. This is because of nintedanib. He currently has dropped his nintedanib to 100 mg once daily but despite that is still having diarrhea. He does take some coffee in the morning with sugar but despite that has diarrhea. He prefers that he stop the nintedanib but in the past he acknowledges that it helped him.      OV 07/06/2020  Subjective:  Patient ID: Bjorn Pippin, male , DOB: 1946/05/14 , age 34 y.o. , MRN: [id number] , ADDRESS: [place] [address] PCP [name], MD Patient Care Team: [name], MD as PCP - General (Internal Medicine) Troy Sine, MD as PCP - Cardiology (Cardiology)  This Provider for this visit: Treatment Team:  Attending Provider: Brand Males, MD    07/06/2020 -   Chief Complaint  Patient presents with   Follow-up    PFT performed today.  Pt states he feels like he might have declined some since last visit. Pt states his breathing is some worse. Denies any complaints of cough.      HPI Javonta Gronau Locust 77 y.o. -returns for follow-up for his ILD.  He presents with his wife.  He is on supportive care.  He quit  taking his CellCept and nintedanib in the summer 2021 following diarrhea in the setting of GI surgery and colostomy reversal.  He does not want to take these drugs anymore.  However he tells me that he is feeling more short of breath in the last 4 months.  Present on exertion relieved by rest.  His symptom score reflect worsening shortness of breath.  He continues to have some amount of diarrhea because of reversal of his colostomy.  He had a high-resolution CT chest that shows stability in ILD per the  radiologist since 2017.  His walking desaturation test is stable.  However his pulmonary function test shows confusing signal compared to 6 months or 1 year ago.  Is stable versus normal variation versus slight decline.  I personally visualized the images of the pulmonary function test and the grph with him.  He is open to getting his pulmonary pathology slides sent for third opinion at an outside institution.  We discussed antifibrotic's in case his fibrosis is getting worse.  He is definitely not interested in the other option of pirfenidone after hearing side effects of nausea, vomiting, weight loss, fatigue and possible diarrhea.  He is interested in drugs that may not have GI side effects.  These are available in clinical trials.  For which she will need an IPF diagnosis.  Therefore is interested in getting another opinion on the slides to see if you would qualify for trials in the future.  His wife does indicate that he did have an episode of chest pain many went outdoors a few months ago and was climbing a hill.  In fact the chest pain was so bad it was central that he had to come back.  They have not reported this to the cardiologist.  He has cardiology follow-up coming up with Dr. Claiborne Billings in May 2022.  He had normal stress test or low risk in 2017.  His echo was normal in May 2021.       IMPRESSION: HRCT  Feb 2022  COMPARISON:  07/30/2019, 04/02/2016   FINDINGS: Cardiovascular: Aortic  atherosclerosis. Normal heart size. No pericardial effusion.   Mediastinum/Nodes: No enlarged mediastinal, hilar, or axillary lymph nodes. Thyroid gland, trachea, and esophagus demonstrate no significant findings.   Lungs/Pleura: Evidence of prior right lung wedge resections. No significant interval change in pattern of mild pulmonary fibrosis featuring irregular peripheral interstitial opacity and mild, tubular bronchiectasis at the lung basis without evidence of significant subpleural bronchiolectasis or honeycombing. No significant air trapping on expiratory phase imaging. No pleural effusion or pneumothorax.    1. No significant interval change in pattern of mild pulmonary fibrosis featuring irregular peripheral interstitial opacity and mild, tubular bronchiectasis at the lung basis without evidence of significant subpleural bronchiolectasis or honeycombing. Findings remain consistent with an "indeterminate for UIP" pattern of fibrosis by pulmonary fibrosis criteria. Findings are indeterminate for UIP per consensus guidelines: Diagnosis of Idiopathic Pulmonary Fibrosis: An Official ATS/ERS/JRS/ALAT Clinical Practice Guideline. [place], Iss 5, [phone], Jan 12 2017. 2. Evidence of prior right lung wedge resections.   Aortic Atherosclerosis (ICD10-I70.0).    According to the radiologist the pulmonary fibrosis is stable between 2017 through February 2022 on CT scan of the chest   Plan -We discussed about starting the other antifibrotic pirfenidone but given your history of GI side effects with nintedanib I respect your desire to refuse but continue with expectant follow-up  -We will send a message to Dr. Shelva Majestic to evaluate you for angina  -Once Dr. Claiborne Billings clears you I think you should join pulmonary rehabilitation  -I have sent a note to our pathologist to get another opinion from another institution on your lung pathology to see if you have  undifferentiated connective tissue disease or UIP/IPF.  If you have UIP/IPF you might qualify for clinical trials with drugs that do not have GI side effects  Follow-up -Do spirometry and DLCO in 3 months =     OV 02/09/2021  Subjective:  Patient ID: Janna Arch Sabic,  male , DOB: Jul 12, 1945 , age 70 y.o. , MRN: [id number] , ADDRESS: [place] [id number] PCP [name], MD Patient Care Team: [name], MD as PCP - General (Internal Medicine) Troy Sine, MD as PCP - Cardiology (Cardiology)  This Provider for this visit: Treatment Team:  Attending Provider: Brand Males, MD    02/09/2021 -   Chief Complaint  Patient presents with   Follow-up    PFT performed today.  Pt states he recently had back surgery 12/22/20. States his breathing has been about the same.   2  HPI Enoc Getter Marbach 77 y.o. -follow-up unclassifiable ILD.  Last seen first in February 2022.  After that in July 2022 he saw a nurse practitioner for a preop clearance.  Since then he has had back fusion surgery.  He is slowly recovering from that.  He is here with his wife [name].  Dyspnea score is stable.  He is not interested in antifibrotic's given previous side effects.  Last visit in February 2022 I contacted pathology to get another opinion from the [place] clinic.  This is because the 2019 pathology revealed grade at [facility] said unclassifiable ILD but also indicated that there might be UIP.  I was not sure.  Still waiting on that interpretation in clinic.  I followed up with pathology about it nevertheless he is doing stable.  He will have a high-dose flu shot today.  He has never had COVID and wanted to know if he should get the COVID by Valent mRNA booster.  I replied in the affirmative.  Currently is dealing a lot with back issues and slowly getting more mobile.  He and his wife wanting to travel to [place] to visit family.  They asked about travel advice in the era of  COVID.     OV 10/31/2021  Subjective:  Patient ID: Bjorn Pippin, male , DOB: [birth year] , age 49 y.o. , MRN: [id number] , ADDRESS: [place] [id number] PCP [name], MD Patient Care Team: [name], MD as PCP - General (Internal Medicine) Troy Sine, MD as PCP - Cardiology (Cardiology)  This Provider for this visit: Treatment Team:  Attending Provider: Brand Males, MD     10/31/2021 -   Chief Complaint  Patient presents with   Follow-up    Pt states that his breathing has become worse since last visit. Denies any complaints of cough.     HPI Derrion Tritz Janczak 78 y.o. -returns for follow-up.  Last seen in September 2022.  He states our office never called back to make his 67-monthfollow-up.  He then had to call because he is now having insidious onset of worsening shortness of breath.  In December 2022 after his organ trip he picked up a respiratory viral infection with persistent cough.  Since then he has been more short of breath.  His symptom score is only slightly worse.  His walking desaturation test is stable but his pulmonary function test shows a significant decline in FVC and DLCO.  Last CT scan of the chest was a year ago.  Last echo was a year ago.  He is worried about his decline.  In the past he has been intolerant to nintedanib [facility].  Explained to him IPF versus non-IPF.  Explained to him progressive phenotype and by unclassifiable ILD if it sent.   01/11/2022 Follow  up : ILD  Patient returns 56-monthfollow-up.  Patient has underlying interstitial lung disease.  Last visit patient was started on Esbriet.  Patient says he tried 3 separate occasions to take Esbriet but was unable to tolerate due to severe GI symptoms and diarrhea.  He was set up for high-resolution CT chest that was done on January 09, 2022 that showed no significant interval change in his mild  pulmonary fibrosis with a pattern apical to basilar gradient. 2D echo showed preserved EF, grade 1 diastolic function, right ventricular systolic function normal.  RV size is normal.  Labs were unrevealing.  BNP was normal. Patient says overall he is doing okay.  He tries to stay active with light chores around the house.  Patient denies any flare of cough or wheezing.  Has had no decrease in activity tolerance. Patient has PFTs planned for next month. Patient has tried Ofev in the past and was unable to tolerate due GI side effects. Patient says since last visit he is not worse or better he is about the same.  He gets winded with heavy activities.    OV 02/01/2022  Subjective:  Patient ID: VBjorn Pippin male , DOB: 4February 16, 1947, age 89y.o. , MRN: [id number], [place] [address] [id number] HCelene Squibb MD Patient Care Team: HCelene Squibb MD as PCP - General (Internal Medicine) KTroy Sine MD as PCP - Cardiology (Cardiology)  This Provider for this visit: Treatment Team:  Attending Provider: RBrand Males MD   L 02/01/2022 -   Chief Complaint  Patient presents with   Follow-up    PFT performed today. Pt states he is still having problems with SOB.     HPI VSeneca HobackRothrock 77y.o. -returns for follow-up.  Has gained a few more pounds of weight.  He did try pirfenidone but this caused him nausea and diarrhea and then in August 2020 he had COVID he possibly pirfenidone.  Once he recovered from [place] it again it made him sick again to his stomach.  Therefore he stopped it.  He has no interest in doing antifibrotic's again.  He had repeat pulmonary function test and this shows that the pulmonary function test is slightly better but overall 2023 the pulmonary function test is worse compared to 2022 and before.  Nevertheless symptom wise he is stable.  We did do a high-res CT and shows continued stability of the ILD for many years.   Therefore he might not have progressive phenotype.  Even if he does at this point in time he is not tolerating any of the approved antifibrotic's.  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  We discussed the ILD-Pro registry and if he qualifies he said he would be interested in it but his symptom scores remained stable and the CT scan has been stable.  Therefore he might not qualify.  He is willing to have the high-dose flu shot today.  He does not want have the COVID-vaccine because he believes he has natural immunity.  We did discuss RSV vaccine.        OV 06/05/2022  Subjective:  Patient ID: VBjorn Pippin male , DOB: [id number], age 77y.o. 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   - May clinic 3rd opinion 2022:  it is not IPF but they along with [facility] -place it as unclassfiable ILD.  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  - in 2019 : considered progressive pre-bx (on PFT/CT per notes)  - in 2019 - clinical CTD ruled out by Dr. 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 Surgery is pending. Right shoulder surgery. Pt did spiro/dlco today before visit. No pulmonary medications noted from patient      HPI Havier Deeb Hoe 77 y.o. -presents for follow-up.  Presents with his wife.  They plan to go back to [place] again in August 2024.  Wife is giving independent history here.  They both confirm that he is stable.  He feels the same in terms of his shortness of breath.  He has non-- IPF phenotype and he is on supportive care after having failed both pirfenidone and nintedanib because of side effects.  Even though he feels stable at this time he gave a high at symptom score for shortness of breath than in the past.  He had pulmonary function test that is definitely stable today January 2024 compared to September 2023 in June 2023.  However I believe this is consistent and definite transition point somewhere between late 2022 in the summer 2023.  The numbers are  definitely worse than late 2022 and also worse compared to 2021.  I did show these numbers to him.  Did indicate to him that while CT scan of the chest last summer 2023 is stable compared to 2022 and his symptoms are relatively stable things could be changing and he might be having progressive phenotype.  Did indicate to him currently antifibrotic's are not an option because of intolerance.  I believe he is  also on CellCept in the past. Neck step is to consider clinical trials.  We discussed clinical trials and he is open to the idea of inhaled treprostinil inhaled pirfenidone assuming we will be able to offer this  Vaccination: He has not had his RSV vaccine.  I did indicate to him the need for this.  He had COVID in August 2023.  Indicated 6 months of natural immunity and to consider vaccine after February 2024  Preoperative respiratory exam: He is having right shoulder surgery and.  It is not scheduled it.  The doctors Dr. Ophelia Charter.  A 45-minute surgery possibly under general anesthesia.  Currently functional.  I think he is low-moderate risk.      SYMPTOM SCALE - ILD 10/22/2019  12/30/2019  07/06/2020  02/09/2021 Supporitive care. S/p back surgery. 183# 10/31/2021  -> start esbriet 02/01/2022 -> off esbriet due to side effect 06/05/2022 r  O2 use ra ra ra ra a ra ra  Shortness of Breath 0 -> 5 scale with 5 being worst (score 6 If unable to do)        At rest 1 1 0 '1 1 1 1  '$ Simple tasks - showers, clothes change, eating, shaving '3 1 2 1 1 1 2  '$ Household (dishes, doing bed, laundry) '3 2 2 1 2 1 3  '$ Shopping '3 1 2 1 2 2 3  '$ Walking level at own pace '4 2 3 3 2 3 2  '$ Walking up Stairs '5 4 5 5 5 5 5  '$ Total (30-36) Dyspnea Score '19 11 13 12 14 13 17  '$ How bad is your cough? 0 0 0 0 0 0 0  How bad is your fatigue yes '3 2 2 2 1 2  '$ How bad is nausea yes 3 ofev '1 1 1 1 1  '$ How bad is vomiting?  n 0 0 0 0 0 0  How bad is diarrhea? no 3 ofev 1 - baseline from GI surgery 0 00 2 3  How bad is anxiety? x  1 0 1 0 1 1  How bad is depression x 1 0 1 0 1 1     Simple office walk 185 feet x  3 laps goal with forehead probe 10/22/2019  07/06/2020  10/31/2021  02/01/2022   O2 used ra ra ra ra  Number laps completed '3 3 3 3  '$ Comments about pace avg avg    Resting Pulse Ox/HR 98% and 90/min 98% and HR 78 99% and HR 91 98% and HR 68  Final Pulse Ox/HR 96% and 107/min 97% and HR 95 96% and HR 108 97% and HR 84  Desaturated </= 88% no  nni   Desaturated <= 3% points no  no   Got Tachycardic >/= 90/min yes  Yes, 3 points No 1 point  Symptoms at end of test Mild dyspnea Mild dy spnea Mild dyspnea Mild duyspnea  Miscellaneous comments x       Modified Six Minute Walk - 06/05/22 1100     Type of O2 used  Room Air    Number of laps completed  3    Lap Pace Slow    Resting Heartrate 84 bpm    Final Heartrate 98 bpm    Resting Pulse Ox 98 %    Desaturated to <= 3 points No    Desaturated to < 88% No    Became tachycardic No    Symptoms  patient states no symptoms while walking at  a slow and steady pace    Was the O2 correction test done? No    comments lap1:97%/94, lap2:97%/97,lap3: 97%/98 final: 98%/94              PFT     Latest Ref Rng & Units 06/05/2022   10:05 AM 02/01/2022    8:57 AM 10/20/2021    4:09 PM 02/09/2021    1:43 PM 07/06/2020    8:47 AM 12/30/2019   10:15 AM 07/15/2019    8:59 AM  PFT Results  FVC-Pre L 3.04  P 3.18  2.64  3.61  3.50  3.56  3.58   FVC-Predicted Pre % 76  P 80  66  90  86  88  88   FVC-Post L     3.46   3.60   FVC-Predicted Post %     85   88   Pre FEV1/FVC % % 85  P 82  88  83  81  82  76   Post FEV1/FCV % %     84   79   FEV1-Pre L 2.58  P 2.62  2.31  2.98  2.82  2.93  2.72   FEV1-Predicted Pre % 90  P 91  80  103  96  99  91   FEV1-Post L     2.89   2.85   DLCO uncorrected ml/min/mmHg 15.96  P 14.70  14.53  20.68  19.11  20.72  19.51   DLCO UNC% % 67  P 61  61  86  79  86  80   DLCO corrected ml/min/mmHg 15.96  P 14.70  14.53  22.11  19.11  20.55   19.51   DLCO COR %Predicted % 67  P 61  61  92  79  85  80   DLVA Predicted % 87  P 81  82  100  90  92  90   TLC L     5.29   5.86   TLC % Predicted %     79   88   RV % Predicted %     71   94     P Preliminary result       has a past medical history of Allergy to alpha-gal, Arthritis, Chest pain, Complication of anesthesia, Difficult intubation (02/19/2018), Digestive problems, Dyspnea, Dysrhythmia, GERD (gastroesophageal reflux disease), H/O viral myocarditis, Head injury, closed, with concussion, Hyperlipidemia, Mild mitral valve prolapse, PAF (paroxysmal atrial fibrillation) (HCC), Polymyalgia rheumatica (HCC), Pre-diabetes, Pulmonary fibrosis ([place]), and Sleep apnea.   reports that he has never smoked. He has never used smokeless tobacco.  Past Surgical History:  Procedure Laterality Date   ANTERIOR CERVICAL DECOMP/DISCECTOMY FUSION N/A 11/10/2019   Procedure: Cervical Three-Four Anterior cervical decompression/discectomy/fusion;  Surgeon: [name], MD;  Location: [place];  Service: Neurosurgery;  Laterality: N/A;  Cervical Three-Four Anterior cervical decompression/discectomy/fusion   apendectomy  1965   APPENDECTOMY     APPLICATION OF ROBOTIC ASSISTANCE FOR SPINAL PROCEDURE N/A 12/22/2020   Procedure: APPLICATION OF ROBOTIC ASSISTANCE FOR SPINAL PROCEDURE;  Surgeon: [name], MD;  Location: [place];  Service: Neurosurgery;  Laterality: N/A;   BACK SURGERY     bone spur Bilateral 1999   feet   CARDIAC CATHETERIZATION  2001   CHOLECYSTECTOMY  2004   COLON RESECTION N/A 06/15/2018   Procedure: SIGMOID COLON RESECTION;  Surgeon: [name], MD;  Location: [facility];  Service: General;  Laterality: N/A;   COLONOSCOPY  COLOSTOMY N/A 06/15/2018   Procedure: COLOSTOMY;  Surgeon: [name], MD;  Location: [facility];  Service: General;  Laterality: N/A;   COLOSTOMY TAKEDOWN N/A 01/26/2019   Procedure: LAPAROSCOPIC ASSISTED COLOSTOMY TAKEDOWN;  Surgeon: [name], MD;   Location: [place];  Service: General;  Laterality: N/A;   EYE SURGERY Bilateral    cataract removal   LAPAROSCOPIC LYSIS OF ADHESIONS N/A 01/26/2019   Procedure: LAPAROSCOPIC LYSIS OF ADHESIONS;  Surgeon: [name], MD;  Location: [place];  Service: General;  Laterality: N/A;   LAPAROTOMY N/A 06/15/2018   Procedure: EXPLORATORY LAPAROTOMY;  Surgeon: [name], MD;  Location: [facility];  Service: General;  Laterality: N/A;   LUMBAR LAMINECTOMY/ DECOMPRESSION WITH MET-RX Right 05/05/2013   Procedure: Right Lumbar three-four Extraforaminal Microdiskectomy with Metrex;  Surgeon: [name], MD;  [facility];  Service: Neurosurgery;  Laterality: Right;  Right Lumbar three-four Extraforaminal Microdiskectomy with Metrex   LUNG BIOPSY Right 02/19/2018   Procedure: LUNG BIOPSY;  Surgeon: [name], MD;  Location: [place];  Service: Thoracic;  Laterality: Right;   POSTERIOR LUMBAR FUSION 4 LEVEL N/A 12/22/2020   Procedure: Lumbar one-two Posterior lumbar interbody fusion with stabilization from Thoracic ten to Lumbar one with robotic screw placement;  Surgeon: [name], MD;  Location: [place];  Service: Neurosurgery;  Laterality: N/A;  RM 20   SHOULDER OPEN ROTATOR CUFF REPAIR Bilateral 2001   TONSILLECTOMY     VIDEO ASSISTED THORACOSCOPY Right 02/19/2018   Procedure: VIDEO ASSISTED THORACOSCOPY;  Surgeon: [name], MD;  Location: [place];  Service: Thoracic;  Laterality: Right;    Allergies  Allergen Reactions   Xylocaine [Lidocaine] Anaphylaxis    Injection form   Codeine Nausea And Vomiting   Statins Other (See Comments)    Muscle soreness and an aching feeling all over Myalgias   Aldactone [Spironolactone]     Sore nipples and chest   Alpha-Gal     2015; has been able to re-introduce red meat for the past 2 1/2 years without reaction (as of 01/21/19)   Pirfenidone Other (See Comments)    GI side effects   Percocet [Oxycodone-Acetaminophen] Itching     Immunization History  Administered Date(s) Administered   Fluad Quad(high Dose 65+) 02/09/2021, 02/01/2022   Influenza Split 03/20/2016   Influenza, High Dose Seasonal PF 01/29/2018, 02/25/2019, 03/20/2019, 12/13/2019   Influenza,inj,Quad PF,6+ Mos 03/12/2017   Moderna Sars-Covid-2 Vaccination 06/04/2019, 07/10/2019, 03/18/2020   Pneumococcal Conjugate-13 01/10/2018    Family History  Problem Relation Age of Onset   Rheum arthritis Mother    Thyroid cancer Mother    Heart disease Father    Asthma Father    Thyroid cancer Sister    Melanoma Brother      Current Outpatient Medications:    acetaminophen (TYLENOL 8 HOUR) 650 MG CR tablet, Take 1 tablet (650 mg total) by mouth every 8 (eight) hours as needed for pain., Disp: 30 tablet, Rfl: 0   Capsaicin (CAPSAICIN TOPICAL PAIN PATCH) 0.025 % PTCH, Apply 1 patch topically every 12 (twelve) hours., Disp: 10 patch, Rfl: 1   cetirizine (ZYRTEC) 10 MG tablet, Take 10 mg by mouth daily as needed for allergies. , Disp: , Rfl:    Cholecalciferol (DIALYVITE VITAMIN D 5000) 125 MCG (5000 UT) capsule, Take 5,000 Units by mouth daily., Disp: , Rfl:    diltiazem (CARDIZEM CD) 120 MG 24 hr capsule, TAKE ONE CAPSULE BY MOUTH DAILY, Disp: 90 capsule,  Rfl: 0   diphenoxylate-atropine (LOMOTIL) 2.5-0.025 MG tablet, Take 1 tablet by mouth 4 (four) times daily as needed for diarrhea or loose stools., Disp: , Rfl:    ELIQUIS 5 MG TABS tablet, TAKE ONE TABLET ('5MG'$  TOTAL) BY MOUTH TWODAILY (Patient taking differently: Take 5 mg by mouth 2 (two) times daily.), Disp: 180 tablet, Rfl: 1   ezetimibe (ZETIA) 10 MG tablet, TAKE ONE (1) TABLET BY MOUTH EVERY DAY, Disp: 90 tablet, Rfl: 3   fluticasone (FLONASE) 50 MCG/ACT nasal spray, Place 2 sprays into both nostrils daily as needed for allergies or rhinitis., Disp: , Rfl:    gabapentin (NEURONTIN) 300 MG capsule, Take 300 mg by mouth 2 (two) times daily., Disp: , Rfl:    hydroxychloroquine (PLAQUENIL) 200 MG  tablet, Take 200 mg by mouth 2 (two) times daily., Disp: , Rfl:    metoprolol tartrate (LOPRESSOR) 25 MG tablet, TAKE ONE TABLET BY MOUTH TWICE A DAY (Patient taking differently: Take 25 mg by mouth 2 (two) times daily.), Disp: 180 tablet, Rfl: 3   ondansetron (ZOFRAN) 4 MG tablet, Take 4 mg by mouth every 8 (eight) hours as needed for nausea or vomiting. , Disp: , Rfl:    predniSONE (DELTASONE) 5 MG tablet, Take 5 mg by mouth daily. , Disp: , Rfl:    rosuvastatin (CRESTOR) 10 MG tablet, TAKE ONE-HALF TABLET ('5MG'$  TOTAL) BY MOUTH ONCE A WEEK, Disp: 20 tablet, Rfl: 2   tamsulosin (FLOMAX) 0.4 MG CAPS capsule, Take 0.4 mg by mouth 2 (two) times daily. , Disp: , Rfl:    Trolamine Salicylate (ASPERCREME EX), Apply 1 application topically daily as needed (arthritis pain)., Disp: , Rfl:       Objective:   Vitals:   06/05/22 1029  BP: 124/68  Pulse: 83  Temp: 98.6 F (37 C)  TempSrc: Oral  SpO2: 96%  Weight: 187 lb (84.8 kg)  Height: '5\' 8"'$  (1.727 m)    Estimated body mass index is 28.43 kg/m as calculated from the following:   Height as of this encounter: '5\' 8"'$  (1.727 m).   Weight as of this encounter: 187 lb (84.8 kg).  '@WEIGHTCHANGE'$ @  [employer]   06/05/22 1029  Weight: 187 lb (84.8 kg)     Physical Exam    General: No distress. Looks well Neuro: Alert and Oriented x 3. GCS 15. Speech normal Psych: Pleasant Resp:  Barrel Chest - no.  Wheeze - no, Crackles - yes, No overt respiratory distress CVS: Normal heart sounds. Murmurs - no Ext: Stigmata of Connective Tissue Disease - no HEENT: Normal upper airway. PEERL +. No post nasal drip     1) RISK FOR PROLONGED MECHANICAL VENTILAION - > 48h  1A) Arozullah - Prolonged mech ventilation risk Arozullah Postperative Pulmonary Risk Score - for mech ventilation dependence >48h [employer], Ann Surg 2000, major non-cardiac surgery) Comment Score  Type of surgery - abd ao aneurysm (27), thoracic (21), neurosurgery / upper  abdominal / vascular (21), neck (11) Rigt shoulder 0  Emergency Surgery - (11) elective 0  ALbumin < 3 or poor nutritional state - (9) no 0  BUN > 30 -  (8) no 0  Partial or completely dependent functional status - (7) function 0  COPD -  (6) ILD 6  Age - 60 to 76 (4), > 70  (6) Age 33 6  TOTAL  12  Risk Stratifcation scores  - < 10 (0.5%), 11-19 (1.8%), 20-27 (4.2%), 28-40 (10.1%), >40 (26.6%)  Low -  modeate       Assessment:       ICD-10-CM   1. ILD (interstitial lung disease) ([place])  J84.9     2. Vaccine counseling  Z71.85     3. Pre-operative respiratory examination  Z01.811          Plan:     Patient Instructions  ILD (interstitial lung disease) ([place]) Dyspnea on exertion   -Clinically  you have NON-IPF variety of fibrosis - - CT scan stable 2022 -> 2023  - PFT shows drop in June 2023 over 2022  and 2021 but stabile June 2023 -> Jan 2024  -You might be slowly progressing but currently getting conflicting signals - You are currently on supportive care due to pirfenidone and nintedanib and CellCept intolerance   Plan - spirmetry and dlco in 4-6 months; I definitely do it in June or July 2024 before you go to [place] -Consider clinical trial participation in the future with either inhaled pirfenidone or inhaled treprostinil  . Vaccine counseling Flu vaccine need  -Consider COVID-vaccine after February 2024 - RSV vaccine-definitely consider this before right shoulder surgery  Preoperative pulmonary evaluation for right shoulder surgery  Low-moderate risk for pulmonary complications from right shoulder surgery  Plan - Okay to proceed with right shoulder surgery but required anesthesia support    Follow-up - -Do spirometry and DLCO in 4-6 months -Return to see Dr. Chase Caller 4-6 months or sooner if needed - 30 min visit  - symtoms score and walk test at followup  ( Level 05 visit: Estb 40-54 min  in  visit type: on-site physical face to visit  in total care  time and counseling or/and coordination of care by this undersigned MD - Dr Brand Males. This includes one or more of the following on this same day 06/05/2022: pre-charting, chart review, note writing, documentation discussion of test results, diagnostic or treatment recommendations, prognosis, risks and benefits of management options, instructions, education, compliance or risk-factor reduction. It excludes time spent by the [place] or office staff in the care of the patient. Actual time 69 min)   SIGNATURE    Dr. Brand Males, M.D., F.C.C.P,  Pulmonary and Critical Care Medicine Staff Physician, [place] Director - Interstitial Lung Disease  Program  Pulmonary [place] [facility] [place], [place], [address]  Pager: [phone], If no answer or between  15:00h - 7:00h: call [phone] Telephone: [phone]  11:16 AM 06/05/2022

## 2022-06-05 NOTE — Patient Instructions (Addendum)
ILD (interstitial lung disease) ([place]) Dyspnea on exertion   -Clinically  you have NON-IPF variety of fibrosis - - CT scan stable 2022 -> 2023  - PFT shows drop in June 2023 over 2022  and 2021 but stabile June 2023 -> Jan 2024  -You might be slowly progressing but currently getting conflicting signals - You are currently on supportive care due to pirfenidone and nintedanib and CellCept intolerance   Plan - spirmetry and dlco in 4-6 months; I definitely do it in June or July 2024 before you go to [place] -Consider clinical trial participation in the future with either inhaled pirfenidone or inhaled treprostinil  . Vaccine counseling Flu vaccine need  -Consider COVID-vaccine after February 2024 - RSV vaccine-definitely consider this before right shoulder surgery  Preoperative pulmonary evaluation for right shoulder surgery  Low-moderate risk for pulmonary complications from right shoulder surgery  Plan - Okay to proceed with right shoulder surgery but required anesthesia support    Follow-up - -Do spirometry and DLCO in 4-6 months -Return to see Dr. Chase Caller 4-6 months or sooner if needed - 30 min visit  - symtoms score and walk test at followup

## 2022-06-05 NOTE — Progress Notes (Signed)
Spirometry/Diffusion Capacity performed today.   

## 2022-06-07 ENCOUNTER — Other Ambulatory Visit: Payer: Self-pay | Admitting: Cardiovascular Disease

## 2022-06-11 DIAGNOSIS — E785 Hyperlipidemia, unspecified: Secondary | ICD-10-CM | POA: Diagnosis not present

## 2022-06-11 DIAGNOSIS — E1169 Type 2 diabetes mellitus with other specified complication: Secondary | ICD-10-CM | POA: Diagnosis not present

## 2022-06-13 ENCOUNTER — Ambulatory Visit
Admission: RE | Admit: 2022-06-13 | Discharge: 2022-06-13 | Disposition: A | Discharge status: Home / Self Care | Payer: PPO | Source: Ambulatory Visit | Attending: Orthopaedic Surgery | Admitting: Orthopaedic Surgery

## 2022-06-13 DIAGNOSIS — M25511 Pain in right shoulder: Secondary | ICD-10-CM | POA: Diagnosis not present

## 2022-06-13 DIAGNOSIS — Z01818 Encounter for other preprocedural examination: Secondary | ICD-10-CM

## 2022-06-15 DIAGNOSIS — I480 Paroxysmal atrial fibrillation: Secondary | ICD-10-CM | POA: Diagnosis not present

## 2022-06-15 DIAGNOSIS — K5900 Constipation, unspecified: Secondary | ICD-10-CM | POA: Diagnosis not present

## 2022-06-15 DIAGNOSIS — I2510 Atherosclerotic heart disease of native coronary artery without angina pectoris: Secondary | ICD-10-CM | POA: Diagnosis not present

## 2022-06-15 DIAGNOSIS — E785 Hyperlipidemia, unspecified: Secondary | ICD-10-CM | POA: Diagnosis not present

## 2022-06-15 DIAGNOSIS — M549 Dorsalgia, unspecified: Secondary | ICD-10-CM | POA: Diagnosis not present

## 2022-06-15 DIAGNOSIS — G4700 Insomnia, unspecified: Secondary | ICD-10-CM | POA: Diagnosis not present

## 2022-06-15 DIAGNOSIS — M353 Polymyalgia rheumatica: Secondary | ICD-10-CM | POA: Diagnosis not present

## 2022-06-15 DIAGNOSIS — E1169 Type 2 diabetes mellitus with other specified complication: Secondary | ICD-10-CM | POA: Diagnosis not present

## 2022-06-15 DIAGNOSIS — M25511 Pain in right shoulder: Secondary | ICD-10-CM | POA: Diagnosis not present

## 2022-06-15 DIAGNOSIS — I10 Essential (primary) hypertension: Secondary | ICD-10-CM | POA: Diagnosis not present

## 2022-06-15 DIAGNOSIS — N62 Hypertrophy of breast: Secondary | ICD-10-CM | POA: Diagnosis not present

## 2022-06-15 DIAGNOSIS — J849 Interstitial pulmonary disease, unspecified: Secondary | ICD-10-CM | POA: Diagnosis not present

## 2022-06-20 ENCOUNTER — Encounter (HOSPITAL_COMMUNITY): Payer: Self-pay

## 2022-06-20 NOTE — Patient Instructions (Addendum)
SURGICAL WAITING ROOM VISITATION  Patients having surgery or a procedure may have no more than 2 support people in the waiting area - these visitors may rotate.    Children under the age of 35 must have an adult with them who is not the patient.  Due to an increase in RSV and influenza rates and associated hospitalizations, children ages 77 and under may not visit patients in [place].  If the patient needs to stay at the hospital during part of their recovery, the visitor guidelines for inpatient rooms apply. Pre-op nurse will coordinate an appropriate time for 1 support person to accompany patient in pre-op.  This support person may not rotate.    Please refer to the [facility] website for the visitor guidelines for Inpatients (after your surgery is over and you are in a regular room).       Your procedure is scheduled on: 07-11-22   Report to [facility] Main Entrance    Report to admitting at      [place]  AM   Call this number if you have problems the morning of surgery [phone]   Do not eat food  or drink liquids  :After Midnight.                           If you have questions, please contact your surgeon's office.   FOLLOW ANY ADDITIONAL PRE OP INSTRUCTIONS YOU RECEIVED FROM YOUR SURGEON'S OFFICE!!!     Oral Hygiene is also important to reduce your risk of infection.                                    Remember - BRUSH YOUR TEETH THE MORNING OF SURGERY WITH YOUR REGULAR TOOTHPASTE  DENTURES WILL BE REMOVED PRIOR TO SURGERY PLEASE DO NOT APPLY "Poly grip" OR ADHESIVES!!!   Do NOT smoke after Midnight   Take these medicines the morning of surgery with A SIP OF WATER: pantoprazole, tamsulosin, prednisone, metoprolol, hydroxychloroquine, gabapentin, ezetimibe, diltiazem  DO NOT TAKE ANY ORAL DIABETIC MEDICATIONS DAY OF YOUR SURGERY  Bring CPAP mask and tubing day of surgery.                              You may not have any metal on your body  including hair pins, jewelry, and body piercing             Do not wear  lotions, powders, /cologne, or deodorant                Men may shave face and neck.   Do not bring valuables to the hospital. [place].   Contacts, glasses, dentures or bridgework may not be worn into surgery.   Bring small overnight bag day of surgery.   DO NOT [place]. PHARMACY WILL DISPENSE MEDICATIONS LISTED ON YOUR MEDICATION LIST TO YOU DURING YOUR ADMISSION [place]!    Patients discharged on the day of surgery will not be allowed to drive home.  Someone NEEDS to stay with you for the first 24 hours after anesthesia.  Please read over the following fact sheets you were given: IF [place] [phone]   If you received a COVID test during your pre-op visit  it is requested that you wear a mask when out in public, stay away from anyone that may not be feeling well and notify your surgeon if you develop symptoms. If you test positive for Covid or have been in contact with anyone that has tested positive in the last 10 days please notify you surgeon.   [place]- Preparing for Total Shoulder Arthroplasty    Before surgery, you can play an important role. Because skin is not sterile, your skin needs to be as free of germs as possible. You can reduce the number of germs on your skin by using the following products. Benzoyl Peroxide Gel Reduces the number of germs present on the skin Applied twice a day to shoulder area starting two days before surgery    ==================================================================  Please follow these instructions carefully:  BENZOYL PEROXIDE 5% GEL  Please do not use if you have an allergy to benzoyl peroxide.   If your skin becomes reddened/irritated stop using the benzoyl peroxide.  Starting two  days before surgery, apply as follows: Apply benzoyl peroxide in the morning and at night. Apply after taking a shower. If you are not taking a shower clean entire shoulder front, back, and side along with the armpit with a clean wet washcloth.  Place a quarter-sized dollop on your shoulder and rub in thoroughly, making sure to cover the front, back, and side of your shoulder, along with the armpit.   2 days before ____ AM   ____ PM              1 day before ____ AM   ____ PM                         Do this twice a day for two days.  (Last application is the night before surgery, AFTER using the CHG soap as described below).  Do NOT apply benzoyl peroxide gel on the day of surgery.     [place] - Preparing for Surgery Before surgery, you can play an important role.  Because skin is not sterile, your skin needs to be as free of germs as possible.  You can reduce the number of germs on your skin by washing with CHG (chlorahexidine gluconate) soap before surgery.  CHG is an antiseptic cleaner which kills germs and bonds with the skin to continue killing germs even after washing. Please DO NOT use if you have an allergy to CHG or antibacterial soaps.  If your skin becomes reddened/irritated stop using the CHG and inform your nurse when you arrive at Short Stay. Do not shave (including legs and underarms) for at least 48 hours prior to the first CHG shower.  You may shave your face/neck. Please follow these instructions carefully:  1.  Shower with CHG Soap the night before surgery and the  morning of Surgery.  2.  If you choose to wash your hair, wash your hair first as usual with your  normal  shampoo.  3.  After you shampoo, rinse your hair and body thoroughly to remove the  shampoo.                           4.  Use CHG as you  would any other liquid soap.  You can apply chg directly  to the skin and wash                       Gently with a scrungie or clean washcloth.  5.  Apply the CHG Soap to  your body ONLY FROM THE NECK DOWN.   Do not use on face/ open                           Wound or open sores. Avoid contact with eyes, ears mouth and genitals (private parts).                       Wash face,  Genitals (private parts) with your normal soap.             6.  Wash thoroughly, paying special attention to the area where your surgery  will be performed.  7.  Thoroughly rinse your body with warm water from the neck down.  8.  DO NOT shower/wash with your normal soap after using and rinsing off  the CHG Soap.                9.  Pat yourself dry with a clean towel.            10.  Wear clean pajamas.            11.  Place clean sheets on your bed the night of your first shower and do not  sleep with pets. Day of Surgery : Do not apply any lotions/deodorants the morning of surgery.  Please wear clean clothes to the hospital/surgery center.  FAILURE TO FOLLOW THESE INSTRUCTIONS MAY RESULT IN THE CANCELLATION OF YOUR SURGERY PATIENT SIGNATURE_________________________________  NURSE SIGNATURE__________________________________  ________________________________________________________________________  Adam Phenix  An incentive spirometer is a tool that can help keep your lungs clear and active. This tool measures how well you are filling your lungs with each breath. Taking long deep breaths may help reverse or decrease the chance of developing breathing (pulmonary) problems (especially infection) following: A long period of time when you are unable to move or be active. BEFORE THE PROCEDURE  If the spirometer includes an indicator to show your best effort, your nurse or respiratory therapist will set it to a desired goal. If possible, sit up straight or lean slightly forward. Try not to slouch. Hold the incentive spirometer in an upright position. INSTRUCTIONS FOR USE  Sit on the edge of your bed if possible, or sit up as far as you can in bed or on a chair. Hold the incentive  spirometer in an upright position. Breathe out normally. Place the mouthpiece in your mouth and seal your lips tightly around it. Breathe in slowly and as deeply as possible, raising the piston or the ball toward the top of the column. Hold your breath for 3-5 seconds or for as long as possible. Allow the piston or ball to fall to the bottom of the column. Remove the mouthpiece from your mouth and breathe out normally. Rest for a few seconds and repeat Steps 1 through 7 at least 10 times every 1-2 hours when you are awake. Take your time and take a few normal breaths between deep breaths. The spirometer may include an indicator to show your best effort. Use the indicator as a goal to work toward during each repetition. After each set of  10 deep breaths, practice coughing to be sure your lungs are clear. If you have an incision (the cut made at the time of surgery), support your incision when coughing by placing a pillow or rolled up towels firmly against it. Once you are able to get out of bed, walk around indoors and cough well. You may stop using the incentive spirometer when instructed by your caregiver.  RISKS AND COMPLICATIONS Take your time so you do not get dizzy or light-headed. If you are in pain, you may need to take or ask for pain medication before doing incentive spirometry. It is harder to take a deep breath if you are having pain. AFTER USE Rest and breathe slowly and easily. It can be helpful to keep track of a log of your progress. Your caregiver can provide you with a simple table to help with this. If you are using the spirometer at home, follow these instructions: [place] IF:  You are having difficultly using the spirometer. You have trouble using the spirometer as often as instructed. Your pain medication is not giving enough relief while using the spirometer. You develop fever of 100.5 F (38.1 C) or higher. SEEK IMMEDIATE MEDICAL CARE IF:  You cough up bloody  sputum that had not been present before. You develop fever of 102 F (38.9 C) or greater. You develop worsening pain at or near the incision site. MAKE SURE YOU:  Understand these instructions. Will watch your condition. Will get help right away if you are not doing well or get worse. Document Released: 09/10/2006 Document Revised: 07/23/2011 Document Reviewed: 11/11/2006 [facility] Patient Information 2014 [place], [place].   ________________________________________________________________________

## 2022-06-20 NOTE — Progress Notes (Addendum)
PCP - Allyn Kenner, MD Cardiologist -Dr. Ellouise Newer LOV 05-28-22 Althia Forts, NP  Clearance Pulm- Dr. Ann Lions ,MD LOV 06-05-22 epic Clearance   PPM/ICD -  Device Orders -  Rep Notified -   Chest x-ray - CT cheat 01-09-22 EKG - 05-28-22 epic Stress Test -  ECHO - 01-08-22 Cardiac Cath -   Sleep Study -  CPAP -   Fasting Blood Sugar -  Checks Blood Sugar _____ times a day  Blood Thinner Instructions:Elequis Hold 3 days  last dose 2-24 Aspirin Instructions:  ERAS Protcol - PRE-SURGERY G2-    COVID vaccine -yes  Activity--Able to complete ADL's without SOB or CP Anesthesia review: ILD, HTN, CAD, Hx difficult intubation, a fib, pulmonary fibrosis  Patient denies shortness of breath, fever, cough and chest pain at PAT appointment   All instructions explained to the patient, with a verbal understanding of the material. Patient agrees to go over the instructions while at home for a better understanding. Patient also instructed to self quarantine after being tested for COVID-19. The opportunity to ask questions was provided.

## 2022-06-28 ENCOUNTER — Encounter (HOSPITAL_COMMUNITY): Payer: Self-pay

## 2022-06-28 ENCOUNTER — Encounter (HOSPITAL_COMMUNITY)
Admission: RE | Admit: 2022-06-28 | Discharge: 2022-06-28 | Disposition: A | Discharge status: Home / Self Care | Payer: PPO | Source: Ambulatory Visit | Attending: Orthopaedic Surgery | Admitting: Orthopaedic Surgery

## 2022-06-28 ENCOUNTER — Other Ambulatory Visit: Payer: Self-pay

## 2022-06-28 VITALS — BP 121/75 | HR 79 | Temp 98.8°F | Resp 16 | Ht 68.0 in | Wt 185.0 lb

## 2022-06-28 DIAGNOSIS — I10 Essential (primary) hypertension: Secondary | ICD-10-CM | POA: Insufficient documentation

## 2022-06-28 DIAGNOSIS — Z01812 Encounter for preprocedural laboratory examination: Secondary | ICD-10-CM | POA: Diagnosis not present

## 2022-06-28 DIAGNOSIS — R7303 Prediabetes: Secondary | ICD-10-CM | POA: Diagnosis not present

## 2022-06-28 DIAGNOSIS — Z01818 Encounter for other preprocedural examination: Secondary | ICD-10-CM

## 2022-06-28 HISTORY — DX: Other specified postprocedural states: Z98.890

## 2022-06-28 HISTORY — DX: Other specified postprocedural states: R11.2

## 2022-06-28 LAB — BASIC METABOLIC PANEL
Anion gap: 11 (ref 5–15)
BUN: 9 mg/dL (ref 8–23)
CO2: 26 mmol/L (ref 22–32)
Calcium: 8.7 mg/dL — ABNORMAL LOW (ref 8.9–10.3)
Chloride: 103 mmol/L (ref 98–111)
Creatinine, Ser: 1.03 mg/dL (ref 0.61–1.24)
GFR, Estimated: >60 mL/min (ref >60)
Glucose, Bld: 102 mg/dL — ABNORMAL HIGH (ref 70–99)
Potassium: 4 mmol/L (ref 3.5–5.1)
Sodium: 140 mmol/L (ref 135–145)

## 2022-06-28 LAB — CBC
HCT: 46.7 % (ref 39.0–52.0)
Hemoglobin: 15.8 g/dL (ref 13.0–17.0)
MCH: 36.2 pg — ABNORMAL HIGH (ref 26.0–34.0)
MCHC: 33.8 g/dL (ref 30.0–36.0)
MCV: 106.9 fL — ABNORMAL HIGH (ref 80.0–100.0)
Platelets: 219 10*3/uL (ref 150–400)
RBC: 4.37 MIL/uL (ref 4.22–5.81)
RDW: 12 % (ref 11.5–15.5)
WBC: 10.3 10*3/uL (ref 4.0–10.5)
nRBC: 0 % (ref 0.0–0.2)

## 2022-06-28 LAB — SURGICAL PCR SCREEN
MRSA, PCR: NEGATIVE
Staphylococcus aureus: NEGATIVE

## 2022-06-28 LAB — HEMOGLOBIN A1C
Hgb A1c MFr Bld: 5.2 % (ref 4.8–5.6)
Mean Plasma Glucose: 102.54 mg/dL

## 2022-06-28 LAB — GLUCOSE, CAPILLARY: Glucose-Capillary: 123 mg/dL — ABNORMAL HIGH (ref 70–99)

## 2022-07-03 NOTE — Anesthesia Preprocedure Evaluation (Addendum)
Anesthesia Evaluation  Patient identified by MRN, date of birth, ID band Patient awake    Reviewed: Allergy & Precautions, NPO status , Patient's Chart, lab work & pertinent test results, reviewed documented beta blocker date and time   History of Anesthesia Complications (+) PONV, DIFFICULT AIRWAY and history of anesthetic complications  Airway Mallampati: III  TM Distance: >3 FB Neck ROM: Full    Dental no notable dental hx.    Pulmonary sleep apnea    Pulmonary exam normal        Cardiovascular hypertension, Pt. on home beta blockers and Pt. on medications Normal cardiovascular exam+ dysrhythmias Atrial Fibrillation   TTE 12/2021: EF 55-60%, grade I DD, valves ok     Neuro/Psych    GI/Hepatic ,GERD  Medicated,,  Endo/Other    Renal/GU      Musculoskeletal  (+) Arthritis ,    Abdominal   Peds  Hematology   Anesthesia Other Findings   Reproductive/Obstetrics                             Anesthesia Physical Anesthesia Plan  ASA: 3  Anesthesia Plan: General   Post-op Pain Management: Regional block* and Tylenol PO (pre-op)*   Induction: Intravenous  PONV Risk Score and Plan: 3 and Treatment may vary due to age or medical condition, Ondansetron and Dexamethasone  Airway Management Planned: Oral ETT  Additional Equipment: None  Intra-op Plan:   Post-operative Plan: Extubation in OR  Informed Consent: I have reviewed the patients History and Physical, chart, labs and discussed the procedure including the risks, benefits and alternatives for the proposed anesthesia with the patient or authorized representative who has indicated his/her understanding and acceptance.     Dental advisory given  Plan Discussed with: CRNA  Anesthesia Plan Comments: (Hx of anaphylaxis to lidocaine. Has received bupivacaine uneventfully multiple times in past. Daiva Huge, MD)       Anesthesia Quick  Evaluation

## 2022-07-03 NOTE — Progress Notes (Signed)
Anesthesia Chart Review   Case: [name] Date/Time: 07/11/22 0715   Procedure: REVERSE SHOULDER ARTHROPLASTY (Right: Shoulder)   Anesthesia type: General   Pre-op diagnosis: djd right shoulder   Location: [facility] ROOM 06 / [facility]   Surgeons: Hiram Gash, MD       DISCUSSION:77 y.o. never smoker with h/o PONV, HTN, PAF, ILD, right shoulder djd scheduled for above procedure 07/11/2022 with Dr. Ophelia Charter.   Pt last seen by pulmonology 06/05/2022. Per OV note, "Low-moderate risk for pulmonary complications from right shoulder surgery Plan - Okay to proceed with right shoulder surgery but required anesthesia support"  Pt last seen by cardiology 05/28/2022. Per OV note, "Preoperative cardiac exam: According to the Revised Cardiac Risk Index (RCRI), his Perioperative Risk of Major Cardiac Event is (%): 0.4. His Functional Capacity in METs is: 5.04 according to the Duke Activity Status Index (DASI). Therefore, based on ACC/AHA guidelines, patient would be at acceptable risk for the planned procedure without further cardiovascular testing. Per office protocol, patient can hold Eliquis for 3 days prior to procedure. Please resume Eliquis as soon as possible postprocedure at the discretion of the surgeon. I will route this recommendation to the requesting party via Epic fax function."  Pt reports last dose of Eliquis will be 07/07/2022.   Anticipate pt can proceed with planned procedure barring acute status change.   VS: BP 121/75   Pulse 79   Temp 37.1 C (Oral)   Resp 16   Ht 5' 8"$  (1.727 m)   Wt 83.9 kg   SpO2 95%   BMI 28.13 kg/m   PROVIDERS: [name], MD is PCP   Cardiologist -Dr. Shelva Majestic   Pulm- Dr. Ann Lions  LABS: Labs reviewed: Acceptable for surgery. (all labs ordered are listed, but only abnormal results are displayed)  Labs Reviewed  CBC - Abnormal; Notable for the following components:      Result Value   MCV 106.9 (*)    MCH 36.2 (*)    All other components  within normal limits  BASIC METABOLIC PANEL - Abnormal; Notable for the following components:   Glucose, Bld 102 (*)    Calcium 8.7 (*)    All other components within normal limits  GLUCOSE, CAPILLARY - Abnormal; Notable for the following components:   Glucose-Capillary 123 (*)    All other components within normal limits  SURGICAL PCR SCREEN  HEMOGLOBIN A1C     IMAGES:   EKG:   CV: Echo 01/08/2022 1. Left ventricular ejection fraction, by estimation, is 55 to 60%. The  left ventricle has normal function. The left ventricle has no regional  wall motion abnormalities. Left ventricular diastolic parameters are  consistent with Grade I diastolic  dysfunction (impaired relaxation).   2. Right ventricular systolic function is normal. The right ventricular  size is normal.   3. The mitral valve is normal in structure. No evidence of mitral valve  regurgitation. No evidence of mitral stenosis.   4. The aortic valve is tricuspid. Aortic valve regurgitation is not  visualized. No aortic stenosis is present.   5. The inferior vena cava is normal in size with greater than 50%  respiratory variability, suggesting right atrial pressure of 3 mmHg.   Past Medical History:  Diagnosis Date   Allergy to alpha-gal    2015; has been able to re-introduce red meat for the past 2 1/2 years without reaction (as of 01/21/19)   Arthritis    Chest pain  a. 03/2017: echo showing EF of 60-65%, no regional WMA or significant valve abnormalities. b. 03/2016: NST with no evidence of ischemia.    Complication of anesthesia    "anaphylactic" reaction to Xylocaine > 10 years ago; reported later recieved marcaine without issue   Difficult intubation 02/19/2018   No difficulty 01/26/19 with Mac and 3 or 11/10/19 with Glidescope   Digestive problems    on Prednisone prn for this issue- diarrhea   Dyspnea    pulmonary fibrosis   Dysrhythmia    irregular due to Myocarditis- takes Atenolol, Norvasc   GERD  (gastroesophageal reflux disease)    H/O viral myocarditis    25 years ago   Head injury, closed, with concussion    Breif LOC   Hyperlipidemia    Hypertension    Mild mitral valve prolapse    per Dr Evette Georges notes   PAF (paroxysmal atrial fibrillation) ([place])    a. initially occuring in 02/2016. b. recurrent in 07/2016. Placed on Eliquis   Polymyalgia rheumatica (HCC)    PONV (postoperative nausea and vomiting)    Pre-diabetes    elevated due to high dose of prednsione for back no longer elevated   Pulmonary fibrosis (HCC)    Sleep apnea    no cpap mild    Past Surgical History:  Procedure Laterality Date   ANTERIOR CERVICAL DECOMP/DISCECTOMY FUSION N/A 11/10/2019   Procedure: Cervical Three-Four Anterior cervical decompression/discectomy/fusion;  Surgeon: [name], MD;  Location: [place];  Service: Neurosurgery;  Laterality: N/A;  Cervical Three-Four Anterior cervical decompression/discectomy/fusion   apendectomy  1965   APPENDECTOMY     APPLICATION OF ROBOTIC ASSISTANCE FOR SPINAL PROCEDURE N/A 12/22/2020   Procedure: APPLICATION OF ROBOTIC ASSISTANCE FOR SPINAL PROCEDURE;  Surgeon: [name], MD;  Location: [place];  Service: Neurosurgery;  Laterality: N/A;   BACK SURGERY     bone spur Bilateral 1999   feet   CARDIAC CATHETERIZATION  2001   CHOLECYSTECTOMY  2004   COLON RESECTION N/A 06/15/2018   Procedure: SIGMOID COLON RESECTION;  Surgeon: [name], MD;  [facility];  Service: General;  Laterality: N/A;   COLONOSCOPY     COLOSTOMY N/A 06/15/2018   Procedure: COLOSTOMY;  Surgeon: [name], MD;  [facility];  Service: General;  Laterality: N/A;   COLOSTOMY TAKEDOWN N/A 01/26/2019   Procedure: LAPAROSCOPIC ASSISTED COLOSTOMY TAKEDOWN;  Surgeon: [name], MD;  Location: [place];  Service: General;  Laterality: N/A;   EYE SURGERY Bilateral    cataract removal   LAPAROSCOPIC LYSIS OF ADHESIONS N/A 01/26/2019   Procedure: LAPAROSCOPIC LYSIS OF ADHESIONS;   Surgeon: [name], MD;  Location: [place];  Service: General;  Laterality: N/A;   LAPAROTOMY N/A 06/15/2018   Procedure: EXPLORATORY LAPAROTOMY;  Surgeon: [name], MD;  [facility];  Service: General;  Laterality: N/A;   LUMBAR LAMINECTOMY/ DECOMPRESSION WITH MET-RX Right 05/05/2013   Procedure: Right Lumbar three-four Extraforaminal Microdiskectomy with Metrex;  Surgeon: [name], MD;  [facility];  Service: Neurosurgery;  Laterality: Right;  Right Lumbar three-four Extraforaminal Microdiskectomy with Metrex   LUNG BIOPSY Right 02/19/2018   Procedure: LUNG BIOPSY;  Surgeon: [name], MD;  Location: [place];  Service: Thoracic;  Laterality: Right;   POSTERIOR LUMBAR FUSION 4 LEVEL N/A 12/22/2020   Procedure: Lumbar one-two Posterior lumbar interbody fusion with stabilization from Thoracic ten to Lumbar one with robotic screw placement;  Surgeon: [name], MD;  Location: [facility];  Service: Neurosurgery;  Laterality: N/A;  RM 20   SHOULDER OPEN ROTATOR CUFF REPAIR Bilateral 2001   TONSILLECTOMY     VIDEO ASSISTED THORACOSCOPY Right 02/19/2018   Procedure: VIDEO ASSISTED THORACOSCOPY;  Surgeon: [name], MD;  [facility];  Service: Thoracic;  Laterality: Right;    MEDICATIONS:  acetaminophen (TYLENOL 8 HOUR) 650 MG CR tablet   Capsaicin (CAPSAICIN TOPICAL PAIN PATCH) 0.025 % PTCH   cetirizine (ZYRTEC) 10 MG tablet   Cholecalciferol (DIALYVITE VITAMIN D 5000) 125 MCG (5000 UT) capsule   diltiazem (CARDIZEM CD) 120 MG 24 hr capsule   diphenoxylate-atropine (LOMOTIL) 2.5-0.025 MG tablet   ELIQUIS 5 MG TABS tablet   ezetimibe (ZETIA) 10 MG tablet   fluticasone (FLONASE) 50 MCG/ACT nasal spray   gabapentin (NEURONTIN) 300 MG capsule   hydroxychloroquine (PLAQUENIL) 200 MG tablet   metoprolol tartrate (LOPRESSOR) 25 MG tablet   ondansetron (ZOFRAN) 4 MG tablet   pantoprazole (PROTONIX) 40 MG tablet   predniSONE (DELTASONE) 5 MG tablet    rosuvastatin (CRESTOR) 10 MG tablet   tamsulosin (FLOMAX) 0.4 MG CAPS capsule   Trolamine Salicylate (ASPERCREME EX)   No current facility-administered medications for this encounter.    Konrad Felix Ward, PA-C [facility] [phone]

## 2022-07-04 NOTE — Care Plan (Signed)
Ortho Bundle Case Management Note  Patient Details  Name: [name] MRN: [id number] Date of Birth: [birth year] spoke with patient. states that he received packet. has no questions. preop at hospital tomorrow. Will discharge to home with family to assist. no DME needs OPPT set up with [facility]PP[facility]. discharge instructions discussed. no questions at this time. Patient and MD in agreement with plan. Choice offered                     DME Arranged:    DME Agency:     HH Arranged:    HH Agency:     Additional Comments: Please contact me with any questions of if this plan should need to change.  Ladell Heads,  [place] Specialist  [phone] 07/04/2022, 4:00 PM

## 2022-07-10 NOTE — H&P (Signed)
PREOPERATIVE H&P  Chief Complaint: djd right shoulder  HPI: [name] is a 77 y.o. male who is scheduled for Procedure(s): [place].   Patient has a past medical history significant for coronary artery disease, pulmonary fibrosis and polymyalgia rheumatica. He is taking Prednisone 5 mg daily. He is on Eliquis for a blood thinner. .   Patient has had right shoulder pain for quite some time. He had a cuff repair years ago. His shoulder pain recently has worsened.   Symptoms are rated as moderate to severe, and have been worsening.  This is significantly impairing activities of daily living.    Please see clinic note for further details on this patient's care.    He has elected for surgical management.   Past Medical History:  Diagnosis Date   Allergy to alpha-gal    2015; has been able to re-introduce red meat for the past 2 1/2 years without reaction (as of 01/21/19)   Arthritis    Chest pain    a. 03/2017: echo showing EF of 60-65%, no regional WMA or significant valve abnormalities. b. 03/2016: NST with no evidence of ischemia.    Complication of anesthesia    "anaphylactic" reaction to Xylocaine > 10 years ago; reported later recieved marcaine without issue   Difficult intubation 02/19/2018   No difficulty 01/26/19 with Mac and 3 or 11/10/19 with Glidescope   Digestive problems    on Prednisone prn for this issue- diarrhea   Dyspnea    pulmonary fibrosis   Dysrhythmia    irregular due to Myocarditis- takes Atenolol, Norvasc   GERD (gastroesophageal reflux disease)    H/O viral myocarditis    25 years ago   Head injury, closed, with concussion    Breif LOC   Hyperlipidemia    Hypertension    Mild mitral valve prolapse    per Dr Evette Georges notes   PAF (paroxysmal atrial fibrillation) ([facility])    a. initially occuring in 02/2016. b. recurrent in 07/2016. Placed on Eliquis   Polymyalgia rheumatica (HCC)    PONV (postoperative nausea and  vomiting)    Pre-diabetes    elevated due to high dose of prednsione for back no longer elevated   Pulmonary fibrosis (HCC)    Sleep apnea    no cpap mild   Past Surgical History:  Procedure Laterality Date   ANTERIOR CERVICAL DECOMP/DISCECTOMY FUSION N/A 11/10/2019   Procedure: Cervical Three-Four Anterior cervical decompression/discectomy/fusion;  Surgeon: [name], MD;  Location: [facility];  Service: Neurosurgery;  Laterality: N/A;  Cervical Three-Four Anterior cervical decompression/discectomy/fusion   apendectomy  1965   APPENDECTOMY     APPLICATION OF ROBOTIC ASSISTANCE FOR SPINAL PROCEDURE N/A 12/22/2020   Procedure: APPLICATION OF ROBOTIC ASSISTANCE FOR SPINAL PROCEDURE;  Surgeon: [name], MD;  Location: [place];  Service: Neurosurgery;  Laterality: N/A;   BACK SURGERY     bone spur Bilateral 1999   feet   CARDIAC CATHETERIZATION  2001   CHOLECYSTECTOMY  2004   COLON RESECTION N/A 06/15/2018   Procedure: SIGMOID COLON RESECTION;  Surgeon: [name], MD;  Location: [facility];  Service: General;  Laterality: N/A;   COLONOSCOPY     COLOSTOMY N/A 06/15/2018   Procedure: COLOSTOMY;  Surgeon: [name], MD;  Location: [facility];  Service: General;  Laterality: N/A;   COLOSTOMY TAKEDOWN N/A 01/26/2019   Procedure: LAPAROSCOPIC ASSISTED COLOSTOMY TAKEDOWN;  Surgeon: [name], MD;  Location: [place];  Service: General;  Laterality: N/A;   EYE SURGERY Bilateral    cataract removal   LAPAROSCOPIC LYSIS OF ADHESIONS N/A 01/26/2019   Procedure: LAPAROSCOPIC LYSIS OF ADHESIONS;  Surgeon: [name], MD;  Location: [place];  Service: General;  Laterality: N/A;   LAPAROTOMY N/A 06/15/2018   Procedure: EXPLORATORY LAPAROTOMY;  Surgeon: [name], MD;  Location: [facility];  Service: General;  Laterality: N/A;   LUMBAR LAMINECTOMY/ DECOMPRESSION WITH MET-RX Right 05/05/2013   Procedure: Right Lumbar three-four Extraforaminal Microdiskectomy with Metrex;  Surgeon: [name], MD;   Location: [facility];  Service: Neurosurgery;  Laterality: Right;  Right Lumbar three-four Extraforaminal Microdiskectomy with Metrex   LUNG BIOPSY Right 02/19/2018   Procedure: LUNG BIOPSY;  Surgeon: [name], MD;  Location: [facility];  Service: Thoracic;  Laterality: Right;   POSTERIOR LUMBAR FUSION 4 LEVEL N/A 12/22/2020   Procedure: Lumbar one-two Posterior lumbar interbody fusion with stabilization from Thoracic ten to Lumbar one with robotic screw placement;  Surgeon: [name], MD;  Location: [place];  Service: Neurosurgery;  Laterality: N/A;  RM 20   SHOULDER OPEN ROTATOR CUFF REPAIR Bilateral 2001   TONSILLECTOMY     VIDEO ASSISTED THORACOSCOPY Right 02/19/2018   Procedure: VIDEO ASSISTED THORACOSCOPY;  Surgeon: [name], MD;  [facility];  Service: Thoracic;  Laterality: Right;   Social History   Socioeconomic History   Marital status: Married    Spouse name: Not on file   Number of children: Not on file   Years of education: Not on file   Highest education level: Not on file  Occupational History   Not on file  Tobacco Use   Smoking status: Never   Smokeless tobacco: Never  Vaping Use   Vaping Use: Never used  Substance and Sexual Activity   Alcohol use: Yes    Alcohol/week: 21.0 standard drinks of alcohol    Types: 21 Glasses of wine per week    Comment: 2/3 glasses wine in evening   Drug use: No   Sexual activity: Yes    Birth control/protection: Other-see comments    Comment: old age  Other Topics Concern   Not on file  Social History Narrative   Not on file   Social Determinants of Health   Financial Resource Strain: Not on file  Food Insecurity: Not on file  Transportation Needs: Not on file  Physical Activity: Not on file  Stress: Not on file  Social Connections: Not on file   Family History  Problem Relation Age of Onset   Rheum arthritis Mother    Thyroid cancer Mother    Heart disease Father    Asthma Father     Thyroid cancer Sister    Melanoma Brother    Allergies  Allergen Reactions   Xylocaine [Lidocaine] Anaphylaxis    Injection form   Codeine Nausea And Vomiting   Statins Other (See Comments)    Muscle soreness and an aching feeling all over Myalgias   Aldactone [Spironolactone]     Sore nipples and chest   Alpha-Gal     2015; has been able to re-introduce red meat for the past 2 1/2 years without reaction (as of 01/21/19)   Pirfenidone Other (See Comments)    GI side effects   Percocet [Oxycodone-Acetaminophen] Itching   Prior to Admission medications   Medication Sig Start Date End Date Taking? Authorizing Provider  acetaminophen (TYLENOL 8 HOUR) 650 MG CR tablet Take 1 tablet (650 mg total)  by mouth every 8 (eight) hours as needed for pain. 12/17/21  Yes Leath-Warren, Alda Lea, NP  cetirizine (ZYRTEC) 10 MG tablet Take 10 mg by mouth daily as needed for allergies.    Yes [provider]  Cholecalciferol (DIALYVITE VITAMIN D 5000) 125 MCG (5000 UT) capsule Take 5,000 Units by mouth daily.   Yes [provider]  diltiazem (CARDIZEM CD) 120 MG 24 hr capsule TAKE ONE CAPSULE BY MOUTH DAILY 06/11/22  Yes Troy Sine, MD  diphenoxylate-atropine (LOMOTIL) 2.5-0.025 MG tablet Take 1 tablet by mouth 4 (four) times daily as needed for diarrhea or loose stools.   Yes [provider]  ELIQUIS 5 MG TABS tablet TAKE ONE TABLET ('5MG'$  TOTAL) BY MOUTH TWODAILY Patient taking differently: Take 5 mg by mouth 2 (two) times daily. 01/28/18  Yes Troy Sine, MD  ezetimibe (ZETIA) 10 MG tablet TAKE ONE (1) TABLET BY MOUTH EVERY DAY 07/06/21  Yes Troy Sine, MD  fluticasone [facility] 50 MCG/ACT nasal spray Place 2 sprays into both nostrils daily as needed for allergies or rhinitis.   Yes [provider]  gabapentin (NEURONTIN) 300 MG capsule Take 300 mg by mouth 2 (two) times daily. 06/11/18  Yes [provider]  hydroxychloroquine (PLAQUENIL) 200 MG tablet  Take 200 mg by mouth 2 (two) times daily. 09/22/20  Yes [provider]  metoprolol tartrate (LOPRESSOR) 25 MG tablet TAKE ONE TABLET BY MOUTH TWICE A DAY 02/25/18  Yes Troy Sine, MD  ondansetron (ZOFRAN) 4 MG tablet Take 4 mg by mouth every 8 (eight) hours as needed for nausea or vomiting.    Yes [provider]  pantoprazole (PROTONIX) 40 MG tablet Take 40 mg by mouth daily. 01/01/22  Yes [provider]  predniSONE (DELTASONE) 5 MG tablet Take 5 mg by mouth daily.    Yes [provider]  rosuvastatin (CRESTOR) 10 MG tablet TAKE ONE-HALF TABLET ('5MG'$  TOTAL) BY MOUTH ONCE A WEEK 02/26/22  Yes Troy Sine, MD  tamsulosin (FLOMAX) 0.4 MG CAPS capsule Take 0.4 mg by mouth 2 (two) times daily.    Yes [provider]  Trolamine Salicylate (ASPERCREME EX) Apply 1 application topically daily as needed (arthritis pain).   Yes [provider]  Capsaicin (CAPSAICIN TOPICAL PAIN PATCH) 0.025 % PTCH Apply 1 patch topically every 12 (twelve) hours. Patient not taking: Reported on 06/26/2022 12/17/21   Leath-Warren, Alda Lea, NP    ROS: All other systems have been reviewed and were otherwise negative with the exception of those mentioned in the HPI and as above.  Physical Exam: General: Alert, no acute distress Cardiovascular: No pedal edema Respiratory: No cyanosis, no use of accessory musculature GI: No organomegaly, abdomen is soft and non-tender Skin: No lesions in the area of chief complaint Neurologic: Sensation intact distally Psychiatric: Patient is competent for consent with normal mood and affect Lymphatic: No axillary or cervical lymphadenopathy  MUSCULOSKELETAL:  On examination he has active forward elevation to about 90, passive 110-120, external rotation to 30, internal rotation to the belt line. Cuff strength is weak. He has a positive drop arm test with range of motion and cuff testing.   Imaging: X-rays demonstrate a cuff  tear arthropathy with findings of proximal migration of the humeral head.  BMI: Estimated body mass index is 28.13 kg/m as calculated from the following:   Height as of 06/28/22: '5\' 8"'$  (1.727 m).   Weight as of 06/28/22: 83.9 kg.  Lab Results  Component Value Date   ALBUMIN 3.9 10/31/2021   Diabetes: Patient does not have a diagnosis of diabetes. Lab Results  Component Value Date   HGBA1C 5.2 06/28/2022     Smoking Status:   reports that he has never smoked. He has never used smokeless tobacco.     Assessment: djd right shoulder  Plan: Plan for Procedure(s): REVERSE SHOULDER ARTHROPLASTY  The risks benefits and alternatives were discussed with the patient including but not limited to the risks of nonoperative treatment, versus surgical intervention including infection, bleeding, nerve injury,  blood clots, cardiopulmonary complications, morbidity, mortality, among others, and they were willing to proceed.   We additionally specifically discussed risks of axillary nerve injury, infection, periprosthetic fracture, continued pain and longevity of implants prior to beginning procedure.    Patient will be closely monitored in PACU for medical stabilization and pain control. If found stable in PACU, patient may be discharged home with outpatient follow-up. If any concerns regarding patient's stabilization patient will be admitted for observation after surgery. The patient is planning to be discharged home with outpatient PT.   The patient acknowledged the explanation, agreed to proceed with the plan and consent was signed.   He received operative clearance from his pulmonologist, Dr. Chase Caller, his PCP, Dr. Nevada Crane, and his cardiologist, Dr. Claiborne Billings.   Operative Plan: Right reverse total shoulder arthroplasty Discharge Medications: standard DVT Prophylaxis: resume eliquis Physical Therapy: outpatient PT Special Discharge needs: sling. Cl[place],  [place]  07/10/2022 3:59 PM

## 2022-07-10 NOTE — Progress Notes (Signed)
Pt is aware to arrive at [facility] admitting at 1045 on Wednesday 07/11/2022 for scheduled surgical procedure. No food after midnight; clear liquids from midnight till 1030 then nothing by mouth.

## 2022-07-11 ENCOUNTER — Other Ambulatory Visit: Payer: Self-pay

## 2022-07-11 ENCOUNTER — Ambulatory Visit (HOSPITAL_COMMUNITY)
Admission: RE | Admit: 2022-07-11 | Discharge: 2022-07-11 | Disposition: A | Discharge status: Home / Self Care | Payer: PPO | Source: Ambulatory Visit | Attending: Orthopaedic Surgery | Admitting: Orthopaedic Surgery

## 2022-07-11 ENCOUNTER — Ambulatory Visit (HOSPITAL_COMMUNITY): Payer: PPO

## 2022-07-11 ENCOUNTER — Encounter (HOSPITAL_COMMUNITY): Payer: Self-pay | Admitting: Orthopaedic Surgery

## 2022-07-11 ENCOUNTER — Ambulatory Visit (HOSPITAL_COMMUNITY): Payer: PPO | Admitting: Physician Assistant

## 2022-07-11 ENCOUNTER — Ambulatory Visit (HOSPITAL_BASED_OUTPATIENT_CLINIC_OR_DEPARTMENT_OTHER): Payer: PPO | Admitting: Anesthesiology

## 2022-07-11 ENCOUNTER — Encounter (HOSPITAL_COMMUNITY)
Admission: RE | Disposition: A | Discharge status: Home / Self Care | Payer: Self-pay | Source: Ambulatory Visit | Attending: Orthopaedic Surgery

## 2022-07-11 DIAGNOSIS — I2510 Atherosclerotic heart disease of native coronary artery without angina pectoris: Secondary | ICD-10-CM | POA: Insufficient documentation

## 2022-07-11 DIAGNOSIS — J8410 Pulmonary fibrosis, unspecified: Secondary | ICD-10-CM | POA: Diagnosis not present

## 2022-07-11 DIAGNOSIS — M19011 Primary osteoarthritis, right shoulder: Secondary | ICD-10-CM

## 2022-07-11 DIAGNOSIS — I10 Essential (primary) hypertension: Secondary | ICD-10-CM

## 2022-07-11 DIAGNOSIS — Z7901 Long term (current) use of anticoagulants: Secondary | ICD-10-CM | POA: Diagnosis not present

## 2022-07-11 DIAGNOSIS — E785 Hyperlipidemia, unspecified: Secondary | ICD-10-CM | POA: Insufficient documentation

## 2022-07-11 DIAGNOSIS — G4730 Sleep apnea, unspecified: Secondary | ICD-10-CM | POA: Diagnosis not present

## 2022-07-11 DIAGNOSIS — M353 Polymyalgia rheumatica: Secondary | ICD-10-CM | POA: Insufficient documentation

## 2022-07-11 DIAGNOSIS — M75101 Unspecified rotator cuff tear or rupture of right shoulder, not specified as traumatic: Secondary | ICD-10-CM | POA: Diagnosis not present

## 2022-07-11 DIAGNOSIS — I480 Paroxysmal atrial fibrillation: Secondary | ICD-10-CM | POA: Diagnosis not present

## 2022-07-11 DIAGNOSIS — I341 Nonrheumatic mitral (valve) prolapse: Secondary | ICD-10-CM | POA: Insufficient documentation

## 2022-07-11 DIAGNOSIS — M1990 Unspecified osteoarthritis, unspecified site: Secondary | ICD-10-CM | POA: Insufficient documentation

## 2022-07-11 DIAGNOSIS — K219 Gastro-esophageal reflux disease without esophagitis: Secondary | ICD-10-CM | POA: Insufficient documentation

## 2022-07-11 DIAGNOSIS — Z96611 Presence of right artificial shoulder joint: Secondary | ICD-10-CM | POA: Diagnosis not present

## 2022-07-11 DIAGNOSIS — Z471 Aftercare following joint replacement surgery: Secondary | ICD-10-CM | POA: Diagnosis not present

## 2022-07-11 DIAGNOSIS — I4891 Unspecified atrial fibrillation: Secondary | ICD-10-CM | POA: Diagnosis not present

## 2022-07-11 DIAGNOSIS — Z01818 Encounter for other preprocedural examination: Secondary | ICD-10-CM

## 2022-07-11 DIAGNOSIS — Z7952 Long term (current) use of systemic steroids: Secondary | ICD-10-CM | POA: Diagnosis not present

## 2022-07-11 DIAGNOSIS — M12811 Other specific arthropathies, not elsewhere classified, right shoulder: Secondary | ICD-10-CM | POA: Diagnosis not present

## 2022-07-11 DIAGNOSIS — G8918 Other acute postprocedural pain: Secondary | ICD-10-CM | POA: Diagnosis not present

## 2022-07-11 HISTORY — PX: REVERSE SHOULDER ARTHROPLASTY: SHX5054

## 2022-07-11 SURGERY — ARTHROPLASTY, SHOULDER, TOTAL, REVERSE
Anesthesia: General | Site: Shoulder | Laterality: Right

## 2022-07-11 MED ORDER — DEXAMETHASONE SODIUM PHOSPHATE 10 MG/ML IJ SOLN
INTRAMUSCULAR | Status: AC
  Filled 2022-07-11: qty 1

## 2022-07-11 MED ORDER — DEXAMETHASONE SODIUM PHOSPHATE 10 MG/ML IJ SOLN
INTRAMUSCULAR | Status: DC | PRN
  Administered 2022-07-11: 10 mg via INTRAVENOUS

## 2022-07-11 MED ORDER — STERILE WATER FOR IRRIGATION IR SOLN
Status: DC | PRN
  Administered 2022-07-11 (×2): 2000 mL

## 2022-07-11 MED ORDER — BUPIVACAINE-EPINEPHRINE (PF) 0.5% -1:200000 IJ SOLN
INTRAMUSCULAR | Status: DC | PRN
  Administered 2022-07-11: 15 mL via PERINEURAL

## 2022-07-11 MED ORDER — TRANEXAMIC ACID-NACL 1000-0.7 MG/100ML-% IV SOLN
1000.0000 mg | INTRAVENOUS | Status: AC
  Administered 2022-07-11: 1000 mg via INTRAVENOUS
  Filled 2022-07-11: qty 100

## 2022-07-11 MED ORDER — FENTANYL CITRATE PF 50 MCG/ML IJ SOSY
PREFILLED_SYRINGE | INTRAMUSCULAR | Status: AC
  Filled 2022-07-11: qty 1

## 2022-07-11 MED ORDER — ACETAMINOPHEN 500 MG PO TABS
1000.0000 mg | ORAL_TABLET | Freq: Once | ORAL | Status: AC
  Administered 2022-07-11: 1000 mg via ORAL
  Filled 2022-07-11: qty 2

## 2022-07-11 MED ORDER — PROPOFOL 10 MG/ML IV BOLUS
INTRAVENOUS | Status: AC
  Filled 2022-07-11: qty 20

## 2022-07-11 MED ORDER — ACETAMINOPHEN 500 MG PO TABS: 1000.0000 mg | ORAL_TABLET | Freq: Three times a day (TID) | ORAL | 0 refills | Status: AC

## 2022-07-11 MED ORDER — EPHEDRINE SULFATE-NACL 50-0.9 MG/10ML-% IV SOSY
PREFILLED_SYRINGE | INTRAVENOUS | Status: DC | PRN
  Administered 2022-07-11: 10 mg via INTRAVENOUS
  Administered 2022-07-11 (×2): 5 mg via INTRAVENOUS

## 2022-07-11 MED ORDER — FENTANYL CITRATE (PF) 100 MCG/2ML IJ SOLN
INTRAMUSCULAR | Status: AC
  Filled 2022-07-11: qty 2

## 2022-07-11 MED ORDER — ONDANSETRON 4 MG PO TBDP
ORAL_TABLET | ORAL | Status: AC
  Filled 2022-07-11: qty 1

## 2022-07-11 MED ORDER — ORAL CARE MOUTH RINSE: 15.0000 mL | Freq: Once | OROMUCOSAL | Status: AC

## 2022-07-11 MED ORDER — ONDANSETRON HCL 4 MG PO TABS: 4.0000 mg | ORAL_TABLET | Freq: Three times a day (TID) | ORAL | 0 refills | Status: AC | PRN

## 2022-07-11 MED ORDER — 0.9 % SODIUM CHLORIDE (POUR BTL) OPTIME
TOPICAL | Status: DC | PRN
  Administered 2022-07-11 (×2): 1000 mL

## 2022-07-11 MED ORDER — ONDANSETRON 4 MG PO TBDP
4.0000 mg | ORAL_TABLET | Freq: Once | ORAL | Status: AC
  Administered 2022-07-11: 4 mg via ORAL

## 2022-07-11 MED ORDER — SODIUM CHLORIDE 0.9 % IR SOLN: Status: DC | PRN

## 2022-07-11 MED ORDER — OXYCODONE HCL 5 MG PO TABS: ORAL_TABLET | ORAL | 0 refills | Status: AC

## 2022-07-11 MED ORDER — VANCOMYCIN HCL 1 G IV SOLR
INTRAVENOUS | Status: DC | PRN
  Administered 2022-07-11: 1000 mg

## 2022-07-11 MED ORDER — VANCOMYCIN HCL 1000 MG IV SOLR
INTRAVENOUS | Status: AC
  Filled 2022-07-11: qty 20

## 2022-07-11 MED ORDER — FENTANYL CITRATE (PF) 100 MCG/2ML IJ SOLN
INTRAMUSCULAR | Status: DC | PRN
  Administered 2022-07-11 (×2): 50 ug via INTRAVENOUS

## 2022-07-11 MED ORDER — PHENYLEPHRINE HCL-NACL 20-0.9 MG/250ML-% IV SOLN
INTRAVENOUS | Status: AC
  Filled 2022-07-11: qty 250

## 2022-07-11 MED ORDER — ROCURONIUM BROMIDE 10 MG/ML (PF) SYRINGE
PREFILLED_SYRINGE | INTRAVENOUS | Status: AC
  Filled 2022-07-11: qty 10

## 2022-07-11 MED ORDER — AMISULPRIDE (ANTIEMETIC) 5 MG/2ML IV SOLN: 10.0000 mg | Freq: Once | INTRAVENOUS | Status: DC | PRN

## 2022-07-11 MED ORDER — FENTANYL CITRATE PF 50 MCG/ML IJ SOSY
25.0000 ug | PREFILLED_SYRINGE | INTRAMUSCULAR | Status: DC | PRN
  Administered 2022-07-11 (×2): 25 ug via INTRAVENOUS

## 2022-07-11 MED ORDER — LACTATED RINGERS IV SOLN: INTRAVENOUS | Status: DC

## 2022-07-11 MED ORDER — CELECOXIB 100 MG PO CAPS: 100.0000 mg | ORAL_CAPSULE | Freq: Two times a day (BID) | ORAL | 0 refills | Status: AC

## 2022-07-11 MED ORDER — ONDANSETRON HCL 4 MG/2ML IJ SOLN
INTRAMUSCULAR | Status: DC | PRN
  Administered 2022-07-11: 4 mg via INTRAVENOUS

## 2022-07-11 MED ORDER — ROCURONIUM BROMIDE 10 MG/ML (PF) SYRINGE
PREFILLED_SYRINGE | INTRAVENOUS | Status: DC | PRN
  Administered 2022-07-11: 60 mg via INTRAVENOUS

## 2022-07-11 MED ORDER — CHLORHEXIDINE GLUCONATE 0.12 % MT SOLN
15.0000 mL | Freq: Once | OROMUCOSAL | Status: AC
  Administered 2022-07-11: 15 mL via OROMUCOSAL

## 2022-07-11 MED ORDER — PHENYLEPHRINE HCL-NACL 20-0.9 MG/250ML-% IV SOLN
INTRAVENOUS | Status: DC | PRN
  Administered 2022-07-11: 40 ug/min via INTRAVENOUS

## 2022-07-11 MED ORDER — EPHEDRINE 5 MG/ML INJ
INTRAVENOUS | Status: AC
  Filled 2022-07-11: qty 5

## 2022-07-11 MED ORDER — PROPOFOL 10 MG/ML IV BOLUS
INTRAVENOUS | Status: DC | PRN
  Administered 2022-07-11: 150 mg via INTRAVENOUS

## 2022-07-11 MED ORDER — CEFAZOLIN SODIUM-DEXTROSE 2-4 GM/100ML-% IV SOLN
2.0000 g | INTRAVENOUS | Status: AC
  Administered 2022-07-11: 2 g via INTRAVENOUS
  Filled 2022-07-11: qty 100

## 2022-07-11 MED ORDER — SUGAMMADEX SODIUM 500 MG/5ML IV SOLN
INTRAVENOUS | Status: DC | PRN
  Administered 2022-07-11: 200 mg via INTRAVENOUS

## 2022-07-11 MED ORDER — ONDANSETRON HCL 4 MG/2ML IJ SOLN
INTRAMUSCULAR | Status: AC
  Filled 2022-07-11: qty 2

## 2022-07-11 MED ORDER — BUPIVACAINE LIPOSOME 1.3 % IJ SUSP
INTRAMUSCULAR | Status: DC | PRN
  Administered 2022-07-11: 10 mL via PERINEURAL

## 2022-07-11 MED ORDER — MIDAZOLAM HCL 2 MG/2ML IJ SOLN
1.0000 mg | INTRAMUSCULAR | Status: DC
  Administered 2022-07-11: 1 mg via INTRAVENOUS
  Filled 2022-07-11: qty 2

## 2022-07-11 MED ORDER — FENTANYL CITRATE PF 50 MCG/ML IJ SOSY
50.0000 ug | PREFILLED_SYRINGE | INTRAMUSCULAR | Status: DC
  Administered 2022-07-11: 50 ug via INTRAVENOUS
  Filled 2022-07-11: qty 2

## 2022-07-11 SURGICAL SUPPLY — 72 items
AID PSTN UNV HD RSTRNT DISP (MISCELLANEOUS) ×1
APL PRP STRL LF DISP 70% ISPRP (MISCELLANEOUS) ×2
BAG COUNTER SPONGE SURGICOUNT (BAG) ×1 IMPLANT
BAG SPNG CNTER NS LX DISP (BAG) ×1
BIT DRILL 3.2 PERIPHERAL SCREW (BIT) IMPLANT
BLADE SAW SGTL 73X25 THK (BLADE) ×1 IMPLANT
BSPLAT GLND +3 29 (Joint) ×1 IMPLANT
CHLORAPREP W/TINT 26 (MISCELLANEOUS) ×2 IMPLANT
CLSR STERI-STRIP ANTIMIC 1/2X4 (GAUZE/BANDAGES/DRESSINGS) ×1 IMPLANT
COOLER ICEMAN CLASSIC (MISCELLANEOUS) IMPLANT
COVER BACK TABLE 60X90IN (DRAPES) ×1 IMPLANT
COVER SURGICAL LIGHT HANDLE (MISCELLANEOUS) ×1 IMPLANT
CUP HUM INSERT SHLD 3/4 39 +0 (Cup) IMPLANT
DRAPE C-ARM 42X120 X-RAY (DRAPES) IMPLANT
DRAPE INCISE IOBAN 66X45 STRL (DRAPES) ×1 IMPLANT
DRAPE ORTHO SPLIT 77X108 STRL (DRAPES) ×2
DRAPE SHEET LG 3/4 BI-LAMINATE (DRAPES) ×2 IMPLANT
DRAPE SURG ORHT 6 SPLT 77X108 (DRAPES) ×2 IMPLANT
DRSG AQUACEL AG ADV 3.5X 6 (GAUZE/BANDAGES/DRESSINGS) ×1 IMPLANT
ELECT BLADE TIP CTD 4 INCH (ELECTRODE) ×1 IMPLANT
ELECT PENCIL ROCKER SW 15FT (MISCELLANEOUS) IMPLANT
ELECT REM PT RETURN 15FT ADLT (MISCELLANEOUS) ×1 IMPLANT
FACESHIELD WRAPAROUND (MASK) ×2 IMPLANT
FACESHIELD WRAPAROUND OR TEAM (MASK) ×2 IMPLANT
GAUZE XEROFORM 1X8 LF (GAUZE/BANDAGES/DRESSINGS) IMPLANT
GLENOID BASEPLATE 29 +3 (Joint) IMPLANT
GLENOSPHERE PERFORM 39 3 (Shoulder) ×1 IMPLANT
GLOVE BIO SURGEON STRL SZ 6.5 (GLOVE) ×2 IMPLANT
GLOVE BIOGEL PI IND STRL 6.5 (GLOVE) ×1 IMPLANT
GLOVE BIOGEL PI IND STRL 8 (GLOVE) ×1 IMPLANT
GLOVE ECLIPSE 8.0 STRL XLNG CF (GLOVE) ×2 IMPLANT
GOWN STRL REUS W/ TWL LRG LVL3 (GOWN DISPOSABLE) ×2 IMPLANT
GOWN STRL REUS W/TWL LRG LVL3 (GOWN DISPOSABLE) ×2
GUIDE PIN 3X75 SHOULDER (PIN) ×1
GUIDEWIRE GLENOID 2.5X220 (WIRE) IMPLANT
HANDPIECE INTERPULSE COAX TIP (DISPOSABLE) ×1
HUMERAL STEM SZ4+ (Stem) ×1 IMPLANT
KIT BASIN OR (CUSTOM PROCEDURE TRAY) ×1 IMPLANT
KIT STABILIZATION SHOULDER (MISCELLANEOUS) ×1 IMPLANT
KIT TURNOVER KIT A (KITS) IMPLANT
MANIFOLD NEPTUNE II (INSTRUMENTS) ×1 IMPLANT
NDL MAYO CATGUT SZ4 TPR NDL (NEEDLE) IMPLANT
NEEDLE MAYO CATGUT SZ4 (NEEDLE) IMPLANT
NS IRRIG 1000ML POUR BTL (IV SOLUTION) ×1 IMPLANT
PACK SHOULDER (CUSTOM PROCEDURE TRAY) ×1 IMPLANT
PAD COLD SHLDR WRAP-ON (PAD) IMPLANT
PAD ORTHO SHOULDER 7X19 LRG (SOFTGOODS) IMPLANT
PIN GUIDE 3X75 SHOULDER (PIN) IMPLANT
RESTRAINT HEAD UNIVERSAL NS (MISCELLANEOUS) ×1 IMPLANT
SCREW 5.5X26 (Screw) IMPLANT
SCREW CENTRAL THREAD 6.5X45 (Screw) IMPLANT
SCREW PERIPHERAL 5.0X34 (Screw) IMPLANT
SET HNDPC FAN SPRY TIP SCT (DISPOSABLE) ×1 IMPLANT
SLING ULTRA III MED (ORTHOPEDIC SUPPLIES) IMPLANT
SPHERE GLENOD +3X39XLATERALIZE (Shoulder) IMPLANT
SPHR GLND +3X39XLATERALIZE (Shoulder) ×1 IMPLANT
SPONGE T-LAP 4X18 ~~LOC~~+RFID (SPONGE) ×1 IMPLANT
STEM HUMERAL SZ4+ (Stem) IMPLANT
SUCTION FRAZIER HANDLE 12FR (TUBING)
SUCTION TUBE FRAZIER 12FR DISP (TUBING) IMPLANT
SUT ETHIBOND 2 V 37 (SUTURE) ×1 IMPLANT
SUT ETHIBOND NAB CT1 #1 30IN (SUTURE) ×1 IMPLANT
SUT ETHILON 2 0 PS N (SUTURE) IMPLANT
SUT FIBERWIRE #5 38 CONV NDL (SUTURE)
SUT MNCRL AB 4-0 PS2 18 (SUTURE) ×1 IMPLANT
SUT VIC AB 0 CT1 36 (SUTURE) IMPLANT
SUT VIC AB 3-0 SH 27 (SUTURE) ×1
SUT VIC AB 3-0 SH 27X BRD (SUTURE) ×1 IMPLANT
SUTURE FIBERWR #5 38 CONV NDL (SUTURE) IMPLANT
TOWEL OR 17X26 10 PK STRL BLUE (TOWEL DISPOSABLE) ×1 IMPLANT
TUBE SUCTION HIGH CAP CLEAR NV (SUCTIONS) ×1 IMPLANT
WATER STERILE IRR 1000ML POUR (IV SOLUTION) ×2 IMPLANT

## 2022-07-11 NOTE — Transfer of Care (Signed)
Immediate Anesthesia Transfer of Care Note  Patient: [name]  Procedure(s) Performed: REVERSE SHOULDER ARTHROPLASTY (Right: Shoulder)  Patient Location: PACU  Anesthesia Type:General  Level of Consciousness: awake, alert , oriented, and patient cooperative  Airway & Oxygen Therapy: Patient Spontanous Breathing and Patient connected to face mask oxygen  Post-op Assessment: Report given to RN, Post -op Vital signs reviewed and stable, and Patient moving all extremities X 4  Post vital signs: Reviewed and stable  Last Vitals:  Vitals Value Taken Time  BP 112/72 07/11/22 1501  Temp    Pulse 75 07/11/22 1504  Resp 20 07/11/22 1504  SpO2 100 % 07/11/22 1504  Vitals shown include unvalidated device data.  Last Pain:  Vitals:   07/11/22 1320  TempSrc:   PainSc: 0-No pain         Complications: No notable events documented.

## 2022-07-11 NOTE — Anesthesia Procedure Notes (Signed)
Anesthesia Regional Block: Interscalene brachial plexus block   Pre-Anesthetic Checklist: , timeout performed,  Correct Patient, Correct Site, Correct Laterality,  Correct Procedure, Correct Position, site marked,  Risks and benefits discussed,  Pre-op evaluation,  At surgeon's request and post-op pain management  Laterality: Right  Prep: Maximum Sterile Barrier Precautions used, chloraprep       Needles:  Injection technique: Single-shot  Needle Type: Echogenic Stimulator Needle     Needle Length: 4cm  Needle Gauge: 22     Additional Needles:   Procedures:,,,, ultrasound used (permanent image in chart),,    Narrative:  Start time: 07/11/2022 1:11 PM End time: 07/11/2022 1:14 PM Injection made incrementally with aspirations every 5 mL.  Performed by: Personally  Anesthesiologist: Brennan Bailey, MD  Additional Notes: Risks, benefits, and alternative discussed. Patient gave consent for procedure. Patient prepped and draped in sterile fashion. Sedation administered, patient remains easily responsive to voice. Relevant anatomy identified with ultrasound guidance. (No lidocaine used due to allergy).Local anesthetic given in 5cc increments with no signs or symptoms of intravascular injection. No pain or paraesthesias with injection. Patient monitored throughout procedure with signs of LAST or immediate complications. Tolerated well. Ultrasound image placed in chart.  Tawny Asal, MD

## 2022-07-11 NOTE — Discharge Instructions (Addendum)
Ophelia Charter MD, MPH Noemi Chapel, PA-C [place] [address], [address] [phone] (tel)   [phone] (fax)   [place] may leave the operative dressing in place until your follow-up appointment. KEEP THE INCISIONS CLEAN AND DRY. There may be a small amount of fluid/bleeding leaking at the surgical site. This is normal after surgery.  If it fills with liquid or blood please call us immediately to change it for you. Use the provided ice machine or Ice packs as often as possible for the first 3-4 days, then as needed for pain relief.   Keep a layer of cloth or a shirt between your skin and the cooling unit to prevent frost bite as it can get very cold.  SHOWERING: - You may shower on Post-Op Day #2.  - The dressing is water resistant but do not scrub it as it may start to peel up.   - You may remove the sling for showering - Gently pat the area dry.  - Do not soak the shoulder in water.  - Do not go swimming in the pool or ocean until your incision has completely healed (about 4-6 weeks after surgery) - KEEP THE INCISIONS CLEAN AND DRY.  EXERCISES Wear the sling at all times  You may remove the sling for showering, but keep the arm across the chest or in a secondary sling.    Accidental/Purposeful External Rotation and shoulder flexion (reaching behind you) is to be avoided at all costs for the first month. It is ok to come out of your sling if your are sitting and have assistance for eating.   Do not lift anything heavier than 1 pound until we discuss it further in clinic.  It is normal for your fingers/hand to become more swollen after surgery and discolored from bruising.   This will resolve over the first few weeks usually after surgery. Please continue to ambulate and do not stay sitting or lying for too long.  Perform foot and wrist pumps to assist in circulation.  PHYSICAL  THERAPY - You will begin physical therapy soon after surgery (unless otherwise specified) - Please call to set up an appointment, if you do not already have one   - A PT referral was sent to [facility] in [place] ([place]) The anesthesia team may have performed a nerve block for you this is a great tool used to minimize pain.   The block may start wearing off overnight (between 8-24 hours postop) When the block wears off, your pain may go from nearly zero to the pain you would have had postop without the block. This is an abrupt transition but nothing dangerous is happening.   This can be a challenging period but utilize your as needed pain medications to try and manage this period. We suggest you use the pain medication the first night prior to going to bed, to ease this transition.  You may take an extra dose of narcotic when this happens if needed   POST-OP MEDICATIONS- Multimodal approach to pain control In general your pain will be controlled with a combination of substances.  Prescriptions unless otherwise discussed are electronically sent to your pharmacy.  This is a carefully made plan we use to minimize narcotic use.     Celebrex - Anti-inflammatory medication taken on a scheduled basis Acetaminophen - Non-narcotic pain medicine taken on a scheduled basis  Oxycodone - This is a strong narcotic, to be used only on an "as needed" basis for SEVERE pain. You may take Benadryl for itching You may take Zofran for any nausea/vomiting Zofran -  take as needed for nausea  You may resume Eliquis 24 hours after surgery  FOLLOW-UP If you develop a Fever (>101.5), Redness or Drainage from the surgical incision site, please call our office to arrange for an evaluation. Please call the office to schedule a follow-up appointment for a wound check, 7-10 days post-operatively.  IF YOU HAVE ANY QUESTIONS, PLEASE FEEL FREE TO CALL OUR OFFICE.  HELPFUL  INFORMATION  Your arm will be in a sling following surgery. You will be in this sling for the next 4 weeks.   You may be more comfortable sleeping in a semi-seated position the first few nights following surgery.  Keep a pillow propped under the elbow and forearm for comfort.  If you have a recliner type of chair it might be beneficial.  If not that is fine too, but it would be helpful to sleep propped up with pillows behind your operated shoulder as well under your elbow and forearm.  This will reduce pulling on the suture lines.  When dressing, put your operative arm in the sleeve first.  When getting undressed, take your operative arm out last.  Loose fitting, button-down shirts are recommended.  In most states it is against the law to drive while your arm is in a sling. And certainly against the law to drive while taking narcotics.  You may return to work/school in the next couple of days when you feel up to it. Desk work and typing in the sling is fine.  We suggest you use the pain medication the first night prior to going to bed, in order to ease any pain when the anesthesia wears off. You should avoid taking pain medications on an empty stomach as it will make you nauseous.  You should wean off your narcotic medicines as soon as you are able.     Most patients will be off or using minimal narcotics before their first postop appointment.   Do not drink alcoholic beverages or take illicit drugs when taking pain medications.  Pain medication may make you constipated.  Below are a few solutions to try in this order: Decrease the amount of pain medication if you aren't having pain. Drink lots of decaffeinated fluids. Drink prune juice and/or each dried prunes  If the first 3 don't work start with additional solutions Take Colace - an over-the-counter stool softener Take Senokot - an over-the-counter laxative Take Miralax - a stronger over-the-counter laxative   Dental  Antibiotics:  In most cases prophylactic antibiotics for Dental procdeures after total joint surgery are not necessary.  Exceptions are as follows:  1. History of prior total joint infection  2. Severely immunocompromised (Organ Transplant, cancer chemotherapy, Rheumatoid biologic meds such as Paia)  3. Poorly controlled diabetes (A1C &gt; 8.0, blood glucose over 200)  If you have one of these conditions, contact your surgeon for an antibiotic prescription, prior to your dental procedure.   For more information including helpful videos and documents visit our website:   [email or web address]

## 2022-07-11 NOTE — Interval H&P Note (Signed)
All questions answered, patient wants to proceed with procedure.  

## 2022-07-11 NOTE — Op Note (Signed)
Orthopaedic Surgery Operative Note (CSN: [id number])  Quientin Bacallao Zirbes  March 27, 1946 Date of Surgery: 07/11/2022   Diagnoses:  Right cuff tear arthropathy  Procedure: Right reverse lateralized  Total Shoulder Arthroplasty   Operative Finding Successful completion of planned procedure.  Patients chronic prednisone historic use likely left him with his thin skin as well as overall poor bone quality.  We were able to get good fixation.  Tug test normal.  Skin closure with nylon due to skin quality. We will monitor in pacu to establish whether appropriate for DC home.  Post-operative plan: The patient will be NWB in sling.  The patient will be will be discharged from PACU if continues to be stable as was plan prior to surgery.  DVT prophylaxis Aspirin 81 mg twice daily for 6 weeks.  Pain control with PRN pain medication preferring oral medicines.  Follow up plan will be scheduled in approximately 7 days for incision check and XR.  Physical therapy to start immediately.  Implants:Tornier perform humeral size 4 long stem, 0 retentive poly, 39+3 glenosphere, 29+3 baseplate, 45 center screw with 2 peripheral screws.  Post-Op Diagnosis: Same Surgeons:Primary: Hiram Gash, MD Assistants:Caroline McBane PA-C Location: [facility] [place] [facility] Anesthesia: General with Exparel Interscalene Antibiotics: Ancef 2g preop, Vancomycin '1000mg'$  locally Tourniquet time: None Estimated Blood Loss: 123XX123 Complications: None Specimens: None Implants: Implant Name Type Inv. Item Serial No. Manufacturer Lot No. LRB No. Used Action  GLENOSPHERE PERFORM 39 3 - [id number] Shoulder GLENOSPHERE PERFORM 39 3  [employer] [id number] Right 1 Implanted  GLENOID BASEPLATE 29 +3 - [id number] Joint GLENOID BASEPLATE 29 +3  [employer] [id number] Right 1 Implanted  CUP HUM INSERT SHLD 3/4 39 +0 - [id number] Cup CUP HUM INSERT SHLD 3/4 39 +0  [employer] [id number] Right 1 Implanted  HUMERAL STEM SZ4+ - [id number] Stem HUMERAL STEM SZ4+   [employer] [id number] Right 1 Implanted  SCREW CENTRAL THREAD 6.5X45 - [id number] Screw SCREW CENTRAL THREAD 6.5X45  [employer]  Right 1 Implanted    Indications for Surgery:   Jamason Landis is a 77 y.o. male with cuff tear arthropathy.  Benefits and risks of operative and nonoperative management were discussed prior to surgery with patient/guardian(s) and informed consent form was completed.  Infection and need for further surgery were discussed as was prosthetic stability and cuff issues.  We additionally specifically discussed risks of axillary nerve injury, infection, periprosthetic fracture, continued pain and longevity of implants prior to beginning procedure.      Procedure:   The patient was identified in the preoperative holding area where the surgical site was marked. Block placed by anesthesia with exparel.  The patient was taken to the OR where a procedural timeout was called and the above noted anesthesia was induced.  The patient was positioned beachchair on allen table with spider arm positioner.  Preoperative antibiotics were dosed.  The patient's right shoulder was prepped and draped in the usual sterile fashion.  A second preoperative timeout was called.       Standard deltopectoral approach was performed with a #10 blade. We dissected down to the subcutaneous tissues and the cephalic vein was taken laterally with the deltoid. Clavipectoral fascia was incised in line with the incision. Deep retractors were placed. The long of the biceps tendon was identified and there was significant tenosynovitis present.  Tenodesis was performed to the pectoralis tendon with #2 Ethibond. The remaining biceps was followed up into the rotator interval where it was released.  The subscapularis was taken down in a full thickness layer with capsule along the humeral neck extending inferiorly around the humeral head. We continued releasing the capsule directly off of the osteophytes inferiorly  all the way around the corner. This allowed [place] to dislocate the humeral head.   The humeral head had evidence of severe osteoarthritic wear with full-thickness cartilage loss and exposed subchondral bone. There was significant flattening of the humeral head.   The rotator cuff was carefully examined and noted to be irreperably torn.  The decision was confirmed that a reverse total shoulder was indicated for this patient.  There were osteophytes along the inferior humeral neck. The osteophytes were removed with an osteotome and a rongeur.  Osteophytes were removed with a rongeur and an osteotome and the anatomic neck was well visualized.     A humeral cutting guide was used extra medullary with a pin to help control version. The version was set at 20 of retroversion. Humeral osteotomy was performed with an oscillating saw. The head fragment was passed off the back table.  A cut protector plate was placed.  The subscapularis was again identified and immediately we took care to palpate the axillary nerve anteriorly and verify its position with gentle palpation as well as the tug test.  We then released the SGHL with bovie cautery prior to placing a curved mayo at the junction of the anterior glenoid well above the axillary nerve and bluntly dissecting the subscapularis from the capsule.  We then carefully protected the axillary nerve as we gently released the inferior capsule to fully mobilize the subscapularis.  An anterior deltoid retractor was then placed as well as a small Hohmann retractor superiorly.   The glenoid was inspected and had evidence of severe osteoarthritic wear with full-thickness cartilage loss and exposed subchondral bone.    The remaining labrum was removed circumferentially taking great care not to disrupt the posterior capsule.   The glenoid drill guide was placed and used to drill a guide pin in the center, inferior position. The glenoid face was then reamed concentrically  over the guide wire. The center hole was drilled over the guidepin in a near anatomic angle of version. Next the glenoid vault was drilled back to a depth of 45 mm.  We tapped and then placed a 29m size baseplate with additional 311mlateralization was selected with a 6.5 mm x 45 mm length central screw.  The base plate was screwed into the glenoid vault obtaining secure fixation. We next placed superior and inferior locking screws for additional fixation.  Next a 39+3 mm glenosphere was selected and impacted onto the baseplate. The center screw was tightened.  We turned attention back to the humeral side. The cut protector was removed.  We used the perform humeral sizing block to select the appropriate size which for this patient was a 4.  We then placed our center pin and reamed over it concentrically obtaining appropriate inset.  We then used our lateralizing chisel to prepare the lateral aspect of the humerus.  At that point we selected the appropriate implant trialing a 4 long.  Using this trial implant we trialed multiple polyethylene sizes settling on a 0 which provided good stability and range of motion without excess soft tissue tension. The offset was dialed in to match the normal anatomy. The shoulder was trialed.  There was good ROM in all planes and the shoulder was stable with no inferior translation.  The real humeral implants were  opened after again confirming sizes.  The trial was removed. #5 Fiberwire x2 sutures passed through the humeral neck for subscap repair. The humeral component was press-fit obtaining a secure fit. The joint was reduced and thoroughly irrigated with pulsatile lavage. Subscap was repaired back with #5 Fiberwire sutures through bone tunnels. Hemostasis was obtained. The deltopectoral interval was reapproximated with #1 Ethibond. The subcutaneous tissues were closed with 2-0 Vicryl and the skin was closed with running nylon.    The wounds were cleaned and dried and an  Aquacel dressing was placed. The drapes taken down. The arm was placed into sling with abduction pillow. Patient was awakened, extubated, and transferred to the recovery room in stable condition. There were no intraoperative complications. The sponge, needle, and attention counts were correct at the end of the case.     Noemi Chapel, PA-C, present and scrubbed throughout the case, critical for completion in a timely fashion, and for retraction, instrumentation, closure.

## 2022-07-11 NOTE — Anesthesia Procedure Notes (Signed)
Procedure Name: Intubation Date/Time: 07/11/2022 1:50 PM  Performed by: Delane Wessinger D, CRNAPre-anesthesia Checklist: Patient identified, Emergency Drugs available, Suction available and Patient being monitored Patient Re-evaluated:Patient Re-evaluated prior to induction Oxygen Delivery Method: Circle system utilized Preoxygenation: Pre-oxygenation with 100% oxygen Induction Type: IV induction Ventilation: Mask ventilation without difficulty Laryngoscope Size: Mac and 4 Grade View: Grade III Tube type: Oral Tube size: 7.5 mm Number of attempts: 1 Airway Equipment and Method: Stylet and Oral airway Placement Confirmation: ETT inserted through vocal cords under direct vision, positive ETCO2 and breath sounds checked- equal and bilateral Secured at: 22 cm Tube secured with: Tape Dental Injury: Teeth and Oropharynx as per pre-operative assessment

## 2022-07-12 ENCOUNTER — Encounter (HOSPITAL_COMMUNITY): Payer: Self-pay | Admitting: Orthopaedic Surgery

## 2022-07-12 NOTE — Anesthesia Postprocedure Evaluation (Signed)
Anesthesia Post Note  Patient: [name]  Procedure(s) Performed: REVERSE SHOULDER ARTHROPLASTY (Right: Shoulder)     Patient location during evaluation: PACU Anesthesia Type: General Level of consciousness: awake and alert Pain management: pain level controlled Vital Signs Assessment: post-procedure vital signs reviewed and stable Respiratory status: spontaneous breathing, nonlabored ventilation and respiratory function stable Cardiovascular status: blood pressure returned to baseline Postop Assessment: no apparent nausea or vomiting Anesthetic complications: no   No notable events documented.  Last Vitals:  Vitals:   07/11/22 1530 07/11/22 1545  BP: 96/73 117/68  Pulse: 74 74  Resp: 18 19  Temp:  (!) 36.3 C  SpO2: 92% 91%    Last Pain:  Vitals:   07/11/22 1545  TempSrc:   PainSc: 2    Pain Goal:                   Marthenia Rolling

## 2022-07-13 ENCOUNTER — Encounter (HOSPITAL_COMMUNITY): Payer: Self-pay | Admitting: Occupational Therapy

## 2022-07-13 ENCOUNTER — Ambulatory Visit (HOSPITAL_COMMUNITY): Payer: PPO | Attending: Orthopaedic Surgery | Admitting: Occupational Therapy

## 2022-07-13 ENCOUNTER — Other Ambulatory Visit: Payer: Self-pay | Admitting: *Deleted

## 2022-07-13 DIAGNOSIS — M25611 Stiffness of right shoulder, not elsewhere classified: Secondary | ICD-10-CM | POA: Insufficient documentation

## 2022-07-13 DIAGNOSIS — R29898 Other symptoms and signs involving the musculoskeletal system: Secondary | ICD-10-CM | POA: Insufficient documentation

## 2022-07-13 DIAGNOSIS — M25511 Pain in right shoulder: Secondary | ICD-10-CM | POA: Diagnosis not present

## 2022-07-13 MED ORDER — EZETIMIBE 10 MG PO TABS: ORAL_TABLET | ORAL | 3 refills | Status: DC

## 2022-07-13 NOTE — Therapy (Signed)
[place] EVALUATION  Patient Name: [name] MRN: [id number] DOB:[birth year], 77 y.o., male Today's Date: 07/13/2022  PCP: [name], MD REFERRING PROVIDER: Ophelia Charter, MD  END OF SESSION:  OT End of Session - 07/13/22 1047     Visit Number 1    Number of Visits 17    Date for OT Re-Evaluation 09/14/22    Authorization Type [employer]    OT Start Time 1035    OT Stop Time 1115    OT Time Calculation (min) 40 min    Activity Tolerance Patient tolerated treatment well    Behavior During Therapy [facility] for tasks assessed/performed             Past Medical History:  Diagnosis Date   Allergy to alpha-gal    2015; has been able to re-introduce red meat for the past 2 1/2 years without reaction (as of 01/21/19)   Arthritis    Chest pain    a. 03/2017: echo showing EF of 60-65%, no regional WMA or significant valve abnormalities. b. 03/2016: NST with no evidence of ischemia.    Complication of anesthesia    "anaphylactic" reaction to Xylocaine > 10 years ago; reported later recieved marcaine without issue   Difficult intubation 02/19/2018   No difficulty 01/26/19 with Mac and 3 or 11/10/19 with Glidescope   Digestive problems    on Prednisone prn for this issue- diarrhea   Dyspnea    pulmonary fibrosis   Dysrhythmia    irregular due to Myocarditis- takes Atenolol, Norvasc   GERD (gastroesophageal reflux disease)    H/O viral myocarditis    25 years ago   Head injury, closed, with concussion    Breif LOC   Hyperlipidemia    Hypertension    Mild mitral valve prolapse    per Dr Evette Georges notes   PAF (paroxysmal atrial fibrillation) ([place])    a. initially occuring in 02/2016. b. recurrent in 07/2016. Placed on Eliquis   Polymyalgia rheumatica (HCC)    PONV (postoperative nausea and vomiting)    Pre-diabetes    elevated due to high dose of prednsione for back no longer elevated   Pulmonary fibrosis (HCC)    Sleep apnea    no  cpap mild   Past Surgical History:  Procedure Laterality Date   ANTERIOR CERVICAL DECOMP/DISCECTOMY FUSION N/A 11/10/2019   Procedure: Cervical Three-Four Anterior cervical decompression/discectomy/fusion;  Surgeon: [name], MD;  Location: [facility];  Service: Neurosurgery;  Laterality: N/A;  Cervical Three-Four Anterior cervical decompression/discectomy/fusion   apendectomy  1965   APPENDECTOMY     APPLICATION OF ROBOTIC ASSISTANCE FOR SPINAL PROCEDURE N/A 12/22/2020   Procedure: APPLICATION OF ROBOTIC ASSISTANCE FOR SPINAL PROCEDURE;  Surgeon: [name], MD;  Location: [facility];  Service: Neurosurgery;  Laterality: N/A;   BACK SURGERY     bone spur Bilateral 1999   feet   CARDIAC CATHETERIZATION  2001   CHOLECYSTECTOMY  2004   COLON RESECTION N/A 06/15/2018   Procedure: SIGMOID COLON RESECTION;  Surgeon: [name], MD;  Location: [facility];  Service: General;  Laterality: N/A;   COLONOSCOPY     COLOSTOMY N/A 06/15/2018   Procedure: COLOSTOMY;  Surgeon: [name], MD;  Location: [facility];  Service: General;  Laterality: N/A;   COLOSTOMY TAKEDOWN N/A 01/26/2019   Procedure: LAPAROSCOPIC ASSISTED COLOSTOMY TAKEDOWN;  Surgeon: [name], MD;  Location: [facility];  Service: General;  Laterality: N/A;   EYE SURGERY  Bilateral    cataract removal   LAPAROSCOPIC LYSIS OF ADHESIONS N/A 01/26/2019   Procedure: LAPAROSCOPIC LYSIS OF ADHESIONS;  Surgeon: [name], MD;  Location: [place];  Service: General;  Laterality: N/A;   LAPAROTOMY N/A 06/15/2018   Procedure: EXPLORATORY LAPAROTOMY;  Surgeon: [name], MD;  Location[facility];  Service: General;  Laterality: N/A;   LUMBAR LAMINECTOMY/ DECOMPRESSION WITH MET-RX Right 05/05/2013   Procedure: Right Lumbar three-four Extraforaminal Microdiskectomy with Metrex;  Surgeon: [name], MD;  Location: [facility];  Service: Neurosurgery;  Laterality: Right;  Right Lumbar three-four Extraforaminal Microdiskectomy with Metrex   LUNG BIOPSY  Right 02/19/2018   Procedure: LUNG BIOPSY;  Surgeon: [name], MD;  Location: [facility];  Service: Thoracic;  Laterality: Right;   POSTERIOR LUMBAR FUSION 4 LEVEL N/A 12/22/2020   Procedure: Lumbar one-two Posterior lumbar interbody fusion with stabilization from Thoracic ten to Lumbar one with robotic screw placement;  Surgeon: [name], MD;  Location: [facility];  Service: Neurosurgery;  Laterality: N/A;  RM 20   REVERSE SHOULDER ARTHROPLASTY Right 07/11/2022   Procedure: REVERSE SHOULDER ARTHROPLASTY;  Surgeon: Hiram Gash, MD;  Location[facility];  Service: Orthopedics;  Laterality: Right;   SHOULDER OPEN ROTATOR CUFF REPAIR Bilateral 2001   TONSILLECTOMY     VIDEO ASSISTED THORACOSCOPY Right 02/19/2018   Procedure: VIDEO ASSISTED THORACOSCOPY;  Surgeon: [name], MD;  [facility];  Service: Thoracic;  Laterality: Right;   Patient Active Problem List   Diagnosis Date Noted   Spinal stenosis of lumbar region with neurogenic claudication 12/22/2020   Pre-operative respiratory examination 12/13/2020   Cervical spondylosis with myelopathy 11/10/2019   Shortness of breath 08/04/2019   Colostomy in place ([place]) 01/26/2019   Prolonged Q-T interval on ECG 06/25/2018   Immunosuppression due to drug therapy for ILD 06/16/2018   Sigmoid colon perforation s/p Hartmann colectomy/colostomy 06/15/2018 06/15/2018   Perforated diverticulum of large intestine 06/15/2018   OSA (obstructive sleep apnea) 12/13/2016   Snoring 10/26/2016   PAF (paroxysmal atrial fibrillation) ([place]) 08/14/2016   ILD (interstitial lung disease) ([place]) 05/01/2016   Lumbar stenosis 02/01/2015   Giardia 04/14/2014   H/O viral cardiomyopathy 04/14/2014   Herniated nucleus pulposus, L3-4 right 05/05/2013   Other hypertrophic cardiomyopathy ([place]) 04/19/2013   Hyperlipidemia 04/19/2013   Essential hypertension 04/19/2013   Abnormal LFTs 09/16/2012   Nausea 07/10/2011   Chronic diarrhea 07/10/2011   Burping  07/10/2011   Osteoporosis 07/10/2011   Arthritis 07/10/2011    ONSET DATE: 07/11/22  REFERRING DIAG: R Reverse Total Shoulder Arthoscopy  THERAPY DIAG:  No diagnosis found.  Rationale for Evaluation and Treatment: Rehabilitation  SUBJECTIVE:   SUBJECTIVE STATEMENT: "So far I'm feeling great" Pt accompanied by: self and significant other  PERTINENT HISTORY: Patient reports medical history significant for pulmonary fibrosis, interstitial lung disease.   PRECAUTIONS: Shoulder  WEIGHT BEARING RESTRICTIONS: Yes No Weight Bearing  PAIN:  Are you having pain? No  FALLS: Has patient fallen in last 6 months? Yes. Number of falls 1 - tripping over a stool  LIVING ENVIRONMENT: Lives with: lives with their spouse Lives in: House/apartment Stairs: Yes: External: 1 steps; none  PLOF: Independent  PATIENT GOALS: "To get ROM back in the arm"  NEXT MD VISIT: 07/20/22  OBJECTIVE:   HAND DOMINANCE: Right  ADLs: Overall ADLs: Pt requiring mod-max assist for all ADL's and IADL's at this time. He is unable to lift items or sustain holds on items  due to weight.   FUNCTIONAL OUTCOME MEASURES: FOTO: 34.11  UPPER EXTREMITY ROM:     Passive ROM Right eval  Shoulder flexion 123  Shoulder abduction 115  Shoulder internal rotation 90  Shoulder external rotation -9  (Blank rows = not tested)  UPPER EXTREMITY MMT:     MMT Right eval  Shoulder flexion   Shoulder abduction   Shoulder adduction   Shoulder extension   Shoulder internal rotation   Shoulder external rotation   (Blank rows = not tested)  SENSATION: WFL  EDEMA: Mild to moderate swelling noted around surgical area  OBSERVATIONS: Severe Fascial restrictions noted in subscapularis, biceps, trapezius, and axillary region.   TODAY'S TREATMENT:                                                                                                                              DATE: 07/13/22: Evaluation Only    PATIENT  EDUCATION: Education details: Pendulums Person educated: Patient Education method: Explanation, Demonstration, and Handouts Education comprehension: verbalized understanding and returned demonstration  HOME EXERCISE PROGRAM: 3/1: Pendulums  GOALS: Goals reviewed with patient? Yes  SHORT TERM GOALS: Target date: 08/17/22  Pt will be provided with and educated on HEP to improve mobility in RUE required for use during ADL completion.  Goal status: INITIAL  2.   Pt will increase RUE P/ROM by 40 degrees or greater to improve ability to use LUE during dressing tasks with minimal compensatory techniques.  Goal status: INITIAL  3.  Pt will increase LUE strength to 3+/5 to improve ability to reach for items at waist to chest height during bathing and grooming tasks.  Goal status: INITIAL   LONG TERM GOALS: Target date: 09/14/22  Pt will decrease pain in RUE to 3/10 or less to improve ability to sleep for 2+ consecutive hours without waking due to pain.  Goal status: INITIAL  2.  Pt will decrease RUE fascial restrictions to min amounts or less to improve mobility required for functional reaching tasks.  Goal status: INITIAL  3.  Pt will increase RUE A/ROM to at least 145 for flexion/abduction, and 50 degrees er/IR to improve ability to use RUE when reaching overhead or behind back during dressing and bathing tasks.  Goal status: INITIAL  4.  Pt will increase LUE strength to 4+/5 or greater to improve ability to use LUE when lifting or carrying items during meal preparation/housework/yardwork tasks. Goal status: INITIAL  5.  Pt will return to highest level of function using LUE as non-dominant during functional task completion.  Goal status: INITIAL   ASSESSMENT:  CLINICAL IMPRESSION: Patient is a 77 y.o. male who was seen today for occupational therapy evaluation for s/p R Reverse Total Shoulder. Pt requiring mod-max assist for all ADL's and IADL's due to protocol, pain, and ROM  limitations.   PERFORMANCE DEFICITS: in functional skills including ADLs, IADLs, edema, ROM, strength, pain, fascial restrictions, Gross motor control, body mechanics, and  UE functional use.  IMPAIRMENTS: are limiting patient from ADLs, IADLs, rest and sleep, work, leisure, and social participation.   COMORBIDITIES: has no other co-morbidities that affects occupational performance. Patient will benefit from skilled OT to address above impairments and improve overall function.  MODIFICATION OR ASSISTANCE TO COMPLETE EVALUATION: No modification of tasks or assist necessary to complete an evaluation.  OT OCCUPATIONAL PROFILE AND HISTORY: Problem focused assessment: Including review of records relating to presenting problem.  CLINICAL DECISION MAKING: LOW - limited treatment options, no task modification necessary  REHAB POTENTIAL: Excellent  EVALUATION COMPLEXITY: Low      PLAN:  OT FREQUENCY: 2x/week  OT DURATION: 8 weeks  PLANNED INTERVENTIONS: self care/ADL training, therapeutic exercise, therapeutic activity, manual therapy, scar mobilization, passive range of motion, functional mobility training, electrical stimulation, ultrasound, moist heat, cryotherapy, patient/family education, energy conservation, coping strategies training, and DME and/or AE instructions  RECOMMENDED OTHER SERVICES: N/A  CONSULTED AND AGREED WITH PLAN OF CARE: Patient  PLAN FOR NEXT SESSION: Manual Therapy, P/ROM, Thumb tacs, Table slides   Paulita Fujita, OTR/L [facility] [place], [place] 07/13/2022, 10:48 AM

## 2022-07-17 ENCOUNTER — Ambulatory Visit (HOSPITAL_COMMUNITY): Payer: PPO | Admitting: Occupational Therapy

## 2022-07-17 ENCOUNTER — Encounter (HOSPITAL_COMMUNITY): Payer: Self-pay | Admitting: Occupational Therapy

## 2022-07-17 DIAGNOSIS — R29898 Other symptoms and signs involving the musculoskeletal system: Secondary | ICD-10-CM

## 2022-07-17 DIAGNOSIS — M25611 Stiffness of right shoulder, not elsewhere classified: Secondary | ICD-10-CM

## 2022-07-17 DIAGNOSIS — M25511 Pain in right shoulder: Secondary | ICD-10-CM | POA: Diagnosis not present

## 2022-07-17 NOTE — Therapy (Unsigned)
OUTPATIENT OCCUPATIONAL THERAPY ORTHO TREATMENT NOTE  Patient Name: [name] MRN: [id number] DOB:Sep 14, 1945, 77 y.o., male Today's Date: 07/17/2022  PCP: [name], MD REFERRING PROVIDER: Ophelia Charter, MD  END OF SESSION:  OT End of Session - 07/17/22 1114     Visit Number 2    Number of Visits 17    Date for OT Re-Evaluation 09/14/22    Authorization Type [employer]    Progress Note Due on Visit 10    OT Start Time 1035    OT Stop Time 1115    OT Time Calculation (min) 40 min    Activity Tolerance Patient tolerated treatment well    Behavior During Therapy [facility] for tasks assessed/performed             Past Medical History:  Diagnosis Date   Allergy to alpha-gal    2015; has been able to re-introduce red meat for the past 2 1/2 years without reaction (as of 01/21/19)   Arthritis    Chest pain    a. 03/2017: echo showing EF of 60-65%, no regional WMA or significant valve abnormalities. b. 03/2016: NST with no evidence of ischemia.    Complication of anesthesia    "anaphylactic" reaction to Xylocaine > 10 years ago; reported later recieved marcaine without issue   Difficult intubation 02/19/2018   No difficulty 01/26/19 with Mac and 3 or 11/10/19 with Glidescope   Digestive problems    on Prednisone prn for this issue- diarrhea   Dyspnea    pulmonary fibrosis   Dysrhythmia    irregular due to Myocarditis- takes Atenolol, Norvasc   GERD (gastroesophageal reflux disease)    H/O viral myocarditis    25 years ago   Head injury, closed, with concussion    Breif LOC   Hyperlipidemia    Hypertension    Mild mitral valve prolapse    per Dr Evette Georges notes   PAF (paroxysmal atrial fibrillation) ([place])    a. initially occuring in 02/2016. b. recurrent in 07/2016. Placed on Eliquis   Polymyalgia rheumatica (HCC)    PONV (postoperative nausea and vomiting)    Pre-diabetes    elevated due to high dose of prednsione for back no longer elevated   Pulmonary  fibrosis (HCC)    Sleep apnea    no cpap mild   Past Surgical History:  Procedure Laterality Date   ANTERIOR CERVICAL DECOMP/DISCECTOMY FUSION N/A 11/10/2019   Procedure: Cervical Three-Four Anterior cervical decompression/discectomy/fusion;  Surgeon: [name], MD;  Location: [place];  Service: Neurosurgery;  Laterality: N/A;  Cervical Three-Four Anterior cervical decompression/discectomy/fusion   apendectomy  1965   APPENDECTOMY     APPLICATION OF ROBOTIC ASSISTANCE FOR SPINAL PROCEDURE N/A 12/22/2020   Procedure: APPLICATION OF ROBOTIC ASSISTANCE FOR SPINAL PROCEDURE;  Surgeon: [name], MD;  Location: [place];  Service: Neurosurgery;  Laterality: N/A;   BACK SURGERY     bone spur Bilateral 1999   feet   CARDIAC CATHETERIZATION  2001   CHOLECYSTECTOMY  2004   COLON RESECTION N/A 06/15/2018   Procedure: SIGMOID COLON RESECTION;  Surgeon: [name], MD;  [facility];  Service: General;  Laterality: N/A;   COLONOSCOPY     COLOSTOMY N/A 06/15/2018   Procedure: COLOSTOMY;  Surgeon: [name], MD;  [facility];  Service: General;  Laterality: N/A;   COLOSTOMY TAKEDOWN N/A 01/26/2019   Procedure: LAPAROSCOPIC ASSISTED COLOSTOMY TAKEDOWN;  Surgeon: [name], MD;  Location: [place];  Service: General;  Laterality: N/A;   EYE SURGERY Bilateral    cataract removal   LAPAROSCOPIC LYSIS OF ADHESIONS N/A 01/26/2019   Procedure: LAPAROSCOPIC LYSIS OF ADHESIONS;  Surgeon: [name], MD;  Location: [place];  Service: General;  Laterality: N/A;   LAPAROTOMY N/A 06/15/2018   Procedure: EXPLORATORY LAPAROTOMY;  Surgeon: [name], MD;  [facility];  Service: General;  Laterality: N/A;   LUMBAR LAMINECTOMY/ DECOMPRESSION WITH MET-RX Right 05/05/2013   Procedure: Right Lumbar three-four Extraforaminal Microdiskectomy with Metrex;  Surgeon: [name], MD;  Location: [place] [facility];  Service: Neurosurgery;  Laterality: Right;  Right Lumbar three-four Extraforaminal  Microdiskectomy with Metrex   LUNG BIOPSY Right 02/19/2018   Procedure: LUNG BIOPSY;  Surgeon: [name], MD;  Location: [place];  Service: Thoracic;  Laterality: Right;   POSTERIOR LUMBAR FUSION 4 LEVEL N/A 12/22/2020   Procedure: Lumbar one-two Posterior lumbar interbody fusion with stabilization from Thoracic ten to Lumbar one with robotic screw placement;  Surgeon: [name], MD;  Location: [place];  Service: Neurosurgery;  Laterality: N/A;  RM 20   REVERSE SHOULDER ARTHROPLASTY Right 07/11/2022   Procedure: REVERSE SHOULDER ARTHROPLASTY;  Surgeon: Hiram Gash, MD;  [facility];  Service: Orthopedics;  Laterality: Right;   SHOULDER OPEN ROTATOR CUFF REPAIR Bilateral 2001   TONSILLECTOMY     VIDEO ASSISTED THORACOSCOPY Right 02/19/2018   Procedure: VIDEO ASSISTED THORACOSCOPY;  Surgeon: [name], MD;  Location: [facility] OR;  Service: Thoracic;  Laterality: Right;   Patient Active Problem List   Diagnosis Date Noted   Spinal stenosis of lumbar region with neurogenic claudication 12/22/2020   Pre-operative respiratory examination 12/13/2020   Cervical spondylosis with myelopathy 11/10/2019   Shortness of breath 08/04/2019   Colostomy in place ([place]) 01/26/2019   Prolonged Q-T interval on ECG 06/25/2018   Immunosuppression due to drug therapy for ILD 06/16/2018   Sigmoid colon perforation s/p Hartmann colectomy/colostomy 06/15/2018 06/15/2018   Perforated diverticulum of large intestine 06/15/2018   OSA (obstructive sleep apnea) 12/13/2016   Snoring 10/26/2016   PAF (paroxysmal atrial fibrillation) ([place]) 08/14/2016   ILD (interstitial lung disease) ([place]) 05/01/2016   Lumbar stenosis 02/01/2015   Giardia 04/14/2014   H/O viral cardiomyopathy 04/14/2014   Herniated nucleus pulposus, L3-4 right 05/05/2013   Other hypertrophic cardiomyopathy ([place]) 04/19/2013   Hyperlipidemia 04/19/2013   Essential hypertension 04/19/2013   Abnormal LFTs 09/16/2012   Nausea 07/10/2011    Chronic diarrhea 07/10/2011   Burping 07/10/2011   Osteoporosis 07/10/2011   Arthritis 07/10/2011    ONSET DATE: 07/11/22  REFERRING DIAG: R Reverse Total Shoulder Arthoscopy  THERAPY DIAG:  Acute pain of right shoulder  Shoulder stiffness, right  Other symptoms and signs involving the musculoskeletal system  Rationale for Evaluation and Treatment: Rehabilitation  SUBJECTIVE:   SUBJECTIVE STATEMENT: "Not feeling too bad lately" Pt accompanied by: self and significant other  PERTINENT HISTORY: Patient reports medical history significant for pulmonary fibrosis, interstitial lung disease.   PRECAUTIONS: Shoulder  WEIGHT BEARING RESTRICTIONS: Yes No Weight Bearing  PAIN:  Are you having pain? No  FALLS: Has patient fallen in last 6 months? Yes. Number of falls 1 - tripping over a stool  PATIENT GOALS: "To get ROM back in the arm"  NEXT MD VISIT: 07/20/22  OBJECTIVE:   HAND DOMINANCE: Right  ADLs: Overall ADLs: Pt requiring mod-max assist for all ADL's and IADL's at this time. He is unable to lift items or sustain  holds on items due to weight.   FUNCTIONAL OUTCOME MEASURES: FOTO: 34.11  UPPER EXTREMITY ROM:     Passive ROM Right eval  Shoulder flexion 123  Shoulder abduction 115  Shoulder internal rotation 90  Shoulder external rotation -9  (Blank rows = not tested)  UPPER EXTREMITY MMT:     MMT Right eval  Shoulder flexion   Shoulder abduction   Shoulder adduction   Shoulder extension   Shoulder internal rotation   Shoulder external rotation   (Blank rows = not tested)  SENSATION: WFL  EDEMA: Mild to moderate swelling noted around surgical area  OBSERVATIONS: Severe Fascial restrictions noted in subscapularis, biceps, trapezius, and axillary region.   TODAY'S TREATMENT:                                                                                                                              DATE:   07/17/22 -Manual therapy: myofascial  release and trigger point applied to biceps, trapezius, scapula, and axillary region in order to address pain and fascial restrictions to improve ROM. -P/ROM: flexion, abduction, horizontal abduction, and er, x15 -[employer]: flexion, abduction in scaption, x10   PATIENT EDUCATION: Education details: Table Slides Person educated: Patient Education method: Explanation, Demonstration, and Handouts Education comprehension: verbalized understanding and returned demonstration  HOME EXERCISE PROGRAM: 3/1: Pendulums 3/5: Table Slides  GOALS: Goals reviewed with patient? Yes  SHORT TERM GOALS: Target date: 08/17/22  Pt will be provided with and educated on HEP to improve mobility in RUE required for use during ADL completion.  Goal status: IN PROGRESS  2.   Pt will increase RUE P/ROM by 40 degrees or greater to improve ability to use LUE during dressing tasks with minimal compensatory techniques.  Goal status: IN PROGRESS  3.  Pt will increase LUE strength to 3+/5 to improve ability to reach for items at waist to chest height during bathing and grooming tasks.  Goal status: IN PROGRESS   LONG TERM GOALS: Target date: 09/14/22  Pt will decrease pain in RUE to 3/10 or less to improve ability to sleep for 2+ consecutive hours without waking due to pain.  Goal status: IN PROGRESS  2.  Pt will decrease RUE fascial restrictions to min amounts or less to improve mobility required for functional reaching tasks.  Goal status: IN PROGRESS  3.  Pt will increase RUE A/ROM to at least 145 for flexion/abduction, and 50 degrees er/IR to improve ability to use RUE when reaching overhead or behind back during dressing and bathing tasks.  Goal status: IN PROGRESS  4.  Pt will increase LUE strength to 4+/5 or greater to improve ability to use LUE when lifting or carrying items during meal preparation/housework/yardwork tasks. Goal status: IN PROGRESS  5.  Pt will return to highest level of function  using LUE as non-dominant during functional task completion.  Goal status: IN PROGRESS   ASSESSMENT:  CLINICAL IMPRESSION: Pt presenting to  his first therapy session, where manual therapy, P/ROM, and Table Slides were initiated. He reports moderate pain in end ranges with P/ROM and ball rolls. Pt following phase 1 of his protocol, remaining in his sling 24/7 except during showers and exercises. Verbal cuing provided during ball rolls, and total assist for manual and P/ROM.   PERFORMANCE DEFICITS: in functional skills including ADLs, IADLs, edema, ROM, strength, pain, fascial restrictions, Gross motor control, body mechanics, and UE functional use.   PLAN:  OT FREQUENCY: 2x/week  OT DURATION: 8 weeks  PLANNED INTERVENTIONS: self care/ADL training, therapeutic exercise, therapeutic activity, manual therapy, scar mobilization, passive range of motion, functional mobility training, electrical stimulation, ultrasound, moist heat, cryotherapy, patient/family education, energy conservation, coping strategies training, and DME and/or AE instructions  RECOMMENDED OTHER SERVICES: N/A  CONSULTED AND AGREED WITH PLAN OF CARE: Patient  PLAN FOR NEXT SESSION: Manual Therapy, P/ROM, Thumb tacs, Table slides, low level wall washes   Paulita Fujita, OTR/L [place], [place] 07/17/2022, 11:15 AM

## 2022-07-17 NOTE — Patient Instructions (Signed)

## 2022-07-19 ENCOUNTER — Encounter (HOSPITAL_COMMUNITY): Payer: Self-pay | Admitting: Occupational Therapy

## 2022-07-19 ENCOUNTER — Ambulatory Visit (HOSPITAL_COMMUNITY): Payer: PPO | Admitting: Occupational Therapy

## 2022-07-19 DIAGNOSIS — M25511 Pain in right shoulder: Secondary | ICD-10-CM | POA: Diagnosis not present

## 2022-07-19 DIAGNOSIS — R29898 Other symptoms and signs involving the musculoskeletal system: Secondary | ICD-10-CM

## 2022-07-19 DIAGNOSIS — M25611 Stiffness of right shoulder, not elsewhere classified: Secondary | ICD-10-CM

## 2022-07-19 NOTE — Therapy (Signed)
OUTPATIENT OCCUPATIONAL THERAPY ORTHO TREATMENT NOTE  Patient Name: Enrico [place]a MRN: [id number] DOB:Sep 03, 1945, 77 y.o., male Today's Date: 07/19/2022  PCP: [name], MD REFERRING PROVIDER: Ophelia Charter, MD  END OF SESSION:  OT End of Session - 07/19/22 1033     Visit Number 3    Number of Visits 17    Date for OT Re-Evaluation 09/14/22    Authorization Type [employer]    Progress Note Due on Visit 10    OT Start Time 1035    OT Stop Time 1115    OT Time Calculation (min) 40 min    Activity Tolerance Patient tolerated treatment well    Behavior During Therapy [facility] for tasks assessed/performed             Past Medical History:  Diagnosis Date   Allergy to alpha-gal    2015; has been able to re-introduce red meat for the past 2 1/2 years without reaction (as of 01/21/19)   Arthritis    Chest pain    a. 03/2017: echo showing EF of 60-65%, no regional WMA or significant valve abnormalities. b. 03/2016: NST with no evidence of ischemia.    Complication of anesthesia    "anaphylactic" reaction to Xylocaine > 10 years ago; reported later recieved marcaine without issue   Difficult intubation 02/19/2018   No difficulty 01/26/19 with Mac and 3 or 11/10/19 with Glidescope   Digestive problems    on Prednisone prn for this issue- diarrhea   Dyspnea    pulmonary fibrosis   Dysrhythmia    irregular due to Myocarditis- takes Atenolol, Norvasc   GERD (gastroesophageal reflux disease)    H/O viral myocarditis    25 years ago   Head injury, closed, with concussion    Breif LOC   Hyperlipidemia    Hypertension    Mild mitral valve prolapse    per Dr Evette Georges notes   PAF (paroxysmal atrial fibrillation) ([place])    a. initially occuring in 02/2016. b. recurrent in 07/2016. Placed on Eliquis   Polymyalgia rheumatica (HCC)    PONV (postoperative nausea and vomiting)    Pre-diabetes    elevated due to high dose of prednsione for back no longer elevated   Pulmonary  fibrosis (HCC)    Sleep apnea    no cpap mild   Past Surgical History:  Procedure Laterality Date   ANTERIOR CERVICAL DECOMP/DISCECTOMY FUSION N/A 11/10/2019   Procedure: Cervical Three-Four Anterior cervical decompression/discectomy/fusion;  Surgeon: [name], MD;  Location: [place];  Service: Neurosurgery;  Laterality: N/A;  Cervical Three-Four Anterior cervical decompression/discectomy/fusion   apendectomy  1965   APPENDECTOMY     APPLICATION OF ROBOTIC ASSISTANCE FOR SPINAL PROCEDURE N/A 12/22/2020   Procedure: APPLICATION OF ROBOTIC ASSISTANCE FOR SPINAL PROCEDURE;  Surgeon: [name], MD;  Location: [place];  Service: Neurosurgery;  Laterality: N/A;   BACK SURGERY     bone spur Bilateral 1999   feet   CARDIAC CATHETERIZATION  2001   CHOLECYSTECTOMY  2004   COLON RESECTION N/A 06/15/2018   Procedure: SIGMOID COLON RESECTION;  Surgeon: [name], MD;  [facility];  Service: General;  Laterality: N/A;   COLONOSCOPY     COLOSTOMY N/A 06/15/2018   Procedure: COLOSTOMY;  Surgeon: [name], MD;  [facility];  Service: General;  Laterality: N/A;   COLOSTOMY TAKEDOWN N/A 01/26/2019   Procedure: LAPAROSCOPIC ASSISTED COLOSTOMY TAKEDOWN;  Surgeon: [name], MD;  Location: [place];  Service: General;  Laterality: N/A;   EYE SURGERY Bilateral    cataract removal   LAPAROSCOPIC LYSIS OF ADHESIONS N/A 01/26/2019   Procedure: LAPAROSCOPIC LYSIS OF ADHESIONS;  Surgeon: [name], MD;  Location: [place];  Service: General;  Laterality: N/A;   LAPAROTOMY N/A 06/15/2018   Procedure: EXPLORATORY LAPAROTOMY;  Surgeon: [name], MD;  [facility];  Service: General;  Laterality: N/A;   LUMBAR LAMINECTOMY/ DECOMPRESSION WITH MET-RX Right 05/05/2013   Procedure: Right Lumbar three-four Extraforaminal Microdiskectomy with Metrex;  Surgeon: [name], MD;  Location: [place] [facility];  Service: Neurosurgery;  Laterality: Right;  Right Lumbar three-four Extraforaminal  Microdiskectomy with Metrex   LUNG BIOPSY Right 02/19/2018   Procedure: LUNG BIOPSY;  Surgeon: [name], MD;  Location: [place];  Service: Thoracic;  Laterality: Right;   POSTERIOR LUMBAR FUSION 4 LEVEL N/A 12/22/2020   Procedure: Lumbar one-two Posterior lumbar interbody fusion with stabilization from Thoracic ten to Lumbar one with robotic screw placement;  Surgeon: [name], MD;  Location: [place];  Service: Neurosurgery;  Laterality: N/A;  RM 20   REVERSE SHOULDER ARTHROPLASTY Right 07/11/2022   Procedure: REVERSE SHOULDER ARTHROPLASTY;  Surgeon: Hiram Gash, MD;  [facility];  Service: Orthopedics;  Laterality: Right;   SHOULDER OPEN ROTATOR CUFF REPAIR Bilateral 2001   TONSILLECTOMY     VIDEO ASSISTED THORACOSCOPY Right 02/19/2018   Procedure: VIDEO ASSISTED THORACOSCOPY;  Surgeon: [name], MD;  Location: [facility];  Service: Thoracic;  Laterality: Right;   Patient Active Problem List   Diagnosis Date Noted   Spinal stenosis of lumbar region with neurogenic claudication 12/22/2020   Pre-operative respiratory examination 12/13/2020   Cervical spondylosis with myelopathy 11/10/2019   Shortness of breath 08/04/2019   Colostomy in place ([place]) 01/26/2019   Prolonged Q-T interval on ECG 06/25/2018   Immunosuppression due to drug therapy for ILD 06/16/2018   Sigmoid colon perforation s/p Hartmann colectomy/colostomy 06/15/2018 06/15/2018   Perforated diverticulum of large intestine 06/15/2018   OSA (obstructive sleep apnea) 12/13/2016   Snoring 10/26/2016   PAF (paroxysmal atrial fibrillation) ([place]) 08/14/2016   ILD (interstitial lung disease) ([place]) 05/01/2016   Lumbar stenosis 02/01/2015   Giardia 04/14/2014   H/O viral cardiomyopathy 04/14/2014   Herniated nucleus pulposus, L3-4 right 05/05/2013   Other hypertrophic cardiomyopathy ([place]) 04/19/2013   Hyperlipidemia 04/19/2013   Essential hypertension 04/19/2013   Abnormal LFTs 09/16/2012   Nausea 07/10/2011    Chronic diarrhea 07/10/2011   Burping 07/10/2011   Osteoporosis 07/10/2011   Arthritis 07/10/2011    ONSET DATE: 07/11/22  REFERRING DIAG: R Reverse Total Shoulder Arthoscopy  THERAPY DIAG:  Acute pain of right shoulder  Shoulder stiffness, right  Other symptoms and signs involving the musculoskeletal system  Rationale for Evaluation and Treatment: Rehabilitation  SUBJECTIVE:   SUBJECTIVE STATEMENT: "Not feeling too bad lately" Pt accompanied by: self and significant other  PERTINENT HISTORY: Patient reports medical history significant for pulmonary fibrosis, interstitial lung disease.   PRECAUTIONS: Shoulder  WEIGHT BEARING RESTRICTIONS: Yes No Weight Bearing  PAIN:  Are you having pain? No  FALLS: Has patient fallen in last 6 months? Yes. Number of falls 1 - tripping over a stool  PATIENT GOALS: "To get ROM back in the arm"  NEXT MD VISIT: 07/20/22  OBJECTIVE:   HAND DOMINANCE: Right  ADLs: Overall ADLs: Pt requiring mod-max assist for all ADL's and IADL's at this time. He is unable to lift items or sustain  holds on items due to weight.   FUNCTIONAL OUTCOME MEASURES: FOTO: 34.11  UPPER EXTREMITY ROM:     Passive ROM Right eval  Shoulder flexion 123  Shoulder abduction 115  Shoulder internal rotation 90  Shoulder external rotation -9  (Blank rows = not tested)  UPPER EXTREMITY MMT:     MMT Right eval  Shoulder flexion   Shoulder abduction   Shoulder adduction   Shoulder extension   Shoulder internal rotation   Shoulder external rotation   (Blank rows = not tested)  SENSATION: WFL  EDEMA: Mild to moderate swelling noted around surgical area  OBSERVATIONS: Severe Fascial restrictions noted in subscapularis, biceps, trapezius, and axillary region.   TODAY'S TREATMENT:                                                                                                                              DATE:   07/19/22 -Manual therapy: myofascial  release and trigger point applied to biceps, trapezius, scapula, and axillary region in order to address pain and fascial restrictions to improve ROM. -P/ROM: flexion, abduction, horizontal abduction, and er/IR, x15 -Thumbtacs: 2x45" -Table Slides: flexion and abduction, x10 each  07/17/22 -Manual therapy: myofascial release and trigger point applied to biceps, trapezius, scapula, and axillary region in order to address pain and fascial restrictions to improve ROM. -P/ROM: flexion, abduction, horizontal abduction, and er, x15 -[employer]: flexion, abduction in scaption, x10   PATIENT EDUCATION: Education details: Continue HEP Person educated: Patient Education method: Consulting civil engineer, Demonstration, and Handouts Education comprehension: verbalized understanding and returned demonstration  HOME EXERCISE PROGRAM: 3/1: Pendulums 3/5: Table Slides  GOALS: Goals reviewed with patient? Yes  SHORT TERM GOALS: Target date: 08/17/22  Pt will be provided with and educated on HEP to improve mobility in RUE required for use during ADL completion.  Goal status: IN PROGRESS  2.   Pt will increase RUE P/ROM by 40 degrees or greater to improve ability to use LUE during dressing tasks with minimal compensatory techniques.  Goal status: IN PROGRESS  3.  Pt will increase LUE strength to 3+/5 to improve ability to reach for items at waist to chest height during bathing and grooming tasks.  Goal status: IN PROGRESS   LONG TERM GOALS: Target date: 09/14/22  Pt will decrease pain in RUE to 3/10 or less to improve ability to sleep for 2+ consecutive hours without waking due to pain.  Goal status: IN PROGRESS  2.  Pt will decrease RUE fascial restrictions to min amounts or less to improve mobility required for functional reaching tasks.  Goal status: IN PROGRESS  3.  Pt will increase RUE A/ROM to at least 145 for flexion/abduction, and 50 degrees er/IR to improve ability to use RUE when reaching overhead or  behind back during dressing and bathing tasks.  Goal status: IN PROGRESS  4.  Pt will increase LUE strength to 4+/5 or greater to improve ability to use LUE when  lifting or carrying items during meal preparation/housework/yardwork tasks. Goal status: IN PROGRESS  5.  Pt will return to highest level of function using LUE as non-dominant during functional task completion.  Goal status: IN PROGRESS   ASSESSMENT:  CLINICAL IMPRESSION: This session, pt continuing to work on ROM and OT providing manual therapy to reduce fascial restrictions. He continues to have increased pain/soreness throughout session with mobility, however his P/ROM is improving to 65-70% of full ROM this session with incremental progression each repetition of P/ROM. OT providing verbal and visual cuing for technique during thumbtac exercise and table slides.   PERFORMANCE DEFICITS: in functional skills including ADLs, IADLs, edema, ROM, strength, pain, fascial restrictions, Gross motor control, body mechanics, and UE functional use.   PLAN:  OT FREQUENCY: 2x/week  OT DURATION: 8 weeks  PLANNED INTERVENTIONS: self care/ADL training, therapeutic exercise, therapeutic activity, manual therapy, scar mobilization, passive range of motion, functional mobility training, electrical stimulation, ultrasound, moist heat, cryotherapy, patient/family education, energy conservation, coping strategies training, and DME and/or AE instructions  RECOMMENDED OTHER SERVICES: N/A  CONSULTED AND AGREED WITH PLAN OF CARE: Patient  PLAN FOR NEXT SESSION: Manual Therapy, P/ROM, Thumb tacs, Table slides, low level wall washes   Paulita Fujita, OTR/L [facility] [place], [place] 07/19/2022, 10:34 AM

## 2022-07-20 DIAGNOSIS — M19011 Primary osteoarthritis, right shoulder: Secondary | ICD-10-CM | POA: Diagnosis not present

## 2022-07-24 ENCOUNTER — Encounter (HOSPITAL_COMMUNITY): Payer: Self-pay | Admitting: Occupational Therapy

## 2022-07-24 ENCOUNTER — Ambulatory Visit (HOSPITAL_COMMUNITY): Payer: PPO | Admitting: Occupational Therapy

## 2022-07-24 DIAGNOSIS — R29898 Other symptoms and signs involving the musculoskeletal system: Secondary | ICD-10-CM

## 2022-07-24 DIAGNOSIS — M25511 Pain in right shoulder: Secondary | ICD-10-CM | POA: Diagnosis not present

## 2022-07-24 DIAGNOSIS — M25611 Stiffness of right shoulder, not elsewhere classified: Secondary | ICD-10-CM

## 2022-07-24 NOTE — Therapy (Signed)
OUTPATIENT OCCUPATIONAL THERAPY ORTHO TREATMENT NOTE  Patient Name: [name] MRN: [id number] DOB:[birth year], 77 y.o., male Today's Date: 07/24/2022  PCP: [name], MD REFERRING PROVIDER: Ophelia Charter, MD  END OF SESSION:  OT End of Session - 07/24/22 1034     Visit Number 4    Number of Visits 17    Date for OT Re-Evaluation 09/14/22    Authorization Type [employer]    Progress Note Due on Visit 10    OT Start Time 1035    OT Stop Time 1115    OT Time Calculation (min) 40 min    Activity Tolerance Patient tolerated treatment well    Behavior During Therapy [facility] for tasks assessed/performed             Past Medical History:  Diagnosis Date   Allergy to alpha-gal    2015; has been able to re-introduce red meat for the past 2 1/2 years without reaction (as of 01/21/19)   Arthritis    Chest pain    a. 03/2017: echo showing EF of 60-65%, no regional WMA or significant valve abnormalities. b. 03/2016: NST with no evidence of ischemia.    Complication of anesthesia    "anaphylactic" reaction to Xylocaine > 10 years ago; reported later recieved marcaine without issue   Difficult intubation 02/19/2018   No difficulty 01/26/19 with Mac and 3 or 11/10/19 with Glidescope   Digestive problems    on Prednisone prn for this issue- diarrhea   Dyspnea    pulmonary fibrosis   Dysrhythmia    irregular due to Myocarditis- takes Atenolol, Norvasc   GERD (gastroesophageal reflux disease)    H/O viral myocarditis    25 years ago   Head injury, closed, with concussion    Breif LOC   Hyperlipidemia    Hypertension    Mild mitral valve prolapse    per Dr Evette Georges notes   PAF (paroxysmal atrial fibrillation) ([place])    a. initially occuring in 02/2016. b. recurrent in 07/2016. Placed on Eliquis   Polymyalgia rheumatica (HCC)    PONV (postoperative nausea and vomiting)    Pre-diabetes    elevated due to high dose of prednsione for back no longer elevated    Pulmonary fibrosis (HCC)    Sleep apnea    no cpap mild   Past Surgical History:  Procedure Laterality Date   ANTERIOR CERVICAL DECOMP/DISCECTOMY FUSION N/A 11/10/2019   Procedure: Cervical Three-Four Anterior cervical decompression/discectomy/fusion;  Surgeon: [name], MD;  Location: [place];  Service: Neurosurgery;  Laterality: N/A;  Cervical Three-Four Anterior cervical decompression/discectomy/fusion   apendectomy  1965   APPENDECTOMY     APPLICATION OF ROBOTIC ASSISTANCE FOR SPINAL PROCEDURE N/A 12/22/2020   Procedure: APPLICATION OF ROBOTIC ASSISTANCE FOR SPINAL PROCEDURE;  Surgeon: [name], MD;  Location: [place];  Service: Neurosurgery;  Laterality: N/A;   BACK SURGERY     bone spur Bilateral 1999   feet   CARDIAC CATHETERIZATION  2001   CHOLECYSTECTOMY  2004   COLON RESECTION N/A 06/15/2018   Procedure: SIGMOID COLON RESECTION;  Surgeon: [name], MD;  [facility];  Service: General;  Laterality: N/A;   COLONOSCOPY     COLOSTOMY N/A 06/15/2018   Procedure: COLOSTOMY;  Surgeon: [name], MD;  [facility];  Service: General;  Laterality: N/A;   COLOSTOMY TAKEDOWN N/A 01/26/2019   Procedure: LAPAROSCOPIC ASSISTED COLOSTOMY TAKEDOWN;  Surgeon: [name], MD;  Location: [place];  Service: General;  Laterality: N/A;   EYE SURGERY Bilateral    cataract removal   LAPAROSCOPIC LYSIS OF ADHESIONS N/A 01/26/2019   Procedure: LAPAROSCOPIC LYSIS OF ADHESIONS;  Surgeon: [name], MD;  Location: [place];  Service: General;  Laterality: N/A;   LAPAROTOMY N/A 06/15/2018   Procedure: EXPLORATORY LAPAROTOMY;  Surgeon: [name], MD;  [facility];  Service: General;  Laterality: N/A;   LUMBAR LAMINECTOMY/ DECOMPRESSION WITH MET-RX Right 05/05/2013   Procedure: Right Lumbar three-four Extraforaminal Microdiskectomy with Metrex;  Surgeon: [name], MD;  Location: [place] [facility];  Service: Neurosurgery;  Laterality: Right;  Right Lumbar three-four  Extraforaminal Microdiskectomy with Metrex   LUNG BIOPSY Right 02/19/2018   Procedure: LUNG BIOPSY;  Surgeon: [name], MD;  Location: [place];  Service: Thoracic;  Laterality: Right;   POSTERIOR LUMBAR FUSION 4 LEVEL N/A 12/22/2020   Procedure: Lumbar one-two Posterior lumbar interbody fusion with stabilization from Thoracic ten to Lumbar one with robotic screw placement;  Surgeon: [name], MD;  Location: [place];  Service: Neurosurgery;  Laterality: N/A;  RM 20   REVERSE SHOULDER ARTHROPLASTY Right 07/11/2022   Procedure: REVERSE SHOULDER ARTHROPLASTY;  Surgeon: Hiram Gash, MD;  [facility];  Service: Orthopedics;  Laterality: Right;   SHOULDER OPEN ROTATOR CUFF REPAIR Bilateral 2001   TONSILLECTOMY     VIDEO ASSISTED THORACOSCOPY Right 02/19/2018   Procedure: VIDEO ASSISTED THORACOSCOPY;  Surgeon: [name], MD;  Location: [facility];  Service: Thoracic;  Laterality: Right;   Patient Active Problem List   Diagnosis Date Noted   Spinal stenosis of lumbar region with neurogenic claudication 12/22/2020   Pre-operative respiratory examination 12/13/2020   Cervical spondylosis with myelopathy 11/10/2019   Shortness of breath 08/04/2019   Colostomy in place ([place]) 01/26/2019   Prolonged Q-T interval on ECG 06/25/2018   Immunosuppression due to drug therapy for ILD 06/16/2018   Sigmoid colon perforation s/p Hartmann colectomy/colostomy 06/15/2018 06/15/2018   Perforated diverticulum of large intestine 06/15/2018   OSA (obstructive sleep apnea) 12/13/2016   Snoring 10/26/2016   PAF (paroxysmal atrial fibrillation) ([place]) 08/14/2016   ILD (interstitial lung disease) ([place]) 05/01/2016   Lumbar stenosis 02/01/2015   Giardia 04/14/2014   H/O viral cardiomyopathy 04/14/2014   Herniated nucleus pulposus, L3-4 right 05/05/2013   Other hypertrophic cardiomyopathy ([place]) 04/19/2013   Hyperlipidemia 04/19/2013   Essential hypertension 04/19/2013   Abnormal LFTs 09/16/2012    Nausea 07/10/2011   Chronic diarrhea 07/10/2011   Burping 07/10/2011   Osteoporosis 07/10/2011   Arthritis 07/10/2011    ONSET DATE: 07/11/22  REFERRING DIAG: R Reverse Total Shoulder Arthoscopy  THERAPY DIAG:  Acute pain of right shoulder  Shoulder stiffness, right  Other symptoms and signs involving the musculoskeletal system  Rationale for Evaluation and Treatment: Rehabilitation  SUBJECTIVE:   SUBJECTIVE STATEMENT: "Not feeling too bad lately" Pt accompanied by: self and significant other  PERTINENT HISTORY: Patient reports medical history significant for pulmonary fibrosis, interstitial lung disease.   PRECAUTIONS: Shoulder  WEIGHT BEARING RESTRICTIONS: Yes No Weight Bearing  PAIN:  Are you having pain? No  FALLS: Has patient fallen in last 6 months? Yes. Number of falls 1 - tripping over a stool  PATIENT GOALS: "To get ROM back in the arm"  NEXT MD VISIT: 07/20/22  OBJECTIVE:   HAND DOMINANCE: Right  ADLs: Overall ADLs: Pt requiring mod-max assist for all ADL's and IADL's at this time. He is unable to lift items or sustain  holds on items due to weight.   FUNCTIONAL OUTCOME MEASURES: FOTO: 34.11  UPPER EXTREMITY ROM:     Passive ROM Right eval  Shoulder flexion 123  Shoulder abduction 115  Shoulder internal rotation 90  Shoulder external rotation -9  (Blank rows = not tested)  UPPER EXTREMITY MMT:     MMT Right eval  Shoulder flexion   Shoulder abduction   Shoulder adduction   Shoulder extension   Shoulder internal rotation   Shoulder external rotation   (Blank rows = not tested)  SENSATION: WFL  EDEMA: Mild to moderate swelling noted around surgical area  OBSERVATIONS: Severe Fascial restrictions noted in subscapularis, biceps, trapezius, and axillary region.   TODAY'S TREATMENT:                                                                                                                              DATE:   07/19/22 -Manual  therapy: myofascial release and trigger point applied to biceps, trapezius, scapula, and axillary region in order to address pain and fascial restrictions to improve ROM. -P/ROM: flexion, abduction, horizontal abduction, and er/IR, x15 -Thumbtacs: 2x45" -Table Slides: flexion and abduction, x10 each  07/17/22 -Manual therapy: myofascial release and trigger point applied to biceps, trapezius, scapula, and axillary region in order to address pain and fascial restrictions to improve ROM. -P/ROM: flexion, abduction, horizontal abduction, and er, x15 -[employer]: flexion, abduction in scaption, x10   PATIENT EDUCATION: Education details: Continue HEP Person educated: Patient Education method: Consulting civil engineer, Demonstration, and Handouts Education comprehension: verbalized understanding and returned demonstration  HOME EXERCISE PROGRAM: 3/1: Pendulums 3/5: Table Slides  GOALS: Goals reviewed with patient? Yes  SHORT TERM GOALS: Target date: 08/17/22  Pt will be provided with and educated on HEP to improve mobility in RUE required for use during ADL completion.  Goal status: IN PROGRESS  2.   Pt will increase RUE P/ROM by 40 degrees or greater to improve ability to use LUE during dressing tasks with minimal compensatory techniques.  Goal status: IN PROGRESS  3.  Pt will increase LUE strength to 3+/5 to improve ability to reach for items at waist to chest height during bathing and grooming tasks.  Goal status: IN PROGRESS   LONG TERM GOALS: Target date: 09/14/22  Pt will decrease pain in RUE to 3/10 or less to improve ability to sleep for 2+ consecutive hours without waking due to pain.  Goal status: IN PROGRESS  2.  Pt will decrease RUE fascial restrictions to min amounts or less to improve mobility required for functional reaching tasks.  Goal status: IN PROGRESS  3.  Pt will increase RUE A/ROM to at least 145 for flexion/abduction, and 50 degrees er/IR to improve ability to use RUE when  reaching overhead or behind back during dressing and bathing tasks.  Goal status: IN PROGRESS  4.  Pt will increase LUE strength to 4+/5 or greater to improve ability to use LUE when  lifting or carrying items during meal preparation/housework/yardwork tasks. Goal status: IN PROGRESS  5.  Pt will return to highest level of function using LUE as non-dominant during functional task completion.  Goal status: IN PROGRESS   ASSESSMENT:  CLINICAL IMPRESSION: This session, pt continuing to work on ROM and OT providing manual therapy to reduce fascial restrictions. He continues to have increased pain/soreness throughout session with mobility, however his P/ROM is improving to 65-70% of full ROM this session with incremental progression each repetition of P/ROM. OT providing verbal and visual cuing for technique during thumbtac exercise and table slides.   PERFORMANCE DEFICITS: in functional skills including ADLs, IADLs, edema, ROM, strength, pain, fascial restrictions, Gross motor control, body mechanics, and UE functional use.   PLAN:  OT FREQUENCY: 2x/week  OT DURATION: 8 weeks  PLANNED INTERVENTIONS: self care/ADL training, therapeutic exercise, therapeutic activity, manual therapy, scar mobilization, passive range of motion, functional mobility training, electrical stimulation, ultrasound, moist heat, cryotherapy, patient/family education, energy conservation, coping strategies training, and DME and/or AE instructions  RECOMMENDED OTHER SERVICES: N/A  CONSULTED AND AGREED WITH PLAN OF CARE: Patient  PLAN FOR NEXT SESSION: Manual Therapy, P/ROM, Thumb tacs, Table slides, low level wall washes   Paulita Fujita, OTR/L [facility] [place], [place] 07/24/2022, 10:35 AM

## 2022-07-24 NOTE — Patient Instructions (Signed)
  Complete the following 2-3 a day. Hold for 10-15 seconds. Complete 5-8 sets for each.   1) SHOULDER - ISOMETRIC FLEXION  Gently push your fist forward into a wall with your elbow bent.    2) SHOULDER - ISOMETRIC EXTENSION  Gently push your a bent elbow back into a wall.    3) SHOULDER - ISOMETRIC INTERNAL ROTATION   Gently press your hand into a wall using the palm side of your hand.  Maintain a bent elbow the entire time.        4) SHOULDER - ISOMETRIC ADDUCTION  Gently push your elbow into the side of your body.   5) SHOULDER - ISOMETRIC ABDUCTION  Gently push your elbow out to the side into a wall with your elbow bent.    

## 2022-07-26 ENCOUNTER — Encounter (HOSPITAL_COMMUNITY): Payer: Self-pay | Admitting: Occupational Therapy

## 2022-07-26 ENCOUNTER — Ambulatory Visit (HOSPITAL_COMMUNITY): Payer: PPO | Admitting: Occupational Therapy

## 2022-07-26 DIAGNOSIS — R29898 Other symptoms and signs involving the musculoskeletal system: Secondary | ICD-10-CM

## 2022-07-26 DIAGNOSIS — M25611 Stiffness of right shoulder, not elsewhere classified: Secondary | ICD-10-CM

## 2022-07-26 DIAGNOSIS — M25511 Pain in right shoulder: Secondary | ICD-10-CM | POA: Diagnosis not present

## 2022-07-26 NOTE — Therapy (Signed)
OUTPATIENT OCCUPATIONAL THERAPY ORTHO TREATMENT NOTE  Patient Name: [name] MRN: [id number] DOB:04-08-1946, 77 y.o., male Today's Date: 07/26/2022  PCP: [name], MD REFERRING PROVIDER: Ophelia Charter, MD  END OF SESSION:  OT End of Session - 07/26/22 1122     Visit Number 5    Number of Visits 17    Date for OT Re-Evaluation 09/14/22    Authorization Type [employer]    Progress Note Due on Visit 10    OT Start Time 1031    OT Stop Time 1114    OT Time Calculation (min) 43 min    Activity Tolerance Patient tolerated treatment well    Behavior During Therapy WFL for tasks assessed/performed              Past Medical History:  Diagnosis Date   Allergy to alpha-gal    2015; has been able to re-introduce red meat for the past 2 1/2 years without reaction (as of 01/21/19)   Arthritis    Chest pain    a. 03/2017: echo showing EF of 60-65%, no regional WMA or significant valve abnormalities. b. 03/2016: NST with no evidence of ischemia.    Complication of anesthesia    "anaphylactic" reaction to Xylocaine > 10 years ago; reported later recieved marcaine without issue   Difficult intubation 02/19/2018   No difficulty 01/26/19 with Mac and 3 or 11/10/19 with Glidescope   Digestive problems    on Prednisone prn for this issue- diarrhea   Dyspnea    pulmonary fibrosis   Dysrhythmia    irregular due to Myocarditis- takes Atenolol, Norvasc   GERD (gastroesophageal reflux disease)    H/O viral myocarditis    25 years ago   Head injury, closed, with concussion    Breif LOC   Hyperlipidemia    Hypertension    Mild mitral valve prolapse    per Dr Evette Georges notes   PAF (paroxysmal atrial fibrillation) ([place])    a. initially occuring in 02/2016. b. recurrent in 07/2016. Placed on Eliquis   Polymyalgia rheumatica (HCC)    PONV (postoperative nausea and vomiting)    Pre-diabetes    elevated due to high dose of prednsione for back no longer elevated    Pulmonary fibrosis (HCC)    Sleep apnea    no cpap mild   Past Surgical History:  Procedure Laterality Date   ANTERIOR CERVICAL DECOMP/DISCECTOMY FUSION N/A 11/10/2019   Procedure: Cervical Three-Four Anterior cervical decompression/discectomy/fusion;  Surgeon: [name], MD;  Location: [place];  Service: Neurosurgery;  Laterality: N/A;  Cervical Three-Four Anterior cervical decompression/discectomy/fusion   apendectomy  1965   APPENDECTOMY     APPLICATION OF ROBOTIC ASSISTANCE FOR SPINAL PROCEDURE N/A 12/22/2020   Procedure: APPLICATION OF ROBOTIC ASSISTANCE FOR SPINAL PROCEDURE;  Surgeon: [name], MD;  Location: [place];  Service: Neurosurgery;  Laterality: N/A;   BACK SURGERY     bone spur Bilateral 1999   feet   CARDIAC CATHETERIZATION  2001   CHOLECYSTECTOMY  2004   COLON RESECTION N/A 06/15/2018   Procedure: SIGMOID COLON RESECTION;  Surgeon: [name], MD;  [facility];  Service: General;  Laterality: N/A;   COLONOSCOPY     COLOSTOMY N/A 06/15/2018   Procedure: COLOSTOMY;  Surgeon: [name], MD;  [facility];  Service: General;  Laterality: N/A;   COLOSTOMY TAKEDOWN N/A 01/26/2019   Procedure: LAPAROSCOPIC ASSISTED COLOSTOMY TAKEDOWN;  Surgeon: [name], MD;  Location: [facility];  Service: General;  Laterality: N/A;   EYE SURGERY Bilateral    cataract removal   LAPAROSCOPIC LYSIS OF ADHESIONS N/A 01/26/2019   Procedure: LAPAROSCOPIC LYSIS OF ADHESIONS;  Surgeon: [name], MD;  Location: [place];  Service: General;  Laterality: N/A;   LAPAROTOMY N/A 06/15/2018   Procedure: EXPLORATORY LAPAROTOMY;  Surgeon: [name], MD;  [facility];  Service: General;  Laterality: N/A;   LUMBAR LAMINECTOMY/ DECOMPRESSION WITH MET-RX Right 05/05/2013   Procedure: Right Lumbar three-four Extraforaminal Microdiskectomy with Metrex;  Surgeon: [name], MD;  Location: [facility];  Service: Neurosurgery;  Laterality: Right;  Right Lumbar three-four  Extraforaminal Microdiskectomy with Metrex   LUNG BIOPSY Right 02/19/2018   Procedure: LUNG BIOPSY;  Surgeon: [name], MD;  Location: [place];  Service: Thoracic;  Laterality: Right;   POSTERIOR LUMBAR FUSION 4 LEVEL N/A 12/22/2020   Procedure: Lumbar one-two Posterior lumbar interbody fusion with stabilization from Thoracic ten to Lumbar one with robotic screw placement;  Surgeon: [name], MD;  Location: [place];  Service: Neurosurgery;  Laterality: N/A;  RM 20   REVERSE SHOULDER ARTHROPLASTY Right 07/11/2022   Procedure: REVERSE SHOULDER ARTHROPLASTY;  Surgeon: Hiram Gash, MD;  [facility];  Service: Orthopedics;  Laterality: Right;   SHOULDER OPEN ROTATOR CUFF REPAIR Bilateral 2001   TONSILLECTOMY     VIDEO ASSISTED THORACOSCOPY Right 02/19/2018   Procedure: VIDEO ASSISTED THORACOSCOPY;  Surgeon: [name], MD;  Location: [facility];  Service: Thoracic;  Laterality: Right;   Patient Active Problem List   Diagnosis Date Noted   Spinal stenosis of lumbar region with neurogenic claudication 12/22/2020   Pre-operative respiratory examination 12/13/2020   Cervical spondylosis with myelopathy 11/10/2019   Shortness of breath 08/04/2019   Colostomy in place ([place]) 01/26/2019   Prolonged Q-T interval on ECG 06/25/2018   Immunosuppression due to drug therapy for ILD 06/16/2018   Sigmoid colon perforation s/p Hartmann colectomy/colostomy 06/15/2018 06/15/2018   Perforated diverticulum of large intestine 06/15/2018   OSA (obstructive sleep apnea) 12/13/2016   Snoring 10/26/2016   PAF (paroxysmal atrial fibrillation) ([place]) 08/14/2016   ILD (interstitial lung disease) ([place]) 05/01/2016   Lumbar stenosis 02/01/2015   Giardia 04/14/2014   H/O viral cardiomyopathy 04/14/2014   Herniated nucleus pulposus, L3-4 right 05/05/2013   Other hypertrophic cardiomyopathy ([place]) 04/19/2013   Hyperlipidemia 04/19/2013   Essential hypertension 04/19/2013   Abnormal LFTs 09/16/2012    Nausea 07/10/2011   Chronic diarrhea 07/10/2011   Burping 07/10/2011   Osteoporosis 07/10/2011   Arthritis 07/10/2011    ONSET DATE: 07/11/22  REFERRING DIAG: R Reverse Total Shoulder Arthoscopy  THERAPY DIAG:  Acute pain of right shoulder  Shoulder stiffness, right  Other symptoms and signs involving the musculoskeletal system  Rationale for Evaluation and Treatment: Rehabilitation  SUBJECTIVE:   SUBJECTIVE STATEMENT: S:  Pt accompanied by: self and significant other  PERTINENT HISTORY: Patient reports medical history significant for pulmonary fibrosis, interstitial lung disease.   PRECAUTIONS: Shoulder  WEIGHT BEARING RESTRICTIONS: Yes No Weight Bearing  PAIN:  Are you having pain? No  FALLS: Has patient fallen in last 6 months? Yes. Number of falls 1 - tripping over a stool  PATIENT GOALS: "To get ROM back in the arm"  NEXT MD VISIT: 07/20/22  OBJECTIVE:   HAND DOMINANCE: Right  ADLs: Overall ADLs: Pt requiring mod-max assist for all ADL's and IADL's at this time. He is unable to lift items or sustain holds  on items due to weight.   FUNCTIONAL OUTCOME MEASURES: FOTO: 34.11  UPPER EXTREMITY ROM:     Passive ROM Right eval  Shoulder flexion 123  Shoulder abduction 115  Shoulder internal rotation 90  Shoulder external rotation -9  (Blank rows = not tested)  UPPER EXTREMITY MMT:     MMT Right eval  Shoulder flexion   Shoulder abduction   Shoulder adduction   Shoulder extension   Shoulder internal rotation   Shoulder external rotation   (Blank rows = not tested)   EDEMA: Mild to moderate swelling noted around surgical area  OBSERVATIONS: Severe Fascial restrictions noted in subscapularis, biceps, trapezius, and axillary region.   TODAY'S TREATMENT:                                                                                                                              DATE:   07/26/22 -Manual therapy: myofascial release and trigger point  applied to biceps, trapezius, scapula, and axillary region in order to address pain and fascial restrictions to improve ROM. -P/ROM: flexion, abduction, horizontal abduction, and er/IR, x15 -Isometrics: flexion, extension, abduction, er, IR, 3x10" holds -[employer]: flexion, abduction in scaption, x10 each  07/24/22 -Manual therapy: myofascial release and trigger point applied to biceps, trapezius, scapula, and axillary region in order to address pain and fascial restrictions to improve ROM. -P/ROM: flexion, abduction, horizontal abduction, and er/IR, x15 -Isometrics: flexion, extension, abduction, er, IR, 5x10-15" -[employer]: flexion, abduction in scaption, x10  07/19/22 -Manual therapy: myofascial release and trigger point applied to biceps, trapezius, scapula, and axillary region in order to address pain and fascial restrictions to improve ROM. -P/ROM: flexion, abduction, horizontal abduction, and er/IR, x15 -Thumbtacs: 2x45" -Table Slides: flexion and abduction, x10 each   PATIENT EDUCATION: Education details: re-printed isometric handout Person educated: Patient Education method: Explanation, Demonstration, and Handouts Education comprehension: verbalized understanding and returned demonstration  HOME EXERCISE PROGRAM: 3/1: Pendulums 3/5: Table Slides 3/12: Isometrics  GOALS: Goals reviewed with patient? Yes  SHORT TERM GOALS: Target date: 08/17/22  Pt will be provided with and educated on HEP to improve mobility in RUE required for use during ADL completion.  Goal status: IN PROGRESS  2.   Pt will increase RUE P/ROM by 40 degrees or greater to improve ability to use LUE during dressing tasks with minimal compensatory techniques.  Goal status: IN PROGRESS  3.  Pt will increase LUE strength to 3+/5 to improve ability to reach for items at waist to chest height during bathing and grooming tasks.  Goal status: IN PROGRESS   LONG TERM GOALS: Target date: 09/14/22  Pt will  decrease pain in RUE to 3/10 or less to improve ability to sleep for 2+ consecutive hours without waking due to pain.  Goal status: IN PROGRESS  2.  Pt will decrease RUE fascial restrictions to min amounts or less to improve mobility required for functional reaching tasks.  Goal status: IN PROGRESS  3.  Pt will increase RUE A/ROM to at least 145 for flexion/abduction, and 50 degrees er/IR to improve ability to use RUE when reaching overhead or behind back during dressing and bathing tasks.  Goal status: IN PROGRESS  4.  Pt will increase LUE strength to 4+/5 or greater to improve ability to use LUE when lifting or carrying items during meal preparation/housework/yardwork tasks. Goal status: IN PROGRESS  5.  Pt will return to highest level of function using LUE as non-dominant during functional task completion.  Goal status: IN PROGRESS   ASSESSMENT:  CLINICAL IMPRESSION: Pt reports he is completing his HEP, pain continues to be low. Continued with myofascial release to RUE, passive stretching. During passive stretching pt noting tightness/catching in tricep region after abduction. OT completing manual techniques, palpating small muscle knot. Pt tolerating ROM to approximately 70% during passive stretching, er most restricted as is expected. Continued with isometrics and therapy ball stretches. Verbal cuing for form and technique.   PERFORMANCE DEFICITS: in functional skills including ADLs, IADLs, edema, ROM, strength, pain, fascial restrictions, Gross motor control, body mechanics, and UE functional use.   PLAN:  OT FREQUENCY: 2x/week  OT DURATION: 8 weeks  PLANNED INTERVENTIONS: self care/ADL training, therapeutic exercise, therapeutic activity, manual therapy, scar mobilization, passive range of motion, functional mobility training, electrical stimulation, ultrasound, moist heat, cryotherapy, patient/family education, energy conservation, coping strategies training, and DME and/or AE  instructions  CONSULTED AND AGREED WITH PLAN OF CARE: Patient  PLAN FOR NEXT SESSION: Manual Therapy, P/ROM, Thumb tacs, Table slides, low level wall washes    Guadelupe Sabin, OTR/L  [phone] 07/26/2022, 11:22 AM

## 2022-07-26 NOTE — Patient Instructions (Signed)
  Complete the following 2 a day. Hold for 10 seconds. Complete 5 reps each.   1) SHOULDER - ISOMETRIC FLEXION  Gently push your fist forward into a wall with your elbow bent.    2) SHOULDER - ISOMETRIC EXTENSION  Gently push your a bent elbow back into a wall.    3) SHOULDER - ISOMETRIC EXTERNAL ROTATION   Gently press your hand into a wall using the palm side of your hand.  Maintain a bent elbow the entire time.        4) SHOULDER - ISOMETRIC ADDUCTION  Gently push your elbow into the side of your body.   5) SHOULDER - ISOMETRIC ABDUCTION  Gently push your elbow out to the side into a wall with your elbow bent.

## 2022-07-31 ENCOUNTER — Encounter (HOSPITAL_COMMUNITY): Payer: Self-pay | Admitting: Occupational Therapy

## 2022-07-31 ENCOUNTER — Ambulatory Visit (HOSPITAL_COMMUNITY): Payer: PPO | Admitting: Occupational Therapy

## 2022-07-31 DIAGNOSIS — M25611 Stiffness of right shoulder, not elsewhere classified: Secondary | ICD-10-CM

## 2022-07-31 DIAGNOSIS — M25511 Pain in right shoulder: Secondary | ICD-10-CM

## 2022-07-31 DIAGNOSIS — R29898 Other symptoms and signs involving the musculoskeletal system: Secondary | ICD-10-CM

## 2022-07-31 NOTE — Therapy (Signed)
OUTPATIENT OCCUPATIONAL THERAPY ORTHO TREATMENT NOTE  Patient Name: [name] MRN: [id number] DOB:April 22, 1946, 77 y.o., male Today's Date: 07/31/2022  PCP: [name], MD REFERRING PROVIDER: Ophelia Charter, MD  END OF SESSION:  OT End of Session - 07/31/22 1128     Visit Number 6    Number of Visits 17    Date for OT Re-Evaluation 09/14/22    Authorization Type [employer]    Progress Note Due on Visit 10    OT Start Time 1124    OT Stop Time 1204    OT Time Calculation (min) 40 min    Activity Tolerance Patient tolerated treatment well    Behavior During Therapy WFL for tasks assessed/performed              Past Medical History:  Diagnosis Date   Allergy to alpha-gal    2015; has been able to re-introduce red meat for the past 2 1/2 years without reaction (as of 01/21/19)   Arthritis    Chest pain    a. 03/2017: echo showing EF of 60-65%, no regional WMA or significant valve abnormalities. b. 03/2016: NST with no evidence of ischemia.    Complication of anesthesia    "anaphylactic" reaction to Xylocaine > 10 years ago; reported later recieved marcaine without issue   Difficult intubation 02/19/2018   No difficulty 01/26/19 with Mac and 3 or 11/10/19 with Glidescope   Digestive problems    on Prednisone prn for this issue- diarrhea   Dyspnea    pulmonary fibrosis   Dysrhythmia    irregular due to Myocarditis- takes Atenolol, Norvasc   GERD (gastroesophageal reflux disease)    H/O viral myocarditis    25 years ago   Head injury, closed, with concussion    Breif LOC   Hyperlipidemia    Hypertension    Mild mitral valve prolapse    per Dr Evette Georges notes   PAF (paroxysmal atrial fibrillation) ([place])    a. initially occuring in 02/2016. b. recurrent in 07/2016. Placed on Eliquis   Polymyalgia rheumatica (HCC)    PONV (postoperative nausea and vomiting)    Pre-diabetes    elevated due to high dose of prednsione for back no longer elevated    Pulmonary fibrosis (HCC)    Sleep apnea    no cpap mild   Past Surgical History:  Procedure Laterality Date   ANTERIOR CERVICAL DECOMP/DISCECTOMY FUSION N/A 11/10/2019   Procedure: Cervical Three-Four Anterior cervical decompression/discectomy/fusion;  Surgeon: [name], MD;  Location: [place];  Service: Neurosurgery;  Laterality: N/A;  Cervical Three-Four Anterior cervical decompression/discectomy/fusion   apendectomy  1965   APPENDECTOMY     APPLICATION OF ROBOTIC ASSISTANCE FOR SPINAL PROCEDURE N/A 12/22/2020   Procedure: APPLICATION OF ROBOTIC ASSISTANCE FOR SPINAL PROCEDURE;  Surgeon: [name], MD;  Location: [place];  Service: Neurosurgery;  Laterality: N/A;   BACK SURGERY     bone spur Bilateral 1999   feet   CARDIAC CATHETERIZATION  2001   CHOLECYSTECTOMY  2004   COLON RESECTION N/A 06/15/2018   Procedure: SIGMOID COLON RESECTION;  Surgeon: [name], MD;  Location: [facility];  Service: General;  Laterality: N/A;   COLONOSCOPY     COLOSTOMY N/A 06/15/2018   Procedure: COLOSTOMY;  Surgeon: [name], MD;  Location: [facility];  Service: General;  Laterality: N/A;   COLOSTOMY TAKEDOWN N/A 01/26/2019   Procedure: LAPAROSCOPIC ASSISTED COLOSTOMY TAKEDOWN;  Surgeon: [name], MD;  Location[facility]  OR;  Service: General;  Laterality: N/A;   EYE SURGERY Bilateral    cataract removal   LAPAROSCOPIC LYSIS OF ADHESIONS N/A 01/26/2019   Procedure: LAPAROSCOPIC LYSIS OF ADHESIONS;  Surgeon: [name], MD;  Location: [place];  Service: General;  Laterality: N/A;   LAPAROTOMY N/A 06/15/2018   Procedure: EXPLORATORY LAPAROTOMY;  Surgeon: [name], MD;  Location: [facility];  Service: General;  Laterality: N/A;   LUMBAR LAMINECTOMY/ DECOMPRESSION WITH MET-RX Right 05/05/2013   Procedure: Right Lumbar three-four Extraforaminal Microdiskectomy with Metrex;  Surgeon: [name], MD;  Location: [facility];  Service: Neurosurgery;  Laterality: Right;  Right Lumbar three-four  Extraforaminal Microdiskectomy with Metrex   LUNG BIOPSY Right 02/19/2018   Procedure: LUNG BIOPSY;  Surgeon: [name], MD;  Location: [place];  Service: Thoracic;  Laterality: Right;   POSTERIOR LUMBAR FUSION 4 LEVEL N/A 12/22/2020   Procedure: Lumbar one-two Posterior lumbar interbody fusion with stabilization from Thoracic ten to Lumbar one with robotic screw placement;  Surgeon: [name], MD;  Location: [place];  Service: Neurosurgery;  Laterality: N/A;  RM 20   REVERSE SHOULDER ARTHROPLASTY Right 07/11/2022   Procedure: REVERSE SHOULDER ARTHROPLASTY;  Surgeon: Hiram Gash, MD;  Location: [facility];  Service: Orthopedics;  Laterality: Right;   SHOULDER OPEN ROTATOR CUFF REPAIR Bilateral 2001   TONSILLECTOMY     VIDEO ASSISTED THORACOSCOPY Right 02/19/2018   Procedure: VIDEO ASSISTED THORACOSCOPY;  Surgeon: [name], MD;  Location: [facility] OR;  Service: Thoracic;  Laterality: Right;   Patient Active Problem List   Diagnosis Date Noted   Spinal stenosis of lumbar region with neurogenic claudication 12/22/2020   Pre-operative respiratory examination 12/13/2020   Cervical spondylosis with myelopathy 11/10/2019   Shortness of breath 08/04/2019   Colostomy in place ([place]) 01/26/2019   Prolonged Q-T interval on ECG 06/25/2018   Immunosuppression due to drug therapy for ILD 06/16/2018   Sigmoid colon perforation s/p Hartmann colectomy/colostomy 06/15/2018 06/15/2018   Perforated diverticulum of large intestine 06/15/2018   OSA (obstructive sleep apnea) 12/13/2016   Snoring 10/26/2016   PAF (paroxysmal atrial fibrillation) ([place]) 08/14/2016   ILD (interstitial lung disease) ([place]) 05/01/2016   Lumbar stenosis 02/01/2015   Giardia 04/14/2014   H/O viral cardiomyopathy 04/14/2014   Herniated nucleus pulposus, L3-4 right 05/05/2013   Other hypertrophic cardiomyopathy ([place]) 04/19/2013   Hyperlipidemia 04/19/2013   Essential hypertension 04/19/2013   Abnormal LFTs 09/16/2012    Nausea 07/10/2011   Chronic diarrhea 07/10/2011   Burping 07/10/2011   Osteoporosis 07/10/2011   Arthritis 07/10/2011    ONSET DATE: 07/11/22  REFERRING DIAG: R Reverse Total Shoulder Arthoscopy  THERAPY DIAG:  Acute pain of right shoulder  Shoulder stiffness, right  Other symptoms and signs involving the musculoskeletal system  Rationale for Evaluation and Treatment: Rehabilitation  SUBJECTIVE:   SUBJECTIVE STATEMENT: S: "I was really sore after my last visit." Pt accompanied by: self and significant other  PERTINENT HISTORY: Patient reports medical history significant for pulmonary fibrosis, interstitial lung disease.   PRECAUTIONS: Shoulder  WEIGHT BEARING RESTRICTIONS: Yes No Weight Bearing  PAIN:  Are you having pain? Yes: NPRS scale: 4/10 Pain location: anterior aspect of shoulder and bicep Pain description: sore and aching Aggravating factors: exercises/movement Relieving factors: rest, ice, medication  FALLS: Has patient fallen in last 6 months? Yes. Number of falls 1 - tripping over a stool  PATIENT GOALS: "To get ROM back in the arm"  NEXT MD VISIT: 07/20/22  OBJECTIVE:   HAND DOMINANCE: Right  ADLs: Overall ADLs: Pt requiring mod-max assist for all ADL's and IADL's at this time. He is unable to lift items or sustain holds on items due to weight.   FUNCTIONAL OUTCOME MEASURES: FOTO: 34.11  UPPER EXTREMITY ROM:     Passive ROM Right eval  Shoulder flexion 123  Shoulder abduction 115  Shoulder internal rotation 90  Shoulder external rotation -9  (Blank rows = not tested)  UPPER EXTREMITY MMT:     MMT Right eval  Shoulder flexion   Shoulder abduction   Shoulder adduction   Shoulder extension   Shoulder internal rotation   Shoulder external rotation   (Blank rows = not tested)   EDEMA: Mild to moderate swelling noted around surgical area  OBSERVATIONS: Severe Fascial restrictions noted in subscapularis, biceps, trapezius, and axillary  region.   TODAY'S TREATMENT:                                                                                                                              DATE:   07/31/22 -Manual therapy: myofascial release and trigger point applied to biceps, trapezius, scapula, and axillary region in order to address pain and fascial restrictions to improve ROM. -P/ROM: flexion, abduction, horizontal abduction, and er/IR, x15 -[employer]: flexion, abduction in scaption, x10 each  07/26/22 -Manual therapy: myofascial release and trigger point applied to biceps, trapezius, scapula, and axillary region in order to address pain and fascial restrictions to improve ROM. -P/ROM: flexion, abduction, horizontal abduction, and er/IR, x15 -Isometrics: flexion, extension, abduction, er, IR, 3x10" holds -[employer]: flexion, abduction in scaption, x10 each  07/24/22 -Manual therapy: myofascial release and trigger point applied to biceps, trapezius, scapula, and axillary region in order to address pain and fascial restrictions to improve ROM. -P/ROM: flexion, abduction, horizontal abduction, and er/IR, x15 -Isometrics: flexion, extension, abduction, er, IR, 5x10-15" -[employer]: flexion, abduction in scaption, x10   PATIENT EDUCATION: Education details: Reviewed HEP Person educated: Patient Education method: Explanation, Demonstration, and Handouts Education comprehension: verbalized understanding and returned demonstration  HOME EXERCISE PROGRAM: 3/1: Pendulums 3/5: Table Slides 3/12: Isometrics  GOALS: Goals reviewed with patient? Yes  SHORT TERM GOALS: Target date: 08/17/22  Pt will be provided with and educated on HEP to improve mobility in RUE required for use during ADL completion.  Goal status: IN PROGRESS  2.   Pt will increase RUE P/ROM by 40 degrees or greater to improve ability to use LUE during dressing tasks with minimal compensatory techniques.  Goal status: IN PROGRESS  3.  Pt will  increase LUE strength to 3+/5 to improve ability to reach for items at waist to chest height during bathing and grooming tasks.  Goal status: IN PROGRESS   LONG TERM GOALS: Target date: 09/14/22  Pt will decrease pain in RUE to 3/10 or less to improve ability to sleep for 2+ consecutive hours without waking due to pain.  Goal status:  IN PROGRESS  2.  Pt will decrease RUE fascial restrictions to min amounts or less to improve mobility required for functional reaching tasks.  Goal status: IN PROGRESS  3.  Pt will increase RUE A/ROM to at least 145 for flexion/abduction, and 50 degrees er/IR to improve ability to use RUE when reaching overhead or behind back during dressing and bathing tasks.  Goal status: IN PROGRESS  4.  Pt will increase LUE strength to 4+/5 or greater to improve ability to use LUE when lifting or carrying items during meal preparation/housework/yardwork tasks. Goal status: IN PROGRESS  5.  Pt will return to highest level of function using LUE as non-dominant during functional task completion.  Goal status: IN PROGRESS   ASSESSMENT:  CLINICAL IMPRESSION: Pt presenting to therapy with increased pain and soreness from prior session last week. He has severe fascial restrictions in his biceps, pectoralis, and scapularis region, addressed by manual therapy. OT requiring increased time during P/ROM as pt was very sensitive to stretching out this session and required education on breathing and relaxation techniques. OT provided hands on therapy throughout entirety of session, as well as verbal and tactile cuing during ball rolls at the end of the session.   PERFORMANCE DEFICITS: in functional skills including ADLs, IADLs, edema, ROM, strength, pain, fascial restrictions, Gross motor control, body mechanics, and UE functional use.   PLAN:  OT FREQUENCY: 2x/week  OT DURATION: 8 weeks  PLANNED INTERVENTIONS: self care/ADL training, therapeutic exercise, therapeutic activity,  manual therapy, scar mobilization, passive range of motion, functional mobility training, electrical stimulation, ultrasound, moist heat, cryotherapy, patient/family education, energy conservation, coping strategies training, and DME and/or AE instructions  CONSULTED AND AGREED WITH PLAN OF CARE: Patient  PLAN FOR NEXT SESSION: Manual Therapy, P/ROM, Thumb tacs, Table slides, low level wall washes    Paulita Fujita, OTR/L [phone] 07/31/2022, 11:30 AM

## 2022-08-01 DIAGNOSIS — R768 Other specified abnormal immunological findings in serum: Secondary | ICD-10-CM | POA: Diagnosis not present

## 2022-08-01 DIAGNOSIS — J84112 Idiopathic pulmonary fibrosis: Secondary | ICD-10-CM | POA: Diagnosis not present

## 2022-08-01 DIAGNOSIS — E663 Overweight: Secondary | ICD-10-CM | POA: Diagnosis not present

## 2022-08-01 DIAGNOSIS — S32010D Wedge compression fracture of first lumbar vertebra, subsequent encounter for fracture with routine healing: Secondary | ICD-10-CM | POA: Diagnosis not present

## 2022-08-01 DIAGNOSIS — M5136 Other intervertebral disc degeneration, lumbar region: Secondary | ICD-10-CM | POA: Diagnosis not present

## 2022-08-01 DIAGNOSIS — Z6827 Body mass index (BMI) 27.0-27.9, adult: Secondary | ICD-10-CM | POA: Diagnosis not present

## 2022-08-01 DIAGNOSIS — M8080XD Other osteoporosis with current pathological fracture, unspecified site, subsequent encounter for fracture with routine healing: Secondary | ICD-10-CM | POA: Diagnosis not present

## 2022-08-01 DIAGNOSIS — M353 Polymyalgia rheumatica: Secondary | ICD-10-CM | POA: Diagnosis not present

## 2022-08-01 DIAGNOSIS — Z7952 Long term (current) use of systemic steroids: Secondary | ICD-10-CM | POA: Diagnosis not present

## 2022-08-02 ENCOUNTER — Ambulatory Visit (HOSPITAL_COMMUNITY): Payer: PPO | Admitting: Occupational Therapy

## 2022-08-02 ENCOUNTER — Encounter (HOSPITAL_COMMUNITY): Payer: Self-pay | Admitting: Occupational Therapy

## 2022-08-02 DIAGNOSIS — M25511 Pain in right shoulder: Secondary | ICD-10-CM | POA: Diagnosis not present

## 2022-08-02 DIAGNOSIS — M25611 Stiffness of right shoulder, not elsewhere classified: Secondary | ICD-10-CM

## 2022-08-02 DIAGNOSIS — R29898 Other symptoms and signs involving the musculoskeletal system: Secondary | ICD-10-CM

## 2022-08-02 NOTE — Therapy (Signed)
OUTPATIENT OCCUPATIONAL THERAPY ORTHO TREATMENT NOTE  Patient Name: [name] MRN: [id number] DOB:Jul 02, 1945, 77 y.o., male Today's Date: 08/02/2022  PCP: [name], MD REFERRING PROVIDER: Ophelia Charter, MD  END OF SESSION:  OT End of Session - 08/02/22 1149     Visit Number 7    Number of Visits 17    Date for OT Re-Evaluation 09/14/22    Authorization Type [employer]    Progress Note Due on Visit 10    OT Start Time 1120    OT Stop Time 1200    OT Time Calculation (min) 40 min    Activity Tolerance Patient tolerated treatment well    Behavior During Therapy [facility] for tasks assessed/performed              Past Medical History:  Diagnosis Date   Allergy to alpha-gal    2015; has been able to re-introduce red meat for the past 2 1/2 years without reaction (as of 01/21/19)   Arthritis    Chest pain    a. 03/2017: echo showing EF of 60-65%, no regional WMA or significant valve abnormalities. b. 03/2016: NST with no evidence of ischemia.    Complication of anesthesia    "anaphylactic" reaction to Xylocaine > 10 years ago; reported later recieved marcaine without issue   Difficult intubation 02/19/2018   No difficulty 01/26/19 with Mac and 3 or 11/10/19 with Glidescope   Digestive problems    on Prednisone prn for this issue- diarrhea   Dyspnea    pulmonary fibrosis   Dysrhythmia    irregular due to Myocarditis- takes Atenolol, Norvasc   GERD (gastroesophageal reflux disease)    H/O viral myocarditis    25 years ago   Head injury, closed, with concussion    Breif LOC   Hyperlipidemia    Hypertension    Mild mitral valve prolapse    per Dr Evette Georges notes   PAF (paroxysmal atrial fibrillation) ([place])    a. initially occuring in 02/2016. b. recurrent in 07/2016. Placed on Eliquis   Polymyalgia rheumatica (HCC)    PONV (postoperative nausea and vomiting)    Pre-diabetes    elevated due to high dose of prednsione for back no longer elevated    Pulmonary fibrosis (HCC)    Sleep apnea    no cpap mild   Past Surgical History:  Procedure Laterality Date   ANTERIOR CERVICAL DECOMP/DISCECTOMY FUSION N/A 11/10/2019   Procedure: Cervical Three-Four Anterior cervical decompression/discectomy/fusion;  Surgeon: [name], MD;  Location: [place];  Service: Neurosurgery;  Laterality: N/A;  Cervical Three-Four Anterior cervical decompression/discectomy/fusion   apendectomy  1965   APPENDECTOMY     APPLICATION OF ROBOTIC ASSISTANCE FOR SPINAL PROCEDURE N/A 12/22/2020   Procedure: APPLICATION OF ROBOTIC ASSISTANCE FOR SPINAL PROCEDURE;  Surgeon: [name], MD;  Location: [place];  Service: Neurosurgery;  Laterality: N/A;   BACK SURGERY     bone spur Bilateral 1999   feet   CARDIAC CATHETERIZATION  2001   CHOLECYSTECTOMY  2004   COLON RESECTION N/A 06/15/2018   Procedure: SIGMOID COLON RESECTION;  Surgeon: [name], MD;  Location: [facility];  Service: General;  Laterality: N/A;   COLONOSCOPY     COLOSTOMY N/A 06/15/2018   Procedure: COLOSTOMY;  Surgeon: [name], MD;  Location: [facility];  Service: General;  Laterality: N/A;   COLOSTOMY TAKEDOWN N/A 01/26/2019   Procedure: LAPAROSCOPIC ASSISTED COLOSTOMY TAKEDOWN;  Surgeon: [name], MD;  Location[facility]  OR;  Service: General;  Laterality: N/A;   EYE SURGERY Bilateral    cataract removal   LAPAROSCOPIC LYSIS OF ADHESIONS N/A 01/26/2019   Procedure: LAPAROSCOPIC LYSIS OF ADHESIONS;  Surgeon: [name], MD;  Location: [place];  Service: General;  Laterality: N/A;   LAPAROTOMY N/A 06/15/2018   Procedure: EXPLORATORY LAPAROTOMY;  Surgeon: [name], MD;  Location: [facility];  Service: General;  Laterality: N/A;   LUMBAR LAMINECTOMY/ DECOMPRESSION WITH MET-RX Right 05/05/2013   Procedure: Right Lumbar three-four Extraforaminal Microdiskectomy with Metrex;  Surgeon: [name], MD;  Location: [facility];  Service: Neurosurgery;  Laterality: Right;  Right Lumbar three-four  Extraforaminal Microdiskectomy with Metrex   LUNG BIOPSY Right 02/19/2018   Procedure: LUNG BIOPSY;  Surgeon: [name], MD;  Location: [place];  Service: Thoracic;  Laterality: Right;   POSTERIOR LUMBAR FUSION 4 LEVEL N/A 12/22/2020   Procedure: Lumbar one-two Posterior lumbar interbody fusion with stabilization from Thoracic ten to Lumbar one with robotic screw placement;  Surgeon: [name], MD;  Location: [place];  Service: Neurosurgery;  Laterality: N/A;  RM 20   REVERSE SHOULDER ARTHROPLASTY Right 07/11/2022   Procedure: REVERSE SHOULDER ARTHROPLASTY;  Surgeon: Hiram Gash, MD;  Location: [facility];  Service: Orthopedics;  Laterality: Right;   SHOULDER OPEN ROTATOR CUFF REPAIR Bilateral 2001   TONSILLECTOMY     VIDEO ASSISTED THORACOSCOPY Right 02/19/2018   Procedure: VIDEO ASSISTED THORACOSCOPY;  Surgeon: [name], MD;  Location: [facility] OR;  Service: Thoracic;  Laterality: Right;   Patient Active Problem List   Diagnosis Date Noted   Spinal stenosis of lumbar region with neurogenic claudication 12/22/2020   Pre-operative respiratory examination 12/13/2020   Cervical spondylosis with myelopathy 11/10/2019   Shortness of breath 08/04/2019   Colostomy in place ([place]) 01/26/2019   Prolonged Q-T interval on ECG 06/25/2018   Immunosuppression due to drug therapy for ILD 06/16/2018   Sigmoid colon perforation s/p Hartmann colectomy/colostomy 06/15/2018 06/15/2018   Perforated diverticulum of large intestine 06/15/2018   OSA (obstructive sleep apnea) 12/13/2016   Snoring 10/26/2016   PAF (paroxysmal atrial fibrillation) ([place]) 08/14/2016   ILD (interstitial lung disease) ([place]) 05/01/2016   Lumbar stenosis 02/01/2015   Giardia 04/14/2014   H/O viral cardiomyopathy 04/14/2014   Herniated nucleus pulposus, L3-4 right 05/05/2013   Other hypertrophic cardiomyopathy ([place]) 04/19/2013   Hyperlipidemia 04/19/2013   Essential hypertension 04/19/2013   Abnormal LFTs 09/16/2012    Nausea 07/10/2011   Chronic diarrhea 07/10/2011   Burping 07/10/2011   Osteoporosis 07/10/2011   Arthritis 07/10/2011    ONSET DATE: 07/11/22  REFERRING DIAG: R Reverse Total Shoulder Arthoscopy  THERAPY DIAG:  Acute pain of right shoulder  Shoulder stiffness, right  Other symptoms and signs involving the musculoskeletal system  Rationale for Evaluation and Treatment: Rehabilitation  SUBJECTIVE:   SUBJECTIVE STATEMENT: S: "I'm feeling much better today" Pt accompanied by: self and significant other  PERTINENT HISTORY: Patient reports medical history significant for pulmonary fibrosis, interstitial lung disease.   PRECAUTIONS: Shoulder  WEIGHT BEARING RESTRICTIONS: Yes No Weight Bearing  PAIN:  Are you having pain? Yes: NPRS scale: 4/10 Pain location: anterior aspect of shoulder and bicep Pain description: sore and aching Aggravating factors: exercises/movement Relieving factors: rest, ice, medication  FALLS: Has patient fallen in last 6 months? Yes. Number of falls 1 - tripping over a stool  PATIENT GOALS: "To get ROM back in the arm"  NEXT MD VISIT: 07/20/22  OBJECTIVE:  HAND DOMINANCE: Right  ADLs: Overall ADLs: Pt requiring mod-max assist for all ADL's and IADL's at this time. He is unable to lift items or sustain holds on items due to weight.   FUNCTIONAL OUTCOME MEASURES: FOTO: 34.11  UPPER EXTREMITY ROM:     Passive ROM Right eval  Shoulder flexion 123  Shoulder abduction 115  Shoulder internal rotation 90  Shoulder external rotation -9  (Blank rows = not tested)  UPPER EXTREMITY MMT:     MMT Right eval  Shoulder flexion   Shoulder abduction   Shoulder adduction   Shoulder extension   Shoulder internal rotation   Shoulder external rotation   (Blank rows = not tested)   EDEMA: Mild to moderate swelling noted around surgical area  OBSERVATIONS: Severe Fascial restrictions noted in subscapularis, biceps, trapezius, and axillary  region.   TODAY'S TREATMENT:                                                                                                                              DATE:   08/02/22 -Manual therapy: myofascial release and trigger point applied to biceps, trapezius, scapula, and axillary region in order to address pain and fascial restrictions to improve ROM. -P/ROM: flexion, abduction, horizontal abduction, and er/IR, x15 -[employer]: flexion, abduction in scaption, x10 each -Pulleys: flexion, abduction, x60"  07/31/22 -Manual therapy: myofascial release and trigger point applied to biceps, trapezius, scapula, and axillary region in order to address pain and fascial restrictions to improve ROM. -P/ROM: flexion, abduction, horizontal abduction, and er/IR, x15 -[employer]: flexion, abduction in scaption, x10 each  07/26/22 -Manual therapy: myofascial release and trigger point applied to biceps, trapezius, scapula, and axillary region in order to address pain and fascial restrictions to improve ROM. -P/ROM: flexion, abduction, horizontal abduction, and er/IR, x15 -Isometrics: flexion, extension, abduction, er, IR, 3x10" holds -[employer]: flexion, abduction in scaption, x10 each    PATIENT EDUCATION: Education details: Reviewed HEP Person educated: Patient Education method: Explanation, Demonstration, and Handouts Education comprehension: verbalized understanding and returned demonstration  HOME EXERCISE PROGRAM: 3/1: Pendulums 3/5: Table Slides 3/12: Isometrics  GOALS: Goals reviewed with patient? Yes  SHORT TERM GOALS: Target date: 08/17/22  Pt will be provided with and educated on HEP to improve mobility in RUE required for use during ADL completion.  Goal status: IN PROGRESS  2.   Pt will increase RUE P/ROM by 40 degrees or greater to improve ability to use LUE during dressing tasks with minimal compensatory techniques.  Goal status: IN PROGRESS  3.  Pt will increase LUE strength to  3+/5 to improve ability to reach for items at waist to chest height during bathing and grooming tasks.  Goal status: IN PROGRESS   LONG TERM GOALS: Target date: 09/14/22  Pt will decrease pain in RUE to 3/10 or less to improve ability to sleep for 2+ consecutive hours without waking due to pain.  Goal status: IN PROGRESS  2.  Pt will decrease RUE fascial restrictions to min amounts or less to improve mobility required for functional reaching tasks.  Goal status: IN PROGRESS  3.  Pt will increase RUE A/ROM to at least 145 for flexion/abduction, and 50 degrees er/IR to improve ability to use RUE when reaching overhead or behind back during dressing and bathing tasks.  Goal status: IN PROGRESS  4.  Pt will increase LUE strength to 4+/5 or greater to improve ability to use LUE when lifting or carrying items during meal preparation/housework/yardwork tasks. Goal status: IN PROGRESS  5.  Pt will return to highest level of function using LUE as non-dominant during functional task completion.  Goal status: IN PROGRESS   ASSESSMENT:  CLINICAL IMPRESSION: This session pt is demonstrating improving P/ROM reaching approximately 65-70% of full ROM with mild pain reported. Per pt's protocol, OT started him on pulley's this session for both flexion and abduction, where he is able to achieve approximately 60-65% of full ROM with good movement pattern. OT providing hands on therapy for both manual therapy and P/ROM, then providing verbal cuing for positioning and technique during ball rolls and pulleys for accuracy and safety.  PERFORMANCE DEFICITS: in functional skills including ADLs, IADLs, edema, ROM, strength, pain, fascial restrictions, Gross motor control, body mechanics, and UE functional use.   PLAN:  OT FREQUENCY: 2x/week  OT DURATION: 8 weeks  PLANNED INTERVENTIONS: self care/ADL training, therapeutic exercise, therapeutic activity, manual therapy, scar mobilization, passive range of  motion, functional mobility training, electrical stimulation, ultrasound, moist heat, cryotherapy, patient/family education, energy conservation, coping strategies training, and DME and/or AE instructions  CONSULTED AND AGREED WITH PLAN OF CARE: Patient  PLAN FOR NEXT SESSION: Manual Therapy, P/ROM, Thumb tacs, Table slides, low level wall washes    Paulita Fujita, OTR/L [phone] 08/02/2022, 11:52 AM

## 2022-08-07 ENCOUNTER — Encounter (HOSPITAL_COMMUNITY): Payer: PPO | Admitting: Occupational Therapy

## 2022-08-07 DIAGNOSIS — I10 Essential (primary) hypertension: Secondary | ICD-10-CM | POA: Diagnosis not present

## 2022-08-07 DIAGNOSIS — R102 Pelvic and perineal pain: Secondary | ICD-10-CM | POA: Diagnosis not present

## 2022-08-07 DIAGNOSIS — M19011 Primary osteoarthritis, right shoulder: Secondary | ICD-10-CM | POA: Diagnosis not present

## 2022-08-07 DIAGNOSIS — M19019 Primary osteoarthritis, unspecified shoulder: Secondary | ICD-10-CM | POA: Diagnosis not present

## 2022-08-07 DIAGNOSIS — Z6828 Body mass index (BMI) 28.0-28.9, adult: Secondary | ICD-10-CM | POA: Diagnosis not present

## 2022-08-07 DIAGNOSIS — Z713 Dietary counseling and surveillance: Secondary | ICD-10-CM | POA: Diagnosis not present

## 2022-08-09 ENCOUNTER — Encounter (HOSPITAL_COMMUNITY): Payer: Self-pay | Admitting: Occupational Therapy

## 2022-08-09 ENCOUNTER — Ambulatory Visit (HOSPITAL_COMMUNITY): Payer: PPO | Admitting: Occupational Therapy

## 2022-08-09 DIAGNOSIS — M25511 Pain in right shoulder: Secondary | ICD-10-CM

## 2022-08-09 DIAGNOSIS — M25611 Stiffness of right shoulder, not elsewhere classified: Secondary | ICD-10-CM

## 2022-08-09 DIAGNOSIS — R29898 Other symptoms and signs involving the musculoskeletal system: Secondary | ICD-10-CM

## 2022-08-09 NOTE — Patient Instructions (Signed)

## 2022-08-09 NOTE — Therapy (Signed)
OUTPATIENT OCCUPATIONAL THERAPY ORTHO TREATMENT NOTE  Patient Name: [name] MRN: [id number] DOB:[birth year], 77 y.o., male Today's Date: 08/09/2022  PCP: [name], MD REFERRING PROVIDER: Ophelia Charter, MD  END OF SESSION:  OT End of Session - 08/09/22 1036     Visit Number 8    Number of Visits 17    Date for OT Re-Evaluation 09/14/22    Authorization Type [employer]    Progress Note Due on Visit 10    OT Start Time 1033    OT Stop Time 1115    OT Time Calculation (min) 42 min    Activity Tolerance Patient tolerated treatment well    Behavior During Therapy [facility] for tasks assessed/performed              Past Medical History:  Diagnosis Date   Allergy to alpha-gal    2015; has been able to re-introduce red meat for the past 2 1/2 years without reaction (as of 01/21/19)   Arthritis    Chest pain    a. 03/2017: echo showing EF of 60-65%, no regional WMA or significant valve abnormalities. b. 03/2016: NST with no evidence of ischemia.    Complication of anesthesia    "anaphylactic" reaction to Xylocaine > 10 years ago; reported later recieved marcaine without issue   Difficult intubation 02/19/2018   No difficulty 01/26/19 with Mac and 3 or 11/10/19 with Glidescope   Digestive problems    on Prednisone prn for this issue- diarrhea   Dyspnea    pulmonary fibrosis   Dysrhythmia    irregular due to Myocarditis- takes Atenolol, Norvasc   GERD (gastroesophageal reflux disease)    H/O viral myocarditis    25 years ago   Head injury, closed, with concussion    Breif LOC   Hyperlipidemia    Hypertension    Mild mitral valve prolapse    per Dr Evette Georges notes   PAF (paroxysmal atrial fibrillation) ([place])    a. initially occuring in 02/2016. b. recurrent in 07/2016. Placed on Eliquis   Polymyalgia rheumatica (HCC)    PONV (postoperative nausea and vomiting)    Pre-diabetes    elevated due to high dose of prednsione for back no longer elevated    Pulmonary fibrosis (HCC)    Sleep apnea    no cpap mild   Past Surgical History:  Procedure Laterality Date   ANTERIOR CERVICAL DECOMP/DISCECTOMY FUSION N/A 11/10/2019   Procedure: Cervical Three-Four Anterior cervical decompression/discectomy/fusion;  Surgeon: [name], MD;  Location: [place];  Service: Neurosurgery;  Laterality: N/A;  Cervical Three-Four Anterior cervical decompression/discectomy/fusion   apendectomy  1965   APPENDECTOMY     APPLICATION OF ROBOTIC ASSISTANCE FOR SPINAL PROCEDURE N/A 12/22/2020   Procedure: APPLICATION OF ROBOTIC ASSISTANCE FOR SPINAL PROCEDURE;  Surgeon: [name], MD;  Location: [place];  Service: Neurosurgery;  Laterality: N/A;   BACK SURGERY     bone spur Bilateral 1999   feet   CARDIAC CATHETERIZATION  2001   CHOLECYSTECTOMY  2004   COLON RESECTION N/A 06/15/2018   Procedure: SIGMOID COLON RESECTION;  Surgeon: [name], MD;  Location: [facility];  Service: General;  Laterality: N/A;   COLONOSCOPY     COLOSTOMY N/A 06/15/2018   Procedure: COLOSTOMY;  Surgeon: [name], MD;  Location: [facility];  Service: General;  Laterality: N/A;   COLOSTOMY TAKEDOWN N/A 01/26/2019   Procedure: LAPAROSCOPIC ASSISTED COLOSTOMY TAKEDOWN;  Surgeon: [name], MD;  Location: [facility];  Service: General;  Laterality: N/A;   EYE SURGERY Bilateral    cataract removal   LAPAROSCOPIC LYSIS OF ADHESIONS N/A 01/26/2019   Procedure: LAPAROSCOPIC LYSIS OF ADHESIONS;  Surgeon: [name], MD;  Location: [place];  Service: General;  Laterality: N/A;   LAPAROTOMY N/A 06/15/2018   Procedure: EXPLORATORY LAPAROTOMY;  Surgeon: [name], MD;  Location: [facility];  Service: General;  Laterality: N/A;   LUMBAR LAMINECTOMY/ DECOMPRESSION WITH MET-RX Right 05/05/2013   Procedure: Right Lumbar three-four Extraforaminal Microdiskectomy with Metrex;  Surgeon: [name], MD;  Location: [facility];  Service: Neurosurgery;  Laterality: Right;  Right Lumbar three-four  Extraforaminal Microdiskectomy with Metrex   LUNG BIOPSY Right 02/19/2018   Procedure: LUNG BIOPSY;  Surgeon: [name], MD;  Location: [place];  Service: Thoracic;  Laterality: Right;   POSTERIOR LUMBAR FUSION 4 LEVEL N/A 12/22/2020   Procedure: Lumbar one-two Posterior lumbar interbody fusion with stabilization from Thoracic ten to Lumbar one with robotic screw placement;  Surgeon: [name], MD;  Location: [place];  Service: Neurosurgery;  Laterality: N/A;  RM 20   REVERSE SHOULDER ARTHROPLASTY Right 07/11/2022   Procedure: REVERSE SHOULDER ARTHROPLASTY;  Surgeon: Hiram Gash, MD;  Location: [facility];  Service: Orthopedics;  Laterality: Right;   SHOULDER OPEN ROTATOR CUFF REPAIR Bilateral 2001   TONSILLECTOMY     VIDEO ASSISTED THORACOSCOPY Right 02/19/2018   Procedure: VIDEO ASSISTED THORACOSCOPY;  Surgeon: [name], MD;  Location: [facility];  Service: Thoracic;  Laterality: Right;   Patient Active Problem List   Diagnosis Date Noted   Spinal stenosis of lumbar region with neurogenic claudication 12/22/2020   Pre-operative respiratory examination 12/13/2020   Cervical spondylosis with myelopathy 11/10/2019   Shortness of breath 08/04/2019   Colostomy in place ([place]) 01/26/2019   Prolonged Q-T interval on ECG 06/25/2018   Immunosuppression due to drug therapy for ILD 06/16/2018   Sigmoid colon perforation s/p Hartmann colectomy/colostomy 06/15/2018 06/15/2018   Perforated diverticulum of large intestine 06/15/2018   OSA (obstructive sleep apnea) 12/13/2016   Snoring 10/26/2016   PAF (paroxysmal atrial fibrillation) ([place]) 08/14/2016   ILD (interstitial lung disease) ([place]) 05/01/2016   Lumbar stenosis 02/01/2015   Giardia 04/14/2014   H/O viral cardiomyopathy 04/14/2014   Herniated nucleus pulposus, L3-4 right 05/05/2013   Other hypertrophic cardiomyopathy ([place]) 04/19/2013   Hyperlipidemia 04/19/2013   Essential hypertension 04/19/2013   Abnormal LFTs 09/16/2012    Nausea 07/10/2011   Chronic diarrhea 07/10/2011   Burping 07/10/2011   Osteoporosis 07/10/2011   Arthritis 07/10/2011    ONSET DATE: 07/11/22  REFERRING DIAG: R Reverse Total Shoulder Arthoscopy  THERAPY DIAG:  Acute pain of right shoulder  Shoulder stiffness, right  Other symptoms and signs involving the musculoskeletal system  Rationale for Evaluation and Treatment: Rehabilitation  SUBJECTIVE:   SUBJECTIVE STATEMENT: S: "I'm getting my arm out more" Pt accompanied by: self and significant other  PERTINENT HISTORY: Patient reports medical history significant for pulmonary fibrosis, interstitial lung disease.   PRECAUTIONS: Shoulder  WEIGHT BEARING RESTRICTIONS: Yes No Weight Bearing  PAIN:  Are you having pain? No  FALLS: Has patient fallen in last 6 months? Yes. Number of falls 1 - tripping over a stool  PATIENT GOALS: "To get ROM back in the arm"  NEXT MD VISIT: 07/20/22  OBJECTIVE:   HAND DOMINANCE: Right  ADLs: Overall ADLs: Pt requiring mod-max assist for all ADL's and IADL's at this time. He is unable to  lift items or sustain holds on items due to weight.   FUNCTIONAL OUTCOME MEASURES: FOTO: 34.11  UPPER EXTREMITY ROM:     Passive ROM Right eval  Shoulder flexion 123  Shoulder abduction 115  Shoulder internal rotation 90  Shoulder external rotation -9  (Blank rows = not tested)  UPPER EXTREMITY MMT:     MMT Right eval  Shoulder flexion   Shoulder abduction   Shoulder adduction   Shoulder extension   Shoulder internal rotation   Shoulder external rotation   (Blank rows = not tested)   EDEMA: Mild to moderate swelling noted around surgical area  OBSERVATIONS: Severe Fascial restrictions noted in subscapularis, biceps, trapezius, and axillary region.   TODAY'S TREATMENT:                                                                                                                              DATE:   08/09/22 -Manual therapy:  myofascial release and trigger point applied to biceps, trapezius, scapula, and axillary region in order to address pain and fascial restrictions to improve ROM. -AA/ROM: flexion, abduction, protraction, horizontal abduction, er/IR, x10 -wall slides: flexion x10, abduction with min A x10 -Scapular ROM: retraction, rows, elevation/depression, x10 -Pulleys: flexion, abduction, x60"  08/02/22 -Manual therapy: myofascial release and trigger point applied to biceps, trapezius, scapula, and axillary region in order to address pain and fascial restrictions to improve ROM. -P/ROM: flexion, abduction, horizontal abduction, and er/IR, x15 -[employer]: flexion, abduction in scaption, x10 each -Pulleys: flexion, abduction, x60"  07/31/22 -Manual therapy: myofascial release and trigger point applied to biceps, trapezius, scapula, and axillary region in order to address pain and fascial restrictions to improve ROM. -P/ROM: flexion, abduction, horizontal abduction, and er/IR, x15 -Ball Rolls: flexion, abduction in scaption, x10 each    PATIENT EDUCATION: Education details: AA/ROM Person educated: Patient Education method: Consulting civil engineer, Media planner, and Handouts Education comprehension: verbalized understanding and returned demonstration  HOME EXERCISE PROGRAM: 3/1: Pendulums 3/5: Table Slides 3/12: Isometrics 3/28: AA/ROM  GOALS: Goals reviewed with patient? Yes  SHORT TERM GOALS: Target date: 08/17/22  Pt will be provided with and educated on HEP to improve mobility in RUE required for use during ADL completion.  Goal status: IN PROGRESS  2.   Pt will increase RUE P/ROM by 40 degrees or greater to improve ability to use LUE during dressing tasks with minimal compensatory techniques.  Goal status: IN PROGRESS  3.  Pt will increase LUE strength to 3+/5 to improve ability to reach for items at waist to chest height during bathing and grooming tasks.  Goal status: IN PROGRESS   LONG TERM  GOALS: Target date: 09/14/22  Pt will decrease pain in RUE to 3/10 or less to improve ability to sleep for 2+ consecutive hours without waking due to pain.  Goal status: IN PROGRESS  2.  Pt will decrease RUE fascial restrictions to min amounts or less to improve mobility required for functional reaching  tasks.  Goal status: IN PROGRESS  3.  Pt will increase RUE A/ROM to at least 145 for flexion/abduction, and 50 degrees er/IR to improve ability to use RUE when reaching overhead or behind back during dressing and bathing tasks.  Goal status: IN PROGRESS  4.  Pt will increase LUE strength to 4+/5 or greater to improve ability to use LUE when lifting or carrying items during meal preparation/housework/yardwork tasks. Goal status: IN PROGRESS  5.  Pt will return to highest level of function using LUE as non-dominant during functional task completion.  Goal status: IN PROGRESS   ASSESSMENT:  CLINICAL IMPRESSION: Pt progressed to phase 2 of his protocol this session. He has reached 4 weeks s/p surgery and is reporting minimal pain. This session started AA/ROM, which he was able to complete in supine with 80% of full ROM and minimal pain. He demonstrates good movement pattern with minimal compensatory strategies. When completing wall slides, he was able to do flexion independently, however he was unable to complete abduction without min assist from OT, stating that he felt weak along his bicep and anterior bicep. Per his protocol pt is able to remove sling and wear for comfort only now, pt expressed understanding. OT providing hands on assist as needed, as well as verbal and tactile cuing for positioning and technique.   PERFORMANCE DEFICITS: in functional skills including ADLs, IADLs, edema, ROM, strength, pain, fascial restrictions, Gross motor control, body mechanics, and UE functional use.   PLAN:  OT FREQUENCY: 2x/week  OT DURATION: 8 weeks  PLANNED INTERVENTIONS: self care/ADL training,  therapeutic exercise, therapeutic activity, manual therapy, scar mobilization, passive range of motion, functional mobility training, electrical stimulation, ultrasound, moist heat, cryotherapy, patient/family education, energy conservation, coping strategies training, and DME and/or AE instructions  CONSULTED AND AGREED WITH PLAN OF CARE: Patient  PLAN FOR NEXT SESSION: Manual Therapy, P/ROM, Thumb tacs, Table slides, low level wall washes, Wall Slides, AA/ROM, Start low level scap strengthening, continue phase 2 of protocol.     Paulita Fujita, OTR/L [phone] 08/09/2022, 10:49 AM

## 2022-08-14 ENCOUNTER — Encounter (HOSPITAL_COMMUNITY): Payer: PPO | Admitting: Occupational Therapy

## 2022-08-14 DIAGNOSIS — M19011 Primary osteoarthritis, right shoulder: Secondary | ICD-10-CM | POA: Diagnosis not present

## 2022-08-16 ENCOUNTER — Encounter (HOSPITAL_COMMUNITY): Payer: Self-pay | Admitting: Occupational Therapy

## 2022-08-16 ENCOUNTER — Ambulatory Visit (HOSPITAL_COMMUNITY): Payer: PPO | Attending: Orthopaedic Surgery | Admitting: Occupational Therapy

## 2022-08-16 DIAGNOSIS — M25511 Pain in right shoulder: Secondary | ICD-10-CM | POA: Diagnosis not present

## 2022-08-16 DIAGNOSIS — R29898 Other symptoms and signs involving the musculoskeletal system: Secondary | ICD-10-CM | POA: Diagnosis not present

## 2022-08-16 DIAGNOSIS — M25611 Stiffness of right shoulder, not elsewhere classified: Secondary | ICD-10-CM | POA: Diagnosis not present

## 2022-08-16 NOTE — Patient Instructions (Signed)

## 2022-08-16 NOTE — Therapy (Signed)
OUTPATIENT OCCUPATIONAL THERAPY ORTHO TREATMENT NOTE  Patient Name: [name] MRN: [id number] DOB:[birth year], 77 y.o., male Today's Date: 08/16/2022  PCP: [name], MD REFERRING PROVIDER: Ophelia Charter, MD  END OF SESSION:  OT End of Session - 08/16/22 1036     Visit Number 9    Number of Visits 17    Date for OT Re-Evaluation 09/14/22    Authorization Type [employer]    Progress Note Due on Visit 10    OT Start Time 1035    OT Stop Time 1115    OT Time Calculation (min) 40 min    Activity Tolerance Patient tolerated treatment well    Behavior During Therapy [facility] for tasks assessed/performed              Past Medical History:  Diagnosis Date   Allergy to alpha-gal    2015; has been able to re-introduce red meat for the past 2 1/2 years without reaction (as of 01/21/19)   Arthritis    Chest pain    a. 03/2017: echo showing EF of 60-65%, no regional WMA or significant valve abnormalities. b. 03/2016: NST with no evidence of ischemia.    Complication of anesthesia    "anaphylactic" reaction to Xylocaine > 10 years ago; reported later recieved marcaine without issue   Difficult intubation 02/19/2018   No difficulty 01/26/19 with Mac and 3 or 11/10/19 with Glidescope   Digestive problems    on Prednisone prn for this issue- diarrhea   Dyspnea    pulmonary fibrosis   Dysrhythmia    irregular due to Myocarditis- takes Atenolol, Norvasc   GERD (gastroesophageal reflux disease)    H/O viral myocarditis    25 years ago   Head injury, closed, with concussion    Breif LOC   Hyperlipidemia    Hypertension    Mild mitral valve prolapse    per Dr Evette Georges notes   PAF (paroxysmal atrial fibrillation)    a. initially occuring in 02/2016. b. recurrent in 07/2016. Placed on Eliquis   Polymyalgia rheumatica    PONV (postoperative nausea and vomiting)    Pre-diabetes    elevated due to high dose of prednsione for back no longer elevated   Pulmonary fibrosis     Sleep apnea    no cpap mild   Past Surgical History:  Procedure Laterality Date   ANTERIOR CERVICAL DECOMP/DISCECTOMY FUSION N/A 11/10/2019   Procedure: Cervical Three-Four Anterior cervical decompression/discectomy/fusion;  Surgeon: [name], MD;  Location: [place];  Service: Neurosurgery;  Laterality: N/A;  Cervical Three-Four Anterior cervical decompression/discectomy/fusion   apendectomy  1965   APPENDECTOMY     APPLICATION OF ROBOTIC ASSISTANCE FOR SPINAL PROCEDURE N/A 12/22/2020   Procedure: APPLICATION OF ROBOTIC ASSISTANCE FOR SPINAL PROCEDURE;  Surgeon: [name], MD;  Location: [place];  Service: Neurosurgery;  Laterality: N/A;   BACK SURGERY     bone spur Bilateral 1999   feet   CARDIAC CATHETERIZATION  2001   CHOLECYSTECTOMY  2004   COLON RESECTION N/A 06/15/2018   Procedure: SIGMOID COLON RESECTION;  Surgeon: [name], MD;  Location: [facility];  Service: General;  Laterality: N/A;   COLONOSCOPY     COLOSTOMY N/A 06/15/2018   Procedure: COLOSTOMY;  Surgeon: [name], MD;  Location: [facility];  Service: General;  Laterality: N/A;   COLOSTOMY TAKEDOWN N/A 01/26/2019   Procedure: LAPAROSCOPIC ASSISTED COLOSTOMY TAKEDOWN;  Surgeon: [name], MD;  Location: [place];  Service:  General;  Laterality: N/A;   EYE SURGERY Bilateral    cataract removal   LAPAROSCOPIC LYSIS OF ADHESIONS N/A 01/26/2019   Procedure: LAPAROSCOPIC LYSIS OF ADHESIONS;  Surgeon: [name], MD;  Location: [place];  Service: General;  Laterality: N/A;   LAPAROTOMY N/A 06/15/2018   Procedure: EXPLORATORY LAPAROTOMY;  Surgeon: [name], MD;  Location: [facility];  Service: General;  Laterality: N/A;   LUMBAR LAMINECTOMY/ DECOMPRESSION WITH MET-RX Right 05/05/2013   Procedure: Right Lumbar three-four Extraforaminal Microdiskectomy with Metrex;  Surgeon: [name], MD;  Location: [place] [facility];  Service: Neurosurgery;  Laterality: Right;  Right Lumbar three-four Extraforaminal Microdiskectomy with  Metrex   LUNG BIOPSY Right 02/19/2018   Procedure: LUNG BIOPSY;  Surgeon: [name], MD;  Location: [place];  Service: Thoracic;  Laterality: Right;   POSTERIOR LUMBAR FUSION 4 LEVEL N/A 12/22/2020   Procedure: Lumbar one-two Posterior lumbar interbody fusion with stabilization from Thoracic ten to Lumbar one with robotic screw placement;  Surgeon: [name], MD;  Location: [place];  Service: Neurosurgery;  Laterality: N/A;  RM 20   REVERSE SHOULDER ARTHROPLASTY Right 07/11/2022   Procedure: REVERSE SHOULDER ARTHROPLASTY;  Surgeon: Hiram Gash, MD;  Location: [facility];  Service: Orthopedics;  Laterality: Right;   SHOULDER OPEN ROTATOR CUFF REPAIR Bilateral 2001   TONSILLECTOMY     VIDEO ASSISTED THORACOSCOPY Right 02/19/2018   Procedure: VIDEO ASSISTED THORACOSCOPY;  Surgeon: [name], MD;  [facility];  Service: Thoracic;  Laterality: Right;   Patient Active Problem List   Diagnosis Date Noted   Spinal stenosis of lumbar region with neurogenic claudication 12/22/2020   Pre-operative respiratory examination 12/13/2020   Cervical spondylosis with myelopathy 11/10/2019   Shortness of breath 08/04/2019   Colostomy in place 01/26/2019   Prolonged Q-T interval on ECG 06/25/2018   Immunosuppression due to drug therapy for ILD 06/16/2018   Sigmoid colon perforation s/p Hartmann colectomy/colostomy 06/15/2018 06/15/2018   Perforated diverticulum of large intestine 06/15/2018   OSA (obstructive sleep apnea) 12/13/2016   Snoring 10/26/2016   PAF (paroxysmal atrial fibrillation) 08/14/2016   ILD (interstitial lung disease) 05/01/2016   Lumbar stenosis 02/01/2015   Giardia 04/14/2014   H/O viral cardiomyopathy 04/14/2014   Herniated nucleus pulposus, L3-4 right 05/05/2013   Other hypertrophic cardiomyopathy ([place]) 04/19/2013   Hyperlipidemia 04/19/2013   Essential hypertension 04/19/2013   Abnormal LFTs 09/16/2012   Nausea 07/10/2011   Chronic diarrhea 07/10/2011    Burping 07/10/2011   Osteoporosis 07/10/2011   Arthritis 07/10/2011    ONSET DATE: 07/11/22  REFERRING DIAG: R Reverse Total Shoulder Arthoscopy  THERAPY DIAG:  Acute pain of right shoulder  Shoulder stiffness, right  Other symptoms and signs involving the musculoskeletal system  Rationale for Evaluation and Treatment: Rehabilitation  SUBJECTIVE:   SUBJECTIVE STATEMENT: S: "I'm getting my arm out more" Pt accompanied by: self and significant other  PERTINENT HISTORY: Patient reports medical history significant for pulmonary fibrosis, interstitial lung disease.   PRECAUTIONS: Shoulder  WEIGHT BEARING RESTRICTIONS: Yes No Weight Bearing  PAIN:  Are you having pain? No  FALLS: Has patient fallen in last 6 months? Yes. Number of falls 1 - tripping over a stool  PATIENT GOALS: "To get ROM back in the arm"  NEXT MD VISIT: 07/20/22  OBJECTIVE:   HAND DOMINANCE: Right  ADLs: Overall ADLs: Pt requiring mod-max assist for all ADL's and IADL's at this time. He is unable to lift items or sustain holds on  items due to weight.   FUNCTIONAL OUTCOME MEASURES: FOTO: 34.11  UPPER EXTREMITY ROM:     Passive ROM Right eval  Shoulder flexion 123  Shoulder abduction 115  Shoulder internal rotation 90  Shoulder external rotation -9  (Blank rows = not tested)  UPPER EXTREMITY MMT:     MMT Right eval  Shoulder flexion   Shoulder abduction   Shoulder adduction   Shoulder extension   Shoulder internal rotation   Shoulder external rotation   (Blank rows = not tested)   EDEMA: Mild to moderate swelling noted around surgical area  OBSERVATIONS: Severe Fascial restrictions noted in subscapularis, biceps, trapezius, and axillary region.   TODAY'S TREATMENT:                                                                                                                              DATE:   08/16/22 -Manual therapy: myofascial release and trigger point applied to biceps,  trapezius, scapula, and axillary region in order to address pain and fascial restrictions to improve ROM. -AA/ROM: supine, flexion, abduction, protraction, horizontal abduction, er/IR, x10 -A/ROM: supine, flexion, abduction, protraction, horizontal abduction, er/IR, x10 -Wall Slides: flexion, abduction, x10 -Proximal shoulder exercise: flexion at 90 degrees on door, x60"  08/09/22 -Manual therapy: myofascial release and trigger point applied to biceps, trapezius, scapula, and axillary region in order to address pain and fascial restrictions to improve ROM. -AA/ROM: flexion, abduction, protraction, horizontal abduction, er/IR, x10 -wall slides: flexion x10, abduction with min A x10 -Scapular ROM: retraction, rows, elevation/depression, x10 -Pulleys: flexion, abduction, x60"  08/02/22 -Manual therapy: myofascial release and trigger point applied to biceps, trapezius, scapula, and axillary region in order to address pain and fascial restrictions to improve ROM. -P/ROM: flexion, abduction, horizontal abduction, and er/IR, x15 -Triad HospitalsBall Rolls: flexion, abduction in scaption, x10 each -Pulleys: flexion, abduction, x60"    PATIENT EDUCATION: Education details: [employer] Slides and A/ROM Person educated: Patient Education method: Programmer, multimediaxplanation, Demonstration, and Handouts Education comprehension: verbalized understanding and returned demonstration  HOME EXERCISE PROGRAM: 3/1: Pendulums 3/5: Table Slides 3/12: Isometrics 3/28: AA/ROM 4/3: Wall Slides and A/ROM  GOALS: Goals reviewed with patient? Yes  SHORT TERM GOALS: Target date: 08/17/22  Pt will be provided with and educated on HEP to improve mobility in RUE required for use during ADL completion.  Goal status: IN PROGRESS  2.   Pt will increase RUE P/ROM by 40 degrees or greater to improve ability to use LUE during dressing tasks with minimal compensatory techniques.  Goal status: IN PROGRESS  3.  Pt will increase LUE strength to 3+/5 to  improve ability to reach for items at waist to chest height during bathing and grooming tasks.  Goal status: IN PROGRESS   LONG TERM GOALS: Target date: 09/14/22  Pt will decrease pain in RUE to 3/10 or less to improve ability to sleep for 2+ consecutive hours without waking due to pain.  Goal status: IN PROGRESS  2.  Pt will decrease RUE fascial restrictions to min amounts or less to improve mobility required for functional reaching tasks.  Goal status: IN PROGRESS  3.  Pt will increase RUE A/ROM to at least 145 for flexion/abduction, and 50 degrees er/IR to improve ability to use RUE when reaching overhead or behind back during dressing and bathing tasks.  Goal status: IN PROGRESS  4.  Pt will increase LUE strength to 4+/5 or greater to improve ability to use LUE when lifting or carrying items during meal preparation/housework/yardwork tasks. Goal status: IN PROGRESS  5.  Pt will return to highest level of function using LUE as non-dominant during functional task completion.  Goal status: IN PROGRESS   ASSESSMENT:  CLINICAL IMPRESSION: Pt is continuing to improve his overall ROM in supine. This session he started A/ROM with good form and movement pattern. Overall he was achieving 80-85% of full ROM with AA/ROM and 70-75% of full ROM with A/ROM. OT initiated proximal shoulder exercises this session to begin working on muscle endurance within the shoulder girdle, for overall stabilization and reducing muscle fatigue. Verbal and tactile cuing provided this session for positioning and technique.   PERFORMANCE DEFICITS: in functional skills including ADLs, IADLs, edema, ROM, strength, pain, fascial restrictions, Gross motor control, body mechanics, and UE functional use.   PLAN:  OT FREQUENCY: 2x/week  OT DURATION: 8 weeks  PLANNED INTERVENTIONS: self care/ADL training, therapeutic exercise, therapeutic activity, manual therapy, scar mobilization, passive range of motion, functional  mobility training, electrical stimulation, ultrasound, moist heat, cryotherapy, patient/family education, energy conservation, coping strategies training, and DME and/or AE instructions  CONSULTED AND AGREED WITH PLAN OF CARE: Patient  PLAN FOR NEXT SESSION: Manual Therapy, P/ROM, Thumb tacs, Table slides, low level wall washes, Wall Slides, AA/ROM, Start low level scap strengthening, continue phase 2 of protocol.     Trish Mage, OTR/L [phone] 08/16/2022, 10:37 AM

## 2022-08-17 ENCOUNTER — Other Ambulatory Visit (HOSPITAL_COMMUNITY): Payer: Self-pay | Admitting: Internal Medicine

## 2022-08-17 DIAGNOSIS — R102 Pelvic and perineal pain: Secondary | ICD-10-CM

## 2022-08-20 ENCOUNTER — Encounter (HOSPITAL_COMMUNITY): Payer: Self-pay | Admitting: Internal Medicine

## 2022-08-20 DIAGNOSIS — M8080XD Other osteoporosis with current pathological fracture, unspecified site, subsequent encounter for fracture with routine healing: Secondary | ICD-10-CM | POA: Diagnosis not present

## 2022-08-21 ENCOUNTER — Ambulatory Visit (HOSPITAL_COMMUNITY): Payer: PPO | Admitting: Occupational Therapy

## 2022-08-21 ENCOUNTER — Encounter (HOSPITAL_COMMUNITY): Payer: Self-pay | Admitting: Occupational Therapy

## 2022-08-21 DIAGNOSIS — M25511 Pain in right shoulder: Secondary | ICD-10-CM

## 2022-08-21 DIAGNOSIS — M25611 Stiffness of right shoulder, not elsewhere classified: Secondary | ICD-10-CM

## 2022-08-21 DIAGNOSIS — R29898 Other symptoms and signs involving the musculoskeletal system: Secondary | ICD-10-CM

## 2022-08-21 NOTE — Therapy (Signed)
OUTPATIENT OCCUPATIONAL THERAPY ORTHO TREATMENT NOTE  Patient Name: [name] MRN: [id number] DOB:December 08, 1945, 77 y.o., male Today's Date: 08/21/2022  PCP: Benita Stabile, MD REFERRING PROVIDER: Ramond Marrow, MD  END OF SESSION:  OT End of Session - 08/21/22 1033     Visit Number 10    Number of Visits 17    Date for OT Re-Evaluation 09/14/22    Authorization Type [employer]    Progress Note Due on Visit 10    OT Start Time 1033    OT Stop Time 1115    OT Time Calculation (min) 42 min    Activity Tolerance Patient tolerated treatment well    Behavior During Therapy [facility] for tasks assessed/performed              Past Medical History:  Diagnosis Date  . Allergy to alpha-gal    2015; has been able to re-introduce red meat for the past 2 1/2 years without reaction (as of 01/21/19)  . Arthritis   . Chest pain    a. 03/2017: echo showing EF of 60-65%, no regional WMA or significant valve abnormalities. b. 03/2016: NST with no evidence of ischemia.   . Complication of anesthesia    "anaphylactic" reaction to Xylocaine > 10 years ago; reported later recieved marcaine without issue  . Difficult intubation 02/19/2018   No difficulty 01/26/19 with Mac and 3 or 11/10/19 with Glidescope  . Digestive problems    on Prednisone prn for this issue- diarrhea  . Dyspnea    pulmonary fibrosis  . Dysrhythmia    irregular due to Myocarditis- takes Atenolol, Norvasc  . GERD (gastroesophageal reflux disease)   . H/O viral myocarditis    25 years ago  . Head injury, closed, with concussion    Breif LOC  . Hyperlipidemia   . Hypertension   . Mild mitral valve prolapse    per Dr Landry Dyke notes  . PAF (paroxysmal atrial fibrillation)    a. initially occuring in 02/2016. b. recurrent in 07/2016. Placed on Eliquis  . Polymyalgia rheumatica   . PONV (postoperative nausea and vomiting)   . Pre-diabetes    elevated due to high dose of prednsione for back no longer elevated  .  Pulmonary fibrosis   . Sleep apnea    no cpap mild   Past Surgical History:  Procedure Laterality Date  . ANTERIOR CERVICAL DECOMP/DISCECTOMY FUSION N/A 11/10/2019   Procedure: Cervical Three-Four Anterior cervical decompression/discectomy/fusion;  Surgeon: Barnett Abu, MD;  Location: [facility] OR;  Service: Neurosurgery;  Laterality: N/A;  Cervical Three-Four Anterior cervical decompression/discectomy/fusion  . apendectomy  1965  . APPENDECTOMY    . APPLICATION OF ROBOTIC ASSISTANCE FOR SPINAL PROCEDURE N/A 12/22/2020   Procedure: APPLICATION OF ROBOTIC ASSISTANCE FOR SPINAL PROCEDURE;  Surgeon: Barnett Abu, MD;  Location: [facility] OR;  Service: Neurosurgery;  Laterality: N/A;  . BACK SURGERY    . bone spur Bilateral 1999   feet  . CARDIAC CATHETERIZATION  2001  . CHOLECYSTECTOMY  2004  . COLON RESECTION N/A 06/15/2018   Procedure: SIGMOID COLON RESECTION;  Surgeon: Griselda Miner, MD;  Location: [facility] ORS;  Service: General;  Laterality: N/A;  . COLONOSCOPY    . COLOSTOMY N/A 06/15/2018   Procedure: COLOSTOMY;  Surgeon: Griselda Miner, MD;  Location: [facility] ORS;  Service: General;  Laterality: N/A;  . COLOSTOMY TAKEDOWN N/A 01/26/2019   Procedure: LAPAROSCOPIC ASSISTED COLOSTOMY TAKEDOWN;  Surgeon: Griselda Miner, MD;  Location: [facility] OR;  Service:  General;  Laterality: N/A;  . EYE SURGERY Bilateral    cataract removal  . LAPAROSCOPIC LYSIS OF ADHESIONS N/A 01/26/2019   Procedure: LAPAROSCOPIC LYSIS OF ADHESIONS;  Surgeon: Griselda Miner, MD;  Location: [facility] OR;  Service: General;  Laterality: N/A;  . LAPAROTOMY N/A 06/15/2018   Procedure: EXPLORATORY LAPAROTOMY;  Surgeon: Griselda Miner, MD;  Location: [facility] ORS;  Service: General;  Laterality: N/A;  . LUMBAR LAMINECTOMY/ DECOMPRESSION WITH MET-RX Right 05/05/2013   Procedure: Right Lumbar three-four Extraforaminal Microdiskectomy with Metrex;  Surgeon: Barnett Abu, MD;  Location: [facility] ORS;  Service: Neurosurgery;  Laterality: Right;  Right Lumbar three-four  Extraforaminal Microdiskectomy with Metrex  . LUNG BIOPSY Right 02/19/2018   Procedure: LUNG BIOPSY;  Surgeon: Loreli Slot, MD;  Location: [facility] OR;  Service: Thoracic;  Laterality: Right;  . POSTERIOR LUMBAR FUSION 4 LEVEL N/A 12/22/2020   Procedure: Lumbar one-two Posterior lumbar interbody fusion with stabilization from Thoracic ten to Lumbar one with robotic screw placement;  Surgeon: Barnett Abu, MD;  Location: [facility] OR;  Service: Neurosurgery;  Laterality: N/A;  RM 20  . REVERSE SHOULDER ARTHROPLASTY Right 07/11/2022   Procedure: REVERSE SHOULDER ARTHROPLASTY;  Surgeon: Bjorn Pippin, MD;  Location: [facility] ORS;  Service: Orthopedics;  Laterality: Right;  . SHOULDER OPEN ROTATOR CUFF REPAIR Bilateral 2001  . TONSILLECTOMY    . VIDEO ASSISTED THORACOSCOPY Right 02/19/2018   Procedure: VIDEO ASSISTED THORACOSCOPY;  Surgeon: Loreli Slot, MD;  Location: [facility] OR;  Service: Thoracic;  Laterality: Right;   Patient Active Problem List   Diagnosis Date Noted  . Spinal stenosis of lumbar region with neurogenic claudication 12/22/2020  . Pre-operative respiratory examination 12/13/2020  . Cervical spondylosis with myelopathy 11/10/2019  . Shortness of breath 08/04/2019  . Colostomy in place 01/26/2019  . Prolonged Q-T interval on ECG 06/25/2018  . 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 Sigmoid colon perforation s/p Hartmann colectomy/colostomy 06/15/2018 06/15/2018  . Perforated diverticulum of large intestine 06/15/2018  . OSA (obstructive sleep apnea) 12/13/2016  . Snoring 10/26/2016  . PAF (paroxysmal atrial fibrillation) 08/14/2016  . ILD (interstitial lung disease) 05/01/2016  . Lumbar stenosis 02/01/2015  . Giardia 04/14/2014  . H/O viral cardiomyopathy 04/14/2014  . Herniated nucleus pulposus, L3-4 right 05/05/2013  . Other hypertrophic cardiomyopathy (HCC) 04/19/2013  . Hyperlipidemia 04/19/2013  . Essential hypertension 04/19/2013  . Abnormal LFTs  09/16/2012  . Nausea 07/10/2011  . Chronic diarrhea 07/10/2011  . Burping 07/10/2011  . Osteoporosis 07/10/2011  . Arthritis 07/10/2011    ONSET DATE: 07/11/22  REFERRING DIAG: R Reverse Total Shoulder Arthoscopy  THERAPY DIAG:  Acute pain of right shoulder  Shoulder stiffness, right  Other symptoms and signs involving the musculoskeletal system  Rationale for Evaluation and Treatment: Rehabilitation  SUBJECTIVE:   SUBJECTIVE STATEMENT: S: "I worked it a lot this weekend" Pt accompanied by: self and significant other  PERTINENT HISTORY: Patient reports medical history significant for pulmonary fibrosis, interstitial lung disease.   PRECAUTIONS: Shoulder  WEIGHT BEARING RESTRICTIONS: Yes No Weight Bearing  PAIN:  Are you having pain? Yes: NPRS scale: 4/10 Pain description: sore and tight Aggravating factors: anterior shoulder girdle Relieving factors: rest  FALLS: Has patient fallen in last 6 months? Yes. Number of falls 1 - tripping over a stool  PATIENT GOALS: "To get ROM back in the arm"  NEXT MD VISIT: unsure  OBJECTIVE:   HAND DOMINANCE: Right  ADLs: Overall ADLs: Pt requiring mod-max assist for  all ADL's and IADL's at this time. He is unable to lift items or sustain holds on items due to weight.   FUNCTIONAL OUTCOME MEASURES: FOTO: 34.11 08/21/22: 62.18  UPPER EXTREMITY ROM:     Passive ROM Right eval  Shoulder flexion 123  Shoulder abduction 115  Shoulder internal rotation 90  Shoulder external rotation -9  (Blank rows = not tested)  Active ROM seated Right eval Right 08/21/22  Shoulder flexion  115  Shoulder abduction  110  Shoulder internal rotation  90  Shoulder external rotation  41  (Blank rows = not tested)  UPPER EXTREMITY MMT:     MMT Right eval Right 08/21/22  Shoulder flexion  4-/5  Shoulder abduction  4-/5  Shoulder adduction  4/5  Shoulder extension  4/5  Shoulder internal rotation  3+/5  Shoulder external rotation  4-/5   (Blank rows = not tested)   EDEMA: Mild to moderate swelling noted around surgical area  OBSERVATIONS: Severe Fascial restrictions noted in subscapularis, biceps, trapezius, and axillary region.   TODAY'S TREATMENT:                                                                                                                              DATE:   08/21/22 -Manual therapy: myofascial release and trigger point applied to biceps, trapezius, scapula, and axillary region in order to address pain and fascial restrictions to improve ROM. -AA/ROM: seated, flexion, abduction, protraction, horizontal abduction, er/IR, x10 -A/ROM: seated, flexion, abduction, protraction, horizontal abduction, er/IR, x10 -Ball roll on the wall: flexion x10 -Scapular strengthening: red band  08/16/22 -Manual therapy: myofascial release and trigger point applied to biceps, trapezius, scapula, and axillary region in order to address pain and fascial restrictions to improve ROM. -AA/ROM: supine, flexion, abduction, protraction, horizontal abduction, er/IR, x10 -A/ROM: supine, flexion, abduction, protraction, horizontal abduction, er/IR, x10 -Wall Slides: flexion, abduction, x10 -Proximal shoulder exercise: flexion at 90 degrees on door, x60"  08/09/22 -Manual therapy: myofascial release and trigger point applied to biceps, trapezius, scapula, and axillary region in order to address pain and fascial restrictions to improve ROM. -AA/ROM: flexion, abduction, protraction, horizontal abduction, er/IR, x10 -wall slides: flexion x10, abduction with min A x10 -Scapular ROM: retraction, rows, elevation/depression, x10 -Pulleys: flexion, abduction, x60"  08/02/22 -Manual therapy: myofascial release and trigger point applied to biceps, trapezius, scapula, and axillary region in order to address pain and fascial restrictions to improve ROM. -P/ROM: flexion, abduction, horizontal abduction, and er/IR, x15 -[facility]: flexion,  abduction in scaption, x10 each -Pulleys: flexion, abduction, x60"    PATIENT EDUCATION: Education details: [employer] and A/ROM Person educated: Patient Education method: Programmer, multimedia, Demonstration, and Handouts Education comprehension: verbalized understanding and returned demonstration  HOME EXERCISE PROGRAM: 3/1: Pendulums 3/5: Table Slides 3/12: Isometrics 3/28: AA/ROM 4/3: Wall Slides and A/ROM  GOALS: Goals reviewed with patient? Yes  SHORT TERM GOALS: Target date: 08/17/22  Pt will be provided with and educated on HEP  to improve mobility in RUE required for use during ADL completion.  Goal status: IN PROGRESS  2.   Pt will increase RUE P/ROM by 40 degrees or greater to improve ability to use LUE during dressing tasks with minimal compensatory techniques.  Goal status: IN PROGRESS  3.  Pt will increase LUE strength to 3+/5 to improve ability to reach for items at waist to chest height during bathing and grooming tasks.  Goal status: IN PROGRESS   LONG TERM GOALS: Target date: 09/14/22  Pt will decrease pain in RUE to 3/10 or less to improve ability to sleep for 2+ consecutive hours without waking due to pain.  Goal status: IN PROGRESS  2.  Pt will decrease RUE fascial restrictions to min amounts or less to improve mobility required for functional reaching tasks.  Goal status: IN PROGRESS  3.  Pt will increase RUE A/ROM to at least 145 for flexion/abduction, and 50 degrees er/IR to improve ability to use RUE when reaching overhead or behind back during dressing and bathing tasks.  Goal status: IN PROGRESS  4.  Pt will increase LUE strength to 4+/5 or greater to improve ability to use LUE when lifting or carrying items during meal preparation/housework/yardwork tasks. Goal status: IN PROGRESS  5.  Pt will return to highest level of function using LUE as non-dominant during functional task completion.  Goal status: IN PROGRESS   ASSESSMENT:  CLINICAL  IMPRESSION: Pt is continuing to improve his overall ROM in supine. This session he started A/ROM with good form and movement pattern. Overall he was achieving 80-85% of full ROM with AA/ROM and 70-75% of full ROM with A/ROM. OT initiated proximal shoulder exercises this session to begin working on muscle endurance within the shoulder girdle, for overall stabilization and reducing muscle fatigue. Verbal and tactile cuing provided this session for positioning and technique.   PERFORMANCE DEFICITS: in functional skills including ADLs, IADLs, edema, ROM, strength, pain, fascial restrictions, Gross motor control, body mechanics, and UE functional use.   PLAN:  OT FREQUENCY: 2x/week  OT DURATION: 8 weeks  PLANNED INTERVENTIONS: self care/ADL training, therapeutic exercise, therapeutic activity, manual therapy, scar mobilization, passive range of motion, functional mobility training, electrical stimulation, ultrasound, moist heat, cryotherapy, patient/family education, energy conservation, coping strategies training, and DME and/or AE instructions  CONSULTED AND AGREED WITH PLAN OF CARE: Patient  PLAN FOR NEXT SESSION: Manual Therapy, P/ROM, Thumb tacs, Table slides, low level wall washes, Wall Slides, AA/ROM, Start low level scap strengthening, continue phase 2 of protocol.     Trish MageClara Jaleiyah Alas, OTR/L [phone] 08/21/2022, 10:34 AM

## 2022-08-21 NOTE — Patient Instructions (Signed)

## 2022-08-22 DIAGNOSIS — M8080XD Other osteoporosis with current pathological fracture, unspecified site, subsequent encounter for fracture with routine healing: Secondary | ICD-10-CM | POA: Diagnosis not present

## 2022-08-23 ENCOUNTER — Ambulatory Visit (HOSPITAL_COMMUNITY): Payer: PPO | Admitting: Occupational Therapy

## 2022-08-23 DIAGNOSIS — M25611 Stiffness of right shoulder, not elsewhere classified: Secondary | ICD-10-CM

## 2022-08-23 DIAGNOSIS — M25511 Pain in right shoulder: Secondary | ICD-10-CM

## 2022-08-23 DIAGNOSIS — R29898 Other symptoms and signs involving the musculoskeletal system: Secondary | ICD-10-CM

## 2022-08-23 NOTE — Therapy (Signed)
OUTPATIENT OCCUPATIONAL THERAPY ORTHO TREATMENT NOTE  Patient Name: [name] MRN: [id number] DOB:06-05-1945, 77 y.o., male Today's Date: 08/23/2022  PCP: Benita StabileHall, John Z, MD REFERRING PROVIDER: Ramond MarrowVarkey, Dax, MD   END OF SESSION:  OT End of Session - 08/23/22 1028     Visit Number 11    Number of Visits 17    Date for OT Re-Evaluation 09/14/22    Authorization Type [employer]    Progress Note Due on Visit 10    OT Start Time 1032    OT Stop Time 1115    OT Time Calculation (min) 43 min    Activity Tolerance Patient tolerated treatment well    Behavior During Therapy WFL for tasks assessed/performed              Past Medical History:  Diagnosis Date   Allergy to alpha-gal    2015; has been able to re-introduce red meat for the past 2 1/2 years without reaction (as of 01/21/19)   Arthritis    Chest pain    a. 03/2017: echo showing EF of 60-65%, no regional WMA or significant valve abnormalities. b. 03/2016: NST with no evidence of ischemia.    Complication of anesthesia    "anaphylactic" reaction to Xylocaine > 10 years ago; reported later recieved marcaine without issue   Difficult intubation 02/19/2018   No difficulty 01/26/19 with Mac and 3 or 11/10/19 with Glidescope   Digestive problems    on Prednisone prn for this issue- diarrhea   Dyspnea    pulmonary fibrosis   Dysrhythmia    irregular due to Myocarditis- takes Atenolol, Norvasc   GERD (gastroesophageal reflux disease)    H/O viral myocarditis    25 years ago   Head injury, closed, with concussion    Breif LOC   Hyperlipidemia    Hypertension    Mild mitral valve prolapse    per Dr Landry DykeKelly's notes   PAF (paroxysmal atrial fibrillation)    a. initially occuring in 02/2016. b. recurrent in 07/2016. Placed on Eliquis   Polymyalgia rheumatica    PONV (postoperative nausea and vomiting)    Pre-diabetes    elevated due to high dose of prednsione for back no longer elevated   Pulmonary  fibrosis    Sleep apnea    no cpap mild   Past Surgical History:  Procedure Laterality Date   ANTERIOR CERVICAL DECOMP/DISCECTOMY FUSION N/A 11/10/2019   Procedure: Cervical Three-Four Anterior cervical decompression/discectomy/fusion;  Surgeon: Barnett AbuElsner, Henry, MD;  [facility];  Service: Neurosurgery;  Laterality: N/A;  Cervical Three-Four Anterior cervical decompression/discectomy/fusion   apendectomy  1965   APPENDECTOMY     APPLICATION OF ROBOTIC ASSISTANCE FOR SPINAL PROCEDURE N/A 12/22/2020   Procedure: APPLICATION OF ROBOTIC ASSISTANCE FOR SPINAL PROCEDURE;  Surgeon: Barnett AbuElsner, Henry, MD;  Location: [facility] OR;  Service: Neurosurgery;  Laterality: N/A;   BACK SURGERY     bone spur Bilateral 1999   feet   CARDIAC CATHETERIZATION  2001   CHOLECYSTECTOMY  2004   COLON RESECTION N/A 06/15/2018   Procedure: SIGMOID COLON RESECTION;  Surgeon: Griselda Mineroth, Paul III, MD;  Location: [facility] ORS;  Service: General;  Laterality: N/A;   COLONOSCOPY     COLOSTOMY N/A 06/15/2018   Procedure: COLOSTOMY;  Surgeon: Griselda Mineroth, Paul III, MD;  Location: [facility];  Service: General;  Laterality: N/A;   COLOSTOMY TAKEDOWN N/A 01/26/2019   Procedure: LAPAROSCOPIC ASSISTED COLOSTOMY TAKEDOWN;  Surgeon: Griselda Mineroth, Paul III, MD;  Location: [facility] OR;  Service: General;  Laterality: N/A;   EYE SURGERY Bilateral    cataract removal   LAPAROSCOPIC LYSIS OF ADHESIONS N/A 01/26/2019   Procedure: LAPAROSCOPIC LYSIS OF ADHESIONS;  Surgeon: Griselda Miner, MD;  Location: [facility] OR;  Service: General;  Laterality: N/A;   LAPAROTOMY N/A 06/15/2018   Procedure: EXPLORATORY LAPAROTOMY;  Surgeon: Griselda Miner, MD;  Location: [facility];  Service: General;  Laterality: N/A;   LUMBAR LAMINECTOMY/ DECOMPRESSION WITH MET-RX Right 05/05/2013   Procedure: Right Lumbar three-four Extraforaminal Microdiskectomy with Metrex;  Surgeon: Barnett Abu, MD;  Location: [facility];  Service: Neurosurgery;  Laterality: Right;  Right Lumbar three-four Extraforaminal  Microdiskectomy with Metrex   LUNG BIOPSY Right 02/19/2018   Procedure: LUNG BIOPSY;  Surgeon: Loreli Slot, MD;  Location: [facility] OR;  Service: Thoracic;  Laterality: Right;   POSTERIOR LUMBAR FUSION 4 LEVEL N/A 12/22/2020   Procedure: Lumbar one-two Posterior lumbar interbody fusion with stabilization from Thoracic ten to Lumbar one with robotic screw placement;  Surgeon: Barnett Abu, MD;  Location: [facility] OR;  Service: Neurosurgery;  Laterality: N/A;  RM 20   REVERSE SHOULDER ARTHROPLASTY Right 07/11/2022   Procedure: REVERSE SHOULDER ARTHROPLASTY;  Surgeon: Bjorn Pippin, MD;  Location: [facility];  Service: Orthopedics;  Laterality: Right;   SHOULDER OPEN ROTATOR CUFF REPAIR Bilateral 2001   TONSILLECTOMY     VIDEO ASSISTED THORACOSCOPY Right 02/19/2018   Procedure: VIDEO ASSISTED THORACOSCOPY;  Surgeon: Loreli Slot, MD;  Location: [facility] OR;  Service: Thoracic;  Laterality: Right;   Patient Active Problem List   Diagnosis Date Noted   Spinal stenosis of lumbar region with neurogenic claudication 12/22/2020   Pre-operative respiratory examination 12/13/2020   Cervical spondylosis with myelopathy 11/10/2019   Shortness of breath 08/04/2019   Colostomy in place 01/26/2019   Prolonged Q-T interval on ECG 06/25/2018   Immunosuppression due to drug therapy for ILD 06/16/2018   Sigmoid colon perforation s/p Hartmann colectomy/colostomy 06/15/2018 06/15/2018   Perforated diverticulum of large intestine 06/15/2018   OSA (obstructive sleep apnea) 12/13/2016   Snoring 10/26/2016   PAF (paroxysmal atrial fibrillation) 08/14/2016   ILD (interstitial lung disease) 05/01/2016   Lumbar stenosis 02/01/2015   Giardia 04/14/2014   H/O viral cardiomyopathy 04/14/2014   Herniated nucleus pulposus, L3-4 right 05/05/2013   Other hypertrophic cardiomyopathy (HCC) 04/19/2013   Hyperlipidemia 04/19/2013   Essential hypertension 04/19/2013   Abnormal LFTs 09/16/2012   Nausea 07/10/2011   Chronic  diarrhea 07/10/2011   Burping 07/10/2011   Osteoporosis 07/10/2011   Arthritis 07/10/2011    ONSET DATE: 07/11/22  REFERRING DIAG: R Reverse Total Shoulder Arthoscopy  THERAPY DIAG:  Acute pain of right shoulder  Shoulder stiffness, right  Other symptoms and signs involving the musculoskeletal system  Rationale for Evaluation and Treatment: Rehabilitation  SUBJECTIVE:   SUBJECTIVE STATEMENT: S: "I feel like I'm really getting my arm moving more." Pt accompanied by: self and significant other  PERTINENT HISTORY: Patient reports medical history significant for pulmonary fibrosis, interstitial lung disease.   PRECAUTIONS: Shoulder  WEIGHT BEARING RESTRICTIONS: Yes No Weight Bearing  PAIN:  Are you having pain? Yes: NPRS scale: 2/10 Pain description: sore and tight Aggravating factors: anterior shoulder girdle Relieving factors: rest  FALLS: Has patient fallen in last 6 months? Yes. Number of falls 1 - tripping over a stool  PATIENT GOALS: "To get ROM back in the arm"  NEXT MD VISIT: unsure  OBJECTIVE:   HAND DOMINANCE: Right  ADLs: Overall ADLs: Pt  requiring mod-max assist for all ADL's and IADL's at this time. He is unable to lift items or sustain holds on items due to weight.   FUNCTIONAL OUTCOME MEASURES: FOTO: 34.11 08/21/22: 62.18  UPPER EXTREMITY ROM:     Passive ROM Supine Right eval  Shoulder flexion 123  Shoulder abduction 115  Shoulder internal rotation 90  Shoulder external rotation -9  (Blank rows = not tested)  Active ROM seated Right eval Right 08/21/22  Shoulder flexion  115  Shoulder abduction  110  Shoulder internal rotation  90  Shoulder external rotation  41  (Blank rows = not tested)  UPPER EXTREMITY MMT:     MMT Right eval Right 08/21/22  Shoulder flexion  4-/5  Shoulder abduction  4-/5  Shoulder adduction  4/5  Shoulder extension  4/5  Shoulder internal rotation  3+/5  Shoulder external rotation  4-/5  (Blank rows = not  tested)   EDEMA: Mild to moderate swelling noted around surgical area  OBSERVATIONS: Severe Fascial restrictions noted in subscapularis, biceps, trapezius, and axillary region.   TODAY'S TREATMENT:                                                                                                                              DATE:   08/23/22 -Manual therapy: myofascial release and trigger point applied to biceps, trapezius, scapula, and axillary region in order to address pain and fascial restrictions to improve ROM. -AA/ROM: seated, flexion, abduction, protraction, horizontal abduction, er/IR, x10 -A/ROM: seated, flexion, abduction, protraction, horizontal abduction, er/IR, x10 -Proximal Shoulder Exercises: green weighted ball, ABC's on the wall, arms on fire on the wall x60" -Scapular strengthening: green band, extension, retraction, rows, x12  08/21/22 -Manual therapy: myofascial release and trigger point applied to biceps, trapezius, scapula, and axillary region in order to address pain and fascial restrictions to improve ROM. -AA/ROM: seated, flexion, abduction, protraction, horizontal abduction, er/IR, x10 -A/ROM: seated, flexion, abduction, protraction, horizontal abduction, er/IR, x10 -Ball roll on the wall: flexion x10 -Scapular strengthening: red band, extension, retraction, rows, x10 -measurements for progress note.   08/16/22 -Manual therapy: myofascial release and trigger point applied to biceps, trapezius, scapula, and axillary region in order to address pain and fascial restrictions to improve ROM. -AA/ROM: supine, flexion, abduction, protraction, horizontal abduction, er/IR, x10 -A/ROM: supine, flexion, abduction, protraction, horizontal abduction, er/IR, x10 -Wall Slides: flexion, abduction, x10 -Proximal shoulder exercise: flexion at 90 degrees on door, x60"      PATIENT EDUCATION: Education details: Reviewing HEP Person educated: Patient Education method:  Explanation, Demonstration, and Handouts Education comprehension: verbalized understanding and returned demonstration  HOME EXERCISE PROGRAM: 3/1: Pendulums 3/5: Table Slides 3/12: Isometrics 3/28: AA/ROM 4/3: Wall Slides and A/ROM 4/10: Scapular Strengthening (red band)  GOALS: Goals reviewed with patient? Yes  SHORT TERM GOALS: Target date: 08/17/22  Pt will be provided with and educated on HEP to improve mobility in RUE required for use during ADL completion.  Goal  status: IN PROGRESS  2.   Pt will increase RUE P/ROM by 40 degrees or greater to improve ability to use LUE during dressing tasks with minimal compensatory techniques.  Goal status: IN PROGRESS  3.  Pt will increase LUE strength to 3+/5 to improve ability to reach for items at waist to chest height during bathing and grooming tasks.  Goal status: IN PROGRESS   LONG TERM GOALS: Target date: 09/14/22  Pt will decrease pain in RUE to 3/10 or less to improve ability to sleep for 2+ consecutive hours without waking due to pain.  Goal status: IN PROGRESS  2.  Pt will decrease RUE fascial restrictions to min amounts or less to improve mobility required for functional reaching tasks.  Goal status: IN PROGRESS  3.  Pt will increase RUE A/ROM to at least 145 for flexion/abduction, and 50 degrees er/IR to improve ability to use RUE when reaching overhead or behind back during dressing and bathing tasks.  Goal status: IN PROGRESS  4.  Pt will increase LUE strength to 4+/5 or greater to improve ability to use LUE when lifting or carrying items during meal preparation/housework/yardwork tasks. Goal status: IN PROGRESS  5.  Pt will return to highest level of function using LUE as non-dominant during functional task completion.  Goal status: IN PROGRESS   ASSESSMENT:  CLINICAL IMPRESSION: Pt presenting to session with increased ROM, achieving approximately 75% of full ROM with AA/ROM and 65% of full ROM with A/ROM, while also  demonstrating a good movement pattern. OT added proximal shoulder exercises this session for shoulder stability and endurance, which he complete well. Verbal and tactile cuing provided throughout for positioning and technique.   PERFORMANCE DEFICITS: in functional skills including ADLs, IADLs, edema, ROM, strength, pain, fascial restrictions, Gross motor control, body mechanics, and UE functional use.   PLAN:  OT FREQUENCY: 2x/week  OT DURATION: 8 weeks  PLANNED INTERVENTIONS: self care/ADL training, therapeutic exercise, therapeutic activity, manual therapy, scar mobilization, passive range of motion, functional mobility training, electrical stimulation, ultrasound, moist heat, cryotherapy, patient/family education, energy conservation, coping strategies training, and DME and/or AE instructions  CONSULTED AND AGREED WITH PLAN OF CARE: Patient  PLAN FOR NEXT SESSION: Manual Therapy, P/ROM, Thumb tacs, Table slides, low level wall washes, Wall Slides, AA/ROM, Start low level scap strengthening, continue phase 2 of protocol.     Trish Mage, OTR/L [phone] 08/23/2022, 10:36 AM

## 2022-08-28 ENCOUNTER — Encounter (HOSPITAL_COMMUNITY): Payer: Self-pay | Admitting: Occupational Therapy

## 2022-08-28 ENCOUNTER — Ambulatory Visit (HOSPITAL_COMMUNITY): Payer: PPO | Admitting: Occupational Therapy

## 2022-08-28 DIAGNOSIS — M25511 Pain in right shoulder: Secondary | ICD-10-CM

## 2022-08-28 DIAGNOSIS — R29898 Other symptoms and signs involving the musculoskeletal system: Secondary | ICD-10-CM

## 2022-08-28 DIAGNOSIS — M25611 Stiffness of right shoulder, not elsewhere classified: Secondary | ICD-10-CM

## 2022-08-28 NOTE — Therapy (Signed)
OUTPATIENT OCCUPATIONAL THERAPY ORTHO TREATMENT NOTE  Patient Name: [name] MRN: [id number] DOB:12/16/1945, 77 y.o., male Today's Date: 08/28/2022  PCP: Benita Stabile, MD REFERRING PROVIDER: Ramond Marrow, MD   END OF SESSION:   08/28/22 1124  OT Visits / Re-Eval  Visit Number 12  Number of Visits 17  Date for OT Re-Evaluation 09/14/22  Authorization  Authorization Type [employer]  Progress Note Due on Visit 10  OT Time Calculation  OT Start Time 1120  OT Stop Time 1148  OT Time Calculation (min) 28 min  End of Session  Activity Tolerance Patient tolerated treatment well  Behavior During Therapy [facility] for tasks assessed/performed   Pt left early not feeling well.    Past Medical History:  Diagnosis Date   Allergy to alpha-gal    2015; has been able to re-introduce red meat for the past 2 1/2 years without reaction (as of 01/21/19)   Arthritis    Chest pain    a. 03/2017: echo showing EF of 60-65%, no regional WMA or significant valve abnormalities. b. 03/2016: NST with no evidence of ischemia.    Complication of anesthesia    "anaphylactic" reaction to Xylocaine > 10 years ago; reported later recieved marcaine without issue   Difficult intubation 02/19/2018   No difficulty 01/26/19 with Mac and 3 or 11/10/19 with Glidescope   Digestive problems    on Prednisone prn for this issue- diarrhea   Dyspnea    pulmonary fibrosis   Dysrhythmia    irregular due to Myocarditis- takes Atenolol, Norvasc   GERD (gastroesophageal reflux disease)    H/O viral myocarditis    25 years ago   Head injury, closed, with concussion    Breif LOC   Hyperlipidemia    Hypertension    Mild mitral valve prolapse    per Dr Landry Dyke notes   PAF (paroxysmal atrial fibrillation)    a. initially occuring in 02/2016. b. recurrent in 07/2016. Placed on Eliquis   Polymyalgia rheumatica    PONV (postoperative nausea and vomiting)    Pre-diabetes    elevated due to high dose of  prednsione for back no longer elevated   Pulmonary fibrosis    Sleep apnea    no cpap mild   Past Surgical History:  Procedure Laterality Date   ANTERIOR CERVICAL DECOMP/DISCECTOMY FUSION N/A 11/10/2019   Procedure: Cervical Three-Four Anterior cervical decompression/discectomy/fusion;  Surgeon: Barnett Abu, MD;  Location: [facility] OR;  Service: Neurosurgery;  Laterality: N/A;  Cervical Three-Four Anterior cervical decompression/discectomy/fusion   apendectomy  1965   APPENDECTOMY     APPLICATION OF ROBOTIC ASSISTANCE FOR SPINAL PROCEDURE N/A 12/22/2020   Procedure: APPLICATION OF ROBOTIC ASSISTANCE FOR SPINAL PROCEDURE;  Surgeon: Barnett Abu, MD;  Location: [facility] OR;  Service: Neurosurgery;  Laterality: N/A;   BACK SURGERY     bone spur Bilateral 1999   feet   CARDIAC CATHETERIZATION  2001   CHOLECYSTECTOMY  2004   COLON RESECTION N/A 06/15/2018   Procedure: SIGMOID COLON RESECTION;  Surgeon: Griselda Miner, MD;  Location: [facility] ORS;  Service: General;  Laterality: N/A;   COLONOSCOPY     COLOSTOMY N/A 06/15/2018   Procedure: COLOSTOMY;  Surgeon: Griselda Miner, MD;  Location: [facility] ORS;  Service: General;  Laterality: N/A;   COLOSTOMY TAKEDOWN N/A 01/26/2019   Procedure: LAPAROSCOPIC ASSISTED COLOSTOMY TAKEDOWN;  Surgeon: Griselda Miner, MD;  Location: [facility] OR;  Service: General;  Laterality: N/A;   EYE SURGERY Bilateral  cataract removal   LAPAROSCOPIC LYSIS OF ADHESIONS N/A 01/26/2019   Procedure: LAPAROSCOPIC LYSIS OF ADHESIONS;  Surgeon: Griselda Miner, MD;  Location: [facility] OR;  Service: General;  Laterality: N/A;   LAPAROTOMY N/A 06/15/2018   Procedure: EXPLORATORY LAPAROTOMY;  Surgeon: Griselda Miner, MD;  Location: [facility] ORS;  Service: General;  Laterality: N/A;   LUMBAR LAMINECTOMY/ DECOMPRESSION WITH MET-RX Right 05/05/2013   Procedure: Right Lumbar three-four Extraforaminal Microdiskectomy with Metrex;  Surgeon: Barnett Abu, MD;  Location: [facility] OR[facility];  Service: Neurosurgery;  Laterality: Right;   Right Lumbar three-four Extraforaminal Microdiskectomy with Metrex   LUNG BIOPSY Right 02/19/2018   Procedure: LUNG BIOPSY;  Surgeon: Loreli Slot, MD;  Location: [facility] OR;  Service: Thoracic;  Laterality: Right;   POSTERIOR LUMBAR FUSION 4 LEVEL N/A 12/22/2020   Procedure: Lumbar one-two Posterior lumbar interbody fusion with stabilization from Thoracic ten to Lumbar one with robotic screw placement;  Surgeon: Barnett Abu, MD;  Location: [facility] OR;  Service: Neurosurgery;  Laterality: N/A;  RM 20   REVERSE SHOULDER ARTHROPLASTY Right 07/11/2022   Procedure: REVERSE SHOULDER ARTHROPLASTY;  Surgeon: Bjorn Pippin, MD;  Location: [facility] ORS;  Service: Orthopedics;  Laterality: Right;   SHOULDER OPEN ROTATOR CUFF REPAIR Bilateral 2001   TONSILLECTOMY     VIDEO ASSISTED THORACOSCOPY Right 02/19/2018   Procedure: VIDEO ASSISTED THORACOSCOPY;  Surgeon: Loreli Slot, MD;  Location: [facility] OR;  Service: Thoracic;  Laterality: Right;   Patient Active Problem List   Diagnosis Date Noted   Spinal stenosis of lumbar region with neurogenic claudication 12/22/2020   Pre-operative respiratory examination 12/13/2020   Cervical spondylosis with myelopathy 11/10/2019   Shortness of breath 08/04/2019   Colostomy in place 01/26/2019   Prolonged Q-T interval on ECG 06/25/2018   Immunosuppression due to drug therapy for ILD 06/16/2018   Sigmoid colon perforation s/p Hartmann colectomy/colostomy 06/15/2018 06/15/2018   Perforated diverticulum of large intestine 06/15/2018   OSA (obstructive sleep apnea) 12/13/2016   Snoring 10/26/2016   PAF (paroxysmal atrial fibrillation) 08/14/2016   ILD (interstitial lung disease) 05/01/2016   Lumbar stenosis 02/01/2015   Giardia 04/14/2014   H/O viral cardiomyopathy 04/14/2014   Herniated nucleus pulposus, L3-4 right 05/05/2013   Other hypertrophic cardiomyopathy (HCC) 04/19/2013   Hyperlipidemia 04/19/2013   Essential hypertension 04/19/2013   Abnormal LFTs 09/16/2012    Nausea 07/10/2011   Chronic diarrhea 07/10/2011   Burping 07/10/2011   Osteoporosis 07/10/2011   Arthritis 07/10/2011   Pt left early not feeling well  ONSET DATE: 07/11/22  REFERRING DIAG: R Reverse Total Shoulder Arthoscopy  THERAPY DIAG:  Acute pain of right shoulder  Shoulder stiffness, right  Other symptoms and signs involving the musculoskeletal system  Rationale for Evaluation and Treatment: Rehabilitation  SUBJECTIVE:   SUBJECTIVE STATEMENT: S: "I feel like I'm really getting my arm moving more." Pt accompanied by: self and significant other  PERTINENT HISTORY: Patient reports medical history significant for pulmonary fibrosis, interstitial lung disease.   PRECAUTIONS: Shoulder  WEIGHT BEARING RESTRICTIONS: Yes No Weight Bearing  PAIN:  Are you having pain? Yes: NPRS scale: 2/10 Pain description: sore and tight Aggravating factors: anterior shoulder girdle Relieving factors: rest  FALLS: Has patient fallen in last 6 months? Yes. Number of falls 1 - tripping over a stool  PATIENT GOALS: "To get ROM back in the arm"  NEXT MD VISIT: unsure  OBJECTIVE:   HAND DOMINANCE: Right  ADLs: Overall ADLs: Pt requiring mod-max assist for all ADL's and  IADL's at this time. He is unable to lift items or sustain holds on items due to weight.   FUNCTIONAL OUTCOME MEASURES: FOTO: 34.11 08/21/22: 62.18  UPPER EXTREMITY ROM:     Passive ROM Supine Right eval  Shoulder flexion 123  Shoulder abduction 115  Shoulder internal rotation 90  Shoulder external rotation -9  (Blank rows = not tested)  Active ROM seated Right eval Right 08/21/22  Shoulder flexion  115  Shoulder abduction  110  Shoulder internal rotation  90  Shoulder external rotation  41  (Blank rows = not tested)  UPPER EXTREMITY MMT:     MMT Right eval Right 08/21/22  Shoulder flexion  4-/5  Shoulder abduction  4-/5  Shoulder adduction  4/5  Shoulder extension  4/5  Shoulder internal  rotation  3+/5  Shoulder external rotation  4-/5  (Blank rows = not tested)   EDEMA: Mild to moderate swelling noted around surgical area  OBSERVATIONS: Severe Fascial restrictions noted in subscapularis, biceps, trapezius, and axillary region.   TODAY'S TREATMENT:                                                                                                                              DATE:   08/28/22 -A/ROM: seated, flexion, abduction, protraction, horizontal abduction, er/IR, x12 -Wall Slides: flexion, abduction, x15 -Shoulder Strengthening: 2lb dumbbells, seated, flexion, abduction, protraction, horizontal abduction, er/IR, x10  08/23/22 -Manual therapy: myofascial release and trigger point applied to biceps, trapezius, scapula, and axillary region in order to address pain and fascial restrictions to improve ROM. -AA/ROM: seated, flexion, abduction, protraction, horizontal abduction, er/IR, x10 -A/ROM: seated, flexion, abduction, protraction, horizontal abduction, er/IR, x10 -Proximal Shoulder Exercises: green weighted ball, ABC's on the wall, arms on fire on the wall x60" -Scapular strengthening: green band, extension, retraction, rows, x12  08/21/22 -Manual therapy: myofascial release and trigger point applied to biceps, trapezius, scapula, and axillary region in order to address pain and fascial restrictions to improve ROM. -AA/ROM: seated, flexion, abduction, protraction, horizontal abduction, er/IR, x10 -A/ROM: seated, flexion, abduction, protraction, horizontal abduction, er/IR, x10 -Ball roll on the wall: flexion x10 -Scapular strengthening: red band, extension, retraction, rows, x10 -measurements for progress note.     PATIENT EDUCATION: Education details: Reviewing HEP Person educated: Patient Education method: Explanation, Demonstration, and Handouts Education comprehension: verbalized understanding and returned demonstration  HOME EXERCISE PROGRAM: 3/1:  Pendulums 3/5: Table Slides 3/12: Isometrics 3/28: AA/ROM 4/3: Wall Slides and A/ROM 4/10: Scapular Strengthening (red band)  GOALS: Goals reviewed with patient? Yes  SHORT TERM GOALS: Target date: 08/17/22  Pt will be provided with and educated on HEP to improve mobility in RUE required for use during ADL completion.  Goal status: IN PROGRESS  2.   Pt will increase RUE P/ROM by 40 degrees or greater to improve ability to use LUE during dressing tasks with minimal compensatory techniques.  Goal status: IN PROGRESS  3.  Pt will increase LUE  strength to 3+/5 to improve ability to reach for items at waist to chest height during bathing and grooming tasks.  Goal status: IN PROGRESS   LONG TERM GOALS: Target date: 09/14/22  Pt will decrease pain in RUE to 3/10 or less to improve ability to sleep for 2+ consecutive hours without waking due to pain.  Goal status: IN PROGRESS  2.  Pt will decrease RUE fascial restrictions to min amounts or less to improve mobility required for functional reaching tasks.  Goal status: IN PROGRESS  3.  Pt will increase RUE A/ROM to at least 145 for flexion/abduction, and 50 degrees er/IR to improve ability to use RUE when reaching overhead or behind back during dressing and bathing tasks.  Goal status: IN PROGRESS  4.  Pt will increase LUE strength to 4+/5 or greater to improve ability to use LUE when lifting or carrying items during meal preparation/housework/yardwork tasks. Goal status: IN PROGRESS  5.  Pt will return to highest level of function using LUE as non-dominant during functional task completion.  Goal status: IN PROGRESS   ASSESSMENT:  CLINICAL IMPRESSION: Pt reporting that he is feeling like he is able to mobilize his shoulder more functionally at this time. This session he demonstrated approximately 75-80% of full ROM actively, as well as tolerated being upgraded to 2lb dumbbells well. With shoulder strengthening he fatigues quickly and  requires multiple rest breaks. OT providing verbal cuing for positioning and technique this session, along with reducing speed for a more controled motion. Session ended early due to pt reporting he was not feeling well and needing to leave.   PERFORMANCE DEFICITS: in functional skills including ADLs, IADLs, edema, ROM, strength, pain, fascial restrictions, Gross motor control, body mechanics, and UE functional use.   PLAN:  OT FREQUENCY: 2x/week  OT DURATION: 8 weeks  PLANNED INTERVENTIONS: self care/ADL training, therapeutic exercise, therapeutic activity, manual therapy, scar mobilization, passive range of motion, functional mobility training, electrical stimulation, ultrasound, moist heat, cryotherapy, patient/family education, energy conservation, coping strategies training, and DME and/or AE instructions  CONSULTED AND AGREED WITH PLAN OF CARE: Patient  PLAN FOR NEXT SESSION: Manual Therapy, P/ROM, Thumb tacs, Table slides, low level wall washes, Wall Slides, AA/ROM, Start low level scap strengthening, continue phase 2 of protocol.     Trish Mage, OTR/L [phone] 08/28/2022, 11:30 AM

## 2022-08-29 ENCOUNTER — Encounter (HOSPITAL_COMMUNITY): Payer: Self-pay

## 2022-08-29 ENCOUNTER — Ambulatory Visit (HOSPITAL_COMMUNITY)
Admission: RE | Admit: 2022-08-29 | Discharge: 2022-08-29 | Disposition: A | Discharge status: Home / Self Care | Payer: PPO | Source: Ambulatory Visit | Attending: Internal Medicine | Admitting: Internal Medicine

## 2022-08-29 DIAGNOSIS — R102 Pelvic and perineal pain: Secondary | ICD-10-CM

## 2022-08-29 DIAGNOSIS — K4090 Unilateral inguinal hernia, without obstruction or gangrene, not specified as recurrent: Secondary | ICD-10-CM | POA: Diagnosis not present

## 2022-08-29 DIAGNOSIS — N433 Hydrocele, unspecified: Secondary | ICD-10-CM | POA: Diagnosis not present

## 2022-08-30 ENCOUNTER — Encounter (HOSPITAL_COMMUNITY): Payer: PPO | Admitting: Occupational Therapy

## 2022-09-04 ENCOUNTER — Encounter (HOSPITAL_COMMUNITY): Payer: Self-pay | Admitting: Occupational Therapy

## 2022-09-04 ENCOUNTER — Ambulatory Visit (HOSPITAL_COMMUNITY): Payer: PPO | Admitting: Occupational Therapy

## 2022-09-04 DIAGNOSIS — R29898 Other symptoms and signs involving the musculoskeletal system: Secondary | ICD-10-CM

## 2022-09-04 DIAGNOSIS — M25611 Stiffness of right shoulder, not elsewhere classified: Secondary | ICD-10-CM

## 2022-09-04 DIAGNOSIS — M25511 Pain in right shoulder: Secondary | ICD-10-CM

## 2022-09-04 NOTE — Therapy (Unsigned)
OUTPATIENT OCCUPATIONAL THERAPY ORTHO TREATMENT NOTE  Patient Name: Nikalas Bramel MRN: [id number] DOB:02/03/1946, 77 y.o., male Today's Date: 09/04/2022  PCP: Benita Stabile, MD REFERRING PROVIDER: Ramond Marrow, MD   END OF SESSION:  OT End of Session - 09/04/22 1042     Visit Number 13    Number of Visits 17    Date for OT Re-Evaluation 09/14/22    Authorization Type [employer]    Progress Note Due on Visit 10    OT Start Time 1038    OT Stop Time 1118    OT Time Calculation (min) 40 min    Activity Tolerance Patient tolerated treatment well    Behavior During Therapy WFL for tasks assessed/performed             Past Medical History:  Diagnosis Date   Allergy to alpha-gal    2015; has been able to re-introduce red meat for the past 2 1/2 years without reaction (as of 01/21/19)   Arthritis    Chest pain    a. 03/2017: echo showing EF of 60-65%, no regional WMA or significant valve abnormalities. b. 03/2016: NST with no evidence of ischemia.    Complication of anesthesia    "anaphylactic" reaction to Xylocaine > 10 years ago; reported later recieved marcaine without issue   Difficult intubation 02/19/2018   No difficulty 01/26/19 with Mac and 3 or 11/10/19 with Glidescope   Digestive problems    on Prednisone prn for this issue- diarrhea   Dyspnea    pulmonary fibrosis   Dysrhythmia    irregular due to Myocarditis- takes Atenolol, Norvasc   GERD (gastroesophageal reflux disease)    H/O viral myocarditis    25 years ago   Head injury, closed, with concussion    Breif LOC   Hyperlipidemia    Hypertension    Mild mitral valve prolapse    per Dr Landry Dyke notes   PAF (paroxysmal atrial fibrillation)    a. initially occuring in 02/2016. b. recurrent in 07/2016. Placed on Eliquis   Polymyalgia rheumatica    PONV (postoperative nausea and vomiting)    Pre-diabetes    elevated due to high dose of prednsione for back no longer elevated   Pulmonary  fibrosis    Sleep apnea    no cpap mild   Past Surgical History:  Procedure Laterality Date   ANTERIOR CERVICAL DECOMP/DISCECTOMY FUSION N/A 11/10/2019   Procedure: Cervical Three-Four Anterior cervical decompression/discectomy/fusion;  Surgeon: Barnett Abu, MD;  Location: [facility] OR;  Service: Neurosurgery;  Laterality: N/A;  Cervical Three-Four Anterior cervical decompression/discectomy/fusion   apendectomy  1965   APPENDECTOMY     APPLICATION OF ROBOTIC ASSISTANCE FOR SPINAL PROCEDURE N/A 12/22/2020   Procedure: APPLICATION OF ROBOTIC ASSISTANCE FOR SPINAL PROCEDURE;  Surgeon: Barnett Abu, MD;  Location: [facility] OR;  Service: Neurosurgery;  Laterality: N/A;   BACK SURGERY     bone spur Bilateral 1999   feet   CARDIAC CATHETERIZATION  2001   CHOLECYSTECTOMY  2004   COLON RESECTION N/A 06/15/2018   Procedure: SIGMOID COLON RESECTION;  Surgeon: Griselda Miner, MD;  Location: [facility] ORS;  Service: General;  Laterality: N/A;   COLONOSCOPY     COLOSTOMY N/A 06/15/2018   Procedure: COLOSTOMY;  Surgeon: Griselda Miner, MD;  Location: [facility] ORS;  Service: General;  Laterality: N/A;   COLOSTOMY TAKEDOWN N/A 01/26/2019   Procedure: LAPAROSCOPIC ASSISTED COLOSTOMY TAKEDOWN;  Surgeon: Griselda Miner, MD;  Location: [facility] OR;  Service:  General;  Laterality: N/A;   EYE SURGERY Bilateral    cataract removal   LAPAROSCOPIC LYSIS OF ADHESIONS N/A 01/26/2019   Procedure: LAPAROSCOPIC LYSIS OF ADHESIONS;  Surgeon: Griselda Miner, MD;  Location: [facility] OR;  Service: General;  Laterality: N/A;   LAPAROTOMY N/A 06/15/2018   Procedure: EXPLORATORY LAPAROTOMY;  Surgeon: Griselda Miner, MD;  Location: [facility] ORS;  Service: General;  Laterality: N/A;   LUMBAR LAMINECTOMY/ DECOMPRESSION WITH MET-RX Right 05/05/2013   Procedure: Right Lumbar three-four Extraforaminal Microdiskectomy with Metrex;  Surgeon: Barnett Abu, MD;  Location: [facility];  Service: Neurosurgery;  Laterality: Right;  Right Lumbar three-four Extraforaminal  Microdiskectomy with Metrex   LUNG BIOPSY Right 02/19/2018   Procedure: LUNG BIOPSY;  Surgeon: Loreli Slot, MD;  Location: [facility] OR;  Service: Thoracic;  Laterality: Right;   POSTERIOR LUMBAR FUSION 4 LEVEL N/A 12/22/2020   Procedure: Lumbar one-two Posterior lumbar interbody fusion with stabilization from Thoracic ten to Lumbar one with robotic screw placement;  Surgeon: Barnett Abu, MD;  Location: [facility] OR;  Service: Neurosurgery;  Laterality: N/A;  RM 20   REVERSE SHOULDER ARTHROPLASTY Right 07/11/2022   Procedure: REVERSE SHOULDER ARTHROPLASTY;  Surgeon: Bjorn Pippin, MD;  Location: [facility] ORS;  Service: Orthopedics;  Laterality: Right;   SHOULDER OPEN ROTATOR CUFF REPAIR Bilateral 2001   TONSILLECTOMY     VIDEO ASSISTED THORACOSCOPY Right 02/19/2018   Procedure: VIDEO ASSISTED THORACOSCOPY;  Surgeon: Loreli Slot, MD;  Location: [facility] OR;  Service: Thoracic;  Laterality: Right;   Patient Active Problem List   Diagnosis Date Noted   Spinal stenosis of lumbar region with neurogenic claudication 12/22/2020   Pre-operative respiratory examination 12/13/2020   Cervical spondylosis with myelopathy 11/10/2019   Shortness of breath 08/04/2019   Colostomy in place 01/26/2019   Prolonged Q-T interval on ECG 06/25/2018   Immunosuppression due to drug therapy for ILD 06/16/2018   Sigmoid colon perforation s/p Hartmann colectomy/colostomy 06/15/2018 06/15/2018   Perforated diverticulum of large intestine 06/15/2018   OSA (obstructive sleep apnea) 12/13/2016   Snoring 10/26/2016   PAF (paroxysmal atrial fibrillation) 08/14/2016   ILD (interstitial lung disease) 05/01/2016   Lumbar stenosis 02/01/2015   Giardia 04/14/2014   H/O viral cardiomyopathy 04/14/2014   Herniated nucleus pulposus, L3-4 right 05/05/2013   Other hypertrophic cardiomyopathy (HCC) 04/19/2013   Hyperlipidemia 04/19/2013   Essential hypertension 04/19/2013   Abnormal LFTs 09/16/2012   Nausea 07/10/2011   Chronic  diarrhea 07/10/2011   Burping 07/10/2011   Osteoporosis 07/10/2011   Arthritis 07/10/2011   Pt left early not feeling well  ONSET DATE: 07/11/22  REFERRING DIAG: R Reverse Total Shoulder Arthoscopy  THERAPY DIAG:  Acute pain of right shoulder  Shoulder stiffness, right  Other symptoms and signs involving the musculoskeletal system  Rationale for Evaluation and Treatment: Rehabilitation  SUBJECTIVE:   SUBJECTIVE STATEMENT: S: "I feel like I'm really getting my arm moving more." Pt accompanied by: self and significant other  PERTINENT HISTORY: Patient reports medical history significant for pulmonary fibrosis, interstitial lung disease.   PRECAUTIONS: Shoulder  WEIGHT BEARING RESTRICTIONS: Yes No Weight Bearing  PAIN:  Are you having pain? Yes: NPRS scale: 2/10 Pain description: sore and tight Aggravating factors: anterior shoulder girdle Relieving factors: rest  FALLS: Has patient fallen in last 6 months? Yes. Number of falls 1 - tripping over a stool  PATIENT GOALS: "To get ROM back in the arm"  NEXT MD VISIT: unsure  OBJECTIVE:   HAND DOMINANCE: Right  ADLs: Overall ADLs: Pt requiring mod-max assist for all ADL's and IADL's at this time. He is unable to lift items or sustain holds on items due to weight.   FUNCTIONAL OUTCOME MEASURES: FOTO: 34.11 08/21/22: 62.18  UPPER EXTREMITY ROM:     Passive ROM Supine Right eval  Shoulder flexion 123  Shoulder abduction 115  Shoulder internal rotation 90  Shoulder external rotation -9  (Blank rows = not tested)  Active ROM seated Right eval Right 08/21/22  Shoulder flexion  115  Shoulder abduction  110  Shoulder internal rotation  90  Shoulder external rotation  41  (Blank rows = not tested)  UPPER EXTREMITY MMT:     MMT Right eval Right 08/21/22  Shoulder flexion  4-/5  Shoulder abduction  4-/5  Shoulder adduction  4/5  Shoulder extension  4/5  Shoulder internal rotation  3+/5  Shoulder external  rotation  4-/5  (Blank rows = not tested)   EDEMA: Mild to moderate swelling noted around surgical area  OBSERVATIONS: Severe Fascial restrictions noted in subscapularis, biceps, trapezius, and axillary region.   TODAY'S TREATMENT:                                                                                                                              DATE:   09/04/22 -Manual therapy: myofascial release and trigger point applied to biceps, trapezius, scapula, and axillary region in order to address pain and fascial restrictions to improve ROM. -A/ROM: seated, flexion, abduction, protraction, horizontal abduction, er/IR, x12 -Loop Band Exercises: green band, wall taps, V ups,   08/28/22 -A/ROM: seated, flexion, abduction, protraction, horizontal abduction, er/IR, x12 -Wall Slides: flexion, abduction, x15 -Shoulder Strengthening: 2lb dumbbells, seated, flexion, abduction, protraction, horizontal abduction, er/IR, x10  08/23/22 -Manual therapy: myofascial release and trigger point applied to biceps, trapezius, scapula, and axillary region in order to address pain and fascial restrictions to improve ROM. -AA/ROM: seated, flexion, abduction, protraction, horizontal abduction, er/IR, x10 -A/ROM: seated, flexion, abduction, protraction, horizontal abduction, er/IR, x10 -Proximal Shoulder Exercises: green weighted ball, ABC's on the wall, arms on fire on the wall x60" -Scapular strengthening: green band, extension, retraction, rows, x12  08/21/22 -Manual therapy: myofascial release and trigger point applied to biceps, trapezius, scapula, and axillary region in order to address pain and fascial restrictions to improve ROM. -AA/ROM: seated, flexion, abduction, protraction, horizontal abduction, er/IR, x10 -A/ROM: seated, flexion, abduction, protraction, horizontal abduction, er/IR, x10 -Ball roll on the wall: flexion x10 -Scapular strengthening: red band, extension, retraction, rows,  x10 -measurements for progress note.     PATIENT EDUCATION: Education details: Reviewing HEP Person educated: Patient Education method: Explanation, Demonstration, and Handouts Education comprehension: verbalized understanding and returned demonstration  HOME EXERCISE PROGRAM: 3/1: Pendulums 3/5: Table Slides 3/12: Isometrics 3/28: AA/ROM 4/3: Wall Slides and A/ROM 4/10: Scapular Strengthening (red band)  GOALS: Goals reviewed with patient? Yes  SHORT TERM GOALS: Target date: 08/17/22  Pt will be provided with and  educated on HEP to improve mobility in RUE required for use during ADL completion.  Goal status: IN PROGRESS  2.   Pt will increase RUE P/ROM by 40 degrees or greater to improve ability to use LUE during dressing tasks with minimal compensatory techniques.  Goal status: IN PROGRESS  3.  Pt will increase LUE strength to 3+/5 to improve ability to reach for items at waist to chest height during bathing and grooming tasks.  Goal status: IN PROGRESS   LONG TERM GOALS: Target date: 09/14/22  Pt will decrease pain in RUE to 3/10 or less to improve ability to sleep for 2+ consecutive hours without waking due to pain.  Goal status: IN PROGRESS  2.  Pt will decrease RUE fascial restrictions to min amounts or less to improve mobility required for functional reaching tasks.  Goal status: IN PROGRESS  3.  Pt will increase RUE A/ROM to at least 145 for flexion/abduction, and 50 degrees er/IR to improve ability to use RUE when reaching overhead or behind back during dressing and bathing tasks.  Goal status: IN PROGRESS  4.  Pt will increase LUE strength to 4+/5 or greater to improve ability to use LUE when lifting or carrying items during meal preparation/housework/yardwork tasks. Goal status: IN PROGRESS  5.  Pt will return to highest level of function using LUE as non-dominant during functional task completion.  Goal status: IN PROGRESS   ASSESSMENT:  CLINICAL  IMPRESSION: Pt reporting that he is feeling like he is able to mobilize his shoulder more functionally at this time. This session he demonstrated approximately 75-80% of full ROM actively, as well as tolerated being upgraded to 2lb dumbbells well. With shoulder strengthening he fatigues quickly and requires multiple rest breaks. OT providing verbal cuing for positioning and technique this session, along with reducing speed for a more controled motion. Session ended early due to pt reporting he was not feeling well and needing to leave.   PERFORMANCE DEFICITS: in functional skills including ADLs, IADLs, edema, ROM, strength, pain, fascial restrictions, Gross motor control, body mechanics, and UE functional use.   PLAN:  OT FREQUENCY: 2x/week  OT DURATION: 8 weeks  PLANNED INTERVENTIONS: self care/ADL training, therapeutic exercise, therapeutic activity, manual therapy, scar mobilization, passive range of motion, functional mobility training, electrical stimulation, ultrasound, moist heat, cryotherapy, patient/family education, energy conservation, coping strategies training, and DME and/or AE instructions  CONSULTED AND AGREED WITH PLAN OF CARE: Patient  PLAN FOR NEXT SESSION: Manual Therapy, P/ROM, Thumb tacs, Table slides, low level wall washes, Wall Slides, AA/ROM, Start low level scap strengthening, continue phase 2 of protocol.     Trish Mage, OTR/L [phone] 09/04/2022, 10:43 AM

## 2022-09-04 NOTE — Patient Instructions (Signed)
Complete 10-15 repetitions each. Complete 2-3 time a day.  Wall taps with theraband  With a looped elastic band around forearms/wrists and arms at a 90 angle pressed against the wall. Keeping elbows on the wall, tap right arm out to the right. Hold for 1 second. Return right arm back to neutral. Repeat with left arm.    Wall V slides with Theraband  Place a band loop around hands/forearms and face a wall. Extend both arms diagonally into a V shape on the wall. Hold this stretch for specified amount of time. Lower arms slowly back into neutral position. Repeat.       scap clocks  Tie a loop with a theraband and place around your wrists.  Stand with a wall in front of you.  Picture a clock in front of you.  Place both palms on the wall, arms straight.  While keeping the left/right hand planted, use the right/left hand to pull away and tap each number (1, 3, 5- right OR 11,9,7 left), coming back to center each time.    

## 2022-09-06 ENCOUNTER — Encounter (HOSPITAL_COMMUNITY): Payer: Self-pay | Admitting: Occupational Therapy

## 2022-09-06 ENCOUNTER — Ambulatory Visit (HOSPITAL_COMMUNITY): Payer: PPO | Admitting: Occupational Therapy

## 2022-09-06 DIAGNOSIS — M25511 Pain in right shoulder: Secondary | ICD-10-CM

## 2022-09-06 DIAGNOSIS — R29898 Other symptoms and signs involving the musculoskeletal system: Secondary | ICD-10-CM

## 2022-09-06 DIAGNOSIS — M25611 Stiffness of right shoulder, not elsewhere classified: Secondary | ICD-10-CM

## 2022-09-06 NOTE — Therapy (Signed)
OUTPATIENT OCCUPATIONAL THERAPY ORTHO TREATMENT NOTE  Patient Name: [name] MRN: [id number] DOB:[birth year], 77 y.o., male Today's Date: 09/06/2022  PCP: Benita Stabile, MD REFERRING PROVIDER: Ramond Marrow, MD   END OF SESSION:  OT End of Session - 09/06/22 1122     Visit Number 14    Number of Visits 17    Date for OT Re-Evaluation 09/14/22    Authorization Type [employer]    Progress Note Due on Visit 20    OT Start Time 1035    OT Stop Time 1115    OT Time Calculation (min) 40 min    Activity Tolerance Patient tolerated treatment well    Behavior During Therapy [facility] for tasks assessed/performed              Past Medical History:  Diagnosis Date   Allergy to alpha-gal    2015; has been able to re-introduce red meat for the past 2 1/2 years without reaction (as of 01/21/19)   Arthritis    Chest pain    a. 03/2017: echo showing EF of 60-65%, no regional WMA or significant valve abnormalities. b. 03/2016: NST with no evidence of ischemia.    Complication of anesthesia    "anaphylactic" reaction to Xylocaine > 10 years ago; reported later recieved marcaine without issue   Difficult intubation 02/19/2018   No difficulty 01/26/19 with Mac and 3 or 11/10/19 with Glidescope   Digestive problems    on Prednisone prn for this issue- diarrhea   Dyspnea    pulmonary fibrosis   Dysrhythmia    irregular due to Myocarditis- takes Atenolol, Norvasc   GERD (gastroesophageal reflux disease)    H/O viral myocarditis    25 years ago   Head injury, closed, with concussion    Breif LOC   Hyperlipidemia    Hypertension    Mild mitral valve prolapse    per Dr Landry Dyke notes   PAF (paroxysmal atrial fibrillation)    a. initially occuring in 02/2016. b. recurrent in 07/2016. Placed on Eliquis   Polymyalgia rheumatica    PONV (postoperative nausea and vomiting)    Pre-diabetes    elevated due to high dose of prednsione for back no longer elevated   Pulmonary  fibrosis    Sleep apnea    no cpap mild   Past Surgical History:  Procedure Laterality Date   ANTERIOR CERVICAL DECOMP/DISCECTOMY FUSION N/A 11/10/2019   Procedure: Cervical Three-Four Anterior cervical decompression/discectomy/fusion;  Surgeon: Barnett Abu, MD;  Location: [facility] OR;  Service: Neurosurgery;  Laterality: N/A;  Cervical Three-Four Anterior cervical decompression/discectomy/fusion   apendectomy  1965   APPENDECTOMY     APPLICATION OF ROBOTIC ASSISTANCE FOR SPINAL PROCEDURE N/A 12/22/2020   Procedure: APPLICATION OF ROBOTIC ASSISTANCE FOR SPINAL PROCEDURE;  Surgeon: Barnett Abu, MD;  Location: [facility] OR;  Service: Neurosurgery;  Laterality: N/A;   BACK SURGERY     bone spur Bilateral 1999   feet   CARDIAC CATHETERIZATION  2001   CHOLECYSTECTOMY  2004   COLON RESECTION N/A 06/15/2018   Procedure: SIGMOID COLON RESECTION;  Surgeon: Griselda Miner, MD;  Location: [facility] ORS;  Service: General;  Laterality: N/A;   COLONOSCOPY     COLOSTOMY N/A 06/15/2018   Procedure: COLOSTOMY;  Surgeon: Griselda Miner, MD;  Location: [facility] ORS;  Service: General;  Laterality: N/A;   COLOSTOMY TAKEDOWN N/A 01/26/2019   Procedure: LAPAROSCOPIC ASSISTED COLOSTOMY TAKEDOWN;  Surgeon: Griselda Miner, MD;  Location: [facility] OR;  Service: General;  Laterality: N/A;   EYE SURGERY Bilateral    cataract removal   LAPAROSCOPIC LYSIS OF ADHESIONS N/A 01/26/2019   Procedure: LAPAROSCOPIC LYSIS OF ADHESIONS;  Surgeon: Griselda Miner, MD;  Location: [facility] OR;  Service: General;  Laterality: N/A;   LAPAROTOMY N/A 06/15/2018   Procedure: EXPLORATORY LAPAROTOMY;  Surgeon: Griselda Miner, MD;  Location: [facility] ORS;  Service: General;  Laterality: N/A;   LUMBAR LAMINECTOMY/ DECOMPRESSION WITH MET-RX Right 05/05/2013   Procedure: Right Lumbar three-four Extraforaminal Microdiskectomy with Metrex;  Surgeon: Barnett Abu, MD;  Location: [facility] NEUR[facility] OR[facility];  Service: Neurosurgery;  Laterality: Right;  Right Lumbar three-four Extraforaminal  Microdiskectomy with Metrex   LUNG BIOPSY Right 02/19/2018   Procedure: LUNG BIOPSY;  Surgeon: Loreli Slot, MD;  Location: [facility] OR;  Service: Thoracic;  Laterality: Right;   POSTERIOR LUMBAR FUSION 4 LEVEL N/A 12/22/2020   Procedure: Lumbar one-two Posterior lumbar interbody fusion with stabilization from Thoracic ten to Lumbar one with robotic screw placement;  Surgeon: Barnett Abu, MD;  Location: [facility] OR;  Service: Neurosurgery;  Laterality: N/A;  RM 20   REVERSE SHOULDER ARTHROPLASTY Right 07/11/2022   Procedure: REVERSE SHOULDER ARTHROPLASTY;  Surgeon: Bjorn Pippin, MD;  Location: [facility] ORS;  Service: Orthopedics;  Laterality: Right;   SHOULDER OPEN ROTATOR CUFF REPAIR Bilateral 2001   TONSILLECTOMY     VIDEO ASSISTED THORACOSCOPY Right 02/19/2018   Procedure: VIDEO ASSISTED THORACOSCOPY;  Surgeon: Loreli Slot, MD;  Location: [facility] OR;  Service: Thoracic;  Laterality: Right;   Patient Active Problem List   Diagnosis Date Noted   Spinal stenosis of lumbar region with neurogenic claudication 12/22/2020   Pre-operative respiratory examination 12/13/2020   Cervical spondylosis with myelopathy 11/10/2019   Shortness of breath 08/04/2019   Colostomy in place 01/26/2019   Prolonged Q-T interval on ECG 06/25/2018   Immunosuppression due to drug therapy for ILD 06/16/2018   Sigmoid colon perforation s/p Hartmann colectomy/colostomy 06/15/2018 06/15/2018   Perforated diverticulum of large intestine 06/15/2018   OSA (obstructive sleep apnea) 12/13/2016   Snoring 10/26/2016   PAF (paroxysmal atrial fibrillation) 08/14/2016   ILD (interstitial lung disease) 05/01/2016   Lumbar stenosis 02/01/2015   Giardia 04/14/2014   H/O viral cardiomyopathy 04/14/2014   Herniated nucleus pulposus, L3-4 right 05/05/2013   Other hypertrophic cardiomyopathy (HCC) 04/19/2013   Hyperlipidemia 04/19/2013   Essential hypertension 04/19/2013   Abnormal LFTs 09/16/2012   Nausea 07/10/2011   Chronic  diarrhea 07/10/2011   Burping 07/10/2011   Osteoporosis 07/10/2011   Arthritis 07/10/2011    ONSET DATE: 07/11/22  REFERRING DIAG: R Reverse Total Shoulder Arthoscopy  THERAPY DIAG:  Acute pain of right shoulder  Other symptoms and signs involving the musculoskeletal system  Shoulder stiffness, right  Rationale for Evaluation and Treatment: Rehabilitation  SUBJECTIVE:   SUBJECTIVE STATEMENT: S: "I'm doing good I think." Pt accompanied by: self and significant other  PERTINENT HISTORY: Patient reports medical history significant for pulmonary fibrosis, interstitial lung disease.   PRECAUTIONS: Shoulder  WEIGHT BEARING RESTRICTIONS: Yes No Weight Bearing  PAIN:  Are you having pain? Yes: NPRS scale: 2/10 Pain description: sore and tight Aggravating factors: anterior shoulder girdle Relieving factors: rest  FALLS: Has patient fallen in last 6 months? Yes. Number of falls 1 - tripping over a stool  PATIENT GOALS: "To get ROM back in the arm"  NEXT MD VISIT: unsure  OBJECTIVE:   HAND DOMINANCE: Right  ADLs: Overall ADLs: Pt requiring mod-max assist for all  ADL's and IADL's at this time. He is unable to lift items or sustain holds on items due to weight.   FUNCTIONAL OUTCOME MEASURES: FOTO: 34.11 08/21/22: 62.18  UPPER EXTREMITY ROM:     Passive ROM Supine Right eval  Shoulder flexion 123  Shoulder abduction 115  Shoulder internal rotation 90  Shoulder external rotation -9  (Blank rows = not tested)  Active ROM seated Right eval Right 08/21/22  Shoulder flexion  115  Shoulder abduction  110  Shoulder internal rotation  90  Shoulder external rotation  41  (Blank rows = not tested)  UPPER EXTREMITY MMT:     MMT Right eval Right 08/21/22  Shoulder flexion  4-/5  Shoulder abduction  4-/5  Shoulder adduction  4/5  Shoulder extension  4/5  Shoulder internal rotation  3+/5  Shoulder external rotation  4-/5  (Blank rows = not tested)   EDEMA: Mild  to moderate swelling noted around surgical area  OBSERVATIONS: Severe Fascial restrictions noted in subscapularis, biceps, trapezius, and axillary region.   TODAY'S TREATMENT:                                                                                                                              DATE:  09/06/22 -Manual therapy: myofascial release and trigger point applied to biceps, trapezius, scapula, and axillary region in order to address pain and fascial restrictions to improve ROM. -P/ROM: supine-protraction, flexion, abduction, horizontal abduction, er/IR, 10 reps -A/ROM: supine-protraction, flexion, abduction, horizontal abduction, er/IR, 10 reps -Proximal shoulder strengthening: supine-paddles, criss cross, circles each direction, 20 reps each -X to V arms: 10 reps -Debroah Loop press: 7 reps -Overhead lacing: seated, lacing from top down then reversing -Functional Reaching: placing 10 cones on top shelf of overhead cabinet in flexion, removing in abduction -UBE: level 1, 2' forward 2' reverse, pace: 5.0  09/04/22 -Manual therapy: myofascial release and trigger point applied to biceps, trapezius, scapula, and axillary region in order to address pain and fascial restrictions to improve ROM. -A/ROM: seated, flexion, abduction, protraction, horizontal abduction, er/IR, x12 -Loop Band Exercises: green band, wall taps, V ups, Wall Clocks, x10 -Scapular strengthening: green band, extension, retraction, rows, x12 -Wall Slides: flexion, abduction, x15  08/28/22 -A/ROM: seated, flexion, abduction, protraction, horizontal abduction, er/IR, x12 -Wall Slides: flexion, abduction, x15 -Shoulder Strengthening: 2lb dumbbells, seated, flexion, abduction, protraction, horizontal abduction, er/IR, x10   PATIENT EDUCATION: Education details: continue HEP Person educated: Patient Education method: Programmer, multimedia, Demonstration, and Handouts Education comprehension: verbalized understanding and returned  demonstration  HOME EXERCISE PROGRAM: 3/1: Pendulums 3/5: Table Slides 3/12: Isometrics 3/28: AA/ROM 4/3: Wall Slides and A/ROM 4/10: Scapular Strengthening (red band) 4/23: Loop Band Exercises  GOALS: Goals reviewed with patient? Yes  SHORT TERM GOALS: Target date: 08/17/22  Pt will be provided with and educated on HEP to improve mobility in RUE required for use during ADL completion.  Goal status: IN PROGRESS  2.   Pt will increase RUE  P/ROM by 40 degrees or greater to improve ability to use LUE during dressing tasks with minimal compensatory techniques.  Goal status: IN PROGRESS  3.  Pt will increase RUE strength to 3+/5 to improve ability to reach for items at waist to chest height during bathing and grooming tasks.  Goal status: IN PROGRESS   LONG TERM GOALS: Target date: 09/14/22  Pt will decrease pain in RUE to 3/10 or less to improve ability to sleep for 2+ consecutive hours without waking due to pain.  Goal status: IN PROGRESS  2.  Pt will decrease RUE fascial restrictions to min amounts or less to improve mobility required for functional reaching tasks.  Goal status: IN PROGRESS  3.  Pt will increase RUE A/ROM to at least 145 for flexion/abduction, and 50 degrees er/IR to improve ability to use RUE when reaching overhead or behind back during dressing and bathing tasks.  Goal status: IN PROGRESS  4.  Pt will increase RUE strength to 4+/5 or greater to improve ability to use LUE when lifting or carrying items during meal preparation/housework/yardwork tasks. Goal status: IN PROGRESS  5.  Pt will return to highest level of function using RUE as non-dominant during functional task completion.  Goal status: IN PROGRESS   ASSESSMENT:  CLINICAL IMPRESSION: Pt reporting HEP is going well, does have soreness in the upper arm after exercises. Continued with manual techniques and passive stretching, pt achieving 75% ROM. Pt completing A/ROM, adding x to v arms and arnold  press in standing. Pt reporting mod fatigue and soreness with novel arnold press exercise. Also adding overhead lacing and functional reaching for activity tolerance work. Verbal cuing for form and technique.   PERFORMANCE DEFICITS: in functional skills including ADLs, IADLs, edema, ROM, strength, pain, fascial restrictions, Gross motor control, body mechanics, and UE functional use.   PLAN:  OT FREQUENCY: 2x/week  OT DURATION: 8 weeks  PLANNED INTERVENTIONS: self care/ADL training, therapeutic exercise, therapeutic activity, manual therapy, scar mobilization, passive range of motion, functional mobility training, electrical stimulation, ultrasound, moist heat, cryotherapy, patient/family education, energy conservation, coping strategies training, and DME and/or AE instructions  CONSULTED AND AGREED WITH PLAN OF CARE: Patient  PLAN FOR NEXT SESSION: Manual Therapy, P/ROM, begin light strengthening as pt is at 9 weeks post op as of 09/12/22, add ball on wall    Ezra Sites, OTR/L  [phone] 09/06/2022, 11:24 AM

## 2022-09-11 ENCOUNTER — Ambulatory Visit (HOSPITAL_COMMUNITY): Payer: PPO | Admitting: Occupational Therapy

## 2022-09-11 ENCOUNTER — Encounter (HOSPITAL_COMMUNITY): Payer: Self-pay | Admitting: Occupational Therapy

## 2022-09-11 DIAGNOSIS — M25611 Stiffness of right shoulder, not elsewhere classified: Secondary | ICD-10-CM

## 2022-09-11 DIAGNOSIS — M25511 Pain in right shoulder: Secondary | ICD-10-CM | POA: Diagnosis not present

## 2022-09-11 DIAGNOSIS — R29898 Other symptoms and signs involving the musculoskeletal system: Secondary | ICD-10-CM

## 2022-09-11 NOTE — Therapy (Signed)
OUTPATIENT OCCUPATIONAL THERAPY ORTHO TREATMENT NOTE  Patient Name: [name] MRN: [id number] DOB:1946/03/26, 77 y.o., male Today's Date: 09/11/2022  PCP: Benita Stabile, MD REFERRING PROVIDER: Ramond Marrow, MD   END OF SESSION:  OT End of Session - 09/11/22 1037     Visit Number 15    Number of Visits 17    Date for OT Re-Evaluation 09/14/22    Authorization Type [employer]    Progress Note Due on Visit 20    OT Start Time 1035    OT Stop Time 1115    OT Time Calculation (min) 40 min    Activity Tolerance Patient tolerated treatment well    Behavior During Therapy [facility] for tasks assessed/performed              Past Medical History:  Diagnosis Date   Allergy to alpha-gal    2015; has been able to re-introduce red meat for the past 2 1/2 years without reaction (as of 01/21/19)   Arthritis    Chest pain    a. 03/2017: echo showing EF of 60-65%, no regional WMA or significant valve abnormalities. b. 03/2016: NST with no evidence of ischemia.    Complication of anesthesia    "anaphylactic" reaction to Xylocaine > 10 years ago; reported later recieved marcaine without issue   Difficult intubation 02/19/2018   No difficulty 01/26/19 with Mac and 3 or 11/10/19 with Glidescope   Digestive problems    on Prednisone prn for this issue- diarrhea   Dyspnea    pulmonary fibrosis   Dysrhythmia    irregular due to Myocarditis- takes Atenolol, Norvasc   GERD (gastroesophageal reflux disease)    H/O viral myocarditis    25 years ago   Head injury, closed, with concussion    Breif LOC   Hyperlipidemia    Hypertension    Mild mitral valve prolapse    per Dr Landry Dyke notes   PAF (paroxysmal atrial fibrillation) (HCC)    a. initially occuring in 02/2016. b. recurrent in 07/2016. Placed on Eliquis   Polymyalgia rheumatica (HCC)    PONV (postoperative nausea and vomiting)    Pre-diabetes    elevated due to high dose of prednsione for back no longer elevated    Pulmonary fibrosis (HCC)    Sleep apnea    no cpap mild   Past Surgical History:  Procedure Laterality Date   ANTERIOR CERVICAL DECOMP/DISCECTOMY FUSION N/A 11/10/2019   Procedure: Cervical Three-Four Anterior cervical decompression/discectomy/fusion;  Surgeon: Barnett Abu, MD;  Location: [facility] OR;  Service: Neurosurgery;  Laterality: N/A;  Cervical Three-Four Anterior cervical decompression/discectomy/fusion   apendectomy  1965   APPENDECTOMY     APPLICATION OF ROBOTIC ASSISTANCE FOR SPINAL PROCEDURE N/A 12/22/2020   Procedure: APPLICATION OF ROBOTIC ASSISTANCE FOR SPINAL PROCEDURE;  Surgeon: Barnett Abu, MD;  Location: [facility] OR;  Service: Neurosurgery;  Laterality: N/A;   BACK SURGERY     bone spur Bilateral 1999   feet   CARDIAC CATHETERIZATION  2001   CHOLECYSTECTOMY  2004   COLON RESECTION N/A 06/15/2018   Procedure: SIGMOID COLON RESECTION;  Surgeon: Griselda Miner, MD;  Location: [facility] ORS;  Service: General;  Laterality: N/A;   COLONOSCOPY     COLOSTOMY N/A 06/15/2018   Procedure: COLOSTOMY;  Surgeon: Griselda Miner, MD;  Location: [facility] ORS;  Service: General;  Laterality: N/A;   COLOSTOMY TAKEDOWN N/A 01/26/2019   Procedure: LAPAROSCOPIC ASSISTED COLOSTOMY TAKEDOWN;  Surgeon: Griselda Miner, MD;  Location:  [facility] OR;  Service: General;  Laterality: N/A;   EYE SURGERY Bilateral    cataract removal   LAPAROSCOPIC LYSIS OF ADHESIONS N/A 01/26/2019   Procedure: LAPAROSCOPIC LYSIS OF ADHESIONS;  Surgeon: Griselda Miner, MD;  Location: [facility] OR;  Service: General;  Laterality: N/A;   LAPAROTOMY N/A 06/15/2018   Procedure: EXPLORATORY LAPAROTOMY;  Surgeon: Griselda Miner, MD;  Location: [facility] ORS;  Service: General;  Laterality: N/A;   LUMBAR LAMINECTOMY/ DECOMPRESSION WITH MET-RX Right 05/05/2013   Procedure: Right Lumbar three-four Extraforaminal Microdiskectomy with Metrex;  Surgeon: Barnett Abu, MD;  Location: [facility] OR[facility];  Service: Neurosurgery;  Laterality: Right;  Right Lumbar three-four  Extraforaminal Microdiskectomy with Metrex   LUNG BIOPSY Right 02/19/2018   Procedure: LUNG BIOPSY;  Surgeon: Loreli Slot, MD;  Location: [facility] OR;  Service: Thoracic;  Laterality: Right;   POSTERIOR LUMBAR FUSION 4 LEVEL N/A 12/22/2020   Procedure: Lumbar one-two Posterior lumbar interbody fusion with stabilization from Thoracic ten to Lumbar one with robotic screw placement;  Surgeon: Barnett Abu, MD;  Location: [facility] OR;  Service: Neurosurgery;  Laterality: N/A;  RM 20   REVERSE SHOULDER ARTHROPLASTY Right 07/11/2022   Procedure: REVERSE SHOULDER ARTHROPLASTY;  Surgeon: Bjorn Pippin, MD;  Location: [facility] ORS;  Service: Orthopedics;  Laterality: Right;   SHOULDER OPEN ROTATOR CUFF REPAIR Bilateral 2001   TONSILLECTOMY     VIDEO ASSISTED THORACOSCOPY Right 02/19/2018   Procedure: VIDEO ASSISTED THORACOSCOPY;  Surgeon: Loreli Slot, MD;  Location: [facility] OR;  Service: Thoracic;  Laterality: Right;   Patient Active Problem List   Diagnosis Date Noted   Spinal stenosis of lumbar region with neurogenic claudication 12/22/2020   Pre-operative respiratory examination 12/13/2020   Cervical spondylosis with myelopathy 11/10/2019   Shortness of breath 08/04/2019   Colostomy in place (HCC) 01/26/2019   Prolonged Q-T interval on ECG 06/25/2018   Immunosuppression due to drug therapy for ILD 06/16/2018   Sigmoid colon perforation s/p Hartmann colectomy/colostomy 06/15/2018 06/15/2018   Perforated diverticulum of large intestine 06/15/2018   OSA (obstructive sleep apnea) 12/13/2016   Snoring 10/26/2016   PAF (paroxysmal atrial fibrillation) (HCC) 08/14/2016   ILD (interstitial lung disease) (HCC) 05/01/2016   Lumbar stenosis 02/01/2015   Giardia 04/14/2014   H/O viral cardiomyopathy 04/14/2014   Herniated nucleus pulposus, L3-4 right 05/05/2013   Other hypertrophic cardiomyopathy (HCC) 04/19/2013   Hyperlipidemia 04/19/2013   Essential hypertension 04/19/2013   Abnormal LFTs 09/16/2012    Nausea 07/10/2011   Chronic diarrhea 07/10/2011   Burping 07/10/2011   Osteoporosis 07/10/2011   Arthritis 07/10/2011    ONSET DATE: 07/11/22  REFERRING DIAG: R Reverse Total Shoulder Arthoscopy  THERAPY DIAG:  Acute pain of right shoulder  Other symptoms and signs involving the musculoskeletal system  Shoulder stiffness, right  Rationale for Evaluation and Treatment: Rehabilitation  SUBJECTIVE:   SUBJECTIVE STATEMENT: S: "I'm doing good I think." Pt accompanied by: self and significant other  PERTINENT HISTORY: Patient reports medical history significant for pulmonary fibrosis, interstitial lung disease.   PRECAUTIONS: Shoulder  WEIGHT BEARING RESTRICTIONS: Yes No Weight Bearing  PAIN:  Are you having pain? Yes: NPRS scale: 2/10 Pain description: sore and tight Aggravating factors: anterior shoulder girdle Relieving factors: rest  FALLS: Has patient fallen in last 6 months? Yes. Number of falls 1 - tripping over a stool  PATIENT GOALS: "To get ROM back in the arm"  NEXT MD VISIT: unsure  OBJECTIVE:   HAND DOMINANCE: Right  ADLs: Overall ADLs:  Pt requiring mod-max assist for all ADL's and IADL's at this time. He is unable to lift items or sustain holds on items due to weight.   FUNCTIONAL OUTCOME MEASURES: FOTO: 34.11 08/21/22: 62.18  UPPER EXTREMITY ROM:     Passive ROM Supine Right eval  Shoulder flexion 123  Shoulder abduction 115  Shoulder internal rotation 90  Shoulder external rotation -9  (Blank rows = not tested)  Active ROM seated Right eval Right 08/21/22  Shoulder flexion  115  Shoulder abduction  110  Shoulder internal rotation  90  Shoulder external rotation  41  (Blank rows = not tested)  UPPER EXTREMITY MMT:     MMT Right eval Right 08/21/22  Shoulder flexion  4-/5  Shoulder abduction  4-/5  Shoulder adduction  4/5  Shoulder extension  4/5  Shoulder internal rotation  3+/5  Shoulder external rotation  4-/5  (Blank rows =  not tested)   EDEMA: Mild to moderate swelling noted around surgical area  OBSERVATIONS: Severe Fascial restrictions noted in subscapularis, biceps, trapezius, and axillary region.   TODAY'S TREATMENT:                                                                                                                              DATE:  09/11/22 -Manual therapy: myofascial release and trigger point applied to biceps, trapezius, scapula, and axillary region in order to address pain and fascial restrictions to improve ROM. -A/ROM: supine-protraction, flexion, abduction, horizontal abduction, er/IR, 15 reps -AA/ROM: seated, 3lb dowel, protraction, flexion, abduction, horizontal abduction, er/IR, 10 reps -Ball on the wall, ABC's (rest @ R) -Goal Post Arms x10 -X to V arms x10 -Ball On the wall: vertical, horizontal, circles both directions, x10  09/06/22 -Manual therapy: myofascial release and trigger point applied to biceps, trapezius, scapula, and axillary region in order to address pain and fascial restrictions to improve ROM. -P/ROM: supine-protraction, flexion, abduction, horizontal abduction, er/IR, 10 reps -A/ROM: supine-protraction, flexion, abduction, horizontal abduction, er/IR, 10 reps -Proximal shoulder strengthening: supine-paddles, criss cross, circles each direction, 20 reps each -X to V arms: 10 reps -Debroah Loop press: 7 reps -Overhead lacing: seated, lacing from top down then reversing -Functional Reaching: placing 10 cones on top shelf of overhead cabinet in flexion, removing in abduction -UBE: level 1, 2' forward 2' reverse, pace: 5.0  09/04/22 -Manual therapy: myofascial release and trigger point applied to biceps, trapezius, scapula, and axillary region in order to address pain and fascial restrictions to improve ROM. -A/ROM: seated, flexion, abduction, protraction, horizontal abduction, er/IR, x12 -Loop Band Exercises: green band, wall taps, V ups, Wall Clocks, x10 -Scapular  strengthening: green band, extension, retraction, rows, x12 -Wall Slides: flexion, abduction, x15    PATIENT EDUCATION: Education details: continue HEP Person educated: Patient Education method: Explanation, Demonstration, and Handouts Education comprehension: verbalized understanding and returned demonstration  HOME EXERCISE PROGRAM: 3/1: Pendulums 3/5: Table Slides 3/12: Isometrics 3/28: AA/ROM 4/3: Wall Slides and A/ROM 4/10: Scapular  Strengthening (red band) 4/23: Loop Band Exercises  GOALS: Goals reviewed with patient? Yes  SHORT TERM GOALS: Target date: 08/17/22  Pt will be provided with and educated on HEP to improve mobility in RUE required for use during ADL completion.  Goal status: IN PROGRESS  2.   Pt will increase RUE P/ROM by 40 degrees or greater to improve ability to use LUE during dressing tasks with minimal compensatory techniques.  Goal status: IN PROGRESS  3.  Pt will increase RUE strength to 3+/5 to improve ability to reach for items at waist to chest height during bathing and grooming tasks.  Goal status: IN PROGRESS   LONG TERM GOALS: Target date: 09/14/22  Pt will decrease pain in RUE to 3/10 or less to improve ability to sleep for 2+ consecutive hours without waking due to pain.  Goal status: IN PROGRESS  2.  Pt will decrease RUE fascial restrictions to min amounts or less to improve mobility required for functional reaching tasks.  Goal status: IN PROGRESS  3.  Pt will increase RUE A/ROM to at least 145 for flexion/abduction, and 50 degrees er/IR to improve ability to use RUE when reaching overhead or behind back during dressing and bathing tasks.  Goal status: IN PROGRESS  4.  Pt will increase RUE strength to 4+/5 or greater to improve ability to use LUE when lifting or carrying items during meal preparation/housework/yardwork tasks. Goal status: IN PROGRESS  5.  Pt will return to highest level of function using RUE as non-dominant during  functional task completion.  Goal status: IN PROGRESS   ASSESSMENT:  CLINICAL IMPRESSION: This session pt continuing to demonstrate improvements with ROM and strength. He is able to achieve approximately 75-80% of full ROM sitting/standing. This session he demonstrated improved endurance, as he was able to complete multiple ball on the wall exercises with shoulder flexed to 90 degrees. Additionally, pt completing x to v arms and goal post arms with good movement pattern. OT providing verbal and tactile cuing for positioning and technique with all exercises.    PERFORMANCE DEFICITS: in functional skills including ADLs, IADLs, edema, ROM, strength, pain, fascial restrictions, Gross motor control, body mechanics, and UE functional use.   PLAN:  OT FREQUENCY: 2x/week  OT DURATION: 8 weeks  PLANNED INTERVENTIONS: self care/ADL training, therapeutic exercise, therapeutic activity, manual therapy, scar mobilization, passive range of motion, functional mobility training, electrical stimulation, ultrasound, moist heat, cryotherapy, patient/family education, energy conservation, coping strategies training, and DME and/or AE instructions  CONSULTED AND AGREED WITH PLAN OF CARE: Patient  PLAN FOR NEXT SESSION: Manual Therapy, P/ROM, begin light strengthening as pt is at 9 weeks post op as of 09/12/22, add ball on wall    Trish Mage, OTR/L [phone] 09/11/2022, 10:38 AM

## 2022-09-13 ENCOUNTER — Encounter (HOSPITAL_COMMUNITY): Payer: PPO | Admitting: Occupational Therapy

## 2022-09-18 ENCOUNTER — Encounter (HOSPITAL_COMMUNITY): Payer: PPO | Admitting: Occupational Therapy

## 2022-09-20 ENCOUNTER — Encounter (HOSPITAL_COMMUNITY): Payer: Self-pay | Admitting: Occupational Therapy

## 2022-09-20 ENCOUNTER — Ambulatory Visit (HOSPITAL_COMMUNITY): Payer: PPO | Attending: Orthopaedic Surgery | Admitting: Occupational Therapy

## 2022-09-20 DIAGNOSIS — M25511 Pain in right shoulder: Secondary | ICD-10-CM | POA: Diagnosis not present

## 2022-09-20 DIAGNOSIS — M25611 Stiffness of right shoulder, not elsewhere classified: Secondary | ICD-10-CM | POA: Insufficient documentation

## 2022-09-20 DIAGNOSIS — R29898 Other symptoms and signs involving the musculoskeletal system: Secondary | ICD-10-CM | POA: Diagnosis not present

## 2022-09-20 NOTE — Therapy (Signed)
OUTPATIENT OCCUPATIONAL THERAPY ORTHO TREATMENT NOTE  Patient Name: [name] MRN: [id number] DOB:1946-03-05, 77 y.o., male Today's Date: 09/20/2022  PCP: Benita Stabile, MD REFERRING PROVIDER: Ramond Marrow, MD   END OF SESSION:  OT End of Session - 09/20/22 1123     Visit Number 16    Number of Visits 17    Date for OT Re-Evaluation 09/14/22    Authorization Type [employer]    Progress Note Due on Visit 20    OT Start Time 1120    OT Stop Time 1200    OT Time Calculation (min) 40 min    Activity Tolerance Patient tolerated treatment well    Behavior During Therapy [facility] for tasks assessed/performed              Past Medical History:  Diagnosis Date   Allergy to alpha-gal    2015; has been able to re-introduce red meat for the past 2 1/2 years without reaction (as of 01/21/19)   Arthritis    Chest pain    a. 03/2017: echo showing EF of 60-65%, no regional WMA or significant valve abnormalities. b. 03/2016: NST with no evidence of ischemia.    Complication of anesthesia    "anaphylactic" reaction to Xylocaine > 10 years ago; reported later recieved marcaine without issue   Difficult intubation 02/19/2018   No difficulty 01/26/19 with Mac and 3 or 11/10/19 with Glidescope   Digestive problems    on Prednisone prn for this issue- diarrhea   Dyspnea    pulmonary fibrosis   Dysrhythmia    irregular due to Myocarditis- takes Atenolol, Norvasc   GERD (gastroesophageal reflux disease)    H/O viral myocarditis    25 years ago   Head injury, closed, with concussion    Breif LOC   Hyperlipidemia    Hypertension    Mild mitral valve prolapse    per Dr Landry Dyke notes   PAF (paroxysmal atrial fibrillation) (HCC)    a. initially occuring in 02/2016. b. recurrent in 07/2016. Placed on Eliquis   Polymyalgia rheumatica (HCC)    PONV (postoperative nausea and vomiting)    Pre-diabetes    elevated due to high dose of prednsione for back no longer elevated    Pulmonary fibrosis (HCC)    Sleep apnea    no cpap mild   Past Surgical History:  Procedure Laterality Date   ANTERIOR CERVICAL DECOMP/DISCECTOMY FUSION N/A 11/10/2019   Procedure: Cervical Three-Four Anterior cervical decompression/discectomy/fusion;  Surgeon: Barnett Abu, MD;  Location: [facility] OR;  Service: Neurosurgery;  Laterality: N/A;  Cervical Three-Four Anterior cervical decompression/discectomy/fusion   apendectomy  1965   APPENDECTOMY     APPLICATION OF ROBOTIC ASSISTANCE FOR SPINAL PROCEDURE N/A 12/22/2020   Procedure: APPLICATION OF ROBOTIC ASSISTANCE FOR SPINAL PROCEDURE;  Surgeon: Barnett Abu, MD;  Location: [facility] OR;  Service: Neurosurgery;  Laterality: N/A;   BACK SURGERY     bone spur Bilateral 1999   feet   CARDIAC CATHETERIZATION  2001   CHOLECYSTECTOMY  2004   COLON RESECTION N/A 06/15/2018   Procedure: SIGMOID COLON RESECTION;  Surgeon: Griselda Miner, MD;  Location: [facility] ORS;  Service: General;  Laterality: N/A;   COLONOSCOPY     COLOSTOMY N/A 06/15/2018   Procedure: COLOSTOMY;  Surgeon: Griselda Miner, MD;  Location: [facility] ORS;  Service: General;  Laterality: N/A;   COLOSTOMY TAKEDOWN N/A 01/26/2019   Procedure: LAPAROSCOPIC ASSISTED COLOSTOMY TAKEDOWN;  Surgeon: Griselda Miner, MD;  Location:  [facility] OR;  Service: General;  Laterality: N/A;   EYE SURGERY Bilateral    cataract removal   LAPAROSCOPIC LYSIS OF ADHESIONS N/A 01/26/2019   Procedure: LAPAROSCOPIC LYSIS OF ADHESIONS;  Surgeon: Griselda Miner, MD;  Location: [facility] OR;  Service: General;  Laterality: N/A;   LAPAROTOMY N/A 06/15/2018   Procedure: EXPLORATORY LAPAROTOMY;  Surgeon: Griselda Miner, MD;  Location: [facility] ORS;  Service: General;  Laterality: N/A;   LUMBAR LAMINECTOMY/ DECOMPRESSION WITH MET-RX Right 05/05/2013   Procedure: Right Lumbar three-four Extraforaminal Microdiskectomy with Metrex;  Surgeon: Barnett Abu, MD;  Location: [facility] OR[facility];  Service: Neurosurgery;  Laterality: Right;  Right Lumbar three-four  Extraforaminal Microdiskectomy with Metrex   LUNG BIOPSY Right 02/19/2018   Procedure: LUNG BIOPSY;  Surgeon: Loreli Slot, MD;  Location: [facility] OR;  Service: Thoracic;  Laterality: Right;   POSTERIOR LUMBAR FUSION 4 LEVEL N/A 12/22/2020   Procedure: Lumbar one-two Posterior lumbar interbody fusion with stabilization from Thoracic ten to Lumbar one with robotic screw placement;  Surgeon: Barnett Abu, MD;  Location: [facility] OR;  Service: Neurosurgery;  Laterality: N/A;  RM 20   REVERSE SHOULDER ARTHROPLASTY Right 07/11/2022   Procedure: REVERSE SHOULDER ARTHROPLASTY;  Surgeon: Bjorn Pippin, MD;  Location: [facility] ORS;  Service: Orthopedics;  Laterality: Right;   SHOULDER OPEN ROTATOR CUFF REPAIR Bilateral 2001   TONSILLECTOMY     VIDEO ASSISTED THORACOSCOPY Right 02/19/2018   Procedure: VIDEO ASSISTED THORACOSCOPY;  Surgeon: Loreli Slot, MD;  Location: [facility] OR;  Service: Thoracic;  Laterality: Right;   Patient Active Problem List   Diagnosis Date Noted   Spinal stenosis of lumbar region with neurogenic claudication 12/22/2020   Pre-operative respiratory examination 12/13/2020   Cervical spondylosis with myelopathy 11/10/2019   Shortness of breath 08/04/2019   Colostomy in place (HCC) 01/26/2019   Prolonged Q-T interval on ECG 06/25/2018   Immunosuppression due to drug therapy for ILD 06/16/2018   Sigmoid colon perforation s/p Hartmann colectomy/colostomy 06/15/2018 06/15/2018   Perforated diverticulum of large intestine 06/15/2018   OSA (obstructive sleep apnea) 12/13/2016   Snoring 10/26/2016   PAF (paroxysmal atrial fibrillation) (HCC) 08/14/2016   ILD (interstitial lung disease) (HCC) 05/01/2016   Lumbar stenosis 02/01/2015   Giardia 04/14/2014   H/O viral cardiomyopathy 04/14/2014   Herniated nucleus pulposus, L3-4 right 05/05/2013   Other hypertrophic cardiomyopathy (HCC) 04/19/2013   Hyperlipidemia 04/19/2013   Essential hypertension 04/19/2013   Abnormal LFTs 09/16/2012    Nausea 07/10/2011   Chronic diarrhea 07/10/2011   Burping 07/10/2011   Osteoporosis 07/10/2011   Arthritis 07/10/2011    ONSET DATE: 07/11/22  REFERRING DIAG: R Reverse Total Shoulder Arthoscopy  THERAPY DIAG:  Acute pain of right shoulder  Other symptoms and signs involving the musculoskeletal system  Shoulder stiffness, right  Rationale for Evaluation and Treatment: Rehabilitation  SUBJECTIVE:   SUBJECTIVE STATEMENT: S: "I'm doing good I think." Pt accompanied by: self and significant other  PERTINENT HISTORY: Patient reports medical history significant for pulmonary fibrosis, interstitial lung disease.   PRECAUTIONS: Shoulder  WEIGHT BEARING RESTRICTIONS: Yes No Weight Bearing  PAIN:  Are you having pain? Yes: NPRS scale: 2/10 Pain description: sore and tight Aggravating factors: anterior shoulder girdle Relieving factors: rest  FALLS: Has patient fallen in last 6 months? Yes. Number of falls 1 - tripping over a stool  PATIENT GOALS: "To get ROM back in the arm"  NEXT MD VISIT: unsure  OBJECTIVE:   HAND DOMINANCE: Right  ADLs: Overall ADLs:  Pt requiring mod-max assist for all ADL's and IADL's at this time. He is unable to lift items or sustain holds on items due to weight.   FUNCTIONAL OUTCOME MEASURES: FOTO: 34.11 08/21/22: 62.18  UPPER EXTREMITY ROM:     Passive ROM Supine Right eval  Shoulder flexion 123  Shoulder abduction 115  Shoulder internal rotation 90  Shoulder external rotation -9  (Blank rows = not tested)  Active ROM seated Right eval Right 08/21/22 Right 09/20/22  Shoulder flexion  115 144  Shoulder abduction  110 152  Shoulder internal rotation  90 90  Shoulder external rotation  41 48  (Blank rows = not tested)  UPPER EXTREMITY MMT:     MMT Right eval Right 08/21/22 Right 09/20/22  Shoulder flexion  4-/5 4+/5  Shoulder abduction  4-/5 4/5  Shoulder adduction  4/5 4+/5  Shoulder extension  4/5 4+/5  Shoulder internal  rotation  3+/5 4/5  Shoulder external rotation  4-/5 4/5  (Blank rows = not tested)   EDEMA: Mild to moderate swelling noted around surgical area  OBSERVATIONS: Severe Fascial restrictions noted in subscapularis, biceps, trapezius, and axillary region.   TODAY'S TREATMENT:                                                                                                                              DATE:  09/20/22 -Manual therapy: myofascial release and trigger point applied to biceps, trapezius, scapula, and axillary region in order to address pain and fascial restrictions to improve ROM. -A/ROM: seated-protraction, flexion, abduction, horizontal abduction, er/IR, 15 reps -Shoulder Strengthening: 2lb dumbbells, protraction, flexion, abduction, horizontal abduction, er/IR, x10 -Ball Roll on the wall: flexion, abduction, x10 -Scapular Strengthening: green band, extension, row, retraction, x12  09/11/22 -Manual therapy: myofascial release and trigger point applied to biceps, trapezius, scapula, and axillary region in order to address pain and fascial restrictions to improve ROM. -A/ROM: supine-protraction, flexion, abduction, horizontal abduction, er/IR, 15 reps -AA/ROM: seated, 3lb dowel, protraction, flexion, abduction, horizontal abduction, er/IR, 10 reps -Ball on the wall, ABC's (rest @ R) -Goal Post Arms x10 -X to V arms x10 -Ball On the wall: vertical, horizontal, circles both directions, x10  09/06/22 -Manual therapy: myofascial release and trigger point applied to biceps, trapezius, scapula, and axillary region in order to address pain and fascial restrictions to improve ROM. -P/ROM: supine-protraction, flexion, abduction, horizontal abduction, er/IR, 10 reps -A/ROM: supine-protraction, flexion, abduction, horizontal abduction, er/IR, 10 reps -Proximal shoulder strengthening: supine-paddles, criss cross, circles each direction, 20 reps each -X to V arms: 10 reps -Arnold press: 7  reps -Overhead lacing: seated, lacing from top down then reversing -Functional Reaching: placing 10 cones on top shelf of overhead cabinet in flexion, removing in abduction -UBE: level 1, 2' forward 2' reverse, pace: 5.0   PATIENT EDUCATION: Education details: Public relations account executive w/dumbbells Person educated: Patient Education method: Explanation, Demonstration, and Handouts Education comprehension: verbalized understanding and returned demonstration  HOME EXERCISE PROGRAM:  3/1: Pendulums 3/5: Table Slides 3/12: Isometrics 3/28: AA/ROM 4/3: Wall Slides and A/ROM 4/10: Scapular Strengthening (red band) 4/23: Loop Band Exercises 5/9: Shoulder Strengthening w/ dumbbells  GOALS: Goals reviewed with patient? Yes  SHORT TERM GOALS: Target date: 08/17/22  Pt will be provided with and educated on HEP to improve mobility in RUE required for use during ADL completion.  Goal status: IN PROGRESS  2.   Pt will increase RUE P/ROM by 40 degrees or greater to improve ability to use LUE during dressing tasks with minimal compensatory techniques.  Goal status: IN PROGRESS  3.  Pt will increase RUE strength to 3+/5 to improve ability to reach for items at waist to chest height during bathing and grooming tasks.  Goal status: IN PROGRESS   LONG TERM GOALS: Target date: 09/14/22  Pt will decrease pain in RUE to 3/10 or less to improve ability to sleep for 2+ consecutive hours without waking due to pain.  Goal status: IN PROGRESS  2.  Pt will decrease RUE fascial restrictions to min amounts or less to improve mobility required for functional reaching tasks.  Goal status: IN PROGRESS  3.  Pt will increase RUE A/ROM to at least 145 for flexion/abduction, and 50 degrees er/IR to improve ability to use RUE when reaching overhead or behind back during dressing and bathing tasks.  Goal status: IN PROGRESS  4.  Pt will increase RUE strength to 4+/5 or greater to improve ability to use LUE when  lifting or carrying items during meal preparation/housework/yardwork tasks. Goal status: IN PROGRESS  5.  Pt will return to highest level of function using RUE as non-dominant during functional task completion.  Goal status: IN PROGRESS   ASSESSMENT:  CLINICAL IMPRESSION: Pt continuing to make improvements with ROM. This session he was able to obtain approximately 80% of full ROM and he was able to tolerate 2lb dumbbells while completing ROM this session. Additionally, he was able to progress to green theraband for scapular strengthening with no difficulties. OT providing verbal and tactile cuing for positioning and technique with all exercises.   PERFORMANCE DEFICITS: in functional skills including ADLs, IADLs, edema, ROM, strength, pain, fascial restrictions, Gross motor control, body mechanics, and UE functional use.   PLAN:  OT FREQUENCY: 2x/week  OT DURATION: 8 weeks  PLANNED INTERVENTIONS: self care/ADL training, therapeutic exercise, therapeutic activity, manual therapy, scar mobilization, passive range of motion, functional mobility training, electrical stimulation, ultrasound, moist heat, cryotherapy, patient/family education, energy conservation, coping strategies training, and DME and/or AE instructions  CONSULTED AND AGREED WITH PLAN OF CARE: Patient  PLAN FOR NEXT SESSION: Manual Therapy, A/ROM, Shoulder Strengthening, Proximal Shoulder Exercises, Therapy Ball Exercises, increase weight/resistance as tolerated, add ball on wall    Trish Mage, OTR/L [phone] 09/20/2022, 11:24 AM

## 2022-09-24 ENCOUNTER — Ambulatory Visit (HOSPITAL_COMMUNITY): Payer: PPO | Admitting: Occupational Therapy

## 2022-09-24 ENCOUNTER — Encounter (HOSPITAL_COMMUNITY): Payer: Self-pay | Admitting: Occupational Therapy

## 2022-09-24 DIAGNOSIS — M25611 Stiffness of right shoulder, not elsewhere classified: Secondary | ICD-10-CM

## 2022-09-24 DIAGNOSIS — M25511 Pain in right shoulder: Secondary | ICD-10-CM

## 2022-09-24 DIAGNOSIS — R29898 Other symptoms and signs involving the musculoskeletal system: Secondary | ICD-10-CM

## 2022-09-24 NOTE — Therapy (Signed)
OUTPATIENT OCCUPATIONAL THERAPY ORTHO TREATMENT NOTE  Patient Name: Demariae Panagopoulos MRN: [id number] DOB:06-06-1945, 77 y.o., male Today's Date: 09/24/2022  PCP: Benita Stabile, MD REFERRING PROVIDER: Ramond Marrow, MD   END OF SESSION:   09/24/22 1122  OT Visits / Re-Eval  Visit Number 17  Number of Visits 25  Date for OT Re-Evaluation 10/26/22  Authorization  Authorization Type [employer]  Progress Note Due on Visit 20  OT Time Calculation  OT Start Time 1115  OT Stop Time 1200  OT Time Calculation (min) 45 min  End of Session  Activity Tolerance Patient tolerated treatment well  Behavior During Therapy [facility] for tasks assessed/performed      Past Medical History:  Diagnosis Date   Allergy to alpha-gal    2015; has been able to re-introduce red meat for the past 2 1/2 years without reaction (as of 01/21/19)   Arthritis    Chest pain    a. 03/2017: echo showing EF of 60-65%, no regional WMA or significant valve abnormalities. b. 03/2016: NST with no evidence of ischemia.    Complication of anesthesia    "anaphylactic" reaction to Xylocaine > 10 years ago; reported later recieved marcaine without issue   Difficult intubation 02/19/2018   No difficulty 01/26/19 with Mac and 3 or 11/10/19 with Glidescope   Digestive problems    on Prednisone prn for this issue- diarrhea   Dyspnea    pulmonary fibrosis   Dysrhythmia    irregular due to Myocarditis- takes Atenolol, Norvasc   GERD (gastroesophageal reflux disease)    H/O viral myocarditis    25 years ago   Head injury, closed, with concussion    Breif LOC   Hyperlipidemia    Hypertension    Mild mitral valve prolapse    per Dr Landry Dyke notes   PAF (paroxysmal atrial fibrillation) (HCC)    a. initially occuring in 02/2016. b. recurrent in 07/2016. Placed on Eliquis   Polymyalgia rheumatica (HCC)    PONV (postoperative nausea and vomiting)    Pre-diabetes    elevated due to high dose of prednsione for back  no longer elevated   Pulmonary fibrosis (HCC)    Sleep apnea    no cpap mild   Past Surgical History:  Procedure Laterality Date   ANTERIOR CERVICAL DECOMP/DISCECTOMY FUSION N/A 11/10/2019   Procedure: Cervical Three-Four Anterior cervical decompression/discectomy/fusion;  Surgeon: Barnett Abu, MD;  Location: [facility] OR;  Service: Neurosurgery;  Laterality: N/A;  Cervical Three-Four Anterior cervical decompression/discectomy/fusion   apendectomy  1965   APPENDECTOMY     APPLICATION OF ROBOTIC ASSISTANCE FOR SPINAL PROCEDURE N/A 12/22/2020   Procedure: APPLICATION OF ROBOTIC ASSISTANCE FOR SPINAL PROCEDURE;  Surgeon: Barnett Abu, MD;  Location: [facility] OR;  Service: Neurosurgery;  Laterality: N/A;   BACK SURGERY     bone spur Bilateral 1999   feet   CARDIAC CATHETERIZATION  2001   CHOLECYSTECTOMY  2004   COLON RESECTION N/A 06/15/2018   Procedure: SIGMOID COLON RESECTION;  Surgeon: Griselda Miner, MD;  Location: [facility] ORS;  Service: General;  Laterality: N/A;   COLONOSCOPY     COLOSTOMY N/A 06/15/2018   Procedure: COLOSTOMY;  Surgeon: Griselda Miner, MD;  Location: [facility] ORS;  Service: General;  Laterality: N/A;   COLOSTOMY TAKEDOWN N/A 01/26/2019   Procedure: LAPAROSCOPIC ASSISTED COLOSTOMY TAKEDOWN;  Surgeon: Griselda Miner, MD;  Location: [facility] OR;  Service: General;  Laterality: N/A;   EYE SURGERY Bilateral    cataract removal  LAPAROSCOPIC LYSIS OF ADHESIONS N/A 01/26/2019   Procedure: LAPAROSCOPIC LYSIS OF ADHESIONS;  Surgeon: Griselda Miner, MD;  Location: [facility] OR;  Service: General;  Laterality: N/A;   LAPAROTOMY N/A 06/15/2018   Procedure: EXPLORATORY LAPAROTOMY;  Surgeon: Griselda Miner, MD;  Location: [facility] ORS;  Service: General;  Laterality: N/A;   LUMBAR LAMINECTOMY/ DECOMPRESSION WITH MET-RX Right 05/05/2013   Procedure: Right Lumbar three-four Extraforaminal Microdiskectomy with Metrex;  Surgeon: Barnett Abu, MD;  Location: [facility];  Service: Neurosurgery;  Laterality: Right;  Right Lumbar  three-four Extraforaminal Microdiskectomy with Metrex   LUNG BIOPSY Right 02/19/2018   Procedure: LUNG BIOPSY;  Surgeon: Loreli Slot, MD;  Location: [facility] OR;  Service: Thoracic;  Laterality: Right;   POSTERIOR LUMBAR FUSION 4 LEVEL N/A 12/22/2020   Procedure: Lumbar one-two Posterior lumbar interbody fusion with stabilization from Thoracic ten to Lumbar one with robotic screw placement;  Surgeon: Barnett Abu, MD;  Location: [facility] OR;  Service: Neurosurgery;  Laterality: N/A;  RM 20   REVERSE SHOULDER ARTHROPLASTY Right 07/11/2022   Procedure: REVERSE SHOULDER ARTHROPLASTY;  Surgeon: Bjorn Pippin, MD;  Location: [facility] ORS;  Service: Orthopedics;  Laterality: Right;   SHOULDER OPEN ROTATOR CUFF REPAIR Bilateral 2001   TONSILLECTOMY     VIDEO ASSISTED THORACOSCOPY Right 02/19/2018   Procedure: VIDEO ASSISTED THORACOSCOPY;  Surgeon: Loreli Slot, MD;  Location: [facility] OR;  Service: Thoracic;  Laterality: Right;   Patient Active Problem List   Diagnosis Date Noted   Spinal stenosis of lumbar region with neurogenic claudication 12/22/2020   Pre-operative respiratory examination 12/13/2020   Cervical spondylosis with myelopathy 11/10/2019   Shortness of breath 08/04/2019   Colostomy in place (HCC) 01/26/2019   Prolonged Q-T interval on ECG 06/25/2018   Immunosuppression due to drug therapy for ILD 06/16/2018   Sigmoid colon perforation s/p Hartmann colectomy/colostomy 06/15/2018 06/15/2018   Perforated diverticulum of large intestine 06/15/2018   OSA (obstructive sleep apnea) 12/13/2016   Snoring 10/26/2016   PAF (paroxysmal atrial fibrillation) (HCC) 08/14/2016   ILD (interstitial lung disease) (HCC) 05/01/2016   Lumbar stenosis 02/01/2015   Giardia 04/14/2014   H/O viral cardiomyopathy 04/14/2014   Herniated nucleus pulposus, L3-4 right 05/05/2013   Other hypertrophic cardiomyopathy (HCC) 04/19/2013   Hyperlipidemia 04/19/2013   Essential hypertension 04/19/2013   Abnormal LFTs  09/16/2012   Nausea 07/10/2011   Chronic diarrhea 07/10/2011   Burping 07/10/2011   Osteoporosis 07/10/2011   Arthritis 07/10/2011    ONSET DATE: 07/11/22  REFERRING DIAG: R Reverse Total Shoulder Arthoscopy  THERAPY DIAG:  Acute pain of right shoulder  Other symptoms and signs involving the musculoskeletal system  Shoulder stiffness, right  Rationale for Evaluation and Treatment: Rehabilitation  SUBJECTIVE:   SUBJECTIVE STATEMENT: S: "I'm able to reach up into my shelf now" Pt accompanied by: self and significant other  PERTINENT HISTORY: Patient reports medical history significant for pulmonary fibrosis, interstitial lung disease.   PRECAUTIONS: Shoulder  WEIGHT BEARING RESTRICTIONS: Yes No Weight Bearing  PAIN:  Are you having pain? Yes: NPRS scale: 2/10 Pain description: sore and tight Aggravating factors: anterior shoulder girdle Relieving factors: rest  FALLS: Has patient fallen in last 6 months? Yes. Number of falls 1 - tripping over a stool  PATIENT GOALS: "To get ROM back in the arm"  NEXT MD VISIT: unsure  OBJECTIVE:   HAND DOMINANCE: Right  ADLs: Overall ADLs: Pt requiring mod-max assist for all ADL's and IADL's at this time. He is unable to  lift items or sustain holds on items due to weight.   FUNCTIONAL OUTCOME MEASURES: FOTO: 34.11 08/21/22: 62.18 09/20/22: 62.64  UPPER EXTREMITY ROM:     Passive ROM Supine Right eval  Shoulder flexion 123  Shoulder abduction 115  Shoulder internal rotation 90  Shoulder external rotation -9  (Blank rows = not tested)  Active ROM seated Right eval Right 08/21/22 Right 09/20/22  Shoulder flexion  115 144  Shoulder abduction  110 152  Shoulder internal rotation  90 90  Shoulder external rotation  41 48  (Blank rows = not tested)  UPPER EXTREMITY MMT:     MMT Right eval Right 08/21/22 Right 09/20/22  Shoulder flexion  4-/5 4+/5  Shoulder abduction  4-/5 4/5  Shoulder adduction  4/5 4+/5  Shoulder  extension  4/5 4+/5  Shoulder internal rotation  3+/5 4/5  Shoulder external rotation  4-/5 4/5  (Blank rows = not tested)   EDEMA: Mild to moderate swelling noted around surgical area  OBSERVATIONS: Severe Fascial restrictions noted in subscapularis, biceps, trapezius, and axillary region.   TODAY'S TREATMENT:                                                                                                                              DATE:  09/24/22 -A/ROM: seated-protraction, flexion, abduction, horizontal abduction, er/IR, 15 reps -Shoulder Strengthening: 2lb dumbbells, protraction, flexion, abduction, horizontal abduction, er/IR, x15 -Proximal Shoulder Strengthening: paddles, criss cross, circles each direction, 20 reps each -Goal Post Arms x10, 2lb dumbbells -X to V arms x10, 2lb dumbbells -[facility] on the wall: flexion, abduction, x10  09/20/22 -Manual therapy: myofascial release and trigger point applied to biceps, trapezius, scapula, and axillary region in order to address pain and fascial restrictions to improve ROM. -A/ROM: seated-protraction, flexion, abduction, horizontal abduction, er/IR, 15 reps -Shoulder Strengthening: 2lb dumbbells, protraction, flexion, abduction, horizontal abduction, er/IR, x10 -Ball Roll on the wall: flexion, abduction, x10 -Scapular Strengthening: green band, extension, row, retraction, x12  09/11/22 -Manual therapy: myofascial release and trigger point applied to biceps, trapezius, scapula, and axillary region in order to address pain and fascial restrictions to improve ROM. -A/ROM: supine-protraction, flexion, abduction, horizontal abduction, er/IR, 15 reps -AA/ROM: seated, 3lb dowel, protraction, flexion, abduction, horizontal abduction, er/IR, 10 reps -Ball on the wall, ABC's (rest @ R) -Goal Post Arms x10 -X to V arms x10 -Ball On the wall: vertical, horizontal, circles both directions, x10   PATIENT EDUCATION: Education details: Physiological scientist w/dumbbells Person educated: Patient Education method: Programmer, multimedia, Demonstration, and Handouts Education comprehension: verbalized understanding and returned demonstration  HOME EXERCISE PROGRAM: 3/1: Pendulums 3/5: Table Slides 3/12: Isometrics 3/28: AA/ROM 4/3: Wall Slides and A/ROM 4/10: Scapular Strengthening (red band) 4/23: Loop Band Exercises 5/9: Shoulder Strengthening w/ dumbbells  GOALS: Goals reviewed with patient? Yes  SHORT TERM GOALS: Target date: 08/17/22  Pt will be provided with and educated on HEP to improve mobility in RUE required for use during  ADL completion.  Goal status: IN PROGRESS  2.   Pt will increase RUE P/ROM by 40 degrees or greater to improve ability to use LUE during dressing tasks with minimal compensatory techniques.  Goal status: IN PROGRESS  3.  Pt will increase RUE strength to 3+/5 to improve ability to reach for items at waist to chest height during bathing and grooming tasks.  Goal status: IN PROGRESS   LONG TERM GOALS: Target date: 09/14/22  Pt will decrease pain in RUE to 3/10 or less to improve ability to sleep for 2+ consecutive hours without waking due to pain.  Goal status: IN PROGRESS  2.  Pt will decrease RUE fascial restrictions to min amounts or less to improve mobility required for functional reaching tasks.  Goal status: IN PROGRESS  3.  Pt will increase RUE A/ROM to at least 145 for flexion/abduction, and 50 degrees er/IR to improve ability to use RUE when reaching overhead or behind back during dressing and bathing tasks.  Goal status: IN PROGRESS  4.  Pt will increase RUE strength to 4+/5 or greater to improve ability to use LUE when lifting or carrying items during meal preparation/housework/yardwork tasks. Goal status: IN PROGRESS  5.  Pt will return to highest level of function using RUE as non-dominant during functional task completion.  Goal status: IN PROGRESS   ASSESSMENT:  CLINICAL  IMPRESSION: This session focused on strengthening, as pt continues to have difficulties with endurance and lifting items. OT attempted to have pt use 3lb dumbbells this session, however pt was unable to maintain good movement pattern with them, therefore they returned down to 2lb dumbbells with increasing reps from 10 to 15 with each exercise. He required multiple rest breaks this session due to muscle fatigue and increased soreness. OT providing verbal and tactile cuing to limit shoulder hiking and improve positioning and technique with all exercises.   PERFORMANCE DEFICITS: in functional skills including ADLs, IADLs, edema, ROM, strength, pain, fascial restrictions, Gross motor control, body mechanics, and UE functional use.   PLAN:  OT FREQUENCY: 2x/week  OT DURATION: 8 weeks  PLANNED INTERVENTIONS: self care/ADL training, therapeutic exercise, therapeutic activity, manual therapy, scar mobilization, passive range of motion, functional mobility training, electrical stimulation, ultrasound, moist heat, cryotherapy, patient/family education, energy conservation, coping strategies training, and DME and/or AE instructions  CONSULTED AND AGREED WITH PLAN OF CARE: Patient  PLAN FOR NEXT SESSION: Manual Therapy, A/ROM, Shoulder Strengthening, Proximal Shoulder Exercises, Therapy Ball Exercises, increase weight/resistance as tolerated, add ball on wall    Trish Mage, OTR/L [phone] 09/24/2022, 11:25 AM

## 2022-09-25 DIAGNOSIS — M19011 Primary osteoarthritis, right shoulder: Secondary | ICD-10-CM | POA: Diagnosis not present

## 2022-09-26 ENCOUNTER — Encounter (HOSPITAL_COMMUNITY): Payer: PPO | Admitting: Occupational Therapy

## 2022-10-01 ENCOUNTER — Encounter (HOSPITAL_COMMUNITY): Payer: Self-pay | Admitting: Occupational Therapy

## 2022-10-01 ENCOUNTER — Ambulatory Visit (HOSPITAL_COMMUNITY): Payer: PPO | Admitting: Occupational Therapy

## 2022-10-01 DIAGNOSIS — R29898 Other symptoms and signs involving the musculoskeletal system: Secondary | ICD-10-CM

## 2022-10-01 DIAGNOSIS — M25511 Pain in right shoulder: Secondary | ICD-10-CM

## 2022-10-01 DIAGNOSIS — M25611 Stiffness of right shoulder, not elsewhere classified: Secondary | ICD-10-CM

## 2022-10-01 NOTE — Therapy (Signed)
OUTPATIENT OCCUPATIONAL THERAPY ORTHO TREATMENT NOTE  Patient Name: [name] MRN: [id number] DOB:1945/06/20, 77 y.o., male Today's Date: 10/01/2022  PCP: Benita Stabile, MD REFERRING PROVIDER: Ramond Marrow, MD   END OF SESSION:  OT End of Session - 10/01/22 1428     Visit Number 18    Number of Visits 25    Date for OT Re-Evaluation 10/26/22    Authorization Type [employer]    Progress Note Due on Visit 20    OT Start Time 1346    OT Stop Time 1427    OT Time Calculation (min) 41 min    Activity Tolerance Patient tolerated treatment well    Behavior During Therapy [facility] for tasks assessed/performed               Past Medical History:  Diagnosis Date   Allergy to alpha-gal    2015; has been able to re-introduce red meat for the past 2 1/2 years without reaction (as of 01/21/19)   Arthritis    Chest pain    a. 03/2017: echo showing EF of 60-65%, no regional WMA or significant valve abnormalities. b. 03/2016: NST with no evidence of ischemia.    Complication of anesthesia    "anaphylactic" reaction to Xylocaine > 10 years ago; reported later recieved marcaine without issue   Difficult intubation 02/19/2018   No difficulty 01/26/19 with Mac and 3 or 11/10/19 with Glidescope   Digestive problems    on Prednisone prn for this issue- diarrhea   Dyspnea    pulmonary fibrosis   Dysrhythmia    irregular due to Myocarditis- takes Atenolol, Norvasc   GERD (gastroesophageal reflux disease)    H/O viral myocarditis    25 years ago   Head injury, closed, with concussion    Breif LOC   Hyperlipidemia    Hypertension    Mild mitral valve prolapse    per Dr Landry Dyke notes   PAF (paroxysmal atrial fibrillation) (HCC)    a. initially occuring in 02/2016. b. recurrent in 07/2016. Placed on Eliquis   Polymyalgia rheumatica (HCC)    PONV (postoperative nausea and vomiting)    Pre-diabetes    elevated due to high dose of prednsione for back no longer elevated    Pulmonary fibrosis (HCC)    Sleep apnea    no cpap mild   Past Surgical History:  Procedure Laterality Date   ANTERIOR CERVICAL DECOMP/DISCECTOMY FUSION N/A 11/10/2019   Procedure: Cervical Three-Four Anterior cervical decompression/discectomy/fusion;  Surgeon: Barnett Abu, MD;  Location: [facility] OR;  Service: Neurosurgery;  Laterality: N/A;  Cervical Three-Four Anterior cervical decompression/discectomy/fusion   apendectomy  1965   APPENDECTOMY     APPLICATION OF ROBOTIC ASSISTANCE FOR SPINAL PROCEDURE N/A 12/22/2020   Procedure: APPLICATION OF ROBOTIC ASSISTANCE FOR SPINAL PROCEDURE;  Surgeon: Barnett Abu, MD;  Location: [facility] OR;  Service: Neurosurgery;  Laterality: N/A;   BACK SURGERY     bone spur Bilateral 1999   feet   CARDIAC CATHETERIZATION  2001   CHOLECYSTECTOMY  2004   COLON RESECTION N/A 06/15/2018   Procedure: SIGMOID COLON RESECTION;  Surgeon: Griselda Miner, MD;  Location: [facility] ORS;  Service: General;  Laterality: N/A;   COLONOSCOPY     COLOSTOMY N/A 06/15/2018   Procedure: COLOSTOMY;  Surgeon: Griselda Miner, MD;  Location: [facility] ORS;  Service: General;  Laterality: N/A;   COLOSTOMY TAKEDOWN N/A 01/26/2019   Procedure: LAPAROSCOPIC ASSISTED COLOSTOMY TAKEDOWN;  Surgeon: Griselda Miner, MD;  Location: [facility] OR;  Service: General;  Laterality: N/A;   EYE SURGERY Bilateral    cataract removal   LAPAROSCOPIC LYSIS OF ADHESIONS N/A 01/26/2019   Procedure: LAPAROSCOPIC LYSIS OF ADHESIONS;  Surgeon: Griselda Miner, MD;  Location: [facility] OR;  Service: General;  Laterality: N/A;   LAPAROTOMY N/A 06/15/2018   Procedure: EXPLORATORY LAPAROTOMY;  Surgeon: Griselda Miner, MD;  Location: [facility] ORS;  Service: General;  Laterality: N/A;   LUMBAR LAMINECTOMY/ DECOMPRESSION WITH MET-RX Right 05/05/2013   Procedure: Right Lumbar three-four Extraforaminal Microdiskectomy with Metrex;  Surgeon: Barnett Abu, MD;  Location: [facility] OR[facility];  Service: Neurosurgery;  Laterality: Right;  Right Lumbar three-four  Extraforaminal Microdiskectomy with Metrex   LUNG BIOPSY Right 02/19/2018   Procedure: LUNG BIOPSY;  Surgeon: Loreli Slot, MD;  Location: [facility] OR;  Service: Thoracic;  Laterality: Right;   POSTERIOR LUMBAR FUSION 4 LEVEL N/A 12/22/2020   Procedure: Lumbar one-two Posterior lumbar interbody fusion with stabilization from Thoracic ten to Lumbar one with robotic screw placement;  Surgeon: Barnett Abu, MD;  Location: [facility] OR;  Service: Neurosurgery;  Laterality: N/A;  RM 20   REVERSE SHOULDER ARTHROPLASTY Right 07/11/2022   Procedure: REVERSE SHOULDER ARTHROPLASTY;  Surgeon: Bjorn Pippin, MD;  Location: [facility] ORS;  Service: Orthopedics;  Laterality: Right;   SHOULDER OPEN ROTATOR CUFF REPAIR Bilateral 2001   TONSILLECTOMY     VIDEO ASSISTED THORACOSCOPY Right 02/19/2018   Procedure: VIDEO ASSISTED THORACOSCOPY;  Surgeon: Loreli Slot, MD;  Location: [facility] OR;  Service: Thoracic;  Laterality: Right;   Patient Active Problem List   Diagnosis Date Noted   Spinal stenosis of lumbar region with neurogenic claudication 12/22/2020   Pre-operative respiratory examination 12/13/2020   Cervical spondylosis with myelopathy 11/10/2019   Shortness of breath 08/04/2019   Colostomy in place (HCC) 01/26/2019   Prolonged Q-T interval on ECG 06/25/2018   Immunosuppression due to drug therapy for ILD 06/16/2018   Sigmoid colon perforation s/p Hartmann colectomy/colostomy 06/15/2018 06/15/2018   Perforated diverticulum of large intestine 06/15/2018   OSA (obstructive sleep apnea) 12/13/2016   Snoring 10/26/2016   PAF (paroxysmal atrial fibrillation) (HCC) 08/14/2016   ILD (interstitial lung disease) (HCC) 05/01/2016   Lumbar stenosis 02/01/2015   Giardia 04/14/2014   H/O viral cardiomyopathy 04/14/2014   Herniated nucleus pulposus, L3-4 right 05/05/2013   Other hypertrophic cardiomyopathy (HCC) 04/19/2013   Hyperlipidemia 04/19/2013   Essential hypertension 04/19/2013   Abnormal LFTs 09/16/2012    Nausea 07/10/2011   Chronic diarrhea 07/10/2011   Burping 07/10/2011   Osteoporosis 07/10/2011   Arthritis 07/10/2011    ONSET DATE: 07/11/22  REFERRING DIAG: R Reverse Total Shoulder Arthoscopy  THERAPY DIAG:  Acute pain of right shoulder  Other symptoms and signs involving the musculoskeletal system  Shoulder stiffness, right  Rationale for Evaluation and Treatment: Rehabilitation  SUBJECTIVE:   SUBJECTIVE STATEMENT: S: "I went to the doctor and he said to rest it for a week so that's what I did."  PERTINENT HISTORY: Patient reports medical history significant for pulmonary fibrosis, interstitial lung disease.   PRECAUTIONS: Shoulder  WEIGHT BEARING RESTRICTIONS: Yes No Weight Bearing  PAIN:  Are you having pain? No  FALLS: Has patient fallen in last 6 months? Yes. Number of falls 1 - tripping over a stool  PATIENT GOALS: "To get ROM back in the arm"  NEXT MD VISIT: unsure  OBJECTIVE:   HAND DOMINANCE: Right  ADLs: Overall ADLs: Pt requiring mod-max assist for all ADL's and  IADL's at this time. He is unable to lift items or sustain holds on items due to weight.   FUNCTIONAL OUTCOME MEASURES: FOTO: 34.11 08/21/22: 62.18 09/20/22: 62.64  UPPER EXTREMITY ROM:     Passive ROM Supine Right eval  Shoulder flexion 123  Shoulder abduction 115  Shoulder internal rotation 90  Shoulder external rotation -9  (Blank rows = not tested)  Active ROM seated Right eval Right 08/21/22 Right 09/20/22  Shoulder flexion  115 144  Shoulder abduction  110 152  Shoulder internal rotation  90 90  Shoulder external rotation  41 48  (Blank rows = not tested)  UPPER EXTREMITY MMT:     MMT Right eval Right 08/21/22 Right 09/20/22  Shoulder flexion  4-/5 4+/5  Shoulder abduction  4-/5 4/5  Shoulder adduction  4/5 4+/5  Shoulder extension  4/5 4+/5  Shoulder internal rotation  3+/5 4/5  Shoulder external rotation  4-/5 4/5  (Blank rows = not tested)   EDEMA: Mild to  moderate swelling noted around surgical area  OBSERVATIONS: Severe Fascial restrictions noted in subscapularis, biceps, trapezius, and axillary region.   TODAY'S TREATMENT:                                                                                                                              DATE:  10/01/22 -Manual therapy: myofascial release and trigger point applied to biceps, trapezius, scapula, and axillary region in order to address pain and fascial restrictions to improve ROM. -P/ROM: supine-flexion, abduction, er/IR, horizontal abduction, 5 reps  -Shoulder Strengthening: supine-2# dumbbells, protraction, flexion, abduction, horizontal abduction, x10 -A/ROM: supine-er/IR, 10 reps -Proximal shoulder strengthening: supine, 2#-paddles, criss cross, circles each direction, 10 reps -Serratus anterior punches: supine, 2#-10 reps -Theraband strengthening: red-protraction, flexion, abduction, horizontal abduction, er/IR, 10 reps -Functional reaching: 1# wrist weight-placing 10 cones on top shelf of overhead cabinet in flexion, removing in abduction -Overhead lacing: 1# wrist weight-lacing from top down then reversing  09/24/22 -A/ROM: seated-protraction, flexion, abduction, horizontal abduction, er/IR, 15 reps -Shoulder Strengthening: 2lb dumbbells, protraction, flexion, abduction, horizontal abduction, er/IR, x15 -Proximal Shoulder Strengthening: paddles, criss cross, circles each direction, 20 reps each -Goal Post Arms x10, 2lb dumbbells -X to V arms x10, 2lb dumbbells -[facility] on the wall: flexion, abduction, x10  09/20/22 -Manual therapy: myofascial release and trigger point applied to biceps, trapezius, scapula, and axillary region in order to address pain and fascial restrictions to improve ROM. -A/ROM: seated-protraction, flexion, abduction, horizontal abduction, er/IR, 15 reps -Shoulder Strengthening: 2lb dumbbells, protraction, flexion, abduction, horizontal abduction, er/IR,  x10 -Ball Roll on the wall: flexion, abduction, x10 -Scapular Strengthening: green band, extension, row, retraction, x12   PATIENT EDUCATION: Education details:  Person educated: Patient Education method: Programmer, multimedia, Facilities manager, and Handouts Education comprehension: verbalized understanding and returned demonstration  HOME EXERCISE PROGRAM: 3/1: Pendulums 3/5: Table Slides 3/12: Isometrics 3/28: AA/ROM 4/3: Wall Slides and A/ROM 4/10: Scapular Strengthening (red band) 4/23: Loop Band Exercises 5/9:  Shoulder Strengthening w/ dumbbells  GOALS: Goals reviewed with patient? Yes  SHORT TERM GOALS: Target date: 08/17/22  Pt will be provided with and educated on HEP to improve mobility in RUE required for use during ADL completion.  Goal status: IN PROGRESS  2.   Pt will increase RUE P/ROM by 40 degrees or greater to improve ability to use LUE during dressing tasks with minimal compensatory techniques.  Goal status: IN PROGRESS  3.  Pt will increase RUE strength to 3+/5 to improve ability to reach for items at waist to chest height during bathing and grooming tasks.  Goal status: IN PROGRESS   LONG TERM GOALS: Target date: 09/14/22  Pt will decrease pain in RUE to 3/10 or less to improve ability to sleep for 2+ consecutive hours without waking due to pain.  Goal status: IN PROGRESS  2.  Pt will decrease RUE fascial restrictions to min amounts or less to improve mobility required for functional reaching tasks.  Goal status: IN PROGRESS  3.  Pt will increase RUE A/ROM to at least 145 for flexion/abduction, and 50 degrees er/IR to improve ability to use RUE when reaching overhead or behind back during dressing and bathing tasks.  Goal status: IN PROGRESS  4.  Pt will increase RUE strength to 4+/5 or greater to improve ability to use LUE when lifting or carrying items during meal preparation/housework/yardwork tasks. Goal status: IN PROGRESS  5.  Pt will return to highest level  of function using RUE as non-dominant during functional task completion.  Goal status: IN PROGRESS   ASSESSMENT:  CLINICAL IMPRESSION: Pt reports last session his arm gave out doing wall slides, saw MD the next day for a scheduled follow-up, MD is not worried about the popping, instructed pt to rest his arm for 1 week with no exercises. Pt reports he had pain for 3-4 days, today does not have pain. Resumed manual techniques and passive stretching. Completed gentle strengthening in supine to begin with, working on strengthening in a gravity eliminated plane. Pt does have increased pain with weighted or quick external rotation. Pt completing serratus anterior punches and proximal shoulder strengthening in supine with good form and no increased pain. Added red theraband strengthening, functional reaching, and overhead lacing. Pt without increased pain, verbal cuing for form and technique.   PERFORMANCE DEFICITS: in functional skills including ADLs, IADLs, edema, ROM, strength, pain, fascial restrictions, Gross motor control, body mechanics, and UE functional use.   PLAN:  OT FREQUENCY: 2x/week  OT DURATION: 8 weeks  PLANNED INTERVENTIONS: self care/ADL training, therapeutic exercise, therapeutic activity, manual therapy, scar mobilization, passive range of motion, functional mobility training, electrical stimulation, ultrasound, moist heat, cryotherapy, patient/family education, energy conservation, coping strategies training, and DME and/or AE instructions  CONSULTED AND AGREED WITH PLAN OF CARE: Patient  PLAN FOR NEXT SESSION: Manual Therapy, A/ROM, Shoulder Strengthening, Proximal Shoulder Exercises, Therapy Ball Exercises, increase weight/resistance as tolerated, add ball on wall    Ezra Sites, OTR/L  [phone] 10/01/2022, 2:28 PM

## 2022-10-02 ENCOUNTER — Encounter (HOSPITAL_COMMUNITY): Payer: PPO | Admitting: Occupational Therapy

## 2022-10-04 ENCOUNTER — Encounter (HOSPITAL_COMMUNITY): Payer: PPO | Admitting: Occupational Therapy

## 2022-10-10 ENCOUNTER — Encounter (HOSPITAL_COMMUNITY): Payer: PPO | Admitting: Occupational Therapy

## 2022-10-15 DIAGNOSIS — R0600 Dyspnea, unspecified: Secondary | ICD-10-CM | POA: Diagnosis not present

## 2022-10-15 DIAGNOSIS — R059 Cough, unspecified: Secondary | ICD-10-CM | POA: Diagnosis not present

## 2022-10-15 DIAGNOSIS — U071 COVID-19: Secondary | ICD-10-CM | POA: Diagnosis not present

## 2022-10-16 ENCOUNTER — Encounter (HOSPITAL_COMMUNITY): Payer: PPO | Admitting: Occupational Therapy

## 2022-10-18 ENCOUNTER — Encounter (HOSPITAL_COMMUNITY): Payer: PPO | Admitting: Occupational Therapy

## 2022-10-23 ENCOUNTER — Encounter (HOSPITAL_COMMUNITY): Payer: PPO | Admitting: Occupational Therapy

## 2022-11-20 DIAGNOSIS — L821 Other seborrheic keratosis: Secondary | ICD-10-CM | POA: Diagnosis not present

## 2022-11-20 DIAGNOSIS — L853 Xerosis cutis: Secondary | ICD-10-CM | POA: Diagnosis not present

## 2022-11-20 DIAGNOSIS — X32XXXD Exposure to sunlight, subsequent encounter: Secondary | ICD-10-CM | POA: Diagnosis not present

## 2022-11-20 DIAGNOSIS — L570 Actinic keratosis: Secondary | ICD-10-CM | POA: Diagnosis not present

## 2022-11-21 DIAGNOSIS — J84112 Idiopathic pulmonary fibrosis: Secondary | ICD-10-CM | POA: Diagnosis not present

## 2022-11-21 DIAGNOSIS — E663 Overweight: Secondary | ICD-10-CM | POA: Diagnosis not present

## 2022-11-21 DIAGNOSIS — Z7952 Long term (current) use of systemic steroids: Secondary | ICD-10-CM | POA: Diagnosis not present

## 2022-11-21 DIAGNOSIS — S32010D Wedge compression fracture of first lumbar vertebra, subsequent encounter for fracture with routine healing: Secondary | ICD-10-CM | POA: Diagnosis not present

## 2022-11-21 DIAGNOSIS — M8080XD Other osteoporosis with current pathological fracture, unspecified site, subsequent encounter for fracture with routine healing: Secondary | ICD-10-CM | POA: Diagnosis not present

## 2022-11-21 DIAGNOSIS — M5136 Other intervertebral disc degeneration, lumbar region: Secondary | ICD-10-CM | POA: Diagnosis not present

## 2022-11-21 DIAGNOSIS — M353 Polymyalgia rheumatica: Secondary | ICD-10-CM | POA: Diagnosis not present

## 2022-11-21 DIAGNOSIS — R768 Other specified abnormal immunological findings in serum: Secondary | ICD-10-CM | POA: Diagnosis not present

## 2022-11-21 DIAGNOSIS — Z6827 Body mass index (BMI) 27.0-27.9, adult: Secondary | ICD-10-CM | POA: Diagnosis not present

## 2022-12-10 DIAGNOSIS — M5415 Radiculopathy, thoracolumbar region: Secondary | ICD-10-CM | POA: Diagnosis not present

## 2022-12-10 DIAGNOSIS — M5116 Intervertebral disc disorders with radiculopathy, lumbar region: Secondary | ICD-10-CM | POA: Diagnosis not present

## 2023-01-08 DIAGNOSIS — E1169 Type 2 diabetes mellitus with other specified complication: Secondary | ICD-10-CM | POA: Diagnosis not present

## 2023-01-08 DIAGNOSIS — Z125 Encounter for screening for malignant neoplasm of prostate: Secondary | ICD-10-CM | POA: Diagnosis not present

## 2023-01-08 DIAGNOSIS — E785 Hyperlipidemia, unspecified: Secondary | ICD-10-CM | POA: Diagnosis not present

## 2023-01-15 DIAGNOSIS — I480 Paroxysmal atrial fibrillation: Secondary | ICD-10-CM | POA: Diagnosis not present

## 2023-01-15 DIAGNOSIS — K5900 Constipation, unspecified: Secondary | ICD-10-CM | POA: Diagnosis not present

## 2023-01-15 DIAGNOSIS — M353 Polymyalgia rheumatica: Secondary | ICD-10-CM | POA: Diagnosis not present

## 2023-01-15 DIAGNOSIS — M19019 Primary osteoarthritis, unspecified shoulder: Secondary | ICD-10-CM | POA: Diagnosis not present

## 2023-01-15 DIAGNOSIS — M19011 Primary osteoarthritis, right shoulder: Secondary | ICD-10-CM | POA: Diagnosis not present

## 2023-01-15 DIAGNOSIS — E785 Hyperlipidemia, unspecified: Secondary | ICD-10-CM | POA: Diagnosis not present

## 2023-01-15 DIAGNOSIS — J849 Interstitial pulmonary disease, unspecified: Secondary | ICD-10-CM | POA: Diagnosis not present

## 2023-01-15 DIAGNOSIS — I2510 Atherosclerotic heart disease of native coronary artery without angina pectoris: Secondary | ICD-10-CM | POA: Diagnosis not present

## 2023-01-15 DIAGNOSIS — Z23 Encounter for immunization: Secondary | ICD-10-CM | POA: Diagnosis not present

## 2023-01-15 DIAGNOSIS — I10 Essential (primary) hypertension: Secondary | ICD-10-CM | POA: Diagnosis not present

## 2023-01-15 DIAGNOSIS — G4700 Insomnia, unspecified: Secondary | ICD-10-CM | POA: Diagnosis not present

## 2023-01-15 DIAGNOSIS — E1169 Type 2 diabetes mellitus with other specified complication: Secondary | ICD-10-CM | POA: Diagnosis not present

## 2023-02-12 DIAGNOSIS — Z6827 Body mass index (BMI) 27.0-27.9, adult: Secondary | ICD-10-CM | POA: Diagnosis not present

## 2023-02-12 DIAGNOSIS — D6949 Other primary thrombocytopenia: Secondary | ICD-10-CM | POA: Diagnosis not present

## 2023-02-12 DIAGNOSIS — E778 Other disorders of glycoprotein metabolism: Secondary | ICD-10-CM | POA: Diagnosis not present

## 2023-02-12 DIAGNOSIS — E8809 Other disorders of plasma-protein metabolism, not elsewhere classified: Secondary | ICD-10-CM | POA: Diagnosis not present

## 2023-02-12 DIAGNOSIS — R110 Nausea: Secondary | ICD-10-CM | POA: Diagnosis not present

## 2023-02-12 DIAGNOSIS — Z713 Dietary counseling and surveillance: Secondary | ICD-10-CM | POA: Diagnosis not present

## 2023-02-12 DIAGNOSIS — I10 Essential (primary) hypertension: Secondary | ICD-10-CM | POA: Diagnosis not present

## 2023-02-12 DIAGNOSIS — D7589 Other specified diseases of blood and blood-forming organs: Secondary | ICD-10-CM | POA: Diagnosis not present

## 2023-02-12 DIAGNOSIS — N401 Enlarged prostate with lower urinary tract symptoms: Secondary | ICD-10-CM | POA: Diagnosis not present

## 2023-02-12 DIAGNOSIS — R5383 Other fatigue: Secondary | ICD-10-CM | POA: Diagnosis not present

## 2023-03-28 ENCOUNTER — Encounter: Payer: Self-pay | Admitting: Internal Medicine

## 2023-03-28 ENCOUNTER — Ambulatory Visit: Payer: PPO | Admitting: Internal Medicine

## 2023-03-28 VITALS — BP 108/60 | HR 87 | Ht 68.0 in | Wt 187.0 lb

## 2023-03-28 DIAGNOSIS — J849 Interstitial pulmonary disease, unspecified: Secondary | ICD-10-CM

## 2023-03-28 DIAGNOSIS — R0602 Shortness of breath: Secondary | ICD-10-CM

## 2023-03-28 NOTE — Patient Instructions (Signed)
Spirometry/DLCO performed today. 

## 2023-03-28 NOTE — Progress Notes (Signed)
Synopsis: Referred in November 2017 for evaluation of shortness of breath.  Found to have diffuse parenchymal lung disease of undetermined cause. An open lung biopsy performed on 02/19/2018 showed an early fibrotic process suggestive of UIP.  He only smoked cigarettes for about 3 to 6 months when he was a teenager.  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    OCt 2019   Mr. Druckenmiller returns to clinic today to discuss the results from his recent open lung biopsy which showed early fibrotic changes suggestive of UIP as well as emphysema.  He tells me today that he is slowly feeling better but he does still have some fatigue and shortness of breath.  He has not been exercising much lately.  He tried to go fishing yesterday and he says that any walking was quite difficult for him.  He says that he is not coughing too much right now.  He is taking prednisone still as directed by her clinic earlier this month and he is wondering what to do with that.  He is still taking a bronchodilator.  He tells me today that he only smokes cigarettes for about 3 to 6 months when he was a teenager.  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   OV March 2020 [name] has returned to clinic today to see me.  He recently was hospitalized for diverticulitis, perforated, requiring emergent surgery. He says that his wound is slowly healing. He resumed CellCept and prednisone per my recommendation. His wife says that his wound is healing fairly well.  His shortness of breath is at baseline.  No recent pneumonia or bronchitis.  He is taking his nintedanib.   PATIENT ID: Jimmy Footman Beauchamp GENDER: male DOB: [birth year], MRN: [id number]  Chief Complaint  Patient presents with   Follow-up    Patient has quit taking the [facility] and has felt a lot  better since then. Patient was taking Stiolto but got thrush and took some Nystatin and it resolved it. Patient needs back surgery in about 3-4 months.      OV 04/16/2019: Mr. Droessler is a 77 year old gentleman who presents for follow-up of NSIP.  He has had worse symptoms since interrupting his CellCept and nintedanib for his colectomy reversal.  He had bleeding when he initially restarted these medicines a few weeks postop, which required stopping them again.  He restarted his nintedanib last week and CellCept with Bactrim about 2 weeks prior.  He has not had any additional bleeding.  No abdominal pain.  He feels better than he did before surgery.  He still has mild dyspnea, not worse than his last visit.  He notices it most when he is leaning over.  No other complaints.  His PCP did his 71-month lab work earlier this week, and he will call the office to request they send [place] results.   OV 03/19/2019: Mr. Laplante is a 77 year old gentleman with history of ILD to NSIP and polymyalgia rheumatica on chronic immunosuppression who presents for follow-up.  He underwent colostomy reversal on 01/26/2019, for which his CellCept plus Bactrim and nintedanib were held due to concern for potential surgical complications.  Several weeks following surgery his medications were restarted, and within a day or 2 he developed rectal bleeding.  This continued for several days until he notified  us, and we instructed him to stop.  Within the next day or 2 the bleeding has stopped.  He notices that his breathing has started to get a little bit worse over the last few weeks, and he would like to restart his medications.  He feels like he cannot exert himself due to his mild dyspnea.  He denies abdominal pain, fevers, nausea, vomiting, diarrhea, constipation, or periodic rectal bleeding.  He is following up with Dr. Carolynne Edouard surgery tomorrow.  He had had a colonoscopy just prior to surgery, but is unsure if he had hemorrhoids.  He  follows with Dr. Dierdre Forth in rheumatology.  His prednisone has been tapered down to 7 mg, which will be his dose for now.  There has been mention of possibly starting Plaquenil in the future for his polymyalgia rheumatica.  OV 01/21/19: Mr. Gulbrandsen is a 77 year old gentleman with a history of fibrosing NSIP diagnosed on open lung biopsy in 2019 who presents for routine follow-up and preoperative evaluation before colostomy reversal next week.  He has a history of polymyalgia rheumatica for which she has been on chronic steroids.  He is followed by Dr. Dierdre Forth from rheumatology.  Currently he is being titrated off his chronic prednisone, currently on 8 mg daily.  For his NSIP he is maintained on CellCept, nintedanib, and Bactrim 3 days a week.  He is doing well, better than last year from breathlessness standpoint.  He went hiking with his wife a few weeks ago, which he reports he would have been unable to do a year ago.  He has nausea and diarrhea associated with nintedanib, but he is able to tolerate these with the use of Zofran and Imodium.  No jaundice, itching, right upper quadrant pain.  In February 2020 he underwent emergency partial colectomy with diverting colostomy for perforated diverticulitis.  He is planning to undergo colostomy reversal next week.  In July he fell, causing an L1 compression fracture.  Dr. Dierdre Forth is planning to do a bone mineral density study, and currently he is not on calcium and vitamin D supplements.  In a few months he is planning on having a small outpatient back surgery to address a bone spur that is causing radicular pain in his left leg.  Today he wants to discuss plans for his pulmonary meds perioperatively.  He has previously had symptomatic renal insufficiency when he tried to come off prednisone on his own too quickly.  During his VATS and colectomy he received 10 mg of dexamethasone in the OR, and he received low-dose Solu-Medrol postoperatively from his  colectomy.     OV 07/21/19: Mr. Goon is a 77 year old gentleman with a history of ILD- UIP vs NSIP on chronic Ofev, mycophenolate who presents for follow-up.  He required disruption of these medications for several months in late 2020 his colostomy reversal.  When the meds were restarted postoperatively he had persistent bleeding, leading to prolonged time off medications and restarting them sequentially.  He feels that his breathing has gotten worse over time since then, but is unsure if that was all due to being off the meds or if it is continued to decline since then.  His breathing is usually worse during warmer weather.  He has noticed that his activities are more limited than they used to be.  He recently had an outpatient back surgery where he had to have bone fragments removed that were pinching and nerves.  He was developing left lower extremity weakness preoperatively.  Due to  ongoing nerve impingement that is presumed to be due to inflammation, he is currently on a prednisone taper.  Currently on 20 mg daily and will decrease to 15 mg daily tomorrow.  He is on chronic prednisone for polymyalgia rheumatica at 7 mg daily.  He has been on Ofev 100 mg twice daily.  He was initially started on 150 mg twice daily, but required decrease in his dose due to nausea, vomiting, diarrhea.  Over time the symptoms have improved.  He restarted his meds postoperatively in the fall 2020 he had symptoms short-term, but noticed improvement over time.  When he had GI symptoms at the higher dose, he had some improvement with Zofran and Phenergan.  He is currently not having blood in his stools or other issues related to surgery.    He and his wife are planning a cross-country trip with their RV to visit children and grandchildren later this spring where they will be gone for 2 months.  Reviewed rheumatology, Dr. Shawnee Knapp clinic note 06/15/2019.  Considering starting Plaquenil to reduce dose of prednisone.   OV  4?!5/21  Mr. Blatchley is a 77 year old gentleman with a history of IPF who presents for follow-up.  He is accompanied by his wife today.  He recently had a severe shortness of breath episode that was precipitated by being around a significant amount of sawdust.  He did well with Stiolto, but developed thrush, which has been an issue for him with inhalers in the past.  He is improved with nystatin.  He stopped his Ofev due to severe intractable nausea and vomiting.  He now feels much better and is back to normal.  His shortness of breath is persistent-he has trouble walking around the house.  He has continued on his Bactrim and CellCept plus prednisone for polymyalgia rheumatica.  He wants to know if he can stop his Eliquis to be able to take an NSAID for help with his joint pain.  He has new leg edema bilaterally, but no edema elsewhere.  No chest pain.       OV 10/22/2019  Subjective:  Patient ID: Tiffany Kocher, male , DOB: 1945-05-31 , age 77 y.o. , MRN: [id number] , ADDRESS: [address] [place] [address]   PCP Benita Stabile, MD Rheum - Dr Keturah Shavers 0Dr McQuaid -> Dr Zoe Lan -> Dr Marchelle Gearing  10/22/2019 -   Chief Complaint  Patient presents with   Follow-up    Pt being referred to Dr. Marchelle Gearing by Dr. Chestine Spore for ILD.  Pt states he has been doing good since last visit and states that he feels SOB is stable. Denies any complaints of cough or chest discomfort.  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  - in 2019 : considered progressive pre-bx (on PFT/CT per notes)  - in 2019 - clinical CTD ruled out by Dr. Lyanne Co record review_  -High-resolution CT March 2021: Indeterminate for UIP without change since 2017  RX  -  Mid-endJan 2020 -s tarted cellcept/bactrim + ofev (course complicated early February 2020 by diverticulitis and intermittent side effects with stop start of the nintedanib)  Pathological tissue mild emphysema present on surgical lung biopsy October 2019 [not evident on pulmonary function testing]  - Alpha 1 - MM:  Oct 2019  -On inhaler therapy  -No obstruction seen on PFT or emphysema on CT   History of perforated sigmoid diverticulitis early February 2021 few weeks after starting nintedanib/CellCept  -Status post reversal of colostomy in September 2020   HPI Shanen Santarosa Liebman 77 y.o. -presents to the ILD center with the above history.  Is a transfer of care to the ILD center.  Recently his case was discussed in June 2021 with interstitial lung disease multidisciplinary conference.  His pathology though showed fibroblastic foci did have then reformed branching fibrosis.  There was ossification.  There is no honeycombing.  There was heterogeneity and fibroblastic foci suggestive of UIP.  Overall it was felt that he had unclassifiable ILD.  The radiologist felt his CT scan had not progressed between 2018 and 2021.  His pulmonary function test were variable.  Therefore it was recommended that he be seen at the ILD center.  His ILD diagnosis as above.  He tells me though that when he started the nintedanib he started feeling better.  Although at the same time or before that he was also committed to inhaler therapy based on pathological emphysema on his lung biopsy from 2019.  This is also helped.  Therefore he is a concomitant diagnosis COPD/emphysema.  He is currently here to discuss his future care options.  He tells me  that he has continued CellCept and Bactrim for his ILD since early 2020 except for interruptions due to surgery.  He has been off nintedanib but recently has rechallenged himself at 150 mg once daily for the last 1 month and he is tolerating it fine.  His rheumatologist is working down on his prednisone to 5 mg/day [this is for polymyalgia rheumatica].  He tells me he has upcoming spinal surgery to the C-spine and later to the L-spine.  He wants clearance for this.  Noted he is on anticoagulation with Eliquis and has had bleeding episode.  [place] [facility] Comprehensive ILD Questionnaire  Symptoms:    Overall he is reporting progressive dyspnea although the conference it was felt radiology did not change over time.  Is been going on insidious onset for the last 3 years.  Dyspnea present on exertion relieved by rest.     Past Medical History : As above   ROS: Positive for fatigue and diffuse arthralgia.  No dysphagia.  No dry eyes.  No Raynaud's no weight loss.  No vomiting.  No rash   FAMILY HISTORY of LUNG DISEASE:  -Father had asthma.  No COPD no sarcoid no cystic fibrosis no hypersensitive pneumonitis no autoimmune disease.  EXPOSURE HISTORY: Briefly smoked cigarettes for 3 years.  Otherwise never smoked  Cigarettes.  He did smoke marijuana very little in 1972 and never use cocaine.  No intravenous drug use     HOME and HOBBY DETAILS : [place].  Age of the home is 65 years.  He has lived in the home for 30 years.  No dampness no mildew no mold no humidifier use no CPAP use no nebulizer use.  No steam iron use.  No Jacuzzi use.  There is a misting Fountain in the house because his wife likes it.  But there is no mold or mildew.  No pet gerbils.  No feather pillows.  No mold in the [facility] duct.  No music habits no guarding habits.  No flood of water damage.  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  In the past he is done woodwork, Software engineer work, Event organiser.  He has been exposed to asbestos woodwork and furniture work.  During furniture work he got exposed to meth saline chloride.  He also is a Art gallery manager  PULMONARY TOXICITY HISTORY (27 items): CellCept, prednisone and nintedanib       ROS - per HPI  IMPRESSION: COMPARISON:  CT chest, 12/06/2017, 06/13/2016, 04/02/2016   FINDINGS: Cardiovascular: Aortic atherosclerosis. Normal heart size. No pericardial effusion.   Mediastinum/Nodes: No enlarged mediastinal, hilar, or axillary lymph nodes. Thyroid gland, trachea, and esophagus demonstrate no significant findings.   Lungs/Pleura: Wedge resections of the right lung. Redemonstrated mild pulmonary fibrosis with a slight apical to basal gradient featuring tubular bronchiectasis in the lower lobes, peripheral irregular interstitial pulmonary opacity, and no evidence of bronchiolectasis or honeycombing. Fibrotic findings are not significantly changed compared to prior examinations. No significant air trapping on expiratory phase examination. No pleural effusion or pneumothorax.  1. No significant change in mild pulmonary fibrosis with a slight apical to basal gradient featuring tubular bronchiectasis in the lower lobes, peripheral irregular interstitial pulmonary opacity, and no evidence of bronchiolectasis or honeycombing. No significant air trapping on expiratory phase examination. Findings remain consistent with "indeterminate for UIP" pattern fibrosis by ATS pulmonary fibrosis criteria. Findings are indeterminate for UIP per consensus guidelines: Diagnosis of Idiopathic Pulmonary Fibrosis: An Official ATS/ERS/JRS/ALAT Clinical Practice Guideline. Am Rosezetta Schlatter Crit Care Med Vol 198, Iss 5, [phone], Jan 12 2017.   2.  Interval wedge biopsies of the right lung.   3.   Aortic Atherosclerosis (ICD10-I70.0).     Electronically Signed   By: Lauralyn Primes M.D.   On: 07/30/2019 16:33     OV 12/30/2019  Subjective:  Patient ID: Tiffany Kocher, male , DOB: 1945-09-10 , age 49 y.o. , MRN: [id number] , ADDRESS: [address] [place] [address]   12/30/2019 -   Chief Complaint  Patient presents with   Follow-up    reports feeling "about the same" since last visit. Last albuterol use was this past weekend   Follow-up unclassifiable interstitial lung disease with trace positive rheumatoid factor  HPI Jovahn Wohlwend Murtaugh 77 y.o. -presents for follow-up. Since his last visit we recheck his autoimmune panel. His rheumatoid factor is only trace positive. He had pulmonary function test today and is documented below the stable. His spring 2021 CT chest is stable. Overall stability for few years. He again tells me that he is much better than he was 18 months ago in terms of shortness of breath. Symptom scores document that. At this point in time he is off CellCept. He thinks his improvement is related to starting nintedanib. His wife is here with him and I am meeting her for the first time. She did acknowledge that this potential that he is feeling better because back then when he had the PVCs of ILD diagnosed he was not in a good frame of mind. Because he was dealing with back and neck issues. This is because his pre and post  nintedanib PFT shows stability. Since his last visit he is off CellCept. He has had a C-spine surgery. His back surgery has been postponed. However he is dealing with significant diarrhea. The diarrhea is despite stopping CellCept. This is because of nintedanib. He currently has dropped his nintedanib to 100 mg once daily but despite that is still having diarrhea. He does take some coffee in the morning with sugar but despite that has diarrhea. He prefers that he stop the nintedanib but in the past he acknowledges that it helped  him.      OV 07/06/2020  Subjective:  Patient ID: Tiffany Kocher, male , DOB: September 10, 1945 , age 51 y.o. , MRN: [id number] , ADDRESS: [address] [place] [address] PCP Benita Stabile, MD Patient Care Team: Benita Stabile, MD as PCP - General (Internal Medicine) Lennette Bihari, MD as PCP - Cardiology (Cardiology)  This Provider for this visit: Treatment Team:  Attending Provider: Kalman Shan, MD    07/06/2020 -   Chief Complaint  Patient presents with   Follow-up    PFT performed today.  Pt states he feels like he might have declined some since last visit. Pt states his breathing is some worse. Denies any complaints of cough.      HPI Truen Schaul Czarnecki 77 y.o. -returns for follow-up for his ILD.  He presents with his wife.  He is on supportive care.  He quit taking his CellCept and nintedanib in the summer 2021 following diarrhea in the setting of GI surgery and colostomy reversal.  He does not want to take these drugs anymore.  However he tells me that he is feeling more short of breath in the last 4 months.  Present on exertion relieved by rest.  His symptom score reflect worsening shortness of breath.  He continues to have some amount of diarrhea because of reversal of his colostomy.  He had a high-resolution CT chest that shows stability in ILD per the radiologist since 2017.  His walking desaturation test is stable.  However his pulmonary function test shows confusing signal compared to 6 months or 1 year ago.  Is stable versus normal variation versus slight decline.  I personally visualized the images of the pulmonary function test and the grph with him.  He is open to getting his pulmonary pathology slides sent for third opinion at an outside institution.  We discussed antifibrotic's in case his fibrosis is getting worse.  He is definitely not interested in the other option of pirfenidone after hearing side effects of nausea, vomiting, weight loss, fatigue and  possible diarrhea.  He is interested in drugs that may not have GI side effects.  These are available in clinical trials.  For which she will need an IPF diagnosis.  Therefore is interested in getting another opinion on the slides to see if you would qualify for trials in the future.  His wife does indicate that he did have an episode of chest pain many went outdoors a few months ago and was climbing a hill.  In fact the chest pain was so bad it was central that he had to come back.  They have not reported this to the cardiologist.  He has cardiology follow-up coming up with Dr. Tresa Endo in May 2022.  He had normal stress test or low risk in 2017.  His echo was normal in May 2021.       IMPRESSION: HRCT  Feb 2022  COMPARISON:  07/30/2019,  04/02/2016   FINDINGS: Cardiovascular: Aortic atherosclerosis. Normal heart size. No pericardial effusion.   Mediastinum/Nodes: No enlarged mediastinal, hilar, or axillary lymph nodes. Thyroid gland, trachea, and esophagus demonstrate no significant findings.   Lungs/Pleura: Evidence of prior right lung wedge resections. No significant interval change in pattern of mild pulmonary fibrosis featuring irregular peripheral interstitial opacity and mild, tubular bronchiectasis at the lung basis without evidence of significant subpleural bronchiolectasis or honeycombing. No significant air trapping on expiratory phase imaging. No pleural effusion or pneumothorax.    1. No significant interval change in pattern of mild pulmonary fibrosis featuring irregular peripheral interstitial opacity and mild, tubular bronchiectasis at the lung basis without evidence of significant subpleural bronchiolectasis or honeycombing. Findings remain consistent with an "indeterminate for UIP" pattern of fibrosis by pulmonary fibrosis criteria. Findings are indeterminate for UIP per consensus guidelines: Diagnosis of Idiopathic Pulmonary Fibrosis: An Official ATS/ERS/JRS/ALAT  Clinical Practice Guideline. Am Rosezetta Schlatter Crit Care Med Vol 198, Iss 5, [phone], Jan 12 2017. 2. Evidence of prior right lung wedge resections.   Aortic Atherosclerosis (ICD10-I70.0).    According to the radiologist the pulmonary fibrosis is stable between 2017 through February 2022 on CT scan of the chest   Plan -We discussed about starting the other antifibrotic pirfenidone but given your history of GI side effects with nintedanib I respect your desire to refuse but continue with expectant follow-up  -We will send a message to Dr. Nicki Guadalajara to evaluate you for angina  -Once Dr. Tresa Endo clears you I think you should join pulmonary rehabilitation  -I have sent a note to our pathologist to get another opinion from another institution on your lung pathology to see if you have undifferentiated connective tissue disease or UIP/IPF.  If you have UIP/IPF you might qualify for clinical trials with drugs that do not have GI side effects  Follow-up -Do spirometry and DLCO in 3 months =     OV 02/09/2021  Subjective:  Patient ID: Tiffany Kocher, male , DOB: January 23, 1946 , age 70 y.o. , MRN: [id number] , ADDRESS: [address] [place] [address] PCP Benita Stabile, MD Patient Care Team: Benita Stabile, MD as PCP - General (Internal Medicine) Lennette Bihari, MD as PCP - Cardiology (Cardiology)  This Provider for this visit: Treatment Team:  Attending Provider: Kalman Shan, MD    02/09/2021 -   Chief Complaint  Patient presents with   Follow-up    PFT performed today.  Pt states he recently had back surgery 12/22/20. States his breathing has been about the same.   2  HPI Jarreau Diedrick Hotard 77 y.o. -follow-up unclassifiable ILD.  Last seen first in February 2022.  After that in July 2022 he saw a nurse practitioner for a preop clearance.  Since then he has had back fusion surgery.  He is slowly recovering from that.  He is here with his wife Okey Regal.  Dyspnea score  is stable.  He is not interested in antifibrotic's given previous side effects.  Last visit in February 2022 I contacted pathology to get another opinion from the [facility].  This is because the 2019 pathology revealed grade at [facility] said unclassifiable ILD but also indicated that there might be UIP.  I was not sure.  Still waiting on that interpretation in clinic.  I followed up with pathology about it nevertheless he is doing stable.  He will have a high-dose flu shot today.  He has never had COVID and wanted  to know if he should get the COVID by Valent mRNA booster.  I replied in the affirmative.  Currently is dealing a lot with back issues and slowly getting more mobile.  He and his wife wanting to travel to [place] to visit family.  They asked about travel advice in the era of COVID.     OV 10/31/2021  Subjective:  Patient ID: Tiffany Kocher, male , DOB: 1946-03-13 , age 20 y.o. , MRN: [id number] , ADDRESS: [address] [place] [address] PCP Benita Stabile, MD Patient Care Team: Benita Stabile, MD as PCP - General (Internal Medicine) Lennette Bihari, MD as PCP - Cardiology (Cardiology)  This Provider for this visit: Treatment Team:  Attending Provider: Kalman Shan, MD     10/31/2021 -   Chief Complaint  Patient presents with   Follow-up    Pt states that his breathing has become worse since last visit. Denies any complaints of cough.     HPI Neilson Melde Pennella 77 y.o. -returns for follow-up.  Last seen in September 2022.  He states our office never called back to make his 41-month follow-up.  He then had to call because he is now having insidious onset of worsening shortness of breath.  In December 2022 after his organ trip he picked up a respiratory viral infection with persistent cough.  Since then he has been more short of breath.  His symptom score is only slightly worse.  His walking desaturation test is stable but his pulmonary function test shows a  significant decline in FVC and DLCO.  Last CT scan of the chest was a year ago.  Last echo was a year ago.  He is worried about his decline.  In the past he has been intolerant to nintedanib we discussed the [facility] pathology results as unclassifiable ILD.  Explained to him IPF versus non-IPF.  Explained to him progressive phenotype and by unclassifiable ILD if it sent.   01/11/2022 Follow up : ILD  Patient returns 23-month follow-up.  Patient has underlying interstitial lung disease.  Last visit patient was started on Esbriet.  Patient says he tried 3 separate occasions to take Esbriet but was unable to tolerate due to severe GI symptoms and diarrhea.  He was set up for high-resolution CT chest that was done on January 09, 2022 that showed no significant interval change in his mild pulmonary fibrosis with a pattern apical to basilar gradient. 2D echo showed preserved EF, grade 1 diastolic function, right ventricular systolic function normal.  RV size is normal.  Labs were unrevealing.  BNP was normal. Patient says overall he is doing okay.  He tries to stay active with light chores around the house.  Patient denies any flare of cough or wheezing.  Has had no decrease in activity tolerance. Patient has PFTs planned for next month. Patient has tried Ofev in the past and was unable to tolerate due GI side effects. Patient says since last visit he is not worse or better he is about the same.  He gets winded with heavy activities.    OV 02/01/2022  Subjective:  Patient ID: Tiffany Kocher, male , DOB: Apr 28, 1946 , age 32 y.o. , MRN: [id number] , ADDRESS: [address] [place] [address] PCP Benita Stabile, MD Patient Care Team: Benita Stabile, MD as PCP - General (Internal Medicine) Lennette Bihari, MD as PCP - Cardiology (Cardiology)  This Provider for this visit: Treatment Team:  Attending Provider: Kalman Shan, MD   L 02/01/2022 -   Chief Complaint  Patient presents with    Follow-up    PFT performed today. Pt states he is still having problems with SOB.     HPI Nasyr Encarnacion Sherwood 77 y.o. -returns for follow-up.  Has gained a few more pounds of weight.  He did try pirfenidone but this caused him nausea and diarrhea and then in August 2020 he had COVID he possibly pirfenidone.  Once he recovered from COVID he tried it again it made him sick again to his stomach.  Therefore he stopped it.  He has no interest in doing antifibrotic's again.  He had repeat pulmonary function test and this shows that the pulmonary function test is slightly better but overall 2023 the pulmonary function test is worse compared to 2022 and before.  Nevertheless symptom wise he is stable.  We did do a high-res CT and shows continued stability of the ILD for many years.  Therefore he might not have progressive phenotype.  Even if he does at this point in time he is not tolerating any of the approved antifibrotic's.  He is tolerating a weeklong trip to [facility] to be with his daughter and son-in-law and grandchild and teach the grandchild fishing.  We discussed the ILD-Pro registry and if he qualifies he said he would be interested in it but his symptom scores remained stable and the CT scan has been stable.  Therefore he might not qualify.  He is willing to have the high-dose flu shot today.  He does not want have the COVID-vaccine because he believes he has natural immunity.  We did discuss RSV vaccine.        OV 06/05/2022  Subjective:  Patient ID: Tiffany Kocher, male , DOB: 10-02-1945 , age 50 y.o. , MRN: [id number] , ADDRESS: [address] [place] [address] PCP Benita Stabile, MD Patient Care Team: Benita Stabile, MD as PCP - General (Internal Medicine) Lennette Bihari, MD as PCP - Cardiology (Cardiology)  This Provider for this visit: Treatment Team:  Attending Provider: Kalman Shan, MD    06/05/2022 -   Chief Complaint  Patient presents with    Follow-up    Pt is here for ILD follow up and to get cleared for surgery. Surgery is pending. Right shoulder surgery. Pt did spiro/dlco today before visit. No pulmonary medications noted from patient      HPI Ghaith Kata Goering 77 y.o. -presents for follow-up.  Presents with his wife.  They plan to go back to [place] again in August 2024.  Wife is giving independent history here.  They both confirm that he is stable.  He feels the same in terms of his shortness of breath.  He has non-- IPF phenotype and he is on supportive care after having failed both pirfenidone and nintedanib because of side effects.  Even though he feels stable at this time he gave a high at symptom score for shortness of breath than in the past.  He had pulmonary function test that is definitely stable today January 2024 compared to September 2023 in June 2023.  However I believe this is consistent and definite transition point somewhere between late 2022 in the summer 2023.  The numbers are definitely worse than late 2022 and also worse compared to 2021.  I did show these numbers to him.  Did indicate to him that while CT scan of the chest last summer  2023 is stable compared to 2022 and his symptoms are relatively stable things could be changing and he might be having progressive phenotype.  Did indicate to him currently antifibrotic's are not an option because of intolerance.  I believe he is also on CellCept in the past. Neck step is to consider clinical trials.  We discussed clinical trials and he is open to the idea of inhaled treprostinil inhaled pirfenidone assuming we will be able to offer this  Vaccination: He has not had his RSV vaccine.  I did indicate to him the need for this.  He had COVID in August 2023.  Indicated 6 months of natural immunity and to consider vaccine after February 2024  Preoperative respiratory exam: He is having right shoulder surgery and.  It is not scheduled it.  The doctors Dr. Ramond Marrow.  A  45-minute surgery possibly under general anesthesia.  Currently functional.  I think he is low-moderate risk.    OV 03/28/2023  Subjective:  Patient ID: Tiffany Kocher, male , DOB: 1946/02/26 , age 77 y.o. , MRN: [id number] , ADDRESS: [address] [place] [address] PCP Benita Stabile, MD Patient Care Team: Benita Stabile, MD as PCP - General (Internal Medicine) Lennette Bihari, MD as PCP - Cardiology (Cardiology)  This Provider for this visit: Treatment Team:  Attending Provider: Kalman Shan, MD    03/28/2023 -   Chief Complaint  Patient presents with   Follow-up    PFT's done today. Breathing has been progressively worse- gets winded walking fast or walking up an incline.     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   - May clinic 3rd opinion 2022:  it is not IPF but they along with [facility] -place it as unclassfiable ILD.  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  - in 2019 : considered progressive pre-bx (on PFT/CT per notes)  - in 2019 - clinical CTD ruled out by Dr. 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 -presents for routine follow-up.  Presents with his wife.  Last seen in January 2024.  He tells me that he is got worsening shortness of breath.  Is actually been going on for 2 years.  Symptom score indicates that since his last visit the shortness of breath is stable but over the last 2 years he is gotten worse.  His pulmonary function test shows decline at least 5 or 6% on the FVC and 11% on the DLCO compared to the previous test earlier in the year.  He has had previous intolerance to approved antifibrotic's and CellCept.  We discussed inhaled pirfenidone trial.  He has already read the consent given to him by the research team a week or 2 ago.  He understands is a one third chance of placebo in a year later this rollover but he says he is not interested because it does not fit in with his lifestyle.  He says he likes to have surgery and is finding the travel requirements to be a little burdensome.  Wife encouraged him to think about this.  He is agreed to revisit this with his wife at home but also get a CT scan 3 months [last 1 was in spring 2023] at the time of follow-up.   Otherwise no hospitalizations no ER visits no surgeries.  Only current issue is that he is got some nausea and bloating which is a recurrence of a previous problem for the last 1 week and he is  got an appointment with primary care physician.  Also in June 2024 he did have COVID-19 external.  External record review indicates that he did see primary care in October 2024 for microcytosis fatigue and September 2024 for paroxysmal A-fib.  He continues  He continues to be hobbled by his left knee and hernia.  He plans to have surgery at some point in time in the future but not currently.      SYMPTOM SCALE - ILD 10/22/2019  12/30/2019  07/06/2020  02/09/2021 Supporitive care. S/p back surgery. 183# 10/31/2021  -> start esbriet 02/01/2022 -> off esbriet due to side effect 06/05/2022 r 03/28/2023   O2 use ra ra ra ra a ra ra ra  Shortness of Breath 0 -> 5 scale with 5 being worst (score 6 If unable to do)         At rest 1 1 0 1 1 1 1 1   Simple tasks - showers, clothes change, eating, shaving 3 1 2 1 1 1 2 2   Household (dishes, doing bed, laundry) 3 2 2 1 2 1 3  03  Shopping 3 1 2 1 2 2 3  03  Walking level at own pace 4 2 3 3 2 3 2 3   Walking up Stairs 5 4 5 5 5 5 5 17   Total (30-36) Dyspnea Score 19 11 13 12 14 13 17 20   How bad is your cough? 0 0 0 0 0 0 0 13  How bad is your fatigue yes 3 2 2 2 1 2  03  How bad is nausea yes 3 ofev 1 1 1 1 1  00  How bad is vomiting?  n 0 0 0 0 0 0 00  How bad is diarrhea? no 3 ofev 1 - baseline from GI surgery 0 00 2 3 0  How bad is anxiety? x 1 0 1 0 1 1 0  How bad is depression x 1 0 1 0 1 1 0     Simple office walk 185 feet x  3 laps goal with forehead probe 10/22/2019  07/06/2020  10/31/2021  02/01/2022  03/28/2023   O2 used ra ra ra ra ra  Number laps completed 3 3 3 3  Siut stand x 15  Comments about pace avg avg   Good pace  Resting Pulse Ox/HR 98% and 90/min 98% and HR 78 99% and HR 91 98% and HR 68 95% and HR 76  Final Pulse Ox/HR 96% and 107/min 97% and HR 95 96% and HR 108 97% and HR 84 93% and HR 97  Desaturated </= 88% no  nni    Desaturated <= 3% points no  no    Got Tachycardic >/= 90/min yes  Yes, 3 points No 1 point    Symptoms at end of test Mild dyspnea Mild dy spnea Mild dyspnea Mild duyspnea   Miscellaneous comments x         PFT     Latest Ref Rng & Units 03/28/2023  06/05/2022   10:05 AM 02/01/2022    8:57 AM 10/20/2021    4:09 PM 02/09/2021    1:43 PM 07/06/2020    8:47 AM 12/30/2019   10:15 AM 07/15/2019    8:59 AM  ILD indicators  FVC-Pre L 2.86 3.04  3.18  2.64  3.61  3.50  3.56  3.58   FVC-Predicted Pre %  76  80  66  90  86  88  88   FVC-Post L      3.46   3.60   FVC-Predicted Post %      85   88   TLC L      5.29   5.86   TLC Predicted %      79   88   DLCO uncorrected ml/min/mmHg 14.15 15.96  14.70  14.53  20.68  19.11  20.72  19.51   DLCO UNC %Pred %  67  61  61  86  79  86  80   DLCO Corrected ml/min/mmHg  15.96  14.70  14.53  22.11  19.11  20.55  19.51   DLCO COR %Pred %  67  61  61  92  79  85  80       Results for Norment, Tolbert SILER "SI" (MRN [id number]) as of 10/22/2019 16:07  Ref. Range 05/29/2016 10:05 10/10/2016 13:54 01/14/2018 09:50 01/14/2018 09:50 03/12/2018 11:24 6/10  Aspergillus flavus Unknown REPORT       Aspergillus Fumigatus Latest Ref Range: NEGATIVE     NEGATIVE    A fumigatus #1 Unknown REPORT       Aspergillus [place] Unknown REPORT       P[place]on Serum Latest Ref Range: NEGATIVE  REPORT   NEGATIVE    Apullulans Unknown REPORT       A-1 Antitrypsin, Ser Latest Ref Range: 83 - 199 mg/dL     621   Anti Nuclear Antibody (ANA) Latest Ref Range: NEGATIVE  NEG   NEGATIVE    CENTROMERE AB SCREEN Latest Ref Range: <1.0 NEG AI <1.0 NEG  <1.0 NEG     Cyclic Citrullin Peptide Ab Latest Units: UNITS <16   <16  < 16  RA Latex Turbid. Latest Ref Range: <14 IU/mL 24 (H)   38 (H)  32  B burgdorferi Ab IgG+IgM Latest Ref Range: <0.90 Index  <0.90      SSA (Ro) (ENA) Antibody, IgG Latest Ref Range: <1.0 NEG AI <1.0 NEG   <1.0 NEG    SSB (La) (ENA)  Antibody, IgG Latest Ref Range: <1.0 NEG AI <1.0 NEG   <1.0 NEG    Scleroderma (Scl-70) (ENA) Antibody, IgG Latest Ref Range: <1.0 NEG  AI <1.0 NEG  <1.0 NEG <1.0 NEG      LAB RESULTS last 96 hours No results found.  LAB RESULTS last 90 days No results found for this or any previous visit (from the past 2160 hour(s)).       has a past medical history of Allergy to alpha-gal, Arthritis, Chest pain, Complication of anesthesia, Difficult intubation (02/19/2018), Digestive problems, Dyspnea, Dysrhythmia, GERD (gastroesophageal reflux disease), H/O viral myocarditis, Head injury, closed, with concussion, Hyperlipidemia, Hypertension, Mild mitral valve prolapse, PAF (paroxysmal atrial fibrillation) (HCC), Polymyalgia rheumatica (HCC), PONV (postoperative nausea and vomiting), Pre-diabetes, Pulmonary fibrosis (HCC), and Sleep apnea.   reports that he has never smoked. He has never used smokeless tobacco.  Past Surgical History:  Procedure Laterality Date   ANTERIOR CERVICAL DECOMP/DISCECTOMY FUSION N/A 11/10/2019   Procedure: Cervical Three-Four Anterior cervical decompression/discectomy/fusion;  Surgeon: Barnett Abu, MD;  Location: [facility] OR;  Service: Neurosurgery;  Laterality: N/A;  Cervical Three-Four Anterior cervical decompression/discectomy/fusion   apendectomy  1965   APPENDECTOMY     APPLICATION OF ROBOTIC ASSISTANCE FOR SPINAL PROCEDURE N/A 12/22/2020   Procedure: APPLICATION OF ROBOTIC ASSISTANCE FOR SPINAL PROCEDURE;  Surgeon: Barnett Abu, MD;  Location: [facility] OR;  Service: Neurosurgery;  Laterality: N/A;   BACK SURGERY     bone spur Bilateral 1999   feet   CARDIAC CATHETERIZATION  2001   CHOLECYSTECTOMY  2004   COLON RESECTION N/A 06/15/2018   Procedure: SIGMOID COLON RESECTION;  Surgeon: Griselda Miner, MD;  Location: [facility] ORS;  Service: General;  Laterality: N/A;   COLONOSCOPY     COLOSTOMY N/A 06/15/2018   Procedure: COLOSTOMY;  Surgeon: Griselda Miner, MD;  Location: [facility] ORS;  Service: General;  Laterality: N/A;   COLOSTOMY TAKEDOWN N/A 01/26/2019   Procedure: LAPAROSCOPIC ASSISTED COLOSTOMY TAKEDOWN;  Surgeon: Griselda Miner, MD;  Location: [facility] OR;  Service: General;  Laterality: N/A;   EYE SURGERY Bilateral    cataract removal   LAPAROSCOPIC LYSIS OF ADHESIONS N/A 01/26/2019   Procedure: LAPAROSCOPIC LYSIS OF ADHESIONS;  Surgeon: Griselda Miner, MD;  Location: [facility] OR;  Service: General;  Laterality: N/A;   LAPAROTOMY N/A 06/15/2018   Procedure: EXPLORATORY LAPAROTOMY;  Surgeon: Griselda Miner, MD;  Location: [facility] ORS;  Service: General;  Laterality: N/A;   LUMBAR LAMINECTOMY/ DECOMPRESSION WITH MET-RX Right 05/05/2013   Procedure: Right Lumbar three-four Extraforaminal Microdiskectomy with Metrex;  Surgeon: Barnett Abu, MD;  Location: [facility];  Service: Neurosurgery;  Laterality: Right;  Right Lumbar three-four Extraforaminal Microdiskectomy with Metrex   LUNG BIOPSY Right 02/19/2018   Procedure: LUNG BIOPSY;  Surgeon: Loreli Slot, MD;  Location: [facility] OR;  Service: Thoracic;  Laterality: Right;   POSTERIOR LUMBAR FUSION 4 LEVEL N/A 12/22/2020   Procedure: Lumbar one-two Posterior lumbar interbody fusion with stabilization from Thoracic ten to Lumbar one with robotic screw placement;  Surgeon: Barnett Abu, MD;  Location: [facility] OR;  Service: Neurosurgery;  Laterality: N/A;  RM 20   REVERSE SHOULDER ARTHROPLASTY Right 07/11/2022   Procedure: REVERSE SHOULDER ARTHROPLASTY;  Surgeon: Bjorn Pippin, MD;  Location: [facility] ORS;  Service: Orthopedics;  Laterality: Right;   SHOULDER OPEN ROTATOR CUFF REPAIR Bilateral 2001   TONSILLECTOMY     VIDEO ASSISTED THORACOSCOPY Right 02/19/2018   Procedure: VIDEO ASSISTED THORACOSCOPY;  Surgeon: Loreli Slot,  MD;  Location: [facility] OR;  Service: Thoracic;  Laterality: Right;    Allergies  Allergen Reactions   Xylocaine [Lidocaine] Anaphylaxis    Injection form   Codeine Nausea And Vomiting   Statins Other (See Comments)    Muscle soreness and an aching feeling all over Myalgias   Aldactone [Spironolactone]     Sore nipples and chest   Alpha-Gal     2015; has  been able to re-introduce red meat for the past 2 1/2 years without reaction (as of 01/21/19)   Pirfenidone Other (See Comments)    GI side effects   Percocet [Oxycodone-Acetaminophen] Itching    Immunization History  Administered Date(s) Administered   Fluad Quad(high Dose 65+) 02/09/2021, 02/01/2022   Influenza Split 03/20/2016   Influenza, High Dose Seasonal PF 01/29/2018, 02/25/2019, 03/20/2019, 12/13/2019, 01/15/2023   Influenza,inj,Quad PF,6+ Mos 03/12/2017   Moderna Sars-Covid-2 Vaccination 06/04/2019, 07/10/2019, 03/18/2020, 02/22/2023   Pneumococcal Conjugate-13 01/10/2018   Rsv, Bivalent, Protein Subunit Rsvpref,pf Verdis Frederickson) 06/11/2022    Family History  Problem Relation Age of Onset   Rheum arthritis Mother    Thyroid cancer Mother    Heart disease Father    Asthma Father    Thyroid cancer Sister    Melanoma Brother      Current Outpatient Medications:    cetirizine (ZYRTEC) 10 MG tablet, Take 10 mg by mouth daily as needed for allergies. , Disp: , Rfl:    Cholecalciferol (DIALYVITE VITAMIN D 5000) 125 MCG (5000 UT) capsule, Take 5,000 Units by mouth daily., Disp: , Rfl:    diltiazem (CARDIZEM CD) 120 MG 24 hr capsule, TAKE ONE CAPSULE BY MOUTH DAILY, Disp: 90 capsule, Rfl: 3   diphenoxylate-atropine (LOMOTIL) 2.5-0.025 MG tablet, Take 1 tablet by mouth 4 (four) times daily as needed for diarrhea or loose stools., Disp: , Rfl:    ELIQUIS 5 MG TABS tablet, TAKE ONE TABLET (5MG  TOTAL) BY MOUTH TWODAILY (Patient taking differently: Take 5 mg by mouth 2 (two) times daily.), Disp: 180 tablet, Rfl: 1   ezetimibe (ZETIA) 10 MG tablet, TAKE ONE (1) TABLET BY MOUTH EVERY DAY, Disp: 90 tablet, Rfl: 3   fluticasone (FLONASE) 50 MCG/ACT nasal spray, Place 2 sprays into both nostrils daily as needed for allergies or rhinitis., Disp: , Rfl:    gabapentin (NEURONTIN) 300 MG capsule, Take 300 mg by mouth 2 (two) times daily., Disp: , Rfl:    hydroxychloroquine (PLAQUENIL) 200 MG tablet,  Take 200 mg by mouth 2 (two) times daily., Disp: , Rfl:    metoprolol tartrate (LOPRESSOR) 25 MG tablet, TAKE ONE TABLET BY MOUTH TWICE A DAY, Disp: 180 tablet, Rfl: 3   pantoprazole (PROTONIX) 40 MG tablet, Take 40 mg by mouth daily., Disp: , Rfl:    predniSONE (DELTASONE) 5 MG tablet, Take 5 mg by mouth daily. , Disp: , Rfl:    rosuvastatin (CRESTOR) 10 MG tablet, TAKE ONE-HALF TABLET (5MG  TOTAL) BY MOUTH ONCE A WEEK, Disp: 20 tablet, Rfl: 2   tamsulosin (FLOMAX) 0.4 MG CAPS capsule, Take 0.4 mg by mouth 2 (two) times daily. , Disp: , Rfl:    Trolamine Salicylate (ASPERCREME EX), Apply 1 application topically daily as needed (arthritis pain)., Disp: , Rfl:       Objective:   Vitals:   03/28/23 1539  BP: 108/60  Pulse: 87  SpO2: 95%  Weight: 187 lb (84.8 kg)  Height: 5\' 8"  (1.727 m)    Estimated body mass index is 28.43 kg/m as calculated from  the following:   Height as of this encounter: 5\' 8"  (1.727 m).   Weight as of this encounter: 187 lb (84.8 kg).  @WEIGHTCHANGE @  Filed Weights   03/28/23 1539  Weight: 187 lb (84.8 kg)     Physical Exam   General: No distress. Overweight. Looks stble O2 at rest: no Cane present: no Sitting in wheel chair: no Frail: no Obese: no Neuro: Alert and Oriented x 3. GCS 15. Speech normal Psych: Pleasant Resp:  Barrel Chest - no.  Wheeze - no, Crackles - YES mild base, No overt respiratory distress CVS: Normal heart sounds. Murmurs - no Ext: Stigmata of Connective Tissue Disease - no HEENT: Normal upper airway. PEERL +. No post nasal drip        Assessment:       ICD-10-CM   1. ILD (interstitial lung disease) (HCC)  J84.9 Pulmonary function test    CT Chest High Resolution    2. Shortness of breath  R06.02          Plan:     Patient Instructions  ILD (interstitial lung disease) (HCC) Dyspnea on exertion   -Clinically  you have NON-IPF variety of fibrosis -PFT and symptoms slowly worsening - You are currently on  supportive care due to pirfenidone and nintedanib and CellCept intolerance   Plan - HRCT in 3 months - discussed inhaled pirfenidone trials in detail  - defter due to lack of interest at this moment in time    Follow-up - -Do spirometry and DLCO in 6 months -Return to see Dr. Marchelle Gearing 6 months or sooner if needed - 15 min visit  - symtoms score and walk test at followup   FOLLOWUP Return in about 3 months (around 06/28/2023) for 15 min visit, ILD, with Dr Marchelle Gearing.  (Level 04 E&M 2024: Estb >= 30 min   visit type: on-site physical face to visit visit spent in total care time and counseling or/and coordination of care by this undersigned MD - Dr Kalman Shan. This includes one or more of the following on this same day 03/28/2023: pre-charting, chart review, note writing, documentation discussion of test results, diagnostic or treatment recommendations, prognosis, risks and benefits of management options, instructions, education, compliance or risk-factor reduction. It excludes time spent by the CMA or office staff in the care of the patient . Actual time is 30 min)   SIGNATURE    Dr. Kalman Shan, M.D., F.C.C.P,  Pulmonary and Critical Care Medicine Staff Physician, [facility] Director - Interstitial Lung Disease  Program  Pulmonary Fibrosis [facility] at [facility] [place], [place], [address]  Pager: [phone], If no answer or between  15:00h - 7:00h: call [phone] Telephone: [phone]  5:09 PM 03/28/2023

## 2023-03-28 NOTE — Progress Notes (Signed)
Spirometry/DLCO performed today. 

## 2023-03-28 NOTE — Patient Instructions (Addendum)
ILD (interstitial lung disease) (HCC) Dyspnea on exertion   -Clinically  you have NON-IPF variety of fibrosis -PFT and symptoms slowly worsening - You are currently on supportive care due to pirfenidone and nintedanib and CellCept intolerance   Plan - HRCT in 3 months - discussed inhaled pirfenidone trials in detail  - defter due to lack of interest at this moment in time    Follow-up - -Do spirometry and DLCO in 6 months -Return to see Dr. Marchelle Gearing 6 months or sooner if needed - 15 min visit  - symtoms score and walk test at followup

## 2023-03-29 DIAGNOSIS — I10 Essential (primary) hypertension: Secondary | ICD-10-CM | POA: Diagnosis not present

## 2023-03-29 DIAGNOSIS — R110 Nausea: Secondary | ICD-10-CM | POA: Diagnosis not present

## 2023-03-29 DIAGNOSIS — J849 Interstitial pulmonary disease, unspecified: Secondary | ICD-10-CM | POA: Diagnosis not present

## 2023-03-29 DIAGNOSIS — E778 Other disorders of glycoprotein metabolism: Secondary | ICD-10-CM | POA: Diagnosis not present

## 2023-03-29 DIAGNOSIS — N401 Enlarged prostate with lower urinary tract symptoms: Secondary | ICD-10-CM | POA: Diagnosis not present

## 2023-03-29 DIAGNOSIS — D6949 Other primary thrombocytopenia: Secondary | ICD-10-CM | POA: Diagnosis not present

## 2023-03-29 DIAGNOSIS — R1010 Upper abdominal pain, unspecified: Secondary | ICD-10-CM | POA: Diagnosis not present

## 2023-03-29 DIAGNOSIS — E8809 Other disorders of plasma-protein metabolism, not elsewhere classified: Secondary | ICD-10-CM | POA: Diagnosis not present

## 2023-03-29 DIAGNOSIS — D7589 Other specified diseases of blood and blood-forming organs: Secondary | ICD-10-CM | POA: Diagnosis not present

## 2023-03-29 DIAGNOSIS — R350 Frequency of micturition: Secondary | ICD-10-CM | POA: Diagnosis not present

## 2023-03-29 DIAGNOSIS — R109 Unspecified abdominal pain: Secondary | ICD-10-CM | POA: Diagnosis not present

## 2023-04-04 DIAGNOSIS — R110 Nausea: Secondary | ICD-10-CM | POA: Diagnosis not present

## 2023-04-04 DIAGNOSIS — R140 Abdominal distension (gaseous): Secondary | ICD-10-CM | POA: Diagnosis not present

## 2023-04-04 DIAGNOSIS — R109 Unspecified abdominal pain: Secondary | ICD-10-CM | POA: Diagnosis not present

## 2023-04-09 ENCOUNTER — Encounter (INDEPENDENT_AMBULATORY_CARE_PROVIDER_SITE_OTHER): Payer: Self-pay | Admitting: *Deleted

## 2023-04-15 ENCOUNTER — Other Ambulatory Visit: Payer: Self-pay | Admitting: Cardiovascular Disease

## 2023-04-16 ENCOUNTER — Telehealth: Payer: Self-pay | Admitting: *Deleted

## 2023-04-16 ENCOUNTER — Telehealth (INDEPENDENT_AMBULATORY_CARE_PROVIDER_SITE_OTHER): Payer: Self-pay | Admitting: Gastroenterology

## 2023-04-16 ENCOUNTER — Encounter (INDEPENDENT_AMBULATORY_CARE_PROVIDER_SITE_OTHER): Payer: Self-pay | Admitting: Gastroenterology

## 2023-04-16 ENCOUNTER — Ambulatory Visit (INDEPENDENT_AMBULATORY_CARE_PROVIDER_SITE_OTHER): Payer: PPO | Admitting: Gastroenterology

## 2023-04-16 ENCOUNTER — Ambulatory Visit: Payer: PPO | Attending: Nurse Practitioner | Admitting: Nurse Practitioner

## 2023-04-16 VITALS — BP 109/74 | HR 103 | Temp 97.9°F | Ht 68.0 in | Wt 185.7 lb

## 2023-04-16 DIAGNOSIS — R630 Anorexia: Secondary | ICD-10-CM | POA: Diagnosis not present

## 2023-04-16 DIAGNOSIS — Z8719 Personal history of other diseases of the digestive system: Secondary | ICD-10-CM | POA: Diagnosis not present

## 2023-04-16 DIAGNOSIS — R6881 Early satiety: Secondary | ICD-10-CM | POA: Insufficient documentation

## 2023-04-16 DIAGNOSIS — R140 Abdominal distension (gaseous): Secondary | ICD-10-CM | POA: Diagnosis not present

## 2023-04-16 DIAGNOSIS — R110 Nausea: Secondary | ICD-10-CM | POA: Diagnosis not present

## 2023-04-16 DIAGNOSIS — Z01810 Encounter for preprocedural cardiovascular examination: Secondary | ICD-10-CM | POA: Diagnosis not present

## 2023-04-16 NOTE — Telephone Encounter (Signed)
DR. Nicki Guadalajara IS PRIMARY CARD. Pt has been added on ok per Eligha Bridegroom, NP, see previous notes.   Med rec and consent are done.     Patient Consent for Virtual Visit        [name] has provided verbal consent on 04/16/2023 for a virtual visit (video or telephone).   CONSENT FOR VIRTUAL VISIT FOR:  [name]  By participating in this virtual visit I agree to the following:  I hereby voluntarily request, consent and authorize [facility] and its employed or contracted physicians, physician assistants, nurse practitioners or other licensed health care professionals (the Practitioner), to provide me with telemedicine health care services (the "Services") as deemed necessary by the treating Practitioner. I acknowledge and consent to receive the Services by the Practitioner via telemedicine. I understand that the telemedicine visit will involve communicating with the Practitioner through live audiovisual communication technology and the disclosure of certain medical information by electronic transmission. I acknowledge that I have been given the opportunity to request an in-person assessment or other available alternative prior to the telemedicine visit and am voluntarily participating in the telemedicine visit.  I understand that I have the right to withhold or withdraw my consent to the use of telemedicine in the course of my care at any time, without affecting my right to future care or treatment, and that the Practitioner or I may terminate the telemedicine visit at any time. I understand that I have the right to inspect all information obtained and/or recorded in the course of the telemedicine visit and may receive copies of available information for a reasonable fee.  I understand that some of the potential risks of receiving the Services via telemedicine include:  Delay or interruption in medical evaluation due to technological equipment failure or  disruption; Information transmitted may not be sufficient (e.g. poor resolution of images) to allow for appropriate medical decision making by the Practitioner; and/or  In rare instances, security protocols could fail, causing a breach of personal health information.  Furthermore, I acknowledge that it is my responsibility to provide information about my medical history, conditions and care that is complete and accurate to the best of my ability. I acknowledge that Practitioner's advice, recommendations, and/or decision may be based on factors not within their control, such as incomplete or inaccurate data provided by me or distortions of diagnostic images or specimens that may result from electronic transmissions. I understand that the practice of medicine is not an exact science and that Practitioner makes no warranties or guarantees regarding treatment outcomes. I acknowledge that a copy of this consent can be made available to me via my patient portal [facility] MyChart), or I can request a printed copy by calling the office of  [facility].    I understand that my insurance will be billed for this visit.   I have read or had this consent read to me. I understand the contents of this consent, which adequately explains the benefits and risks of the Services being provided via telemedicine.  I have been provided ample opportunity to ask questions regarding this consent and the Services and have had my questions answered to my satisfaction. I give my informed consent for the services to be provided through the use of telemedicine in my medical care

## 2023-04-16 NOTE — H&P (View-Only) (Signed)
Vista Lawman , M.D. Gastroenterology & Hepatology [facility] [address] [place], [place] [address] Primary Care Physician: Benita Stabile, MD [address] [name] [address]  Chief Complaint:  Nausea , abdominal distention, poor appetite  History of Present Illness:  [name] is a 77 y.o. male with Sigmoid colon perforation due to diveticulitis  s/p Hartmann colectomy/colostomy reversal 06/15/2018   hypertension, diabetes A-fib on Eliquis ,? Alpha -Gal,interstitial lung disease on prednisone, CellCept,  who presents for evaluation of Nausea , abdominal distention and poor appetite.  Patient is accompanied with wife today in clinic.  Patient reports that for past 20 to 30 days he has lost appetite where now he is only eating Jell-O and sauce.  Patient reports that there is no dysphagia but he does not have desire to eat .  Although denies any weight loss.  This is also accompanied with abdominal distention and persistent nausea.  Patient denies any vomiting.  Reports he has nausea since 2020 when he had perforated diverticulitis but has been worsened and not responding to Zofran and Reglan  Last XBJ:YNWG years ago at OSH Last Colonoscopy: Patient reports after colostomy reversal he had colonoscopy possibly at [facility] but he is not sure.  This may be in year 2022.  Does not have any records at this time  FHx: neg for any gastrointestinal/liver disease, no malignancies Social: neg smoking, alcohol or illicit drug use Surgical: Ex lap with colostomy for perforated diverticulitis  Most recent blood work from 03/2023 with hemoglobin 16.1 MCV 105 platelet 188 normal liver enzymes normal amylase and lipase levels  Patient had iron profile ferritin 364 iron saturation 32 Vitamin B12 borderline normal folate.  Normal homocysteine and MMA  Hemoglobin A1c 6.4 Past Medical History: Past Medical History:  Diagnosis Date    Allergy to alpha-gal    2015; has been able to re-introduce red meat for the past 2 1/2 years without reaction (as of 01/21/19)   Arthritis    Chest pain    a. 03/2017: echo showing EF of 60-65%, no regional WMA or significant valve abnormalities. b. 03/2016: NST with no evidence of ischemia.    Complication of anesthesia    "anaphylactic" reaction to Xylocaine > 10 years ago; reported later recieved marcaine without issue   Difficult intubation 02/19/2018   No difficulty 01/26/19 with Mac and 3 or 11/10/19 with Glidescope   Digestive problems    on Prednisone prn for this issue- diarrhea   Dyspnea    pulmonary fibrosis   Dysrhythmia    irregular due to Myocarditis- takes Atenolol, Norvasc   GERD (gastroesophageal reflux disease)    H/O viral myocarditis    25 years ago   Head injury, closed, with concussion    Breif LOC   Hyperlipidemia    Hypertension    Inguinal hernia    Mild mitral valve prolapse    per Dr Landry Dyke notes   PAF (paroxysmal atrial fibrillation) (HCC)    a. initially occuring in 02/2016. b. recurrent in 07/2016. Placed on Eliquis   Polymyalgia rheumatica (HCC)    PONV (postoperative nausea and vomiting)    Pre-diabetes    elevated due to high dose of prednsione for back no longer elevated   Pulmonary fibrosis (HCC)    Sleep apnea    no cpap mild    Past Surgical History: Past Surgical History:  Procedure Laterality Date   ANTERIOR CERVICAL DECOMP/DISCECTOMY FUSION N/A 11/10/2019  Procedure: Cervical Three-Four Anterior cervical decompression/discectomy/fusion;  Surgeon: Barnett Abu, MD;  Location: [facility] OR;  Service: Neurosurgery;  Laterality: N/A;  Cervical Three-Four Anterior cervical decompression/discectomy/fusion   apendectomy  1965   APPENDECTOMY     APPLICATION OF ROBOTIC ASSISTANCE FOR SPINAL PROCEDURE N/A 12/22/2020   Procedure: APPLICATION OF ROBOTIC ASSISTANCE FOR SPINAL PROCEDURE;  Surgeon: Barnett Abu, MD;  Location: [facility] OR;  Service:  Neurosurgery;  Laterality: N/A;   BACK SURGERY     bone spur Bilateral 1999   feet   CARDIAC CATHETERIZATION  2001   CHOLECYSTECTOMY  2004   COLON RESECTION N/A 06/15/2018   Procedure: SIGMOID COLON RESECTION;  Surgeon: Griselda Miner, MD;  Location: [facility] ORS;  Service: General;  Laterality: N/A;   COLONOSCOPY     COLOSTOMY N/A 06/15/2018   Procedure: COLOSTOMY;  Surgeon: Griselda Miner, MD;  Location: [facility] ORS;  Service: General;  Laterality: N/A;   COLOSTOMY TAKEDOWN N/A 01/26/2019   Procedure: LAPAROSCOPIC ASSISTED COLOSTOMY TAKEDOWN;  Surgeon: Griselda Miner, MD;  Location: [facility] OR;  Service: General;  Laterality: N/A;   DG GREAT TOE LEFT FOOT Bilateral    bone spurs   EYE SURGERY Bilateral    cataract removal   LAPAROSCOPIC LYSIS OF ADHESIONS N/A 01/26/2019   Procedure: LAPAROSCOPIC LYSIS OF ADHESIONS;  Surgeon: Griselda Miner, MD;  Location: [facility] OR;  Service: General;  Laterality: N/A;   LAPAROTOMY N/A 06/15/2018   Procedure: EXPLORATORY LAPAROTOMY;  Surgeon: Griselda Miner, MD;  Location: [facility] ORS;  Service: General;  Laterality: N/A;   LUMBAR LAMINECTOMY/ DECOMPRESSION WITH MET-RX Right 05/05/2013   Procedure: Right Lumbar three-four Extraforaminal Microdiskectomy with Metrex;  Surgeon: Barnett Abu, MD;  Location: [facility] NEURO ORS;  Service: Neurosurgery;  Laterality: Right;  Right Lumbar three-four Extraforaminal Microdiskectomy with Metrex   LUNG BIOPSY Right 02/19/2018   Procedure: LUNG BIOPSY;  Surgeon: Loreli Slot, MD;  Location: [facility] OR;  Service: Thoracic;  Laterality: Right;   POSTERIOR LUMBAR FUSION 4 LEVEL N/A 12/22/2020   Procedure: Lumbar one-two Posterior lumbar interbody fusion with stabilization from Thoracic ten to Lumbar one with robotic screw placement;  Surgeon: Barnett Abu, MD;  Location: [facility] OR;  Service: Neurosurgery;  Laterality: N/A;  RM 20   REVERSE SHOULDER ARTHROPLASTY Right 07/11/2022   Procedure: REVERSE SHOULDER ARTHROPLASTY;  Surgeon: Bjorn Pippin, MD;   Location: [facility] ORS;  Service: Orthopedics;  Laterality: Right;   SHOULDER OPEN ROTATOR CUFF REPAIR Bilateral 2001   TONSILLECTOMY     VIDEO ASSISTED THORACOSCOPY Right 02/19/2018   Procedure: VIDEO ASSISTED THORACOSCOPY;  Surgeon: Loreli Slot, MD;  Location: [facility] OR;  Service: Thoracic;  Laterality: Right;    Family History: Family History  Problem Relation Age of Onset   Rheum arthritis Mother    Thyroid cancer Mother    Heart disease Father    Asthma Father    Thyroid cancer Sister    Melanoma Brother     Social History: Social History   Tobacco Use  Smoking Status Never  Smokeless Tobacco Never   Social History   Substance and Sexual Activity  Alcohol Use Yes   Alcohol/week: 21.0 standard drinks of alcohol   Types: 21 Glasses of wine per week   Comment: 2/3 glasses wine in evening   Social History   Substance and Sexual Activity  Drug Use No    Allergies: Allergies  Allergen Reactions   Xylocaine [Lidocaine] Anaphylaxis    Injection form   Codeine Nausea And  Vomiting   Statins Other (See Comments)    Muscle soreness and an aching feeling all over Myalgias   Aldactone [Spironolactone]     Sore nipples and chest   Alpha-Gal     2015; has been able to re-introduce red meat for the past 2 1/2 years without reaction (as of 01/21/19)   Pirfenidone Other (See Comments)    GI side effects   Percocet [Oxycodone-Acetaminophen] Itching    Medications: Current Outpatient Medications  Medication Sig Dispense Refill   acetaminophen (ACETAMINOPHEN 8 HOUR) 650 MG CR tablet Take 650 mg by mouth every 8 (eight) hours as needed for pain.     cetirizine (ZYRTEC) 10 MG tablet Take 10 mg by mouth daily as needed for allergies.      Cholecalciferol (DIALYVITE VITAMIN D 5000) 125 MCG (5000 UT) capsule Take 5,000 Units by mouth daily.     diltiazem (CARDIZEM CD) 120 MG 24 hr capsule TAKE ONE CAPSULE BY MOUTH DAILY 90 capsule 3   diphenoxylate-atropine (LOMOTIL)  2.5-0.025 MG tablet Take 1 tablet by mouth 4 (four) times daily as needed for diarrhea or loose stools.     ELIQUIS 5 MG TABS tablet TAKE ONE TABLET (5MG  TOTAL) BY MOUTH TWODAILY (Patient taking differently: Take 5 mg by mouth 2 (two) times daily.) 180 tablet 1   ezetimibe (ZETIA) 10 MG tablet TAKE ONE (1) TABLET BY MOUTH EVERY DAY 90 tablet 3   fluticasone (FLONASE) 50 MCG/ACT nasal spray Place 2 sprays into both nostrils daily as needed for allergies or rhinitis.     hydroxychloroquine (PLAQUENIL) 200 MG tablet Take 200 mg by mouth 2 (two) times daily.     metoprolol tartrate (LOPRESSOR) 25 MG tablet TAKE ONE TABLET BY MOUTH TWICE A DAY 180 tablet 3   ondansetron (ZOFRAN) 4 MG tablet Take 4 mg by mouth every 8 (eight) hours as needed for nausea or vomiting.     pantoprazole (PROTONIX) 40 MG tablet Take 40 mg by mouth daily.     predniSONE (DELTASONE) 5 MG tablet Take 5 mg by mouth daily.      rosuvastatin (CRESTOR) 10 MG tablet Take 0.5 tablets (5 mg total) by mouth once a week. Pt needs to make appt with provider for further refills - 1st attempt 10 tablet 0   tamsulosin (FLOMAX) 0.4 MG CAPS capsule Take 0.4 mg by mouth 2 (two) times daily.      Vibegron (GEMTESA) 75 MG TABS Take by mouth daily.     No current facility-administered medications for this visit.    Review of Systems: GENERAL: negative for malaise, night sweats HEENT: No changes in hearing or vision, no nose bleeds or other nasal problems. NECK: Negative for lumps, goiter, pain and significant neck swelling RESPIRATORY: Negative for cough, wheezing CARDIOVASCULAR: Negative for chest pain, leg swelling, palpitations, orthopnea GI: SEE HPI MUSCULOSKELETAL: Negative for joint pain or swelling, back pain, and muscle pain. SKIN: Negative for lesions, rash HEMATOLOGY Negative for prolonged bleeding, bruising easily, and swollen nodes. ENDOCRINE: Negative for cold or heat intolerance, polyuria, polydipsia and goiter. NEURO:  negative for tremor, gait imbalance, syncope and seizures. The remainder of the review of systems is noncontributory.   Physical Exam: BP 109/74 (BP Location: Left Arm, Patient Position: Sitting, Cuff Size: Large)   Pulse (!) 103   Temp 97.9 F (36.6 C) (Oral)   Ht 5\' 8"  (1.727 m)   Wt 185 lb 11.2 oz (84.2 kg)   BMI 28.24 kg/m  GENERAL: The patient is  AO x3, in no acute distress. HEENT: Head is normocephalic and atraumatic. EOMI are intact. Mouth is well hydrated and without lesions. NECK: Supple. No masses LUNGS: Clear to auscultation. No presence of rhonchi/wheezing/rales. Adequate chest expansion HEART: RRR, normal s1 and s2. ABDOMEN: Soft, nontender, no guarding, no peritoneal signs, and nondistended. BS +. No masses.  Imaging/Labs: as above     Latest Ref Rng & Units 06/28/2022   11:58 AM 10/31/2021   10:04 AM 12/23/2020    4:29 AM  CBC  WBC 4.0 - 10.5 K/uL 10.3  8.4  8.6   Hemoglobin 13.0 - 17.0 g/dL 21.3  08.6  57.8   Hematocrit 39.0 - 52.0 % 46.7  48.0  35.9   Platelets 150 - 400 K/uL 219  200.0  150    No results found for: "IRON", "TIBC", "FERRITIN"  I personally reviewed and interpreted the available labs, imaging and endoscopic files.  Impression and Plan:  [name] is a 77 y.o. male with Sigmoid colon perforation due to diveticulitis  s/p Hartmann colectomy/colostomy reversal 06/15/2018   hypertension, diabetes A-fib on Eliquis ,? Alpha -Gal,interstitial lung disease on prednisone, CellCept,  who presents for evaluation of Nausea , abdominal distention and poor appetite.  #Loss of Appetite #Abdominal distention  #Nausea  Patient has sudden onset of symptoms which are concerning in anyone above the age of 40 as he has new upper GI symptoms.  At this time it is imperative to rule out malignancy  As per ACG guidelines patient above the age of 59 with new upper GI symptoms upper endoscopy is indicated.  Will proceed on urgent basis  Also patient seem  to have borderline vitamin B12 deficiency, hence while performing upper endoscopy will plan for mapping biopsies to rule out for any underlying dysplasia as well  Given loss of appetite and abdominal distention with constant nausea will evaluate other organs such as pancreas and liver with cross-sectional imaging to rule out malignancy.  Nausea is not responsive to Zofran and Reglan .will obtain CT abdomen pelvis with IV contrast on urgent basis  #History of Perforated Sigmoid diverticulitis  Patient reports after colostomy reversal he had colonoscopy possibly at [facility] but he is not sure.  This may be in year 2022.  Does not have any records at this time  Will obtain records from [facility] to assess when this patient due for colonoscopy  Vista Lawman, MD Gastroenterology and Hepatology [facility] Gastroenterology   This chart has been completed using [facility] Dictation software, and while attempts have been made to ensure accuracy , certain words and phrases may not be transcribed as intended

## 2023-04-16 NOTE — Telephone Encounter (Signed)
DR. Nicki Guadalajara IS PRIMARY CARD. Pt has been added on ok per Eligha Bridegroom, NP, see previous notes.   Med rec and consent are done.

## 2023-04-16 NOTE — Progress Notes (Signed)
Virtual Visit via Telephone Note   Because of [name]'s co-morbid illnesses, he is at least at moderate risk for complications without adequate follow up.  This format is felt to be most appropriate for this patient at this time.  The patient did not have access to video technology/had technical difficulties with video requiring transitioning to audio format only (telephone).  All issues noted in this document were discussed and addressed.  No physical exam could be performed with this format.  Please refer to the patient's chart for his consent to telehealth for [facility].  Evaluation Performed:  Preoperative cardiovascular risk assessment _____________   Date:  04/16/2023   Patient ID:  [name], DOB 06/05/1945, MRN [id number] Patient Location:  Home Provider location:   Office  Primary Care Provider:  Benita Stabile, MD Primary Cardiologist:  Nicki Guadalajara, MD  Chief Complaint / Patient Profile   77 y.o. y/o male with a h/o mild coronary artery disease with midsystolic LAD bridging, PAF on chronic anticoagulation, bilateral lower extremity edema, fibrosing nonspecific interstitial pneumonia mitis, viral myocarditis with EF 25% in 1989 which later normalized, hyperlipidemia, OSA, and alpha gal who is pending EGD with Dr. Tasia Catchings on 04/18/23 and presents today for telephonic preoperative cardiovascular risk assessment.  History of Present Illness    [name] is a 77 y.o. male who presents via audio/video conferencing for a telehealth visit today.  Pt was last seen in cardiology clinic on 05/28/2022 by Bernadene Person, NP.  At that time [name] was doing well.  The patient is now pending procedure as outlined above. Since his last visit, he  denies chest pain, lower extremity edema, fatigue, palpitations, weakness, presyncope, syncope, orthopnea, and PND. He has chronic dyspnea and is limited by pulmonary fibrosis but is able to achieve  > 4 METS activity without significant cardiac symptoms or worsening respiratory symptoms.   Past Medical History    Past Medical History:  Diagnosis Date   Allergy to alpha-gal    2015; has been able to re-introduce red meat for the past 2 1/2 years without reaction (as of 01/21/19)   Arthritis    Chest pain    a. 03/2017: echo showing EF of 60-65%, no regional WMA or significant valve abnormalities. b. 03/2016: NST with no evidence of ischemia.    Complication of anesthesia    "anaphylactic" reaction to Xylocaine > 10 years ago; reported later recieved marcaine without issue   Difficult intubation 02/19/2018   No difficulty 01/26/19 with Mac and 3 or 11/10/19 with Glidescope   Digestive problems    on Prednisone prn for this issue- diarrhea   Dyspnea    pulmonary fibrosis   Dysrhythmia    irregular due to Myocarditis- takes Atenolol, Norvasc   GERD (gastroesophageal reflux disease)    H/O viral myocarditis    25 years ago   Head injury, closed, with concussion    Breif LOC   Hyperlipidemia    Hypertension    Inguinal hernia    Mild mitral valve prolapse    per Dr Landry Dyke notes   PAF (paroxysmal atrial fibrillation) (HCC)    a. initially occuring in 02/2016. b. recurrent in 07/2016. Placed on Eliquis   Polymyalgia rheumatica (HCC)    PONV (postoperative nausea and vomiting)    Pre-diabetes    elevated due to high dose of prednsione for back no longer elevated   Pulmonary fibrosis (HCC)    Sleep apnea  no cpap mild   Past Surgical History:  Procedure Laterality Date   ANTERIOR CERVICAL DECOMP/DISCECTOMY FUSION N/A 11/10/2019   Procedure: Cervical Three-Four Anterior cervical decompression/discectomy/fusion;  Surgeon: Barnett Abu, MD;  Location: [facility] OR;  Service: Neurosurgery;  Laterality: N/A;  Cervical Three-Four Anterior cervical decompression/discectomy/fusion   apendectomy  1965   APPENDECTOMY     APPLICATION OF ROBOTIC ASSISTANCE FOR SPINAL PROCEDURE N/A 12/22/2020    Procedure: APPLICATION OF ROBOTIC ASSISTANCE FOR SPINAL PROCEDURE;  Surgeon: Barnett Abu, MD;  [facility] OR;  Service: Neurosurgery;  Laterality: N/A;   BACK SURGERY     bone spur Bilateral 1999   feet   CARDIAC CATHETERIZATION  2001   CHOLECYSTECTOMY  2004   COLON RESECTION N/A 06/15/2018   Procedure: SIGMOID COLON RESECTION;  Surgeon: Griselda Miner, MD;  Location: [facility] ORS;  Service: General;  Laterality: N/A;   COLONOSCOPY     COLOSTOMY N/A 06/15/2018   Procedure: COLOSTOMY;  Surgeon: Griselda Miner, MD;  Location: [facility] ORS;  Service: General;  Laterality: N/A;   COLOSTOMY TAKEDOWN N/A 01/26/2019   Procedure: LAPAROSCOPIC ASSISTED COLOSTOMY TAKEDOWN;  Surgeon: Griselda Miner, MD;  Location: [facility] OR;  Service: General;  Laterality: N/A;   DG GREAT TOE LEFT FOOT Bilateral    bone spurs   EYE SURGERY Bilateral    cataract removal   LAPAROSCOPIC LYSIS OF ADHESIONS N/A 01/26/2019   Procedure: LAPAROSCOPIC LYSIS OF ADHESIONS;  Surgeon: Griselda Miner, MD;  [facility]: [facility] OR;  Service: General;  Laterality: N/A;   LAPAROTOMY N/A 06/15/2018   Procedure: EXPLORATORY LAPAROTOMY;  Surgeon: Griselda Miner, MD;  Location: [facility];  Service: General;  Laterality: N/A;   LUMBAR LAMINECTOMY/ DECOMPRESSION WITH MET-RX Right 05/05/2013   Procedure: Right Lumbar three-four Extraforaminal Microdiskectomy with Metrex;  Surgeon: Barnett Abu, MD;  [facility];  Service: Neurosurgery;  Laterality: Right;  Right Lumbar three-four Extraforaminal Microdiskectomy with Metrex   LUNG BIOPSY Right 02/19/2018   Procedure: LUNG BIOPSY;  Surgeon: Loreli Slot, MD;  Location: [facility] OR;  Service: Thoracic;  Laterality: Right;   POSTERIOR LUMBAR FUSION 4 LEVEL N/A 12/22/2020   Procedure: Lumbar one-two Posterior lumbar interbody fusion with stabilization from Thoracic ten to Lumbar one with robotic screw placement;  Surgeon: Barnett Abu, MD;  Location: [facility] OR;  Service: Neurosurgery;  Laterality: N/A;  RM  20   REVERSE SHOULDER ARTHROPLASTY Right 07/11/2022   Procedure: REVERSE SHOULDER ARTHROPLASTY;  Surgeon: Bjorn Pippin, MD;  Location: [facility]S;  Service: Orthopedics;  Laterality: Right;   SHOULDER OPEN ROTATOR CUFF REPAIR Bilateral 2001   TONSILLECTOMY     VIDEO ASSISTED THORACOSCOPY Right 02/19/2018   Procedure: VIDEO ASSISTED THORACOSCOPY;  Surgeon: Loreli Slot, MD;  Location: [facility] OR;  Service: Thoracic;  Laterality: Right;    Allergies  Allergies  Allergen Reactions   Xylocaine [Lidocaine] Anaphylaxis    Injection form   Codeine Nausea And Vomiting   Statins Other (See Comments)    Muscle soreness and an aching feeling all over Myalgias   Aldactone [Spironolactone]     Sore nipples and chest   Alpha-Gal     2015; has been able to re-introduce red meat for the past 2 1/2 years without reaction (as of 01/21/19)   Pirfenidone Other (See Comments)    GI side effects   Percocet [Oxycodone-Acetaminophen] Itching    Home Medications    Prior to Admission medications   Medication Sig Start Date End Date Taking? Authorizing  Provider  acetaminophen (ACETAMINOPHEN 8 HOUR) 650 MG CR tablet Take 650 mg by mouth every 8 (eight) hours as needed for pain.    [provider]  cetirizine (ZYRTEC) 10 MG tablet Take 10 mg by mouth daily as needed for allergies.     [provider]  Cholecalciferol (DIALYVITE VITAMIN D 5000) 125 MCG (5000 UT) capsule Take 5,000 Units by mouth daily.    [provider]  diltiazem (CARDIZEM CD) 120 MG 24 hr capsule TAKE ONE CAPSULE BY MOUTH DAILY 06/11/22   Lennette Bihari, MD  diphenoxylate-atropine (LOMOTIL) 2.5-0.025 MG tablet Take 1 tablet by mouth 4 (four) times daily as needed for diarrhea or loose stools.    [provider]  ELIQUIS 5 MG TABS tablet TAKE ONE TABLET (5MG  TOTAL) BY MOUTH TWODAILY Patient taking differently: Take 5 mg by mouth 2 (two) times daily. 01/28/18   Lennette Bihari, MD  ezetimibe (ZETIA) 10 MG  tablet TAKE ONE (1) TABLET BY MOUTH EVERY DAY 07/13/22   Lennette Bihari, MD  fluticasone [facility]) 50 MCG/ACT nasal spray Place 2 sprays into both nostrils daily as needed for allergies or rhinitis.    [provider]  hydroxychloroquine (PLAQUENIL) 200 MG tablet Take 200 mg by mouth 2 (two) times daily. 09/22/20   [provider]  metoprolol tartrate (LOPRESSOR) 25 MG tablet TAKE ONE TABLET BY MOUTH TWICE A DAY 02/25/18   Lennette Bihari, MD  ondansetron (ZOFRAN) 4 MG tablet Take 4 mg by mouth every 8 (eight) hours as needed for nausea or vomiting.    [provider]  pantoprazole (PROTONIX) 40 MG tablet Take 40 mg by mouth daily. 01/01/22   [provider]  predniSONE (DELTASONE) 5 MG tablet Take 5 mg by mouth daily.     [provider]  rosuvastatin (CRESTOR) 10 MG tablet Take 0.5 tablets (5 mg total) by mouth once a week. Pt needs to make appt with provider for further refills - 1st attempt 04/15/23   Lennette Bihari, MD  tamsulosin (FLOMAX) 0.4 MG CAPS capsule Take 0.4 mg by mouth 2 (two) times daily.     [provider]  Vibegron (GEMTESA) 75 MG TABS Take by mouth daily.    [provider]    Physical Exam    Vital Signs:  Reeder Proa Corpus does not have vital signs available for review today.  Given telephonic nature of communication, physical exam is limited. AAOx3. NAD. Normal affect.  Speech and respirations are unlabored.  Accessory Clinical Findings    None  Assessment & Plan    1.  Preoperative Cardiovascular Risk Assessment: According to the Revised Cardiac Risk Index (RCRI), his Perioperative Risk of Major Cardiac Event is (%): 0.4. His Functional Capacity in METs is: 6.02 according to the Duke Activity Status Index (DASI). The patient is doing well from a cardiac perspective. Therefore, based on ACC/AHA guidelines, the patient would be at acceptable risk for the planned procedure without further cardiovascular  testing.    The patient was advised that if he develops new symptoms prior to surgery to contact our office to arrange for a follow-up visit, and he verbalized understanding.  Per office protocol, patient can hold Eliquis for 2 days prior to procedure.   Patient will not need bridging with Lovenox (enoxaparin) around procedure.  A copy of this note will be routed to requesting surgeon.  Time:   Today, I have spent 10 minutes with the patient with telehealth technology  discussing medical history, symptoms, and management plan.     Levi Aland, NP-C  04/16/2023, 3:48 PM [address], [address] Office [phone] Fax [phone]

## 2023-04-16 NOTE — Progress Notes (Signed)
[name] , M.D. Gastroenterology & Hepatology [facility] Gastroenterology [address] [place], [place] [address] Primary Care Physician: Randall Stabile, MD [address] [name] [address]  Chief Complaint:  Nausea , abdominal distention, poor appetite  History of Present Illness:  [name] is a 77 y.o. male with Sigmoid colon perforation due to diveticulitis  s/p Hartmann colectomy/colostomy reversal 06/15/2018   hypertension, diabetes A-fib on Eliquis ,? Alpha -Gal,interstitial lung disease on prednisone, CellCept,  who presents for evaluation of Nausea , abdominal distention and poor appetite.  Patient is accompanied with wife today in clinic.  Patient reports that for past 20 to 30 days he has lost appetite where now he is only eating Jell-O and sauce.  Patient reports that there is no dysphagia but he does not have desire to eat .  Although denies any weight loss.  This is also accompanied with abdominal distention and persistent nausea.  Patient denies any vomiting.  Reports he has nausea since 2020 when he had perforated diverticulitis but has been worsened and not responding to Zofran and Reglan  Last XBJ:YNWG years ago at OSH Last Colonoscopy: Patient reports after colostomy reversal he had colonoscopy possibly at [facility] but he is not sure.  This may be in year 2022.  Does not have any records at this time  FHx: neg for any gastrointestinal/liver disease, no malignancies Social: neg smoking, alcohol or illicit drug use Surgical: Ex lap with colostomy for perforated diverticulitis  Most recent blood work from 03/2023 with hemoglobin 16.1 MCV 105 platelet 188 normal liver enzymes normal amylase and lipase levels  Patient had iron profile ferritin 364 iron saturation 32 Vitamin B12 borderline normal folate.  Normal homocysteine and MMA  Hemoglobin A1c 6.4 Past Medical History: Past Medical History:  Diagnosis Date    Allergy to alpha-gal    2015; has been able to re-introduce red meat for the past 2 1/2 years without reaction (as of 01/21/19)   Arthritis    Chest pain    a. 03/2017: echo showing EF of 60-65%, no regional WMA or significant valve abnormalities. b. 03/2016: NST with no evidence of ischemia.    Complication of anesthesia    "anaphylactic" reaction to Xylocaine > 10 years ago; reported later recieved marcaine without issue   Difficult intubation 02/19/2018   No difficulty 01/26/19 with Mac and 3 or 11/10/19 with Glidescope   Digestive problems    on Prednisone prn for this issue- diarrhea   Dyspnea    pulmonary fibrosis   Dysrhythmia    irregular due to Myocarditis- takes Atenolol, Norvasc   GERD (gastroesophageal reflux disease)    H/O viral myocarditis    25 years ago   Head injury, closed, with concussion    Breif LOC   Hyperlipidemia    Hypertension    Inguinal hernia    Mild mitral valve prolapse    per [name] notes   PAF (paroxysmal atrial fibrillation) (HCC)    a. initially occuring in 02/2016. b. recurrent in 07/2016. Placed on Eliquis   Polymyalgia rheumatica (HCC)    PONV (postoperative nausea and vomiting)    Pre-diabetes    elevated due to high dose of prednsione for back no longer elevated   Pulmonary fibrosis (HCC)    Sleep apnea    no cpap mild    Past Surgical History: Past Surgical History:  Procedure Laterality Date   ANTERIOR CERVICAL DECOMP/DISCECTOMY FUSION N/A 11/10/2019  Procedure: Cervical Three-Four Anterior cervical decompression/discectomy/fusion;  Surgeon: Randall Abu, MD;  Location: [facility] OR;  Service: Neurosurgery;  Laterality: N/A;  Cervical Three-Four Anterior cervical decompression/discectomy/fusion   apendectomy  1965   APPENDECTOMY     APPLICATION OF ROBOTIC ASSISTANCE FOR SPINAL PROCEDURE N/A 12/22/2020   Procedure: APPLICATION OF ROBOTIC ASSISTANCE FOR SPINAL PROCEDURE;  Surgeon: Randall Abu, MD;  Location: [facility] OR;  Service:  Neurosurgery;  Laterality: N/A;   BACK SURGERY     bone spur Bilateral 1999   feet   CARDIAC CATHETERIZATION  2001   CHOLECYSTECTOMY  2004   COLON RESECTION N/A 06/15/2018   Procedure: SIGMOID COLON RESECTION;  Surgeon: Randall Miner, MD;  Location: [facility] ORS;  Service: General;  Laterality: N/A;   COLONOSCOPY     COLOSTOMY N/A 06/15/2018   Procedure: COLOSTOMY;  Surgeon: Randall Miner, MD;  Location: [facility];  Service: General;  Laterality: N/A;   COLOSTOMY TAKEDOWN N/A 01/26/2019   Procedure: LAPAROSCOPIC ASSISTED COLOSTOMY TAKEDOWN;  Surgeon: Randall Miner, MD;  Location: [facility] OR;  Service: General;  Laterality: N/A;   DG GREAT TOE LEFT FOOT Bilateral    bone spurs   EYE SURGERY Bilateral    cataract removal   LAPAROSCOPIC LYSIS OF ADHESIONS N/A 01/26/2019   Procedure: LAPAROSCOPIC LYSIS OF ADHESIONS;  Surgeon: Randall Miner, MD;  Location: [facility] OR;  Service: General;  Laterality: N/A;   LAPAROTOMY N/A 06/15/2018   Procedure: EXPLORATORY LAPAROTOMY;  Surgeon: Randall Miner, MD;  Location: [facility];  Service: General;  Laterality: N/A;   LUMBAR LAMINECTOMY/ DECOMPRESSION WITH MET-RX Right 05/05/2013   Procedure: Right Lumbar three-four Extraforaminal Microdiskectomy with Metrex;  Surgeon: Randall Abu, MD;  [facility];  Service: Neurosurgery;  Laterality: Right;  Right Lumbar three-four Extraforaminal Microdiskectomy with Metrex   LUNG BIOPSY Right 02/19/2018   Procedure: LUNG BIOPSY;  Surgeon: Randall Slot, MD;  Location: [facility] OR;  Service: Thoracic;  Laterality: Right;   POSTERIOR LUMBAR FUSION 4 LEVEL N/A 12/22/2020   Procedure: Lumbar one-two Posterior lumbar interbody fusion with stabilization from Thoracic ten to Lumbar one with robotic screw placement;  Surgeon: Randall Abu, MD;  Location: [facility] OR;  Service: Neurosurgery;  Laterality: N/A;  RM 20   REVERSE SHOULDER ARTHROPLASTY Right 07/11/2022   Procedure: REVERSE SHOULDER ARTHROPLASTY;  Surgeon: Randall Pippin, MD;   Location: [facility];  Service: Orthopedics;  Laterality: Right;   SHOULDER OPEN ROTATOR CUFF REPAIR Bilateral 2001   TONSILLECTOMY     VIDEO ASSISTED THORACOSCOPY Right 02/19/2018   Procedure: VIDEO ASSISTED THORACOSCOPY;  Surgeon: Randall Slot, MD;  Location: [facility] OR;  Service: Thoracic;  Laterality: Right;    Family History: Family History  Problem Relation Age of Onset   Rheum arthritis Mother    Thyroid cancer Mother    Heart disease Father    Asthma Father    Thyroid cancer Sister    Melanoma Brother     Social History: Social History   Tobacco Use  Smoking Status Never  Smokeless Tobacco Never   Social History   Substance and Sexual Activity  Alcohol Use Yes   Alcohol/week: 21.0 standard drinks of alcohol   Types: 21 Glasses of wine per week   Comment: 2/3 glasses wine in evening   Social History   Substance and Sexual Activity  Drug Use No    Allergies: Allergies  Allergen Reactions   Xylocaine [Lidocaine] Anaphylaxis    Injection form   Codeine Nausea And  Vomiting   Statins Other (See Comments)    Muscle soreness and an aching feeling all over Myalgias   Aldactone [Spironolactone]     Sore nipples and chest   Alpha-Gal     2015; has been able to re-introduce red meat for the past 2 1/2 years without reaction (as of 01/21/19)   Pirfenidone Other (See Comments)    GI side effects   Percocet [Oxycodone-Acetaminophen] Itching    Medications: Current Outpatient Medications  Medication Sig Dispense Refill   acetaminophen (ACETAMINOPHEN 8 HOUR) 650 MG CR tablet Take 650 mg by mouth every 8 (eight) hours as needed for pain.     cetirizine (ZYRTEC) 10 MG tablet Take 10 mg by mouth daily as needed for allergies.      Cholecalciferol (DIALYVITE VITAMIN D 5000) 125 MCG (5000 UT) capsule Take 5,000 Units by mouth daily.     diltiazem (CARDIZEM CD) 120 MG 24 hr capsule TAKE ONE CAPSULE BY MOUTH DAILY 90 capsule 3   diphenoxylate-atropine (LOMOTIL)  2.5-0.025 MG tablet Take 1 tablet by mouth 4 (four) times daily as needed for diarrhea or loose stools.     ELIQUIS 5 MG TABS tablet TAKE ONE TABLET (5MG  TOTAL) BY MOUTH TWODAILY (Patient taking differently: Take 5 mg by mouth 2 (two) times daily.) 180 tablet 1   ezetimibe (ZETIA) 10 MG tablet TAKE ONE (1) TABLET BY MOUTH EVERY DAY 90 tablet 3   fluticasone (FLONASE) 50 MCG/ACT nasal spray Place 2 sprays into both nostrils daily as needed for allergies or rhinitis.     hydroxychloroquine (PLAQUENIL) 200 MG tablet Take 200 mg by mouth 2 (two) times daily.     metoprolol tartrate (LOPRESSOR) 25 MG tablet TAKE ONE TABLET BY MOUTH TWICE A DAY 180 tablet 3   ondansetron (ZOFRAN) 4 MG tablet Take 4 mg by mouth every 8 (eight) hours as needed for nausea or vomiting.     pantoprazole (PROTONIX) 40 MG tablet Take 40 mg by mouth daily.     predniSONE (DELTASONE) 5 MG tablet Take 5 mg by mouth daily.      rosuvastatin (CRESTOR) 10 MG tablet Take 0.5 tablets (5 mg total) by mouth once a week. Pt needs to make appt with provider for further refills - 1st attempt 10 tablet 0   tamsulosin (FLOMAX) 0.4 MG CAPS capsule Take 0.4 mg by mouth 2 (two) times daily.      Vibegron (GEMTESA) 75 MG TABS Take by mouth daily.     No current facility-administered medications for this visit.    Review of Systems: GENERAL: negative for malaise, night sweats HEENT: No changes in hearing or vision, no nose bleeds or other nasal problems. NECK: Negative for lumps, goiter, pain and significant neck swelling RESPIRATORY: Negative for cough, wheezing CARDIOVASCULAR: Negative for chest pain, leg swelling, palpitations, orthopnea GI: SEE HPI MUSCULOSKELETAL: Negative for joint pain or swelling, back pain, and muscle pain. SKIN: Negative for lesions, rash HEMATOLOGY Negative for prolonged bleeding, bruising easily, and swollen nodes. ENDOCRINE: Negative for cold or heat intolerance, polyuria, polydipsia and goiter. NEURO:  negative for tremor, gait imbalance, syncope and seizures. The remainder of the review of systems is noncontributory.   Physical Exam: BP 109/74 (BP Location: Left Arm, Patient Position: Sitting, Cuff Size: Large)   Pulse (!) 103   Temp 97.9 F (36.6 C) (Oral)   Ht 5\' 8"  (1.727 m)   Wt 185 lb 11.2 oz (84.2 kg)   BMI 28.24 kg/m  GENERAL: The patient is  AO x3, in no acute distress. HEENT: Head is normocephalic and atraumatic. EOMI are intact. Mouth is well hydrated and without lesions. NECK: Supple. No masses LUNGS: Clear to auscultation. No presence of rhonchi/wheezing/rales. Adequate chest expansion HEART: RRR, normal s1 and s2. ABDOMEN: Soft, nontender, no guarding, no peritoneal signs, and nondistended. BS +. No masses.  Imaging/Labs: as above     Latest Ref Rng & Units 06/28/2022   11:58 AM 10/31/2021   10:04 AM 12/23/2020    4:29 AM  CBC  WBC 4.0 - 10.5 K/uL 10.3  8.4  8.6   Hemoglobin 13.0 - 17.0 g/dL 21.3  08.6  57.8   Hematocrit 39.0 - 52.0 % 46.7  48.0  35.9   Platelets 150 - 400 K/uL 219  200.0  150    No results found for: "IRON", "TIBC", "FERRITIN"  I personally reviewed and interpreted the available labs, imaging and endoscopic files.  Impression and Plan:  [name] is a 77 y.o. male with Sigmoid colon perforation due to diveticulitis  s/p Hartmann colectomy/colostomy reversal 06/15/2018   hypertension, diabetes A-fib on Eliquis ,? Alpha -Gal,interstitial lung disease on prednisone, CellCept,  who presents for evaluation of Nausea , abdominal distention and poor appetite.  #Loss of Appetite #Abdominal distention  #Nausea  Patient has sudden onset of symptoms which are concerning in anyone above the age of 40 as he has new upper GI symptoms.  At this time it is imperative to rule out malignancy  As per ACG guidelines patient above the age of 59 with new upper GI symptoms upper endoscopy is indicated.  Will proceed on urgent basis  Also patient seem  to have borderline vitamin B12 deficiency, hence while performing upper endoscopy will plan for mapping biopsies to rule out for any underlying dysplasia as well  Given loss of appetite and abdominal distention with constant nausea will evaluate other organs such as pancreas and liver with cross-sectional imaging to rule out malignancy.  Nausea is not responsive to Zofran and Reglan .will obtain CT abdomen pelvis with IV contrast on urgent basis  #History of Perforated Sigmoid diverticulitis  Patient reports after colostomy reversal he had colonoscopy possibly at [facility] but he is not sure.  This may be in year 2022.  Does not have any records at this time  Will obtain records from [facility] to assess when this patient due for colonoscopy  Randall Lawman, MD Gastroenterology and Hepatology [facility] Gastroenterology   This chart has been completed using [facility] Dictation software, and while attempts have been made to ensure accuracy , certain words and phrases may not be transcribed as intended

## 2023-04-16 NOTE — Telephone Encounter (Signed)
Patient is being scheduled for urgent work in for today due to request to hold Eliquis starting with PM dose 04/16/23.

## 2023-04-16 NOTE — Telephone Encounter (Signed)
Left message for pt to call back ASAP as he is needing to be added on today per pre op APP Eligha Bridegroom, NP. Dr. Tasia Catchings has confirmed procedure is urgent.

## 2023-04-16 NOTE — Patient Instructions (Signed)
It was very nice to meet you today, as dicussed with will plan for the following :  1) Upper endoscopy   2) CT Abdomen and Pelvis

## 2023-04-16 NOTE — Telephone Encounter (Signed)
Patient with diagnosis of atrial fibrillation on Eliquis for anticoagulation.    Procedure: EGD Date of procedure: 04/18/23   CHA2DS2-VASc Score = 4   This indicates a 4.8% annual risk of stroke. The patient's score is based upon: CHF History: 1 HTN History: 1 Diabetes History: 0 Stroke History: 0 Vascular Disease History: 1 Age Score: 1 Gender Score: 0   CrCl 82 Platelet count 219  Per office protocol, patient can hold Eliquis for 2 days prior to procedure.   Patient will not need bridging with Lovenox (enoxaparin) around procedure.  **This guidance is not considered finalized until pre-operative APP has relayed final recommendations.**

## 2023-04-16 NOTE — Telephone Encounter (Signed)
    04/16/23  [name] 29-Sep-1945  What type of surgery is being performed? EGD  When is surgery scheduled? 04/18/23  Clearance to hold Eliquis beginning this afternoon until after procedure  Name of physician performing surgery?  Dr. Starleen Arms [facility]enterology at [facility] Phone: [phone] Fax: [phone]  Anethesia type (none, local, MAC, general)? MAC

## 2023-04-17 ENCOUNTER — Encounter (HOSPITAL_COMMUNITY)
Admission: RE | Admit: 2023-04-17 | Discharge: 2023-04-17 | Disposition: A | Discharge status: Home / Self Care | Payer: PPO | Source: Ambulatory Visit | Attending: Gastroenterology | Admitting: Gastroenterology

## 2023-04-17 ENCOUNTER — Encounter (HOSPITAL_COMMUNITY): Payer: Self-pay

## 2023-04-17 ENCOUNTER — Other Ambulatory Visit: Payer: Self-pay

## 2023-04-17 NOTE — Anesthesia Preprocedure Evaluation (Signed)
Anesthesia Evaluation  Patient identified by MRN, date of birth, ID band Patient awake    Reviewed: Allergy & Precautions, H&P , NPO status , Patient's Chart, lab work & pertinent test results, reviewed documented beta blocker date and time   History of Anesthesia Complications (+) PONV, DIFFICULT AIRWAY and history of anesthetic complications  Airway Mallampati: II  TM Distance: >3 FB Neck ROM: full    Dental no notable dental hx. (+) Dental Advisory Given, Teeth Intact   Pulmonary shortness of breath, sleep apnea  Pulmonary fibrosis   Pulmonary exam normal breath sounds clear to auscultation       Cardiovascular Exercise Tolerance: Good hypertension, Normal cardiovascular exam+ dysrhythmias + Valvular Problems/Murmurs MVP  Rhythm:regular Rate:Normal  History viral myocarditis   Neuro/Psych negative neurological ROS  negative psych ROS   GI/Hepatic Neg liver ROS,GERD  ,,  Endo/Other  negative endocrine ROS    Renal/GU negative Renal ROS  negative genitourinary   Musculoskeletal  (+) Arthritis , Osteoarthritis,    Abdominal   Peds  Hematology negative hematology ROS (+)   Anesthesia Other Findings   Reproductive/Obstetrics negative OB ROS                             Anesthesia Physical Anesthesia Plan  ASA: 3  Anesthesia Plan: General   Post-op Pain Management: Minimal or no pain anticipated   Induction: Intravenous  PONV Risk Score and Plan: Propofol infusion  Airway Management Planned: Nasal Cannula and Natural Airway  Additional Equipment: None  Intra-op Plan:   Post-operative Plan:   Informed Consent: I have reviewed the patients History and Physical, chart, labs and discussed the procedure including the risks, benefits and alternatives for the proposed anesthesia with the patient or authorized representative who has indicated his/her understanding and acceptance.      Dental Advisory Given  Plan Discussed with: CRNA  Anesthesia Plan Comments:        Anesthesia Quick Evaluation

## 2023-04-18 ENCOUNTER — Ambulatory Visit (HOSPITAL_COMMUNITY): Payer: Self-pay | Admitting: Anesthesiology

## 2023-04-18 ENCOUNTER — Encounter (HOSPITAL_COMMUNITY)
Admission: RE | Disposition: A | Discharge status: Home / Self Care | Payer: Self-pay | Source: Home / Self Care | Attending: Gastroenterology

## 2023-04-18 ENCOUNTER — Encounter (HOSPITAL_COMMUNITY): Payer: Self-pay

## 2023-04-18 ENCOUNTER — Encounter (INDEPENDENT_AMBULATORY_CARE_PROVIDER_SITE_OTHER): Payer: Self-pay

## 2023-04-18 ENCOUNTER — Ambulatory Visit (HOSPITAL_COMMUNITY): Payer: PPO | Admitting: Anesthesiology

## 2023-04-18 ENCOUNTER — Telehealth (INDEPENDENT_AMBULATORY_CARE_PROVIDER_SITE_OTHER): Payer: Self-pay | Admitting: Gastroenterology

## 2023-04-18 ENCOUNTER — Other Ambulatory Visit: Payer: Self-pay

## 2023-04-18 ENCOUNTER — Ambulatory Visit (HOSPITAL_COMMUNITY)
Admission: RE | Admit: 2023-04-18 | Discharge: 2023-04-18 | Disposition: A | Discharge status: Home / Self Care | Payer: PPO | Attending: Gastroenterology | Admitting: Gastroenterology

## 2023-04-18 DIAGNOSIS — J8410 Pulmonary fibrosis, unspecified: Secondary | ICD-10-CM | POA: Insufficient documentation

## 2023-04-18 DIAGNOSIS — R1013 Epigastric pain: Secondary | ICD-10-CM | POA: Diagnosis not present

## 2023-04-18 DIAGNOSIS — K3189 Other diseases of stomach and duodenum: Secondary | ICD-10-CM

## 2023-04-18 DIAGNOSIS — K2970 Gastritis, unspecified, without bleeding: Secondary | ICD-10-CM

## 2023-04-18 DIAGNOSIS — Z9049 Acquired absence of other specified parts of digestive tract: Secondary | ICD-10-CM | POA: Insufficient documentation

## 2023-04-18 DIAGNOSIS — R110 Nausea: Secondary | ICD-10-CM | POA: Insufficient documentation

## 2023-04-18 DIAGNOSIS — R7303 Prediabetes: Secondary | ICD-10-CM

## 2023-04-18 DIAGNOSIS — Z7952 Long term (current) use of systemic steroids: Secondary | ICD-10-CM | POA: Diagnosis not present

## 2023-04-18 DIAGNOSIS — K2281 Esophageal polyp: Secondary | ICD-10-CM | POA: Diagnosis not present

## 2023-04-18 DIAGNOSIS — G4730 Sleep apnea, unspecified: Secondary | ICD-10-CM | POA: Insufficient documentation

## 2023-04-18 DIAGNOSIS — K317 Polyp of stomach and duodenum: Secondary | ICD-10-CM

## 2023-04-18 DIAGNOSIS — K319 Disease of stomach and duodenum, unspecified: Secondary | ICD-10-CM | POA: Diagnosis not present

## 2023-04-18 DIAGNOSIS — I4891 Unspecified atrial fibrillation: Secondary | ICD-10-CM | POA: Diagnosis not present

## 2023-04-18 DIAGNOSIS — I341 Nonrheumatic mitral (valve) prolapse: Secondary | ICD-10-CM | POA: Insufficient documentation

## 2023-04-18 DIAGNOSIS — E119 Type 2 diabetes mellitus without complications: Secondary | ICD-10-CM | POA: Diagnosis not present

## 2023-04-18 DIAGNOSIS — K219 Gastro-esophageal reflux disease without esophagitis: Secondary | ICD-10-CM | POA: Diagnosis not present

## 2023-04-18 DIAGNOSIS — Z91014 Allergy to mammalian meats: Secondary | ICD-10-CM | POA: Insufficient documentation

## 2023-04-18 DIAGNOSIS — I10 Essential (primary) hypertension: Secondary | ICD-10-CM | POA: Insufficient documentation

## 2023-04-18 DIAGNOSIS — K2282 Esophagogastric junction polyp: Secondary | ICD-10-CM | POA: Insufficient documentation

## 2023-04-18 DIAGNOSIS — R6881 Early satiety: Secondary | ICD-10-CM | POA: Diagnosis not present

## 2023-04-18 DIAGNOSIS — Z79624 Long term (current) use of inhibitors of nucleotide synthesis: Secondary | ICD-10-CM | POA: Diagnosis not present

## 2023-04-18 DIAGNOSIS — Z7901 Long term (current) use of anticoagulants: Secondary | ICD-10-CM | POA: Insufficient documentation

## 2023-04-18 DIAGNOSIS — E538 Deficiency of other specified B group vitamins: Secondary | ICD-10-CM | POA: Insufficient documentation

## 2023-04-18 HISTORY — PX: BIOPSY: SHX5522

## 2023-04-18 HISTORY — PX: ESOPHAGOGASTRODUODENOSCOPY (EGD) WITH PROPOFOL: SHX5813

## 2023-04-18 LAB — PULMONARY FUNCTION TEST
DL/VA % pred: 79 %
DL/VA: 3.19 ml/min/mmHg/L
DLCO cor % pred: 59 %
DLCO cor: 14.15 ml/min/mmHg
DLCO unc % pred: 59 %
DLCO unc: 14.15 ml/min/mmHg
FEF 25-75 Pre: 3.28 L/s
FEF2575-%Pred-Pre: 160 %
FEV1-%Pred-Pre: 87 %
FEV1-Pre: 2.47 L
FEV1FVC-%Pred-Pre: 118 %
FEV6-%Pred-Pre: 78 %
FEV6-Pre: 2.86 L
FEV6FVC-%Pred-Pre: 106 %
FVC-%Pred-Pre: 73 %
FVC-Pre: 2.86 L
Pre FEV1/FVC ratio: 86 %
Pre FEV6/FVC Ratio: 100 %

## 2023-04-18 SURGERY — ESOPHAGOGASTRODUODENOSCOPY (EGD) WITH PROPOFOL
Anesthesia: General

## 2023-04-18 MED ORDER — PHENYLEPHRINE 80 MCG/ML (10ML) SYRINGE FOR IV PUSH (FOR BLOOD PRESSURE SUPPORT)
PREFILLED_SYRINGE | INTRAVENOUS | Status: DC | PRN
  Administered 2023-04-18 (×4): 80 ug via INTRAVENOUS

## 2023-04-18 MED ORDER — LIDOCAINE HCL (CARDIAC) PF 100 MG/5ML IV SOSY
PREFILLED_SYRINGE | INTRAVENOUS | Status: DC | PRN
  Administered 2023-04-18: 60 mg via INTRAVENOUS

## 2023-04-18 MED ORDER — LACTATED RINGERS IV SOLN: INTRAVENOUS | Status: DC | PRN

## 2023-04-18 MED ORDER — PROPOFOL 10 MG/ML IV BOLUS
INTRAVENOUS | Status: DC | PRN
  Administered 2023-04-18 (×4): 50 mg via INTRAVENOUS

## 2023-04-18 NOTE — Interval H&P Note (Signed)
History and Physical Interval Note:  04/18/2023 7:24 AM  [name]  has presented today for surgery, with the diagnosis of NAUSEA, EARLY SATIETY, ABDOMINAL PAIN.  The various methods of treatment have been discussed with the patient and family. After consideration of risks, benefits and other options for treatment, the patient has consented to  Procedure(s) with comments: ESOPHAGOGASTRODUODENOSCOPY (EGD) WITH PROPOFOL (N/A) - 2:05PM;ASA 1-2 as a surgical intervention.  The patient's history has been reviewed, patient examined, no change in status, stable for surgery.  I have reviewed the patient's chart and labs.  Questions were answered to the patient's satisfaction.     [name]

## 2023-04-18 NOTE — Discharge Instructions (Addendum)

## 2023-04-18 NOTE — Transfer of Care (Signed)
Immediate Anesthesia Transfer of Care Note  Patient: [name]  Procedure(s) Performed: ESOPHAGOGASTRODUODENOSCOPY (EGD) WITH PROPOFOL BIOPSY  Patient Location: Short Stay  Anesthesia Type:General  Level of Consciousness: awake, alert , oriented, and patient cooperative  Airway & Oxygen Therapy: Patient Spontanous Breathing  Post-op Assessment: Report given to RN, Post -op Vital signs reviewed and stable, and Patient moving all extremities X 4  Post vital signs: Reviewed and stable  Last Vitals:  Vitals Value Taken Time  BP 92/46 04/18/23 0803  Temp 36.6 C 04/18/23 0803  Pulse 71 04/18/23 0803  Resp 14 04/18/23 0803  SpO2 97 04/18/23 0804    Last Pain:  Vitals:   04/18/23 0803  TempSrc: Oral  PainSc: 0-No pain         Complications: No notable events documented.

## 2023-04-18 NOTE — Anesthesia Postprocedure Evaluation (Signed)
Anesthesia Post Note  Patient: [name]  Procedure(s) Performed: ESOPHAGOGASTRODUODENOSCOPY (EGD) WITH PROPOFOL BIOPSY  Patient location during evaluation: Phase II Anesthesia Type: General Level of consciousness: awake Pain management: pain level controlled Vital Signs Assessment: post-procedure vital signs reviewed and stable Respiratory status: spontaneous breathing and respiratory function stable Cardiovascular status: blood pressure returned to baseline and stable Postop Assessment: no headache and no apparent nausea or vomiting Anesthetic complications: no Comments: Late entry   No notable events documented.   Last Vitals:  Vitals:   04/18/23 0723 04/18/23 0803  BP: 128/89 (!) 92/46  Pulse: 85 71  Resp: 20 14  Temp: 37.3 C 36.6 C  SpO2: 95%     Last Pain:  Vitals:   04/18/23 0803  TempSrc: Oral  PainSc: 0-No pain                 Windell Norfolk

## 2023-04-18 NOTE — Telephone Encounter (Signed)
Spoke with Gigi Gin at [facility] for another pt and Gigi Gin states she pt will need to arrive early for CT scan on Saturday. Pt originally was told to be there at 1:45pm. Pt will need to arrive at 11:45am because he will need to drink contrast. Will send pt a my chart message due to pt having procedure done today.

## 2023-04-18 NOTE — Op Note (Signed)
[facility] Patient Name: [name] Procedure Date: 04/18/2023 7:12 AM MRN: [id number] Date of Birth: [birth year] Attending MD: Sanjuan Dame , MD, [id number] CSN: [id number] Age: 77 Admit Type: Outpatient Procedure:                Upper GI endoscopy Indications:              Epigastric abdominal pain, Dyspepsia, Early                            satiety, Nausea Providers:                Sanjuan Dame, MD, Nena Polio, RN, Zena Amos Referring MD:             Sanjuan Dame, MD Medicines:                Monitored Anesthesia Care Complications:            No immediate complications. Estimated Blood Loss:     Estimated blood loss was minimal. Procedure:                Pre-Anesthesia Assessment:                           - Prior to the procedure, a History and Physical                            was performed, and patient medications and                            allergies were reviewed. The patient's tolerance of                            previous anesthesia was also reviewed. The risks                            and benefits of the procedure and the sedation                            options and risks were discussed with the patient.                            All questions were answered, and informed consent                            was obtained. Prior Anticoagulants: The patient has                            taken Eliquis (apixaban), last dose was 2 days                            prior to procedure. ASA Grade Assessment: III - A                            patient with severe systemic disease. After  reviewing the risks and benefits, the patient was                            deemed in satisfactory condition to undergo the                            procedure.                           After obtaining informed consent, the endoscope was                            passed under direct vision. Throughout the                             procedure, the patient's blood pressure, pulse, and                            oxygen saturations were monitored continuously. The                            [id number]-H190 ([id number]) scope was introduced through the                            mouth, and advanced to the second part of duodenum.                            The upper GI endoscopy was accomplished without                            difficulty. The patient tolerated the procedure                            well. Scope In: 7:40:24 AM Scope Out: 8:02:23 AM Total Procedure Duration: 0 hours 21 minutes 59 seconds  Findings:      The examined esophagus was normal.      A single 6 mm sessile polyp with no bleeding and no stigmata of recent       bleeding was found at the gastroesophageal junction. The polyp was       removed with a cold snare. Resection and retrieval were complete.      Multiple 2 to 6 mm sessile polyps with no bleeding and no stigmata of       recent bleeding were found in the stomach. The polyp was removed with a       cold snare. Resection and retrieval were complete.      Mild inflammation characterized by congestion (edema) and erythema was       found in the entire examined stomach. Biopsies were taken with a cold       forceps for histology.      The duodenal bulb and second portion of the duodenum were normal. Impression:               - Normal esophagus.                           -  A single gastroesophageal junction polyp.                            Resected and retrieved.                           - Multiple gastric polyps. Resected and retrieved.                           - Gastritis. Biopsied. [place] protocol                           - Normal duodenal bulb and second portion of the                            duodenum. Moderate Sedation:      Per Anesthesia Care Recommendation:           - Patient has a contact number available for                            emergencies. The signs and symptoms of potential                             delayed complications were discussed with the                            patient. Return to normal activities tomorrow.                            Written discharge instructions were provided to the                            patient.                           - Resume previous diet.                           - Continue present medications.                           - Await pathology results.                           -Proceed with CT scan as scheduled                           -Obtain colonoscopy report Procedure Code(s):        --- Professional ---                           [phone], Esophagogastroduodenoscopy, flexible,                            transoral; with removal of tumor(s), polyp(s), or  other lesion(s) by snare technique                           43239, 59, Esophagogastroduodenoscopy, flexible,                            transoral; with biopsy, single or multiple Diagnosis Code(s):        --- Professional ---                           K22.82, Esophagogastric junction polyp                           K31.7, Polyp of stomach and duodenum                           K29.70, Gastritis, unspecified, without bleeding                           R10.13, Epigastric pain                           R68.81, Early satiety                           R11.0, Nausea CPT copyright 2022 American Medical Association. All rights reserved. The codes documented in this report are preliminary and upon coder review may  be revised to meet current compliance requirements. Sanjuan Dame, MD Sanjuan Dame, MD 04/18/2023 8:00:50 AM This report has been signed electronically. Number of Addenda: 0

## 2023-04-20 ENCOUNTER — Ambulatory Visit (HOSPITAL_COMMUNITY)
Admission: RE | Admit: 2023-04-20 | Discharge: 2023-04-20 | Discharge status: Home / Self Care | Payer: PPO | Source: Ambulatory Visit | Attending: Gastroenterology

## 2023-04-20 DIAGNOSIS — I700 Atherosclerosis of aorta: Secondary | ICD-10-CM | POA: Insufficient documentation

## 2023-04-20 DIAGNOSIS — N400 Enlarged prostate without lower urinary tract symptoms: Secondary | ICD-10-CM | POA: Diagnosis not present

## 2023-04-20 DIAGNOSIS — K4090 Unilateral inguinal hernia, without obstruction or gangrene, not specified as recurrent: Secondary | ICD-10-CM | POA: Diagnosis not present

## 2023-04-20 DIAGNOSIS — S2232XD Fracture of one rib, left side, subsequent encounter for fracture with routine healing: Secondary | ICD-10-CM | POA: Insufficient documentation

## 2023-04-20 DIAGNOSIS — S2231XD Fracture of one rib, right side, subsequent encounter for fracture with routine healing: Secondary | ICD-10-CM | POA: Diagnosis not present

## 2023-04-20 DIAGNOSIS — R6881 Early satiety: Secondary | ICD-10-CM | POA: Diagnosis not present

## 2023-04-20 DIAGNOSIS — K760 Fatty (change of) liver, not elsewhere classified: Secondary | ICD-10-CM | POA: Insufficient documentation

## 2023-04-20 DIAGNOSIS — X58XXXD Exposure to other specified factors, subsequent encounter: Secondary | ICD-10-CM | POA: Insufficient documentation

## 2023-04-20 DIAGNOSIS — K5730 Diverticulosis of large intestine without perforation or abscess without bleeding: Secondary | ICD-10-CM | POA: Insufficient documentation

## 2023-04-20 MED ORDER — IOHEXOL 300 MG/ML  SOLN
30.0000 mL | Freq: Once | INTRAMUSCULAR | Status: AC | PRN
  Administered 2023-04-20: 30 mL via ORAL

## 2023-04-20 MED ORDER — IOHEXOL 300 MG/ML  SOLN
100.0000 mL | Freq: Once | INTRAMUSCULAR | Status: AC | PRN
  Administered 2023-04-20: 100 mL via INTRAVENOUS

## 2023-04-22 ENCOUNTER — Telehealth (INDEPENDENT_AMBULATORY_CARE_PROVIDER_SITE_OTHER): Payer: Self-pay | Admitting: Gastroenterology

## 2023-04-22 LAB — SURGICAL PATHOLOGY

## 2023-04-22 NOTE — Telephone Encounter (Signed)
Received records from [place] [facility]:  12/2018     Repeat colonoscopy due 2025

## 2023-04-24 ENCOUNTER — Telehealth (INDEPENDENT_AMBULATORY_CARE_PROVIDER_SITE_OTHER): Payer: Self-pay

## 2023-04-24 NOTE — Progress Notes (Signed)
I reviewed the pathology results. Ann, can you send her a letter with the findings as described below please? Repeat colonoscopy in 8 months Repeat upper endoscopy in 8 months  Thanks,  Vista Lawman, MD Gastroenterology and Hepatology [facility]  ---------------------------------------------------------------------------------------------  [facility] [address], [place], [place] [address] Phone:  [phone]   04/24/23 [name], [place]   Dear [name],  I am writing to inform you that the biopsies taken during your recent endoscopic examination showed:  No H. Pylori bacteria in stomach , or any early cancer changes to the stomach mucosa ( Intestinal metaplasia)   One of the gastric polyp reported to be hyperplastic polyp which is not cancerous but recommended repeat upper endoscopy in 1 year to take more biopsies ( mapping biopsies)  All above is good news, I am awaiting results of your CT scan   Also received records of your previous colonoscopy , you are due next for colonoscopy 12/2023  Also I value your feedback , so if you get a survey , please take the time to fill it out and thank you for choosing [facility]  Please call us at [phone] if you have persistent problems or have questions about your condition that have not been fully answered at this time.  Sincerely,  Vista Lawman, MD Gastroenterology and Hepatology

## 2023-04-24 NOTE — Telephone Encounter (Signed)
Patient calling into the office today with continuous issues of nausea, bloating, upper abdominal pain and loss of appetite, and diarrhea for the last week. He says he has also been having issues with staying tired and sleeping every day until noon. Patient says Zofran is not helping, he takes pantoprazole 40 mg po prn. Had an EGD on 04/18/2023, Ct scan this past Saturday 04/20/2023, which has not been read. I have called [facility] and asked that the expedite the reading of this test as the patient has worsening of his symptoms. Ruby there at [facility] states she will expedite this to the Radiologist to read today, and send the results over.Patient denies the use of lomotil, imodium, and denies any sight of dark or bloody stools, fever. He is requesting an appointment this week with Dr. Tasia Catchings, which Dr. Tasia Catchings has nothing available this week. We do have an opening tomorrow 04/25/2023 @1 :15 pm with Doylene Bode, NP. I have spoken with [facility] and she has agreed to see him tomorrow. I made the patient aware of the date, time, and provider of the appointment.

## 2023-04-25 ENCOUNTER — Encounter (INDEPENDENT_AMBULATORY_CARE_PROVIDER_SITE_OTHER): Payer: Self-pay | Admitting: *Deleted

## 2023-04-25 ENCOUNTER — Ambulatory Visit (INDEPENDENT_AMBULATORY_CARE_PROVIDER_SITE_OTHER): Payer: PPO | Admitting: Gastroenterology

## 2023-04-25 VITALS — BP 123/82 | HR 120 | Temp 97.8°F | Ht 68.0 in | Wt 182.3 lb

## 2023-04-25 DIAGNOSIS — R140 Abdominal distension (gaseous): Secondary | ICD-10-CM | POA: Diagnosis not present

## 2023-04-25 DIAGNOSIS — R1010 Upper abdominal pain, unspecified: Secondary | ICD-10-CM

## 2023-04-25 DIAGNOSIS — R110 Nausea: Secondary | ICD-10-CM

## 2023-04-25 DIAGNOSIS — K2970 Gastritis, unspecified, without bleeding: Secondary | ICD-10-CM

## 2023-04-25 MED ORDER — PROMETHAZINE HCL 12.5 MG PO TABS: 12.5000 mg | ORAL_TABLET | Freq: Three times a day (TID) | ORAL | 0 refills | Status: DC | PRN

## 2023-04-25 MED ORDER — SUCRALFATE 1 GM/10ML PO SUSP: 1.0000 g | Freq: Four times a day (QID) | ORAL | 1 refills | Status: DC

## 2023-04-25 MED ORDER — OMEPRAZOLE 40 MG PO CPDR: 40.0000 mg | DELAYED_RELEASE_CAPSULE | Freq: Every day | ORAL | 1 refills | Status: DC

## 2023-04-25 NOTE — Progress Notes (Signed)
Referring Provider: Benita Stabile, MD Primary Care Physician:  Benita Stabile, MD Primary GI Physician: Dr. Tasia Catchings   Chief Complaint  Patient presents with   Nausea    Follow up on nausea. Only ate half a cup of bannana pudding yesterday. Nothing today. Not drinking much. Has pain in mid abdomen after eating and drinking. Dry heaves yesterday and today. Having bloating. Zofran not helping with nausea. Having loose stools. Had one today.    HPI:   [name] is a 77 y.o. male with past medical history of Sigmoid colon perforation due to diveticulitis  s/p Hartmann colectomy/colostomy reversal 06/15/2018   hypertension, diabetes A-fib on Eliquis ,? Alpha -Gal,interstitial lung disease on prednisone, CellCept  Patient presenting today for follow up of nausea and abdominal pain   Last seen 04/16/23, at that time reporting loss of appetite for the past 20 to 30 days.  No desire to eat.  Having some abdominal distention and persistent nausea.  Does report nausea since 2020 when he had perforated diverticulitis but has worsened recently and not responding to Zofran and Reglan.  Was recommended to have EGD and CT abdomen pelvis with contrast  EGD as outlined below, biopsies negative for H. pylori or any changes of malignancy in the stomach.  1 hyperplastic polyp noted in the stomach with recommendations to repeat EGD in a year.  CT A/P with contrast done 04/20/2023 with no acute abdominopelvic findings, hepatic cytosis, healing right posterior 11th and left lateral fifth eighth and ninth rib fractures, colonic diverticulosis without acute diverticulitis  Present: States he has chronic nausea for the past few years but worse along with onset of abdominal pain, bloating for the past 6-8 weeks. Notes bloating occurring about 50% of the time. He did start gemtezza prior to this but he stopped the med but did not have any improvement. He notes that he has pain in his abdomen all the time but worse  when he eats. Pain is located in mid abdomen. He is avoiding eating due to the pain. He is dry heaving. He notes that sometimes drinking a sip of wine and belching after will help his pain. He has tried tonic water as well but this does not help. Pain does not radiate to his back or to either side of his abdomen. Does not seem to depend on what he eats. He has lost about 5 pounds since onset of symptoms. He was taking pantoprazole regularly at onset of symptoms but noted no improvement so he takes this only on occasion now. No real heartburn or acid regurgitation. He has tried both zofran and reglan without much improvement. Denies NSAID use, takes tylenol if he needs something for pain.   Reports history of IBS, has loose stools off and on, no rectal bleeding or melena. Denies any recent changes in his bowel habits.   He notes a fall about a year ago where he cracked some ribs. No recent falls or injuries that he is aware of.   Last Colonoscopy: 12/2018 diverticulosis in proximal sigmoid colon, distal sigmoid colon and descending colon, 1 small polyp in distal descending colon, entire examined portion of ileum was normal, and sigmoid colostomy characterized by inflammation, examination otherwise normal Last Endoscopy: 04/18/2023- Normal esophagus.                           - A single gastroesophageal junction polyp.  Resected and retrieved.                           - Multiple gastric polyps. Resected and retrieved.                           - Gastritis. Biopsied. [place] protocol                           - Normal duodenal bulb and second portion of the                            duodenum.  Recommendations:    Past Medical History:  Diagnosis Date   Allergy to alpha-gal    2015; has been able to re-introduce red meat for the past 2 1/2 years without reaction (as of 01/21/19)   Arthritis    Chest pain    a. 03/2017: echo showing EF of 60-65%, no regional WMA or  significant valve abnormalities. b. 03/2016: NST with no evidence of ischemia.    Complication of anesthesia    "anaphylactic" reaction to Xylocaine > 10 years ago; reported later recieved marcaine without issue   Difficult intubation 02/19/2018   No difficulty 01/26/19 with Mac and 3 or 11/10/19 with Glidescope   Digestive problems    on Prednisone prn for this issue- diarrhea   Dyspnea    pulmonary fibrosis   Dysrhythmia    irregular due to Myocarditis- takes Atenolol, Norvasc   GERD (gastroesophageal reflux disease)    H/O viral myocarditis    25 years ago   Head injury, closed, with concussion    Breif LOC   Hyperlipidemia    Hypertension    Inguinal hernia    Mild mitral valve prolapse    per Dr Landry Dyke notes   PAF (paroxysmal atrial fibrillation) (HCC)    a. initially occuring in 02/2016. b. recurrent in 07/2016. Placed on Eliquis   Polymyalgia rheumatica (HCC)    PONV (postoperative nausea and vomiting)    Pre-diabetes    elevated due to high dose of prednsione for back no longer elevated   Pulmonary fibrosis (HCC)    Sleep apnea    no cpap mild    Past Surgical History:  Procedure Laterality Date   ANTERIOR CERVICAL DECOMP/DISCECTOMY FUSION N/A 11/10/2019   Procedure: Cervical Three-Four Anterior cervical decompression/discectomy/fusion;  Surgeon: Barnett Abu, MD;  Location: [facility] OR;  Service: Neurosurgery;  Laterality: N/A;  Cervical Three-Four Anterior cervical decompression/discectomy/fusion   apendectomy  1965   APPENDECTOMY     APPLICATION OF ROBOTIC ASSISTANCE FOR SPINAL PROCEDURE N/A 12/22/2020   Procedure: APPLICATION OF ROBOTIC ASSISTANCE FOR SPINAL PROCEDURE;  Surgeon: Barnett Abu, MD;  Location: [facility] OR;  Service: Neurosurgery;  Laterality: N/A;   BACK SURGERY     bone spur Bilateral 1999   feet   CARDIAC CATHETERIZATION  2001   CHOLECYSTECTOMY  2004   COLON RESECTION N/A 06/15/2018   Procedure: SIGMOID COLON RESECTION;  Surgeon: Griselda Miner, MD;   Location: [facility] ORS;  Service: General;  Laterality: N/A;   COLONOSCOPY     COLOSTOMY N/A 06/15/2018   Procedure: COLOSTOMY;  Surgeon: Griselda Miner, MD;  Location: [facility] ORS;  Service: General;  Laterality: N/A;   COLOSTOMY TAKEDOWN N/A 01/26/2019   Procedure: LAPAROSCOPIC ASSISTED COLOSTOMY TAKEDOWN;  Surgeon: Chevis Pretty III,  MD;  Location: [facility] OR;  Service: General;  Laterality: N/A;   DG GREAT TOE LEFT FOOT Bilateral    bone spurs   EYE SURGERY Bilateral    cataract removal   LAPAROSCOPIC LYSIS OF ADHESIONS N/A 01/26/2019   Procedure: LAPAROSCOPIC LYSIS OF ADHESIONS;  Surgeon: Griselda Miner, MD;  Location: [facility] OR;  Service: General;  Laterality: N/A;   LAPAROTOMY N/A 06/15/2018   Procedure: EXPLORATORY LAPAROTOMY;  Surgeon: Griselda Miner, MD;  Location: [facility];  Service: General;  Laterality: N/A;   LUMBAR LAMINECTOMY/ DECOMPRESSION WITH MET-RX Right 05/05/2013   Procedure: Right Lumbar three-four Extraforaminal Microdiskectomy with Metrex;  Surgeon: Barnett Abu, MD;  Location: [facility];  Service: Neurosurgery;  Laterality: Right;  Right Lumbar three-four Extraforaminal Microdiskectomy with Metrex   LUNG BIOPSY Right 02/19/2018   Procedure: LUNG BIOPSY;  Surgeon: Loreli Slot, MD;  Location: [facility] OR;  Service: Thoracic;  Laterality: Right;   POSTERIOR LUMBAR FUSION 4 LEVEL N/A 12/22/2020   Procedure: Lumbar one-two Posterior lumbar interbody fusion with stabilization from Thoracic ten to Lumbar one with robotic screw placement;  Surgeon: Barnett Abu, MD;  Location: [facility] OR;  Service: Neurosurgery;  Laterality: N/A;  RM 20   REVERSE SHOULDER ARTHROPLASTY Right 07/11/2022   Procedure: REVERSE SHOULDER ARTHROPLASTY;  Surgeon: Bjorn Pippin, MD;  Location: [facility] OR[facility];  Service: Orthopedics;  Laterality: Right;   SHOULDER OPEN ROTATOR CUFF REPAIR Bilateral 2001   TONSILLECTOMY     VIDEO ASSISTED THORACOSCOPY Right 02/19/2018   Procedure: VIDEO ASSISTED THORACOSCOPY;  Surgeon: Loreli Slot, MD;  Location: [facility] OR;  Service: Thoracic;  Laterality: Right;    Current Outpatient Medications  Medication Sig Dispense Refill   acetaminophen (ACETAMINOPHEN 8 HOUR) 650 MG CR tablet Take 650 mg by mouth every 8 (eight) hours as needed for pain.     cetirizine (ZYRTEC) 10 MG tablet Take 10 mg by mouth daily as needed for allergies.      Cholecalciferol (DIALYVITE VITAMIN D 5000) 125 MCG (5000 UT) capsule Take 5,000 Units by mouth daily.     diltiazem (CARDIZEM CD) 120 MG 24 hr capsule TAKE ONE CAPSULE BY MOUTH DAILY 90 capsule 3   ELIQUIS 5 MG TABS tablet TAKE ONE TABLET (5MG  TOTAL) BY MOUTH TWODAILY (Patient taking differently: Take 5 mg by mouth 2 (two) times daily.) 180 tablet 1   ezetimibe (ZETIA) 10 MG tablet TAKE ONE (1) TABLET BY MOUTH EVERY DAY 90 tablet 3   fluticasone (FLONASE) 50 MCG/ACT nasal spray Place 2 sprays into both nostrils daily as needed for allergies or rhinitis.     hydroxychloroquine (PLAQUENIL) 200 MG tablet Take 200 mg by mouth 2 (two) times daily.     metoprolol tartrate (LOPRESSOR) 25 MG tablet TAKE ONE TABLET BY MOUTH TWICE A DAY 180 tablet 3   ondansetron (ZOFRAN) 4 MG tablet Take 4 mg by mouth every 8 (eight) hours as needed for nausea or vomiting.     pantoprazole (PROTONIX) 40 MG tablet Take 40 mg by mouth daily.     predniSONE (DELTASONE) 5 MG tablet Take 5 mg by mouth daily.      rosuvastatin (CRESTOR) 10 MG tablet Take 0.5 tablets (5 mg total) by mouth once a week. Pt needs to make appt with provider for further refills - 1st attempt 10 tablet 0   tamsulosin (FLOMAX) 0.4 MG CAPS capsule Take 0.4 mg by mouth 2 (two) times daily.      Vibegron (GEMTESA) 75 MG  TABS Take by mouth daily.     diphenoxylate-atropine (LOMOTIL) 2.5-0.025 MG tablet Take 1 tablet by mouth 4 (four) times daily as needed for diarrhea or loose stools. (Patient not taking: Reported on 04/25/2023)     No current facility-administered medications for this visit.    Allergies as  of 04/25/2023 - Review Complete 04/25/2023  Allergen Reaction Noted   Xylocaine [lidocaine] Anaphylaxis 09/25/2016   Codeine Nausea And Vomiting 01/24/2015   Statins Other (See Comments) 09/25/2016   Aldactone [spironolactone]  12/08/2020   Alpha-gal  12/23/2020   Pirfenidone Other (See Comments) 02/01/2022   Percocet [oxycodone-acetaminophen] Itching 02/02/2015    Family History  Problem Relation Age of Onset   Rheum arthritis Mother    Thyroid cancer Mother    Heart disease Father    Asthma Father    Thyroid cancer Sister    Melanoma Brother     Social History   Socioeconomic History   Marital status: Married    Spouse name: Not on file   Number of children: Not on file   Years of education: Not on file   Highest education level: Not on file  Occupational History   Not on file  Tobacco Use   Smoking status: Never   Smokeless tobacco: Never  Vaping Use   Vaping status: Never Used  Substance and Sexual Activity   Alcohol use: Yes    Alcohol/week: 21.0 standard drinks of alcohol    Types: 21 Glasses of wine per week    Comment: 2/3 glasses wine in evening   Drug use: No   Sexual activity: Yes    Birth control/protection: Other-see comments    Comment: old age  Other Topics Concern   Not on file  Social History Narrative   Not on file   Social Drivers of Health   Financial Resource Strain: Not on file  Food Insecurity: Not on file  Transportation Needs: Not on file  Physical Activity: Not on file  Stress: Not on file  Social Connections: Not on file    Review of systems General: negative for malaise, night sweats, fever, chills, weight loss Neck: Negative for lumps, goiter, pain and significant neck swelling Resp: Negative for cough, wheezing, dyspnea at rest CV: Negative for chest pain, leg swelling, palpitations, orthopnea GI: denies melena, hematochezia,  vomiting, diarrhea, constipation, dysphagia, odyonophagia, early satiety or unintentional weight  loss. +abdominal bloating +upper abdominal pain +nausea MSK: Negative for joint pain or swelling, back pain, and muscle pain. Derm: Negative for itching or rash Psych: Denies depression, anxiety, memory loss, confusion. No homicidal or suicidal ideation.  Heme: Negative for prolonged bleeding, bruising easily, and swollen nodes. Endocrine: Negative for cold or heat intolerance, polyuria, polydipsia and goiter. Neuro: negative for tremor, gait imbalance, syncope and seizures. The remainder of the review of systems is noncontributory.  Physical Exam: There were no vitals taken for this visit. General:   Alert and oriented. No distress noted. Pleasant and cooperative.  Head:  Normocephalic and atraumatic. Eyes:  Conjuctiva clear without scleral icterus. Mouth:  Oral mucosa pink and moist. Good dentition. No lesions. Heart: Normal rate and rhythm, s1 and s2 heart sounds present.  Lungs: Clear lung sounds in all lobes. Respirations equal and unlabored. Abdomen:  +BS, soft,  and non-distended. +TTP of upper abdomen. No rebound or guarding. No HSM or masses noted. Derm: No palmar erythema or jaundice Msk:  Symmetrical without gross deformities. Normal posture. Extremities:  Without edema. Neurologic:  Alert and  oriented  x4 Psych:  Alert and cooperative. Normal mood and affect.  Invalid input(s): "6 MONTHS"   ASSESSMENT: [name] is a 77 y.o. male presenting today for follow up of nausea, bloating and abdominal pain  Etiology of nausea/bloating/abdominal pain unclear, EGD as above with findings of gastritis, CT A/P without acute findings to explain patient's symptoms. No radiation of pain. Presentation not consistent with gallbladder etiology. Not currently on PPI therapy, will try treating his gastritis a bit more aggressively with omeprazole 40mg  daily and carafate 1g QID, if symptoms not improving will need to consider SIBO testing and/or GES. Notably his A1c earlier this year  was 5.2. low suspicion for gastroparesis but will need to rule this out if SIBO testing is negative. As zofran is not providing results for his nausea, will send low dose phenergan to take.    PLAN:  -Start omeprazole 40mg  daily  -carafate 1g before meals  -Start daily probiotic -patient to call with update on symptoms in 7-10 days -phenergan 12.5mg  Q8H, PRN, pt advised to be mindful of drowsiness with this   - Consider SIBO testing and/or GES if not improving  All questions were answered, patient verbalized understanding and is in agreement with plan as outlined above.   Follow Up: 6 weeks   Moorea Boissonneault L. Jeanmarie Hubert, MSN, APRN, AGNP-C Adult-Gerontology Nurse Practitioner [facility]

## 2023-04-25 NOTE — Progress Notes (Signed)
Hi Wendy ,  Can you please call the patient and tell the patient the CT scan of the abdomen and pelvis did not suggest any mass or malignancy . It however mentions Healing right posterior eleventh and left lateral fifth, eighth, and ninth rib fractures.  Thank you Chelsea for seeing him today , please evaluate his continued symptoms, perhaps may recommend CT head ( persistent nausea) and colonoscopy (2025) etc.  Thanks,  Vista Lawman, MD Gastroenterology and Hepatology [facility]

## 2023-04-25 NOTE — Patient Instructions (Addendum)
As discussed you had gastritis (inflammation in the stomach) on your EGD, this can cause symptoms such as you are having. I would like to try and treat this more aggressively to see if your symptoms improve -Start omeprazole 40mg  daily, take this 30 minutes before eating in the mornings -carafate 1g before meals and at bedtime -Start daily probiotic -I have also sent phenergan 12.5mg  to take every 8 hours as needed for nausea, please be mindful this can cause drowsiness  Please call with an update in 7-10 days on how you are feeling, will consider further testing if the above does not provide improvement of your symptoms  Follow up 6 weeks  It was a pleasure to see you today. I want to create trusting relationships with patients and provide genuine, compassionate, and quality care. I truly value your feedback! please be on the lookout for a survey regarding your visit with me today. I appreciate your input about our visit and your time in completing this!    Noha Milberger L. Jeanmarie Hubert, MSN, APRN, AGNP-C Adult-Gerontology Nurse Practitioner [facility] at [facility]

## 2023-04-26 ENCOUNTER — Encounter (HOSPITAL_COMMUNITY): Payer: Self-pay | Admitting: Gastroenterology

## 2023-04-29 ENCOUNTER — Encounter (INDEPENDENT_AMBULATORY_CARE_PROVIDER_SITE_OTHER): Payer: Self-pay | Admitting: Gastroenterology

## 2023-04-29 DIAGNOSIS — R1010 Upper abdominal pain, unspecified: Secondary | ICD-10-CM | POA: Insufficient documentation

## 2023-05-02 ENCOUNTER — Other Ambulatory Visit (INDEPENDENT_AMBULATORY_CARE_PROVIDER_SITE_OTHER): Payer: Self-pay | Admitting: Gastroenterology

## 2023-05-02 ENCOUNTER — Telehealth (INDEPENDENT_AMBULATORY_CARE_PROVIDER_SITE_OTHER): Payer: Self-pay | Admitting: Gastroenterology

## 2023-05-02 MED ORDER — PROMETHAZINE HCL 12.5 MG PO TABS: 12.5000 mg | ORAL_TABLET | Freq: Three times a day (TID) | ORAL | 1 refills | Status: DC | PRN

## 2023-05-02 NOTE — Telephone Encounter (Signed)
Pt contacted and verbalized understanding.  

## 2023-05-02 NOTE — Telephone Encounter (Signed)
Pt called in and states that he was to call Chelsea at the end of this week to let her know how he is doing. Pt states he is not doing any better. Having same symptoms (nausea, bloating,cramps). No new symptoms. Symptoms are not worsening but not improving. Pt states he has been taking promethazine but doesn't know if it is working. Has enough for today and tomorrow but will not have enough for the weekend or next week. Pt states they will be out of town all next week if he is feeling better.  Please advise. Thank you!

## 2023-05-03 NOTE — Telephone Encounter (Signed)
Referral sent, they will contact patient with apt

## 2023-05-16 DIAGNOSIS — R140 Abdominal distension (gaseous): Secondary | ICD-10-CM | POA: Diagnosis not present

## 2023-05-16 DIAGNOSIS — R109 Unspecified abdominal pain: Secondary | ICD-10-CM | POA: Diagnosis not present

## 2023-05-16 DIAGNOSIS — K63829 Intestinal methanogen overgrowth, unspecified: Secondary | ICD-10-CM | POA: Diagnosis not present

## 2023-05-16 DIAGNOSIS — K638211 Small intestinal bacterial overgrowth, hydrogen-subtype: Secondary | ICD-10-CM | POA: Diagnosis not present

## 2023-05-21 ENCOUNTER — Ambulatory Visit (INDEPENDENT_AMBULATORY_CARE_PROVIDER_SITE_OTHER): Payer: PPO | Admitting: Gastroenterology

## 2023-05-31 ENCOUNTER — Telehealth (INDEPENDENT_AMBULATORY_CARE_PROVIDER_SITE_OTHER): Payer: Self-pay | Admitting: Gastroenterology

## 2023-05-31 ENCOUNTER — Other Ambulatory Visit: Payer: Self-pay | Admitting: Gastroenterology

## 2023-05-31 DIAGNOSIS — K63829 Intestinal methanogen overgrowth, unspecified: Secondary | ICD-10-CM

## 2023-05-31 MED ORDER — RIFAXIMIN 550 MG PO TABS: 550.0000 mg | ORAL_TABLET | Freq: Three times a day (TID) | ORAL | 0 refills | Status: DC

## 2023-05-31 MED ORDER — NEOMYCIN SULFATE 500 MG PO TABS: 500.0000 mg | ORAL_TABLET | Freq: Two times a day (BID) | ORAL | 0 refills | Status: AC

## 2023-05-31 NOTE — Telephone Encounter (Signed)
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  She should follow with Dr. Tasia Catchings as scheduled.  He can increase the dose of promethazine to 2 pills Every 8 hours ( 12.5 mg pills x 2).  thanks

## 2023-05-31 NOTE — Telephone Encounter (Signed)
Pt wife called in asking about results from Hydrogen test done at [facility] about 2 weeks ago. Pt is currently having dry heaves and nauseated. Pt has taking Promethazine and Zofran but has not helped. Wife would like something for over the weekend. Pt has appt on Tuesday with Dr.Ahmed. Please advise. Thank you

## 2023-06-03 ENCOUNTER — Encounter (INDEPENDENT_AMBULATORY_CARE_PROVIDER_SITE_OTHER): Payer: Self-pay

## 2023-06-03 DIAGNOSIS — K638219 Small intestinal bacterial overgrowth, unspecified: Secondary | ICD-10-CM | POA: Insufficient documentation

## 2023-06-03 DIAGNOSIS — K63829 Intestinal methanogen overgrowth, unspecified: Secondary | ICD-10-CM | POA: Insufficient documentation

## 2023-06-03 NOTE — Telephone Encounter (Signed)
Thank you Dr Levon Hedger for taking care of this  Hi Crytsal thanks for submitting PA and I agree with diagnosis above ,

## 2023-06-03 NOTE — Telephone Encounter (Signed)
Since the patient xifaxan was denied, should he wait to get the xifaxan before beginning the Neomycin? Or should he go ahead and take the Neomycin, while we appeal this insurance determination on Xifaxan. Even if [name] is approved patient may not be able to afford if high co pay.

## 2023-06-03 NOTE — Telephone Encounter (Signed)
Your prior authorization request has been denied. Complete E-Appeal Your request for prior authorization was denied, but an Electronic Appeal is available for your patient. Complete the questions in the Appeal section at the bottom of this page to pursue the appeal. For assistance, contact our support team at [phone].   Message from plan: Your request was denied because it is being used for an indication which is not approved or medically-accepted: Intestinal methanogen Overgrowth - Breath test included showing diagnosis. For your reference, the requested drug must meet the follo

## 2023-06-03 NOTE — Telephone Encounter (Signed)
Since the patient xifaxan was denied, should he wait to get the xifaxan before beginning the Neomycin? Or should he go ahead and take the Neomycin, while we appeal this insurance determination on Xifaxan. Even if Jeneen Rinks is approved patient may not be able to afford if high co pay.

## 2023-06-03 NOTE — Telephone Encounter (Signed)
Ok, see me tomorrow in clinic and we can do the appeal on Cover My Meds, I think this would be the fasted appeal method. Thanks

## 2023-06-03 NOTE — Telephone Encounter (Signed)
I called and left a message asked that the patient please return call to the office or respond through My Chart.

## 2023-06-03 NOTE — Telephone Encounter (Signed)
I know the patient has a diagnosis of SIBO,but does he also have a diagnoses of IBS D? I seen a note from Dr. Karilyn Cota in the past stating the patient had IBS in general, no specificity of with diarrhea, constipation or mixed. The note also says the patient was treated for IBS with Xifaxan and other antibiotics, which none were helpful. I need Diagnosis as Sibo is not a current indication for Xifaxan, only IBS D, Travelers diarrhea, and Hepatic encephalopathy, and trying to do a prior authorization.Please advise. Thanks,

## 2023-06-03 NOTE — Telephone Encounter (Signed)
Thanks crystal , I will appeal this,

## 2023-06-03 NOTE — Telephone Encounter (Signed)
I used Intestinal Methanogen overgrowth as the diagnosis and submitted to the patient's insurance.

## 2023-06-03 NOTE — Telephone Encounter (Signed)
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  She should follow with Dr. Tasia Catchings as scheduled.  He can increase the dose of promethazine to 2 pills Every 8 hours ( 12.5 mg pills x 2).  thanks

## 2023-06-04 ENCOUNTER — Encounter (INDEPENDENT_AMBULATORY_CARE_PROVIDER_SITE_OTHER): Payer: Self-pay | Admitting: Gastroenterology

## 2023-06-04 ENCOUNTER — Ambulatory Visit (INDEPENDENT_AMBULATORY_CARE_PROVIDER_SITE_OTHER): Payer: PPO | Admitting: Gastroenterology

## 2023-06-04 VITALS — BP 113/75 | HR 90 | Temp 97.8°F | Ht 68.0 in | Wt 185.3 lb

## 2023-06-04 DIAGNOSIS — K63829 Intestinal methanogen overgrowth, unspecified: Secondary | ICD-10-CM | POA: Diagnosis not present

## 2023-06-04 DIAGNOSIS — R140 Abdominal distension (gaseous): Secondary | ICD-10-CM | POA: Diagnosis not present

## 2023-06-04 DIAGNOSIS — R110 Nausea: Secondary | ICD-10-CM

## 2023-06-04 DIAGNOSIS — Z8719 Personal history of other diseases of the digestive system: Secondary | ICD-10-CM

## 2023-06-04 NOTE — Telephone Encounter (Signed)
Patient was seen today by Dr. Tasia Catchings, samples of the Xifaxan 550 mg one po tid for 14 days #42 was given to the patient to complete his antibiotic regimen for SIBO.

## 2023-06-04 NOTE — Patient Instructions (Signed)
It was very nice to meet you today, as dicussed with will plan for the following :  1) rifaximin and Neomycin

## 2023-06-04 NOTE — Progress Notes (Signed)
Chief Complaint  Patient presents with   Results    Follow up on test results. Would like rx for phenergen 25mg  instead of the 12.5mg . 12.5 mg not helping.    HPI:   [name] is a 78 y.o. male with past medical history of Sigmoid colon perforation due to diveticulitis  s/p Hartmann colectomy/colostomy reversal 06/15/2018   hypertension, diabetes A-fib on Eliquis ,? Alpha -Gal,interstitial lung disease on prednisone, CellCept presenting today for follow up of nausea and abdominal pain . Patient with recent test positive for Small intestinal methanogenic bacterial overgrowth / SIMO   Patient presented again today with #1 new problem of nausea.  Reports this started few months ago and acute pain by abdominal distention and persistent nausea.  No aggravating or relieving factors.  Patient rarely has responded to Zofran and Phenergan or Reglan.  Patient underwent upper endoscopy and CT abdomen pelvis with IV contrast without any suggestive diagnosis of patient's symptoms.  Recently was referred to [facility] and found to have Small intestinal methanogenic bacterial overgrowth  Patient was seen by Dr. Dionicia Abler in 2012 also for diarrhea at that time we are does not suggest EGD with duodenal biopsy x2.  Two colonoscopies, breath test for small intestine bacterial overgrowth, small-bowel follow-through MR enterography, multiple stool studies were negative and patient at that time did not responded to antispasmodics, antidiarrheals, Xifaxan.   EGD as outlined below, biopsies negative for H. pylori or any changes of malignancy in the stomach.  1 hyperplastic polyp noted in the stomach with recommendations to repeat EGD in a year.  CT A/P with contrast done 04/20/2023 with no acute abdominopelvic findings, hepatic cytosis, healing right posterior 11th and left lateral fifth eighth and ninth rib fractures, colonic diverticulosis without acute diverticulitis  Reports history of IBS, has loose stools off  and on, no rectal bleeding or melena. Denies any recent changes in his bowel habits.   He notes a fall about a year ago where he cracked some ribs. No recent falls or injuries that he is aware of.   Last Colonoscopy: 12/2018 diverticulosis in proximal sigmoid colon, distal sigmoid colon and descending colon, 1 small polyp in distal descending colon, entire examined portion of ileum was normal, and sigmoid colostomy characterized by inflammation, examination otherwise normal Last Endoscopy: 04/18/2023- Normal esophagus.                           - A single gastroesophageal junction polyp.                            Resected and retrieved.                           - Multiple gastric polyps. Resected and retrieved.                           - Gastritis. Biopsied. [place] protocol                           - Normal duodenal bulb and second portion of the                            duodenum.   Past Medical History:  Diagnosis Date   Allergy to alpha-gal  2015; has been able to re-introduce red meat for the past 2 1/2 years without reaction (as of 01/21/19)   Arthritis    Chest pain    a. 03/2017: echo showing EF of 60-65%, no regional WMA or significant valve abnormalities. b. 03/2016: NST with no evidence of ischemia.    Complication of anesthesia    "anaphylactic" reaction to Xylocaine > 10 years ago; reported later recieved marcaine without issue   Difficult intubation 02/19/2018   No difficulty 01/26/19 with Mac and 3 or 11/10/19 with Glidescope   Digestive problems    on Prednisone prn for this issue- diarrhea   Dyspnea    pulmonary fibrosis   Dysrhythmia    irregular due to Myocarditis- takes Atenolol, Norvasc   GERD (gastroesophageal reflux disease)    H/O viral myocarditis    25 years ago   Head injury, closed, with concussion    Breif LOC   Hyperlipidemia    Hypertension    Inguinal hernia    Mild mitral valve prolapse    per Dr Landry Dyke notes   PAF (paroxysmal atrial  fibrillation) (HCC)    a. initially occuring in 02/2016. b. recurrent in 07/2016. Placed on Eliquis   Polymyalgia rheumatica (HCC)    PONV (postoperative nausea and vomiting)    Pre-diabetes    elevated due to high dose of prednsione for back no longer elevated   Pulmonary fibrosis (HCC)    Sleep apnea    no cpap mild    Past Surgical History:  Procedure Laterality Date   ANTERIOR CERVICAL DECOMP/DISCECTOMY FUSION N/A 11/10/2019   Procedure: Cervical Three-Four Anterior cervical decompression/discectomy/fusion;  Surgeon: Barnett Abu, MD;  Location: [facility] OR;  Service: Neurosurgery;  Laterality: N/A;  Cervical Three-Four Anterior cervical decompression/discectomy/fusion   apendectomy  1965   APPENDECTOMY     APPLICATION OF ROBOTIC ASSISTANCE FOR SPINAL PROCEDURE N/A 12/22/2020   Procedure: APPLICATION OF ROBOTIC ASSISTANCE FOR SPINAL PROCEDURE;  Surgeon: Barnett Abu, MD;  Location: [facility] OR;  Service: Neurosurgery;  Laterality: N/A;   BACK SURGERY     BIOPSY  04/18/2023   Procedure: BIOPSY;  Surgeon: Franky Macho, MD;  Location: [facility];  Service: Endoscopy;;   bone spur Bilateral 1999   feet   CARDIAC CATHETERIZATION  2001   CHOLECYSTECTOMY  2004   COLON RESECTION N/A 06/15/2018   Procedure: SIGMOID COLON RESECTION;  Surgeon: Griselda Miner, MD;  Location: [facility] ORS;  Service: General;  Laterality: N/A;   COLONOSCOPY     COLOSTOMY N/A 06/15/2018   Procedure: COLOSTOMY;  Surgeon: Griselda Miner, MD;  Location: [facility] ORS;  Service: General;  Laterality: N/A;   COLOSTOMY TAKEDOWN N/A 01/26/2019   Procedure: LAPAROSCOPIC ASSISTED COLOSTOMY TAKEDOWN;  Surgeon: Griselda Miner, MD;  Location: [facility] OR;  Service: General;  Laterality: N/A;   DG GREAT TOE LEFT FOOT Bilateral    bone spurs   ESOPHAGOGASTRODUODENOSCOPY (EGD) WITH PROPOFOL N/A 04/18/2023   Procedure: ESOPHAGOGASTRODUODENOSCOPY (EGD) WITH PROPOFOL;  Surgeon: Franky Macho, MD;  Location: [facility];  Service: Endoscopy;   Laterality: N/A;  2:05PM;ASA 1-2   EYE SURGERY Bilateral    cataract removal   LAPAROSCOPIC LYSIS OF ADHESIONS N/A 01/26/2019   Procedure: LAPAROSCOPIC LYSIS OF ADHESIONS;  Surgeon: Griselda Miner, MD;  Location: [facility] OR;  Service: General;  Laterality: N/A;   LAPAROTOMY N/A 06/15/2018   Procedure: EXPLORATORY LAPAROTOMY;  Surgeon: Griselda Miner, MD;  Location: [facility];  Service: General;  Laterality: N/A;  LUMBAR LAMINECTOMY/ DECOMPRESSION WITH MET-RX Right 05/05/2013   Procedure: Right Lumbar three-four Extraforaminal Microdiskectomy with Metrex;  Surgeon: Barnett Abu, MD;  Location: [facility];  Service: Neurosurgery;  Laterality: Right;  Right Lumbar three-four Extraforaminal Microdiskectomy with Metrex   LUNG BIOPSY Right 02/19/2018   Procedure: LUNG BIOPSY;  Surgeon: Loreli Slot, MD;  Location: [facility] OR;  Service: Thoracic;  Laterality: Right;   POSTERIOR LUMBAR FUSION 4 LEVEL N/A 12/22/2020   Procedure: Lumbar one-two Posterior lumbar interbody fusion with stabilization from Thoracic ten to Lumbar one with robotic screw placement;  Surgeon: Barnett Abu, MD;  Location: [facility] OR;  Service: Neurosurgery;  Laterality: N/A;  RM 20   REVERSE SHOULDER ARTHROPLASTY Right 07/11/2022   Procedure: REVERSE SHOULDER ARTHROPLASTY;  Surgeon: Bjorn Pippin, MD;  Location: [facility];  Service: Orthopedics;  Laterality: Right;   SHOULDER OPEN ROTATOR CUFF REPAIR Bilateral 2001   TONSILLECTOMY     VIDEO ASSISTED THORACOSCOPY Right 02/19/2018   Procedure: VIDEO ASSISTED THORACOSCOPY;  Surgeon: Loreli Slot, MD;  Location: [facility] OR;  Service: Thoracic;  Laterality: Right;    Current Outpatient Medications  Medication Sig Dispense Refill   acetaminophen (ACETAMINOPHEN 8 HOUR) 650 MG CR tablet Take 650 mg by mouth every 8 (eight) hours as needed for pain.     cetirizine (ZYRTEC) 10 MG tablet Take 10 mg by mouth daily as needed for allergies.      Cholecalciferol (DIALYVITE VITAMIN D 5000) 125 MCG  (5000 UT) capsule Take 5,000 Units by mouth daily.     diltiazem (CARDIZEM CD) 120 MG 24 hr capsule TAKE ONE CAPSULE BY MOUTH DAILY 90 capsule 3   ELIQUIS 5 MG TABS tablet TAKE ONE TABLET (5MG  TOTAL) BY MOUTH TWODAILY (Patient taking differently: Take 5 mg by mouth 2 (two) times daily.) 180 tablet 1   ezetimibe (ZETIA) 10 MG tablet TAKE ONE (1) TABLET BY MOUTH EVERY DAY 90 tablet 3   fluticasone (FLONASE) 50 MCG/ACT nasal spray Place 2 sprays into both nostrils daily as needed for allergies or rhinitis.     hydroxychloroquine (PLAQUENIL) 200 MG tablet Take 200 mg by mouth 2 (two) times daily.     metoprolol tartrate (LOPRESSOR) 25 MG tablet TAKE ONE TABLET BY MOUTH TWICE A DAY 180 tablet 3   neomycin (MYCIFRADIN) 500 MG tablet Take 1 tablet (500 mg total) by mouth 2 (two) times daily for 14 days. 28 tablet 0   omeprazole (PRILOSEC) 40 MG capsule Take 1 capsule (40 mg total) by mouth daily. 60 capsule 1   predniSONE (DELTASONE) 5 MG tablet Take 5 mg by mouth daily.      promethazine (PHENERGAN) 12.5 MG tablet Take 1 tablet (12.5 mg total) by mouth every 8 (eight) hours as needed for nausea or vomiting. 30 tablet 1   rosuvastatin (CRESTOR) 10 MG tablet Take 0.5 tablets (5 mg total) by mouth once a week. Pt needs to make appt with provider for further refills - 1st attempt 10 tablet 0   tamsulosin (FLOMAX) 0.4 MG CAPS capsule Take 0.4 mg by mouth 2 (two) times daily.      rifaximin (XIFAXAN) 550 MG TABS tablet Take 1 tablet (550 mg total) by mouth 3 (three) times daily. (Patient not taking: Reported on 06/04/2023) 42 tablet 0   No current facility-administered medications for this visit.    Allergies as of 06/04/2023 - Review Complete 06/04/2023  Allergen Reaction Noted   Xylocaine [lidocaine] Anaphylaxis 09/25/2016   Codeine Nausea And Vomiting 01/24/2015  Statins Other (See Comments) 09/25/2016   Aldactone [spironolactone]  12/08/2020   Alpha-gal  12/23/2020   Pirfenidone Other (See Comments)  02/01/2022   Percocet [oxycodone-acetaminophen] Itching 02/02/2015    Family History  Problem Relation Age of Onset   Rheum arthritis Mother    Thyroid cancer Mother    Heart disease Father    Asthma Father    Thyroid cancer Sister    Melanoma Brother     Social History   Socioeconomic History   Marital status: Married    Spouse name: Not on file   Number of children: Not on file   Years of education: Not on file   Highest education level: Not on file  Occupational History   Not on file  Tobacco Use   Smoking status: Never   Smokeless tobacco: Never  Vaping Use   Vaping status: Never Used  Substance and Sexual Activity   Alcohol use: Yes    Alcohol/week: 21.0 standard drinks of alcohol    Types: 21 Glasses of wine per week    Comment: 2/3 glasses wine in evening   Drug use: No   Sexual activity: Yes    Birth control/protection: Other-see comments    Comment: old age  Other Topics Concern   Not on file  Social History Narrative   Not on file   Social Drivers of Health   Financial Resource Strain: Not on file  Food Insecurity: Not on file  Transportation Needs: Not on file  Physical Activity: Not on file  Stress: Not on file  Social Connections: Not on file    Review of systems General: negative for malaise, night sweats, fever, chills, weight loss Neck: Negative for lumps, goiter, pain and significant neck swelling Resp: Negative for cough, wheezing, dyspnea at rest CV: Negative for chest pain, leg swelling, palpitations, orthopnea GI: denies melena, hematochezia,  vomiting, diarrhea, constipation, dysphagia, odyonophagia, early satiety or unintentional weight loss. +abdominal bloating +upper abdominal pain +nausea MSK: Negative for joint pain or swelling, back pain, and muscle pain. Derm: Negative for itching or rash Psych: Denies depression, anxiety, memory loss, confusion. No homicidal or suicidal ideation.  Heme: Negative for prolonged bleeding,  bruising easily, and swollen nodes. Endocrine: Negative for cold or heat intolerance, polyuria, polydipsia and goiter. Neuro: negative for tremor, gait imbalance, syncope and seizures. The remainder of the review of systems is noncontributory.  Physical Exam: BP 113/75   Pulse 90   Temp 97.8 F (36.6 C) (Oral)   Ht 5\' 8"  (1.727 m)   Wt 185 lb 4.8 oz (84.1 kg)   BMI 28.17 kg/m  General:   Alert and oriented. No distress noted. Pleasant and cooperative.  Head:  Normocephalic and atraumatic. Eyes:  Conjuctiva clear without scleral icterus. Mouth:  Oral mucosa pink and moist. Good dentition. No lesions. Heart: Normal rate and rhythm, s1 and s2 heart sounds present.  Lungs: Clear lung sounds in all lobes. Respirations equal and unlabored. Abdomen:  +BS, soft,  and non-distended. Non tender  No rebound or guarding. No HSM or masses noted. Derm: No palmar erythema or jaundice Msk:  Symmetrical without gross deformities. Normal posture. Extremities:  Without edema. Neurologic:  Alert and  oriented x4 Psych:  Alert and cooperative. Normal mood and affect.  Invalid input(s): "6 MONTHS"   ASSESSMENT:  [name] is a 78 y.o. male with past medical history of Sigmoid colon perforation due to diveticulitis  s/p Hartmann colectomy/colostomy reversal 06/15/2018   hypertension, diabetes A-fib on  Eliquis ,? Alpha -Gal,interstitial lung disease on prednisone, CellCept presenting today for follow up of nausea and abdominal pain . 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 No radiation of pain. Presentation not consistent with gallbladder etiology.   Notably his A1c earlier this year was 5.2. low suspicion  for gastroparesis but will need to rule this if not repsonding to [facility] treatement   Patient does have a history of IBS-D previously worked up by Dr. Karilyn Cota   Patient had stopped CellCept in 2020 which is known to cause abdominal pain nausea and other GI disturbances  He has been on prednisone chronically which can also attribute to symptoms  Would avoid Phenergan for now as patient is on hydroxychloroquine and both can cause QT prolongation  Polypharmacy remains the main issue here given patient is on prednisone and hydroxychloroquine (for polymyalgia rheumatica )and many other medication  PLAN:   Neomycin 500 mg twice daily and rifaximin 550 3 times daily all for 14 days Rifaximin samples were given today If no response to antibiotics next would be to obtain CT head and gastric emptying study Can stop omeprazole and Carafate ;   Repeat colonoscopy  12/2023  All questions were answered, patient verbalized understanding and is in agreement with plan as outlined above.

## 2023-06-05 NOTE — Telephone Encounter (Signed)
Dr. Tasia Catchings saw patient in clinic on 06/04/2023, and made patient aware of how to take with Xifaxan, that patient was given samples of at the visit on 06/04/2023, during ov.

## 2023-06-11 DIAGNOSIS — M19011 Primary osteoarthritis, right shoulder: Secondary | ICD-10-CM | POA: Diagnosis not present

## 2023-06-12 ENCOUNTER — Telehealth (INDEPENDENT_AMBULATORY_CARE_PROVIDER_SITE_OTHER): Payer: Self-pay

## 2023-06-12 DIAGNOSIS — K591 Functional diarrhea: Secondary | ICD-10-CM

## 2023-06-12 NOTE — Telephone Encounter (Signed)
Patient made aware, Per Dr. Tasia Catchings, Antibiotics are known to cause diarrhea and is usually self limited .  I would recommend completing course of rifaximin and neomycin   Although just to be sure I will order stool studies for C. difficile to be checked.  Patient states understanding and per Verbal from Dr. Tasia Catchings if patient must take antidiarrheal take one to two per day that is it.   Patient will go to [facility] [employer] and perform the stool studies. He states understanding that he must finish antibiotic course.

## 2023-06-12 NOTE — Telephone Encounter (Signed)
Antibiotics are known to cause diarrhea and is usually self limited .  I would recommend completing course of rifaximin and neomycin  Although just to be sure I will order stool studies for C. difficile to be checked

## 2023-06-12 NOTE — Telephone Encounter (Signed)
Patient called and states he has started having diarrhea, in the last four days, since beginning the Xifaxan and the Neomycin to treat his SIMO. He is having around 3-4 loose stools per day for the last four days. He denies any abdominal pain, fevers, no sight of dark or bloody stools. He says he is feeling nauseous and weakness and has issues with fecal urgency. He has some diphenoxylate 2.5 mg he says he has taken the last two morning which has helped.   He would like to know if this is common, while on these antibiotics?  Does he continue treatment of antibiotics until finished?  If so does he need to take the antidiarrheal while on these? Please advise,   Thanks

## 2023-06-19 ENCOUNTER — Other Ambulatory Visit: Payer: PPO

## 2023-06-19 ENCOUNTER — Ambulatory Visit (INDEPENDENT_AMBULATORY_CARE_PROVIDER_SITE_OTHER): Payer: PPO | Admitting: Gastroenterology

## 2023-06-19 ENCOUNTER — Encounter (INDEPENDENT_AMBULATORY_CARE_PROVIDER_SITE_OTHER): Payer: Self-pay | Admitting: Gastroenterology

## 2023-06-19 VITALS — BP 107/69 | HR 91 | Temp 97.5°F | Ht 68.0 in | Wt 185.1 lb

## 2023-06-19 DIAGNOSIS — K63829 Intestinal methanogen overgrowth, unspecified: Secondary | ICD-10-CM

## 2023-06-19 DIAGNOSIS — Z8719 Personal history of other diseases of the digestive system: Secondary | ICD-10-CM | POA: Diagnosis not present

## 2023-06-19 DIAGNOSIS — K591 Functional diarrhea: Secondary | ICD-10-CM | POA: Insufficient documentation

## 2023-06-19 DIAGNOSIS — K2970 Gastritis, unspecified, without bleeding: Secondary | ICD-10-CM

## 2023-06-19 DIAGNOSIS — R140 Abdominal distension (gaseous): Secondary | ICD-10-CM

## 2023-06-19 NOTE — Progress Notes (Signed)
 No chief complaint on file.  HPI:   [name] is a 78 y.o. male with past medical history of Sigmoid colon perforation due to diveticulitis  s/p Hartmann colectomy/colostomy reversal 06/15/2018   hypertension, diabetes A-fib on Eliquis  ,? Alpha -Gal,interstitial lung disease on prednisone , CellCept  presenting today for follow up of nausea and abdominal pain . Patient with test positive for Small intestinal methanogenic bacterial overgrowth / SIMO . Underwent Neomycin  and Rifaximin  treatment   Today patient is very happy and reports his symptoms have resolved : nausea, abdominal bloating . He completed 14 days of neomycin  and Rifaximin    Previous history :   Patient presented with #1 problem of nausea.  Reports this started few months ago and acute pain by abdominal distention and persistent nausea.  No aggravating or relieving factors.  Patient rarely has responded to Zofran  and Phenergan  or Reglan .  Patient underwent upper endoscopy and CT abdomen pelvis with IV contrast without any suggestive diagnosis of patient's symptoms.  Recently was referred to [facility] and found to have Small intestinal methanogenic bacterial overgrowth  Patient was seen by Dr. Bill in 2012 also for diarrhea at that time we are does not suggest EGD with duodenal biopsy x2.  Two colonoscopies, breath test for small intestine bacterial overgrowth, small-bowel follow-through MR enterography, multiple stool studies were negative and patient at that time did not responded to antispasmodics, antidiarrheals, Xifaxan .   EGD as outlined below, biopsies negative for H. pylori or any changes of malignancy in the stomach.  1 hyperplastic polyp noted in the stomach with recommendations to repeat EGD in a year.  CT A/P with contrast done 04/20/2023 with no acute abdominopelvic findings, hepatic cytosis, healing right posterior 11th and left lateral fifth eighth and ninth rib fractures, colonic diverticulosis without acute  diverticulitis  Reports history of IBS, has loose stools off and on, no rectal bleeding or melena. Denies any recent changes in his bowel habits.   He notes a fall about a year ago where he cracked some ribs. No recent falls or injuries that he is aware of.   Last Colonoscopy: 12/2018 diverticulosis in proximal sigmoid colon, distal sigmoid colon and descending colon, 1 small polyp in distal descending colon, entire examined portion of ileum was normal, and sigmoid colostomy characterized by inflammation, examination otherwise normal Last Endoscopy: 04/18/2023- Normal esophagus.                           - A single gastroesophageal junction polyp.                            Resected and retrieved.                           - Multiple gastric polyps. Resected and retrieved.                           - Gastritis. Biopsied. [place] protocol                           - Normal duodenal bulb and second portion of the                            duodenum.   Past Medical History:  Diagnosis Date  Allergy to alpha-gal    2015; has been able to re-introduce red meat for the past 2 1/2 years without reaction (as of 01/21/19)   Arthritis    Chest pain    a. 03/2017: echo showing EF of 60-65%, no regional WMA or significant valve abnormalities. b. 03/2016: NST with no evidence of ischemia.    Complication of anesthesia    anaphylactic reaction to Xylocaine  > 10 years ago; reported later recieved marcaine  without issue   Difficult intubation 02/19/2018   No difficulty 01/26/19 with Mac and 3 or 11/10/19 with Glidescope   Digestive problems    on Prednisone  prn for this issue- diarrhea   Dyspnea    pulmonary fibrosis   Dysrhythmia    irregular due to Myocarditis- takes Atenolol , Norvasc    GERD (gastroesophageal reflux disease)    H/O viral myocarditis    25 years ago   Head injury, closed, with concussion    Breif LOC   Hyperlipidemia    Hypertension    Inguinal hernia    Mild mitral valve  prolapse    per Dr Joesphine notes   PAF (paroxysmal atrial fibrillation) (HCC)    a. initially occuring in 02/2016. b. recurrent in 07/2016. Placed on Eliquis    Polymyalgia rheumatica (HCC)    PONV (postoperative nausea and vomiting)    Pre-diabetes    elevated due to high dose of prednsione for back no longer elevated   Pulmonary fibrosis (HCC)    Sleep apnea    no cpap mild    Past Surgical History:  Procedure Laterality Date   ANTERIOR CERVICAL DECOMP/DISCECTOMY FUSION N/A 11/10/2019   Procedure: Cervical Three-Four Anterior cervical decompression/discectomy/fusion;  Surgeon: Colon Shove, MD;  Location: [facility] OR;  Service: Neurosurgery;  Laterality: N/A;  Cervical Three-Four Anterior cervical decompression/discectomy/fusion   apendectomy  1965   APPENDECTOMY     APPLICATION OF ROBOTIC ASSISTANCE FOR SPINAL PROCEDURE N/A 12/22/2020   Procedure: APPLICATION OF ROBOTIC ASSISTANCE FOR SPINAL PROCEDURE;  Surgeon: Colon Shove, MD;  Location: [facility] OR;  Service: Neurosurgery;  Laterality: N/A;   BACK SURGERY     BIOPSY  04/18/2023   Procedure: BIOPSY;  Surgeon: Cinderella Deatrice FALCON, MD;  Location: [facility];  Service: Endoscopy;;   bone spur Bilateral 1999   feet   CARDIAC CATHETERIZATION  2001   CHOLECYSTECTOMY  2004   COLON RESECTION N/A 06/15/2018   Procedure: SIGMOID COLON RESECTION;  Surgeon: Curvin Deward MOULD, MD;  Location: [facility] ORS;  Service: General;  Laterality: N/A;   COLONOSCOPY     COLOSTOMY N/A 06/15/2018   Procedure: COLOSTOMY;  Surgeon: Curvin Deward MOULD, MD;  Location: [facility] ORS;  Service: General;  Laterality: N/A;   COLOSTOMY TAKEDOWN N/A 01/26/2019   Procedure: LAPAROSCOPIC ASSISTED COLOSTOMY TAKEDOWN;  Surgeon: Curvin Deward MOULD, MD;  Location: [facility] OR;  Service: General;  Laterality: N/A;   DG GREAT TOE LEFT FOOT Bilateral    bone spurs   ESOPHAGOGASTRODUODENOSCOPY (EGD) WITH PROPOFOL  N/A 04/18/2023   Procedure: ESOPHAGOGASTRODUODENOSCOPY (EGD) WITH PROPOFOL ;  Surgeon: Cinderella Deatrice FALCON, MD;  Location: [facility];  Service: Endoscopy;  Laterality: N/A;  2:05PM;ASA 1-2   EYE SURGERY Bilateral    cataract removal   LAPAROSCOPIC LYSIS OF ADHESIONS N/A 01/26/2019   Procedure: LAPAROSCOPIC LYSIS OF ADHESIONS;  Surgeon: Curvin Deward MOULD, MD;  Location: [facility] OR;  Service: General;  Laterality: N/A;   LAPAROTOMY N/A 06/15/2018   Procedure: EXPLORATORY LAPAROTOMY;  Surgeon: Curvin Deward MOULD, MD;  Location: [facility] ORS;  Service: General;  Laterality: N/A;   LUMBAR LAMINECTOMY/ DECOMPRESSION WITH MET-RX Right 05/05/2013   Procedure: Right Lumbar three-four Extraforaminal Microdiskectomy with Metrex;  Surgeon: Victory Gens, MD;  Location: [facility]UR[facility];  Service: Neurosurgery;  Laterality: Right;  Right Lumbar three-four Extraforaminal Microdiskectomy with Metrex   LUNG BIOPSY Right 02/19/2018   Procedure: LUNG BIOPSY;  Surgeon: Kerrin Elspeth BROCKS, MD;  Location: [facility] OR;  Service: Thoracic;  Laterality: Right;   POSTERIOR LUMBAR FUSION 4 LEVEL N/A 12/22/2020   Procedure: Lumbar one-two Posterior lumbar interbody fusion with stabilization from Thoracic ten to Lumbar one with robotic screw placement;  Surgeon: Gens Victory, MD;  Location: [facility] OR;  Service: Neurosurgery;  Laterality: N/A;  RM 20   REVERSE SHOULDER ARTHROPLASTY Right 07/11/2022   Procedure: REVERSE SHOULDER ARTHROPLASTY;  Surgeon: Cristy Bonner DASEN, MD;  Location: [facility] ORS;  Service: Orthopedics;  Laterality: Right;   SHOULDER OPEN ROTATOR CUFF REPAIR Bilateral 2001   TONSILLECTOMY     VIDEO ASSISTED THORACOSCOPY Right 02/19/2018   Procedure: VIDEO ASSISTED THORACOSCOPY;  Surgeon: Kerrin Elspeth BROCKS, MD;  Location: [facility] OR;  Service: Thoracic;  Laterality: Right;    Current Outpatient Medications  Medication Sig Dispense Refill   acetaminophen  (ACETAMINOPHEN  8 HOUR) 650 MG CR tablet Take 650 mg by mouth every 8 (eight) hours as needed for pain.     cetirizine (ZYRTEC) 10 MG tablet Take 10 mg by mouth daily as needed for  allergies.      Cholecalciferol (DIALYVITE VITAMIN D  5000) 125 MCG (5000 UT) capsule Take 5,000 Units by mouth daily.     diltiazem  (CARDIZEM  CD) 120 MG 24 hr capsule TAKE ONE CAPSULE BY MOUTH DAILY 90 capsule 3   ELIQUIS  5 MG TABS tablet TAKE ONE TABLET (5MG  TOTAL) BY MOUTH TWODAILY (Patient taking differently: Take 5 mg by mouth 2 (two) times daily.) 180 tablet 1   ezetimibe  (ZETIA ) 10 MG tablet TAKE ONE (1) TABLET BY MOUTH EVERY DAY 90 tablet 3   fluticasone  (FLONASE ) 50 MCG/ACT nasal spray Place 2 sprays into both nostrils daily as needed for allergies or rhinitis.     hydroxychloroquine  (PLAQUENIL ) 200 MG tablet Take 200 mg by mouth 2 (two) times daily.     metoprolol  tartrate (LOPRESSOR ) 25 MG tablet TAKE ONE TABLET BY MOUTH TWICE A DAY 180 tablet 3   omeprazole  (PRILOSEC) 40 MG capsule Take 1 capsule (40 mg total) by mouth daily. 60 capsule 1   predniSONE  (DELTASONE ) 5 MG tablet Take 5 mg by mouth daily.      promethazine  (PHENERGAN ) 12.5 MG tablet Take 1 tablet (12.5 mg total) by mouth every 8 (eight) hours as needed for nausea or vomiting. 30 tablet 1   rifaximin  (XIFAXAN ) 550 MG TABS tablet Take 1 tablet (550 mg total) by mouth 3 (three) times daily. (Patient not taking: Reported on 06/04/2023) 42 tablet 0   rosuvastatin  (CRESTOR ) 10 MG tablet Take 0.5 tablets (5 mg total) by mouth once a week. Pt needs to make appt with provider for further refills - 1st attempt 10 tablet 0   tamsulosin  (FLOMAX ) 0.4 MG CAPS capsule Take 0.4 mg by mouth 2 (two) times daily.      No current facility-administered medications for this visit.    Allergies as of 06/19/2023 - Review Complete 06/04/2023  Allergen Reaction Noted   Xylocaine  [lidocaine ] Anaphylaxis 09/25/2016   Codeine Nausea And Vomiting 01/24/2015   Statins Other (See Comments) 09/25/2016   Aldactone  [spironolactone ]  12/08/2020   Alpha-gal  12/23/2020  Pirfenidone  Other (See Comments) 02/01/2022   Percocet [oxycodone -acetaminophen ]  Itching 02/02/2015    Family History  Problem Relation Age of Onset   Rheum arthritis Mother    Thyroid  cancer Mother    Heart disease Father    Asthma Father    Thyroid  cancer Sister    Melanoma Brother     Social History   Socioeconomic History   Marital status: Married    Spouse name: Not on file   Number of children: Not on file   Years of education: Not on file   Highest education level: Not on file  Occupational History   Not on file  Tobacco Use   Smoking status: Never   Smokeless tobacco: Never  Vaping Use   Vaping status: Never Used  Substance and Sexual Activity   Alcohol use: Yes    Alcohol/week: 21.0 standard drinks of alcohol    Types: 21 Glasses of wine per week    Comment: 2/3 glasses wine in evening   Drug use: No   Sexual activity: Yes    Birth control/protection: Other-see comments    Comment: old age  Other Topics Concern   Not on file  Social History Narrative   Not on file   Social Drivers of Health   Financial Resource Strain: Not on file  Food Insecurity: Not on file  Transportation Needs: Not on file  Physical Activity: Not on file  Stress: Not on file  Social Connections: Not on file    Review of systems General: negative for malaise, night sweats, fever, chills, weight loss Neck: Negative for lumps, goiter, pain and significant neck swelling Resp: Negative for cough, wheezing, dyspnea at rest CV: Negative for chest pain, leg swelling, palpitations, orthopnea GI: denies melena, hematochezia,  vomiting, diarrhea, constipation, dysphagia, odyonophagia, early satiety or unintentional weight loss. +abdominal bloating +upper abdominal pain +nausea MSK: Negative for joint pain or swelling, back pain, and muscle pain. Derm: Negative for itching or rash Psych: Denies depression, anxiety, memory loss, confusion. No homicidal or suicidal ideation.  Heme: Negative for prolonged bleeding, bruising easily, and swollen nodes. Endocrine:  Negative for cold or heat intolerance, polyuria, polydipsia and goiter. Neuro: negative for tremor, gait imbalance, syncope and seizures. The remainder of the review of systems is noncontributory.  Physical Exam: There were no vitals taken for this visit. General:   Alert and oriented. No distress noted. Pleasant and cooperative.  Head:  Normocephalic and atraumatic. Eyes:  Conjuctiva clear without scleral icterus. Mouth:  Oral mucosa pink and moist. Good dentition. No lesions. Heart: Normal rate and rhythm, s1 and s2 heart sounds present.  Lungs: Clear lung sounds in all lobes. Respirations equal and unlabored. Abdomen:  +BS, soft,  and non-distended. Non tender  No rebound or guarding. No HSM or masses noted. Derm: No palmar erythema or jaundice Msk:  Symmetrical without gross deformities. Normal posture. Extremities:  Without edema. Neurologic:  Alert and  oriented x4 Psych:  Alert and cooperative. Normal mood and affect.  Invalid input(s): 6 MONTHS   ASSESSMENT:  [name] is a 78 y.o. male with past medical history of Sigmoid colon perforation due to diveticulitis  s/p Hartmann colectomy/colostomy reversal 06/15/2018   hypertension, diabetes A-fib on Eliquis  ,? Alpha -Gal,interstitial lung disease on prednisone , CellCept  presenting today for follow up of nausea and abdominal pain . Patient with recent test positive for Small intestinal methanogenic bacterial overgrowth / SIMO Underwent Neomycin  and Rifaximin  treatment . Patient with resolution of his symptoms   #  IBS-D #SIMO/SIBO treated with neomycin  and rifaximin   #Intractable nausea-resolved   Etiology of nausea/bloating/abdominal pain likely due to SIMO as he responded to antibiotics . EGD as above with findings of gastritis, CT A/P without acute findings to explain patient's symptoms. Presentation not consistent with gallbladder etiology.   Notably his A1c earlier this year was 5.2. low suspicion for gastroparesis    Patient does have a history of IBS-D previously worked up by Dr. Golda   Patient had stopped CellCept  in 2020 which is known to cause abdominal pain nausea and other GI disturbances  He has been on prednisone  chronically which can also attribute to symptoms  Would avoid Phenergan  for now as patient is on hydroxychloroquine  and both can cause QT prolongation  Polypharmacy remains the main issue here given patient is on prednisone  and hydroxychloroquine  (for polymyalgia rheumatica )and many other medication  Neomycin  500 mg twice daily and rifaximin  550 3 times daily all for 14 days- completed   Risk factor for this patient having SIBO are prior bowel surgery, elderly, and IBS.      May need repeat course of neomycin  /rifaxmin in future if symptoms reoccur  Low FODMAP diet   Avoid PPI as gastric acid can neutralized ingested bacteria  Repeat colonoscopy  12/2023 for surveillance purposes  All questions were answered, patient verbalized understanding and is in agreement with plan as outlined above.

## 2023-06-19 NOTE — Patient Instructions (Signed)
 It was very nice to meet you today, as dicussed with will plan for the following :  1) Low FODMAP diet  2) Colonoscopy 12/2023

## 2023-06-21 ENCOUNTER — Ambulatory Visit
Admission: RE | Admit: 2023-06-21 | Discharge: 2023-06-21 | Disposition: A | Discharge status: Home / Self Care | Payer: PPO | Source: Ambulatory Visit | Attending: Internal Medicine | Admitting: Internal Medicine

## 2023-06-21 DIAGNOSIS — I700 Atherosclerosis of aorta: Secondary | ICD-10-CM | POA: Diagnosis not present

## 2023-06-21 DIAGNOSIS — K760 Fatty (change of) liver, not elsewhere classified: Secondary | ICD-10-CM | POA: Diagnosis not present

## 2023-06-21 DIAGNOSIS — J849 Interstitial pulmonary disease, unspecified: Secondary | ICD-10-CM

## 2023-06-21 DIAGNOSIS — J8410 Pulmonary fibrosis, unspecified: Secondary | ICD-10-CM | POA: Diagnosis not present

## 2023-06-24 ENCOUNTER — Other Ambulatory Visit: Payer: Self-pay | Admitting: Cardiovascular Disease

## 2023-07-02 ENCOUNTER — Ambulatory Visit (INDEPENDENT_AMBULATORY_CARE_PROVIDER_SITE_OTHER): Payer: PPO | Admitting: Internal Medicine

## 2023-07-02 ENCOUNTER — Ambulatory Visit: Payer: PPO | Admitting: Internal Medicine

## 2023-07-02 ENCOUNTER — Encounter: Payer: Self-pay | Admitting: Internal Medicine

## 2023-07-02 VITALS — BP 124/80 | HR 78 | Ht 68.0 in | Wt 185.0 lb

## 2023-07-02 DIAGNOSIS — J849 Interstitial pulmonary disease, unspecified: Secondary | ICD-10-CM

## 2023-07-02 LAB — PULMONARY FUNCTION TEST
DL/VA % pred: 79 %
DL/VA: 3.17 ml/min/mmHg/L
DLCO cor % pred: 63 %
DLCO cor: 15.03 ml/min/mmHg
DLCO unc % pred: 63 %
DLCO unc: 15.03 ml/min/mmHg
FEF 25-75 Pre: 3.19 L/s
FEF2575-%Pred-Pre: 156 %
FEV1-%Pred-Pre: 98 %
FEV1-Pre: 2.78 L
FEV1FVC-%Pred-Pre: 118 %
FEV6-%Pred-Pre: 88 %
FEV6-Pre: 3.23 L
FEV6FVC-%Pred-Pre: 106 %
FVC-%Pred-Pre: 82 %
FVC-Pre: 3.23 L
Pre FEV1/FVC ratio: 86 %
Pre FEV6/FVC Ratio: 100 %

## 2023-07-02 NOTE — Patient Instructions (Addendum)
ILD (interstitial lung disease) (HCC)    -Clinically  you have NON-IPF variety of fibrosis -PFT , symbomt sand CT stable x 18 months (though worse since 2022) - You are currently on supportive care due to pirfenidone and nintedanib and CellCept intolerance   Plan - repeat PFT in 6 monts   Follow-up - -Do spirometry and DLCO in 6 months -Return to see Dr. Marchelle Gearing 6 months or sooner if needed - 15 min visit  - symtoms score and walk test at followup

## 2023-07-02 NOTE — Progress Notes (Signed)
Synopsis: Referred in November 2017 for evaluation of shortness of breath.  Found to have diffuse parenchymal lung disease of undetermined cause. An open lung biopsy performed on 02/19/2018 showed an early fibrotic process suggestive of UIP.  He only smoked cigarettes for about 3 to 6 months when he was a teenager.  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    OCt 2019   Mr. Isidore returns to clinic today to discuss the results from his recent open lung biopsy which showed early fibrotic changes suggestive of UIP as well as emphysema.  He tells me today that he is slowly feeling better but he does still have some fatigue and shortness of breath.  He has not been exercising much lately.  He tried to go fishing yesterday and he says that any walking was quite difficult for him.  He says that he is not coughing too much right now.  He is taking prednisone still as directed by her clinic earlier this month and he is wondering what to do with that.  He is still taking a bronchodilator.  He tells me today that he only smokes cigarettes for about 3 to 6 months when he was a teenager.  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   OV March 2020 [name] has returned to clinic today to see me.  He recently was hospitalized for diverticulitis, perforated, requiring emergent surgery. He says that his wound is slowly healing. He resumed CellCept and prednisone per my recommendation. His wife says that his wound is healing fairly well.  His shortness of breath is at baseline.  No recent pneumonia or bronchitis.  He is taking his nintedanib.   PATIENT ID: Jimmy Footman Veazey GENDER: male DOB: 1946/04/17, MRN: [id number]  Chief Complaint  Patient presents with   Follow-up    Patient has quit taking the [facility] and has felt a lot  better since then. Patient was taking Stiolto but got thrush and took some Nystatin and it resolved it. Patient needs back surgery in about 3-4 months.      OV 04/16/2019: Mr. Innocent is a 78 year old gentleman who presents for follow-up of NSIP.  He has had worse symptoms since interrupting his CellCept and nintedanib for his colectomy reversal.  He had bleeding when he initially restarted these medicines a few weeks postop, which required stopping them again.  He restarted his nintedanib last week and CellCept with Bactrim about 2 weeks prior.  He has not had any additional bleeding.  No abdominal pain.  He feels better than he did before surgery.  He still has mild dyspnea, not worse than his last visit.  He notices it most when he is leaning over.  No other complaints.  His PCP did his 70-month lab work earlier this week, and he will call the office to request they send [place] results.   OV 03/19/2019: Mr. Judice is a 78 year old gentleman with history of ILD to NSIP and polymyalgia rheumatica on chronic immunosuppression who presents for follow-up.  He underwent colostomy reversal on 01/26/2019, for which his CellCept plus Bactrim and nintedanib were held due to concern for potential surgical complications.  Several weeks following surgery his medications were restarted, and within a day or 2 he developed rectal bleeding.  This continued for several days until he notified  us, and we instructed him to stop.  Within the next day or 2 the bleeding has stopped.  He notices that his breathing has started to get a little bit worse over the last few weeks, and he would like to restart his medications.  He feels like he cannot exert himself due to his mild dyspnea.  He denies abdominal pain, fevers, nausea, vomiting, diarrhea, constipation, or periodic rectal bleeding.  He is following up with Dr. Carolynne Edouard surgery tomorrow.  He had had a colonoscopy just prior to surgery, but is unsure if he had hemorrhoids.  He  follows with Dr. Dierdre Forth in rheumatology.  His prednisone has been tapered down to 7 mg, which will be his dose for now.  There has been mention of possibly starting Plaquenil in the future for his polymyalgia rheumatica.  OV 01/21/19: Mr. Kitchings is a 78 year old gentleman with a history of fibrosing NSIP diagnosed on open lung biopsy in 2019 who presents for routine follow-up and preoperative evaluation before colostomy reversal next week.  He has a history of polymyalgia rheumatica for which she has been on chronic steroids.  He is followed by Dr. Dierdre Forth from rheumatology.  Currently he is being titrated off his chronic prednisone, currently on 8 mg daily.  For his NSIP he is maintained on CellCept, nintedanib, and Bactrim 3 days a week.  He is doing well, better than last year from breathlessness standpoint.  He went hiking with his wife a few weeks ago, which he reports he would have been unable to do a year ago.  He has nausea and diarrhea associated with nintedanib, but he is able to tolerate these with the use of Zofran and Imodium.  No jaundice, itching, right upper quadrant pain.  In February 2020 he underwent emergency partial colectomy with diverting colostomy for perforated diverticulitis.  He is planning to undergo colostomy reversal next week.  In July he fell, causing an L1 compression fracture.  Dr. Dierdre Forth is planning to do a bone mineral density study, and currently he is not on calcium and vitamin D supplements.  In a few months he is planning on having a small outpatient back surgery to address a bone spur that is causing radicular pain in his left leg.  Today he wants to discuss plans for his pulmonary meds perioperatively.  He has previously had symptomatic renal insufficiency when he tried to come off prednisone on his own too quickly.  During his VATS and colectomy he received 10 mg of dexamethasone in the OR, and he received low-dose Solu-Medrol postoperatively from his  colectomy.     OV 07/21/19: Mr. Dinkins is a 78 year old gentleman with a history of ILD- UIP vs NSIP on chronic Ofev, mycophenolate who presents for follow-up.  He required disruption of these medications for several months in late 2020 his colostomy reversal.  When the meds were restarted postoperatively he had persistent bleeding, leading to prolonged time off medications and restarting them sequentially.  He feels that his breathing has gotten worse over time since then, but is unsure if that was all due to being off the meds or if it is continued to decline since then.  His breathing is usually worse during warmer weather.  He has noticed that his activities are more limited than they used to be.  He recently had an outpatient back surgery where he had to have bone fragments removed that were pinching and nerves.  He was developing left lower extremity weakness preoperatively.  Due to  ongoing nerve impingement that is presumed to be due to inflammation, he is currently on a prednisone taper.  Currently on 20 mg daily and will decrease to 15 mg daily tomorrow.  He is on chronic prednisone for polymyalgia rheumatica at 7 mg daily.  He has been on Ofev 100 mg twice daily.  He was initially started on 150 mg twice daily, but required decrease in his dose due to nausea, vomiting, diarrhea.  Over time the symptoms have improved.  He restarted his meds postoperatively in the fall 2020 he had symptoms short-term, but noticed improvement over time.  When he had GI symptoms at the higher dose, he had some improvement with Zofran and Phenergan.  He is currently not having blood in his stools or other issues related to surgery.    He and his wife are planning a cross-country trip with their RV to visit children and grandchildren later this spring where they will be gone for 2 months.  Reviewed rheumatology, Dr. Shawnee Knapp clinic note 06/15/2019.  Considering starting Plaquenil to reduce dose of prednisone.   OV  4?!5/21  Mr. Ahlquist is a 78 year old gentleman with a history of IPF who presents for follow-up.  He is accompanied by his wife today.  He recently had a severe shortness of breath episode that was precipitated by being around a significant amount of sawdust.  He did well with Stiolto, but developed thrush, which has been an issue for him with inhalers in the past.  He is improved with nystatin.  He stopped his Ofev due to severe intractable nausea and vomiting.  He now feels much better and is back to normal.  His shortness of breath is persistent-he has trouble walking around the house.  He has continued on his Bactrim and CellCept plus prednisone for polymyalgia rheumatica.  He wants to know if he can stop his Eliquis to be able to take an NSAID for help with his joint pain.  He has new leg edema bilaterally, but no edema elsewhere.  No chest pain.       OV 10/22/2019  Subjective:  Patient ID: Tiffany Kocher, male , DOB: 30-Mar-1946 , age 45 y.o. , MRN: [id number] , ADDRESS: [address] [place] [address]   PCP Benita Stabile, MD Rheum - Dr Keturah Shavers 0Dr McQuaid -> Dr Zoe Lan -> Dr Marchelle Gearing  10/22/2019 -   Chief Complaint  Patient presents with   Follow-up    Pt being referred to Dr. Marchelle Gearing by Dr. Chestine Spore for ILD.  Pt states he has been doing good since last visit and states that he feels SOB is stable. Denies any complaints of cough or chest discomfort.  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  - in 2019 : considered progressive pre-bx (on PFT/CT per notes)  - in 2019 - clinical CTD ruled out by Dr. Lyanne Co record review_  -High-resolution CT March 2021: Indeterminate for UIP without change since 2017  RX  -  Mid-endJan 2020 -s tarted cellcept/bactrim + ofev (course complicated early February 2020 by diverticulitis and intermittent side effects with stop start of the nintedanib)  Pathological tissue mild emphysema present on surgical lung biopsy October 2019 [not evident on pulmonary function testing]  - Alpha 1 - MM:  Oct 2019  -On inhaler therapy  -No obstruction seen on PFT or emphysema on CT   History of perforated sigmoid diverticulitis early February 2021 few weeks after starting nintedanib/CellCept  -Status post reversal of colostomy in September 2020   HPI Breton Berns Pittman 78 y.o. -presents to the ILD center with the above history.  Is a transfer of care to the ILD center.  Recently his case was discussed in June 2021 with interstitial lung disease multidisciplinary conference.  His pathology though showed fibroblastic foci did have then reformed branching fibrosis.  There was ossification.  There is no honeycombing.  There was heterogeneity and fibroblastic foci suggestive of UIP.  Overall it was felt that he had unclassifiable ILD.  The radiologist felt his CT scan had not progressed between 2018 and 2021.  His pulmonary function test were variable.  Therefore it was recommended that he be seen at the ILD center.  His ILD diagnosis as above.  He tells me though that when he started the nintedanib he started feeling better.  Although at the same time or before that he was also committed to inhaler therapy based on pathological emphysema on his lung biopsy from 2019.  This is also helped.  Therefore he is a concomitant diagnosis COPD/emphysema.  He is currently here to discuss his future care options.  He tells me  that he has continued CellCept and Bactrim for his ILD since early 2020 except for interruptions due to surgery.  He has been off nintedanib but recently has rechallenged himself at 150 mg once daily for the last 1 month and he is tolerating it fine.  His rheumatologist is working down on his prednisone to 5 mg/day [this is for polymyalgia rheumatica].  He tells me he has upcoming spinal surgery to the C-spine and later to the L-spine.  He wants clearance for this.  Noted he is on anticoagulation with Eliquis and has had bleeding episode.  [facility] [facility] Comprehensive ILD Questionnaire  Symptoms:    Overall he is reporting progressive dyspnea although the conference it was felt radiology did not change over time.  Is been going on insidious onset for the last 3 years.  Dyspnea present on exertion relieved by rest.     Past Medical History : As above   ROS: Positive for fatigue and diffuse arthralgia.  No dysphagia.  No dry eyes.  No Raynaud's no weight loss.  No vomiting.  No rash   FAMILY HISTORY of LUNG DISEASE:  -Father had asthma.  No COPD no sarcoid no cystic fibrosis no hypersensitive pneumonitis no autoimmune disease.  EXPOSURE HISTORY: Briefly smoked cigarettes for 3 years.  Otherwise never smoked  Cigarettes.  He did smoke marijuana very little in 1972 and never use cocaine.  No intravenous drug use     HOME and HOBBY DETAILS : [place].  Age of the home is 65 years.  He has lived in the home for 30 years.  No dampness no mildew no mold no humidifier use no CPAP use no nebulizer use.  No steam iron use.  No Jacuzzi use.  There is a misting Fountain in the house because his wife likes it.  But there is no mold or mildew.  No pet gerbils.  No feather pillows.  No mold in the [employer] duct.  No music habits no guarding habits.  No flood of water damage.  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  In the past he is done woodwork, Software engineer work, Event organiser.  He has been exposed to asbestos woodwork and furniture work.  During furniture work he got exposed to meth saline chloride.  He also is a Art gallery manager  PULMONARY TOXICITY HISTORY (27 items): CellCept, prednisone and nintedanib       ROS - per HPI  IMPRESSION: COMPARISON:  CT chest, 12/06/2017, 06/13/2016, 04/02/2016   FINDINGS: Cardiovascular: Aortic atherosclerosis. Normal heart size. No pericardial effusion.   Mediastinum/Nodes: No enlarged mediastinal, hilar, or axillary lymph nodes. Thyroid gland, trachea, and esophagus demonstrate no significant findings.   Lungs/Pleura: Wedge resections of the right lung. Redemonstrated mild pulmonary fibrosis with a slight apical to basal gradient featuring tubular bronchiectasis in the lower lobes, peripheral irregular interstitial pulmonary opacity, and no evidence of bronchiolectasis or honeycombing. Fibrotic findings are not significantly changed compared to prior examinations. No significant air trapping on expiratory phase examination. No pleural effusion or pneumothorax.  1. No significant change in mild pulmonary fibrosis with a slight apical to basal gradient featuring tubular bronchiectasis in the lower lobes, peripheral irregular interstitial pulmonary opacity, and no evidence of bronchiolectasis or honeycombing. No significant air trapping on expiratory phase examination. Findings remain consistent with "indeterminate for UIP" pattern fibrosis by ATS pulmonary fibrosis criteria. Findings are indeterminate for UIP per consensus guidelines: Diagnosis of Idiopathic Pulmonary Fibrosis: An Official ATS/ERS/JRS/ALAT Clinical Practice Guideline. Am Rosezetta Schlatter Crit Care Med Vol 198, Iss 5, [phone], Jan 12 2017.   2.  Interval wedge biopsies of the right lung.   3.   Aortic Atherosclerosis (ICD10-I70.0).     Electronically Signed   By: Lauralyn Primes M.D.   On: 07/30/2019 16:33     OV 12/30/2019  Subjective:  Patient ID: Tiffany Kocher, male , DOB: [birth year] , age 59 y.o. , MRN: [id number] , ADDRESS: [address] [place] [address]   12/30/2019 -   Chief Complaint  Patient presents with   Follow-up    reports feeling "about the same" since last visit. Last albuterol use was this past weekend   Follow-up unclassifiable interstitial lung disease with trace positive rheumatoid factor  HPI Vyom Brass Stai 78 y.o. -presents for follow-up. Since his last visit we recheck his autoimmune panel. His rheumatoid factor is only trace positive. He had pulmonary function test today and is documented below the stable. His spring 2021 CT chest is stable. Overall stability for few years. He again tells me that he is much better than he was 18 months ago in terms of shortness of breath. Symptom scores document that. At this point in time he is off CellCept. He thinks his improvement is related to starting nintedanib. His wife is here with him and I am meeting her for the first time. She did acknowledge that this potential that he is feeling better because back then when he had the PVCs of ILD diagnosed he was not in a good frame of mind. Because he was dealing with back and neck issues. This is because his pre and post  nintedanib PFT shows stability. Since his last visit he is off CellCept. He has had a C-spine surgery. His back surgery has been postponed. However he is dealing with significant diarrhea. The diarrhea is despite stopping CellCept. This is because of nintedanib. He currently has dropped his nintedanib to 100 mg once daily but despite that is still having diarrhea. He does take some coffee in the morning with sugar but despite that has diarrhea. He prefers that he stop the nintedanib but in the past he acknowledges that it helped  him.      OV 07/06/2020  Subjective:  Patient ID: Tiffany Kocher, male , DOB: 07-20-1945 , age 62 y.o. , MRN: [id number] , ADDRESS: [address] [place] [address] PCP Benita Stabile, MD Patient Care Team: Benita Stabile, MD as PCP - General (Internal Medicine) Lennette Bihari, MD as PCP - Cardiology (Cardiology)  This Provider for this visit: Treatment Team:  Attending Provider: Kalman Shan, MD    07/06/2020 -   Chief Complaint  Patient presents with   Follow-up    PFT performed today.  Pt states he feels like he might have declined some since last visit. Pt states his breathing is some worse. Denies any complaints of cough.      HPI Tuan Tippin Amini 78 y.o. -returns for follow-up for his ILD.  He presents with his wife.  He is on supportive care.  He quit taking his CellCept and nintedanib in the summer 2021 following diarrhea in the setting of GI surgery and colostomy reversal.  He does not want to take these drugs anymore.  However he tells me that he is feeling more short of breath in the last 4 months.  Present on exertion relieved by rest.  His symptom score reflect worsening shortness of breath.  He continues to have some amount of diarrhea because of reversal of his colostomy.  He had a high-resolution CT chest that shows stability in ILD per the radiologist since 2017.  His walking desaturation test is stable.  However his pulmonary function test shows confusing signal compared to 6 months or 1 year ago.  Is stable versus normal variation versus slight decline.  I personally visualized the images of the pulmonary function test and the grph with him.  He is open to getting his pulmonary pathology slides sent for third opinion at an outside institution.  We discussed antifibrotic's in case his fibrosis is getting worse.  He is definitely not interested in the other option of pirfenidone after hearing side effects of nausea, vomiting, weight loss, fatigue and  possible diarrhea.  He is interested in drugs that may not have GI side effects.  These are available in clinical trials.  For which she will need an IPF diagnosis.  Therefore is interested in getting another opinion on the slides to see if you would qualify for trials in the future.  His wife does indicate that he did have an episode of chest pain many went outdoors a few months ago and was climbing a hill.  In fact the chest pain was so bad it was central that he had to come back.  They have not reported this to the cardiologist.  He has cardiology follow-up coming up with Dr. Tresa Endo in May 2022.  He had normal stress test or low risk in 2017.  His echo was normal in May 2021.       IMPRESSION: HRCT  Feb 2022  COMPARISON:  07/30/2019,  04/02/2016   FINDINGS: Cardiovascular: Aortic atherosclerosis. Normal heart size. No pericardial effusion.   Mediastinum/Nodes: No enlarged mediastinal, hilar, or axillary lymph nodes. Thyroid gland, trachea, and esophagus demonstrate no significant findings.   Lungs/Pleura: Evidence of prior right lung wedge resections. No significant interval change in pattern of mild pulmonary fibrosis featuring irregular peripheral interstitial opacity and mild, tubular bronchiectasis at the lung basis without evidence of significant subpleural bronchiolectasis or honeycombing. No significant air trapping on expiratory phase imaging. No pleural effusion or pneumothorax.    1. No significant interval change in pattern of mild pulmonary fibrosis featuring irregular peripheral interstitial opacity and mild, tubular bronchiectasis at the lung basis without evidence of significant subpleural bronchiolectasis or honeycombing. Findings remain consistent with an "indeterminate for UIP" pattern of fibrosis by pulmonary fibrosis criteria. Findings are indeterminate for UIP per consensus guidelines: Diagnosis of Idiopathic Pulmonary Fibrosis: An Official ATS/ERS/JRS/ALAT  Clinical Practice Guideline. Am Rosezetta Schlatter Crit Care Med Vol 198, Iss 5, [phone], Jan 12 2017. 2. Evidence of prior right lung wedge resections.   Aortic Atherosclerosis (ICD10-I70.0).    According to the radiologist the pulmonary fibrosis is stable between 2017 through February 2022 on CT scan of the chest   Plan -We discussed about starting the other antifibrotic pirfenidone but given your history of GI side effects with nintedanib I respect your desire to refuse but continue with expectant follow-up  -We will send a message to Dr. Nicki Guadalajara to evaluate you for angina  -Once Dr. Tresa Endo clears you I think you should join pulmonary rehabilitation  -I have sent a note to our pathologist to get another opinion from another institution on your lung pathology to see if you have undifferentiated connective tissue disease or UIP/IPF.  If you have UIP/IPF you might qualify for clinical trials with drugs that do not have GI side effects  Follow-up -Do spirometry and DLCO in 3 months =     OV 02/09/2021  Subjective:  Patient ID: Tiffany Kocher, male , DOB: 1946-05-02 , age 78 y.o. , MRN: [id number] , ADDRESS: [address] [place] [address] PCP Benita Stabile, MD Patient Care Team: Benita Stabile, MD as PCP - General (Internal Medicine) Lennette Bihari, MD as PCP - Cardiology (Cardiology)  This Provider for this visit: Treatment Team:  Attending Provider: Kalman Shan, MD    02/09/2021 -   Chief Complaint  Patient presents with   Follow-up    PFT performed today.  Pt states he recently had back surgery 12/22/20. States his breathing has been about the same.   2  HPI Denario Bagot Carrol 78 y.o. -follow-up unclassifiable ILD.  Last seen first in February 2022.  After that in July 2022 he saw a nurse practitioner for a preop clearance.  Since then he has had back fusion surgery.  He is slowly recovering from that.  He is here with his wife Okey Regal.  Dyspnea score  is stable.  He is not interested in antifibrotic's given previous side effects.  Last visit in February 2022 I contacted pathology to get another opinion from the [facility] clinic.  This is because the 2019 pathology revealed grade at [facility] said unclassifiable ILD but also indicated that there might be UIP.  I was not sure.  Still waiting on that interpretation in clinic.  I followed up with pathology about it nevertheless he is doing stable.  He will have a high-dose flu shot today.  He has never had COVID and wanted  to know if he should get the COVID by Valent mRNA booster.  I replied in the affirmative.  Currently is dealing a lot with back issues and slowly getting more mobile.  He and his wife wanting to travel to [place] to visit family.  They asked about travel advice in the era of COVID.     OV 10/31/2021  Subjective:  Patient ID: Tiffany Kocher, male , DOB: 09/20/1945 , age 77 y.o. , MRN: [id number] , ADDRESS: [address] [place] [address] PCP Benita Stabile, MD Patient Care Team: Benita Stabile, MD as PCP - General (Internal Medicine) Lennette Bihari, MD as PCP - Cardiology (Cardiology)  This Provider for this visit: Treatment Team:  Attending Provider: Kalman Shan, MD     10/31/2021 -   Chief Complaint  Patient presents with   Follow-up    Pt states that his breathing has become worse since last visit. Denies any complaints of cough.     HPI Demarques Pilz Whitener 78 y.o. -returns for follow-up.  Last seen in September 2022.  He states our office never called back to make his 46-month follow-up.  He then had to call because he is now having insidious onset of worsening shortness of breath.  In December 2022 after his organ trip he picked up a respiratory viral infection with persistent cough.  Since then he has been more short of breath.  His symptom score is only slightly worse.  His walking desaturation test is stable but his pulmonary function test shows a  significant decline in FVC and DLCO.  Last CT scan of the chest was a year ago.  Last echo was a year ago.  He is worried about his decline.  In the past he has been intolerant to nintedanib we discussed the [facility] pathology results as unclassifiable ILD.  Explained to him IPF versus non-IPF.  Explained to him progressive phenotype and by unclassifiable ILD if it sent.   01/11/2022 Follow up : ILD  Patient returns 31-month follow-up.  Patient has underlying interstitial lung disease.  Last visit patient was started on Esbriet.  Patient says he tried 3 separate occasions to take Esbriet but was unable to tolerate due to severe GI symptoms and diarrhea.  He was set up for high-resolution CT chest that was done on January 09, 2022 that showed no significant interval change in his mild pulmonary fibrosis with a pattern apical to basilar gradient. 2D echo showed preserved EF, grade 1 diastolic function, right ventricular systolic function normal.  RV size is normal.  Labs were unrevealing.  BNP was normal. Patient says overall he is doing okay.  He tries to stay active with light chores around the house.  Patient denies any flare of cough or wheezing.  Has had no decrease in activity tolerance. Patient has PFTs planned for next month. Patient has tried Ofev in the past and was unable to tolerate due GI side effects. Patient says since last visit he is not worse or better he is about the same.  He gets winded with heavy activities.    OV 02/01/2022  Subjective:  Patient ID: Tiffany Kocher, male , DOB: April 23, 1946 , age 6 y.o. , MRN: [id number] , ADDRESS: [address]  [place] [address] PCP Benita Stabile, MD Patient Care Team: Benita Stabile, MD as PCP - General (Internal Medicine) Lennette Bihari, MD as PCP - Cardiology (Cardiology)  This Provider for this visit: Treatment Team:  Attending Provider: Kalman Shan, MD   L 02/01/2022 -   Chief Complaint  Patient presents with    Follow-up    PFT performed today. Pt states he is still having problems with SOB.     HPI Sahil Milner Manke 78 y.o. -returns for follow-up.  Has gained a few more pounds of weight.  He did try pirfenidone but this caused him nausea and diarrhea and then in August 2020 he had COVID he possibly pirfenidone.  Once he recovered from COVID he tried it again it made him sick again to his stomach.  Therefore he stopped it.  He has no interest in doing antifibrotic's again.  He had repeat pulmonary function test and this shows that the pulmonary function test is slightly better but overall 2023 the pulmonary function test is worse compared to 2022 and before.  Nevertheless symptom wise he is stable.  We did do a high-res CT and shows continued stability of the ILD for many years.  Therefore he might not have progressive phenotype.  Even if he does at this point in time he is not tolerating any of the approved antifibrotic's.  He is tolerating a weeklong trip to [place] to be with his daughter and son-in-law and grandchild and teach the grandchild fishing.  We discussed the ILD-Pro registry and if he qualifies he said he would be interested in it but his symptom scores remained stable and the CT scan has been stable.  Therefore he might not qualify.  He is willing to have the high-dose flu shot today.  He does not want have the COVID-vaccine because he believes he has natural immunity.  We did discuss RSV vaccine.        OV 06/05/2022  Subjective:  Patient ID: Tiffany Kocher, male , DOB: 1946-03-08 , age 64 y.o. , MRN: [id number] , ADDRESS: [address] [place] [address] PCP Benita Stabile, MD Patient Care Team: Benita Stabile, MD as PCP - General (Internal Medicine) Lennette Bihari, MD as PCP - Cardiology (Cardiology)  This Provider for this visit: Treatment Team:  Attending Provider: Kalman Shan, MD    06/05/2022 -   Chief Complaint  Patient presents with    Follow-up    Pt is here for ILD follow up and to get cleared for surgery. Surgery is pending. Right shoulder surgery. Pt did spiro/dlco today before visit. No pulmonary medications noted from patient      HPI Augustus Zurawski Vanaman 78 y.o. -presents for follow-up.  Presents with his wife.  They plan to go back to [place] again in August 2024.  Wife is giving independent history here.  They both confirm that he is stable.  He feels the same in terms of his shortness of breath.  He has non-- IPF phenotype and he is on supportive care after having failed both pirfenidone and nintedanib because of side effects.  Even though he feels stable at this time he gave a high at symptom score for shortness of breath than in the past.  He had pulmonary function test that is definitely stable today January 2024 compared to September 2023 in June 2023.  However I believe this is consistent and definite transition point somewhere between late 2022 in the summer 2023.  The numbers are definitely worse than late 2022 and also worse compared to 2021.  I did show these numbers to him.  Did indicate to him that while CT scan of the chest last summer  2023 is stable compared to 2022 and his symptoms are relatively stable things could be changing and he might be having progressive phenotype.  Did indicate to him currently antifibrotic's are not an option because of intolerance.  I believe he is also on CellCept in the past. Neck step is to consider clinical trials.  We discussed clinical trials and he is open to the idea of inhaled treprostinil inhaled pirfenidone assuming we will be able to offer this  Vaccination: He has not had his RSV vaccine.  I did indicate to him the need for this.  He had COVID in August 2023.  Indicated 6 months of natural immunity and to consider vaccine after February 2024  Preoperative respiratory exam: He is having right shoulder surgery and.  It is not scheduled it.  The doctors Dr. Ramond Marrow.  A  45-minute surgery possibly under general anesthesia.  Currently functional.  I think he is low-moderate risk.    OV 03/28/2023  Subjective:  Patient ID: Tiffany Kocher, male , DOB: 10-May-1946 , age 74 y.o. , MRN: [id number] , ADDRESS: [address] [place] [address] PCP Benita Stabile, MD Patient Care Team: Benita Stabile, MD as PCP - General (Internal Medicine) Lennette Bihari, MD as PCP - Cardiology (Cardiology)  This Provider for this visit: Treatment Team:  Attending Provider: Kalman Shan, MD    03/28/2023 -   Chief Complaint  Patient presents with   Follow-up    PFT's done today. Breathing has been progressively worse- gets winded walking fast or walking up an incline.   HPI Demetreus Lothamer Sandiford 78 y.o. -presents for routine follow-up.  Presents with his wife.  Last seen in January 2024.  He tells me that he is got worsening shortness of breath.  Is actually been going on for 2 years.  Symptom score indicates that since his last visit the shortness of breath is stable but over the last 2 years he is gotten worse.  His pulmonary function test shows decline at least 5 or 6% on the FVC and 11% on the DLCO compared to the previous test earlier in the year.  He has had previous intolerance to approved antifibrotic's and CellCept.  We discussed inhaled pirfenidone trial.  He has already read the consent given to him by the research team a week or 2 ago.  He understands is a one third chance of placebo in a year later this rollover but he says he is not interested because it does not fit in with his lifestyle.  He says he likes to have surgery and is finding the travel requirements to be a little burdensome.  Wife encouraged him to think about this.  He is agreed to revisit this with his wife at home but also get a CT scan 3 months [last 1 was in spring 2023] at the time of follow-up.   Otherwise no hospitalizations no ER visits no surgeries.  Only current issue is that  he is got some nausea and bloating which is a recurrence of a previous problem for the last 1 week and he is got an appointment with primary care physician.  Also in June 2024 he did have COVID-19 external.  External record review indicates that he did see primary care in October 2024 for microcytosis fatigue and September 2024 for paroxysmal A-fib.  He continues  He continues to be hobbled by his left knee and hernia.  He plans to have surgery at some point  in time in the future but not currently.        OV 07/02/2023  Subjective:  Patient ID: Tiffany Kocher, male , DOB: [birth year] , age 32 y.o. 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   - May clinic 3rd opinion 2022:  it is not IPF but they along with [facility] -place it as unclassfiable ILD.  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  - in 2019 : considered progressive pre-bx (on PFT/CT per notes)  - in 2019 - clinical CTD ruled out by Dr. Lyanne Co record review_  -High-resolution CT Augt 2023: Indeterminate for UIP without change since 2017    RX  - Mid-endJan 2020 -s tarted cellcept/bactrim + ofev (course  complicated early February 2020 by diverticulitis and intermittent side effects with stop start of the nintedanib)  - stoppe cellcept June 2021 due to diarrhea/immune suppressino  - off ofev aug 2021 due to severe diarrhea  - started pirfenidone July 2023 ->s topped Aug 2023   -He has declined to participate in clinical trial involving inhaled pirfenidone: 03/28/2023  Pathological tissue mild emphysema present on surgical lung biopsy October 2019 [not evident on pulmonary function testing]  - Alpha 1 - MM:  Oct 2019  -On inhaler therapy  -No obstruction seen on PFT or emphysema on CT   History of perforated sigmoid diverticulitis early February 2021 few weeks after starting nintedanib/CellCept  -Status post reversal of colostomy in September 2020  Bak fusio surgery summer 2022  Hx of Covid Aug 2023     07/02/2023 -   Chief Complaint  Patient presents with   Follow-up     HPI Markie Frith Nugent 78 y.o. -returns for routine follow-up.  At last visit I was concerned his ILD was declining.  But today he tells me that he is actually better.  In the interim main issue is that he is small bowel bacterial overgrowth for the last few months he has had increased bloating nausea.  And this has been diagnosed.  He is finished bunch of antibiotics and his symptoms are better.  He still has some residual GI symptoms [see below].  However in terms of his pulmonary function test although worse since 2022 he has been stable since summer 2023.  His pulmonary fibrosis on the CT scan is also stable since summer 2023.  He is very reassured by this.  Therefore we decided not recommend any clinical trial for progressive phenotype because he has been stable for the last year and a half.  His granddaughter from [place] visited him.  In the summer he plans to go to [place] in [place] but is daughter and son live respectively.  His wife  is here with him she is independent historian she attest that he is  stable.       SYMPTOM SCALE - ILD 10/22/2019  12/30/2019  07/06/2020  02/09/2021 Supporitive care. S/p back surgery. 183# 10/31/2021  -> start esbriet 02/01/2022 -> off esbriet due to side effect 06/05/2022 r 03/28/2023  07/02/2023   O2 use ra ra ra ra a ra ra ra ra  Shortness of Breath 0 -> 5 scale with 5 being worst (score 6 If unable to do)          At rest 1 1 0 1 1 1 1 1 1   Simple tasks - showers, clothes change, eating, shaving 3 1 2 1 1 1 2 2 2   Household (dishes, doing bed, laundry) 3 2 2 1 2 1 3  03 3  Shopping 3 1 2 1 2 2 3  03 3  Walking level at own pace 4 2 3 3 2 3 2 3 4   Walking up Stairs 5 4 5 5 5 5 5 17 5   Total (30-36) Dyspnea Score 19 11 13 12 14 13 17 20 18   How bad is your cough? 0 0 0 0 0 0 0  0  How bad is your fatigue yes 3 2 2 2 1 2  03 5  How bad is nausea yes 3 ofev 1 1 1 1 1  00 2  How bad is vomiting?  n 0 0 0 0 0 0 00 0  How bad is diarrhea? no 3 ofev 1 - baseline from GI surgery 0 00 2 3 0 4  How bad is anxiety? x 1 0 1 0 1 1 0 1  How bad is depression x 1 0 1 0 1 1 0 1     Simple office walk 185 feet x  3 laps goal with forehead probe 10/22/2019  07/06/2020  10/31/2021  02/01/2022  03/28/2023    O2 used ra ra ra ra ra   Number laps completed 3 3 3 3  Siut stand x 15   Comments about pace avg avg   Good pace   Resting Pulse Ox/HR 98% and 90/min 98% and HR 78 99% and HR 91 98% and HR 68 95% and HR 76   Final Pulse Ox/HR 96% and 107/min 97% and HR 95 96% and HR 108 97% and HR 84 93% and HR 97   Desaturated </= 88% no  nni     Desaturated <= 3% points no  no     Got Tachycardic >/= 90/min yes  Yes, 3 points No 1 point    Symptoms at end of test Mild dyspnea Mild dy spnea Mild dyspnea Mild duyspnea    Miscellaneous comments x         CT Chest data from date:   Narrative & Impression  CLINICAL DATA:  Interstitial lung disease, pulmonary fibrosis, history of hypertension and atrial fibrillation   EXAM: CT CHEST WITHOUT CONTRAST    TECHNIQUE: Multidetector CT imaging of the chest was performed following the standard protocol without intravenous contrast. High resolution imaging of the lungs, as well as inspiratory and expiratory imaging, was performed.   RADIATION DOSE REDUCTION: This exam was performed according to the departmental dose-optimization program which includes automated exposure control, adjustment of the mA and/or kV according to patient size and/or use of iterative reconstruction technique.   COMPARISON:  01/09/2022   FINDINGS: Cardiovascular: Aortic atherosclerosis. Normal heart size. No pericardial effusion.   Mediastinum/Nodes: No enlarged mediastinal,  hilar, or axillary lymph nodes. Thyroid gland, trachea, and esophagus demonstrate no significant findings.   Lungs/Pleura: Unchanged mild pulmonary fibrosis in a pattern with apical to basal gradient, featuring irregular peripheral interstitial opacity scattered ground-glass, and tubular bronchiectasis in the lung bases. No evidence of subpleural bronchiolectasis or honeycombing. Mild, lobular air trapping on expiratory phase imaging. Unchanged postoperative findings of right lung wedge biopsy. No pleural effusion or pneumothorax.   Upper Abdomen: No acute abnormality. Status post cholecystectomy. Hepatic steatosis.   Musculoskeletal: No chest wall abnormality. No acute osseous findings. Status post right shoulder reverse arthroplasty.   IMPRESSION: 1. Unchanged mild pulmonary fibrosis in a pattern with apical to basal gradient, featuring irregular peripheral interstitial opacity scattered ground-glass, and tubular bronchiectasis in the lung bases. No evidence of subpleural bronchiolectasis or honeycombing. Findings remain indeterminate for UIP per consensus guidelines: Diagnosis of Idiopathic Pulmonary Fibrosis: An Official ATS/ERS/JRS/ALAT Clinical Practice Guideline. Am Rosezetta Schlatter Crit Care Med Vol 198, Iss 5, [phone], Jan 12 2017. 2. Mild, lobular air trapping on expiratory phase imaging, suggesting small airways disease, or perhaps alternately a component of interstitial lung disease. 3. Hepatic steatosis.   Aortic Atherosclerosis (ICD10-I70.0).     Electronically Signed   By: Jearld Lesch M.D.   On: 07/02/2023 09:11      PFT     Latest Ref Rng & Units 07/02/2023    1:36 PM 03/28/2023    3:02 PM 06/05/2022   10:05 AM 02/01/2022    8:57 AM 10/20/2021    4:09 PM 02/09/2021    1:43 PM 07/06/2020    8:47 AM  PFT Results  FVC-Pre L 3.23  P 2.86  3.04  3.18  2.64  3.61  3.50   FVC-Predicted Pre % 82  P 73  76  80  66  90  86   FVC-Post L       3.46   FVC-Predicted Post %       85   Pre FEV1/FVC % % 86  P 86  85  82  88  83  81   Post FEV1/FCV % %       84   FEV1-Pre L 2.78  P 2.47  2.58  2.62  2.31  2.98  2.82   FEV1-Predicted Pre % 98  P 87  90  91  80  103  96   FEV1-Post L       2.89   DLCO uncorrected ml/min/mmHg 15.03  P 14.15  15.96  14.70  14.53  20.68  19.11   DLCO UNC% % 63  P 59  67  61  61  86  79   DLCO corrected ml/min/mmHg 15.03  P 14.15  15.96  14.70  14.53  22.11  19.11   DLCO COR %Predicted % 63  P 59  67  61  61  92  79   DLVA Predicted % 79  P 79  87  81  82  100  90   TLC L       5.29   TLC % Predicted %       79   RV % Predicted %       71     P Preliminary result       LAB RESULTS last 96 hours No results found.       has a past medical history of Allergy to alpha-gal, Arthritis, Chest pain, Complication of anesthesia, Difficult intubation (02/19/2018), Digestive problems, Dyspnea, Dysrhythmia,  GERD (gastroesophageal reflux disease), H/O viral myocarditis, Head injury, closed, with concussion, Hyperlipidemia, Hypertension, Inguinal hernia, Mild mitral valve prolapse, PAF (paroxysmal atrial fibrillation) (HCC), Polymyalgia rheumatica (HCC), PONV (postoperative nausea and vomiting), Pre-diabetes, Pulmonary fibrosis (HCC), and Sleep apnea.   reports that he has never  smoked. He has never used smokeless tobacco.  Past Surgical History:  Procedure Laterality Date   ANTERIOR CERVICAL DECOMP/DISCECTOMY FUSION N/A 11/10/2019   Procedure: Cervical Three-Four Anterior cervical decompression/discectomy/fusion;  Surgeon: Barnett Abu, MD;  Location: [facility] OR;  Service: Neurosurgery;  Laterality: N/A;  Cervical Three-Four Anterior cervical decompression/discectomy/fusion   apendectomy  1965   APPENDECTOMY     APPLICATION OF ROBOTIC ASSISTANCE FOR SPINAL PROCEDURE N/A 12/22/2020   Procedure: APPLICATION OF ROBOTIC ASSISTANCE FOR SPINAL PROCEDURE;  Surgeon: Barnett Abu, MD;  Location: [facility] OR;  Service: Neurosurgery;  Laterality: N/A;   BACK SURGERY     BIOPSY  04/18/2023   Procedure: BIOPSY;  Surgeon: Franky Macho, MD;  Location: [facility] SUITE;  Service: Endoscopy;;   bone spur Bilateral 1999   feet   CARDIAC CATHETERIZATION  2001   CHOLECYSTECTOMY  2004   COLON RESECTION N/A 06/15/2018   Procedure: SIGMOID COLON RESECTION;  Surgeon: Griselda Miner, MD;  Location: [facility] ORS;  Service: General;  Laterality: N/A;   COLONOSCOPY     COLOSTOMY N/A 06/15/2018   Procedure: COLOSTOMY;  Surgeon: Griselda Miner, MD;  Location: [facility] ORS;  Service: General;  Laterality: N/A;   COLOSTOMY TAKEDOWN N/A 01/26/2019   Procedure: LAPAROSCOPIC ASSISTED COLOSTOMY TAKEDOWN;  Surgeon: Griselda Miner, MD;  Location: [facility] OR;  Service: General;  Laterality: N/A;   DG GREAT TOE LEFT FOOT Bilateral    bone spurs   ESOPHAGOGASTRODUODENOSCOPY (EGD) WITH PROPOFOL N/A 04/18/2023   Procedure: ESOPHAGOGASTRODUODENOSCOPY (EGD) WITH PROPOFOL;  Surgeon: Franky Macho, MD;  Location: [facility];  Service: Endoscopy;  Laterality: N/A;  2:05PM;ASA 1-2   EYE SURGERY Bilateral    cataract removal   LAPAROSCOPIC LYSIS OF ADHESIONS N/A 01/26/2019   Procedure: LAPAROSCOPIC LYSIS OF ADHESIONS;  Surgeon: Griselda Miner, MD;  Location: [facility] OR;  Service: General;  Laterality: N/A;   LAPAROTOMY N/A  06/15/2018   Procedure: EXPLORATORY LAPAROTOMY;  Surgeon: Griselda Miner, MD;  Location: [facility] ORS;  Service: General;  Laterality: N/A;   LUMBAR LAMINECTOMY/ DECOMPRESSION WITH MET-RX Right 05/05/2013   Procedure: Right Lumbar three-four Extraforaminal Microdiskectomy with Metrex;  Surgeon: Barnett Abu, MD;  Location: [facility];  Service: Neurosurgery;  Laterality: Right;  Right Lumbar three-four Extraforaminal Microdiskectomy with Metrex   LUNG BIOPSY Right 02/19/2018   Procedure: LUNG BIOPSY;  Surgeon: Loreli Slot, MD;  Location: [facility] OR;  Service: Thoracic;  Laterality: Right;   POSTERIOR LUMBAR FUSION 4 LEVEL N/A 12/22/2020   Procedure: Lumbar one-two Posterior lumbar interbody fusion with stabilization from Thoracic ten to Lumbar one with robotic screw placement;  Surgeon: Barnett Abu, MD;  Location: [facility] OR;  Service: Neurosurgery;  Laterality: N/A;  RM 20   REVERSE SHOULDER ARTHROPLASTY Right 07/11/2022   Procedure: REVERSE SHOULDER ARTHROPLASTY;  Surgeon: Bjorn Pippin, MD;  Location: [facility] ORS;  Service: Orthopedics;  Laterality: Right;   SHOULDER OPEN ROTATOR CUFF REPAIR Bilateral 2001   TONSILLECTOMY     VIDEO ASSISTED THORACOSCOPY Right 02/19/2018   Procedure: VIDEO ASSISTED THORACOSCOPY;  Surgeon: Loreli Slot, MD;  Location: [facility] OR;  Service: Thoracic;  Laterality: Right;    Allergies  Allergen Reactions   Xylocaine [Lidocaine] Anaphylaxis  Injection form   Codeine Nausea And Vomiting   Statins Other (See Comments)    Muscle soreness and an aching feeling all over Myalgias   Aldactone [Spironolactone]     Sore nipples and chest   Alpha-Gal     2015; has been able to re-introduce red meat for the past 2 1/2 years without reaction (as of 01/21/19)   Pirfenidone Other (See Comments)    GI side effects   Percocet [Oxycodone-Acetaminophen] Itching    Immunization History  Administered Date(s) Administered   Fluad Quad(high Dose 65+) 02/09/2021, 02/01/2022    Influenza Split 03/20/2016   Influenza, High Dose Seasonal PF 01/29/2018, 02/25/2019, 03/20/2019, 12/13/2019, 01/15/2023   Influenza,inj,Quad PF,6+ Mos 03/12/2017   Moderna Sars-Covid-2 Vaccination 06/04/2019, 07/10/2019, 03/18/2020, 02/22/2023   Pneumococcal Conjugate-13 01/10/2018   Rsv, Bivalent, Protein Subunit Rsvpref,pf Verdis Frederickson) 06/11/2022    Family History  Problem Relation Age of Onset   Rheum arthritis Mother    Thyroid cancer Mother    Heart disease Father    Asthma Father    Thyroid cancer Sister    Melanoma Brother      Current Outpatient Medications:    acetaminophen (ACETAMINOPHEN 8 HOUR) 650 MG CR tablet, Take 650 mg by mouth every 8 (eight) hours as needed for pain., Disp: , Rfl:    cetirizine (ZYRTEC) 10 MG tablet, Take 10 mg by mouth daily as needed for allergies. , Disp: , Rfl:    Cholecalciferol (DIALYVITE VITAMIN D 5000) 125 MCG (5000 UT) capsule, Take 5,000 Units by mouth daily., Disp: , Rfl:    diltiazem (CARDIZEM CD) 120 MG 24 hr capsule, TAKE ONE CAPSULE BY MOUTH DAILY, Disp: 30 capsule, Rfl: 0   ELIQUIS 5 MG TABS tablet, TAKE ONE TABLET (5MG  TOTAL) BY MOUTH TWODAILY (Patient taking differently: Take 5 mg by mouth 2 (two) times daily.), Disp: 180 tablet, Rfl: 1   ezetimibe (ZETIA) 10 MG tablet, TAKE ONE (1) TABLET BY MOUTH EVERY DAY, Disp: 90 tablet, Rfl: 3   fluticasone (FLONASE) 50 MCG/ACT nasal spray, Place 2 sprays into both nostrils daily as needed for allergies or rhinitis., Disp: , Rfl:    hydroxychloroquine (PLAQUENIL) 200 MG tablet, Take 200 mg by mouth 2 (two) times daily., Disp: , Rfl:    metoprolol tartrate (LOPRESSOR) 25 MG tablet, TAKE ONE TABLET BY MOUTH TWICE A DAY, Disp: 180 tablet, Rfl: 3   omeprazole (PRILOSEC) 40 MG capsule, Take 1 capsule (40 mg total) by mouth daily., Disp: 60 capsule, Rfl: 1   predniSONE (DELTASONE) 5 MG tablet, Take 5 mg by mouth daily. , Disp: , Rfl:    rosuvastatin (CRESTOR) 10 MG tablet, Take 0.5 tablets (5 mg total)  by mouth once a week. Pt needs to make appt with provider for further refills - 1st attempt, Disp: 10 tablet, Rfl: 0   tamsulosin (FLOMAX) 0.4 MG CAPS capsule, Take 0.4 mg by mouth 2 (two) times daily. , Disp: , Rfl:       Objective:   Vitals:   07/02/23 1404  BP: 124/80  Pulse: 78  SpO2: 95%  Weight: 185 lb (83.9 kg)  Height: 5\' 8"  (1.727 m)    Estimated body mass index is 28.13 kg/m as calculated from the following:   Height as of this encounter: 5\' 8"  (1.727 m).   Weight as of this encounter: 185 lb (83.9 kg).  @WEIGHTCHANGE @  [employer]   07/02/23 1404  Weight: 185 lb (83.9 kg)     Physical Exam  General: No distress. Looks well O2 at rest: no Cane present: no Sitting in wheel chair: no Frail: no Obese: no Neuro: Alert and Oriented x 3. GCS 15. Speech normal Psych: Pleasant Resp:  Barrel Chest - no.  Wheeze - no, Crackles - no, No overt respiratory distress CVS: Normal heart sounds. Murmurs - no Ext: Stigmata of Connective Tissue Disease - no HEENT: Normal upper airway. PEERL +. No post nasal drip        Assessment:       ICD-10-CM   1. ILD (interstitial lung disease) (HCC)  J84.9 Pulmonary function test         Plan:     Patient Instructions  ILD (interstitial lung disease) (HCC)    -Clinically  you have NON-IPF variety of fibrosis -PFT , symbomt sand CT stable x 18 months (though worse since 2022) - You are currently on supportive care due to pirfenidone and nintedanib and CellCept intolerance   Plan - repeat PFT in 6 monts   Follow-up - -Do spirometry and DLCO in 6 months -Return to see Dr. Marchelle Gearing 6 months or sooner if needed - 15 min visit  - symtoms score and walk test at followup   FOLLOWUP Return in about 6 months (around 12/30/2023) for 15 min visit, after Cleda Daub and DLCO, with Dr Marchelle Gearing, Face to Face Visit.    SIGNATURE    Dr. Kalman Shan, M.D., F.C.C.P,  Pulmonary and Critical Care Medicine Staff  Physician, [facility] Center Director - Interstitial Lung Disease  Program  Pulmonary Fibrosis [facility] at [facility] [place], [place], [address]  Pager: [phone], If no answer or between  15:00h - 7:00h: call [phone] Telephone: [phone]  2:32 PM 07/02/2023

## 2023-07-02 NOTE — Progress Notes (Signed)
 pft

## 2023-07-04 DIAGNOSIS — Z7952 Long term (current) use of systemic steroids: Secondary | ICD-10-CM | POA: Diagnosis not present

## 2023-07-04 DIAGNOSIS — M353 Polymyalgia rheumatica: Secondary | ICD-10-CM | POA: Diagnosis not present

## 2023-07-04 DIAGNOSIS — M5136 Other intervertebral disc degeneration, lumbar region with discogenic back pain only: Secondary | ICD-10-CM | POA: Diagnosis not present

## 2023-07-04 DIAGNOSIS — S32010D Wedge compression fracture of first lumbar vertebra, subsequent encounter for fracture with routine healing: Secondary | ICD-10-CM | POA: Diagnosis not present

## 2023-07-04 DIAGNOSIS — R768 Other specified abnormal immunological findings in serum: Secondary | ICD-10-CM | POA: Diagnosis not present

## 2023-07-04 DIAGNOSIS — J84112 Idiopathic pulmonary fibrosis: Secondary | ICD-10-CM | POA: Diagnosis not present

## 2023-07-04 DIAGNOSIS — Z6827 Body mass index (BMI) 27.0-27.9, adult: Secondary | ICD-10-CM | POA: Diagnosis not present

## 2023-07-04 DIAGNOSIS — E663 Overweight: Secondary | ICD-10-CM | POA: Diagnosis not present

## 2023-07-04 DIAGNOSIS — M8080XD Other osteoporosis with current pathological fracture, unspecified site, subsequent encounter for fracture with routine healing: Secondary | ICD-10-CM | POA: Diagnosis not present

## 2023-07-05 ENCOUNTER — Ambulatory Visit: Payer: PPO | Attending: Cardiovascular Disease | Admitting: Cardiovascular Disease

## 2023-07-05 ENCOUNTER — Encounter: Payer: Self-pay | Admitting: Cardiovascular Disease

## 2023-07-05 VITALS — BP 105/63 | HR 80 | Ht 68.0 in | Wt 188.0 lb

## 2023-07-05 DIAGNOSIS — Z7901 Long term (current) use of anticoagulants: Secondary | ICD-10-CM | POA: Diagnosis not present

## 2023-07-05 DIAGNOSIS — Z8679 Personal history of other diseases of the circulatory system: Secondary | ICD-10-CM | POA: Diagnosis not present

## 2023-07-05 DIAGNOSIS — J849 Interstitial pulmonary disease, unspecified: Secondary | ICD-10-CM

## 2023-07-05 DIAGNOSIS — E785 Hyperlipidemia, unspecified: Secondary | ICD-10-CM | POA: Diagnosis not present

## 2023-07-05 DIAGNOSIS — M19011 Primary osteoarthritis, right shoulder: Secondary | ICD-10-CM | POA: Diagnosis not present

## 2023-07-05 DIAGNOSIS — I480 Paroxysmal atrial fibrillation: Secondary | ICD-10-CM | POA: Diagnosis not present

## 2023-07-05 DIAGNOSIS — I2510 Atherosclerotic heart disease of native coronary artery without angina pectoris: Secondary | ICD-10-CM

## 2023-07-05 NOTE — Progress Notes (Signed)
[name], male   DOB: [birth year], 78 y.o.   MRN: [id number]    Primary M.D.: Dr. Dwana Melena  HPI: [name] is a 78 y.o. male who presents for a 40 month cardiology follow-up evaluation.   [name] has remote history of viral myocarditis in 1989 at which time his ejection fraction was 25%. He subsequently normalized LV function. He has a history of mild mitral valve prolapse. Cardiac catheterization in 2001 showed mild mid systolic LAD bridging. He has been noted to have mild T-wave changes on his ECG. Additional problems include hypertension as well as hyperlipidemia. He was unable to tolerate Lipitor. He initially tolerated Crestor but then developed significant myalgias.  He has been able to tolerate Zetia 10 mg daily.   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  Apparently, this has stabilized and is tired.  Ears have almost normalized.  He has been starting to resume very small amounts of red meat.    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  He denies any episodes of chest pain, or change in exercise tolerance.  On 02/01/2015.  He underwent lumbar surgery by Dr. Barnett Abu involving L2-L3, L3-L4, and L4-L5.  He tolerated surgery without cardiovascular compromise.  When I  saw him in January 2017 he was on amlodipine 2.5 mg and atenolol 25 g twice a day, which has been helpful for his blood pressure, palpitations, as well as documented systolic LAD muscle bridging.    At a subsequent office visit in November 2017 he underwent an echo Doppler study which was done on 03/22/2016 and this showed normal systolic function with an EF of 60-65% with grade 1 diastolic dysfunction.  His aorta was upper normal to mildly dilated at 38 mm.  There was no significant valvular pathology.  There was no TR Doppler jets an estimated PA systolic pressure was not able to be obtained.  Due to his  shortness of breath.  He also underwent a CT of his chest.  He had dependent linear and platelike atelectasis in lower lungs bilaterally.  The patient has a remote exposure to asbestosis in his 54s, but his CT scan did not reveal any evidence for pleural based calcification.  There were diffuse atelectatic changes in the lungs bilaterally.  He also underwent a nuclear perfusion study which showed normal perfusion and function without scar or ischemia.  I had sent off laboratory was notable for a normal BMP 53.  Troponins were negative.  He had been prescribed levaquin by his primary physician as empiric therapy for a mild fever.  His fever resolved but he continues to experience shortness of breath.  His white blood count 13 days ago was 11.2.  He was macrocytic with an MCV of 102 with a normal hemoglobin at 16.2, hematocrit of 46.0.  B12 and folate levels were normal.  An erythrocyte sedimentation rate was increased at 61 and a high-sensitivity C-reactive protein was significantly increased at 139.    I referred him to Dr. Kathrin Penner for pulmonary evaluation for possible interstitial lung disease and also [name] for rheumatologic follow-up.  He was given increased steroids.  In November 2017.  A high resolution CT scan of the chest showed patchy groundglass bilaterally andpatchy lower lobe atelectasis.  A barium swallow did not show evidence for esophageal disorder.  Antineutrophil cytoplasmic antibody testing was negative.  A follow-up high-resolution CT  was done on June 12, 2016, which showed mild basilar predominant subpleural reticulation and groundglass suggesting nonspecific interstitial pneumonitis, however, will interstitial pneumonitis could not be excluded.  He was also noted to have aortic atherosclerosis and a punctate stone in his left kidney.  When seen in 2018 he felt improved.  Subsequent laboratory had shown a sedimentation rate which improved from 61 to  1 and CRP which had improved from  139 to 6.8.  His lipids were elevated with a total cholesterol 222, triglycerides 359, HDL 67, VLDL 72, and LDL 83.  He developed myalgias with Crestor and Lipitor.  He denies chest pain. He is concerned about the cost of soe ofhis medicatios, partiyby.  He continues to be on pradaxa for anticoagulation and long-acting Cardizem.  He is unaware of any recent arrhythmia.  He had follow-up blood work on 01/15/2017 by Dwana Melena in [place].  Hemoglobin 17.5, hematocrit 48.7.  Lipid studies were improved with a total cholesterol 220, triglycerides 175, HDL 72, LDL 113.  Hemoglobin A1c was 5.7.  He is on CPAP therapy.  He nasal pillows, small size, the D[employer] [facility].  He has undergone more extensive pulmonary evaluation for his diffuse parenchymal lung disease of undetermined cause.  The February 19, 2018 he underwent an open lung biopsy by Dr. Dorris Fetch and there was some suggestion of possible early fibrotic process suggestive of UIP.  Subsequently, his biopsy was evaluated at [facility] by Dr. Laverna Peace.  He was felt to have chronic fibrosing interstitial pneumonia with an unclassifiable pattern.  They did query the possibility of association with an underlying systemic inflammatory or connective tissue disorder.  They did not believe he had UIP. He has also undergone follow-up rheumatologic evaluation.  He was started on a trial of Ofev.  When I saw him in December 2019 he denied any chest pain or palpitations, PND orthopnea,  presyncope or syncope.  He was not short of breath at rest but does get short of breath with mild activity.  He was on chronic steroids and inhalers and was on Cellcept and Ofev, followed by Dr Kendrick Fries.   He was admitted to the hospital on 06/15/2018 with acute diverticulitis requiring exploratory laparotomy and colostomy. The pt was taken off Cellcept and Ofev on admission  Postoperatively his QTC was noted to be prolonged- his QTC on 06/24/2018 was 624. Antibiotics were  adjusted.  He saw Corine Shelter, [facility]  for further evaluation on 06/25/2018. and f/u EKG.  His QTC  in the office was 447. He is slowly improving.  No palpitations or tachycardia, no unusual dyspnea.  He has noted his B/P has been running low-90's systolic.   I evaluated  him in May 2020 with a telemedicine visit.  At that time, he told me that he will be in need for reversal of his colostomy with colostomy takedown and sometime this year will also require back surgery by Dr. Danielle Dess.  His lung disease has improved with reinstitution of CellCept.  He denied any chest pain PND orthopnea.  He denies any palpitations.   He was seen 1 month later for follow-up evaluation and his breathing was significantly improved.  He was able to walk for significant duration recently went and noticed significant improvement from previously.  He is scheduled to see his primary physician later this week and had laboratory drawn on Oct 09, 2018.  Hemoglobin 16.1 hematocrit 47.5.  MCV had improved to 102 from a high of 110.  Renal function  was stable with a creatinine of 0.83.  LFTs were normal.  Total cholesterol was increased to 260 but triglycerides were improved at 145, LDL was 115 HDL cholesterol was excellent at 116.  Hemoglobin A1c was 6.0.  He underwent successful reversal of his colostomy with Dr. Carolynne Edouard.  He was evaluated in December 2020 by Edd Fabian, NP after experiencing episodes of some sharp chest pain with bending over.  He was not felt to have ischemic chest pain.  His activity has been limited due to an L1 fracture and he has been wearing a lumbar brace.  He has been followed by Dr. Danielle Dess.   When I last saw him in January 2021 I provided him with samples of combination bempedoic acid and Zetia with nasal is at 180/10 mg. Apparently, once the samples ran out, his insurance company denied paying for these since he does not have previously diagnosed significant CAD.   He was seen by Dr. Karie Fetch of pulmonary in  April 2021.  An echo Doppler study on Sep 14, 2019 showed an EF of 60 to 65%.  There were no wall motion abnormalities.  Diastolic parameters were indeterminate.  He had been on Ofev for interstitial lung disease.  He now sees Dr. Marchelle Gearing.  He developed progressive symptoms from his cervical spondylosis with myelopathy at C3-C4 cervical radiculopathy and underwent anterior cervical decompression C3-C4 arthrodesis with structural allograft anterior plate fixation by Dr. Barnett Abu on November 11, 2019. He is now followed by Dr. Marchelle Gearing for his interstitial lung disease and has had subsequent pulmonary function tests. He is no longer on Ofev. He is unaware of any breakthrough atrial fibrillation. He continues to be on diltiazem 120 mg and metoprolol tartrate 25 mg twice a day for hypertension. He is on Eliquis 5 mg twice a day.  He was evaluated by Dr. Marchelle Gearing and was told that his PFTs were a little more abnormal.  He had stopped his Ofev due to severe intractable nausea and vomiting and was feeling improved however he still experienced shortness of breath.  A high-resolution chest CT showed no significant interval change in the pattern of mild pulmonary fibrosis and the findings were "indeterminate for UIP."  Aortic atherosclerosis was present.  I saw him on July 29, 2020 at which time he admitted to experiencing some shortness of breath with activity as well as leg swelling at the end of the day on a daily basis. He was on diltiazem 240 mg, metoprolol tartrate 25 mg twice a day.  He is on Zetia 10 mg for hyperlipidemia in addition to low-dose rosuvastatin at 5 mg once a week.  He continued to be on low-dose prednisone at 5 mg and  Eliquis anticoagulation.  During that evaluation I recommended laboratory be checked and I initiated low-dose spironolactone 12.5 mg daily which would be helpful both for leg edema as well as potential diastolic dysfunction contributing to his exertional dyspnea.  I also  recommended a follow-up echo Doppler evaluation.  I last saw him on Sep 28, 2020.  He underwent an echo Doppler study on Sep 13, 2020 which showed EF 55 to 60% with grade 1 diastolic dysfunction.  There was no evidence for pulmonic stenosis.  Pulmonary pressures were normal.  He subsequently underwent laboratory by Dr. Dierdre Forth who follows him for polymyalgia.  Erythrocyte sedimentation rate was 11.  C-reactive protein was 16.  He had normal renal function with a creatinine of 1.06 and potassium of 4.2.  Is only he  denies any chest pain.  He still experiences some shortness of breath with exertion.  He has issues with chronic back pain.  Leg swelling had improved.    Since I last saw him, he underwent a 2D echo Doppler study on January 08, 2022.  Has continued to be followed by Dr. Marchelle Gearing for his interstitial lung disease.  His echo Doppler study demonstrated preserved LV systolic function with EF 55 to 60% with grade 1 diastolic dysfunction.  RV systolic pressure was normal.  Valves were essentially normal.  He was seen by Bernadene Person, NP on May 28, 2022 and remained stable.  He underwent a telephonic preoperative cardiovascular evaluation with Randall Gulling, NP on April 16, 2023.  Presently, [name] continues to feel well.  He sees Dr. Dwana Melena in [place] for primary care.  Laboratory in August 2024 showed total cholesterol 205, HDL 98, LDL 87, and triglycerides 098.  Hemoglobin A1c was 6.4.  Renal function was stable.  Remotely, he had issues with frequent dosing of statins.  He is on Zetia 10 mg and only takes 5 mg of rosuvastatin 1 day a week.  He continues to be on diltiazem CD1 20 mg and metoprolol titrate 25 mg twice a day.  He takes prednisone 5 mg daily.  He takes omeprazole for GERD.  He continues to be on Plaquenil 20 mg twice a day.  He denies chest pain or significant change in his very mild shortness of breath with his interstitial lung disease.  He presents for  evaluation.  Past Medical History:  Diagnosis Date   Allergy to alpha-gal    2015; has been able to re-introduce red meat for the past 2 1/2 years without reaction (as of 01/21/19)   Arthritis    Chest pain    a. 03/2017: echo showing EF of 60-65%, no regional WMA or significant valve abnormalities. b. 03/2016: NST with no evidence of ischemia.    Complication of anesthesia    "anaphylactic" reaction to Xylocaine > 10 years ago; reported later recieved marcaine without issue   Difficult intubation 02/19/2018   No difficulty 01/26/19 with Mac and 3 or 11/10/19 with Glidescope   Digestive problems    on Prednisone prn for this issue- diarrhea   Dyspnea    pulmonary fibrosis   Dysrhythmia    irregular due to Myocarditis- takes Atenolol, Norvasc   GERD (gastroesophageal reflux disease)    H/O viral myocarditis    25 years ago   Head injury, closed, with concussion    Breif LOC   Hyperlipidemia    Hypertension    Inguinal hernia    Mild mitral valve prolapse    per Dr Landry Dyke notes   PAF (paroxysmal atrial fibrillation) (HCC)    a. initially occuring in 02/2016. b. recurrent in 07/2016. Placed on Eliquis   Polymyalgia rheumatica (HCC)    PONV (postoperative nausea and vomiting)    Pre-diabetes    elevated due to high dose of prednsione for back no longer elevated   Pulmonary fibrosis (HCC)    Sleep apnea    no cpap mild    Past Surgical History:  Procedure Laterality Date   ANTERIOR CERVICAL DECOMP/DISCECTOMY FUSION N/A 11/10/2019   Procedure: Cervical Three-Four Anterior cervical decompression/discectomy/fusion;  Surgeon: Barnett Abu, MD;  Location: [facility] OR;  Service: Neurosurgery;  Laterality: N/A;  Cervical Three-Four Anterior cervical decompression/discectomy/fusion   apendectomy  1965   APPENDECTOMY     APPLICATION OF ROBOTIC ASSISTANCE FOR SPINAL PROCEDURE N/A  12/22/2020   Procedure: APPLICATION OF ROBOTIC ASSISTANCE FOR SPINAL PROCEDURE;  Surgeon: Barnett Abu, MD;   Location: [facility] OR;  Service: Neurosurgery;  Laterality: N/A;   BACK SURGERY     BIOPSY  04/18/2023   Procedure: BIOPSY;  Surgeon: Franky Macho, MD;  Location: [facility] SUITE;  Service: Endoscopy;;   bone spur Bilateral 1999   feet   CARDIAC CATHETERIZATION  2001   CHOLECYSTECTOMY  2004   COLON RESECTION N/A 06/15/2018   Procedure: SIGMOID COLON RESECTION;  Surgeon: Griselda Miner, MD;  Location: [facility] ORS;  Service: General;  Laterality: N/A;   COLONOSCOPY     COLOSTOMY N/A 06/15/2018   Procedure: COLOSTOMY;  Surgeon: Griselda Miner, MD;  Location: [facility] ORS;  Service: General;  Laterality: N/A;   COLOSTOMY TAKEDOWN N/A 01/26/2019   Procedure: LAPAROSCOPIC ASSISTED COLOSTOMY TAKEDOWN;  Surgeon: Griselda Miner, MD;  Location: [facility] OR;  Service: General;  Laterality: N/A;   DG GREAT TOE LEFT FOOT Bilateral    bone spurs   ESOPHAGOGASTRODUODENOSCOPY (EGD) WITH PROPOFOL N/A 04/18/2023   Procedure: ESOPHAGOGASTRODUODENOSCOPY (EGD) WITH PROPOFOL;  Surgeon: Franky Macho, MD;  Location: [facility];  Service: Endoscopy;  Laterality: N/A;  2:05PM;ASA 1-2   EYE SURGERY Bilateral    cataract removal   LAPAROSCOPIC LYSIS OF ADHESIONS N/A 01/26/2019   Procedure: LAPAROSCOPIC LYSIS OF ADHESIONS;  Surgeon: Griselda Miner, MD;  Location: [facility] OR;  Service: General;  Laterality: N/A;   LAPAROTOMY N/A 06/15/2018   Procedure: EXPLORATORY LAPAROTOMY;  Surgeon: Griselda Miner, MD;  Location: [facility] ORS;  Service: General;  Laterality: N/A;   LUMBAR LAMINECTOMY/ DECOMPRESSION WITH MET-RX Right 05/05/2013   Procedure: Right Lumbar three-four Extraforaminal Microdiskectomy with Metrex;  Surgeon: Barnett Abu, MD;  Location: [facility]UR[facility] OR[facility];  Service: Neurosurgery;  Laterality: Right;  Right Lumbar three-four Extraforaminal Microdiskectomy with Metrex   LUNG BIOPSY Right 02/19/2018   Procedure: LUNG BIOPSY;  Surgeon: Loreli Slot, MD;  Location: [facility] OR;  Service: Thoracic;  Laterality: Right;   POSTERIOR LUMBAR  FUSION 4 LEVEL N/A 12/22/2020   Procedure: Lumbar one-two Posterior lumbar interbody fusion with stabilization from Thoracic ten to Lumbar one with robotic screw placement;  Surgeon: Barnett Abu, MD;  Location[facility] OR;  Service: Neurosurgery;  Laterality: N/A;  RM 20   REVERSE SHOULDER ARTHROPLASTY Right 07/11/2022   Procedure: REVERSE SHOULDER ARTHROPLASTY;  Surgeon: Bjorn Pippin, MD;  Location: [facility] ORS;  Service: Orthopedics;  Laterality: Right;   SHOULDER OPEN ROTATOR CUFF REPAIR Bilateral 2001   TONSILLECTOMY     VIDEO ASSISTED THORACOSCOPY Right 02/19/2018   Procedure: VIDEO ASSISTED THORACOSCOPY;  Surgeon: Loreli Slot, MD;  Location[facility] OR;  Service: Thoracic;  Laterality: Right;    Allergies  Allergen Reactions   Xylocaine [Lidocaine] Anaphylaxis    Injection form   Codeine Nausea And Vomiting   Statins Other (See Comments)    Muscle soreness and an aching feeling all over Myalgias   Aldactone [Spironolactone]     Sore nipples and chest   Alpha-Gal     2015; has been able to re-introduce red meat for the past 2 1/2 years without reaction (as of 01/21/19)   Pirfenidone Other (See Comments)    GI side effects   Percocet [Oxycodone-Acetaminophen] Itching    Current Outpatient Medications  Medication Sig Dispense Refill   acetaminophen (ACETAMINOPHEN 8 HOUR) 650 MG CR tablet Take 650 mg by mouth every 8 (eight) hours as needed for pain.  cetirizine (ZYRTEC) 10 MG tablet Take 10 mg by mouth daily as needed for allergies.      Cholecalciferol (DIALYVITE VITAMIN D 5000) 125 MCG (5000 UT) capsule Take 5,000 Units by mouth daily.     diltiazem (CARDIZEM CD) 120 MG 24 hr capsule TAKE ONE CAPSULE BY MOUTH DAILY 30 capsule 0   diphenoxylate-atropine (LOMOTIL) 2.5-0.025 MG tablet Take 1 tablet by mouth once as needed for diarrhea or loose stools.     ELIQUIS 5 MG TABS tablet TAKE ONE TABLET (5MG  TOTAL) BY MOUTH TWODAILY (Patient taking differently: Take 5 mg by mouth 2 (two)  times daily.) 180 tablet 1   ezetimibe (ZETIA) 10 MG tablet TAKE ONE (1) TABLET BY MOUTH EVERY DAY 90 tablet 3   fluticasone (FLONASE) 50 MCG/ACT nasal spray Place 2 sprays into both nostrils daily as needed for allergies or rhinitis.     hydroxychloroquine (PLAQUENIL) 200 MG tablet Take 200 mg by mouth 2 (two) times daily.     metoprolol tartrate (LOPRESSOR) 25 MG tablet TAKE ONE TABLET BY MOUTH TWICE A DAY 180 tablet 3   omeprazole (PRILOSEC) 40 MG capsule Take 1 capsule (40 mg total) by mouth daily. 60 capsule 1   ondansetron (ZOFRAN) 8 MG tablet 8 mg every 8 (eight) hours as needed.     predniSONE (DELTASONE) 5 MG tablet Take 5 mg by mouth daily.      rosuvastatin (CRESTOR) 10 MG tablet Take 0.5 tablets (5 mg total) by mouth once a week. Pt needs to make appt with provider for further refills - 1st attempt 10 tablet 0   tamsulosin (FLOMAX) 0.4 MG CAPS capsule Take 0.4 mg by mouth 2 (two) times daily.      No current facility-administered medications for this visit.    Social History   Socioeconomic History   Marital status: Married    Spouse name: Not on file   Number of children: Not on file   Years of education: Not on file   Highest education level: Not on file  Occupational History   Not on file  Tobacco Use   Smoking status: Never   Smokeless tobacco: Never  Vaping Use   Vaping status: Never Used  Substance and Sexual Activity   Alcohol use: Yes    Alcohol/week: 21.0 standard drinks of alcohol    Types: 21 Glasses of wine per week    Comment: 2/3 glasses wine in evening   Drug use: No   Sexual activity: Yes    Partners: Female    Birth control/protection: Other-see comments    Comment: married  Other Topics Concern   Not on file  Social History Narrative   Not on file   Social Drivers of Health   Financial Resource Strain: Not on file  Food Insecurity: Not on file  Transportation Needs: Not on file  Physical Activity: Not on file  Stress: Not on file   Social Connections: Not on file  Intimate Partner Violence: Not on file   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 He is married has 2 children. There is no tobacco use. He stays active. There is no alcohol use.  He has 3 grandchildren.  He is now retired.  Family history is notable that both parents are deceased.  Mother died of old age.  Father died but had a history of mitral valve prolapse.  He has 2 brothers and one sister who are  alive and well.  ROS General: Negative; No fevers, chills, or night sweats;  HEENT: Negative; No changes in vision or hearing, sinus congestion, difficulty swallowing Pulmonary:  shortness of breath with activity; interstitial lung disease Cardiovascular: See history of present illness GI: Status post recent sigmoid colon perforation requiring emergent surgery with subsequent peritonitis and colostomy insertion. GU: Negative; No dysuria, hematuria, or difficulty voiding Musculoskeletal: Negative; no myalgias, joint pain, or weakness Hematologic/Oncology: Negative; no easy bruising, bleeding Endocrine: Negative; no heat/cold intolerance; no diabetes Neuro: Negative; no changes in balance, headaches Skin: Negative; No rashes or skin lesions Psychiatric: Negative; No behavioral problems, depression Sleep: Positive for OSA O on a home sleep study at [facility] pulmonary negative;  Other comprehensive 14 point system review is negative.  PE BP 105/63   Pulse 80   Ht 5\' 8"  (1.727 m)   Wt 188 lb (85.3 kg)   SpO2 (!) 80%   BMI 28.59 kg/m   Repeat blood pressure by me was 118/64  Wt Readings from Last 3 Encounters:  07/05/23 188 lb (85.3 kg)  07/02/23 185 lb (83.9 kg)  06/19/23 185 lb 1.6 oz (84 kg)   General: Alert, oriented, no distress.  Skin: normal turgor, no rashes, warm and dry HEENT: Normocephalic, atraumatic. Pupils equal round and reactive to light; sclera anicteric; extraocular muscles intact;  Nose without nasal septal hypertrophy Mouth/Parynx  benign; Mallinpatti scale 3 Neck: No JVD, no carotid bruits; normal carotid upstroke Lungs: clear to ausculatation and percussion; no wheezing or rales Chest wall: without tenderness to palpitation Heart: PMI not displaced, RRR, s1 s2 normal, 1/6 systolic murmur, no diastolic murmur, no rubs, gallops, thrills, or heaves Abdomen: soft, nontender; no hepatosplenomehaly, BS+; abdominal aorta nontender and not dilated by palpation. Back: no CVA tenderness Pulses 2+ Musculoskeletal: full range of motion, normal strength, no joint deformities Extremities: no clubbing cyanosis or edema, Homan's sign negative  Neurologic: grossly nonfocal; Cranial nerves grossly wnl Psychologic: Normal mood and affect   EKG Interpretation Date/Time:  Friday July 05 2023 13:55:13 EST Ventricular Rate:  80 PR Interval:  174 QRS Duration:  88 QT Interval:  390 QTC Calculation: 449 R Axis:   -14  Text Interpretation: Normal sinus rhythm Low voltage QRS Nonspecific T wave abnormality When compared with ECG of 24-Jun-2018 09:57, Vent. rate has decreased BY  43 BPM ST no longer depressed in Anterolateral leads Confirmed by Nicki Guadalajara ([id number]) on 07/05/2023 2:35:10 PM    Sep 28, 2020 ECG (independently read by me): NSR at 70 ; no ectopy, normal intervals  March 2022 ECG (independently read by me): NSR at 72; no ectopy    January 2022  ECG (independently read by me): Normal sinus rhythm at 87 bpm.  Mild RV conduction delay.  No ectopy.  Low voltage.  Normal intervals  June 2020 ECG (independently read by me): Normal sinus rhythm at 70 bpm.  Incomplete right bundle branch block.  QTc interval normal at 432 ms  06/25/2018 ECG (independently read by me): Normal sinus rhythm at 79 bpm.  QTc interval 4 4 7  ms.  05/12/2019 ECG (independently read by me): Normal sinus rhythm at 80 bpm.  No ectopy.  Normal intervals.  September 2018 ECG (independently read by me): Normal sinus rhythm at 65 bpm.  Isolated PAC.   Incomplete right bundle branch block.  Normal intervals.  March 2018 ECG (independently read by me): Normal sinus rhythm at 79 bpm.  QTc interval 460 ms.  PR interval normal at 158 ms.  November 2017 ECG (independently read by me): Normal sinus rhythm at 99  bpm, incomplete right bundle branch block.  Nondiagnostic ST-T changes.  January 2017 ECG (independently read by me): Normal sinus rhythm at 82 bpm.  Mild RV conduction delay.  Normal intervals.  No significant ST-T changes.  December 2015 ECG (independently read by me): Normal sinus rhythm at 74 bpm.  Mild RV conduction delay.  QTc interval 461 ms.  December 2014 ECG: Sinus rhythm at 57 beats per minute. No ectopy. Normal intervals.  LABS:     Latest Ref Rng & Units 06/28/2022   11:58 AM 10/31/2021   10:04 AM 12/23/2020    4:29 AM  BMP  Glucose 70 - 99 mg/dL 811  99  914   BUN 8 - 23 mg/dL 9  10  5    Creatinine 0.61 - 1.24 mg/dL 7.82  9.56  2.13   Sodium 135 - 145 mmol/L 140  139  138   Potassium 3.5 - 5.1 mmol/L 4.0  4.2  3.8   Chloride 98 - 111 mmol/L 103  104  102   CO2 22 - 32 mmol/L 26  27  27    Calcium 8.9 - 10.3 mg/dL 8.7  8.8  8.4       Latest Ref Rng & Units 10/31/2021   10:04 AM 10/22/2019    5:09 PM 07/21/2019    3:42 PM  Hepatic Function  Total Protein 6.0 - 8.3 g/dL 6.5  6.3  6.1   Albumin 3.5 - 5.2 g/dL 3.9  4.2  3.8   AST 0 - 37 U/L 16  19  15    ALT 0 - 53 U/L 17  12  17    Alk Phosphatase 39 - 117 U/L 66  56  52   Total Bilirubin 0.2 - 1.2 mg/dL 1.4  1.2  1.0   Bilirubin, Direct 0.0 - 0.3 mg/dL 0.2  0.2        Latest Ref Rng & Units 06/28/2022   11:58 AM 10/31/2021   10:04 AM 12/23/2020    4:29 AM  CBC  WBC 4.0 - 10.5 K/uL 10.3  8.4  8.6   Hemoglobin 13.0 - 17.0 g/dL 08.6  57.8  46.9   Hematocrit 39.0 - 52.0 % 46.7  48.0  35.9   Platelets 150 - 400 K/uL 219  200.0  150    Lab Results  Component Value Date   MCV 106.9 (H) 06/28/2022   MCV 103.8 (H) 10/31/2021   MCV 104.1 (H) 12/23/2020   Erythrocyte  Sedimentation Rate     Component Value Date/Time   ESRSEDRATE 2 10/22/2019 1709   CRP November 2017: 139  Repeat CRP 07/16/16:  6  Lab Results  Component Value Date   TSH 3.440 07/29/2020     Lab Results  Component Value Date   HGBA1C 5.2 06/28/2022     Lipid Panel     Component Value Date/Time   CHOL 222 (H) 07/16/2016 0902   TRIG 359 (H) 07/16/2016 0902   HDL 67 07/16/2016 0902   CHOLHDL 3.3 07/16/2016 0902   VLDL 72 (H) 07/16/2016 0902   LDLCALC 83 07/16/2016 0902   ------------------------------------------------------------------------------------------------------------- Oxygen Saturation: Rest: 92% with pulse of 93  Following walking in the office: 93% with a pulse of 106  03/21/2016 Nuclear Study Highlights   The left ventricular ejection fraction is normal (55-65%). Nuclear stress EF: 61%. The study is normal. This is a low risk study. There was no ST segment deviation noted during stress.     ------------------------------------------------------------------- 03/22/2016 ECHO Study Conclusions   -  Left ventricle: The cavity size was normal. Wall thickness was   normal. Systolic function was normal. The estimated ejection   fraction was in the range of 60% to 65%. Wall motion was normal;   there were no regional wall motion abnormalities. Doppler   parameters are consistent with abnormal left ventricular   relaxation (grade 1 diastolic dysfunction). - Aortic valve: There was no stenosis. - Aorta: Mildly dilated aortic root. Aortic root dimension: 38 mm   (ED). - Mitral valve: There was no significant regurgitation. - Right ventricle: The cavity size was normal. Systolic function   was normal. - Pulmonary arteries: No complete TR doppler jet so unable to   estimate PA systolic pressure. - Inferior vena cava: The vessel was normal in size. The   respirophasic diameter changes were in the normal range (>= 50%),   consistent with normal central venous  pressure.   Impressions:   - Normal LV size with EF 60-65%. Normal RV size and systolic   function. No significant valvular abnormalities.  --------------------------------------------------------------------------------------------  04/02/2016 EXAM: CT CHEST WITHOUT CONTRAST   TECHNIQUE: Multidetector CT imaging of the chest was performed following the standard protocol without IV contrast.   COMPARISON:  None.   FINDINGS: Cardiovascular: The heart size is normal. No pericardial effusion. Atherosclerotic calcification is noted in the wall of the thoracic aorta.   Mediastinum/Nodes: No mediastinal lymphadenopathy. No evidence for gross hilar lymphadenopathy although assessment is limited by the lack of intravenous contrast on today's study. There is no axillary lymphadenopathy.   Lungs/Pleura: Fine detail obscured by breathing motion. Dependent linear and platelike atelectasis noted in the lower lungs bilaterally. Components of associated parenchymal scarring not excluded. No evidence for pulmonary edema. No dense focal airspace consolidation. No evidence for pleural effusion.   Upper Abdomen: The liver shows diffusely decreased attenuation suggesting steatosis. Gallbladder surgically absent.   Musculoskeletal: Degenerative changes noted in the shoulders bilaterally.   IMPRESSION: Relatively diffuse atelectatic changes in the lungs bilaterally. Study degraded by patient breathing motion.   No specific findings to explain the patient's history of shortness of breath.   06/13/2016 CT Chest High Resolution  FINDINGS: Cardiovascular: Atherosclerotic calcification of the arterial vasculature. Heart size normal. No pericardial effusion.   Mediastinum/Nodes: No pathologically enlarged mediastinal or axillary lymph nodes. Hilar regions are difficult to evaluate without IV contrast. Esophagus is grossly unremarkable.   Lungs/Pleura: Mild basilar predominant subpleural  reticulation and ground-glass with minimal architectural distortion. No definite traction bronchiectasis/bronchiolectasis or honeycombing. Findings are likely unchanged from 04/02/2016 but there was a great deal of respiratory motion on that exam. There may be minimal air trapping. No pleural fluid. Airway is unremarkable.   Upper Abdomen: Visualized portions of the liver, adrenal glands and right kidney are grossly unremarkable. Punctate stone in the left kidney. Visualized portions of the spleen, pancreas, stomach and bowel are grossly unremarkable. Cholecystectomy. No upper abdominal adenopathy.   Musculoskeletal: No worrisome lytic or sclerotic lesions. Degenerative changes are seen in the spine.   IMPRESSION: 1. Mild basilar predominant subpleural reticulation and ground-glass, suggesting nonspecific interstitial pneumonitis. Usual interstitial pneumonitis cannot be excluded on this baseline examination. 2.  Aortic atherosclerosis (ICD10-170.0). 3. Punctate stone left kidney.   ECHO: 01/08/2022  1. Left ventricular ejection fraction, by estimation, is 55 to 60%. The  left ventricle has normal function. The left ventricle has no regional  wall motion abnormalities. Left ventricular diastolic parameters are  consistent with Grade I diastolic  dysfunction (impaired relaxation).   2. Right  ventricular systolic function is normal. The right ventricular  size is normal.   3. The mitral valve is normal in structure. No evidence of mitral valve  regurgitation. No evidence of mitral stenosis.   4. The aortic valve is tricuspid. Aortic valve regurgitation is not  visualized. No aortic stenosis is present.   5. The inferior vena cava is normal in size with greater than 50%  respiratory variability, suggesting right atrial pressure of 3 mmHg.   Comparison(s): No significant change from prior study. Prior images  reviewed side by side.   Conclusion(s)/Recommendation(s): Normal  biventricular function without  evidence of hemodynamically significant valvular heart disease.  IMPRESSION:  1. Coronary artery disease involving native coronary artery of native heart without angina pectoris   2. Hyperlipidemia LDL goal <70   3. ILD (interstitial lung disease) (HCC)   4. History of viral myocarditis   5. PAF (paroxysmal atrial fibrillation) (HCC)   6. Anticoagulated      ASSESSMENT AND PLAN: 1.  CAD: Mild mid systolic LAD bridging noted at catheterization in 2001.  He remains asymptomatic without chest pain  2 PAF:   Continues to maintain sinus rhythm on low-dose Cardizem CD 120 mg and metoprolol tartrate 25 mg twice a day.  He continues to be on Eliquis 5 mg twice a day for anticoagulation.    3. Eliquis for anticoagulation.  He is tolerating this well without bleeding.   4.  Exertional dyspnea and leg swelling: Blood pressure today is stable.  Remotely, low-dose spironolactone was added to his regimen with resolution of leg edema.  Since I last saw him in 2022, he is no longer on the lactone.  His most recent echo Doppler study continues to show EF excellent at 85 to 60% with mild grade 1 diastolic dysfunction.  5.  Fibrosing nonspecific interstitial pneumonitis: He was initially followed by Dr. Kendrick Fries and now sees Dr. Marchelle Gearing.    He did not tolerate Ofev due to severe nausea and vomiting.  Recent high-resolution chest CT on June 21, 2023 showed no change in mild pulmonary fibrosis in a pattern with apical to basal gradient, irregular peripheral interstitial opacities scattered groundglass and tubular bronchiectasis in the lung bases  6.  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  Recent echo Doppler from January 08, 2022 showed EF 55 to 60% with grade 1 diastolic dysfunction.  7.  Mixed hyperlipidemia: Currently, he is on Zetia 10 mg, rosuvastatin 5 mg once a week and had been on omega-3 fatty acids.   Dr. Catalina Pizza has been following laboratory.  Gwyndolyn Kaufman in August 2024 showed total cholesterol 205, HDL 98 LDL 87 and triglycerides 117.  He will be having repeat lab work next week.  I have suggested that in LP(a) be obtained at that time.  I have recommended rosuvastatin dose be at minimum increase to 10 mg once a week rather than the 5 mg which he has been on for several years target LDL less than 70.  If LP(a) is increased, target LDL should be more aggressive at less than 50%  8.  History of alpha gal deficiency: Stabilized  9.  OSA: remotely on CPAP.  Sleep followed by [facility].  Sleeping well.   Gust with him my plans for retirement this year.  I will transition him to the care of Dr. Denzil Magnuson to be seen in October/November 2025.   Tranae Laramie A.  Tresa Endo, MD, [facility]  07/05/2023 3:50 PM

## 2023-07-05 NOTE — Patient Instructions (Addendum)
Medication Instructions:  At minimum, take Rosuvastatin 10mg  once a week.  *If you need a refill on your cardiac medications before your next appointment, please call your pharmacy*   Lab Work: No labs were ordered during today's visit. Ask your primary care provider for the [facility] lab to be done.  If you have labs (blood work) drawn today and your tests are completely normal, you will receive your results only by: MyChart Message (if you have MyChart) OR A paper copy in the mail If you have any lab test that is abnormal or we need to change your treatment, we will call you to review the results.   Testing/Procedures: No procedures ordered today.    Follow-Up: At [facility], you and your health needs are our priority.  As part of our continuing mission to provide you with exceptional heart care, we have created designated Provider Care Teams.  These Care Teams include your primary Cardiologist (physician) and Advanced Practice Providers (APPs -  Physician Assistants and Nurse Practitioners) who all work together to provide you with the care you need, when you need it.  We recommend signing up for the patient portal called "MyChart".  Sign up information is provided on this After Visit Summary.  MyChart is used to connect with patients for Virtual Visits (Telemedicine).  Patients are able to view lab/test results, encounter notes, upcoming appointments, etc.  Non-urgent messages can be sent to your provider as well.   To learn more about what you can do with MyChart, go to [email or web address].    Your next appointment:   8 month(s)  Provider:   Dr. Epifanio Lesches     Other Instructions        1st Floor: - Lobby - Registration  - Pharmacy  - Lab - Cafe   2nd Floor: - PV Lab - Diagnostic Testing (echo, CT, nuclear med)   3rd Floor: - Vacant   4th Floor: - TCTS (cardiothoracic surgery) - AFib Clinic - Structural Heart Clinic - Vascular Surgery  -  Vascular Ultrasound   5th Floor: - [facility] (general and EP) - Clinical Pharmacy for coumadin, hypertension, lipid, weight-loss medications, and med management appointments      Valet parking services will be available as well.

## 2023-07-08 ENCOUNTER — Other Ambulatory Visit: Payer: Self-pay | Admitting: Cardiovascular Disease

## 2023-07-08 DIAGNOSIS — L570 Actinic keratosis: Secondary | ICD-10-CM | POA: Diagnosis not present

## 2023-07-08 DIAGNOSIS — X32XXXD Exposure to sunlight, subsequent encounter: Secondary | ICD-10-CM | POA: Diagnosis not present

## 2023-07-08 DIAGNOSIS — D225 Melanocytic nevi of trunk: Secondary | ICD-10-CM | POA: Diagnosis not present

## 2023-07-18 DIAGNOSIS — E1169 Type 2 diabetes mellitus with other specified complication: Secondary | ICD-10-CM | POA: Diagnosis not present

## 2023-07-18 DIAGNOSIS — D6949 Other primary thrombocytopenia: Secondary | ICD-10-CM | POA: Diagnosis not present

## 2023-07-18 DIAGNOSIS — E782 Mixed hyperlipidemia: Secondary | ICD-10-CM | POA: Diagnosis not present

## 2023-07-19 LAB — LAB REPORT - SCANNED
A1c: 5.4
Albumin, Urine POC: 8.6
Creatinine, POC: 160.3 mg/dL
EGFR: 86
Microalb Creat Ratio: 5

## 2023-07-24 ENCOUNTER — Encounter: Payer: Self-pay | Admitting: Internal Medicine

## 2023-07-24 DIAGNOSIS — I480 Paroxysmal atrial fibrillation: Secondary | ICD-10-CM | POA: Diagnosis not present

## 2023-07-24 DIAGNOSIS — M19019 Primary osteoarthritis, unspecified shoulder: Secondary | ICD-10-CM | POA: Diagnosis not present

## 2023-07-24 DIAGNOSIS — E8809 Other disorders of plasma-protein metabolism, not elsewhere classified: Secondary | ICD-10-CM | POA: Diagnosis not present

## 2023-07-24 DIAGNOSIS — I10 Essential (primary) hypertension: Secondary | ICD-10-CM | POA: Diagnosis not present

## 2023-07-24 DIAGNOSIS — E1169 Type 2 diabetes mellitus with other specified complication: Secondary | ICD-10-CM | POA: Diagnosis not present

## 2023-07-24 DIAGNOSIS — N401 Enlarged prostate with lower urinary tract symptoms: Secondary | ICD-10-CM | POA: Diagnosis not present

## 2023-07-24 DIAGNOSIS — J849 Interstitial pulmonary disease, unspecified: Secondary | ICD-10-CM | POA: Diagnosis not present

## 2023-07-24 DIAGNOSIS — E785 Hyperlipidemia, unspecified: Secondary | ICD-10-CM | POA: Diagnosis not present

## 2023-07-24 DIAGNOSIS — M353 Polymyalgia rheumatica: Secondary | ICD-10-CM | POA: Diagnosis not present

## 2023-07-24 DIAGNOSIS — D7589 Other specified diseases of blood and blood-forming organs: Secondary | ICD-10-CM | POA: Diagnosis not present

## 2023-07-24 DIAGNOSIS — D6949 Other primary thrombocytopenia: Secondary | ICD-10-CM | POA: Diagnosis not present

## 2023-07-30 ENCOUNTER — Other Ambulatory Visit: Payer: Self-pay | Admitting: Cardiovascular Disease

## 2023-07-31 DIAGNOSIS — M5414 Radiculopathy, thoracic region: Secondary | ICD-10-CM | POA: Diagnosis not present

## 2023-07-31 DIAGNOSIS — Z9889 Other specified postprocedural states: Secondary | ICD-10-CM | POA: Diagnosis not present

## 2023-07-31 DIAGNOSIS — M5416 Radiculopathy, lumbar region: Secondary | ICD-10-CM | POA: Diagnosis not present

## 2023-08-06 DIAGNOSIS — L609 Nail disorder, unspecified: Secondary | ICD-10-CM | POA: Diagnosis not present

## 2023-08-13 ENCOUNTER — Telehealth (INDEPENDENT_AMBULATORY_CARE_PROVIDER_SITE_OTHER): Payer: Self-pay | Admitting: Gastroenterology

## 2023-08-13 ENCOUNTER — Telehealth (INDEPENDENT_AMBULATORY_CARE_PROVIDER_SITE_OTHER): Payer: Self-pay

## 2023-08-13 DIAGNOSIS — K63829 Intestinal methanogen overgrowth, unspecified: Secondary | ICD-10-CM

## 2023-08-13 NOTE — Telephone Encounter (Signed)
 Pt asked to speak to the nurse about his prescription. He is going out of town and needed a refill sent to [facility]. [phone]

## 2023-08-13 NOTE — Telephone Encounter (Signed)
 I spoke with the patient please see telephone noted dated 08/13/2023.

## 2023-08-13 NOTE — Telephone Encounter (Signed)
 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  He says for the last three to four days he has had stomach cramping, diarrhea, and nausea, and he wants the two antibiotics he had been treated with in the past sent in to his Pharmacy. One was Neomycin and the other was the Xifaxan, and he would like these two medications sent into [facility] as he is going out of town on Monday and would like to have these on hand when he goes. I advised the last time we gave him samples, which we have none of in stock here at the office today. I advised when we send this in this co pay for the Xifaxan could cost him a lot. He still wants [place] to send in the Xifaxan and the Neomycin to his pharmacy

## 2023-08-19 ENCOUNTER — Telehealth (INDEPENDENT_AMBULATORY_CARE_PROVIDER_SITE_OTHER): Payer: Self-pay

## 2023-08-19 MED ORDER — RIFAXIMIN 550 MG PO TABS: 550.0000 mg | ORAL_TABLET | Freq: Three times a day (TID) | ORAL | 0 refills | Status: AC

## 2023-08-19 MED ORDER — NEOMYCIN SULFATE 500 MG PO TABS: 500.0000 mg | ORAL_TABLET | Freq: Two times a day (BID) | ORAL | 0 refills | Status: AC

## 2023-08-19 NOTE — Telephone Encounter (Signed)
 Med approved from 07-APR-25:21-APR-25 Xifaxan 550MG  OR TABS Quantity:42;

## 2023-08-19 NOTE — Telephone Encounter (Signed)
 Thanks, I have prescribed repeat course

## 2023-08-19 NOTE — Telephone Encounter (Signed)
 PA completed through cover my meds and approved. patient was notified.

## 2023-08-22 NOTE — Telephone Encounter (Signed)
 Error

## 2023-08-26 DIAGNOSIS — M8080XD Other osteoporosis with current pathological fracture, unspecified site, subsequent encounter for fracture with routine healing: Secondary | ICD-10-CM | POA: Diagnosis not present

## 2023-08-27 DIAGNOSIS — M4856XA Collapsed vertebra, not elsewhere classified, lumbar region, initial encounter for fracture: Secondary | ICD-10-CM | POA: Diagnosis not present

## 2023-08-27 DIAGNOSIS — M549 Dorsalgia, unspecified: Secondary | ICD-10-CM | POA: Diagnosis not present

## 2023-08-27 DIAGNOSIS — M5416 Radiculopathy, lumbar region: Secondary | ICD-10-CM | POA: Diagnosis not present

## 2023-08-27 DIAGNOSIS — M51379 Other intervertebral disc degeneration, lumbosacral region without mention of lumbar back pain or lower extremity pain: Secondary | ICD-10-CM | POA: Diagnosis not present

## 2023-08-27 DIAGNOSIS — M5414 Radiculopathy, thoracic region: Secondary | ICD-10-CM | POA: Diagnosis not present

## 2023-08-27 DIAGNOSIS — M5134 Other intervertebral disc degeneration, thoracic region: Secondary | ICD-10-CM | POA: Diagnosis not present

## 2023-09-18 DIAGNOSIS — Z9889 Other specified postprocedural states: Secondary | ICD-10-CM | POA: Diagnosis not present

## 2023-09-18 DIAGNOSIS — M5414 Radiculopathy, thoracic region: Secondary | ICD-10-CM | POA: Diagnosis not present

## 2023-09-18 DIAGNOSIS — M5416 Radiculopathy, lumbar region: Secondary | ICD-10-CM | POA: Diagnosis not present

## 2023-10-08 ENCOUNTER — Other Ambulatory Visit: Payer: Self-pay | Admitting: Cardiovascular Disease

## 2023-10-08 DIAGNOSIS — C44319 Basal cell carcinoma of skin of other parts of face: Secondary | ICD-10-CM | POA: Diagnosis not present

## 2023-10-08 DIAGNOSIS — X32XXXD Exposure to sunlight, subsequent encounter: Secondary | ICD-10-CM | POA: Diagnosis not present

## 2023-10-08 DIAGNOSIS — L570 Actinic keratosis: Secondary | ICD-10-CM | POA: Diagnosis not present

## 2023-10-08 DIAGNOSIS — L820 Inflamed seborrheic keratosis: Secondary | ICD-10-CM | POA: Diagnosis not present

## 2023-10-23 ENCOUNTER — Other Ambulatory Visit (INDEPENDENT_AMBULATORY_CARE_PROVIDER_SITE_OTHER): Payer: Self-pay | Admitting: Gastroenterology

## 2023-10-23 ENCOUNTER — Telehealth: Payer: Self-pay | Admitting: Gastroenterology

## 2023-10-23 MED ORDER — NEOMYCIN SULFATE 500 MG PO TABS: 500.0000 mg | ORAL_TABLET | Freq: Two times a day (BID) | ORAL | 0 refills | Status: AC

## 2023-10-23 MED ORDER — RIFAXIMIN 550 MG PO TABS: 550.0000 mg | ORAL_TABLET | Freq: Three times a day (TID) | ORAL | 0 refills | Status: DC

## 2023-10-23 NOTE — Telephone Encounter (Signed)
 PA received for Xifaxan  and completed, awaiting decision from insurance. Pt is using [facility]

## 2023-10-23 NOTE — Telephone Encounter (Signed)
 Received PA. Completed PA via Cover My Meds. Awaiting decision

## 2023-10-23 NOTE — Telephone Encounter (Signed)
 PA still awaiting determination. Contact pt wife and made her aware that we have sent medications to pharmacy and we are awaiting determination in regards to xifaxan . Pt wife verbalized understanding.

## 2023-10-23 NOTE — Telephone Encounter (Signed)
 Patient's wife stopped at front desk saying patient was having a flare up with nausea, stomach cramps and they were getting ready to go out of town and asked if we had samples of Xifaxin or something he could take. I told her we did not have samples of Xifaxin and I would see if the nurse could check to see what else would be available. [phone]

## 2023-10-23 NOTE — Telephone Encounter (Signed)
 Pt wife also left message before coming into office. Returned call to wife. Wife states that pt is having horrible nausea, stomach cramps and belching. Pt received samples of Xifaxan  the first time. Second time it was sent to pharmacy and they did not pick it up. Wife states the cost was going to be around $700 but at this time $700 doesn't seen so bad. Wife wanted to know if there was something comparable to Xifaxan  so they dont have to wait on the authorization. They are going out of town on Monday. Please advise. Thank you  [facility]

## 2023-10-24 NOTE — Telephone Encounter (Signed)
 PA denied. Pt wife contacted. Wife wanted to know if there was another medication that pt can use even if it is not as effective that the insurance would cover. Mentioned a savings card but pt wife states that to her knowledge [facility] does not do savings cards. Please advise. Thank you.

## 2023-10-24 NOTE — Telephone Encounter (Signed)
 Also placed a sticky note on samples reminding to pick up Neomycin  at pharmacy also

## 2023-10-24 NOTE — Telephone Encounter (Signed)
 Our office had 18 tablets. Sent Teams to [facility] and they have samples. Need 24 more tablets to add up to 42. Our samples are up front and samples at [facility] are up front. Pt wife contacted and made aware.

## 2023-11-19 DIAGNOSIS — D6949 Other primary thrombocytopenia: Secondary | ICD-10-CM | POA: Diagnosis not present

## 2023-11-19 DIAGNOSIS — M8580 Other specified disorders of bone density and structure, unspecified site: Secondary | ICD-10-CM | POA: Diagnosis not present

## 2023-11-19 DIAGNOSIS — E1169 Type 2 diabetes mellitus with other specified complication: Secondary | ICD-10-CM | POA: Diagnosis not present

## 2023-11-19 DIAGNOSIS — E8809 Other disorders of plasma-protein metabolism, not elsewhere classified: Secondary | ICD-10-CM | POA: Diagnosis not present

## 2023-11-19 DIAGNOSIS — E785 Hyperlipidemia, unspecified: Secondary | ICD-10-CM | POA: Diagnosis not present

## 2023-11-19 LAB — LAB REPORT - SCANNED
A1c: 5.3
Albumin, Urine POC: 8.7
Creatinine, POC: 138 mg/dL
EGFR: 83
Microalb Creat Ratio: 6

## 2023-11-20 LAB — MICROALBUMIN / CREATININE URINE RATIO
A1c: 5.3
Albumin, Urine POC: 8.7
Albumin/Creatinine Ratio, Urine, POC: 6
Creatinine, POC: 138 mg/dL

## 2023-11-20 LAB — COMPREHENSIVE METABOLIC PANEL WITH GFR: EGFR (Non-African Amer.): 83

## 2023-11-25 ENCOUNTER — Encounter: Payer: Self-pay | Admitting: Internal Medicine

## 2023-11-25 DIAGNOSIS — J849 Interstitial pulmonary disease, unspecified: Secondary | ICD-10-CM | POA: Diagnosis not present

## 2023-11-25 DIAGNOSIS — I480 Paroxysmal atrial fibrillation: Secondary | ICD-10-CM | POA: Diagnosis not present

## 2023-11-25 DIAGNOSIS — D7589 Other specified diseases of blood and blood-forming organs: Secondary | ICD-10-CM | POA: Diagnosis not present

## 2023-11-25 DIAGNOSIS — I2510 Atherosclerotic heart disease of native coronary artery without angina pectoris: Secondary | ICD-10-CM | POA: Diagnosis not present

## 2023-11-25 DIAGNOSIS — M353 Polymyalgia rheumatica: Secondary | ICD-10-CM | POA: Diagnosis not present

## 2023-11-25 DIAGNOSIS — E8809 Other disorders of plasma-protein metabolism, not elsewhere classified: Secondary | ICD-10-CM | POA: Diagnosis not present

## 2023-11-25 DIAGNOSIS — I10 Essential (primary) hypertension: Secondary | ICD-10-CM | POA: Diagnosis not present

## 2023-11-25 DIAGNOSIS — E778 Other disorders of glycoprotein metabolism: Secondary | ICD-10-CM | POA: Diagnosis not present

## 2023-11-25 DIAGNOSIS — E785 Hyperlipidemia, unspecified: Secondary | ICD-10-CM | POA: Diagnosis not present

## 2023-11-25 DIAGNOSIS — N401 Enlarged prostate with lower urinary tract symptoms: Secondary | ICD-10-CM | POA: Diagnosis not present

## 2023-11-25 DIAGNOSIS — M19011 Primary osteoarthritis, right shoulder: Secondary | ICD-10-CM | POA: Diagnosis not present

## 2023-11-26 ENCOUNTER — Encounter: Payer: Self-pay | Admitting: Internal Medicine

## 2023-11-29 ENCOUNTER — Encounter (INDEPENDENT_AMBULATORY_CARE_PROVIDER_SITE_OTHER): Payer: Self-pay | Admitting: *Deleted

## 2023-12-12 DIAGNOSIS — M961 Postlaminectomy syndrome, not elsewhere classified: Secondary | ICD-10-CM | POA: Diagnosis not present

## 2023-12-16 DIAGNOSIS — M5414 Radiculopathy, thoracic region: Secondary | ICD-10-CM | POA: Diagnosis not present

## 2023-12-19 ENCOUNTER — Telehealth: Payer: Self-pay

## 2023-12-19 NOTE — Telephone Encounter (Signed)
   Pre-operative Risk Assessment    Patient Name: [name]  DOB: 06-18-1945 MRN: [id number]   Date of last office visit: 07/05/2023, Dr. Debby Sor, MD Date of next office visit: NONE   Request for Surgical Clearance    Procedure:  SCS Placement   Date of Surgery:  Clearance TBD                                Surgeon: Dr. Victory Gens Surgeon's Group or Practice Name: [facility] Phone number: [phone] Fax number: [phone]   Type of Clearance Requested:   - Medical  - Pharmacy:  Hold Apixaban  (Eliquis )     Type of Anesthesia:  General    Additional requests/questions:    Bonney Asberry KANDICE Ethelene   12/19/2023, 12:25 PM

## 2023-12-25 ENCOUNTER — Other Ambulatory Visit (INDEPENDENT_AMBULATORY_CARE_PROVIDER_SITE_OTHER): Payer: Self-pay | Admitting: Gastroenterology

## 2023-12-26 ENCOUNTER — Other Ambulatory Visit: Payer: Self-pay | Admitting: Neurological Surgery

## 2023-12-27 ENCOUNTER — Telehealth: Payer: Self-pay

## 2023-12-27 NOTE — Telephone Encounter (Signed)
 Patient with diagnosis of atrial fibrillation on liquis for anticoagulation.    Procedure:  SCS Placement    Date of Surgery:  Clearance TBD    CHA2DS2-VASc Score = 3   This indicates a 3.2% annual risk of stroke. The patient's score is based upon: CHF History: 0 HTN History: 1 Diabetes History: 0 Stroke History: 0 Vascular Disease History: 0 Age Score: 2 Gender Score: 0   CrCl 80 Platelet count 176  Patient has not had an Afib/aflutter ablation within the last 3 months or DCCV within the last 30 days  Per office protocol, patient can hold eliquis  for 3 days prior to procedure.   Patient will not need bridging with Lovenox (enoxaparin) around procedure.  **This guidance is not considered finalized until pre-operative APP has relayed final recommendations.**

## 2023-12-27 NOTE — Telephone Encounter (Signed)
 Preop tele appt now scheduled, med rec and consent done.

## 2023-12-27 NOTE — Telephone Encounter (Signed)
  Patient Consent for Virtual Visit        [name] has provided verbal consent on 12/27/2023 for a virtual visit (video or telephone).   CONSENT FOR VIRTUAL VISIT FOR:  [name]  By participating in this virtual visit I agree to the following:  I hereby voluntarily request, consent and authorize [facility] and its employed or contracted physicians, physician assistants, nurse practitioners or other licensed health care professionals (the Practitioner), to provide me with telemedicine health care services (the "Services) as deemed necessary by the treating Practitioner. I acknowledge and consent to receive the Services by the Practitioner via telemedicine. I understand that the telemedicine visit will involve communicating with the Practitioner through live audiovisual communication technology and the disclosure of certain medical information by electronic transmission. I acknowledge that I have been given the opportunity to request an in-person assessment or other available alternative prior to the telemedicine visit and am voluntarily participating in the telemedicine visit.  I understand that I have the right to withhold or withdraw my consent to the use of telemedicine in the course of my care at any time, without affecting my right to future care or treatment, and that the Practitioner or I may terminate the telemedicine visit at any time. I understand that I have the right to inspect all information obtained and/or recorded in the course of the telemedicine visit and may receive copies of available information for a reasonable fee.  I understand that some of the potential risks of receiving the Services via telemedicine include:  Delay or interruption in medical evaluation due to technological equipment failure or disruption; Information transmitted may not be sufficient (e.g. poor resolution of images) to allow for appropriate medical decision making by the  Practitioner; and/or  In rare instances, security protocols could fail, causing a breach of personal health information.  Furthermore, I acknowledge that it is my responsibility to provide information about my medical history, conditions and care that is complete and accurate to the best of my ability. I acknowledge that Practitioner's advice, recommendations, and/or decision may be based on factors not within their control, such as incomplete or inaccurate data provided by me or distortions of diagnostic images or specimens that may result from electronic transmissions. I understand that the practice of medicine is not an exact science and that Practitioner makes no warranties or guarantees regarding treatment outcomes. I acknowledge that a copy of this consent can be made available to me via my patient portal [facility] MyChart), or I can request a printed copy by calling the office of [facility].    I understand that my insurance will be billed for this visit.   I have read or had this consent read to me. I understand the contents of this consent, which adequately explains the benefits and risks of the Services being provided via telemedicine.  I have been provided ample opportunity to ask questions regarding this consent and the Services and have had my questions answered to my satisfaction. I give my informed consent for the services to be provided through the use of telemedicine in my medical care

## 2023-12-27 NOTE — Telephone Encounter (Signed)
   Name: [name]  DOB: [birth year]  MRN: [id number]  Primary Cardiologist: Debby Sor, MD (Inactive)   Preoperative team, please contact this patient and set up a phone call appointment for further preoperative risk assessment. Please obtain consent and complete medication review. Thank you for your help.Last seen by Dr. Sor on 07/05/2023.   I confirm that guidance regarding antiplatelet and oral anticoagulation therapy has been completed and, if necessary, noted below.  Patient has not had an Afib/aflutter ablation within the last 3 months or DCCV within the last 30 days   Per office protocol, patient can hold eliquis  for 3 days prior to procedure.   Patient will not need bridging with Lovenox (enoxaparin) around procedure.  I also confirmed the patient resides in the state of [place] . As per [facility] Medical Board telemedicine laws, the patient must reside in the state in which the provider is licensed.   Lamarr Satterfield, NP 12/27/2023, 9:51 AM [facility]

## 2024-01-07 ENCOUNTER — Encounter: Payer: Self-pay | Admitting: Nurse Practitioner

## 2024-01-07 ENCOUNTER — Ambulatory Visit: Attending: Cardiology | Admitting: Nurse Practitioner

## 2024-01-07 DIAGNOSIS — Z01810 Encounter for preprocedural cardiovascular examination: Secondary | ICD-10-CM

## 2024-01-07 NOTE — Progress Notes (Signed)
 Virtual Visit via Telephone Note   Because of [name] co-morbid illnesses, he is at least at moderate risk for complications without adequate follow up.  This format is felt to be most appropriate for this patient at this time.  Due to technical limitations with video connection (technology), today's appointment will be conducted as an audio only telehealth visit, and [name] verbally agreed to proceed in this manner.   All issues noted in this document were discussed and addressed.  No physical exam could be performed with this format.  Evaluation Performed:  Preoperative cardiovascular risk assessment _____________   Date:  01/07/2024   Patient ID:  [name], DOB [birth year], MRN [id number] Patient Location:  Home Provider location:   Office  Primary Care Provider:  Shona Norleen PEDLAR, MD Primary Cardiologist:  Debby Sor, MD (Inactive)  Chief Complaint / Patient Profile   78 y.o. y/o male with a h/o HLD, aortic atherosclerosis, mild mid systolic LAD bridging on cath in 2001, PAF on chronic anticoagulation, HTN, exertional dyspnea and leg swelling, interstitial lung disease, alpha gal deficiency, OSA who is pending SCS placement with Dr. Colon on date TBD and presents today for telephonic preoperative cardiovascular risk assessment.  History of Present Illness    [name] is a 78 y.o. male who presents via audio/video conferencing for a telehealth visit today.  Pt was last seen in cardiology clinic on 07/05/23 by Dr. Sor.  At that time [name] was doing well .  The patient is now pending procedure as outlined above. Since his last visit, he denies chest pain, lower extremity edema, fatigue, palpitations, melena, hematuria, hemoptysis, diaphoresis, weakness, presyncope, syncope, orthopnea, and PND. He reports history of shortness of breath secondary to pulmonary fibrosis that he feels is stable. He denies orthopnea, PND,  edema, or chest pain. He is limited by back pain. When wearing the single lead spinal cord stimulator approximately 3 weeks ago, he was able to walk around his large yard without back pain. He is able to achieve > 4 METS activity without concerning cardiac symptoms.   Past Medical History    Past Medical History:  Diagnosis Date   Allergy to alpha-gal    2015; has been able to re-introduce red meat for the past 2 1/2 years without reaction (as of 01/21/19)   Arthritis    Chest pain    a. 03/2017: echo showing EF of 60-65%, no regional WMA or significant valve abnormalities. b. 03/2016: NST with no evidence of ischemia.    Complication of anesthesia    anaphylactic reaction to Xylocaine  > 10 years ago; reported later recieved marcaine  without issue   Difficult intubation 02/19/2018   No difficulty 01/26/19 with Mac and 3 or 11/10/19 with Glidescope   Digestive problems    on Prednisone  prn for this issue- diarrhea   Dyspnea    pulmonary fibrosis   Dysrhythmia    irregular due to Myocarditis- takes Atenolol , Norvasc    GERD (gastroesophageal reflux disease)    H/O viral myocarditis    25 years ago   Head injury, closed, with concussion    Breif LOC   Hyperlipidemia    Hypertension    Inguinal hernia    Mild mitral valve prolapse    per Dr Joesphine notes   PAF (paroxysmal atrial fibrillation) (HCC)    a. initially occuring in 02/2016. b. recurrent in 07/2016. Placed on Eliquis    Polymyalgia rheumatica (HCC)    PONV (postoperative  nausea and vomiting)    Pre-diabetes    elevated due to high dose of prednsione for back no longer elevated   Pulmonary fibrosis (HCC)    Sleep apnea    no cpap mild   Past Surgical History:  Procedure Laterality Date   ANTERIOR CERVICAL DECOMP/DISCECTOMY FUSION N/A 11/10/2019   Procedure: Cervical Three-Four Anterior cervical decompression/discectomy/fusion;  Surgeon: Colon Shove, MD;  Location: [facility] OR;  Service: Neurosurgery;  Laterality: N/A;   Cervical Three-Four Anterior cervical decompression/discectomy/fusion   apendectomy  1965   APPENDECTOMY     APPLICATION OF ROBOTIC ASSISTANCE FOR SPINAL PROCEDURE N/A 12/22/2020   Procedure: APPLICATION OF ROBOTIC ASSISTANCE FOR SPINAL PROCEDURE;  Surgeon: Colon Shove, MD;  Location: [facility] OR;  Service: Neurosurgery;  Laterality: N/A;   BACK SURGERY     BIOPSY  04/18/2023   Procedure: BIOPSY;  Surgeon: Cinderella Deatrice FALCON, MD;  Location: [facility] ENDO SUITE;  Service: Endoscopy;;   bone spur Bilateral 1999   feet   CARDIAC CATHETERIZATION  2001   CHOLECYSTECTOMY  2004   COLON RESECTION N/A 06/15/2018   Procedure: SIGMOID COLON RESECTION;  Surgeon: Curvin Deward MOULD, MD;  Location: [facility] ORS;  Service: General;  Laterality: N/A;   COLONOSCOPY     COLOSTOMY N/A 06/15/2018   Procedure: COLOSTOMY;  Surgeon: Curvin Deward MOULD, MD;  Location: [facility] ORS;  Service: General;  Laterality: N/A;   COLOSTOMY TAKEDOWN N/A 01/26/2019   Procedure: LAPAROSCOPIC ASSISTED COLOSTOMY TAKEDOWN;  Surgeon: Curvin Deward MOULD, MD;  Location: [facility] OR;  Service: General;  Laterality: N/A;   DG GREAT TOE LEFT FOOT Bilateral    bone spurs   ESOPHAGOGASTRODUODENOSCOPY (EGD) WITH PROPOFOL  N/A 04/18/2023   Procedure: ESOPHAGOGASTRODUODENOSCOPY (EGD) WITH PROPOFOL ;  Surgeon: Cinderella Deatrice FALCON, MD;  Location: [facility];  Service: Endoscopy;  Laterality: N/A;  2:05PM;ASA 1-2   EYE SURGERY Bilateral    cataract removal   LAPAROSCOPIC LYSIS OF ADHESIONS N/A 01/26/2019   Procedure: LAPAROSCOPIC LYSIS OF ADHESIONS;  Surgeon: Curvin Deward MOULD, MD;  Location: [facility] OR;  Service: General;  Laterality: N/A;   LAPAROTOMY N/A 06/15/2018   Procedure: EXPLORATORY LAPAROTOMY;  Surgeon: Curvin Deward MOULD, MD;  Location: [facility] ORS;  Service: General;  Laterality: N/A;   LUMBAR LAMINECTOMY/ DECOMPRESSION WITH MET-RX Right 05/05/2013   Procedure: Right Lumbar three-four Extraforaminal Microdiskectomy with Metrex;  Surgeon: Shove Colon, MD;  Location: [facility] NEURO ORS;   Service: Neurosurgery;  Laterality: Right;  Right Lumbar three-four Extraforaminal Microdiskectomy with Metrex   LUNG BIOPSY Right 02/19/2018   Procedure: LUNG BIOPSY;  Surgeon: Kerrin Elspeth BROCKS, MD;  Location: [facility] OR;  Service: Thoracic;  Laterality: Right;   POSTERIOR LUMBAR FUSION 4 LEVEL N/A 12/22/2020   Procedure: Lumbar one-two Posterior lumbar interbody fusion with stabilization from Thoracic ten to Lumbar one with robotic screw placement;  Surgeon: Colon Shove, MD;  Location: [facility] OR;  Service: Neurosurgery;  Laterality: N/A;  RM 20   REVERSE SHOULDER ARTHROPLASTY Right 07/11/2022   Procedure: REVERSE SHOULDER ARTHROPLASTY;  Surgeon: Cristy Bonner DASEN, MD;  Location: [facility] ORS;  Service: Orthopedics;  Laterality: Right;   SHOULDER OPEN ROTATOR CUFF REPAIR Bilateral 2001   TONSILLECTOMY     VIDEO ASSISTED THORACOSCOPY Right 02/19/2018   Procedure: VIDEO ASSISTED THORACOSCOPY;  Surgeon: Kerrin Elspeth BROCKS, MD;  Location: [facility] OR;  Service: Thoracic;  Laterality: Right;    Allergies  Allergies  Allergen Reactions   Xylocaine  [Lidocaine ] Anaphylaxis    Injection form   Codeine Nausea And Vomiting  Statins Other (See Comments)    Muscle soreness and an aching feeling all over Myalgias   Aldactone  [Spironolactone ]     Sore nipples and chest   Alpha-Gal     2015; has been able to re-introduce red meat for the past 2 1/2 years without reaction (as of 01/21/19)   Pirfenidone  Other (See Comments)    GI side effects   Percocet [Oxycodone -Acetaminophen ] Itching    Home Medications    Prior to Admission medications   Medication Sig Start Date End Date Taking? Authorizing Provider  acetaminophen  (ACETAMINOPHEN  8 HOUR) 650 MG CR tablet Take 650 mg by mouth every 8 (eight) hours as needed for pain.    [provider]  cetirizine (ZYRTEC) 10 MG tablet Take 10 mg by mouth daily as needed for allergies.     [provider]  Cholecalciferol (DIALYVITE VITAMIN D  5000) 125 MCG  (5000 UT) capsule Take 5,000 Units by mouth daily.    [provider]  diltiazem  (CARDIZEM  CD) 120 MG 24 hr capsule TAKE ONE CAPSULE BY MOUTH DAILY 07/30/23   Burnard Debby LABOR, MD  diphenoxylate -atropine  (LOMOTIL ) 2.5-0.025 MG tablet Take 1 tablet by mouth once as needed for diarrhea or loose stools.    [provider]  ELIQUIS  5 MG TABS tablet TAKE ONE TABLET (5MG  TOTAL) BY MOUTH TWODAILY Patient taking differently: Take 5 mg by mouth 2 (two) times daily. 01/28/18   Burnard Debby LABOR, MD  ezetimibe  (ZETIA ) 10 MG tablet TAKE ONE (1) TABLET BY MOUTH EVERY DAY 07/30/23   Burnard Debby LABOR, MD  fluticasone  (FLONASE ) 50 MCG/ACT nasal spray Place 2 sprays into both nostrils daily as needed for allergies or rhinitis.    [provider]  hydroxychloroquine  (PLAQUENIL ) 200 MG tablet Take 200 mg by mouth 2 (two) times daily. 09/22/20   [provider]  metoprolol  tartrate (LOPRESSOR ) 25 MG tablet TAKE ONE TABLET BY MOUTH TWICE A DAY 02/25/18   Burnard Debby LABOR, MD  omeprazole  (PRILOSEC) 40 MG capsule TAKE ONE CAPSULE (40 MG TOTAL) BY MOUTH DAILY. 12/25/23   Ahmed, Muhammad F, MD  ondansetron  (ZOFRAN ) 8 MG tablet 8 mg every 8 (eight) hours as needed. 06/01/23   [provider]  predniSONE  (DELTASONE ) 5 MG tablet Take 5 mg by mouth daily.     [provider]  rifaximin  (XIFAXAN ) 550 MG TABS tablet Take 1 tablet (550 mg total) by mouth 3 (three) times daily. 10/23/23   Carlan, Chelsea L, NP  rosuvastatin  (CRESTOR ) 10 MG tablet TAKE ONE-HALF TABLET (5MG  TOTAL) BY MOUTH ONCE A WEEK 10/10/23   Burnard Debby LABOR, MD  tamsulosin  (FLOMAX ) 0.4 MG CAPS capsule Take 0.4 mg by mouth 2 (two) times daily.     [provider]    Physical Exam    Vital Signs:  [name] does not have vital signs available for review today.  Given telephonic nature of communication, physical exam is limited. AAOx3. NAD. Normal affect.  Speech and respirations are  unlabored.  Accessory Clinical Findings    None  Assessment & Plan    1.  Preoperative Cardiovascular Risk Assessment: According to the Revised Cardiac Risk Index (RCRI), his Perioperative Risk of Major Cardiac Event is (%): 0.9. His Functional Capacity in METs is: 4.4 according to the Duke Activity Status Index (DASI). The patient is doing well from a cardiac perspective. Therefore, based on ACC/AHA guidelines, the patient would be at acceptable risk for the planned procedure without further cardiovascular testing.  The patient was advised that if he develops new symptoms prior to surgery to contact our office to arrange for a follow-up visit, and he verbalized understanding.  Per office protocol, patient can hold eliquis  for 3 days prior to procedure.   Patient will not need bridging with Lovenox (enoxaparin) around procedure.  A copy of this note will be routed to requesting surgeon.  Time:   Today, I have spent 10 minutes with the patient with telehealth technology discussing medical history, symptoms, and management plan.     Randall EMERSON Bane, NP-C  01/07/2024, 2:04 PM [address] [place], [place] [address] Office [phone] Fax [phone]

## 2024-01-08 NOTE — Progress Notes (Signed)
 Surgical Instructions   Your procedure is scheduled on Tuesday, September 2nd. Report to [facility] Main Entrance A at 6:30 A.M., then check in with the Admitting office. Any questions or running late day of surgery: call [phone]  Questions prior to your surgery date: call [phone], Monday-Friday, 8am-4pm. If you experience any cold or flu symptoms such as cough, fever, chills, shortness of breath, etc. between now and your scheduled surgery, please notify us  at the above number.     Remember:  Do not eat or drink after midnight the night before your surgery  Take these medicines the morning of surgery with A SIP OF WATER   acetaminophen  (ACETAMINOPHEN  8 HOUR)  diltiazem  (CARDIZEM  CD)  ezetimibe  (ZETIA )  metoprolol  tartrate (LOPRESSOR )  predniSONE  (DELTASONE )  tamsulosin  (FLOMAX )    May take these medicines IF NEEDED: cetirizine (ZYRTEC)  diphenoxylate -atropine  (LOMOTIL )  fluticasone  (FLONASE )  omeprazole  (PRILOSEC)  ondansetron  (ZOFRAN )    Follow your surgeon's instructions, HOLD your ELIQUIS  for 3 days prior to your surgery. Last dose should be taken on Friday, August 29th.   One week prior to surgery, STOP taking any Aspirin  (unless otherwise instructed by your surgeon) Aleve, Naproxen, Ibuprofen, Motrin, Advil, Goody's, BC's, all herbal medications, fish oil , and non-prescription vitamins.                     Do NOT Smoke (Tobacco/Vaping) for 24 hours prior to your procedure.  If you use a CPAP at night, you may bring your mask/headgear for your overnight stay.   You will be asked to remove any contacts, glasses, piercing's, hearing aid's, dentures/partials prior to surgery. Please bring cases for these items if needed.    Patients discharged the day of surgery will not be allowed to drive home, and someone needs to stay with them for 24 hours.  SURGICAL WAITING ROOM VISITATION Patients may have no more than 2 support people in the waiting area - these  visitors may rotate.   Pre-op nurse will coordinate an appropriate time for 1 ADULT support person, who may not rotate, to accompany patient in pre-op.  Children under the age of 39 must have an adult with them who is not the patient and must remain in the main waiting area with an adult.  If the patient needs to stay at the hospital during part of their recovery, the visitor guidelines for inpatient rooms apply.  Please refer to the [facility] website for the visitor guidelines for any additional information.   If you received a COVID test during your pre-op visit  it is requested that you wear a mask when out in public, stay away from anyone that may not be feeling well and notify your surgeon if you develop symptoms. If you have been in contact with anyone that has tested positive in the last 10 days please notify you surgeon.      Pre-operative 5 CHG Bathing Instructions   You can play a key role in reducing the risk of infection after surgery. Your skin needs to be as free of germs as possible. You can reduce the number of germs on your skin by washing with CHG (chlorhexidine  gluconate) soap before surgery. CHG is an antiseptic soap that kills germs and continues to kill germs even after washing.   DO NOT use if you have an allergy to chlorhexidine /CHG or antibacterial soaps. If your skin becomes reddened or irritated, stop using the CHG and notify one of our RNs at [phone].  Please shower with the CHG soap starting 4 days before surgery using the following schedule:     Please keep in mind the following:  DO NOT shave, including legs and underarms, starting the day of your first shower.   You may shave your face at any point before/day of surgery.  Place clean sheets on your bed the day you start using CHG soap. Use a clean washcloth (not used since being washed) for each shower. DO NOT sleep with pets once you start using the CHG.   CHG Shower Instructions:  Wash your  face and private area with normal soap. If you choose to wash your hair, wash first with your normal shampoo.  After you use shampoo/soap, rinse your hair and body thoroughly to remove shampoo/soap residue.  Turn the water  OFF and apply about 3 tablespoons (45 ml) of CHG soap to a CLEAN washcloth.  Apply CHG soap ONLY FROM YOUR NECK DOWN TO YOUR TOES (washing for 3-5 minutes)  DO NOT use CHG soap on face, private areas, open wounds, or sores.  Pay special attention to the area where your surgery is being performed.  If you are having back surgery, having someone wash your back for you may be helpful. Wait 2 minutes after CHG soap is applied, then you may rinse off the CHG soap.  Pat dry with a clean towel  Put on clean clothes/pajamas   If you choose to wear lotion, please use ONLY the CHG-compatible lotions that are listed below.  Additional instructions for the day of surgery: DO NOT APPLY any lotions, deodorants, cologne, or perfumes.   Do not bring valuables to the hospital. [facility] is not responsible for any belongings/valuables. Do not wear nail polish, gel polish, artificial nails, or any other type of covering on natural nails (fingers and toes) Do not wear jewelry or makeup Put on clean/comfortable clothes.  Please brush your teeth.  Ask your nurse before applying any prescription medications to the skin.     CHG Compatible Lotions   Aveeno Moisturizing lotion  Cetaphil Moisturizing Cream  Cetaphil Moisturizing Lotion  Clairol Herbal Essence Moisturizing Lotion, Dry Skin  Clairol Herbal Essence Moisturizing Lotion, Extra Dry Skin  Clairol Herbal Essence Moisturizing Lotion, Normal Skin  Curel Age Defying Therapeutic Moisturizing Lotion with Alpha Hydroxy  Curel Extreme Care Body Lotion  Curel Soothing Hands Moisturizing Hand Lotion  Curel Therapeutic Moisturizing Cream, Fragrance-Free  Curel Therapeutic Moisturizing Lotion, Fragrance-Free  Curel Therapeutic  Moisturizing Lotion, Original Formula  Eucerin Daily Replenishing Lotion  Eucerin Dry Skin Therapy Plus Alpha Hydroxy Crme  Eucerin Dry Skin Therapy Plus Alpha Hydroxy Lotion  Eucerin Original Crme  Eucerin Original Lotion  Eucerin Plus Crme Eucerin Plus Lotion  Eucerin TriLipid Replenishing Lotion  Keri Anti-Bacterial Hand Lotion  Keri Deep Conditioning Original Lotion Dry Skin Formula Softly Scented  Keri Deep Conditioning Original Lotion, Fragrance Free Sensitive Skin Formula  Keri Lotion Fast Absorbing Fragrance Free Sensitive Skin Formula  Keri Lotion Fast Absorbing Softly Scented Dry Skin Formula  Keri Original Lotion  Keri Skin Renewal Lotion Keri Silky Smooth Lotion  Keri Silky Smooth Sensitive Skin Lotion  Nivea Body Creamy Conditioning Oil  Nivea Body Extra Enriched Teacher, adult education Moisturizing Lotion Nivea Crme  Nivea Skin Firming Lotion  NutraDerm 30 Skin Lotion  NutraDerm Skin Lotion  NutraDerm Therapeutic Skin Cream  NutraDerm Therapeutic Skin Lotion  ProShield Protective Hand Cream  Provon moisturizing lotion  Please read  over the following fact sheets that you were given.

## 2024-01-09 ENCOUNTER — Encounter (HOSPITAL_COMMUNITY): Payer: Self-pay

## 2024-01-09 ENCOUNTER — Encounter (HOSPITAL_COMMUNITY)
Admission: RE | Admit: 2024-01-09 | Discharge: 2024-01-09 | Disposition: A | Discharge status: Home / Self Care | Source: Ambulatory Visit | Attending: Neurological Surgery | Admitting: Neurological Surgery

## 2024-01-09 ENCOUNTER — Other Ambulatory Visit: Payer: Self-pay

## 2024-01-09 VITALS — BP 100/67 | HR 75 | Temp 98.6°F | Resp 17 | Ht 68.0 in | Wt 184.2 lb

## 2024-01-09 DIAGNOSIS — I480 Paroxysmal atrial fibrillation: Secondary | ICD-10-CM | POA: Diagnosis not present

## 2024-01-09 DIAGNOSIS — G4733 Obstructive sleep apnea (adult) (pediatric): Secondary | ICD-10-CM | POA: Insufficient documentation

## 2024-01-09 DIAGNOSIS — Z01812 Encounter for preprocedural laboratory examination: Secondary | ICD-10-CM | POA: Diagnosis not present

## 2024-01-09 DIAGNOSIS — I10 Essential (primary) hypertension: Secondary | ICD-10-CM | POA: Diagnosis not present

## 2024-01-09 DIAGNOSIS — K219 Gastro-esophageal reflux disease without esophagitis: Secondary | ICD-10-CM | POA: Insufficient documentation

## 2024-01-09 DIAGNOSIS — K760 Fatty (change of) liver, not elsewhere classified: Secondary | ICD-10-CM | POA: Diagnosis not present

## 2024-01-09 DIAGNOSIS — Z7901 Long term (current) use of anticoagulants: Secondary | ICD-10-CM | POA: Insufficient documentation

## 2024-01-09 DIAGNOSIS — I700 Atherosclerosis of aorta: Secondary | ICD-10-CM | POA: Insufficient documentation

## 2024-01-09 DIAGNOSIS — R0609 Other forms of dyspnea: Secondary | ICD-10-CM | POA: Diagnosis not present

## 2024-01-09 DIAGNOSIS — Z01818 Encounter for other preprocedural examination: Secondary | ICD-10-CM

## 2024-01-09 LAB — BASIC METABOLIC PANEL WITH GFR
Anion gap: 12 (ref 5–15)
BUN: 8 mg/dL (ref 8–23)
CO2: 23 mmol/L (ref 22–32)
Calcium: 8.9 mg/dL (ref 8.9–10.3)
Chloride: 105 mmol/L (ref 98–111)
Creatinine, Ser: 0.88 mg/dL (ref 0.61–1.24)
GFR, Estimated: >60 mL/min (ref >60)
Glucose, Bld: 82 mg/dL (ref 70–99)
Potassium: 4 mmol/L (ref 3.5–5.1)
Sodium: 140 mmol/L (ref 135–145)

## 2024-01-09 LAB — CBC
HCT: 44.6 % (ref 39.0–52.0)
Hemoglobin: 15.6 g/dL (ref 13.0–17.0)
MCH: 35.1 pg — ABNORMAL HIGH (ref 26.0–34.0)
MCHC: 35 g/dL (ref 30.0–36.0)
MCV: 100.5 fL — ABNORMAL HIGH (ref 80.0–100.0)
Platelets: 223 K/uL (ref 150–400)
RBC: 4.44 MIL/uL (ref 4.22–5.81)
RDW: 13.1 % (ref 11.5–15.5)
WBC: 6.8 K/uL (ref 4.0–10.5)
nRBC: 0 % (ref 0.0–0.2)

## 2024-01-09 LAB — SURGICAL PCR SCREEN
MRSA, PCR: NEGATIVE
Staphylococcus aureus: NEGATIVE

## 2024-01-09 NOTE — Progress Notes (Signed)
 Per patient Patient states has not had a problem with  intubation for the last 2 surgeries last few surgeries.   PCP - Dr. Norleen Hurst MD Cardiologist - Dr. Rosaline Bane NP (phone visit 01/07/24) Cathy Sor MD retired; referred to Dr. Lonni Mas) LOV 07-05-23 Follow up in Oct/Nov of 2025.  PPM/ICD - Denies Device Orders - n/a Rep Notified - n/a  Chest x-ray - N/A EKG - 07-05-23 Stress Test - 03-21-16 ECHO - 06-21-23 Cardiac Cath - 05-24-99  Sleep Study - Yes; 12/05/26 CPAP - No, did not tolerate  NON-diabetic  Last dose of GLP1 agonist-  Denies GLP1 instructions: n/a  Blood Thinner Instructions: Eliquis  - per patient per physician he stopped his eliquis  on Wednesday, August 27th. Aspirin  Instructions: Denies  ERAS Protcol - NPO PRE-SURGERY Ensure or G2- n/a   COVID TEST- n/a   Anesthesia review: Yes, difficult airway / intubation, mitral valve prolapse, HTN, PAF, Sleep apnea - no cpap.   Patient denies shortness of breath, fever, cough and chest pain at PAT appointment. Patient denies any respiratory issues at this time.    All instructions explained to the patient, with a verbal understanding of the material. Patient agrees to go over the instructions while at home for a better understanding. Patient also instructed to self quarantine after being tested for COVID-19. The opportunity to ask questions was provided.

## 2024-01-10 NOTE — Progress Notes (Signed)
 Anesthesia Chart Review:  78 yo male follows with cardiology for hx of HLD, aortic atherosclerosis, mild mid systolic LAD bridging on cath in 2001, PAF on chronic anticoagulation, HTN, exertional dyspnea and leg swelling. Nuclear stress 03/21/2016 was low risk. Echo 12/2021 showed EF 55-60%, grade 1 dd, normal RV, no significant vavlular abnormalities. Seen by Rosaline Bane, NP on 01/07/24 for preop eval. Per note,  Preoperative Cardiovascular Risk Assessment: According to the Revised Cardiac Risk Index (RCRI), his Perioperative Risk of Major Cardiac Event is (%): 0.9. His Functional Capacity in METs is: 4.4 according to the Duke Activity Status Index (DASI). The patient is doing well from a cardiac perspective. Therefore, based on ACC/AHA guidelines, the patient would be at acceptable risk for the planned procedure without further cardiovascular testing. The patient was advised that if he develops new symptoms prior to surgery to contact our office to arrange for a follow-up visit, and he verbalized understanding. Per office protocol, patient can hold eliquis  for 3 days prior to procedure. Patient will not need bridging with Lovenox (enoxaparin) around procedure.  Follows with pulmonology for hx of ILD. Last seen by Dr. Geronimo 07/11/22. Spirometry done at that time showed: The FVC, FEV1, FEV1/ FVC ratio and FEF25- 75% are within normal limits. The reduced diffusing capacity indicates a moderate loss of functional alveolar capillary surface. Conclusions: The diffusion defect is consistent with a pulmonary vascular process. In view of the severity of the diffusion defect, studies with exercise would be helpful to evaluate the presence of hypoxemia. Pulmonary Function Diagnosis: Moderate Diffusion Defect - Pulmonary Vascular. Per OV note, -Clinically  you have NON-IPF variety of fibrosis -PFT , symbomt sand CT stable x 18 months (though worse since 2022) - You are currently on supportive care due to  pirfenidone  and nintedanib  and CellCept  intolerance. 6 month followup recommended.   Other pertinent hx includes PONV,  GERD on PPI, polymyalgia rheumatics, OSA intolerant to CPAP, allergy to alpha gal.   Reported history of difficult intubation. Glidescope has been used electively at times. For most recent intubation 07/11/22, glidescope was not used. Per intubation note:  Performed by: Williford, Peggy D, CRNAPre-anesthesia Checklist: Patient identified, Emergency Drugs available, Suction available and Patient being monitored Patient Re-evaluated:Patient Re-evaluated prior to induction Oxygen  Delivery Method: Circle system utilized Preoxygenation: Pre-oxygenation with 100% oxygen  Induction Type: IV induction Ventilation: Mask ventilation without difficulty Laryngoscope Size: Mac and 4 Grade View: Grade III Tube type: Oral Tube size: 7.5 mm Number of attempts: 1 Airway Equipment and Method: Stylet and Oral airway Placement Confirmation: ETT inserted through vocal cords under direct vision, positive ETCO2 and breath sounds checked- equal and bilateral Secured at: 22 cm Tube secured with: Tape Dental Injury: Teeth and Oropharynx as per pre-operative assessment   Pt reports LD Eliquis  01/08/24.  Preop labs reviewed, unremarkable.   EKG 07/05/23: Normal sinus rhythm. Rate 80. Low voltage QRS. Nonspecific T wave abnormality  HRCT Chest 06/21/23: IMPRESSION: 1. Unchanged mild pulmonary fibrosis in a pattern with apical to basal gradient, featuring irregular peripheral interstitial opacity scattered ground-glass, and tubular bronchiectasis in the lung bases. No evidence of subpleural bronchiolectasis or honeycombing. Findings remain indeterminate for UIP per consensus guidelines: Diagnosis of Idiopathic Pulmonary Fibrosis: An Official ATS/ERS/JRS/ALAT Clinical Practice Guideline. Am JINNY Honey Crit Care Med Vol 198, Iss 5, [phone], Jan 12 2017. 2. Mild, lobular air trapping on expiratory  phase imaging, suggesting small airways disease, or perhaps alternately a component of interstitial lung disease. 3. Hepatic steatosis.  TTE 01/08/22:  1. Left ventricular ejection fraction, by estimation, is 55 to 60%. The  left ventricle has normal function. The left ventricle has no regional  wall motion abnormalities. Left ventricular diastolic parameters are  consistent with Grade I diastolic  dysfunction (impaired relaxation).   2. Right ventricular systolic function is normal. The right ventricular  size is normal.   3. The mitral valve is normal in structure. No evidence of mitral valve  regurgitation. No evidence of mitral stenosis.   4. The aortic valve is tricuspid. Aortic valve regurgitation is not  visualized. No aortic stenosis is present.   5. The inferior vena cava is normal in size with greater than 50%  respiratory variability, suggesting right atrial pressure of 3 mmHg.   Comparison(s): No significant change from prior study. Prior images  reviewed side by side.   Conclusion(s)/Recommendation(s): Normal biventricular function without  evidence of hemodynamically significant valvular heart disease.   Nuclear stress 03/21/2016: The left ventricular ejection fraction is normal (55-65%). Nuclear stress EF: 61%. The study is normal. This is a low risk study. There was no ST segment deviation noted during stress.     Lynwood Geofm RIGGERS [facility] Short Stay Center/Anesthesiology Phone [phone] 01/10/2024 9:57 AM

## 2024-01-10 NOTE — Anesthesia Preprocedure Evaluation (Addendum)
 Anesthesia Evaluation  Patient identified by MRN, date of birth, ID band Patient awake    Reviewed: Allergy & Precautions, H&P , NPO status , Patient's Chart, lab work & pertinent test results  History of Anesthesia Complications (+) PONV, DIFFICULT AIRWAY and history of anesthetic complications  Airway Mallampati: III  TM Distance: >3 FB Neck ROM: Full    Dental no notable dental hx.    Pulmonary sleep apnea  Pulmonary fibrosis   Pulmonary exam normal breath sounds clear to auscultation       Cardiovascular hypertension, (-) angina (-) Past MI Normal cardiovascular exam+ dysrhythmias Atrial Fibrillation  Rhythm:Regular Rate:Normal     Neuro/Psych negative neurological ROS  negative psych ROS   GI/Hepatic Neg liver ROS,GERD  ,,  Endo/Other  diabetes, Type 2    Renal/GU negative Renal ROS  negative genitourinary   Musculoskeletal  (+) Arthritis ,    Abdominal   Peds negative pediatric ROS (+)  Hematology negative hematology ROS (+)   Anesthesia Other Findings   Reproductive/Obstetrics negative OB ROS                              Anesthesia Physical Anesthesia Plan  ASA: 3  Anesthesia Plan: General   Post-op Pain Management: Tylenol  PO (pre-op)*   Induction: Intravenous  PONV Risk Score and Plan: 3 and Ondansetron , Dexamethasone , Midazolam  and Treatment may vary due to age or medical condition  Airway Management Planned: Oral ETT  Additional Equipment: None  Intra-op Plan:   Post-operative Plan: Extubation in OR  Informed Consent: I have reviewed the patients History and Physical, chart, labs and discussed the procedure including the risks, benefits and alternatives for the proposed anesthesia with the patient or authorized representative who has indicated his/her understanding and acceptance.     Dental advisory given  Plan Discussed with: CRNA  Anesthesia Plan  Comments: (PAT note by Lynwood Hope, PA-C: 78 yo male follows with cardiology for hx of HLD, aortic atherosclerosis, mild mid systolic LAD bridging on cath in 2001, PAF on chronic anticoagulation, HTN, exertional dyspnea and leg swelling. Nuclear stress 03/21/2016 was low risk. Echo 12/2021 showed EF 55-60%, grade 1 dd, normal RV, no significant vavlular abnormalities. Seen by Rosaline Bane, NP on 01/07/24 for preop eval. Per note,  Preoperative Cardiovascular Risk Assessment: According to the Revised Cardiac Risk Index (RCRI), his Perioperative Risk of Major Cardiac Event is (%): 0.9. His Functional Capacity in METs is: 4.4 according to the Duke Activity Status Index (DASI). The patient is doing well from a cardiac perspective. Therefore, based on ACC/AHA guidelines, the patient would be at acceptable risk for the planned procedure without further cardiovascular testing. The patient was advised that if he develops new symptoms prior to surgery to contact our office to arrange for a follow-up visit, and he verbalized understanding. Per office protocol, patient can hold eliquis  for 3 days prior to procedure. Patient will not need bridging with Lovenox (enoxaparin) around procedure.  Follows with pulmonology for hx of ILD. Last seen by Dr. Geronimo 07/11/22. Spirometry done at that time showed: The FVC, FEV1, FEV1/ FVC ratio and FEF25- 75% are within normal limits. The reduced diffusing capacity indicates a moderate loss of functional alveolar capillary surface. Conclusions: The diffusion defect is consistent with a pulmonary vascular process. In view of the severity of the diffusion defect, studies with exercise would be helpful to evaluate the presence of hypoxemia. Pulmonary Function Diagnosis: Moderate Diffusion Defect -  Pulmonary Vascular. Per OV note, -Clinically  you have NON-IPF variety of fibrosis -PFT , symbomt sand CT stable x 18 months (though worse since 2022) - You are currently on supportive care  due to pirfenidone  and nintedanib  and CellCept  intolerance. 6 month followup recommended.   Other pertinent hx includes PONV,  GERD on PPI, polymyalgia rheumatics, OSA intolerant to CPAP, allergy to alpha gal.   Reported history of difficult intubation. Glidescope has been used electively at times. For most recent intubation 07/11/22, glidescope was not used. Per intubation note:  Performed by: Williford, Peggy D, CRNAPre-anesthesia Checklist: Patient identified, Emergency Drugs available, Suction available and Patient being monitored Patient Re-evaluated:Patient Re-evaluated prior to induction Oxygen  Delivery Method: Circle system utilized Preoxygenation: Pre-oxygenation with 100% oxygen  Induction Type: IV induction Ventilation: Mask ventilation without difficulty Laryngoscope Size: Mac and 4 Grade View: Grade III Tube type: Oral Tube size: 7.5 mm Number of attempts: 1 Airway Equipment and Method: Stylet and Oral airway Placement Confirmation: ETT inserted through vocal cords under direct vision, positive ETCO2 and breath sounds checked- equal and bilateral Secured at: 22 cm Tube secured with: Tape Dental Injury: Teeth and Oropharynx as per pre-operative assessment   Pt reports LD Eliquis  01/08/24.  Preop labs reviewed, unremarkable.   EKG 07/05/23: Normal sinus rhythm. Rate 80. Low voltage QRS. Nonspecific T wave abnormality  HRCT Chest 06/21/23: IMPRESSION: 1. Unchanged mild pulmonary fibrosis in a pattern with apical to basal gradient, featuring irregular peripheral interstitial opacity scattered ground-glass, and tubular bronchiectasis in the lung bases. No evidence of subpleural bronchiolectasis or honeycombing. Findings remain indeterminate for UIP per consensus guidelines: Diagnosis of Idiopathic Pulmonary Fibrosis: An Official ATS/ERS/JRS/ALAT Clinical Practice Guideline. Am JINNY Honey Crit Care Med Vol 198, Iss 5, [phone], Jan 12 2017. 2. Mild, lobular air trapping on  expiratory phase imaging, suggesting small airways disease, or perhaps alternately a component of interstitial lung disease. 3. Hepatic steatosis.  TTE 01/08/22: 1. Left ventricular ejection fraction, by estimation, is 55 to 60%. The  left ventricle has normal function. The left ventricle has no regional  wall motion abnormalities. Left ventricular diastolic parameters are  consistent with Grade I diastolic  dysfunction (impaired relaxation).  2. Right ventricular systolic function is normal. The right ventricular  size is normal.  3. The mitral valve is normal in structure. No evidence of mitral valve  regurgitation. No evidence of mitral stenosis.  4. The aortic valve is tricuspid. Aortic valve regurgitation is not  visualized. No aortic stenosis is present.  5. The inferior vena cava is normal in size with greater than 50%  respiratory variability, suggesting right atrial pressure of 3 mmHg.   Comparison(s): No significant change from prior study. Prior images  reviewed side by side.   Conclusion(s)/Recommendation(s): Normal biventricular function without  evidence of hemodynamically significant valvular heart disease.   Nuclear stress 03/21/2016:  The left ventricular ejection fraction is normal (55-65%).  Nuclear stress EF: 61%.  The study is normal.  This is a low risk study.  There was no ST segment deviation noted during stress.    )         Anesthesia Quick Evaluation

## 2024-01-14 ENCOUNTER — Ambulatory Visit (HOSPITAL_BASED_OUTPATIENT_CLINIC_OR_DEPARTMENT_OTHER): Payer: Self-pay

## 2024-01-14 ENCOUNTER — Other Ambulatory Visit: Payer: Self-pay

## 2024-01-14 ENCOUNTER — Encounter (HOSPITAL_COMMUNITY)
Admission: RE | Disposition: A | Discharge status: Home / Self Care | Payer: Self-pay | Source: Home / Self Care | Attending: Neurological Surgery

## 2024-01-14 ENCOUNTER — Ambulatory Visit (HOSPITAL_COMMUNITY)

## 2024-01-14 ENCOUNTER — Ambulatory Visit (HOSPITAL_COMMUNITY): Payer: Self-pay | Admitting: Physician Assistant

## 2024-01-14 ENCOUNTER — Observation Stay (HOSPITAL_COMMUNITY)
Admission: RE | Admit: 2024-01-14 | Discharge: 2024-01-15 | Disposition: A | Discharge status: Home / Self Care | Attending: Neurological Surgery | Admitting: Neurological Surgery

## 2024-01-14 DIAGNOSIS — M5414 Radiculopathy, thoracic region: Secondary | ICD-10-CM | POA: Diagnosis not present

## 2024-01-14 DIAGNOSIS — F1090 Alcohol use, unspecified, uncomplicated: Secondary | ICD-10-CM | POA: Insufficient documentation

## 2024-01-14 DIAGNOSIS — Z79899 Other long term (current) drug therapy: Secondary | ICD-10-CM | POA: Insufficient documentation

## 2024-01-14 DIAGNOSIS — E119 Type 2 diabetes mellitus without complications: Secondary | ICD-10-CM | POA: Diagnosis not present

## 2024-01-14 DIAGNOSIS — G4733 Obstructive sleep apnea (adult) (pediatric): Secondary | ICD-10-CM

## 2024-01-14 DIAGNOSIS — I10 Essential (primary) hypertension: Secondary | ICD-10-CM | POA: Diagnosis not present

## 2024-01-14 DIAGNOSIS — Z9682 Presence of neurostimulator: Secondary | ICD-10-CM | POA: Diagnosis not present

## 2024-01-14 DIAGNOSIS — K219 Gastro-esophageal reflux disease without esophagitis: Secondary | ICD-10-CM | POA: Insufficient documentation

## 2024-01-14 DIAGNOSIS — M549 Dorsalgia, unspecified: Secondary | ICD-10-CM

## 2024-01-14 DIAGNOSIS — M5459 Other low back pain: Secondary | ICD-10-CM | POA: Diagnosis not present

## 2024-01-14 DIAGNOSIS — G8929 Other chronic pain: Secondary | ICD-10-CM | POA: Diagnosis not present

## 2024-01-14 DIAGNOSIS — I480 Paroxysmal atrial fibrillation: Secondary | ICD-10-CM | POA: Diagnosis not present

## 2024-01-14 DIAGNOSIS — E785 Hyperlipidemia, unspecified: Secondary | ICD-10-CM | POA: Diagnosis not present

## 2024-01-14 HISTORY — PX: SPINAL CORD STIMULATOR INSERTION: SHX5378

## 2024-01-14 SURGERY — INSERTION, SPINAL CORD STIMULATOR, LUMBAR
Anesthesia: General

## 2024-01-14 MED ORDER — CHLORHEXIDINE GLUCONATE CLOTH 2 % EX PADS: 6.0000 | MEDICATED_PAD | Freq: Once | CUTANEOUS | Status: DC

## 2024-01-14 MED ORDER — CHLORHEXIDINE GLUCONATE 0.12 % MT SOLN
OROMUCOSAL | Status: AC
  Filled 2024-01-14: qty 15

## 2024-01-14 MED ORDER — SENNA 8.6 MG PO TABS
1.0000 | ORAL_TABLET | Freq: Two times a day (BID) | ORAL | Status: DC
  Administered 2024-01-14 – 2024-01-15 (×2): 8.6 mg via ORAL
  Filled 2024-01-14 (×2): qty 1

## 2024-01-14 MED ORDER — ORAL CARE MOUTH RINSE: 15.0000 mL | Freq: Once | OROMUCOSAL | Status: AC

## 2024-01-14 MED ORDER — FENTANYL CITRATE (PF) 100 MCG/2ML IJ SOLN
25.0000 ug | INTRAMUSCULAR | Status: DC | PRN
  Administered 2024-01-14 (×2): 25 ug via INTRAVENOUS
  Administered 2024-01-14: 50 ug via INTRAVENOUS

## 2024-01-14 MED ORDER — FENTANYL CITRATE (PF) 100 MCG/2ML IJ SOLN
INTRAMUSCULAR | Status: AC
  Filled 2024-01-14: qty 2

## 2024-01-14 MED ORDER — DEXAMETHASONE SODIUM PHOSPHATE 10 MG/ML IJ SOLN
10.0000 mg | Freq: Once | INTRAMUSCULAR | Status: AC
  Administered 2024-01-14: 10 mg via INTRAVENOUS
  Filled 2024-01-14: qty 1

## 2024-01-14 MED ORDER — POLYETHYLENE GLYCOL 3350 17 G PO PACK: 17.0000 g | PACK | Freq: Every day | ORAL | Status: DC | PRN

## 2024-01-14 MED ORDER — HEMOSTATIC AGENTS (NO CHARGE) OPTIME
TOPICAL | Status: DC | PRN
  Administered 2024-01-14: 1 via TOPICAL

## 2024-01-14 MED ORDER — ACETAMINOPHEN 650 MG RE SUPP: 650.0000 mg | RECTAL | Status: DC | PRN

## 2024-01-14 MED ORDER — FENTANYL CITRATE (PF) 250 MCG/5ML IJ SOLN
INTRAMUSCULAR | Status: AC
  Filled 2024-01-14: qty 5

## 2024-01-14 MED ORDER — ACETAMINOPHEN 500 MG PO TABS
1000.0000 mg | ORAL_TABLET | Freq: Every morning | ORAL | Status: DC
  Administered 2024-01-15: 500 mg via ORAL
  Filled 2024-01-14 (×2): qty 2

## 2024-01-14 MED ORDER — MENTHOL 3 MG MT LOZG: 1.0000 | LOZENGE | OROMUCOSAL | Status: DC | PRN

## 2024-01-14 MED ORDER — CEFAZOLIN SODIUM-DEXTROSE 2-4 GM/100ML-% IV SOLN
INTRAVENOUS | Status: AC
  Filled 2024-01-14: qty 100

## 2024-01-14 MED ORDER — ALUM & MAG HYDROXIDE-SIMETH 200-200-20 MG/5ML PO SUSP: 30.0000 mL | Freq: Four times a day (QID) | ORAL | Status: DC | PRN

## 2024-01-14 MED ORDER — CEFAZOLIN SODIUM-DEXTROSE 2-4 GM/100ML-% IV SOLN
2.0000 g | Freq: Three times a day (TID) | INTRAVENOUS | Status: AC
  Administered 2024-01-14 (×2): 2 g via INTRAVENOUS
  Filled 2024-01-14 (×2): qty 100

## 2024-01-14 MED ORDER — SODIUM CHLORIDE 0.9% FLUSH
3.0000 mL | Freq: Two times a day (BID) | INTRAVENOUS | Status: DC
  Administered 2024-01-14: 3 mL via INTRAVENOUS

## 2024-01-14 MED ORDER — FENTANYL CITRATE (PF) 250 MCG/5ML IJ SOLN
INTRAMUSCULAR | Status: DC | PRN
  Administered 2024-01-14: 100 ug via INTRAVENOUS
  Administered 2024-01-14: 50 ug via INTRAVENOUS

## 2024-01-14 MED ORDER — LORATADINE 10 MG PO TABS: 10.0000 mg | ORAL_TABLET | Freq: Every day | ORAL | Status: DC | PRN

## 2024-01-14 MED ORDER — ACETAMINOPHEN 10 MG/ML IV SOLN
INTRAVENOUS | Status: AC
  Filled 2024-01-14: qty 100

## 2024-01-14 MED ORDER — ACETAMINOPHEN 10 MG/ML IV SOLN
1000.0000 mg | Freq: Once | INTRAVENOUS | Status: DC | PRN
Start: 2024-01-14 — End: 2024-01-14
  Administered 2024-01-14: 1000 mg via INTRAVENOUS

## 2024-01-14 MED ORDER — PROPOFOL 500 MG/50ML IV EMUL
INTRAVENOUS | Status: DC | PRN
  Administered 2024-01-14: 50 ug/kg/min via INTRAVENOUS

## 2024-01-14 MED ORDER — THROMBIN 5000 UNITS EX SOLR
OROMUCOSAL | Status: DC | PRN
  Administered 2024-01-14: 5 mL via TOPICAL

## 2024-01-14 MED ORDER — ROCURONIUM BROMIDE 10 MG/ML (PF) SYRINGE
PREFILLED_SYRINGE | INTRAVENOUS | Status: DC | PRN
  Administered 2024-01-14: 50 mg via INTRAVENOUS

## 2024-01-14 MED ORDER — HYDROXYZINE HCL 50 MG/ML IM SOLN
50.0000 mg | Freq: Four times a day (QID) | INTRAMUSCULAR | Status: DC | PRN
  Administered 2024-01-14: 50 mg via INTRAMUSCULAR
  Filled 2024-01-14: qty 1

## 2024-01-14 MED ORDER — FLEET ENEMA RE ENEM: 1.0000 | ENEMA | Freq: Once | RECTAL | Status: DC | PRN

## 2024-01-14 MED ORDER — METHOCARBAMOL 500 MG PO TABS: 500.0000 mg | ORAL_TABLET | Freq: Four times a day (QID) | ORAL | Status: DC | PRN

## 2024-01-14 MED ORDER — OXYCODONE HCL 5 MG PO TABS: 5.0000 mg | ORAL_TABLET | Freq: Once | ORAL | Status: DC | PRN

## 2024-01-14 MED ORDER — HYDROMORPHONE HCL 1 MG/ML IJ SOLN
0.5000 mg | INTRAMUSCULAR | Status: DC | PRN
  Administered 2024-01-14 (×2): 0.5 mg via INTRAVENOUS
  Filled 2024-01-14 (×2): qty 0.5

## 2024-01-14 MED ORDER — ACETAMINOPHEN 325 MG PO TABS
650.0000 mg | ORAL_TABLET | ORAL | Status: DC | PRN
Start: 2024-01-14 — End: 2024-01-15

## 2024-01-14 MED ORDER — CHLORHEXIDINE GLUCONATE 0.12 % MT SOLN
15.0000 mL | Freq: Once | OROMUCOSAL | Status: AC
  Administered 2024-01-14: 15 mL via OROMUCOSAL

## 2024-01-14 MED ORDER — SODIUM CHLORIDE 0.9 % IV SOLN: 250.0000 mL | INTRAVENOUS | Status: DC

## 2024-01-14 MED ORDER — THROMBIN 5000 UNITS EX KIT
PACK | CUTANEOUS | Status: AC
Start: 2024-01-14 — End: 2024-01-14
  Filled 2024-01-14: qty 1

## 2024-01-14 MED ORDER — METHOCARBAMOL 1000 MG/10ML IJ SOLN: 500.0000 mg | Freq: Four times a day (QID) | INTRAMUSCULAR | Status: DC | PRN

## 2024-01-14 MED ORDER — PHENYLEPHRINE HCL-NACL 20-0.9 MG/250ML-% IV SOLN
INTRAVENOUS | Status: DC | PRN
  Administered 2024-01-14: 20 ug/min via INTRAVENOUS

## 2024-01-14 MED ORDER — PHENOL 1.4 % MT LIQD: 1.0000 | OROMUCOSAL | Status: DC | PRN

## 2024-01-14 MED ORDER — PROPOFOL 10 MG/ML IV BOLUS
INTRAVENOUS | Status: DC | PRN
  Administered 2024-01-14: 30 mg via INTRAVENOUS
  Administered 2024-01-14: 120 mg via INTRAVENOUS

## 2024-01-14 MED ORDER — BISACODYL 10 MG RE SUPP: 10.0000 mg | Freq: Every day | RECTAL | Status: DC | PRN

## 2024-01-14 MED ORDER — ONDANSETRON HCL 4 MG/2ML IJ SOLN
INTRAMUSCULAR | Status: DC | PRN
  Administered 2024-01-14: 4 mg via INTRAVENOUS

## 2024-01-14 MED ORDER — SIMETHICONE 80 MG PO CHEW
80.0000 mg | CHEWABLE_TABLET | Freq: Four times a day (QID) | ORAL | Status: DC | PRN
  Administered 2024-01-14: 80 mg via ORAL
  Filled 2024-01-14 (×2): qty 1

## 2024-01-14 MED ORDER — LACTATED RINGERS IV SOLN: INTRAVENOUS | Status: DC

## 2024-01-14 MED ORDER — SUGAMMADEX SODIUM 200 MG/2ML IV SOLN
INTRAVENOUS | Status: DC | PRN
  Administered 2024-01-14: 163.2 mg via INTRAVENOUS

## 2024-01-14 MED ORDER — SODIUM CHLORIDE 0.9% FLUSH: 3.0000 mL | INTRAVENOUS | Status: DC | PRN

## 2024-01-14 MED ORDER — CEFAZOLIN SODIUM-DEXTROSE 2-4 GM/100ML-% IV SOLN
2.0000 g | INTRAVENOUS | Status: AC
  Administered 2024-01-14: 2 g via INTRAVENOUS

## 2024-01-14 MED ORDER — BUPIVACAINE HCL (PF) 0.5 % IJ SOLN
INTRAMUSCULAR | Status: DC | PRN
  Administered 2024-01-14: 10 mL
  Administered 2024-01-14: 20 mL

## 2024-01-14 MED ORDER — DOCUSATE SODIUM 100 MG PO CAPS
100.0000 mg | ORAL_CAPSULE | Freq: Two times a day (BID) | ORAL | Status: DC
Start: 2024-01-14 — End: 2024-01-15
  Administered 2024-01-14 – 2024-01-15 (×2): 100 mg via ORAL
  Filled 2024-01-14 (×2): qty 1

## 2024-01-14 MED ORDER — EPHEDRINE SULFATE (PRESSORS) 50 MG/ML IJ SOLN
INTRAMUSCULAR | Status: DC | PRN
  Administered 2024-01-14: 10 mg via INTRAVENOUS

## 2024-01-14 MED ORDER — BACLOFEN 10 MG PO TABS
10.0000 mg | ORAL_TABLET | Freq: Four times a day (QID) | ORAL | Status: DC
  Administered 2024-01-14 – 2024-01-15 (×4): 10 mg via ORAL
  Filled 2024-01-14 (×4): qty 1

## 2024-01-14 MED ORDER — OXYCODONE HCL 5 MG/5ML PO SOLN: 5.0000 mg | Freq: Once | ORAL | Status: DC | PRN

## 2024-01-14 MED ORDER — BUPIVACAINE HCL (PF) 0.5 % IJ SOLN
INTRAMUSCULAR | Status: AC
  Filled 2024-01-14: qty 30

## 2024-01-14 MED ORDER — OXYCODONE-ACETAMINOPHEN 5-325 MG PO TABS
1.0000 | ORAL_TABLET | Freq: Four times a day (QID) | ORAL | Status: DC | PRN
  Administered 2024-01-14 – 2024-01-15 (×3): 1 via ORAL
  Filled 2024-01-14 (×3): qty 1

## 2024-01-14 MED ORDER — PANTOPRAZOLE SODIUM 40 MG PO TBEC
40.0000 mg | DELAYED_RELEASE_TABLET | Freq: Every day | ORAL | Status: DC
  Administered 2024-01-14: 40 mg via ORAL
  Filled 2024-01-14: qty 1

## 2024-01-14 MED ORDER — DEXAMETHASONE SODIUM PHOSPHATE 10 MG/ML IJ SOLN
INTRAMUSCULAR | Status: DC | PRN
  Administered 2024-01-14: 10 mg via INTRAVENOUS

## 2024-01-14 SURGICAL SUPPLY — 48 items
BAG COUNTER SPONGE SURGICOUNT (BAG) ×1 IMPLANT
BENZOIN TINCTURE PRP APPL 2/3 (GAUZE/BANDAGES/DRESSINGS) IMPLANT
BIT DRILL NEURO 2X3.1 SFT TUCH (MISCELLANEOUS) ×1 IMPLANT
BLADE CLIPPER SURG (BLADE) IMPLANT
CABLE BIPOLOR RESECTION CORD (MISCELLANEOUS) ×1 IMPLANT
CONTROL REMOTE FREELINK ALPHA (NEUROSURGERY SUPPLIES) IMPLANT
DERMABOND ADVANCED .7 DNX12 (GAUZE/BANDAGES/DRESSINGS) IMPLANT
DRAPE C-ARM 42X72 X-RAY (DRAPES) ×2 IMPLANT
DRAPE LAPAROTOMY 100X72X124 (DRAPES) ×1 IMPLANT
DRSG OPSITE POSTOP 4X6 (GAUZE/BANDAGES/DRESSINGS) ×1 IMPLANT
DURAPREP 26ML APPLICATOR (WOUND CARE) ×1 IMPLANT
ELECTRODE REM PT RTRN 9FT ADLT (ELECTROSURGICAL) ×1 IMPLANT
GLOVE BIOGEL PI IND STRL 6.5 (GLOVE) IMPLANT
GLOVE BIOGEL PI IND STRL 8.5 (GLOVE) ×1 IMPLANT
GLOVE ECLIPSE 8.5 STRL (GLOVE) ×1 IMPLANT
GLOVE EXAM NITRILE XL STR (GLOVE) IMPLANT
GLOVE SURG SS PI 6.5 STRL IVOR (GLOVE) IMPLANT
GOWN STRL REUS W/ TWL LRG LVL3 (GOWN DISPOSABLE) IMPLANT
GOWN STRL REUS W/ TWL XL LVL3 (GOWN DISPOSABLE) ×1 IMPLANT
GOWN STRL REUS W/TWL 2XL LVL3 (GOWN DISPOSABLE) ×1 IMPLANT
HEMOSTAT POWDER KIT SURGIFOAM (HEMOSTASIS) ×1 IMPLANT
KIT BASIN OR (CUSTOM PROCEDURE TRAY) ×1 IMPLANT
KIT CHARGING PRECISION NEURO (KITS) IMPLANT
KIT IPG ALPHA WAVEWRITER (Stimulator) IMPLANT
KIT TURNOVER KIT B (KITS) ×1 IMPLANT
LEAD COVER EDGE 50CM STIM KIT (Lead) IMPLANT
NDL HYPO 22X1.5 SAFETY MO (MISCELLANEOUS) ×1 IMPLANT
NEEDLE HYPO 22X1.5 SAFETY MO (MISCELLANEOUS) ×1 IMPLANT
NS IRRIG 1000ML POUR BTL (IV SOLUTION) ×1 IMPLANT
PACK LAMINECTOMY NEURO (CUSTOM PROCEDURE TRAY) ×1 IMPLANT
PAD ARMBOARD POSITIONER FOAM (MISCELLANEOUS) ×1 IMPLANT
PASSER ELEVATOR (SPINAL CORD STIMULATOR) IMPLANT
SPIKE FLUID TRANSFER (MISCELLANEOUS) ×1 IMPLANT
SPONGE SURGIFOAM ABS GEL SZ50 (HEMOSTASIS) IMPLANT
STAPLER SKIN PROX 35W (STAPLE) IMPLANT
STRIP CLOSURE SKIN 1/2X4 (GAUZE/BANDAGES/DRESSINGS) IMPLANT
SUT ETHILON 3 0 PS 1 (SUTURE) IMPLANT
SUT SILK 0 TIES 10X30 (SUTURE) IMPLANT
SUT SILK 2 0 PERMA HAND 18 BK (SUTURE) ×2 IMPLANT
SUT SILK 2 0 TIES 10X30 (SUTURE) IMPLANT
SUT VIC AB 0 CT1 18XCR BRD8 (SUTURE) IMPLANT
SUT VIC AB 2-0 CP2 18 (SUTURE) ×1 IMPLANT
SUT VIC AB 3-0 SH 8-18 (SUTURE) ×1 IMPLANT
SUT VIC AB 4-0 RB1 18 (SUTURE) ×1 IMPLANT
TOOL LONG TUNNEL (SPINAL CORD STIMULATOR) IMPLANT
TOWEL GREEN STERILE (TOWEL DISPOSABLE) ×1 IMPLANT
TOWEL GREEN STERILE FF (TOWEL DISPOSABLE) ×1 IMPLANT
WATER STERILE IRR 1000ML POUR (IV SOLUTION) ×1 IMPLANT

## 2024-01-14 NOTE — H&P (Signed)
 [name] is an 78 y.o. male.   Chief Complaint: Chronic back pain. HPI: This is a male a 78 year old individuals had significant spondylitic disease in the past.  She had a fusion from L2 to the sacrum and then developed a severe L1 compression fracture with retropulsed bone.  He required reconstruction stabilization from T10-L5.  He has had issues of chronic back pain since that time he had positive response to a temporary spinal cord stimulator that was placed by Dr. Darlis.  He is now undergoing implantation of a permanent paddle electrode with a spinal cord stimulator generator.  Past Medical History:  Diagnosis Date   Allergy to alpha-gal    2015; has been able to re-introduce red meat for the past 2 1/2 years without reaction (as of 01/21/19)   Arthritis    Chest pain    a. 03/2017: echo showing EF of 60-65%, no regional WMA or significant valve abnormalities. b. 03/2016: NST with no evidence of ischemia.    Complication of anesthesia    anaphylactic reaction to Xylocaine  > 10 years ago; reported later recieved marcaine  without issue   Difficult intubation 02/19/2018   No difficulty 01/26/19 with Mac and 3 or 11/10/19 with Glidescope   Digestive problems    on Prednisone  prn for this issue- diarrhea   Dyspnea    pulmonary fibrosis   Dysrhythmia    irregular due to Myocarditis- takes Atenolol , Norvasc    GERD (gastroesophageal reflux disease)    H/O viral myocarditis    25 years ago   Head injury, closed, with concussion    Breif LOC   Hyperlipidemia    Hypertension    Inguinal hernia    Mild mitral valve prolapse    per Dr Joesphine notes   PAF (paroxysmal atrial fibrillation) (HCC)    a. initially occuring in 02/2016. b. recurrent in 07/2016. Placed on Eliquis    Polymyalgia rheumatica (HCC)    PONV (postoperative nausea and vomiting)    Pre-diabetes    elevated due to high dose of prednsione for back no longer elevated   Pulmonary fibrosis (HCC)    Sleep apnea     no cpap mild    Past Surgical History:  Procedure Laterality Date   ANTERIOR CERVICAL DECOMP/DISCECTOMY FUSION N/A 11/10/2019   Procedure: Cervical Three-Four Anterior cervical decompression/discectomy/fusion;  Surgeon: Colon Shove, MD;  Location[facility] OR;  Service: Neurosurgery;  Laterality: N/A;  Cervical Three-Four Anterior cervical decompression/discectomy/fusion   apendectomy  1965   APPENDECTOMY     APPLICATION OF ROBOTIC ASSISTANCE FOR SPINAL PROCEDURE N/A 12/22/2020   Procedure: APPLICATION OF ROBOTIC ASSISTANCE FOR SPINAL PROCEDURE;  Surgeon: Colon Shove, MD;  Location: [facility] OR;  Service: Neurosurgery;  Laterality: N/A;   BACK SURGERY     BIOPSY  04/18/2023   Procedure: BIOPSY;  Surgeon: Cinderella Deatrice FALCON, MD;  Location: [facility] SUITE;  Service: Endoscopy;;   bone spur Bilateral 1999   feet   CARDIAC CATHETERIZATION  2001   CHOLECYSTECTOMY  2004   COLON RESECTION N/A 06/15/2018   Procedure: SIGMOID COLON RESECTION;  Surgeon: Curvin Deward MOULD, MD;  Location: [facility] ORS;  Service: General;  Laterality: N/A;   COLONOSCOPY     COLOSTOMY N/A 06/15/2018   Procedure: COLOSTOMY;  Surgeon: Curvin Deward MOULD, MD;  Location: [facility] ORS;  Service: General;  Laterality: N/A;   COLOSTOMY TAKEDOWN N/A 01/26/2019   Procedure: LAPAROSCOPIC ASSISTED COLOSTOMY TAKEDOWN;  Surgeon: Curvin Deward MOULD, MD;  Location: [facility] OR;  Service: General;  Laterality: N/A;   DG GREAT TOE LEFT FOOT Bilateral    bone spurs   ESOPHAGOGASTRODUODENOSCOPY (EGD) WITH PROPOFOL  N/A 04/18/2023   Procedure: ESOPHAGOGASTRODUODENOSCOPY (EGD) WITH PROPOFOL ;  Surgeon: Cinderella Deatrice FALCON, MD;  Location: [facility];  Service: Endoscopy;  Laterality: N/A;  2:05PM;ASA 1-2   EYE SURGERY Bilateral    cataract removal   LAPAROSCOPIC LYSIS OF ADHESIONS N/A 01/26/2019   Procedure: LAPAROSCOPIC LYSIS OF ADHESIONS;  Surgeon: Curvin Deward MOULD, MD;  Location: [facility] OR;  Service: General;  Laterality: N/A;   LAPAROTOMY N/A 06/15/2018   Procedure:  EXPLORATORY LAPAROTOMY;  Surgeon: Curvin Deward MOULD, MD;  Location: [facility] ORS;  Service: General;  Laterality: N/A;   LUMBAR LAMINECTOMY/ DECOMPRESSION WITH MET-RX Right 05/05/2013   Procedure: Right Lumbar three-four Extraforaminal Microdiskectomy with Metrex;  Surgeon: Victory Gens, MD;  Location: [facility] OR[facility];  Service: Neurosurgery;  Laterality: Right;  Right Lumbar three-four Extraforaminal Microdiskectomy with Metrex   LUNG BIOPSY Right 02/19/2018   Procedure: LUNG BIOPSY;  Surgeon: Kerrin Elspeth BROCKS, MD;  Location: [facility] OR;  Service: Thoracic;  Laterality: Right;   POSTERIOR LUMBAR FUSION 4 LEVEL N/A 12/22/2020   Procedure: Lumbar one-two Posterior lumbar interbody fusion with stabilization from Thoracic ten to Lumbar one with robotic screw placement;  Surgeon: Gens Victory, MD;  Location: [facility] OR;  Service: Neurosurgery;  Laterality: N/A;  RM 20   REVERSE SHOULDER ARTHROPLASTY Right 07/11/2022   Procedure: REVERSE SHOULDER ARTHROPLASTY;  Surgeon: Cristy Bonner DASEN, MD;  Location: [facility] ORS;  Service: Orthopedics;  Laterality: Right;   SHOULDER OPEN ROTATOR CUFF REPAIR Bilateral 2001   TONSILLECTOMY     VIDEO ASSISTED THORACOSCOPY Right 02/19/2018   Procedure: VIDEO ASSISTED THORACOSCOPY;  Surgeon: Kerrin Elspeth BROCKS, MD;  Location: [facility] OR;  Service: Thoracic;  Laterality: Right;    Family History  Problem Relation Age of Onset   Rheum arthritis Mother    Thyroid  cancer Mother    Heart disease Father    Asthma Father    Thyroid  cancer Sister    Melanoma Brother    Social History:  reports that he has never smoked. He has never used smokeless tobacco. He reports current alcohol use of about 21.0 standard drinks of alcohol per week. He reports that he does not use drugs.  Allergies:  Allergies  Allergen Reactions   Xylocaine  [Lidocaine ] Anaphylaxis    Injection form   Codeine Nausea And Vomiting   Statins Other (See Comments)    Muscle soreness and an aching feeling all over Myalgias    Aldactone  [Spironolactone ]     Sore nipples and chest   Alpha-Gal     2015; has been able to re-introduce red meat for the past 2 1/2 years without reaction (as of 01/21/19)   Pirfenidone  Other (See Comments)    GI side effects   Percocet [Oxycodone -Acetaminophen ] Itching    Medications Prior to Admission  Medication Sig Dispense Refill   acetaminophen  (ACETAMINOPHEN  8 HOUR) 650 MG CR tablet Take 1,300 mg by mouth in the morning.     Cholecalciferol (DIALYVITE VITAMIN D  5000) 125 MCG (5000 UT) capsule Take 5,000 Units by mouth in the morning.     diltiazem  (CARDIZEM  CD) 120 MG 24 hr capsule TAKE ONE CAPSULE BY MOUTH DAILY 90 capsule 3   ELIQUIS  5 MG TABS tablet TAKE ONE TABLET (5MG  TOTAL) BY MOUTH TWODAILY (Patient taking differently: Take 5 mg by mouth 2 (two) times daily.) 180 tablet 1   ezetimibe  (ZETIA ) 10 MG tablet TAKE  ONE (1) TABLET BY MOUTH EVERY DAY 90 tablet 3   hydroxychloroquine  (PLAQUENIL ) 200 MG tablet Take 200 mg by mouth 2 (two) times daily.     metoprolol  tartrate (LOPRESSOR ) 25 MG tablet TAKE ONE TABLET BY MOUTH TWICE A DAY 180 tablet 3   omeprazole  (PRILOSEC) 40 MG capsule TAKE ONE CAPSULE (40 MG TOTAL) BY MOUTH DAILY. (Patient taking differently: Take 40 mg by mouth daily as needed (indigestion/heartburn.).) 60 capsule 1   predniSONE  (DELTASONE ) 5 MG tablet Take 5 mg by mouth in the morning.     rosuvastatin  (CRESTOR ) 10 MG tablet TAKE ONE-HALF TABLET (5MG  TOTAL) BY MOUTH ONCE A WEEK (Patient taking differently: Take 5 mg by mouth every Monday at 6 PM.) 10 tablet 2   tamsulosin  (FLOMAX ) 0.4 MG CAPS capsule Take 0.4 mg by mouth 2 (two) times daily.      cetirizine (ZYRTEC) 10 MG tablet Take 10 mg by mouth daily as needed for allergies.      diphenoxylate -atropine  (LOMOTIL ) 2.5-0.025 MG tablet Take 1 tablet by mouth 2 (two) times daily as needed for diarrhea or loose stools.     fluticasone  (FLONASE ) 50 MCG/ACT nasal spray Place 2 sprays into both nostrils daily as needed for  allergies or rhinitis.     ondansetron  (ZOFRAN ) 8 MG tablet Take 8 mg by mouth every 8 (eight) hours as needed for nausea or vomiting.      No results found for this or any previous visit (from the past 48 hours). No results found.  Review of Systems  Constitutional:  Positive for activity change.  Musculoskeletal:  Positive for back pain and gait problem.  All other systems reviewed and are negative.   Blood pressure 124/76, pulse 67, temperature 97.6 F (36.4 C), temperature source Oral, resp. rate 18, height 5' 8 (1.727 m), weight 81.6 kg, SpO2 95%. Physical Exam Constitutional:      Appearance: Normal appearance. He is normal weight.  HENT:     Head: Normocephalic and atraumatic.     Right Ear: Tympanic membrane, ear canal and external ear normal.     Left Ear: Tympanic membrane, ear canal and external ear normal.     Nose: Nose normal.     Mouth/Throat:     Mouth: Mucous membranes are moist.     Pharynx: Oropharynx is clear.  Eyes:     Extraocular Movements: Extraocular movements intact.     Conjunctiva/sclera: Conjunctivae normal.     Pupils: Pupils are equal, round, and reactive to light.  Cardiovascular:     Rate and Rhythm: Normal rate and regular rhythm.     Pulses: Normal pulses.     Heart sounds: Normal heart sounds.  Pulmonary:     Effort: Pulmonary effort is normal.     Breath sounds: Normal breath sounds.  Abdominal:     General: Abdomen is flat. Bowel sounds are normal.     Palpations: Abdomen is soft.  Musculoskeletal:        General: Normal range of motion.     Cervical back: Normal range of motion and neck supple.  Skin:    General: Skin is warm and dry.     Capillary Refill: Capillary refill takes less than 2 seconds.  Neurological:     General: No focal deficit present.     Mental Status: He is alert.  Psychiatric:        Mood and Affect: Mood normal.        Behavior: Behavior normal.  Thought Content: Thought content normal.         Judgment: Judgment normal.      Assessment/Plan Chronic unremitting back pain with positive response to temporary spinal cord stimulator.  Plan implantation of permanent spinal cord stimulator with paddle electrode.  Victory JINNY Gens, MD 01/14/2024, 7:57 AM

## 2024-01-14 NOTE — Anesthesia Postprocedure Evaluation (Signed)
 Anesthesia Post Note  Patient: Adonnis Siler Malanga  Procedure(s) Performed: INSERTION, SPINAL CORD STIMULATOR PERMANENT WITH PADDLE LEAD PLACEMENT, LUMBAR     Patient location during evaluation: PACU Anesthesia Type: General Level of consciousness: awake and alert Pain management: pain level controlled Vital Signs Assessment: post-procedure vital signs reviewed and stable Respiratory status: spontaneous breathing, nonlabored ventilation, respiratory function stable and patient connected to nasal cannula oxygen  Cardiovascular status: blood pressure returned to baseline and stable Postop Assessment: no apparent nausea or vomiting Anesthetic complications: no   No notable events documented.  Last Vitals:  Vitals:   01/14/24 1130 01/14/24 1138  BP: 102/76   Pulse: 72 73  Resp: 20 16  Temp:  36.4 C  SpO2: 91% 92%    Last Pain:  Vitals:   01/14/24 1108  TempSrc:   PainSc: 10-Worst pain ever    LLE Motor Response: Purposeful movement (01/14/24 1138) LLE Sensation: Full sensation (01/14/24 1138) RLE Motor Response: Purposeful movement (01/14/24 1138) RLE Sensation: Full sensation (01/14/24 1138)      Thom JONELLE Peoples

## 2024-01-14 NOTE — Op Note (Signed)
 Date of surgery: 01/14/2024 Preoperative diagnosis: Chronic intractable back pain. 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  He has had chronic back pain since that time and has been evaluated with a percutaneous spinal cord stimulator which seemed to provide substantial relief of greater than 80% of his symptoms.  Procedure: The patient was brought to the operating room supine on the stretcher.  After the smooth induction of general tracheal anesthesia the patient was carefully turned prone and the bony prominences were appropriately padded and protected.  The back was prepped with alcohol DuraPrep and fluoroscopic guidance was used to localize areas of T8-T9 where was planned to place a paddle electrode.  The skin was marked for an appropriately placed incision over the T8-T9 interspace.  An appropriate placed pouch was made for the left flank incision was planned over this region off the left side.  After cleansing with alcohol and prepping with DuraPrep the patient was draped out sterilely 20 cc of half percent Marcaine  was used in the subcutaneous tissues and a vertical incision was made in the thoracic spine.  On the left side Hemi laminectomy was planned for the T8-T9 region after dissecting a subperiosteal fashion the paraspinous musculature.  Once the laminotomy was completed the epidural space was explored and the paddle trial was placed into this region.  Then using fluoroscopic guidance I placed the paddle electrode over the T8 vertebrae and the T7 vertebrae extending to the T6-T7 junction.  A second incision  was made in the left flank and the subcutaneous tunnel was passed between these 2 regions using a straw.  The electrodes were then passed through the straw and a subcutaneous pouch was formed in the flank incision.  The electrodes were then sequentially at attached to the spinal cord stimulator generator.  Impedances were checked and were noted to be intact.  The battery was then placed into the subcutaneous pouch with the electrodes carefully tucked underneath.  The electrodes were secured at the laminotomy site using a singular 2-0 silk suture around one of the electrode wires.  The radiograph was again obtained to make sure that the placement had not changed.  When this was verified the fascia was then closed with 2-0 Vicryl interrupted fashion 2-0 Vicryl was used in the subcutaneous and subcuticular tissues 3-0 and 4-0 Vicryl was used for the final subcuticular closure in both incisions.  Dermabond was placed on the skin and blood loss was estimated less than 20 cc.

## 2024-01-14 NOTE — Transfer of Care (Signed)
 Immediate Anesthesia Transfer of Care Note  Patient: [name]  Procedure(s) Performed: INSERTION, SPINAL CORD STIMULATOR PERMANENT WITH PADDLE LEAD PLACEMENT, LUMBAR  Patient Location: PACU  Anesthesia Type:General  Level of Consciousness: awake, alert , and oriented  Airway & Oxygen  Therapy: Patient Spontanous Breathing  Post-op Assessment: Report given to RN and Post -op Vital signs reviewed and stable  Post vital signs: Reviewed and stable  Last Vitals:  Vitals Value Taken Time  BP 111/66 01/14/24 10:15  Temp 98   Pulse 76 01/14/24 10:18  Resp 17 01/14/24 10:18  SpO2 91 % 01/14/24 10:18  Vitals shown include unfiled device data.  Last Pain:  Vitals:   01/14/24 0711  TempSrc:   PainSc: 0-No pain         Complications: No notable events documented.

## 2024-01-14 NOTE — Anesthesia Procedure Notes (Signed)
 Procedure Name: Intubation Date/Time: 01/14/2024 8:20 AM  Performed by: Zelphia Norleen HERO, CRNAPre-anesthesia Checklist: Patient identified, Emergency Drugs available, Suction available and Patient being monitored Patient Re-evaluated:Patient Re-evaluated prior to induction Oxygen  Delivery Method: Circle system utilized Preoxygenation: Pre-oxygenation with 100% oxygen  Induction Type: IV induction Ventilation: Mask ventilation without difficulty Laryngoscope Size: Mac and 3 Grade View: Grade II Tube type: Oral Tube size: 7.0 mm Number of attempts: 1 Airway Equipment and Method: Stylet and Oral airway Placement Confirmation: ETT inserted through vocal cords under direct vision, positive ETCO2 and breath sounds checked- equal and bilateral Secured at: 23 cm Tube secured with: Tape Dental Injury: Teeth and Oropharynx as per pre-operative assessment

## 2024-01-15 ENCOUNTER — Encounter (HOSPITAL_COMMUNITY): Payer: Self-pay | Admitting: Neurological Surgery

## 2024-01-15 DIAGNOSIS — M5459 Other low back pain: Secondary | ICD-10-CM | POA: Diagnosis not present

## 2024-01-15 MED ORDER — HYDROCODONE-ACETAMINOPHEN 5-325 MG PO TABS
1.0000 | ORAL_TABLET | ORAL | Status: DC | PRN
  Administered 2024-01-15: 1 via ORAL
  Filled 2024-01-15: qty 1

## 2024-01-15 MED ORDER — HYDROCODONE-ACETAMINOPHEN 5-325 MG PO TABS: 1.0000 | ORAL_TABLET | ORAL | 0 refills | Status: AC | PRN

## 2024-01-15 MED ORDER — DEXAMETHASONE 1 MG PO TABS: ORAL_TABLET | ORAL | 0 refills | Status: AC

## 2024-01-15 NOTE — Evaluation (Signed)
 Physical Therapy Evaluation Patient Details Name: [name] MRN: [id number] DOB: 11-19-1945 Today's Date: 01/15/2024  History of Present Illness  Pt is a 78 y.o. male who presented 01/14/24 for placement of a spinal cord stimulator. PMH: arthritis, dysrhythmia, GERD, hx of viral myocarditis, HLD, HTN, mild mitral valve prolapse, PAF, polymalgia rheumatica, pre-diabetes, pulmonary fibrosis, sleep apnea   Clinical Impression  Pt presents with condition above and deficits mentioned below, see PT Problem List. PTA, he was mod I for functional mobility, intermittently holding onto furniture for support. Pt lives with his wife in a 1-level house with 1 STE. Currently, the pt displays some mild deficits in balance and activity tolerance, limited by back pain and feeling swimmy headed from the meds per pt. He is currently preferring to utilize a RW for functional mobility to ensure his safety at this time, but is capable of ambulating without UE support or LOB. He will likely progress well as his pain improves and as he feels less swimmy headed. Will continue to follow acutely to maximize his return to baseline prior to d/c but likely will not need post acute PT. Educated pt on changing positions and ambulating frequently to tolerance and to use his RW at d/c as needed for safety and pain management until able to safely progress away from needing it. Pt verbalized understanding.         If plan is discharge home, recommend the following: Assistance with cooking/housework;Assist for transportation;Help with stairs or ramp for entrance   Can travel by private vehicle        Equipment Recommendations Rolling walker (2 wheels)  Recommendations for Other Services       Functional Status Assessment Patient has had a recent decline in their functional status and demonstrates the ability to make significant improvements in function in a reasonable and predictable amount of time.      Precautions / Restrictions Precautions Precautions: Fall (no back precautions per verbal order from Dr. Colon 01/15/24) Required Braces or Orthoses:  (no brace needed orders) Restrictions Weight Bearing Restrictions Per Provider Order: No      Mobility  Bed Mobility Overal bed mobility: Needs Assistance Bed Mobility: Rolling, Sidelying to Sit Rolling: Supervision, Used rails Sidelying to sit: Supervision, HOB elevated, Used rails       General bed mobility comments: Pt used rails and needed extra time due to pain, supervision for safety    Transfers Overall transfer level: Needs assistance Equipment used: Rolling walker (2 wheels) Transfers: Sit to/from Stand Sit to Stand: Contact guard assist           General transfer comment: Cues for hand placement, CGA for safety    Ambulation/Gait Ambulation/Gait assistance: Contact guard assist Gait Distance (Feet): 150 Feet Assistive device: Rolling walker (2 wheels), None Gait Pattern/deviations: Step-through pattern, Decreased step length - right, Decreased step length - left, Decreased stride length, Decreased dorsiflexion - left, Decreased dorsiflexion - right, Shuffle Gait velocity: reduced Gait velocity interpretation: <1.31 ft/sec, indicative of household ambulator   General Gait Details: Pt takes slow, shuffling steps, preferring to utilize a RW due to feeling swimmy headed from the meds per pt. Pt able to ambulate short distances without UE support or LOB though. CGA for safety. VCs provided for improved feet clearance  Stairs Stairs: Yes Stairs assistance: Contact guard assist Stair Management: Two rails, Step to pattern, Forwards Number of Stairs: 2 General stair comments: Ascends with R good foot leading up and descends with L Bad foot  leading down as educated. No LOB utilizing bil rails for support, CGA for safety  Wheelchair Mobility     Tilt Bed    Modified Rankin (Stroke Patients Only)        Balance Overall balance assessment: Mild deficits observed, not formally tested                                           Pertinent Vitals/Pain Pain Assessment Pain Assessment: Faces Faces Pain Scale: Hurts little more Pain Location: back Pain Descriptors / Indicators: Discomfort, Grimacing, Operative site guarding Pain Intervention(s): Limited activity within patient's tolerance, Monitored during session, Premedicated before session, Repositioned    Home Living Family/patient expects to be discharged to:: Private residence Living Arrangements: Spouse/significant other Available Help at Discharge: Family;Available 24 hours/day Type of Home: House Home Access: Stairs to enter Entrance Stairs-Rails: None Entrance Stairs-Number of Steps: 1   Home Layout: One level Home Equipment: Cane - single point;Transport chair;Shower seat - built in;Grab bars - tub/shower      Prior Function Prior Level of Function : Independent/Modified Independent;History of Falls (last six months)             Mobility Comments: Intermittently holds onto furniture, x2 falls in past 6 months       Extremity/Trunk Assessment   Upper Extremity Assessment Upper Extremity Assessment: Overall WFL for tasks assessed    Lower Extremity Assessment Lower Extremity Assessment: RLE deficits/detail;LLE deficits/detail RLE Deficits / Details: hx of R leg getting weak spontaneously prior to surgery, currently grossly >/= 4+/5 bil; denied numbness/tingling LLE Deficits / Details: hx of L leg posterior pain prior to surgery, currently grossly >/= 4+/5 bil; denied numbness/tingling    Cervical / Trunk Assessment Cervical / Trunk Assessment: Back Surgery  Communication   Communication Communication: No apparent difficulties    Cognition Arousal: Alert Behavior During Therapy: WFL for tasks assessed/performed   PT - Cognitive impairments: No apparent impairments                          Following commands: Intact       Cueing Cueing Techniques: Verbal cues     General Comments General comments (skin integrity, edema, etc.): Educated pt on changing positions and ambulating frequently to tolerance and to use his RW at d/c as needed for safety and pain management until able to safely progress away from needing it. Pt verbalized understanding.    Exercises     Assessment/Plan    PT Assessment Patient needs continued PT services  PT Problem List Decreased activity tolerance;Decreased balance;Decreased mobility;Pain       PT Treatment Interventions DME instruction;Gait training;Stair training;Functional mobility training;Therapeutic activities;Therapeutic exercise;Balance training;Neuromuscular re-education;Patient/family education    PT Goals (Current goals can be found in the Care Plan section)  Acute Rehab PT Goals Patient Stated Goal: to improve PT Goal Formulation: With patient Time For Goal Achievement: 01/22/24 Potential to Achieve Goals: Good    Frequency Min 5X/week     Co-evaluation               AM-PAC PT 6 Clicks Mobility  Outcome Measure Help needed turning from your back to your side while in a flat bed without using bedrails?: A Little Help needed moving from lying on your back to sitting on the side of a flat bed without using bedrails?: A Little  Help needed moving to and from a bed to a chair (including a wheelchair)?: A Little Help needed standing up from a chair using your arms (e.g., wheelchair or bedside chair)?: A Little Help needed to walk in hospital room?: A Little Help needed climbing 3-5 steps with a railing? : A Little 6 Click Score: 18    End of Session Equipment Utilized During Treatment: Gait belt Activity Tolerance: Patient tolerated treatment well Patient left: in bed;with call bell/phone within reach (sitting EOB with MD) Nurse Communication: Mobility status;Other (comment) (DME needs for d/c) PT  Visit Diagnosis: Unsteadiness on feet (R26.81);Other abnormalities of gait and mobility (R26.89);Pain Pain - Right/Left:  (back) Pain - part of body:  (back)    Time: 9185-9160 PT Time Calculation (min) (ACUTE ONLY): 25 min   Charges:   PT Evaluation $PT Eval Low Complexity: 1 Low PT Treatments $Therapeutic Activity: 8-22 mins PT General Charges $$ ACUTE PT VISIT: 1 Visit         Theo Ferretti, PT, DPT Acute Rehabilitation Services  Office: [phone]   Theo CHRISTELLA Ferretti 01/15/2024, 8:55 AM

## 2024-01-15 NOTE — Progress Notes (Signed)
 Occupational Therapy Discharge Patient Details Name: [name] MRN: [id number] DOB: [birth year] Today's Date: 01/15/2024 Time:  -     Patient discharged from OT services secondary to screen no acute needs.  Please see latest therapy progress note for current level of functioning and progress toward goals.    Progress and discharge plan discussed with patient and/or caregiver: Patient/Caregiver agrees with plan  GO     Ely Molt 01/15/2024, 8:53 AM

## 2024-01-15 NOTE — Discharge Summary (Signed)
 Physician Discharge Summary  [name] MRN: [id number] DOB/AGE: [birth year] 78 y.o.  Admit date: 01/14/2024 Discharge date: 01/15/2024  Admission Diagnoses: Intractable back pain  Discharge Diagnoses: Intractable back pain.  Thoracic radiculopathy  Principal Problem:   Chronic intractable pain   Discharged Condition: good  Hospital Course: Patient was admitted to undergo placement of a spinal cord stimulator and a thoracic laminectomy performed to place a paddlepostoperatively he had significant abdominal cramping and actually believed to be related to thoracic radiculopathy from the laminectomy.  Patient has been started on Decadron  that seems to help mitigate symptoms.  Discharge home at this time follow-up Decadron  taper.  He is also using hydrocodone  for the back pain.  Consults: None  Significant Diagnostic Studies: None  Treatments: surgery: See operative  Discharge Exam: Blood pressure 136/69, pulse 100, temperature 98.4 F (36.9 C), temperature source Oral, resp. rate 17, height 5' 8 (1.727 m), weight 81.6 kg, SpO2 94%. Incision is clean and dry Station and gait are intact.  Disposition: Discharge disposition: 01-Home or Self Care       Discharge Instructions     Call MD for:  redness, tenderness, or signs of infection (pain, swelling, redness, odor or green/yellow discharge around incision site)   Complete by: As directed    Call MD for:  severe uncontrolled pain   Complete by: As directed    Call MD for:  temperature >100.4   Complete by: As directed    Diet - low sodium heart healthy   Complete by: As directed    Discharge instructions   Complete by: As directed    Okay to shower. Do not apply salves or appointments to incision. No heavy lifting with the upper extremities greater than 15 pounds. May resume driving when not requiring pain medication and patient feels comfortable with doing so.   Incentive spirometry RT   Complete by: As  directed    Increase activity slowly   Complete by: As directed    No wound care   Complete by: As directed       Allergies as of 01/15/2024       Reactions   Xylocaine  [lidocaine ] Anaphylaxis   Injection form   Codeine Nausea And Vomiting   Statins Other (See Comments)   Muscle soreness and an aching feeling all over Myalgias   Aldactone  [spironolactone ]    Sore nipples and chest   Alpha-gal    2015; has been able to re-introduce red meat for the past 2 1/2 years without reaction (as of 01/21/19)   Pirfenidone  Other (See Comments)   GI side effects   Percocet [oxycodone -acetaminophen ] Itching        Medication List     TAKE these medications    Acetaminophen  8 Hour 650 MG CR tablet Generic drug: acetaminophen  Take 1,300 mg by mouth in the morning.   cetirizine 10 MG tablet Commonly known as: ZYRTEC Take 10 mg by mouth daily as needed for allergies.   dexamethasone  1 MG tablet Commonly known as: DECADRON  2 tablets twice daily for 2 days, one tablet twice daily for 2 days, one tablet daily for 2 days.   Dialyvite Vitamin D  5000 125 MCG (5000 UT) capsule Generic drug: Cholecalciferol Take 5,000 Units by mouth in the morning.   diltiazem  120 MG 24 hr capsule Commonly known as: CARDIZEM  CD TAKE ONE CAPSULE BY MOUTH DAILY   diphenoxylate -atropine  2.5-0.025 MG tablet Commonly known as: LOMOTIL  Take 1 tablet by mouth 2 (two) times  daily as needed for diarrhea or loose stools.   Eliquis  5 MG Tabs tablet Generic drug: apixaban  TAKE ONE TABLET (5MG  TOTAL) BY MOUTH TWODAILY What changed: See the new instructions.   ezetimibe  10 MG tablet Commonly known as: ZETIA  TAKE ONE (1) TABLET BY MOUTH EVERY DAY   fluticasone  50 MCG/ACT nasal spray Commonly known as: FLONASE  Place 2 sprays into both nostrils daily as needed for allergies or rhinitis.   HYDROcodone -acetaminophen  5-325 MG tablet Commonly known as: NORCO/VICODIN Take 1-2 tablets by mouth every 4 (four)  hours as needed for moderate pain (pain score 4-6) or severe pain (pain score 7-10).   hydroxychloroquine  200 MG tablet Commonly known as: PLAQUENIL  Take 200 mg by mouth 2 (two) times daily.   metoprolol  tartrate 25 MG tablet Commonly known as: LOPRESSOR  TAKE ONE TABLET BY MOUTH TWICE A DAY   omeprazole  40 MG capsule Commonly known as: PRILOSEC TAKE ONE CAPSULE (40 MG TOTAL) BY MOUTH DAILY. What changed: See the new instructions.   ondansetron  8 MG tablet Commonly known as: ZOFRAN  Take 8 mg by mouth every 8 (eight) hours as needed for nausea or vomiting.   predniSONE  5 MG tablet Commonly known as: DELTASONE  Take 5 mg by mouth in the morning.   rosuvastatin  10 MG tablet Commonly known as: CRESTOR  TAKE ONE-HALF TABLET (5MG  TOTAL) BY MOUTH ONCE A WEEK What changed: See the new instructions.   tamsulosin  0.4 MG Caps capsule Commonly known as: FLOMAX  Take 0.4 mg by mouth 2 (two) times daily.         Signed: Victory PARAS Toren Tucholski 01/15/2024, 8:58 AM

## 2024-01-15 NOTE — Plan of Care (Signed)
 Pt doing well. Pt and wife given D/C instructions with verbal understanding. Rx's were sent to the pharmacy by MD. Pt's incision is clean and dry with no sign of infection. Pt's IV was removed prior to D/C. Pt D/C'd home via wheelchair per MD order. Pt received RW from [employer] prior to D/C. Pt is stable @ D/C and has no other needs at this time. Rosina Rakers, RN

## 2024-02-05 ENCOUNTER — Telehealth (INDEPENDENT_AMBULATORY_CARE_PROVIDER_SITE_OTHER): Payer: Self-pay | Admitting: Gastroenterology

## 2024-02-05 DIAGNOSIS — K63829 Intestinal methanogen overgrowth, unspecified: Secondary | ICD-10-CM

## 2024-02-05 NOTE — Telephone Encounter (Signed)
 Pt left voicemail stating he has problems with his intestines and was told when they come back to call and let us  know so we can order med for it, he is starting to fill issues Returned call to pt. Pt states he is having nausea (mainly in the morning when waking up and does not last all day), has cramps in stomach, diarrhea alternating with constipation due to IBS. Pt also stated that he wanted to get his meds through the [place] pharmacy. Advised pt I could give him the number to the [place] pharmacy because we can not sent meds directly to them. Pt states I talked to your doctor about it when I was there.  Please advise. Thank you

## 2024-02-06 MED ORDER — NEOMYCIN SULFATE 500 MG PO TABS: 500.0000 mg | ORAL_TABLET | Freq: Two times a day (BID) | ORAL | 0 refills | Status: AC

## 2024-02-06 MED ORDER — RIFAXIMIN 550 MG PO TABS: 550.0000 mg | ORAL_TABLET | Freq: Three times a day (TID) | ORAL | 0 refills | Status: DC

## 2024-02-06 NOTE — Telephone Encounter (Signed)
 PA for Xifaxan  completed via cover my meds. Await decision.

## 2024-02-06 NOTE — Addendum Note (Signed)
 Addended by: CINDERELLA DEATRICE SMILES on: 02/06/2024 08:43 AM   Modules accepted: Orders

## 2024-02-06 NOTE — Telephone Encounter (Signed)
 I have prescribed neomycin  and rifaximin  to his pharmacy for management of SIMO . Please advise him to follow up in clinic as well

## 2024-02-06 NOTE — Telephone Encounter (Signed)
 Pt contacted. Pt states his insurance will not cover the xifaxan . Advised pt I could do a prior auth and see if it will get covered or I can give him the information to the [place] pharmacy. Pt  states I dont know what to say right now but we can try the authorization and just do the best you can. Advised pt once I received the PA from the pharmacy, I will work on it and let him know the outcome. I asked pt if he wanted to go ahead and schedule his follow up. He said no, lets wait until I see if I can get the medication.

## 2024-02-07 NOTE — Telephone Encounter (Signed)
 Fax from [facility] [employer] received stating Xifaxan  has been denied due to being used for indication which is not approved or medically accepted: intestinal methanogen overgrowth and SIBO. Contacted pt and made him aware. Pt states it was denied because it was coded wrong. Pt wanted to know if there was any other medication. Advised pt Xifaxian is the only medication we can use to treat bacterial overgrowth. Advised pt I could initiate an appeal and pt would appreciate that. Contacted [employer] and initiated an appeal. Spoke with Bishop and he states we should here a decision on or before 02/14/24. Time spent initiating appeal 45 minutes.

## 2024-02-13 ENCOUNTER — Other Ambulatory Visit (INDEPENDENT_AMBULATORY_CARE_PROVIDER_SITE_OTHER): Payer: Self-pay

## 2024-02-13 DIAGNOSIS — K63829 Intestinal methanogen overgrowth, unspecified: Secondary | ICD-10-CM

## 2024-02-13 MED ORDER — RIFAXIMIN 550 MG PO TABS: 550.0000 mg | ORAL_TABLET | Freq: Three times a day (TID) | ORAL | 0 refills | Status: DC

## 2024-02-13 MED ORDER — METRONIDAZOLE 250 MG PO TABS: 250.0000 mg | ORAL_TABLET | Freq: Three times a day (TID) | ORAL | 0 refills | Status: AC

## 2024-02-13 MED ORDER — RIFAXIMIN 550 MG PO TABS: 550.0000 mg | ORAL_TABLET | Freq: Three times a day (TID) | ORAL | 0 refills | Status: AC

## 2024-02-13 NOTE — Telephone Encounter (Signed)
 Per message on 02/07/24:  Fax from [facility] received stating Xifaxan  has been denied due to being used for indication which is not approved or medically accepted: intestinal methanogen overgrowth and SIBO. Contacted pt and made him aware. Pt states it was denied because it was coded wrong. Pt wanted to know if there was any other medication. Advised pt Xifaxian is the only medication we can use to treat bacterial overgrowth. Advised pt I could initiate an appeal and pt would appreciate that. Contacted [employer] and initiated an appeal. Spoke with Bishop and he states we should here a decision on or before 02/14/24. Time spent initiating appeal 45 minutes.      Appeal has been completed, just waiting on insurance determination.

## 2024-02-13 NOTE — Telephone Encounter (Signed)
 Pt left voicemail in regards to getting information on [place] pharmacy that we send Xifaxan  to. Returned call to pt. Gave pt information and advised him he would have to call the pharmacy and pick up the prescription. I did advise pt that other patients have said it would be 4-6 weeks before getting the meds due to tarriffs. Pt states 6 weeks, ill be dead by then. Pt is wanting to know if there is anything else that can be done in the meantime. Pt states the nausea is what has him the most. Pt has been taking Imodium for diarrhea. I informed pt I could print the script out and have provider sign it and place it up front. Pt verbalized understanding. Please advise. Thank you!

## 2024-02-13 NOTE — Telephone Encounter (Signed)
 Pt contacted and made aware of Metronidazole  being called in. Script on provider desk for signature.

## 2024-02-13 NOTE — Addendum Note (Signed)
 Addended by: CINDERELLA DEATRICE SMILES on: 02/13/2024 01:41 PM   Modules accepted: Orders

## 2024-02-13 NOTE — Telephone Encounter (Signed)
 We can appeal for Xifaximin   We can also try second line for SIBO/IMO , metronidazole  which I will prescribe

## 2024-02-14 NOTE — Telephone Encounter (Signed)
 [name] , great work!!, in that case he should not take metronidazole  rather take only Xifaxan  and Neomycin 

## 2024-02-14 NOTE — Telephone Encounter (Signed)
 Noted. Pt has been informed to take Xifaxan  and Neomycin 

## 2024-02-14 NOTE — Telephone Encounter (Signed)
 Appeal has been APPROVED. Contacted [facility] [employer] and it went through for $0 copay. They only have a partial bottle but can call and reorder the rest to have on Monday. Contacted pt to let him know that he could pick up a partial bottle with no copay. Advised pt to begin Xifaxan  and Neomycin . Pt verbalized understanding.   Coverage start date 02/13/24 Coverage end date 02/21/24

## 2024-02-26 ENCOUNTER — Encounter (INDEPENDENT_AMBULATORY_CARE_PROVIDER_SITE_OTHER): Payer: Self-pay | Admitting: Gastroenterology

## 2024-03-16 DIAGNOSIS — Z85828 Personal history of other malignant neoplasm of skin: Secondary | ICD-10-CM | POA: Diagnosis not present

## 2024-03-16 DIAGNOSIS — Z08 Encounter for follow-up examination after completed treatment for malignant neoplasm: Secondary | ICD-10-CM | POA: Diagnosis not present

## 2024-03-16 DIAGNOSIS — X32XXXD Exposure to sunlight, subsequent encounter: Secondary | ICD-10-CM | POA: Diagnosis not present

## 2024-03-16 DIAGNOSIS — L570 Actinic keratosis: Secondary | ICD-10-CM | POA: Diagnosis not present

## 2024-04-06 DIAGNOSIS — M5136 Other intervertebral disc degeneration, lumbar region with discogenic back pain only: Secondary | ICD-10-CM | POA: Diagnosis not present

## 2024-04-06 DIAGNOSIS — J84112 Idiopathic pulmonary fibrosis: Secondary | ICD-10-CM | POA: Diagnosis not present

## 2024-04-06 DIAGNOSIS — S32010D Wedge compression fracture of first lumbar vertebra, subsequent encounter for fracture with routine healing: Secondary | ICD-10-CM | POA: Diagnosis not present

## 2024-04-06 DIAGNOSIS — Z6826 Body mass index (BMI) 26.0-26.9, adult: Secondary | ICD-10-CM | POA: Diagnosis not present

## 2024-04-06 DIAGNOSIS — Z7952 Long term (current) use of systemic steroids: Secondary | ICD-10-CM | POA: Diagnosis not present

## 2024-04-06 DIAGNOSIS — M8080XD Other osteoporosis with current pathological fracture, unspecified site, subsequent encounter for fracture with routine healing: Secondary | ICD-10-CM | POA: Diagnosis not present

## 2024-04-06 DIAGNOSIS — R7689 Other specified abnormal immunological findings in serum: Secondary | ICD-10-CM | POA: Diagnosis not present

## 2024-04-06 DIAGNOSIS — E663 Overweight: Secondary | ICD-10-CM | POA: Diagnosis not present

## 2024-04-16 DIAGNOSIS — M810 Age-related osteoporosis without current pathological fracture: Secondary | ICD-10-CM | POA: Diagnosis not present

## 2024-05-20 ENCOUNTER — Ambulatory Visit (INDEPENDENT_AMBULATORY_CARE_PROVIDER_SITE_OTHER)

## 2024-05-20 DIAGNOSIS — J849 Interstitial pulmonary disease, unspecified: Secondary | ICD-10-CM

## 2024-05-20 LAB — PULMONARY FUNCTION TEST
DL/VA % pred: 75 %
DL/VA: 3.02 ml/min/mmHg/L
DLCO unc % pred: 63 %
DLCO unc: 14.82 ml/min/mmHg
FEF 25-75 Pre: 2.86 L/s
FEF2575-%Pred-Pre: 143 %
FEV1-%Pred-Pre: 97 %
FEV1-Pre: 2.7 L
FEV1FVC-%Pred-Pre: 117 %
FEV6-%Pred-Pre: 87 %
FEV6-Pre: 3.17 L
FEV6FVC-%Pred-Pre: 107 %
FVC-%Pred-Pre: 82 %
FVC-Pre: 3.17 L
Pre FEV1/FVC ratio: 85 %
Pre FEV6/FVC Ratio: 100 %

## 2024-05-20 NOTE — Progress Notes (Signed)
Spiro/DLCO performed today. 

## 2024-05-20 NOTE — Patient Instructions (Signed)
Spiro/DLCO performed today. 

## 2024-06-10 ENCOUNTER — Ambulatory Visit: Admitting: Internal Medicine

## 2024-06-22 ENCOUNTER — Ambulatory Visit: Admitting: Adult Health
